# Patient Record
Sex: Male | Born: 1955 | Race: White | Hispanic: No | Marital: Married | Smoking: Never smoker
Health system: Southern US, Community
[De-identification: ages and names within clinical notes are randomized; demographics above are authoritative.]

## PROBLEM LIST (undated history)

## (undated) DIAGNOSIS — I209 Angina pectoris, unspecified: Secondary | ICD-10-CM

## (undated) DIAGNOSIS — J449 Chronic obstructive pulmonary disease, unspecified: Secondary | ICD-10-CM

## (undated) DIAGNOSIS — G4730 Sleep apnea, unspecified: Secondary | ICD-10-CM

## (undated) DIAGNOSIS — I4891 Unspecified atrial fibrillation: Secondary | ICD-10-CM

## (undated) DIAGNOSIS — I2510 Atherosclerotic heart disease of native coronary artery without angina pectoris: Secondary | ICD-10-CM

## (undated) DIAGNOSIS — I219 Acute myocardial infarction, unspecified: Secondary | ICD-10-CM

## (undated) DIAGNOSIS — M14669 Charcot's joint, unspecified knee: Secondary | ICD-10-CM

## (undated) DIAGNOSIS — I2699 Other pulmonary embolism without acute cor pulmonale: Secondary | ICD-10-CM

## (undated) DIAGNOSIS — I82409 Acute embolism and thrombosis of unspecified deep veins of unspecified lower extremity: Secondary | ICD-10-CM

## (undated) DIAGNOSIS — G629 Polyneuropathy, unspecified: Secondary | ICD-10-CM

## (undated) DIAGNOSIS — E785 Hyperlipidemia, unspecified: Secondary | ICD-10-CM

## (undated) DIAGNOSIS — R001 Bradycardia, unspecified: Secondary | ICD-10-CM

## (undated) DIAGNOSIS — Z9289 Personal history of other medical treatment: Secondary | ICD-10-CM

## (undated) DIAGNOSIS — I10 Essential (primary) hypertension: Secondary | ICD-10-CM

## (undated) DIAGNOSIS — E669 Obesity, unspecified: Secondary | ICD-10-CM

## (undated) DIAGNOSIS — R748 Abnormal levels of other serum enzymes: Secondary | ICD-10-CM

## (undated) DIAGNOSIS — I5030 Unspecified diastolic (congestive) heart failure: Secondary | ICD-10-CM

## (undated) DIAGNOSIS — M79606 Pain in leg, unspecified: Secondary | ICD-10-CM

## (undated) DIAGNOSIS — Z8719 Personal history of other diseases of the digestive system: Secondary | ICD-10-CM

## (undated) DIAGNOSIS — K219 Gastro-esophageal reflux disease without esophagitis: Secondary | ICD-10-CM

## (undated) HISTORY — PX: OTHER SURGICAL HISTORY: SHX169

## (undated) HISTORY — DX: Essential (primary) hypertension: I10

## (undated) HISTORY — DX: Personal history of other medical treatment: Z92.89

## (undated) HISTORY — DX: Other pulmonary embolism without acute cor pulmonale: I26.99

## (undated) HISTORY — DX: Polyneuropathy, unspecified: G62.9

## (undated) HISTORY — DX: Acute embolism and thrombosis of unspecified deep veins of unspecified lower extremity: I82.409

## (undated) HISTORY — PX: CORONARY ANGIOPLASTY WITH STENT PLACEMENT: SHX49

## (undated) HISTORY — DX: Charcot's joint, unspecified knee: M14.669

## (undated) HISTORY — PX: VASCULAR SURGERY: SHX849

## (undated) HISTORY — PX: CORONARY ARTERY BYPASS GRAFT: SHX141

## (undated) HISTORY — DX: Hyperlipidemia, unspecified: E78.5

## (undated) HISTORY — DX: Pain in leg, unspecified: M79.606

## (undated) HISTORY — DX: Atherosclerotic heart disease of native coronary artery without angina pectoris: I25.10

---

## 1991-02-14 DIAGNOSIS — E119 Type 2 diabetes mellitus without complications: Secondary | ICD-10-CM | POA: Insufficient documentation

## 2003-05-01 ENCOUNTER — Other Ambulatory Visit: Payer: Self-pay

## 2004-12-02 ENCOUNTER — Inpatient Hospital Stay: Payer: Self-pay | Admitting: Cardiology

## 2004-12-02 ENCOUNTER — Other Ambulatory Visit: Payer: Self-pay

## 2005-01-06 ENCOUNTER — Emergency Department: Payer: Self-pay | Admitting: Emergency Medicine

## 2007-05-30 ENCOUNTER — Inpatient Hospital Stay (HOSPITAL_COMMUNITY): Admission: EM | Admit: 2007-05-30 | Discharge: 2007-06-03 | Payer: Self-pay | Admitting: Emergency Medicine

## 2007-05-30 ENCOUNTER — Ambulatory Visit: Payer: Self-pay | Admitting: Cardiology

## 2007-05-30 ENCOUNTER — Ambulatory Visit: Payer: Self-pay

## 2007-05-31 ENCOUNTER — Encounter (INDEPENDENT_AMBULATORY_CARE_PROVIDER_SITE_OTHER): Payer: Self-pay | Admitting: Emergency Medicine

## 2007-06-05 ENCOUNTER — Ambulatory Visit: Payer: Self-pay | Admitting: Cardiology

## 2007-06-05 LAB — CONVERTED CEMR LAB
INR: 3.8 — ABNORMAL HIGH (ref 0.8–1.0)
Prothrombin Time: 38.8 s — ABNORMAL HIGH (ref 10.9–13.3)

## 2007-06-07 ENCOUNTER — Ambulatory Visit: Payer: Self-pay | Admitting: Internal Medicine

## 2007-06-12 ENCOUNTER — Ambulatory Visit: Payer: Self-pay | Admitting: Cardiology

## 2007-06-21 ENCOUNTER — Ambulatory Visit: Payer: Self-pay | Admitting: Cardiology

## 2007-06-25 ENCOUNTER — Ambulatory Visit: Payer: Self-pay | Admitting: Cardiology

## 2007-07-05 ENCOUNTER — Ambulatory Visit: Payer: Self-pay | Admitting: Cardiology

## 2007-07-29 ENCOUNTER — Ambulatory Visit: Payer: Self-pay | Admitting: Cardiology

## 2007-08-08 ENCOUNTER — Ambulatory Visit (HOSPITAL_COMMUNITY): Admission: RE | Admit: 2007-08-08 | Discharge: 2007-08-08 | Payer: Self-pay | Admitting: Cardiology

## 2007-08-12 ENCOUNTER — Ambulatory Visit: Payer: Self-pay | Admitting: Cardiology

## 2007-08-14 ENCOUNTER — Ambulatory Visit: Payer: Self-pay

## 2007-09-09 ENCOUNTER — Ambulatory Visit: Payer: Self-pay | Admitting: Cardiology

## 2007-09-23 ENCOUNTER — Ambulatory Visit: Payer: Self-pay | Admitting: Cardiology

## 2007-10-15 ENCOUNTER — Ambulatory Visit: Payer: Self-pay | Admitting: Cardiovascular Disease

## 2007-11-04 ENCOUNTER — Ambulatory Visit: Payer: Self-pay | Admitting: Cardiology

## 2007-12-10 ENCOUNTER — Ambulatory Visit: Payer: Self-pay | Admitting: Cardiovascular Disease

## 2007-12-16 ENCOUNTER — Ambulatory Visit: Payer: Self-pay | Admitting: Cardiology

## 2007-12-31 ENCOUNTER — Ambulatory Visit: Payer: Self-pay | Admitting: Cardiology

## 2008-01-21 ENCOUNTER — Ambulatory Visit: Payer: Self-pay | Admitting: Cardiology

## 2008-02-17 ENCOUNTER — Ambulatory Visit: Payer: Self-pay | Admitting: Cardiovascular Disease

## 2008-03-03 ENCOUNTER — Ambulatory Visit: Payer: Self-pay | Admitting: Cardiology

## 2008-03-05 ENCOUNTER — Ambulatory Visit: Payer: Self-pay | Admitting: Cardiology

## 2008-03-11 ENCOUNTER — Ambulatory Visit: Payer: Self-pay

## 2008-03-11 ENCOUNTER — Ambulatory Visit: Payer: Self-pay | Admitting: Cardiology

## 2008-03-11 LAB — CONVERTED CEMR LAB
ALT: 21 units/L (ref 0–53)
AST: 21 units/L (ref 0–37)
Albumin: 3.5 g/dL (ref 3.5–5.2)
Alkaline Phosphatase: 43 units/L (ref 39–117)
Bilirubin, Direct: 0.2 mg/dL (ref 0.0–0.3)
Cholesterol: 109 mg/dL (ref 0–200)
HDL: 30.1 mg/dL — ABNORMAL LOW (ref >39.0)
LDL Cholesterol: 58 mg/dL (ref 0–99)
Total Bilirubin: 0.7 mg/dL (ref 0.3–1.2)
Total CHOL/HDL Ratio: 3.6
Total Protein: 7 g/dL (ref 6.0–8.3)
Triglycerides: 105 mg/dL (ref 0–149)
VLDL: 21 mg/dL (ref 0–40)

## 2008-03-17 ENCOUNTER — Ambulatory Visit: Payer: Self-pay | Admitting: Cardiovascular Disease

## 2008-04-22 ENCOUNTER — Ambulatory Visit: Payer: Self-pay | Admitting: Internal Medicine

## 2008-05-12 ENCOUNTER — Ambulatory Visit: Payer: Self-pay | Admitting: Cardiovascular Disease

## 2008-05-26 DIAGNOSIS — E785 Hyperlipidemia, unspecified: Secondary | ICD-10-CM | POA: Insufficient documentation

## 2008-05-26 DIAGNOSIS — I119 Hypertensive heart disease without heart failure: Secondary | ICD-10-CM | POA: Insufficient documentation

## 2008-06-09 ENCOUNTER — Ambulatory Visit: Payer: Self-pay | Admitting: Cardiology

## 2008-06-18 ENCOUNTER — Ambulatory Visit: Payer: Self-pay | Admitting: Cardiology

## 2008-06-25 ENCOUNTER — Telehealth: Payer: Self-pay | Admitting: Cardiology

## 2008-06-30 ENCOUNTER — Ambulatory Visit: Payer: Self-pay | Admitting: Cardiology

## 2008-07-20 ENCOUNTER — Ambulatory Visit: Payer: Self-pay | Admitting: Cardiovascular Disease

## 2008-07-20 ENCOUNTER — Encounter: Payer: Self-pay | Admitting: Cardiology

## 2008-07-20 LAB — CONVERTED CEMR LAB
AntiThromb III Func: 83 % (ref 76–126)
Anticardiolipin IgA: 10 (ref <13)
Anticardiolipin IgG: 7 (ref <11)
Anticardiolipin IgM: <7 (ref <10)
Homocysteine: 11.9 micromoles/L (ref 4.0–15.4)
Protein S Ag, Total: 94 % (ref 70–140)

## 2008-09-08 ENCOUNTER — Encounter (INDEPENDENT_AMBULATORY_CARE_PROVIDER_SITE_OTHER): Payer: Self-pay | Admitting: *Deleted

## 2008-09-23 ENCOUNTER — Encounter: Payer: Self-pay | Admitting: Cardiology

## 2008-09-28 ENCOUNTER — Encounter: Payer: Self-pay | Admitting: *Deleted

## 2008-11-24 ENCOUNTER — Telehealth: Payer: Self-pay | Admitting: Cardiology

## 2009-07-20 ENCOUNTER — Ambulatory Visit: Payer: Self-pay | Admitting: Cardiology

## 2009-07-20 DIAGNOSIS — E875 Hyperkalemia: Secondary | ICD-10-CM | POA: Insufficient documentation

## 2010-01-05 ENCOUNTER — Telehealth: Payer: Self-pay | Admitting: Cardiology

## 2010-01-11 ENCOUNTER — Ambulatory Visit: Payer: Self-pay | Admitting: Cardiology

## 2010-02-15 ENCOUNTER — Ambulatory Visit: Admit: 2010-02-15 | Payer: Self-pay | Admitting: Cardiology

## 2010-03-07 ENCOUNTER — Encounter: Payer: Self-pay | Admitting: Cardiology

## 2010-03-07 ENCOUNTER — Telehealth: Payer: Self-pay | Admitting: Cardiology

## 2010-03-11 ENCOUNTER — Encounter: Payer: Self-pay | Admitting: Cardiology

## 2010-03-15 NOTE — Assessment & Plan Note (Signed)
Summary: rov. appt 4:45/ gd   Visit Type:  Follow-up Primary Provider:  Ramonita Lab MD  CC:  abdominal pain.  History of Present Illness: The patient presents for evaluation of abdominal discomfort. He says that he was doing some work and twisted in an abnormal way. He thought something. He has noticed some "lumps" under the skin in his right upper quadrant. These have been somewhat deep and firm without discoloration. He was worried that he could have cancer. He has not been having any nausea, vomiting or weight loss. He has had no change in his bowel habits or evidence of GI bleeding. He has had no chest pressure, neck or arm discomfort. He has had no palpitations, presyncope or syncope. His lower extremity edema is actually improved compared with previous.  Current Medications (verified): 1)  Crestor 10 Mg Tabs (Rosuvastatin Calcium) .... Take 1 Tablet By Mouth Once A Day 2)  Metoprolol Succinate 25 Mg Xr24h-Tab (Metoprolol Succinate) .... 1/2 Tablet By Mouth Every Day 3)  Warfarin Sodium 10 Mg Tabs (Warfarin Sodium) .... Take 1 Tablet By Mouth Once A Day 4)  Lantus 100 Unit/ml Soln (Insulin Glargine) .... Uad 5)  Hydrochlorothiazide 25 Mg Tabs (Hydrochlorothiazide) .... Take One Tablet By Mouth Daily. 6)  Humalog 100 Unit/ml Soln (Insulin Lispro (Human)) .... Uad 7)  Cozaar 50 Mg Tabs (Losartan Potassium) .... Take One Tablet By Mouth Daily 8)  Fish Oil   Oil (Fish Oil) .Marland Kitchen.. 1 Tab Two Times A Day 9)  Nitroglycerin 0.4 Mg Subl (Nitroglycerin) .... One Tablet Under Tongue Every 5 Minutes As Needed For Chest Pain---May Repeat Times Three 10)  Nexium 40 Mg Cpdr (Esomeprazole Magnesium) .... As Needed  Allergies (verified): 1)  ! * Bee Stings  Past History:  Past Medical History:    Reviewed history from 05/26/2008 and no changes required:     1. Coronary artery disease status post CABG (I do not have the full          details of this.  He had an occluded saphenous vein graft to OM,        PDA after CABG.  He had a Cypher stent to the right coronary          artery.  This is apparently a 4-vessel CABG in 2002).      2. Hypertension x15 years.      3. Dyslipidemia.      4. Diabetes mellitus.      5. Peripheral neuropathy.      6. Charcot joint.      7. Pulmonary embolism.      8. Right lower extremity deep venous thrombosis.   Past Surgical History:    Reviewed history from 05/26/2008 and no changes required:    Left knee surgery    CABG times 4 in 2002.   Review of Systems       As stated in the HPI and negative for all other systems.   Vital Signs:  Patient profile:   55 year old male Height:      72 inches Weight:      263 pounds BMI:     35.80 Pulse rate:   62 / minute BP sitting:   124 / 80  (right arm)  Vitals Entered By: Margaretmary Bayley CMA (Jun 30, 2008 5:17 PM)  Physical Exam  General:  Well developed, well nourished, in no acute distress. Head:  normocephalic and atraumatic Eyes:  PERRLA/EOM intact; conjunctiva and lids normal. Mouth:  Teeth, gums and palate normal. Oral mucosa normal. Neck:  Neck supple, no JVD. No masses, thyromegaly or abnormal cervical nodes. Chest Wall:  Well healed sternotomy scar Lungs:  Clear bilaterally to auscultation and percussion. Abdomen:  Bowel sounds positive; abdomen soft and non-tender without masses, organomegaly, or hernias noted. No hepatosplenomegaly, morbidly obese Msk:  Back normal, normal gait. Muscle strength and tone normal. Pulses:  pulses normal in all 4 extremities Extremities:  chronic lower extremity venous stasis changes, mild bilateral edema Neurologic:  Alert and oriented x 3. Skin:  Intact without lesions or rashes. Cervical Nodes:  no significant adenopathy Axillary Nodes:  no significant adenopathy Inguinal Nodes:  no significant adenopathy Psych:  Normal affect.   Detailed Cardiovascular Exam  Neck    Carotids: Carotids full and equal bilaterally without bruits.      Neck Veins:  Normal, no JVD.    Heart    Inspection: no deformities or lifts noted.      Palpation: normal PMI with no thrills palpable.      Auscultation: regular rate and rhythm, S1, S2 without murmurs, rubs, gallops, or clicks.    Vascular    Abdominal Aorta: no palpable masses, pulsations, or audible bruits.      Femoral Pulses: normal femoral pulses bilaterally.      Pedal Pulses: normal pedal pulses bilaterally.      Radial Pulses: normal radial pulses bilaterally.      Peripheral Circulation: no clubbing, cyanosis, or edema noted with normal capillary refill.     Impression & Recommendations:  Problem # 1:  ABDOMINAL PAIN, UNSPECIFIED SITE (ICD-789.00) The patient has abdominal pain related to "pulling something" when he was doing some work. I do not appreciate any subcutaneous nodules, hematoma, ecchymosis or other abdominal findings. I suspect he may hold the muscle leaving a slight bleed since he is on Coumadin. However, this should get better with time. If not I have asked him to discuss this Dr.Klein.  Problem # 2:  DVT (ICD-453.40) The patient has now been on Coumadin for over a year and can discontinue this. I will check a hypercoagulable panel.  Problem # 3:  CAD (ICD-414.00) He had a negative stress perfusion study in January. He will continue with risk reduction. Orders: EKG w/ Interpretation (93000)  Problem # 4:  DYSLIPIDEMIA (ICD-272.4) Per his primary care doctor. The goal should be an LDL less than 70 and HDL greater than 40. He is not at target with his HDL but had an LDL of 58 earlier this year. He understands the need for more exercise to raise his HDL. His updated medication list for this problem includes:    Crestor 10 Mg Tabs (Rosuvastatin calcium) .Marland Kitchen... Take 1 tablet by mouth once a day  Problem # 5:  HYPERTENSION (ICD-401.9) His blood pressure is controlled and he will continue the medications as listed.  Patient Instructions: 1)  Your physician recommends that  you schedule a follow-up appointment in: 4 months 2)  Your physician recommends that you return for lab work in:  1 week have a hypercoag panel 3)  Your physician has recommended you make the following change in your medication: Stop Coumadin

## 2010-03-15 NOTE — Letter (Signed)
Summary: Appointment - Reminder [facility], [place]  [address] [place]   [place], [place] [address]   Phone: [phone]  Fax: [phone]     September 08, 2008 MRN: [id number]   [facility] [address] [place], [place]  [address]   Dear Mr. MOGA,  [facility] records indicate that it is time to schedule a follow-up appointment.  Dr.HOCHREIN recommended that you follow up with [place] in SEPTEMBER 2010. It is very important that we reach you to schedule this appointment. We look forward to participating in your health care needs. Please contact us at the number listed above at your earliest convenience to schedule your appointment.  If you are unable to make an appointment at this time, give [place] a call so we can update our records.     Sincerely, GESILA,DAVIS  Public relations account executive

## 2010-03-15 NOTE — Progress Notes (Signed)
Summary: rx metoprolol  Phone Note Call from Patient Call back at [phone]   Caller: Patient Reason for Call: Talk to Nurse Summary of Call: talk to nurse about meds... Initial call taken by: Darnell Level,  November 24, 2008 2:32 PM  Follow-up for Phone Call        ? about metoprolol,  metoprolol 25mg  daily sent to [place], CNA  November 24, 2008 2:54 PM  Follow-up by: Levora Angel, CNA,  November 24, 2008 2:54 PM    New/Updated Medications: METOPROLOL SUCCINATE 25 MG XR24H-TAB (METOPROLOL SUCCINATE) 1 tablet by mouth every day Prescriptions: METOPROLOL SUCCINATE 25 MG XR24H-TAB (METOPROLOL SUCCINATE) 1 tablet by mouth every day  #30 x 2   Entered by:   Levora Angel, CNA   Authorized by:   Minus Breeding, MD, [facility]   Signed by:   Levora Angel, CNA on 11/24/2008   Method used:   Electronically to        [facility] [phone]* (retail)       [place], [place]  [address]       Ph: [phone]       Fax: [phone]   RxID:   [id number]

## 2010-03-15 NOTE — Assessment & Plan Note (Signed)
Summary: 9 vitamin   Visit Type:  Follow-up Primary Provider:  Ramonita Lab MD  CC:  CAD.  History of Present Illness: The patient presents for followup of his known coronary disease. Since I last saw him he was taken off of Coumadin after a hypercoagulable panel came back with no abnormalities. He's had no further problems with obvious clotting though he has continued right lower extremity swelling that comes and goes. It is much proved compared with the presentation at which time he was found to have a DVT.  He  Did have one episode ofchest discomfort about 2 months ago that required a couple of nitroglycerin. Otherwise he is no longer having the chest discomfort that he had at time of bypass and stenting. His last catheterization in 2006. He has had no chest pressure, neck or arm discomfort. He is not having palpitations, presyncope or syncope. He is not having any PND or orthopnea. Unfortunately he says his sugars have been high and he hasn't seen his endocrinologist recently. He actually stopped taking Crestor when he ran out about 4 weeks ago. He doesn't know her lipid profile. He thinks he has lost a few pounds. He is working but doesn't exercise. He is not obviously watching his diet.   Current Medications (verified): 1)  Crestor 10 Mg Tabs (Rosuvastatin Calcium) .... Out 2)  Metoprolol Succinate 25 Mg Xr24h-Tab (Metoprolol Succinate) .Marland Kitchen.. 1 Tablet By Mouth Every Day 3)  Lantus 100 Unit/ml Soln (Insulin Glargine) .... Uad 4)  Hydrochlorothiazide 25 Mg Tabs (Hydrochlorothiazide) .... Take One Tablet By Mouth Daily. 5)  Humalog 100 Unit/ml Soln (Insulin Lispro (Human)) .... Uad 6)  Cozaar 50 Mg Tabs (Losartan Potassium) .... Take One Tablet By Mouth Daily 7)  Fish Oil   Oil (Fish Oil) .Marland Kitchen.. 1 Tab Two Times A Day 8)  Nitroglycerin 0.4 Mg Subl (Nitroglycerin) .... One Tablet Under Tongue Every 5 Minutes As Needed For Chest Pain---May Repeat Times Three 9)  Nexium 40 Mg Cpdr (Esomeprazole  Magnesium) .... As Needed  Allergies (verified): 1)  ! * Bee Stings  Past History:  Past Medical History: Reviewed history from 05/26/2008 and no changes required.  1. Coronary artery disease status post CABG (I do not have the full       details of this.  He had an occluded saphenous vein graft to OM,       PDA after CABG.  He had a Cypher stent to the right coronary       artery.  This is apparently a 4-vessel CABG in 2002).   2. Hypertension x15 years.   3. Dyslipidemia.   4. Diabetes mellitus.   5. Peripheral neuropathy.   6. Charcot joint.   7. Pulmonary embolism.   8. Right lower extremity deep venous thrombosis.   Past Surgical History: Reviewed history from 05/26/2008 and no changes required. Left knee surgery CABG times 4 in 2002.   Review of Systems       As stated in the HPI and negative for all other systems.   Vital Signs:  Patient profile:   55 year old male Height:      72 inches Weight:      263 pounds BMI:     35.80 Pulse rate:   63 / minute Resp:     18 per minute BP sitting:   138 / 84  (right arm)  Vitals Entered By: Levora Angel, CNA (July 20, 2009 10:47 AM)  Physical Exam  General:  Well developed, well nourished, in no acute distress. Head:  normocephalic and atraumatic Eyes:  PERRLA/EOM intact; conjunctiva and lids normal. Mouth:  Teeth, gums and palate normal. Oral mucosa normal. Neck:  Neck supple, no JVD. No masses, thyromegaly or abnormal cervical nodes. Chest Wall:  Well healed sternotomy scar Lungs:  Clear bilaterally to auscultation and percussion. Abdomen:  Bowel sounds positive; abdomen soft and non-tender without masses, organomegaly, or hernias noted. No hepatosplenomegaly, morbidly obese Msk:  Back normal, normal gait. Muscle strength and tone normal. Extremities:  chronic lower extremity venous stasis changes, mild bilateral edema Neurologic:  Alert and oriented x 3. Skin:  Intact without lesions or rashes. Cervical Nodes:   no significant adenopathy Axillary Nodes:  no significant adenopathy Psych:  Normal affect.   Detailed Cardiovascular Exam  Neck    Carotids: Carotids full and equal bilaterally without bruits.      Neck Veins: Normal, no JVD.    Heart    Inspection: no deformities or lifts noted.      Palpation: normal PMI with no thrills palpable.      Auscultation: regular rate and rhythm, S1, S2 without murmurs, rubs, gallops, or clicks.    Vascular    Abdominal Aorta: no palpable masses, pulsations, or audible bruits.      Femoral Pulses: normal femoral pulses bilaterally.      Pedal Pulses: pulses normal in all 4 extremities    Radial Pulses: normal radial pulses bilaterally.      Peripheral Circulation: no clubbing, cyanosis, or edema noted with normal capillary refill.     EKG  Procedure date:  07/20/2009  Findings:      Sinus rhythm, rate 65,axis within normal limits, intervals within normal limits, old inferior infarct, no acute ST-T wave changes.  Impression & Recommendations:  Problem # 1:  CAD (ICD-414.00) The patient has had no new symptoms. No further cardiovascular testing is suggested. He needs better secondary risk reduction. Orders: EKG w/ Interpretation (93000)  Problem # 2:  DYSLIPIDEMIA (ICD-272.4) I will restart his Crestor. I have ordered a lipid profile in 8 weeks. The goal with his ongoing risk factors should be an LDL less than 70 and HDL greater than 40. Orders: EKG w/ Interpretation (93000)  Problem # 3:  HYPERTENSION (ICD-401.9) His blood pressure is controlled and he will continue the meds as listed. Orders: EKG w/ Interpretation (93000)  Problem # 4:  DM (ICD-250.00) I have encouraged him to followup and get an appointment with his endocrinologist and described to him the importance of better blood sugar control.  Problem # 5:  OBESITY, UNSPECIFIED (ICD-278.00) I have discussed with him the importance of weight loss again.  Patient  Instructions: 1)  Your physician recommends that you schedule a follow-up appointment in:  2)  Your physician has recommended you make the following change in your medication: restart Crestor 3)  Your physician recommends that you have a FASTING lipid profile: in 8 weeks  ( may have done in [place]) Prescriptions: CRESTOR 10 MG TABS (ROSUVASTATIN CALCIUM) one daily  #30 x 11   Entered by:   Sim Boast, RN   Authorized by:   Minus Breeding, MD, [facility]   Signed by:   Sim Boast, RN on 07/20/2009   Method used:   Electronically to        [facility] [phone]* (retail)       [place], [place]  [address]  Ph: [phone]       Fax: [phone]   RxIDNF:[id number]

## 2010-03-15 NOTE — Letter (Signed)
Summary: [facility] Note  [facility] Note   Imported By: Sallee Provencal 10/15/2008 15:14:40  _____________________________________________________________________  External Attachment:    Type:   Image     Comment:   External Document

## 2010-03-15 NOTE — Progress Notes (Signed)
Summary: PT REQUEST CALL  Phone Note Call from Patient Call back at Home Phone [phone] Call back at [phone] Message from:  Patient on January 05, 2010 8:34 AM  Caller: Patient Reason for Call: Talk to Nurse Initial call taken by: Delsa Sale,  January 05, 2010 8:36 AM  Follow-up for Phone Call        I called and spoke with the pt. He thought he was due for an appt with Dr. Percival Spanish. I explained he is not overdue- he is not due until 6/12. He states he would like to go ahead and schedule an appt with Dr. Percival Spanish to discuss his medications. He was offered a first available on 02/08/10, but preferred to wait until the new year. He is scheduled for 02/15/10. The pt states he stopped Crestor about a month or two ago due to muscle soreness. He has only noticed a little improvement in this. He is also concerned about some of the blood pressure medications he is taking, but the reason why is a little unclear to me after speaking with him. He is overdue for labwork. He will go to the [place] office next week for followup labwork to see what his lipid status is. We will also check a CK level and bmet. The pt is agreeable with this plan. Follow-up by: Alvis Lemmings, RN, BSN,  January 05, 2010 8:46 AM     Appended Document: PT REQUEST CALL    Clinical Lists Changes  Medications: Rx of COZAAR 50 MG TABS (LOSARTAN POTASSIUM) Take one tablet by mouth daily;  #30 x 6;  Signed;  Entered by: Alvis Lemmings, RN, BSN;  Authorized by: Minus Breeding, MD, [facility];  Method used: Electronically to [facility] [phone]*, [address] [place], [place]  [address], Ph: [phone], Fax: [phone]    Prescriptions: COZAAR 50 MG TABS (LOSARTAN POTASSIUM) Take one tablet by mouth daily  #30 x 6   Entered by:   Alvis Lemmings, RN, BSN   Authorized by:   Minus Breeding, MD, [facility]   Signed by:   Alvis Lemmings, RN, BSN on 01/05/2010   Method used:   Electronically to        [facility] [phone]* (retail)       [place], [place]  [address]       Ph: [phone]       Fax: [phone]   RxID:   [id number]

## 2010-03-17 NOTE — Progress Notes (Signed)
Summary: PT [place]  Phone Note Call from Patient Call back at Home Phone [phone] Call back at CE[facility] [phone]   Caller: Patient Reason for Call: Talk to Nurse Summary of Call: PT [place] Initial call taken by: Regan Lemming,  March 07, 2010 3:43 PM  Follow-up for Phone Call        pt requesting orders for bloodwork be faxed to [facility] at [phone].  Orders for BMP,CK and fasting lipid to be faxed Follow-up by: Sim Boast, RN,  March 07, 2010 4:23 PM

## 2010-03-17 NOTE — Letter (Signed)
Summary: lab order bmp, ck and lipid  pfh,rn  [facility] [place]  [address] [place]   [place], [place] [address]   Phone: [phone]  Fax: [phone]            March 07, 2010 MRN: [id number]    RE:   Bobby Lindsey   [address]   [place], [place]  [address]   DOB 1955-07-19    Bobby Lindsey needs a fasting lipid panel, CK and BMP for 272.2, 401.9 and v58.69 with results faxed to [phone] ATTN:  Pam         Sincerely,      V.O. Dr Jeneen Rinks Yamila Cragin/Pamela Fleming-Hayes, RN   This letter has been electronically signed by your physician.

## 2010-03-21 ENCOUNTER — Ambulatory Visit (INDEPENDENT_AMBULATORY_CARE_PROVIDER_SITE_OTHER): Payer: PRIVATE HEALTH INSURANCE | Admitting: Cardiology

## 2010-03-21 ENCOUNTER — Encounter: Payer: Self-pay | Admitting: Cardiology

## 2010-03-21 DIAGNOSIS — E669 Obesity, unspecified: Secondary | ICD-10-CM

## 2010-03-21 DIAGNOSIS — E7800 Pure hypercholesterolemia, unspecified: Secondary | ICD-10-CM

## 2010-03-21 DIAGNOSIS — I2510 Atherosclerotic heart disease of native coronary artery without angina pectoris: Secondary | ICD-10-CM

## 2010-03-30 ENCOUNTER — Other Ambulatory Visit (HOSPITAL_COMMUNITY): Payer: PRIVATE HEALTH INSURANCE

## 2010-03-31 ENCOUNTER — Telehealth (INDEPENDENT_AMBULATORY_CARE_PROVIDER_SITE_OTHER): Payer: Self-pay | Admitting: Radiology

## 2010-03-31 NOTE — Assessment & Plan Note (Signed)
Summary: rov/PER PT CALL/REQUEST APPT TO DISCUSS MEDS/ SEE 01/05/10 PH...   Visit Type:  Follow-up Primary Provider:  Ramonita Lab MD  CC:  CAD.  History of Present Illness: The patient presents for follow up of his known coronary disease. Since I last saw him he said no acute complaints. However, it is very clear that he does not participate in secondary risk reduction. We went through his diet and last night he had a toasted cheese sandwich and lemon pie. He does not report any chest pressure, neck or arm discomfort. He does not report palpitations, presyncope or syncope. He has had no weight gain or edema. However, he is not overly active. He has long-standing diabetes and thinks that his last hemoglobin A1c was 8. I did review his last lipid done in January and his LDL was 100 with an HDL of 47. This was off of Crestor which was stopped because his CK was elevated. Actually his CK in January was elevated despite not being on the Crestor for 6 months. He does describe a muscle and joint aches but these have been long term.  Current Medications (verified): 1)  Metoprolol Succinate 25 Mg Xr24h-Tab (Metoprolol Succinate) .Marland Kitchen.. 1 Tablet By Mouth Every Day 2)  Lantus 100 Unit/ml Soln (Insulin Glargine) .... Uad 3)  Hydrochlorothiazide 25 Mg Tabs (Hydrochlorothiazide) .... Take One Tablet By Mouth Daily. 4)  Humalog 100 Unit/ml Soln (Insulin Lispro (Human)) .... Uad 5)  Cozaar 50 Mg Tabs (Losartan Potassium) .... Take One Tablet By Mouth Daily 6)  Fish Oil   Oil (Fish Oil) .Marland Kitchen.. 1 Tab Two Times A Day 7)  Nitroglycerin 0.4 Mg Subl (Nitroglycerin) .... One Tablet Under Tongue Every 5 Minutes As Needed For Chest Pain---May Repeat Times Three 8)  Nexium 40 Mg Cpdr (Esomeprazole Magnesium) .... As Needed 9)  Mucinex Dm 30-600 Mg Xr12h-Tab (Dextromethorphan-Guaifenesin) .... Uad 10)  Benzonatate 200 Mg Caps (Benzonatate) .... As Needed  Allergies (verified): 1)  ! * Bee Stings  Past History:  Past  Medical History: Reviewed history from 05/26/2008 and no changes required.  1. Coronary artery disease status post CABG (I do not have the full       details of this.  He had an occluded saphenous vein graft to OM,       PDA after CABG.  He had a Cypher stent to the right coronary       artery.  This is apparently a 4-vessel CABG in 2002).   2. Hypertension x15 years.   3. Dyslipidemia.   4. Diabetes mellitus.   5. Peripheral neuropathy.   6. Charcot joint.   7. Pulmonary embolism.   8. Right lower extremity deep venous thrombosis.   Past Surgical History: Reviewed history from 05/26/2008 and no changes required. Left knee surgery CABG times 4 in 2002.   Review of Systems       As stated in the HPI and negative for all other systems except mild dizziness which she has noted since starting over-the-counter cold medicines for the last few days.   Vital Signs:  Patient profile:   55 year old male Height:      72 inches Weight:      266 pounds BMI:     36.21 Pulse rate:   66 / minute Pulse (ortho):   64 / minute Resp:     16 per minute BP sitting:   138 / 80  (right arm) BP standing:   127 / 75  Vitals  Entered By: Levora Angel, CNA (March 21, 2010 3:12 PM)  Serial Vital Signs/Assessments:  Time      Position  BP       Pulse  Resp  Temp     By 4:41 PM   Lying LA  139/82   60                    Thompson Grayer, RN, BSN 4:41 PM   Sitting   121/76   63                    Thompson Grayer, RN, BSN 4:41 PM   Standing  127/75   Blanco, RN, BSN  Comments: 4:41 PM Dizzy when going from sitting to standing.  Dizziness continues when standing. After standing 2 minutes--133/78-66. After standing 5 minutes--132/73--66 By: Thompson Grayer, RN, BSN    Physical Exam  General:  Well developed, well nourished, in no acute distress. Head:  normocephalic and atraumatic Eyes:  PERRLA/EOM intact; conjunctiva and lids normal. Mouth:  Teeth,  gums and palate normal. Oral mucosa normal. Neck:  Neck supple, no JVD. No masses, thyromegaly or abnormal cervical nodes. Chest Wall:  Well healed sternotomy scar Lungs:  Clear bilaterally to auscultation and percussion. Abdomen:  Bowel sounds positive; abdomen soft and non-tender without masses, organomegaly, or hernias noted. No hepatosplenomegaly, morbidly obese Msk:  Back normal, normal gait. Muscle strength and tone normal. Skin:  Intact without lesions or rashes. Cervical Nodes:  no significant adenopathy Axillary Nodes:  no significant adenopathy Inguinal Nodes:  no significant adenopathy Psych:  Normal affect.   Detailed Cardiovascular Exam  Neck    Carotids: Carotids full and equal bilaterally without bruits.      Neck Veins: Normal, no JVD.    Heart    Inspection: no deformities or lifts noted.      Palpation: normal PMI with no thrills palpable.      Auscultation: regular rate and rhythm, S1, S2 without murmurs, rubs, gallops, or clicks.    Vascular    Abdominal Aorta: no palpable masses, pulsations, or audible bruits.      Femoral Pulses: normal femoral pulses bilaterally.      Pedal Pulses: pulses normal in all 4 extremities    Radial Pulses: normal radial pulses bilaterally.      Peripheral Circulation: no clubbing, cyanosis, or edema noted with normal capillary refill.     EKG  Procedure date:  03/21/2010  Findings:      Sus rhythm, rate 67, axis within normal limits, intervals within normal limits, Lateral ST depression in one and aVL, consider ischemia  Impression & Recommendations:  Problem # 1:  CAD (ICD-414.00) The patient has coronary disease with 56 year old bypass grafts. He does not participate personally in risk reduction. He is relatively sedentary. Given all of this he needs to be followed and screened with a stress test. Unfortunately he could not walk on a treadmill with his neuropathy and Charcot joints. Therefore, he will need a pharmacologic  perfusion study. He does have lateral T wave inversions with an abnormal EKG Orders: EKG w/ Interpretation (93000) Nuclear Stress Test (Nuc Stress Test)  Problem # 2:  OBESITY, UNSPECIFIED (ICD-278.00) We had another long discussion about this and I gave cystic recommendations on diet  Problem # 3:  HYPERTENSION (ICD-401.9) His blood  pressure is controlled and he will continue medications as listed.  Problem # 4:  DYSLIPIDEMIA (ICD-272.4) he is no longer taking the Crestor because of muscle aches. I would very much like him to be on a statin with an LDL less than 70. I might prescribe pravastatin but I will check a CK level first. I don't think he can be on this if his CK is still elevated.  Patient Instructions: 1)  Your physician recommends that you schedule a follow-up appointment in: 6 months 2)  Your physician has requested that you have an adenosine myoview.  For further information please visit [email or web address].  Please follow instruction sheet, as given. 3)  Your physician recommends that you continue on your current medications as directed. Please refer to the Current Medication list given to you today.

## 2010-04-04 ENCOUNTER — Encounter: Payer: Self-pay | Admitting: Internal Medicine

## 2010-04-04 ENCOUNTER — Ambulatory Visit (HOSPITAL_COMMUNITY): Payer: PRIVATE HEALTH INSURANCE | Attending: Internal Medicine

## 2010-04-04 DIAGNOSIS — I2510 Atherosclerotic heart disease of native coronary artery without angina pectoris: Secondary | ICD-10-CM | POA: Insufficient documentation

## 2010-04-04 DIAGNOSIS — R0609 Other forms of dyspnea: Secondary | ICD-10-CM

## 2010-04-04 DIAGNOSIS — R0989 Other specified symptoms and signs involving the circulatory and respiratory systems: Secondary | ICD-10-CM

## 2010-04-06 NOTE — Progress Notes (Signed)
Summary: Nuclear Pre-Procedure  Phone Note Outgoing Call Call back at [facility] Phone [phone]   Call placed by: Eliezer Lofts, EMT-P,  March 31, 2010 1:11 PM Call placed to: Patient Reason for Call: Confirm/change Appt Summary of Call: Left part message with information on Myoview Information Sheet (see scanned document for details), before the machine hung up, and on call back, the messages were all full. Eliezer Lofts, EMT-P  March 31, 2010 1:11 PM      Nuclear Med Background Indications for Stress Test: Evaluation for Ischemia, Stent Patency   History: CABG, Echo, Myocardial Perfusion Study, Stents  History Comments: '02 CABGx4; '07 Stent-RCA; '09 Echo-EF=60%; 1/10 MPS - Inf. scar / mild P.I. isch./ EF=59% ; OSA     Nuclear Pre-Procedure Cardiac Risk Factors: Hypertension, IDDM Type 2, Lipids Height (in): 59  Nuclear Med Study Referring MD:  Minus Breeding MD

## 2010-04-07 ENCOUNTER — Telehealth: Payer: Self-pay | Admitting: Cardiology

## 2010-04-12 ENCOUNTER — Other Ambulatory Visit: Payer: PRIVATE HEALTH INSURANCE

## 2010-04-12 NOTE — Assessment & Plan Note (Signed)
Summary: Cardiology Nuclear Testing  Nuclear Med Background Indications for Stress Test: Evaluation for Ischemia, Stent Patency   History: CABG, Echo, Myocardial Perfusion Study, Stents  History Comments: '02 CABGx4; '07 Stent-RCA; '09 Echo-EF=60%; 1/10 MPS - Inf. scar / mild P.I. isch./ EF=59% ; OSA     Nuclear Pre-Procedure Cardiac Risk Factors: Hypertension, IDDM Type 2, Lipids Caffeine/Decaff Intake: None NPO After: 11:00 PM Lungs:  clear IV 0.9% NS with Angio Cath: 18g     IV Site: R Antecubital IV Started by: Eliezer Lofts, EMT-P Chest Size (in) 52     Height (in): 72 Weight (lb): 269 BMI: 36.61 Tech Comments: Metoprolol held this am, per patient.  Nuclear Med Study 1 or 2 day study:  1 day     Stress Test Type:  Carlton Adam Reading MD:  Dorris Carnes, MD     Referring MD:  Minus Breeding MD Resting Radionuclide:  Technetium 33m Tetrofosmin     Resting Radionuclide Dose:  10.7 mCi  Stress Radionuclide:  Technetium 65m Tetrofosmin     Stress Radionuclide Dose:  33.0 mCi   Stress Protocol   Lexiscan: 0.4 mg   Stress Test Technologist:  Perrin Maltese, EMT-P     Nuclear Technologist:  Charlton Amor, CNMT  Rest Procedure  Myocardial perfusion imaging was performed at rest 45 minutes following the intravenous administration of Technetium 52m Tetrofosmin.  Stress Procedure  The patient received IV Lexiscan 0.4 mg over 15-seconds.  Technetium 33m Tetrofosmin injected at 30-seconds.  There were no significant changes with infusion.  Quantitative spect images were obtained after a 45 minute delay.  QPS Raw Data Images:  Images were motion corrected.  Soft tissue (diaphragm, subcutaneous fat) surround heart. Stress Images:  Defect in the inferior wall (base, mid)and apex.  Otherwise normal perfusion. Rest Images:  Minimal change from the stress images. Subtraction (SDS):  No evidence of ischemia. Transient Ischemic Dilatation:  1.02  (Normal <1.22)  Lung/Heart Ratio:  0.36   (Normal <0.45)  Quantitative Gated Spect Images QGS EDV:  133 ml QGS ESV:  70 ml QGS EF:  47 %   Overall Impression  Exercise Capacity: Lexiscan with no exercise. BP Response: Normal blood pressure response. Clinical Symptoms: No chest pain ECG Impression: No significant ST segment change suggestive of ischemia. Overall Impression: Probable soft tissue attenuation (diaphragm, bowel), cannot exclude inferior subendocaridal scall.  No evidence for ischemia.  LVEF calculated at 47%.  Consider echo to further evaluate LV function.  Appended Document: Cardiology Nuclear Testing No evidence of ischemia or infarct.  Continue risk reduction.  Appended Document: Cardiology Nuclear Testing pt aware of results

## 2010-04-12 NOTE — Progress Notes (Signed)
Summary: Increased SOB  order for 2 D Echo and BNP  Phone Note Outgoing Call   Call placed by: Sim Boast, RN,  April 07, 2010 12:54 PM Call placed to: Patient Details for Reason: called pt with results of myoview - he c/o SOB Summary of Call: called pt to discuss results of his nuclear med study.  while talking -  pt c/o an increase in SOB over the past 2 to 3 weeks.  States he mostly has it with walking but has also noticed it at rest.  He denies edema.  States SOB is alittle like before when he had a blood clot in his leg years ago.  He denies any leg pain.  Will review with Dr Percival Spanish and call pt back with instructions Initial call taken by: Sim Boast, RN,  April 07, 2010 12:58 PM  Follow-up for Phone Call        Check an echo and BNP to begin with. Follow-up by: Minus Breeding, MD, [facility],  April 07, 2010 1:51 PM  Additional Follow-up for Phone Call Additional follow up Details #1::        Order placed for pt to 2 D Echo and BNP.  pt aware he will be called for appt. Additional Follow-up by: Sim Boast, RN,  April 07, 2010 1:58 PM  New Problems: SHORTNESS OF BREATH (ICD-786.05)   New Problems: SHORTNESS OF BREATH (ICD-786.05)

## 2010-06-04 ENCOUNTER — Other Ambulatory Visit: Payer: Self-pay | Admitting: *Deleted

## 2010-06-04 MED ORDER — HYDROCHLOROTHIAZIDE 25 MG PO TABS: 25.0000 mg | ORAL_TABLET | Freq: Every day | ORAL | Status: DC

## 2010-06-09 ENCOUNTER — Telehealth: Payer: Self-pay | Admitting: Cardiology

## 2010-06-09 NOTE — Telephone Encounter (Signed)
Pt wants to talk to pam re some blood work and ultrasound.

## 2010-06-10 NOTE — Telephone Encounter (Signed)
Pt just now calling back about getting scheduled for his echo and BNP.  Orders were placed and the pt was called to be scheduled.  Pt states he is having some SOB when he works a lot.  He would like to have testing scheduled now.  Information taken to [facility] to schedule pt and call with appts.

## 2010-06-28 NOTE — Assessment & Plan Note (Signed)
[facility] OFFICE NOTE   NAME:Bobby Lindsey, Bobby Lindsey                        MRN:          [id number]  DATE:12/16/2007                            DOB:          [birth year]    PRIMARY CARE PHYSICIAN:  Tama High, MD   REASON FOR PRESENTATION:  Evaluate the patient with coronary artery  disease and pulmonary embolism.   HISTORY OF PRESENT ILLNESS:  The patient is now 55 years old.  He  presents for followup of the above.  Since I last saw him, he has  developed another small ulcer on his right foot.  He is having this  managed apparently by a podiatrist.  He has had no new cardiovascular  symptoms.  He still goes to work.  He has dyspnea with moderate  exertion, but not with mild exertion and not at rest.  He is not  describing any PND or orthopnea.  He has not been having any chest  discomfort, neck or arm discomfort.  He has had no palpitation,  presyncope, or syncope.  He has had none of the swelling that he had at  the time of his DVT.  He is following at [facility] for his diabetes, but  admits that his diabetes is not well controlled.  He has no dietary  control.   Of note also, the patient has been found to have sleep apnea, but has  not yet been treated for this.   PAST MEDICAL HISTORY:  Coronary artery disease status post CABG, (I do  not have the full details of this.  He had an occluded saphenous vein  graft to an OM and a PDA.  He had Cypher stenting to the right coronary  artery.  There was apparently a 4-vessel bypass in 2002), hypertension  x15 years, dyslipidemia, diabetes mellitus, peripheral neuropathy,  Charcot joint, right lower extremity deep venous thrombosis, pulmonary  embolism, and left knee surgery.   ALLERGIES/INTOLERANCES:  BEE STINGS.   MEDICATIONS:  1. Aspirin 81 mg daily.  2. Cozaar 75 mg daily.  3. Fish oil.  4. Humalog.  5. Hydrochlorothiazide 25 mg daily.  6. Lantus.  7. Crestor 10  mg daily.  8. Toprol 25 mg daily.  9. Coumadin.   REVIEW OF SYSTEMS:  As stated in the HPI and positive for erectile  dysfunction.  Negative for all other systems.   PHYSICAL EXAMINATION:  GENERAL:  The patient is in no distress.  VITAL SIGNS:  Blood pressure 125/75, heart rate 63 and regular, weight  264 pounds, and body mass index 36.  HEENT:  Eyelids unremarkable; pupils equal, round, and reactive to  light; fundi not visualized; oral mucosa unremarkable.  NECK:  No jugular venous distention at 45 degrees; carotid upstroke  brisk and symmetrical; no bruits, no thyromegaly.  LYMPHATICS:  No cervical, axillary, or inguinal adenopathy.  LUNGS:  Clear to auscultation bilaterally.  BACK:  No costovertebral angle tenderness.  CHEST:  Well-healed sternotomy scar.  HEART:  PMI not displaced or sustained; S1 and S2 within normal limits;  no S3, no  S4, no clicks, rubs, or murmurs.  ABDOMEN:  Obese; positive bowel sounds, normal in frequency and pitch;  no bruits, rebound, guarding, or midline pulsatile mass; no  hepatomegaly, no splenomegaly.  SKIN:  No rashes, no nodules.  EXTREMITIES:  Pulses 2+ throughout; no edema, no cyanosis, no clubbing;  there is a slight ulcer on the dorsum of his right foot.  NEUROLOGIC:  Oriented to person, place, and time; cranial nerves II  through XII grossly intact; motor grossly intact.   EKG sinus rhythm, rate 63, old inferior infarct, no acute ST-T wave  changes, high lateral T-wave flattening, and unchanged previous EKGs.   ASSESSMENT AND PLAN:  1. Coronary artery disease.  The patient has had no new symptoms.  No      further cardiovascular testing is suggested.  He will continue with      aggressive risk reduction.  2. Pulmonary embolism/deep vein thrombosis.  He had a large thrombus      burden.  In fact, he had an IVC placed in addition to his      anticoagulation.  This was removed.  However, because of the clot      burden, I would continue to  give him Coumadin for now, but then may      discontinue this in a year and then complete a hypercoagulable      workup.  I will see him at that time to help with that decision.  3. Hypertension.  Blood pressure is controlled.  He will continue the      medications as listed.  4. Diabetes.  I told him I am very concerned about the foot ulcer.  He      is having it treated.  However, he does not take care of himself      with diet or exercise.  We had a long discussion about this.  I      told him the risk of loss of his limb in the future if he does not      begin to change his lifestyles.  Hopefully, he can comply with this      and follow closely with Dr. Caryl Comes in [facility] for better sugar control.  5. Dyslipidemia per Dr. Caryl Comes.  I would say the goal should be an LDL      less than 70 and HDL greater than 50.  I would be very aggressive      with this and suggest combination therapy if he is not close to      that.  Again, I will defer to Dr. Ramonita Lab.  6. Obesity.  He understands the need to lose weight with diet and      exercise.  We discussed this in length.  7. Followup.  I would like to see him in the spring to reconsider the      Coumadin issue.  I will refer him back to Dr. Ramonita Lab.     Minus Breeding, MD, [facility]  Electronically Signed    JH/MedQ  DD: 12/16/2007  DT: 12/17/2007  Job #: [id number]   cc:   Tama High, MD

## 2010-06-28 NOTE — Discharge Summary (Signed)
NAME:  Bobby Lindsey, Bobby Lindsey               ACCOUNT NO.:  [id number]   MEDICAL RECORD NO.:  [id number]          PATIENT TYPE:  INP   LOCATION:  [id number]                         FACILITY:  [facility]   PHYSICIAN:  Minus Breeding, MD, FACCDATE OF BIRTH:  September 21, 1955   DATE OF ADMISSION:  05/30/2007  DATE OF DISCHARGE:  06/03/2007                               DISCHARGE SUMMARY   PRIMARY CARDIOLOGIST:  Minus Breeding, MD, [facility].   PRIMARY CARE PHYSICIAN:  Dr. Jori Moll.   ENDOCRINOLOGIST:  Adah Perl, MD   PROCEDURES PERFORMED DURING HOSPITALIZATION:  1. Lower extremity Doppler study.  2. CT of the chest.  3. Insertion of inferior vena cava filter, a 62 Express retrievable on      June 02, 2007.   DISCHARGE DIAGNOSES:  1. Deep vein thrombosis with pulmonary embolus.  2. Known coronary artery disease.      a.     Status post coronary artery bypass grafting at [facility] with multivessel coronary artery disease.  3. Diabetes.  4. Hyper lipidemia  5. Peripheral neuropathy.   HOSPITAL COURSE:  This is a 55 year old male patient with a known  history of coronary artery disease with 4-vessel coronary artery bypass  grafting at [facility] who was seen by Dr. Percival Spanish in his office at the  [facility] for recurrent chest pain and shortness of breath.  The  patient also had some complaints of swelling and redness in his right  lower leg.  He has a history of severe peripheral neuropathy and does  not have any pain in that area.  They did a D-dimer in the office and  was found to be positive.  Lower extremity Doppler was completed and was  found positive for DVT.  The patient subsequently had a CT scan  revealing massive bilateral pulmonary emboli.  The patient was placed on  IV heparin and p.o. Coumadin and monitored closely for the next few  days.  The patient also underwent an echocardiogram, and it revealed  some right heart strain.  The patient recovered well during  hospitalization, and he had no complaints of further chest pain or  shortness of breath.  He was up walking around in the hallway prior to  going home and also educated on Lovenox self-injection at home so that  the patient may be discharged on a few days of Lovenox prior to follow  up in the Coumadin Clinic for bridging.  The patient was monitored by  pharmacy concerning PT/INR with use of Coumadin; it appeared that he had  some resistance.  The patient's Coumadin was increased from 10 mg daily  to 15 mg daily during hospitalization.  He did have some mild bleeding  at a right neck dressing, and the patient did recover from that with  pressure dressing and no further bleeding was found.   On day of discharge, the patient was seen and examined by Dr. Minus Breeding and found to be stable.  Blood pressure was 111/67, heart rate  65, respirations 20, temperature 97.7, and O2 saturation 94% on room  air.  The patient still had some mild foot swelling in the right leg but  not excessive and actually had improved since admission.  The patient  will follow up with Dr. Percival Spanish in 10 days.  He will follow up in the  Coumadin Clinic in 2 days for a PT/INR check.  He will go home on  Lovenox 120 mg subcutaneously twice a day bridging until followed in the  Coumadin Clinic in 2 days.  The patient has been advised that he will be  seen pretty regularly after discharge for continued management.  He has  been given prescriptions for Lovenox, Coumadin, and hydrochlorothiazide.   DISCHARGE LABS:  Hemoglobin 15.6, hematocrit 43.9, white blood cells  9.7, and platelets 177,000.  PT 18.9 and INR 1.5.  Echocardiogram  revealing normal LVEF, normal aortic valve, mild mitral valve  calcification, RV not well seen.   DISCHARGE MEDICATIONS:  1. Lovenox 120 mg twice a day subcutaneously for 2 days only.  2. Coumadin 15 mg daily until seen by Coumadin Clinic on Wednesday,      June 05, 2007.  3.  Hydrochlorothiazide 25 mg daily.  4. Lantus insulin 40 units twice a day.  5. Cozaar 50 mg daily.  6. Multivitamin daily.  7. Protonix 40 mg daily.  8. Metoprolol 25 mg daily.  9. Crestor 10 mg as taken at home daily.   ALLERGIES:  No known drug allergies.   FOLLOWUP PLANS AND APPOINTMENT:  1. The patient will be seen in the Coumadin Clinic on June 05, 2007,      at 9:30 a.m. for PT/INR and adjustment of Coumadin dosing.  2. The patient will follow with Dr. Percival Spanish on June 12, 2007, at 2      p.m. in the [facility] office for re-evaluation of symptoms and      the patient's status with probable follow up in the [place]      office thereafter.  3. The patient has been given Coumadin prescription, Lovenox      prescription, and hydrochlorothiazide prescription and has been      given Lovenox education for home use.  4. The patient has been advised to stop taking Plavix.  5. The patient has been advised for any bleeding issues that are      unable to be stopped.  He is to call or come to the emergency room      immediately.   TIME SPENT WITH THE PATIENT TO INCLUDE PHYSICIAN TIME:  40 minutes.      Phill Myron. Purcell Nails, NP      Minus Breeding, MD, [facility]  Electronically Signed    KML/MEDQ  D:  06/03/2007  T:  06/03/2007  Job:  [id number]   cc:   Dr. Jori Moll

## 2010-06-28 NOTE — Assessment & Plan Note (Signed)
[facility] OFFICE NOTE   NAME:Bobby Lindsey                        MRN:          [id number]  DATE:07/29/2007                            DOB:          10-11-1955    PRIMARY CARE PHYSICIAN:  Dr. Ramonita Lab   REASON FOR PRESENTATION:  Evaluate the patient with pulmonary embolism  and DVT.   HISTORY OF PRESENT ILLNESS:  The patient is 55 years old.  He presents  for followup of the above.  Since I last saw him, he has had less lower  extremity swelling, though he still gets more in the right than the left  leg.  This is more than he has had at baseline, but it does seem to go  away at night.  He has less shortness of breath as well, though he is  getting dyspnea with moderate exertion.  He has not been active now for  many weeks to the level that he was a year or so ago.  He is doing his  activities of daily living and working.  He denies any chest pressure,  neck or arm discomfort.  He denies any palpitations, presyncope, or  syncope.  He has had none of the severe acute shortness of breath or  discomfort that he had when I first met him.   PAST MEDICAL HISTORY:  1. Coronary artery disease, status post CABG.  (I do not have the full      details of this.  He had an occluded saphenous vein graft to an OM      and a PDA.  He had Cypher stenting to the right coronary artery.      This was apparently 4-vessel bypass in 2002).  2. Hypertension x15 years.  3. Dyslipidemia.  4. Diabetes mellitus.  5. Peripheral neuropathy.  6. Charcot joint.  7. Right lower extremity deep venous thrombosis.  8. Pulmonary embolism.  9. Left knee surgery.   ALLERGIES/INTOLERANCES:  BEE STINGS.   MEDICATIONS:  1. Aspirin 81 mg daily.  2. Cozaar 75 mg daily.  3. Fish oil b.i.d.  4. Humalog 15 units t.i.d.  5. Hydrochlorothiazide 25 mg daily.  6. Lantus 40 units b.i.d.  7. Crestor 10 mg daily.  8. Metoprolol 25 mg daily.  9.  Coumadin.   REVIEW OF SYSTEMS:  Positive for snoring and fatigue.   PHYSICAL EXAMINATION:  GENERAL:  The patient is in no distress.  VITAL SIGNS:  Blood pressure 130/82, heart rate 64 and regular, and  weight 265 pounds.  HEENT:  Eyes unremarkable; pupils are equal, round, and reactive to  light; fundi not visualized; oral mucosa unremarkable.  NECK:  No jugular distention at 45 degrees; carotid upstrokes brisk and  symmetric; no bruits, no thyromegaly.  LYMPHATICS:  No cervical, axillary, or inguinal adenopathy.  LUNGS:  Clear to auscultation bilaterally.  BACK:  No costovertebral angle tenderness.  CHEST:  Unremarkable.  HEART:  PMI not displaced or sustained; S1 and S2 within normal limits;  no S3, no S4; no clicks, no rubs, and no murmurs.  ABDOMEN:  Flat, positive bowel sounds, normal in frequency and pitch, no  bruits, no rebound, no guarding, no midline pulsatile mass, no  hepatomegaly, and no splenomegaly.  SKIN:  No rashes, no nodules.  EXTREMITIES:  Pulses 2+ throughout.  Right greater than left bilateral  lower extremity edema, mild.  Chronic venous stasis changes.  NEUROLOGIC:  Oriented to person, place, and time, cranial nerves II-XII  are grossly intact, motor grossly intact.   ASSESSMENT/PLAN:  1. Deep venous thrombosis.  The patient has now had his removable      filter in for almost 2 months.  I had discussed this with      Interventional Radiology at the last visit.  They stated they would      remove it at any point as long as there is no clot burden.  I would      like to try to get it out now, as he has had some improvement in      his lower extremity swelling.  I suspect strongly that he has some      residual fibrosed clot.  However, this would be a very low risk for      embolization and probably lower risks in complications with the      filter if it can be removed.  Of course, if they see a significant      clot burden in the filter, it cannot be removed.   He will remain on      the Coumadin indefinitely.  2. Pulmonary embolism, as above.  3. Hypertension.  Blood pressure is controlled and he will continue      medications as listed.  4. Coronary artery disease.  No further cardiovascular testing is      suggested.  He will continue with risk reduction.  5. Obesity.  We had a long discussion about this.  I have suggested      Weight Watchers.  6. Dyslipidemia per his primary care doctor with a goal LDL of less      than 70 and HDL greater than 40.  7. Fatigue and snoring.  It has been suggested to the patient that he      have a sleep study by both his endocrinologist and his primary care      doctor.  He now consents to this and I will arrange this to be done      at [facility].  8. Followup.  We will see him back in no less than 2 months or sooner      if needed.    Minus Breeding, MD, [facility]  Electronically Signed   JH/MedQ  DD: 07/29/2007  DT: 07/30/2007  Job #: [id number]   cc:   Eustace Moore Dr. Caryl Comes

## 2010-06-28 NOTE — Assessment & Plan Note (Signed)
[facility] OFFICE NOTE   NAME:Bobby Lindsey, Bobby Lindsey                        MRN:          [id number]  DATE:05/30/2007                            DOB:          11-07-1955    PRIMARY:  Dr. Ramonita Lab.   ENDOCRINOLOGIST:  Dr. Louretta Shorten.   REFERRING:  Self.   REASON FOR PRESENTATION:  Evaluate the patient with acute onset dyspnea  with exertion, chest pain, and known coronary disease.   HISTORY OF PRESENT ILLNESS:  The patient is a 55 year old gentleman with  a past history of coronary disease diagnosed in 2002.  He had CABG x4 at  [facility].  I do not have the details of this surgery.  He has been cared for  by [facility] but is switching his care.  He did not have chest  pain back in 2002 but rather had screening stress perfusion imaging and  was found to have multivessel coronary disease.  In 2005, he had a  catheterization and was noted to have occluded saphenous vein graft to  the OM1 and to the PDA.  There was CYPHER stenting to the RCA.  At that  time, he had complained of dyspnea.  He had recurrent symptoms  apparently in October 2006 and received distal RCA stenting.   He had gotten along relatively well.  However, 2 weeks ago he had an  episode of chest discomfort.  He said this was not like reflux that he  has had before.  He thought it was coming from his heart.  He took  nitroglycerin and it did resolve.  It happened at rest.  It was sharp  and substernal.  Did not radiate to his neck or into his arms.  He does  not recall associated nausea, vomiting or diaphoresis.  He has not had a  recurrence of those symptoms.  However, since that time he has had  dyspnea with minimal activity.  He says he gets short of breath walking  through his house.  This is distinctly unusual for him.  It is similar  to previous complaints he had at the time of catheterization, though  even potentially more severe.  He did  not have any resting shortness of  breath.  He is not describing any PND or orthopnea.  He has had no  dramatic weight gain.  Just in the last couple of days, he has had some  swelling and redness in his right lower leg.  He has severe peripheral  neuropathy and so does not have any pain down there or feel any  discomfort.  He has not taken any long trips.  He had not been  particularly immobile.  He had no trauma to his lower leg.   PAST MEDICAL HISTORY:  Coronary artery disease as described above,  hypertension times 15 years, hyperlipidemia, diabetes mellitus times  years, peripheral neuropathy, Charcot joint.   PAST SURGICAL HISTORY:  Left knee surgery, CABG times 4 in 2002.   ALLERGY:  BEE STINGS.   MEDICATIONS:  Aspirin 81 mg daily, Cozaar 50  mg, fish oil, Humalog 15  units t.i.d., hydrochlorothiazide 25 mg daily (recently been out of  this), Lantus 40 units b.i.d., multivitamin, Nexium 40 mg daily,  nitroglycerin 0.4 mg p.r.n., Plavix 75 mg daily, Crestor, metoprolol 25  mg daily, Lasix (has been taking this for the last couple of days).   SOCIAL HISTORY:  The patient is Clinical biochemist.  He is married.  Two  children.  He has never smoked cigarettes.  Does not drink alcohol.   FAMILY HISTORY:  Noncontributory for early coronary artery disease.  His  mother has heart problems at 19.   REVIEW OF SYSTEMS:  As stated in the HPI.  Positive for snoring (he has  refused sleep study), recent cough nonproductive, reflux, slow weight  gain over the last year.  Negative for all other systems.   PHYSICAL EXAMINATION:  The patient is in no distress.  Blood pressure 148/64, heart 68 regular, weight 266 pounds, body mass  index 36.  HEENT:  Eyelids unremarkable.  Pupils are equal, round, and reactive to  light and accommodation.  Fundi are not visualized.  Oral mucosa  unremarkable.  NECK:  8 cm jugular venous distension at 45 degrees with normal wave  form, carotid upstroke brisk  and symmetric, no bruits, thyromegaly.  LYMPHATICS:  No cervical, axillary, or inguinal adenopathy.  LUNGS:  Clear to auscultation bilaterally.  BACK:  No costovertebral angle tenderness.  CHEST:  Unremarkable, except for well-healed sternotomy scar.  HEART:  PMI not displaced or sustained, S1 and S2 within normal limits,  no S3, no S4, no clicks, rubs, murmurs.  ABDOMEN:  Obese, positive bowel sounds, normal in frequency and pitch,  no bruits, rebound, guarding.  No hepatomegaly, splenomegaly.  SKIN:  No rashes, no nodules.  EXTREMITIES:  With 2+ pulses throughout, right leg is swollen compared  to the left, there is mild erythema, although there is no cord, or calf  tenderness.  NEURO:  Oriented to person, place, and time, cranial nerves 2-12 grossly  intact, motor grossly intact.    EKG sinus rhythm, rate 68, axis within normal limits, intervals within  normal limits, old inferior infarct, lateral T-wave inversions without  old EKGs for comparison.  Could not exclude ischemia.   ASSESSMENT/PLAN:  1. Chest pain and dyspnea.  These symptoms are concerning to me.  The      first thing I am going to do is check a D-dimer and lower extremity      venous Doppler.  If his D-dimer is normal and his venous Doppler is      negative, then I will not assume that he has had pulmonary emboli.      He really does not have significant risk for this.  If excluded the      above, the next step should be a cardiac catheterization.  I think      the pretest probability that he is having symptoms related to high-      grade obstructive coronary disease is high.  He did have a stress      perfusion study in October which demonstrated no ischemia or      infarct.  I do not think the sensitivity and specificity of this      test is adequate, given his clinical situation.  Therefore, I would      proceed directly with cardiac catheterization.  He and I discussed      this at length.  He understands the  procedure very  well.  He      understands all the risks involved and chooses to accept these.  I      would do a right heart catheterization as well.  Further evaluation      based on these results.  2. Diabetes.  Will continue his medications holding his morning drugs      if he does have the catheterization tomorrow.  3. Dyslipidemia.  Has not been on any statin though he was encouraged      to take Crestor recently.  I will encourage him to begin this drug      based on secondary prevention trials.  4. Obesity understands the lose weight with diet, exercise.  5. Hypertension.  Blood pressure is slightly elevated today.  He may      need further med adjustments, in particular with higher dose of      Cozaar.     Minus Breeding, MD, [facility]  Electronically Signed    JH/MedQ  DD: 05/30/2007  DT: 05/30/2007  Job #: [id number]   cc:   Dr. Ramonita Lab

## 2010-06-28 NOTE — Assessment & Plan Note (Signed)
[facility] OFFICE NOTE   NAME:Bobby Lindsey, Bobby Lindsey                        MRN:          [id number]  DATE:06/12/2007                            DOB:          14-Feb-1955    PRIMARY CARE PHYSICIAN:  Dr. Ramonita Lab   REASON FOR PRESENTATION:  Evaluate patient with recent hospitalization  for pulmonary embolism.   HISTORY OF PRESENT ILLNESS:  The patient is a 55 year old gentleman with  coronary disease as described below.  He was hospitalized on April 16  when he presented the office with dyspnea.  He had lower extremity  swelling and was found to have extensive right leg DVT.  He was  admitted, started on anticoagulation and had a spiral CT demonstrating  bilateral pulmonary emboli.  He subsequently had an inferior vena cava  filter placed.  He was anticoagulated with Lovenox and Coumadin and  discharged on that combination.  He is now only on the Coumadin, which  is therapeutic.   Prior to that presentation he had complained of dyspnea walking across  the room.  Says his breathing is better, though he is not doing as much  as he was.  I have restricted his activities for the time being.  He is  not having any resting shortness of breath.  Denies any PND or  orthopnea.  Does not have any chest discomfort, neck or arm discomfort.  He has had no palpitations, pre-syncope or syncope.  He continues to  have some lower extremity swelling on the right greater than left.  Though this is somewhat improved.  Not having the leg pain that he was  having, though he has significant neuropathy.   PAST MEDICAL HISTORY:  1. Coronary artery disease (status post coronary artery bypass graft).      I do not have full details of this.  He has had occluded saphenous      vein graft to OM1 and PDA.  He has Cypher stenting to the right      coronary artery.  He apparently had a four-vessel coronary artery      bypass graft in 2002.  2.  Hypertension x 15 years.  3. Hyperlipidemia.  4. Diabetes mellitus.  5. Peripheral neuropathy.  6. Charcot joint.  7. Right lower extremity deep vein thrombosis.  8. Pulmonary embolism.  9. Left knee surgery.   ALLERGIES:  BEE STINGS.   MEDICATIONS:  1. Aspirin 81 mg daily.  2. Cozaar 25 mg daily.  3. Fish oil.  4. Humalog.  5. Hydrochlorothiazide.  6. Lantus.  7. Multivitamin.  8. Crestor.  9. Metoprolol 25 mg daily.   REVIEW OF SYSTEMS:  As stated in the HPI and otherwise negative for  other systems.   PHYSICAL EXAMINATION:  The patient is in no distress.  The blood  pressure 110/63, heart rate 70 regular, weight 264 pounds.  HEENT: Eyelids unremarkable. Pupils equal, round, and reactive to light.  Fundi not visualized. Oral mucosa is unremarkable.  NECK: No jugular venous distention at 45 degrees.  Carotid upstroke  brisk and symmetrical.  No bruits, no thyromegaly.  LYMPHATICS: No adenopathy.  LUNGS: Clear to auscultation bilaterally.  BACK: No costovertebral angle tenderness.  CHEST: Unremarkable.  HEART: PMI not displaced or sustained. S1, S2 within normal limits. No  S3. No S4. No clicks, rub or murmurs.  ABDOMEN: Morbidly obese, positive bowel sounds, normal in frequency and  pitch. No bruits. No rebounds. No guarding. No midline pulsatile mass.  No organomegaly.  SKIN: No rashes, no nodules.  EXTREMITIES: 2+ upper pulses, 1+ dorsalis pedis and posterior tibialis  bilaterally. The right leg is still swollen, but not erythematous. There  is some mild left leg edema as well.  NEURO: Oriented to person, place and time. Cranial nerves II-XII grossly  intact. Motor grossly intact.   EKG sinus rhythm, rate 66, old inferior infarct, no acute ST-T wave  changes.   ASSESSMENT/PLAN:  1. Pulmonary embolism.  The patient does have pulmonary emboli and      deep vein thrombosis.  He is on therapeutic Coumadin now.  He has a      removable [place] filter.  My plan is  to keep that in for about      30 days.  I understand have  60 days which to take this out.  I do      not ultimately plan to have it extracted by interventional      radiology.  2. Coronary disease.  He is having no new symptoms.  He had a stress      perfusion study in the fall of last year.  At this point we will      manage this with a secondary risk reduction.  3. Morbid obesity.  We had a long discussion about the need to lose      weight with diet and exercise.  4. Diabetes per Dr. Caryl Comes.  5. Risk reduction.  The patient is on Crestor.  The goal should be an      LDL less and 70 and HDL greater than 40.  6. Follow-up.  I will see the patient again in a little less than a      month in the [place] office and plan the removal of the [place]      filter or of the IVC filter.     Minus Breeding, MD, [facility]  Electronically Signed    JH/MedQ  DD: 06/12/2007  DT: 06/12/2007  Job #: [id number]   cc:   Ramonita Lab, MD

## 2010-06-28 NOTE — Assessment & Plan Note (Signed)
[facility] OFFICE NOTE   NAME:Bobby Lindsey, Bobby Lindsey                        MRN:          [id number]  DATE:06/25/2007                            DOB:          1955-06-13    PRIMARY CARE PHYSICIAN:  Dr. Kerrin Mo.   REASON FOR PRESENTATION:  Evaluate patient with pulmonary embolism and  DVT.   HISTORY OF PRESENT ILLNESS:  The patient returns for followup of the  above.  He remains on anticoagulation with Coumadin followed in our  Coumadin Clinic.  He is continuing to have right lower extremity  swelling.  This is worse at night after he has been on his feet.  He  does not have any of the tenderness or discomfort that he had when we  first diagnosed a DVT.  He does have some dyspnea, but this seems to be  improved.  It is not quite at baseline.  He is back to doing some  activities.  He is not having any resting shortness of breath.  Denies  any PND or orthopnea.  He has no chest discomfort, neck or arm  discomfort.  He has had no palpitations, presyncope or syncope.   PAST MEDICAL HISTORY:  1. Coronary artery disease (status post CABG.  I do not have the full      details of this.  He had an occluded saphenous vein graft to an OM1      and PDA.  He had Cypher stenting to the right coronary artery.      This was apparently a four-vessel bypass in 2002.)  2. Hypertension x 15 years.  3. Dyslipidemia.  4. Diabetes mellitus.  5. Peripheral neuropathy.  6. Charcot joint.  7. Right lower extremity deep venous thrombosis.  8. Pulmonary embolism.  9. Left knee surgery.   ALLERGIES:  BEE STINGS.   MEDICATIONS:  1. Aspirin 81 mg daily.  2. Cozaar 25 mg daily.  3. Fish oil.  4. Humalog 15 units t.i.d.  5. Hydrochlorothiazide 25 mg daily.  6. Lantus 40 mg b.i.d.  7. Multivitamin.  8. Crestor.  9. Metoprolol 25 mg daily.   REVIEW OF SYSTEMS:  As stated in the HPI and otherwise negative for  other systems.   PHYSICAL EXAMINATION:  GENERAL:  The patient is in no distress.  VITAL SIGNS:  Blood pressure 138/85, heart rate 65 and regular, weight  264 pounds.  HEENT:  Eyes are unremarkable.  Pupils are equal, round and  reactive to light.  Fundi not visualized, oral mucosa unremarkable.  NECK:  No jugular venous distention at 45 degrees, carotid upstroke  brisk and symmetrical.  No bruits, no thyromegaly.  LYMPHATICS:  No adenopathy.  LUNGS:  Clear to auscultation bilaterally.  BACK:  No costovertebral angle tenderness.  CHEST:  Unremarkable except for well-healed sternotomy scar.  HEART:  PMI not displaced or sustained, S1-S2 within normal limits.  No  S3, no S4, no clicks, rubs, murmurs.  ABDOMEN:  Obese, positive bowel sounds normal in frequency and pitch, no  bruits, rebound, guarding, no midline pulsatile mass, no  organomegaly.  SKIN:  No rashes, no nodules.  EXTREMITIES:  2+ pulses throughout, right leg is swollen with some  chronic  venostasis changes, although there is no cord or calf tenderness.  NEURO:  Oriented to person, place, time, cranial nerves II-XII are  grossly intact, motor grossly intact.   ASSESSMENT/PLAN:  1. Deep venous thrombosis/pulmonary embolism.  The patient will remain      on the Coumadin.  I am going to continue this indefinitely.  I      understand he has had venous thrombosis after his bypass surgery as      well.  Given this is the second event and a major event, I would      favor lifelong Coumadin.  I would like to remove the removable vena      cava filter if there is no clot in it.  I will try to schedule this      with our interventional radiologist who put it in.  2. Coronary disease.  If the patient continues to have dyspnea as he      gets out further from this event, I am going to probably evaluate      him with another stress perfusion study.  3. Obesity.  This is clearly contributing to the patient's lower      extremity swelling as well.  He  needs to lose weight.  I have also      talked with him about salt restriction as this is contributing to      his edema.  He does not and steadfastly refuses to wear any      compression stockings.  4. Hypertension.  Blood pressure is controlled on the medications as      listed.  He will continue with these.  5. Dyslipidemia per his primary care doctor with a goal LDL less than      70 and HDL greater than 40.   FOLLOW UP:  I will see him back in about 2 months, but will be looking  to remove the deep vena cava filter before then.    Minus Breeding, MD, [facility]  Electronically Signed   JH/MedQ  DD: 06/25/2007  DT: 06/25/2007  Job #: [id number]   cc:   Kerrin Mo, MD

## 2010-06-28 NOTE — Assessment & Plan Note (Signed)
[facility] OFFICE NOTE   NAME:Bobby Lindsey, Bobby Lindsey                        MRN:          [id number]  DATE:03/05/2008                            DOB:          1955/10/26    PRIMARY CARE PHYSICIAN:  Dr. Tama High.   REASON FOR PRESENTATION:  Evaluate the patient with coronary disease and  pulmonary embolism.   HISTORY OF PRESENT ILLNESS:  The patient returns for followup of the  above.  He is 55 years old.  He has been doing okay since I last saw  him, though he has had some chest discomfort.  He said this happens  sporadically.  He describes upper epigastric and substernal discomfort.  He says it is different from reflux and is similar to previous angina.  He took nitroglycerin actually on Monday and the pain did go away.  It  happens at rest.  There is no associated nausea, vomiting, or  diaphoresis.  It is moderate-to-mild in intensity.  He has had no  palpitation, presyncope, or syncope.  He has had no PND or orthopnea.  He has a lot of problems with his legs related to his neuropathy and  Charcot joints.  His diabetes I do not think it has been well  controlled.  He has had some mild chronic right lower extremity leg  swelling since he had a DVT and pulmonary embolism in the past.  He is  not exercising as much as I would like, though he is working.  He did  have a diagnosis of sleep apnea, but never went to have the CPAP fitted.  He does not want to do that at this point.   PAST MEDICAL HISTORY:  1. Coronary artery disease status post CABG (I do not have the full      details of this.  He had an occluded saphenous vein graft to OM,      PDA after CABG.  He had a Cypher stent to the right coronary      artery.  This is apparently a 4-vessel CABG in 2002).  2. Hypertension x15 years.  3. Dyslipidemia.  4. Diabetes mellitus.  5. Peripheral neuropathy.  6. Charcot joint.  7. Pulmonary embolism.  8. Right  lower extremity deep venous thrombosis.  9. Left knee surgery.   ALLERGIES AND INTOLERANCES:  BEESTINGS.   MEDICATIONS:  1. Cozaar 25 mg daily.  2. Fish oil b.i.d.  3. Humalog 15 units t.i.d.  4. Hydrochlorothiazide 25 mg daily.  5. Lantus 40 units b.i.d.  6. Metoprolol 25 mg daily.  7. Coumadin.  8. Aspirin 81 mg daily.  9. Crestor 10 mg daily.   REVIEW OF SYSTEMS:  As stated in the HPI and otherwise negative for  other systems.   PHYSICAL EXAMINATION:  GENERAL:  The patient is pleasant in no distress.  VITAL SIGNS:  Weight 265 pounds, body mass index 36, blood pressure  132/78, and heart rate 66 and regular.  HEENT:  Eyelids unremarkable, pupils equal, round, and reactive to  light, fundi not visualized, oral mucosa unremarkable.  NECK:  No jugular venous distention at 45 degrees, carotid upstroke  brisk and symmetrical, no bruits, no thyromegaly.  LYMPHATICS:  No cervical, axillary, or inguinal adenopathy.  LUNGS:  Clear to auscultation bilaterally.  BACK:  No costovertebral angle tenderness.  CHEST:  Well healed sternotomy scar.  HEART:  PMI not displaced or sustained, S1 and S2 within normal limits,  no S3, no S4, no clicks, no rubs, and no murmurs.  ABDOMEN:  Morbidly obese, positive bowel sounds, normal in frequency and  pitch, no bruits, no rebound, no guarding, no midline pulsatile mass, no  hepatomegaly, and no splenomegaly.  SKIN:  No rashes, no nodules.  EXTREMITIES:  Pulses 2+ throughout, right greater than left, bilateral  lower extremity edema mild, chronic venostasis changes, changes from  Charcot joint, he has a large callus on the dorsum of his right foot.  NEURO:  Oriented to person, place, and time.  Cranial nerves II through  XII are grossly intact.  Motor grossly intact.   EKG sinus rhythm, rate 65, axis within normal limits, intervals within  normal limits, no acute ST-T wave changes.   ASSESSMENT AND PLAN:  1. Coronary disease.  The patient has  coronary disease as described      and ongoing chest discomfort.  He has significant cardiovascular      risk factors.  I have not screened him with a stress perfusion      study since I have known him.  Therefore, this is indicated.  He      says he could not walk on a treadmill and he was unable to do this      in the past.  Therefore, he will have an adenosine Cardiolite.  2. Dyslipidemia.  He is getting lipid profile when I see him back for      the stress test.  Make sure that he has a target LDL less than 70      and HDL greater than 40.  3. Sleep apnea.  He does not want to wear the CPAP.  We talked about      this.  He is going to try weight loss and I encouraged this, though      I am doubtful.  4. Diabetes.  He is seeing Dr. Addison Lank today.  He understands he needs      to participate better for control.  5. Pulmonary emboli.  Probably, we will discontinue his Coumadin in      April and do a      hypercoagulable workup.  6. Followup.  I will see him in April to make the decision about      Coumadin and followup.     Minus Breeding, MD, [facility]  Electronically Signed    JH/MedQ  DD: 03/05/2008  DT: 03/06/2008  Job #: [id number]   cc:   Dr. Tama High.

## 2010-06-29 ENCOUNTER — Other Ambulatory Visit: Payer: Self-pay | Admitting: Cardiology

## 2010-06-29 DIAGNOSIS — R0602 Shortness of breath: Secondary | ICD-10-CM

## 2010-06-30 ENCOUNTER — Other Ambulatory Visit (INDEPENDENT_AMBULATORY_CARE_PROVIDER_SITE_OTHER): Payer: PRIVATE HEALTH INSURANCE | Admitting: *Deleted

## 2010-06-30 ENCOUNTER — Other Ambulatory Visit: Payer: Self-pay | Admitting: *Deleted

## 2010-06-30 DIAGNOSIS — I2510 Atherosclerotic heart disease of native coronary artery without angina pectoris: Secondary | ICD-10-CM

## 2010-06-30 DIAGNOSIS — R0609 Other forms of dyspnea: Secondary | ICD-10-CM

## 2010-06-30 DIAGNOSIS — R0989 Other specified symptoms and signs involving the circulatory and respiratory systems: Secondary | ICD-10-CM

## 2010-06-30 DIAGNOSIS — R0602 Shortness of breath: Secondary | ICD-10-CM

## 2010-07-01 LAB — BRAIN NATRIURETIC PEPTIDE: Brain Natriuretic Peptide: 47.6 pg/mL (ref 0.0–100.0)

## 2010-07-06 NOTE — Progress Notes (Signed)
Left message of normal results and to call back if questions

## 2010-07-12 ENCOUNTER — Encounter: Payer: Self-pay | Admitting: Cardiovascular Disease

## 2010-07-13 NOTE — Progress Notes (Signed)
Left message for pt of normal results on private voicemail

## 2010-07-14 ENCOUNTER — Encounter: Payer: Self-pay | Admitting: Cardiology

## 2010-07-28 ENCOUNTER — Encounter: Payer: Self-pay | Admitting: Cardiology

## 2010-07-28 ENCOUNTER — Ambulatory Visit (INDEPENDENT_AMBULATORY_CARE_PROVIDER_SITE_OTHER): Payer: PRIVATE HEALTH INSURANCE | Admitting: Cardiology

## 2010-07-28 VITALS — BP 140/88 | HR 53 | Resp 18 | Ht 72.0 in | Wt 269.0 lb

## 2010-07-28 DIAGNOSIS — I10 Essential (primary) hypertension: Secondary | ICD-10-CM

## 2010-07-28 DIAGNOSIS — M791 Myalgia, unspecified site: Secondary | ICD-10-CM

## 2010-07-28 DIAGNOSIS — I2510 Atherosclerotic heart disease of native coronary artery without angina pectoris: Secondary | ICD-10-CM

## 2010-07-28 DIAGNOSIS — E669 Obesity, unspecified: Secondary | ICD-10-CM

## 2010-07-28 DIAGNOSIS — N529 Male erectile dysfunction, unspecified: Secondary | ICD-10-CM

## 2010-07-28 DIAGNOSIS — E785 Hyperlipidemia, unspecified: Secondary | ICD-10-CM

## 2010-07-28 DIAGNOSIS — R0602 Shortness of breath: Secondary | ICD-10-CM

## 2010-07-28 DIAGNOSIS — IMO0001 Reserved for inherently not codable concepts without codable children: Secondary | ICD-10-CM

## 2010-07-28 LAB — CK: Total CK: 1457 U/L — ABNORMAL HIGH (ref 7–232)

## 2010-07-28 MED ORDER — LOSARTAN POTASSIUM 50 MG PO TABS: 50.0000 mg | ORAL_TABLET | Freq: Two times a day (BID) | ORAL | Status: DC

## 2010-07-28 NOTE — Progress Notes (Signed)
HPI The patient presents for followup of dyspnea. Since I last saw him a BNP level was below 50. Stress perfusion testing demonstrated a very mild inferior defect with an EF of 47%. However, echo demonstrated yet to be 60%. He continues to have the same complaints of dyspnea walking up stairs. Is not describing PND or orthopnea. He is not describing chest pressure, neck or arm discomfort. This had no new weight gain or edema. He continued playing and diffuse muscle aches and in fact he stopped the hydrochlorothiazide he was taking. He thinks this helped with his muscles.  He still works full time in [employer] he doesn't exercise. Not describing chest pressure or arm discomfort. He had no palpitations, presyncope or syncope.   No Known Allergies  Current Outpatient Prescriptions  Medication Sig Dispense Refill  . aspirin 81 MG tablet Take 81 mg by mouth daily.        Marland Kitchen esomeprazole (NEXIUM) 40 MG capsule Take 40 mg by mouth as needed.        . fish oil-omega-3 fatty acids 1000 MG capsule Take 2 g by mouth daily.        . insulin glargine (LANTUS SOLOSTAR) 100 UNIT/ML injection Inject into the skin at bedtime. UAD        . insulin lispro (HUMALOG KWIKPEN) 100 UNIT/ML injection Inject into the skin 3 (three) times daily before meals. UAD        . losartan (COZAAR) 50 MG tablet Take 50 mg by mouth daily.        . metoprolol succinate (TOPROL-XL) 25 MG 24 hr tablet Take 25 mg by mouth daily.        . nitroGLYCERIN (NITROSTAT) 0.4 MG SL tablet Place 0.4 mg under the tongue every 5 (five) minutes as needed.        Marland Kitchen DISCONTD: benzonatate (TESSALON) 200 MG capsule Take 200 mg by mouth as needed.        Marland Kitchen DISCONTD: dextromethorphan-guaiFENesin (MUCINEX DM) 30-600 MG per 12 hr tablet Take 1 tablet by mouth every 12 (twelve) hours. UAD        . DISCONTD: hydrochlorothiazide 25 MG tablet Take 1 tablet (25 mg total) by mouth daily.  30 tablet  6    Past Medical History  Diagnosis Date  . CAD (coronary  artery disease)     status post CABG (I do not have the full details of this. He had an occluded saphenous vein graft to OM, PDA after CABG. he had a cypher stent to the right coronary artery. This is apparently a 4-vesel CABG in 2002.    Marland Kitchen HTN (hypertension)     x 15 years  . Dyslipidemia   . Diabetes mellitus   . Peripheral neuropathy   . Charcot's joint of knee     UNSPECIFIED AREA  . Pulmonary embolism   . Deep venous thrombosis     right lower extremity    Past Surgical History  Procedure Date  . Left knee surgery   . Coronary artery bypass graft     4 time since 2002    ROS:  Erectile dysfunction  PHYSICAL EXAM BP 140/88  Pulse 53  Resp 18  Ht 6' (1.829 m)  Wt 269 lb (122.018 kg)  BMI 36.48 kg/m2 GENERAL:  Well appearing HEENT:  Pupils equal round and reactive, fundi not visualized, oral mucosa unremarkable NECK:  No jugular venous distention, waveform within normal limits, carotid upstroke brisk and symmetric, no bruits, no thyromegaly LYMPHATICS:  No cervical, inguinal adenopathy LUNGS:  Clear to auscultation bilaterally BACK:  No CVA tenderness CHEST:  Well healed sternotomy scar. HEART:  PMI not displaced or sustained,S1 and S2 within normal limits, no S3, no S4, no clicks, no rubs, no murmurs ABD:  Flat, positive bowel sounds normal in frequency in pitch, no bruits, no rebound, no guarding, no midline pulsatile mass, no hepatomegaly, no splenomegaly, obese EXT:  2 plus pulses throughout, no edema, no cyanosis no clubbing, charcot joints SKIN:  No rashes no nodules, lipomas NEURO:  Cranial nerves II through XII grossly intact, motor grossly intact throughout PSYCH:  Cognitively intact, oriented to person place and time   EKG:  Sinus bradycardia, rate 53, axis within normal limits, old inferior infarct, lateral T-wave inversion without change from previous  ASSESSMENT AND PLAN

## 2010-07-28 NOTE — Assessment & Plan Note (Addendum)
No further cardiovascular testing suggested. Needs aggressive risk reduction.

## 2010-07-28 NOTE — Assessment & Plan Note (Signed)
His blood pressure is not at target did not tolerate the HCTZ. I will increase his Cozaar to 50 mg b.i.d.

## 2010-07-28 NOTE — Assessment & Plan Note (Signed)
I doubt that this is related to metoprolol.  I will refer him for urology evaluation.

## 2010-07-28 NOTE — Assessment & Plan Note (Signed)
I would like for him to be on a statin. However, in January his CK was slightly elevated. I will repeat a CK. If normal I think I can convince to try pravastatin which I would start at 40 mg daily.

## 2010-07-28 NOTE — Patient Instructions (Addendum)
Increase Cozaar to 50 mg twice a day Continue all other medications as listed Please have blood work today (CK) If your blood work comes back normal you will be started on Pravastatin 40 mg a day You have been referred to Urology Follow up with Dr Percival Spanish in 6 months

## 2010-07-28 NOTE — Assessment & Plan Note (Signed)
I do not have a clear explanation for his dyspnea but there is no evidence that it is coming from cardiovascular source. It may be a primary lung problem with weight. No further cardiovascular testing is suggested. I will refer him back to his primary physician.

## 2010-07-28 NOTE — Assessment & Plan Note (Signed)
We have discussed this many times at length.  He has not been able to lose weight.

## 2010-08-01 ENCOUNTER — Encounter: Payer: Self-pay | Admitting: Cardiovascular Disease

## 2010-08-02 ENCOUNTER — Telehealth: Payer: Self-pay | Admitting: Cardiology

## 2010-08-02 NOTE — Telephone Encounter (Signed)
All Cardiac Faxed to [facility] @ [phone]  08/02/10/km

## 2010-08-03 ENCOUNTER — Telehealth: Payer: Self-pay | Admitting: *Deleted

## 2010-08-03 NOTE — Telephone Encounter (Signed)
Left message for pt to call back as he needs further blood work for elevated CK.  Pt needs the following labs TSH,T3,T4,ANA,ESR,RFand a hepatic panel for muscle ache 729.1 and elevated CK.

## 2010-08-05 ENCOUNTER — Telehealth: Payer: Self-pay | Admitting: Cardiology

## 2010-08-05 DIAGNOSIS — R748 Abnormal levels of other serum enzymes: Secondary | ICD-10-CM

## 2010-08-05 DIAGNOSIS — M791 Myalgia, unspecified site: Secondary | ICD-10-CM

## 2010-08-05 NOTE — Telephone Encounter (Signed)
Pt awre of need for further labs and will come in Monday afternoon for it to be drawn

## 2010-08-05 NOTE — Telephone Encounter (Signed)
Pt rtn your call from a couple of days ago

## 2010-08-05 NOTE — Telephone Encounter (Signed)
PT AWARE OF NEED FOR LABS AND WILL COME Monday AFTERNOON TO HAVE THEM DRAWN

## 2010-08-10 ENCOUNTER — Other Ambulatory Visit: Payer: Self-pay | Admitting: *Deleted

## 2010-08-10 MED ORDER — METOPROLOL SUCCINATE ER 25 MG PO TB24: 25.0000 mg | ORAL_TABLET | Freq: Every day | ORAL | Status: DC

## 2010-08-11 ENCOUNTER — Telehealth: Payer: Self-pay | Admitting: Cardiology

## 2010-08-11 ENCOUNTER — Other Ambulatory Visit (INDEPENDENT_AMBULATORY_CARE_PROVIDER_SITE_OTHER): Payer: PRIVATE HEALTH INSURANCE | Admitting: *Deleted

## 2010-08-11 DIAGNOSIS — IMO0001 Reserved for inherently not codable concepts without codable children: Secondary | ICD-10-CM

## 2010-08-11 DIAGNOSIS — M791 Myalgia, unspecified site: Secondary | ICD-10-CM

## 2010-08-11 DIAGNOSIS — R748 Abnormal levels of other serum enzymes: Secondary | ICD-10-CM

## 2010-08-11 LAB — HEPATIC FUNCTION PANEL
ALT: 33 U/L (ref 0–53)
AST: 26 U/L (ref 0–37)
Albumin: 3.7 g/dL (ref 3.5–5.2)
Alkaline Phosphatase: 52 U/L (ref 39–117)
Bilirubin, Direct: 0.1 mg/dL (ref 0.0–0.3)
Total Bilirubin: 0.8 mg/dL (ref 0.3–1.2)
Total Protein: 6.6 g/dL (ref 6.0–8.3)

## 2010-08-11 LAB — T4: T4, Total: 8 ug/dL (ref 5.0–12.5)

## 2010-08-11 LAB — SEDIMENTATION RATE: Sed Rate: 7 mm/hr (ref 0–22)

## 2010-08-11 LAB — T3: T3, Total: 136.2 ng/dL (ref 80.0–204.0)

## 2010-08-11 LAB — TSH: TSH: 1.76 u[IU]/mL (ref 0.35–5.50)

## 2010-08-11 MED ORDER — METOPROLOL SUCCINATE ER 25 MG PO TB24: 25.0000 mg | ORAL_TABLET | Freq: Every day | ORAL | Status: DC

## 2010-08-11 NOTE — Telephone Encounter (Signed)
Pt needs a refill on toprol to [facility] in [place]. [phone]

## 2010-08-12 LAB — ANA: Anti Nuclear Antibody(ANA): NEGATIVE

## 2010-08-12 LAB — RHEUMATOID FACTOR: Rhuematoid fact SerPl-aCnc: <10 IU/mL (ref <=14)

## 2010-08-17 ENCOUNTER — Emergency Department (HOSPITAL_COMMUNITY)
Admission: EM | Admit: 2010-08-17 | Discharge: 2010-08-18 | Disposition: A | Discharge status: Home / Self Care | Payer: PRIVATE HEALTH INSURANCE | Attending: Emergency Medicine | Admitting: Emergency Medicine

## 2010-08-17 DIAGNOSIS — E785 Hyperlipidemia, unspecified: Secondary | ICD-10-CM | POA: Insufficient documentation

## 2010-08-17 DIAGNOSIS — K5730 Diverticulosis of large intestine without perforation or abscess without bleeding: Secondary | ICD-10-CM | POA: Insufficient documentation

## 2010-08-17 DIAGNOSIS — G9389 Other specified disorders of brain: Secondary | ICD-10-CM | POA: Insufficient documentation

## 2010-08-17 DIAGNOSIS — I10 Essential (primary) hypertension: Secondary | ICD-10-CM | POA: Insufficient documentation

## 2010-08-17 DIAGNOSIS — IMO0002 Reserved for concepts with insufficient information to code with codable children: Secondary | ICD-10-CM | POA: Insufficient documentation

## 2010-08-17 DIAGNOSIS — S301XXA Contusion of abdominal wall, initial encounter: Secondary | ICD-10-CM | POA: Insufficient documentation

## 2010-08-17 DIAGNOSIS — Z951 Presence of aortocoronary bypass graft: Secondary | ICD-10-CM | POA: Insufficient documentation

## 2010-08-17 DIAGNOSIS — Z86718 Personal history of other venous thrombosis and embolism: Secondary | ICD-10-CM | POA: Insufficient documentation

## 2010-08-17 DIAGNOSIS — R079 Chest pain, unspecified: Secondary | ICD-10-CM | POA: Insufficient documentation

## 2010-08-17 DIAGNOSIS — R0602 Shortness of breath: Secondary | ICD-10-CM | POA: Insufficient documentation

## 2010-08-17 DIAGNOSIS — I2510 Atherosclerotic heart disease of native coronary artery without angina pectoris: Secondary | ICD-10-CM | POA: Insufficient documentation

## 2010-08-17 DIAGNOSIS — K8020 Calculus of gallbladder without cholecystitis without obstruction: Secondary | ICD-10-CM | POA: Insufficient documentation

## 2010-08-17 DIAGNOSIS — Z794 Long term (current) use of insulin: Secondary | ICD-10-CM | POA: Insufficient documentation

## 2010-08-17 DIAGNOSIS — I517 Cardiomegaly: Secondary | ICD-10-CM | POA: Insufficient documentation

## 2010-08-17 DIAGNOSIS — S2249XA Multiple fractures of ribs, unspecified side, initial encounter for closed fracture: Secondary | ICD-10-CM | POA: Insufficient documentation

## 2010-08-17 DIAGNOSIS — E119 Type 2 diabetes mellitus without complications: Secondary | ICD-10-CM | POA: Insufficient documentation

## 2010-08-17 DIAGNOSIS — Z86711 Personal history of pulmonary embolism: Secondary | ICD-10-CM | POA: Insufficient documentation

## 2010-08-17 DIAGNOSIS — N200 Calculus of kidney: Secondary | ICD-10-CM | POA: Insufficient documentation

## 2010-08-17 LAB — CBC
HCT: 45.4 % (ref 39.0–52.0)
Hemoglobin: 16.4 g/dL (ref 13.0–17.0)
MCH: 30 pg (ref 26.0–34.0)
MCHC: 36.1 g/dL — ABNORMAL HIGH (ref 30.0–36.0)
MCV: 83 fL (ref 78.0–100.0)
Platelets: 170 10*3/uL (ref 150–400)
RBC: 5.47 MIL/uL (ref 4.22–5.81)
RDW: 13.1 % (ref 11.5–15.5)
WBC: 7.8 10*3/uL (ref 4.0–10.5)

## 2010-08-17 LAB — DIFFERENTIAL
Basophils Absolute: 0.1 10*3/uL (ref 0.0–0.1)
Basophils Relative: 1 % (ref 0–1)
Eosinophils Absolute: 0.1 10*3/uL (ref 0.0–0.7)
Eosinophils Relative: 2 % (ref 0–5)
Lymphocytes Relative: 29 % (ref 12–46)
Lymphs Abs: 2.3 10*3/uL (ref 0.7–4.0)
Monocytes Absolute: 0.8 10*3/uL (ref 0.1–1.0)
Monocytes Relative: 10 % (ref 3–12)
Neutro Abs: 4.5 10*3/uL (ref 1.7–7.7)
Neutrophils Relative %: 58 % (ref 43–77)

## 2010-08-18 ENCOUNTER — Emergency Department (HOSPITAL_COMMUNITY): Payer: PRIVATE HEALTH INSURANCE

## 2010-08-18 ENCOUNTER — Encounter (HOSPITAL_COMMUNITY): Payer: Self-pay | Admitting: Radiology

## 2010-08-18 LAB — BASIC METABOLIC PANEL
BUN: 18 mg/dL (ref 6–23)
CO2: 27 mEq/L (ref 19–32)
Calcium: 9.7 mg/dL (ref 8.4–10.5)
Chloride: 96 mEq/L (ref 96–112)
Creatinine, Ser: 1.23 mg/dL (ref 0.50–1.35)
GFR calc Af Amer: >60 mL/min (ref >60)
GFR calc non Af Amer: >60 mL/min (ref >60)
Glucose, Bld: 293 mg/dL — ABNORMAL HIGH (ref 70–99)
Potassium: 4.9 mEq/L (ref 3.5–5.1)
Sodium: 132 mEq/L — ABNORMAL LOW (ref 135–145)

## 2010-08-18 LAB — TROPONIN I: Troponin I: <0.3 ng/mL (ref <0.30)

## 2010-08-18 LAB — PROTIME-INR
INR: 0.93 (ref 0.00–1.49)
Prothrombin Time: 12.7 seconds (ref 11.6–15.2)

## 2010-08-18 LAB — URINE MICROSCOPIC-ADD ON

## 2010-08-18 LAB — URINALYSIS, ROUTINE W REFLEX MICROSCOPIC
Bilirubin Urine: NEGATIVE
Glucose, UA: >1000 mg/dL — AB
Hgb urine dipstick: NEGATIVE
Ketones, ur: 15 mg/dL — AB
Leukocytes, UA: NEGATIVE
Nitrite: NEGATIVE
Protein, ur: 100 mg/dL — AB
Specific Gravity, Urine: 1.036 — ABNORMAL HIGH (ref 1.005–1.030)
Urobilinogen, UA: 1 mg/dL (ref 0.0–1.0)
pH: 6.5 (ref 5.0–8.0)

## 2010-08-18 LAB — CK TOTAL AND CKMB (NOT AT ARMC)
CK, MB: 5.5 ng/mL — ABNORMAL HIGH (ref 0.3–4.0)
Relative Index: 1.5 (ref 0.0–2.5)
Total CK: 366 U/L — ABNORMAL HIGH (ref 7–232)

## 2010-08-18 MED ORDER — IOHEXOL 300 MG/ML  SOLN
100.0000 mL | Freq: Once | INTRAMUSCULAR | Status: AC | PRN
  Administered 2010-08-18: 100 mL via INTRAVENOUS

## 2010-08-19 LAB — URINE CULTURE
Colony Count: NO GROWTH
Culture  Setup Time: 201207050952
Culture: NO GROWTH

## 2010-09-02 ENCOUNTER — Telehealth: Payer: Self-pay | Admitting: Cardiology

## 2010-09-06 ENCOUNTER — Ambulatory Visit (INDEPENDENT_AMBULATORY_CARE_PROVIDER_SITE_OTHER): Payer: PRIVATE HEALTH INSURANCE | Admitting: Cardiology

## 2010-09-06 ENCOUNTER — Encounter: Payer: Self-pay | Admitting: Cardiology

## 2010-09-06 DIAGNOSIS — M6089 Other myositis, multiple sites: Secondary | ICD-10-CM | POA: Insufficient documentation

## 2010-09-06 DIAGNOSIS — I10 Essential (primary) hypertension: Secondary | ICD-10-CM

## 2010-09-06 DIAGNOSIS — R0602 Shortness of breath: Secondary | ICD-10-CM

## 2010-09-06 DIAGNOSIS — M79609 Pain in unspecified limb: Secondary | ICD-10-CM

## 2010-09-06 DIAGNOSIS — M791 Myalgia, unspecified site: Secondary | ICD-10-CM

## 2010-09-06 DIAGNOSIS — R609 Edema, unspecified: Secondary | ICD-10-CM

## 2010-09-06 DIAGNOSIS — E785 Hyperlipidemia, unspecified: Secondary | ICD-10-CM

## 2010-09-06 DIAGNOSIS — M6080 Other myositis, unspecified site: Secondary | ICD-10-CM | POA: Insufficient documentation

## 2010-09-06 DIAGNOSIS — M79606 Pain in leg, unspecified: Secondary | ICD-10-CM

## 2010-09-06 DIAGNOSIS — E669 Obesity, unspecified: Secondary | ICD-10-CM

## 2010-09-06 DIAGNOSIS — IMO0001 Reserved for inherently not codable concepts without codable children: Secondary | ICD-10-CM

## 2010-09-06 DIAGNOSIS — I2510 Atherosclerotic heart disease of native coronary artery without angina pectoris: Secondary | ICD-10-CM

## 2010-09-06 LAB — CK: Total CK: 148 U/L (ref 7–232)

## 2010-09-06 NOTE — Assessment & Plan Note (Signed)
I will have him increase his Cozaar to bid as I had previously prescribed.

## 2010-09-06 NOTE — Assessment & Plan Note (Signed)
Given his previous lower extremity DVT and PE swelling now I will repeat venous Dopplers.

## 2010-09-06 NOTE — Assessment & Plan Note (Signed)
We discussed this again today. He understands and needs to lose weight with diet and exercise.

## 2010-09-06 NOTE — Assessment & Plan Note (Signed)
I will repeat a CK. If this remains elevated he may need referral to a rheumatologist.
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
## 2010-09-06 NOTE — Patient Instructions (Signed)
Your physician has requested that you have a lower extremity venous duplex. This test is an ultrasound of the veins in the legs or arms. It looks at venous blood flow that carries blood from the heart to the legs or arms. Allow one hour for a Lower Venous exam. Allow thirty minutes for an Upper Venous exam. There are no restrictions or special instructions.  Please have lab work today (CK)  Please increase your Cozaar as instructed.  Continue all other medications as listed

## 2010-09-06 NOTE — Assessment & Plan Note (Signed)
The patient has no new sypmtoms.  No further cardiovascular testing is indicated.  We will continue with aggressive risk reduction and meds as listed.  

## 2010-09-07 ENCOUNTER — Encounter: Payer: PRIVATE HEALTH INSURANCE | Admitting: *Deleted

## 2010-09-08 ENCOUNTER — Other Ambulatory Visit: Payer: Self-pay | Admitting: *Deleted

## 2010-09-08 ENCOUNTER — Encounter (INDEPENDENT_AMBULATORY_CARE_PROVIDER_SITE_OTHER): Payer: PRIVATE HEALTH INSURANCE | Admitting: Cardiology

## 2010-09-08 DIAGNOSIS — I8000 Phlebitis and thrombophlebitis of superficial vessels of unspecified lower extremity: Secondary | ICD-10-CM

## 2010-09-08 DIAGNOSIS — M7989 Other specified soft tissue disorders: Secondary | ICD-10-CM

## 2010-09-08 DIAGNOSIS — I82409 Acute embolism and thrombosis of unspecified deep veins of unspecified lower extremity: Secondary | ICD-10-CM

## 2010-09-08 DIAGNOSIS — M79606 Pain in leg, unspecified: Secondary | ICD-10-CM

## 2010-09-08 NOTE — Telephone Encounter (Signed)
Per Dr Percival Spanish - due to pts history and current non-occlusive thrombus on doppler study 09/08/10 pt is to start coumadin 5 mg a day.  He will need to be on the coumadin approximately 3 months.  I attempted to call the pt on his cell number and get a recording stating the pt is unavailable right now.  There is no answer on his home phone.  Will continue to attempt to contact the pt.

## 2010-09-09 ENCOUNTER — Other Ambulatory Visit: Payer: Self-pay | Admitting: *Deleted

## 2010-09-09 MED ORDER — WARFARIN SODIUM 5 MG PO TABS: ORAL_TABLET | ORAL | Status: DC

## 2010-09-09 NOTE — Telephone Encounter (Signed)
Spoke with patient  He is going to start Coumadin 5mg  daily and come in on 09/14/10 at 11:45 to have his INR checked in the [facility] office  RX called in to [facility] in [place]

## 2010-09-12 ENCOUNTER — Encounter: Payer: Self-pay | Admitting: Cardiology

## 2010-09-12 NOTE — Telephone Encounter (Signed)
Pt aware.

## 2010-09-13 ENCOUNTER — Telehealth: Payer: Self-pay | Admitting: *Deleted

## 2010-09-13 NOTE — Telephone Encounter (Signed)
Pt needs to know what dose of coumadin he needs to take.  Directions were not on the bottle when he picked it up, so he has not taken anything,

## 2010-09-14 ENCOUNTER — Encounter: Payer: PRIVATE HEALTH INSURANCE | Admitting: Emergency Medicine

## 2010-09-14 NOTE — Telephone Encounter (Signed)
Spoke with patient and he did receive my message from yesterday with dose instruction. He did not come in to office today for 1145am new pt appt because he didn't start medication til yesterday. He took 2.5mg s yesterday. Therefore, pt instructed to continue 2.5mg s daily. Rescheduled his appt for 09/21/10 at 1215pm..tm

## 2010-09-21 ENCOUNTER — Ambulatory Visit (INDEPENDENT_AMBULATORY_CARE_PROVIDER_SITE_OTHER): Payer: PRIVATE HEALTH INSURANCE | Admitting: Emergency Medicine

## 2010-09-21 DIAGNOSIS — Z7901 Long term (current) use of anticoagulants: Secondary | ICD-10-CM | POA: Insufficient documentation

## 2010-09-21 DIAGNOSIS — I82409 Acute embolism and thrombosis of unspecified deep veins of unspecified lower extremity: Secondary | ICD-10-CM

## 2010-09-21 LAB — POCT INR: INR: 1

## 2010-09-28 ENCOUNTER — Ambulatory Visit (INDEPENDENT_AMBULATORY_CARE_PROVIDER_SITE_OTHER): Payer: PRIVATE HEALTH INSURANCE | Admitting: Emergency Medicine

## 2010-09-28 DIAGNOSIS — I82409 Acute embolism and thrombosis of unspecified deep veins of unspecified lower extremity: Secondary | ICD-10-CM

## 2010-09-28 LAB — POCT INR: INR: 1.1

## 2010-10-05 ENCOUNTER — Ambulatory Visit (INDEPENDENT_AMBULATORY_CARE_PROVIDER_SITE_OTHER): Payer: PRIVATE HEALTH INSURANCE | Admitting: Emergency Medicine

## 2010-10-05 DIAGNOSIS — I82409 Acute embolism and thrombosis of unspecified deep veins of unspecified lower extremity: Secondary | ICD-10-CM

## 2010-10-05 LAB — POCT INR: INR: 1.4

## 2010-10-12 ENCOUNTER — Encounter: Payer: PRIVATE HEALTH INSURANCE | Admitting: Emergency Medicine

## 2010-10-14 ENCOUNTER — Ambulatory Visit: Payer: PRIVATE HEALTH INSURANCE | Admitting: Cardiology

## 2010-10-19 ENCOUNTER — Ambulatory Visit (INDEPENDENT_AMBULATORY_CARE_PROVIDER_SITE_OTHER): Payer: PRIVATE HEALTH INSURANCE | Admitting: Emergency Medicine

## 2010-10-19 ENCOUNTER — Telehealth: Payer: Self-pay | Admitting: Cardiology

## 2010-10-19 DIAGNOSIS — I82409 Acute embolism and thrombosis of unspecified deep veins of unspecified lower extremity: Secondary | ICD-10-CM

## 2010-10-19 LAB — POCT INR: INR: 1.3

## 2010-10-19 NOTE — Telephone Encounter (Signed)
I cannot comment on the surgical risk.  This would need to come from the surgeon.  After surgery the patient needs to resume coumadin per the surgeons suggestions.  His biggest risk for future thromboembolism is going to be because of failure to take the meds or comply with follow up.

## 2010-10-19 NOTE — Telephone Encounter (Signed)
Spoke with pt who reports he cut hand on Monday and is scheduled for surgery this Friday-Sept. 7, 2012 to repair tendon in his hand. Pt states surgery is to be done by Dr. Linton Rump in [place]--phone [phone]. Pt states he missed coumadin clinic appointment in [place] last week and is going for coumadin appointment today. He will make coumadin clinic aware of planned surgery at this time.  Pt also states he is to have surgery on his leg by Dr. Linton Rump on Sept. 7, 2012 also.  I told pt that I did not know if we had received any information in office for surgical clearance or request to hold coumadin prior to surgery.  I told pt I would forward information to Dr. Percival Spanish and coumadin clinic in [place] and we would be in contact with him regarding their recommendations.

## 2010-10-19 NOTE — Telephone Encounter (Signed)
Called to verify they are aware of pt's recent diagnosed non-occusive thrombus and that the pt was just started on Coumadin 09/08/10. Receptionist verified with Bobby Nones, PA who did the surgical work-up on this pt that she did know he was on Coumadin and advised pt to start holding yesterday. Aware of recent non-occlusive thrombus in same leg they are doing surgery on Friday. Saw pt in Coumadin clinic in [place] today, pt's INR was 1.3 and he has yet to be therapeutic on Coumadin.  Pt missed his last follow-up appt on 10/12/10 and has only been taking 7.5mg  Coumadin daily since last check instead of 10mg  daily as instructed.  Pt also reports missing dosages. Pt is concerned about surgical risks assoc with operating on same leg as DVT and would like to know Dr Hochrein's recommendations.

## 2010-10-19 NOTE — Telephone Encounter (Signed)
Pt on coumadin , having surgery on his leg, same leg that has blood clot in it, also his needs to go off coumadin, didn't take dose last night because he cut his hand last night, will be having coumadin checked today in [place], pls call [phone]

## 2010-10-20 NOTE — Telephone Encounter (Signed)
Called spoke with pt, advised of Dr Hochrein's recommendations.

## 2010-11-02 ENCOUNTER — Telehealth: Payer: Self-pay

## 2010-11-02 ENCOUNTER — Ambulatory Visit (INDEPENDENT_AMBULATORY_CARE_PROVIDER_SITE_OTHER): Payer: PRIVATE HEALTH INSURANCE | Admitting: Emergency Medicine

## 2010-11-02 DIAGNOSIS — I82409 Acute embolism and thrombosis of unspecified deep veins of unspecified lower extremity: Secondary | ICD-10-CM

## 2010-11-02 DIAGNOSIS — I2510 Atherosclerotic heart disease of native coronary artery without angina pectoris: Secondary | ICD-10-CM

## 2010-11-02 LAB — POCT INR: INR: 2.5

## 2010-11-02 MED ORDER — NITROGLYCERIN 0.4 MG SL SUBL: 0.4000 mg | SUBLINGUAL_TABLET | SUBLINGUAL | Status: DC | PRN

## 2010-11-02 NOTE — Telephone Encounter (Signed)
Pt states he had CP last night, and used 2 of the NTG tablets which were dated 2006, with relief. No CP since. Pt states he has a bottle of SL NTG to take prn, but it is out of date and needs a refill on rx. Please send rx to [facility] in [place].

## 2010-11-08 LAB — CBC
HCT: 43.9
HCT: 44.2
HCT: 44.2
HCT: 44.9
HCT: 46
HCT: 47.1
Hemoglobin: 15.1
Hemoglobin: 15.2
Hemoglobin: 15.6
Hemoglobin: 15.6
Hemoglobin: 15.7
Hemoglobin: 15.9
MCHC: 33.7
MCHC: 33.9
MCHC: 34.2
MCHC: 34.4
MCHC: 35.1
MCHC: 35.6
MCV: 86.5
MCV: 86.8
MCV: 87
MCV: 87.1
MCV: 87.1
MCV: 87.4
Platelets: 145 — ABNORMAL LOW
Platelets: 154
Platelets: 154
Platelets: 156
Platelets: 164
Platelets: 177
RBC: 5.05
RBC: 5.1
RBC: 5.11
RBC: 5.16
RBC: 5.27
RBC: 5.39
RDW: 13.2
RDW: 13.2
RDW: 13.3
RDW: 13.5
RDW: 13.5
RDW: 13.7
WBC: 10.5
WBC: 10.9 — ABNORMAL HIGH
WBC: 8.5
WBC: 9
WBC: 9.4
WBC: 9.7

## 2010-11-08 LAB — HEPARIN LEVEL (UNFRACTIONATED)
Heparin Unfractionated: 0.35
Heparin Unfractionated: 0.35
Heparin Unfractionated: 0.37
Heparin Unfractionated: 0.41
Heparin Unfractionated: 0.57

## 2010-11-08 LAB — DIFFERENTIAL
Basophils Absolute: 0
Basophils Relative: 0
Eosinophils Absolute: 0.2
Eosinophils Relative: 2
Lymphocytes Relative: 32
Lymphs Abs: 3
Monocytes Absolute: 1.1 — ABNORMAL HIGH
Monocytes Relative: 12
Neutro Abs: 5.1
Neutrophils Relative %: 54

## 2010-11-08 LAB — COMPREHENSIVE METABOLIC PANEL
ALT: 24
ALT: 24
AST: 23
AST: 23
Albumin: 3.7
Albumin: 3.8
Alkaline Phosphatase: 47
Alkaline Phosphatase: 50
BUN: 12
BUN: 13
CO2: 22
CO2: 26
Calcium: 9.1
Calcium: 9.2
Chloride: 102
Chloride: 96
Creatinine, Ser: 0.99
Creatinine, Ser: 1.02
GFR calc Af Amer: >60
GFR calc Af Amer: >60
GFR calc non Af Amer: >60
GFR calc non Af Amer: >60
Glucose, Bld: 228 — ABNORMAL HIGH
Glucose, Bld: 249 — ABNORMAL HIGH
Potassium: 3.7
Potassium: 4
Sodium: 131 — ABNORMAL LOW
Sodium: 135
Total Bilirubin: 0.7
Total Bilirubin: 1
Total Protein: 6.7
Total Protein: 7.2

## 2010-11-08 LAB — PROTIME-INR
INR: 1
INR: 1.1
INR: 1.1
INR: 1.1
INR: 1.5
Prothrombin Time: 13.8
Prothrombin Time: 14.1
Prothrombin Time: 14.4
Prothrombin Time: 14.7
Prothrombin Time: 18.9 — ABNORMAL HIGH

## 2010-11-08 LAB — POCT I-STAT, CHEM 8
BUN: 15
Calcium, Ion: 1.12
Chloride: 103
Creatinine, Ser: 1.2
Glucose, Bld: 244 — ABNORMAL HIGH
HCT: 47
Hemoglobin: 16
Potassium: 4
Sodium: 135
TCO2: 24

## 2010-11-08 LAB — TSH: TSH: 2.858

## 2010-11-08 LAB — LIPID PANEL
Cholesterol: 136
HDL: 30 — ABNORMAL LOW
LDL Cholesterol: 53
Total CHOL/HDL Ratio: 4.5
Triglycerides: 267 — ABNORMAL HIGH
VLDL: 53 — ABNORMAL HIGH

## 2010-11-08 LAB — APTT
aPTT: 28
aPTT: 96 — ABNORMAL HIGH

## 2010-11-08 LAB — CARDIAC PANEL(CRET KIN+CKTOT+MB+TROPI)
CK, MB: 3.3
Relative Index: 1.9
Total CK: 174
Troponin I: 0.02

## 2010-11-08 LAB — TROPONIN I: Troponin I: 0.02

## 2010-11-08 LAB — CK TOTAL AND CKMB (NOT AT ARMC)
CK, MB: 3.7
Relative Index: 2
Total CK: 186

## 2010-11-09 ENCOUNTER — Encounter: Payer: PRIVATE HEALTH INSURANCE | Admitting: Emergency Medicine

## 2010-11-10 LAB — BASIC METABOLIC PANEL
BUN: 14
CO2: 27
Calcium: 9.3
Chloride: 102
Creatinine, Ser: 0.96
GFR calc Af Amer: >60
GFR calc non Af Amer: >60
Glucose, Bld: 263 — ABNORMAL HIGH
Potassium: 4.5
Sodium: 135

## 2010-11-10 LAB — CBC
HCT: 45.2
Hemoglobin: 15.2
MCHC: 33.6
MCV: 86.1
Platelets: 183
RBC: 5.25
RDW: 13.5
WBC: 7.4

## 2010-11-10 LAB — PROTIME-INR
INR: 2.5 — ABNORMAL HIGH
Prothrombin Time: 27.4 — ABNORMAL HIGH

## 2010-12-01 ENCOUNTER — Ambulatory Visit (INDEPENDENT_AMBULATORY_CARE_PROVIDER_SITE_OTHER): Payer: PRIVATE HEALTH INSURANCE | Admitting: *Deleted

## 2010-12-01 DIAGNOSIS — Z7901 Long term (current) use of anticoagulants: Secondary | ICD-10-CM

## 2010-12-01 DIAGNOSIS — I82409 Acute embolism and thrombosis of unspecified deep veins of unspecified lower extremity: Secondary | ICD-10-CM

## 2010-12-01 LAB — POCT INR: INR: 2.6

## 2010-12-26 ENCOUNTER — Ambulatory Visit (INDEPENDENT_AMBULATORY_CARE_PROVIDER_SITE_OTHER): Payer: PRIVATE HEALTH INSURANCE | Admitting: Cardiology

## 2010-12-26 ENCOUNTER — Encounter: Payer: Self-pay | Admitting: Cardiology

## 2010-12-26 ENCOUNTER — Ambulatory Visit (INDEPENDENT_AMBULATORY_CARE_PROVIDER_SITE_OTHER): Payer: PRIVATE HEALTH INSURANCE | Admitting: *Deleted

## 2010-12-26 DIAGNOSIS — Z7901 Long term (current) use of anticoagulants: Secondary | ICD-10-CM

## 2010-12-26 DIAGNOSIS — E669 Obesity, unspecified: Secondary | ICD-10-CM

## 2010-12-26 DIAGNOSIS — M79606 Pain in leg, unspecified: Secondary | ICD-10-CM

## 2010-12-26 DIAGNOSIS — R229 Localized swelling, mass and lump, unspecified: Secondary | ICD-10-CM

## 2010-12-26 DIAGNOSIS — I10 Essential (primary) hypertension: Secondary | ICD-10-CM

## 2010-12-26 DIAGNOSIS — R609 Edema, unspecified: Secondary | ICD-10-CM

## 2010-12-26 DIAGNOSIS — I2510 Atherosclerotic heart disease of native coronary artery without angina pectoris: Secondary | ICD-10-CM

## 2010-12-26 DIAGNOSIS — I82409 Acute embolism and thrombosis of unspecified deep veins of unspecified lower extremity: Secondary | ICD-10-CM

## 2010-12-26 DIAGNOSIS — M791 Myalgia, unspecified site: Secondary | ICD-10-CM

## 2010-12-26 DIAGNOSIS — M79609 Pain in unspecified limb: Secondary | ICD-10-CM

## 2010-12-26 DIAGNOSIS — IMO0001 Reserved for inherently not codable concepts without codable children: Secondary | ICD-10-CM

## 2010-12-26 LAB — POCT INR: INR: 2.6

## 2010-12-26 NOTE — Assessment & Plan Note (Addendum)
We have had multiple discussions about weight loss. I have again advised exercise and diet.

## 2010-12-26 NOTE — Assessment & Plan Note (Signed)
He has no new muscle aches.  No further work up is indicated.

## 2010-12-26 NOTE — Assessment & Plan Note (Signed)
I have again suggested salt and fluid restriction and compression stockings. He'll be given another prescription for compression stockings.

## 2010-12-26 NOTE — Patient Instructions (Signed)
Please wear compression stocking daily  Please have protime checked in the [facility] office  Your physician has requested that you have a lower extremity arterial exercise duplex. During this test, exercise and ultrasound are used to evaluate arterial blood flow in the legs. Allow one hour for this exam. There are no restrictions or special instructions.  Follow up with Dr Percival Spanish after the testing.

## 2010-12-26 NOTE — Assessment & Plan Note (Signed)
He had a stress test earlier this year. He has no new symptoms since that study. He will continue with attempted risk reduction though he has not participated in this aggressively.

## 2010-12-26 NOTE — Assessment & Plan Note (Signed)
I plan to repeat a lower extremity doppler in Jan and if there is no further evidence of venous clot I will stop the warfarin.

## 2010-12-26 NOTE — Progress Notes (Signed)
HPI The patient presents for followup of CAD.  Since I last saw him he has had no new chest pain.  He does get dyspneic with significant exertion but this is unchanged. He has no new PND or orthopnea. He's not having any new palpitations, presyncope or syncope. He continues to have lower externally swelling which is unchanged.  The patient denies any new symptoms such as chest discomfort, neck or arm discomfort.  There have been no reported palpitations, presyncope or syncope.   No Known Allergies  Current Outpatient Prescriptions  Medication Sig Dispense Refill  . esomeprazole (NEXIUM) 40 MG capsule Take 40 mg by mouth as needed.        . fish oil-omega-3 fatty acids 1000 MG capsule Take 2 g by mouth daily.        Marland Kitchen guaiFENesin (MUCINEX) 600 MG 12 hr tablet Take 1,200 mg by mouth as directed.        . insulin lispro (HUMALOG KWIKPEN) 100 UNIT/ML injection Inject into the skin 3 (three) times daily before meals. UAD        . losartan (COZAAR) 50 MG tablet Take 1 tablet (50 mg total) by mouth 2 (two) times daily.  60 tablet  11  . metoprolol succinate (TOPROL-XL) 25 MG 24 hr tablet Take 25 mg by mouth daily.       . nitroGLYCERIN (NITROSTAT) 0.4 MG SL tablet Place 1 tablet (0.4 mg total) under the tongue every 5 (five) minutes as needed.  25 tablet  6  . NON FORMULARY Instead of lantus pt taking neviemar       . warfarin (COUMADIN) 5 MG tablet Take as directed by Anticoagulation clinic   45 tablet  3    Past Medical History  Diagnosis Date  . CAD (coronary artery disease)     status post CABG (I do not have the full details of this. He had an occluded saphenous vein graft to OM, PDA after CABG. he had a cypher stent to the right coronary artery. This is apparently a 4-vesel CABG in 2002.    Marland Kitchen HTN (hypertension)     x 15 years  . Dyslipidemia   . Diabetes mellitus   . Peripheral neuropathy   . Charcot's joint of knee     UNSPECIFIED AREA  . Pulmonary embolism   . Deep venous thrombosis      right lower extremity    Past Surgical History  Procedure Date  . Left knee surgery   . Coronary artery bypass graft     4 time since 2002    ROS:  Erectile dysfunction.  Otherwise as stated in the HPI and negative for all other systems.   PHYSICAL EXAM BP 139/77  Pulse 62  Ht 6' (1.829 m)  Wt 270 lb (122.471 kg)  BMI 36.62 kg/m2 GENERAL:  Well appearing HEENT:  Pupils equal round and reactive, fundi not visualized, oral mucosa unremarkable NECK:  No jugular venous distention, waveform within normal limits, carotid upstroke brisk and symmetric, no bruits, no thyromegaly LYMPHATICS:  No cervical, inguinal adenopathy LUNGS:  Clear to auscultation bilaterally BACK:  No CVA tenderness CHEST:  Well healed sternotomy scar. HEART:  PMI not displaced or sustained,S1 and S2 within normal limits, no S3, no S4, no clicks, no rubs, no murmurs ABD:  Flat, positive bowel sounds normal in frequency in pitch, no bruits, no rebound, no guarding, no midline pulsatile mass, no hepatomegaly, no splenomegaly, obese EXT:  2 plus pulses throughout, right lower  leg swelling and mild edema, no cyanosis no clubbing, charcot joints SKIN:  No rashes no nodules, lipomas NEURO:  Cranial nerves II through XII grossly intact, motor grossly intact throughout PSYCH:  Cognitively intact, oriented to person place and time  ASSESSMENT AND PLAN

## 2010-12-26 NOTE — Assessment & Plan Note (Signed)
The blood pressure is at target. No change in medications is indicated. We will continue with therapeutic lifestyle changes (TLC).  

## 2010-12-27 ENCOUNTER — Encounter: Payer: PRIVATE HEALTH INSURANCE | Admitting: *Deleted

## 2010-12-30 ENCOUNTER — Telehealth: Payer: Self-pay | Admitting: Cardiology

## 2010-12-30 MED ORDER — WARFARIN SODIUM 5 MG PO TABS: ORAL_TABLET | ORAL | Status: DC

## 2010-12-30 NOTE — Telephone Encounter (Signed)
F/up previous problem Pt wants refill of coumadin to [facility] [phone] He called yesterday and now he is out of pills  please call him when done

## 2010-12-30 NOTE — Telephone Encounter (Signed)
Rx sent 

## 2011-01-06 ENCOUNTER — Encounter: Payer: PRIVATE HEALTH INSURANCE | Admitting: *Deleted

## 2011-02-01 ENCOUNTER — Ambulatory Visit (INDEPENDENT_AMBULATORY_CARE_PROVIDER_SITE_OTHER): Payer: PRIVATE HEALTH INSURANCE | Admitting: Emergency Medicine

## 2011-02-01 DIAGNOSIS — Z7901 Long term (current) use of anticoagulants: Secondary | ICD-10-CM

## 2011-02-01 DIAGNOSIS — I82409 Acute embolism and thrombosis of unspecified deep veins of unspecified lower extremity: Secondary | ICD-10-CM

## 2011-02-01 LAB — POCT INR: INR: 2.7

## 2011-02-03 ENCOUNTER — Encounter: Payer: PRIVATE HEALTH INSURANCE | Admitting: *Deleted

## 2011-03-03 ENCOUNTER — Encounter (INDEPENDENT_AMBULATORY_CARE_PROVIDER_SITE_OTHER): Payer: PRIVATE HEALTH INSURANCE | Admitting: *Deleted

## 2011-03-03 ENCOUNTER — Other Ambulatory Visit: Payer: Self-pay | Admitting: *Deleted

## 2011-03-03 DIAGNOSIS — I8000 Phlebitis and thrombophlebitis of superficial vessels of unspecified lower extremity: Secondary | ICD-10-CM

## 2011-03-03 DIAGNOSIS — M7989 Other specified soft tissue disorders: Secondary | ICD-10-CM

## 2011-03-03 DIAGNOSIS — M79606 Pain in leg, unspecified: Secondary | ICD-10-CM

## 2011-03-03 DIAGNOSIS — R609 Edema, unspecified: Secondary | ICD-10-CM

## 2011-03-03 MED ORDER — WARFARIN SODIUM 5 MG PO TABS: ORAL_TABLET | ORAL | Status: DC

## 2011-03-07 ENCOUNTER — Encounter: Payer: Self-pay | Admitting: Cardiology

## 2011-03-07 ENCOUNTER — Ambulatory Visit (INDEPENDENT_AMBULATORY_CARE_PROVIDER_SITE_OTHER): Payer: PRIVATE HEALTH INSURANCE | Admitting: Cardiology

## 2011-03-07 VITALS — BP 130/80 | HR 63 | Ht 72.0 in | Wt 272.0 lb

## 2011-03-07 DIAGNOSIS — E669 Obesity, unspecified: Secondary | ICD-10-CM

## 2011-03-07 DIAGNOSIS — I82409 Acute embolism and thrombosis of unspecified deep veins of unspecified lower extremity: Secondary | ICD-10-CM

## 2011-03-07 DIAGNOSIS — I2510 Atherosclerotic heart disease of native coronary artery without angina pectoris: Secondary | ICD-10-CM

## 2011-03-07 DIAGNOSIS — I10 Essential (primary) hypertension: Secondary | ICD-10-CM

## 2011-03-07 MED ORDER — NITROGLYCERIN 0.4 MG SL SUBL: 0.4000 mg | SUBLINGUAL_TABLET | SUBLINGUAL | Status: DC | PRN

## 2011-03-07 NOTE — Assessment & Plan Note (Signed)
The blood pressure is at target. No change in medications is indicated. We will continue with therapeutic lifestyle changes (TLC).  

## 2011-03-07 NOTE — Patient Instructions (Signed)
The current medical regimen is effective;  continue present plan and medications.  Follow up in 6 months with Dr Hochrein.  You will receive a letter in the mail 2 months before you are due.  Please call us when you receive this letter to schedule your follow up appointment.  

## 2011-03-07 NOTE — Assessment & Plan Note (Signed)
The patient has no new sypmtoms.  No further cardiovascular testing is indicated.  We will continue with aggressive risk reduction and meds as listed.  I have tried to get him to comply with risk reduction in the past and I have been unsuccessful.

## 2011-03-07 NOTE — Progress Notes (Signed)
HPI The patient presents for followup of DVT.  He has been on Coumadin off and on since massive DVT and pulmonary embolism a few years ago. Last summer he had recurrent swelling in his leg with some new clot formation. He has been on anticoagulation since then. He has known coronary disease but is not having any new symptoms. He's not having any chest pressure, neck or arm discomfort. He's not having any palpitations, presyncope or syncope. He has chronic dyspnea with exertion which is unchanged. He's actually having a little slightly less lower extremity swelling and he was previously. This comes and goes. His hemoglobin A1c was 12.5! This is followed by endocrinology at [facility]. He doesn't participate well in risk reduction and never has.  Of note I'm reviewing lower strandy Doppler today that demonstrates isolated occlusive thrombus in the gastrocnemius veins.   No Known Allergies  Current Outpatient Prescriptions  Medication Sig Dispense Refill  . fish oil-omega-3 fatty acids 1000 MG capsule Take 2 g by mouth daily.        . insulin lispro (HUMALOG KWIKPEN) 100 UNIT/ML injection Inject into the skin 3 (three) times daily before meals. UAD        . losartan (COZAAR) 50 MG tablet Take 1 tablet (50 mg total) by mouth 2 (two) times daily.  60 tablet  11  . metoprolol succinate (TOPROL-XL) 25 MG 24 hr tablet Take 25 mg by mouth daily.       . NON FORMULARY Instead of lantus pt taking neviemar       . warfarin (COUMADIN) 5 MG tablet Take as directed by Anticoagulation clinic.  Pt takes up to 2 tablets daily.  60 tablet  1  . guaiFENesin (MUCINEX) 600 MG 12 hr tablet Take 1,200 mg by mouth as directed.        . nitroGLYCERIN (NITROSTAT) 0.4 MG SL tablet Place 1 tablet (0.4 mg total) under the tongue every 5 (five) minutes as needed.  25 tablet  6    Past Medical History  Diagnosis Date  . CAD (coronary artery disease)     status post CABG (I do not have the full details of this. He had an occluded  saphenous vein graft to OM, PDA after CABG. he had a cypher stent to the right coronary artery. This is apparently a 4-vesel CABG in 2002.    Marland Kitchen HTN (hypertension)     x 15 years  . Dyslipidemia   . Diabetes mellitus   . Peripheral neuropathy   . Charcot's joint of knee     UNSPECIFIED AREA  . Pulmonary embolism   . Deep venous thrombosis     right lower extremity    Past Surgical History  Procedure Date  . Left knee surgery   . Coronary artery bypass graft     4 time since 2002    ROS:  Erectile dysfunction.  Otherwise as stated in the HPI and negative for all other systems.   PHYSICAL EXAM BP 130/80  Pulse 63  Ht 6' (1.829 m)  Wt 272 lb (123.378 kg)  BMI 36.89 kg/m2 GENERAL:  Well appearing HEENT:  Pupils equal round and reactive, fundi not visualized, oral mucosa unremarkable NECK:  No jugular venous distention, waveform within normal limits, carotid upstroke brisk and symmetric, no bruits, no thyromegaly LYMPHATICS:  No cervical, inguinal adenopathy LUNGS:  Clear to auscultation bilaterally BACK:  No CVA tenderness CHEST:  Well healed sternotomy scar. HEART:  PMI not displaced or sustained,S1  and S2 within normal limits, no S3, no S4, no clicks, no rubs, no murmurs ABD:  Flat, positive bowel sounds normal in frequency in pitch, no bruits, no rebound, no guarding, no midline pulsatile mass, no hepatomegaly, no splenomegaly, obese EXT:  2 plus pulses throughout, right lower leg swelling and mild edema, no cyanosis no clubbing, charcot joints SKIN:  No rashes no nodules, lipomas NEURO:  Cranial nerves II through XII grossly intact, motor grossly intact throughout PSYCH:  Cognitively intact, oriented to person place and time  EKG:  Sinus rhythm, rate 63, axis within normal limits, intervals limits within normal limits, no acute ST-T wave changes.  ASSESSMENT AND PLAN

## 2011-03-07 NOTE — Assessment & Plan Note (Signed)
I have discussed weight loss with him on several occasions.  He has been unable to participate in any activity that will help with this.

## 2011-03-07 NOTE — Assessment & Plan Note (Signed)
I had a long discussion with the patient. He has had massive clot burden in the past with pulmonary embolism. This summer he had some lower extremity thrombus as described previously. Despite the fact that this is not classic or high risk currently I think his overall situation is high risk for recurrent DVT and subsequent embolization. He has absolutely no problems taking the Coumadin. He understands the risk benefits and we have decided to continue with this.

## 2011-03-15 ENCOUNTER — Encounter: Payer: PRIVATE HEALTH INSURANCE | Admitting: Emergency Medicine

## 2011-04-12 ENCOUNTER — Ambulatory Visit (INDEPENDENT_AMBULATORY_CARE_PROVIDER_SITE_OTHER): Payer: PRIVATE HEALTH INSURANCE | Admitting: *Deleted

## 2011-04-12 ENCOUNTER — Other Ambulatory Visit: Payer: Self-pay | Admitting: Cardiology

## 2011-04-12 DIAGNOSIS — I82409 Acute embolism and thrombosis of unspecified deep veins of unspecified lower extremity: Secondary | ICD-10-CM

## 2011-04-12 DIAGNOSIS — Z7901 Long term (current) use of anticoagulants: Secondary | ICD-10-CM

## 2011-04-12 LAB — POCT INR: INR: 1.8

## 2011-04-12 MED ORDER — WARFARIN SODIUM 5 MG PO TABS: ORAL_TABLET | ORAL | Status: DC

## 2011-04-12 NOTE — Telephone Encounter (Signed)
New Msg: Pharmacy calling to check on status of pt refill request for warfarin. Pt is out of medication. Please call refill in ASAP.

## 2011-05-03 ENCOUNTER — Telehealth: Payer: Self-pay | Admitting: Cardiology

## 2011-05-03 ENCOUNTER — Emergency Department (HOSPITAL_COMMUNITY): Payer: PRIVATE HEALTH INSURANCE

## 2011-05-03 ENCOUNTER — Encounter (HOSPITAL_COMMUNITY): Payer: Self-pay | Admitting: *Deleted

## 2011-05-03 ENCOUNTER — Emergency Department (HOSPITAL_COMMUNITY)
Admission: EM | Admit: 2011-05-03 | Discharge: 2011-05-03 | Disposition: A | Discharge status: Home / Self Care | Payer: PRIVATE HEALTH INSURANCE | Attending: Emergency Medicine | Admitting: Emergency Medicine

## 2011-05-03 ENCOUNTER — Other Ambulatory Visit: Payer: Self-pay

## 2011-05-03 DIAGNOSIS — L02212 Cutaneous abscess of back [any part, except buttock]: Secondary | ICD-10-CM

## 2011-05-03 DIAGNOSIS — L03319 Cellulitis of trunk, unspecified: Secondary | ICD-10-CM | POA: Insufficient documentation

## 2011-05-03 DIAGNOSIS — L02219 Cutaneous abscess of trunk, unspecified: Secondary | ICD-10-CM | POA: Insufficient documentation

## 2011-05-03 DIAGNOSIS — I2510 Atherosclerotic heart disease of native coronary artery without angina pectoris: Secondary | ICD-10-CM | POA: Insufficient documentation

## 2011-05-03 DIAGNOSIS — I10 Essential (primary) hypertension: Secondary | ICD-10-CM | POA: Insufficient documentation

## 2011-05-03 DIAGNOSIS — Z86711 Personal history of pulmonary embolism: Secondary | ICD-10-CM | POA: Insufficient documentation

## 2011-05-03 DIAGNOSIS — E785 Hyperlipidemia, unspecified: Secondary | ICD-10-CM | POA: Insufficient documentation

## 2011-05-03 DIAGNOSIS — Z79899 Other long term (current) drug therapy: Secondary | ICD-10-CM | POA: Insufficient documentation

## 2011-05-03 DIAGNOSIS — Z86718 Personal history of other venous thrombosis and embolism: Secondary | ICD-10-CM | POA: Insufficient documentation

## 2011-05-03 DIAGNOSIS — Z951 Presence of aortocoronary bypass graft: Secondary | ICD-10-CM | POA: Insufficient documentation

## 2011-05-03 DIAGNOSIS — Z794 Long term (current) use of insulin: Secondary | ICD-10-CM | POA: Insufficient documentation

## 2011-05-03 DIAGNOSIS — Z7901 Long term (current) use of anticoagulants: Secondary | ICD-10-CM | POA: Insufficient documentation

## 2011-05-03 DIAGNOSIS — E119 Type 2 diabetes mellitus without complications: Secondary | ICD-10-CM | POA: Insufficient documentation

## 2011-05-03 DIAGNOSIS — R079 Chest pain, unspecified: Secondary | ICD-10-CM | POA: Insufficient documentation

## 2011-05-03 LAB — CBC
HCT: 45.4 % (ref 39.0–52.0)
Hemoglobin: 16.3 g/dL (ref 13.0–17.0)
MCH: 30 pg (ref 26.0–34.0)
MCHC: 35.9 g/dL (ref 30.0–36.0)
MCV: 83.5 fL (ref 78.0–100.0)
Platelets: 172 10*3/uL (ref 150–400)
RBC: 5.44 MIL/uL (ref 4.22–5.81)
RDW: 13.3 % (ref 11.5–15.5)
WBC: 9.7 10*3/uL (ref 4.0–10.5)

## 2011-05-03 LAB — POCT I-STAT, CHEM 8
BUN: 15 mg/dL (ref 6–23)
Calcium, Ion: 1.26 mmol/L (ref 1.12–1.32)
Chloride: 100 mEq/L (ref 96–112)
Creatinine, Ser: 0.9 mg/dL (ref 0.50–1.35)
Glucose, Bld: 230 mg/dL — ABNORMAL HIGH (ref 70–99)
HCT: 50 % (ref 39.0–52.0)
Hemoglobin: 17 g/dL (ref 13.0–17.0)
Potassium: 4.4 mEq/L (ref 3.5–5.1)
Sodium: 137 mEq/L (ref 135–145)
TCO2: 28 mmol/L (ref 0–100)

## 2011-05-03 LAB — POCT I-STAT TROPONIN I: Troponin i, poc: 0 ng/mL (ref 0.00–0.08)

## 2011-05-03 MED ORDER — LIDOCAINE HCL 2 % IJ SOLN: 5.0000 mL | Freq: Once | INTRAMUSCULAR | Status: DC

## 2011-05-03 MED ORDER — SULFAMETHOXAZOLE-TRIMETHOPRIM 800-160 MG PO TABS: 1.0000 | ORAL_TABLET | Freq: Two times a day (BID) | ORAL | Status: AC

## 2011-05-03 MED ORDER — HYDROCODONE-ACETAMINOPHEN 5-325 MG PO TABS: 1.0000 | ORAL_TABLET | ORAL | Status: AC | PRN

## 2011-05-03 NOTE — ED Notes (Signed)
Pt states last night started having pain to left shoulder/back area that began radiating into left side of chest. Denies any nausea, diaphoresis or sob. Pt reports the area just feels sore right now. States discomfort was severe when he was laying down to sleep. States took 2 nitros last night, reports temporary relief.

## 2011-05-03 NOTE — Telephone Encounter (Signed)
Spoke with pt. He states his back has been sore for last 4-5 days. Did not take anything for this and hoped it would go away on own. Last night pain in back went through to chest. He took 2 NTG with little relief. Describes as throbbing type pain. No pain this AM but pt requesting to be seen today. No shortness of breath like when he had PE.  Is taking coumadin as instructed.  Will review with Dr. Percival Spanish

## 2011-05-03 NOTE — ED Notes (Signed)
iand set-up

## 2011-05-03 NOTE — Telephone Encounter (Signed)
Pt is to report to the ED. He is aware and will report to [facility]

## 2011-05-03 NOTE — Telephone Encounter (Signed)
Pt had chest pain last night, "sort of constant" took two nitro's, did have back pain two days prior, denies SOB, chest sore today, pls advise, pt requesting appt, pls call [phone]

## 2011-05-03 NOTE — Discharge Instructions (Signed)
Return in 2 days to have the packing removed. You may also have this done at your doctor's office.  Abscess An abscess (boil or furuncle) is an infected area that contains a collection of pus.  SYMPTOMS Signs and symptoms of an abscess include pain, tenderness, redness, or hardness. You may feel a moveable soft area under your skin. An abscess can occur anywhere in the body.  TREATMENT  A surgical cut (incision) may be made over your abscess to drain the pus. Gauze may be packed into the space or a drain may be looped through the abscess cavity (pocket). This provides a drain that will allow the cavity to heal from the inside outwards. The abscess may be painful for a few days, but should feel much better if it was drained.  Your abscess, if seen early, may not have localized and may not have been drained. If not, another appointment may be required if it does not get better on its own or with medications. HOME CARE INSTRUCTIONS   Only take over-the-counter or prescription medicines for pain, discomfort, or fever as directed by your caregiver.   Take your antibiotics as directed if they were prescribed. Finish them even if you start to feel better.   Keep the skin and clothes clean around your abscess.   If the abscess was drained, you will need to use gauze dressing to collect any draining pus. Dressings will typically need to be changed 3 or more times a day.   The infection may spread by skin contact with others. Avoid skin contact as much as possible.   Practice good hygiene. This includes regular hand washing, cover any draining skin lesions, and do not share personal care items.   If you participate in sports, do not share athletic equipment, towels, whirlpools, or personal care items. Shower after every practice or tournament.   If a draining area cannot be adequately covered:   Do not participate in sports.   Children should not participate in day care until the wound has healed  or drainage stops.   If your caregiver has given you a follow-up appointment, it is very important to keep that appointment. Not keeping the appointment could result in a much worse infection, chronic or permanent injury, pain, and disability. If there is any problem keeping the appointment, you must call back to this facility for assistance.  SEEK MEDICAL CARE IF:   You develop increased pain, swelling, redness, drainage, or bleeding in the wound site.   You develop signs of generalized infection including muscle aches, chills, fever, or a general ill feeling.   You have an oral temperature above 102 F (38.9 C).  MAKE SURE YOU:   Understand these instructions.   Will watch your condition.   Will get help right away if you are not doing well or get worse.  Document Released: 11/09/2004 Document Revised: 01/19/2011 Document Reviewed: 09/03/2007 [facility] Patient Information 2012 [place].  Acetaminophen; Hydrocodone tablets or capsules What is this medicine? ACETAMINOPHEN; HYDROCODONE (a set a MEE noe fen; hye droe KOE done) is a pain reliever. It is used to treat mild to moderate pain. This medicine may be used for other purposes; ask your health care provider or pharmacist if you have questions. What should I tell my health care provider before I take this medicine? They need to know if you have any of these conditions: -brain tumor -Crohn's disease, inflammatory bowel disease, or ulcerative colitis -drink more than 3 alcohol-containing drinks per  day -drug abuse or addiction -head injury -heart or circulation problems -kidney disease or problems going to the bathroom -liver disease -lung disease, asthma, or breathing problems -an unusual or allergic reaction to acetaminophen, hydrocodone, other opioid analgesics, other medicines, foods, dyes, or preservatives -pregnant or trying to get pregnant -breast-feeding How should I use this medicine? Take this medicine by  mouth. Swallow it with a full glass of water. Follow the directions on the prescription label. If the medicine upsets your stomach, take the medicine with food or milk. Do not take more than you are told to take. Talk to your pediatrician regarding the use of this medicine in children. This medicine is not approved for use in children. Overdosage: If you think you have taken too much of this medicine contact a poison control center or emergency room at once. NOTE: This medicine is only for you. Do not share this medicine with others. What if I miss a dose? If you miss a dose, take it as soon as you can. If it is almost time for your next dose, take only that dose. Do not take double or extra doses. What may interact with this medicine? -alcohol or medicines that contain alcohol -antihistamines -isoniazid -medicines for depression, anxiety, or psychotic disturbances -medicines for pain including pentazocine, buprenorphine, butorphanol, nalbuphine, tramadol, and propoxyphene -medicines for sleep -muscle relaxants -naltrexone -phenobarbital -ritonavir This list may not describe all possible interactions. Give your health care provider a list of all the medicines, herbs, non-prescription drugs, or dietary supplements you use. Also tell them if you smoke, drink alcohol, or use illegal drugs. Some items may interact with your medicine. What should I watch for while using this medicine? Tell your doctor or health care professional if your pain does not go away, if it gets worse, or if you have new or a different type of pain. You may develop tolerance to the medicine. Tolerance means that you will need a higher dose of the medicine for pain relief. Tolerance is normal and is expected if you take the medicine for a long time. Do not suddenly stop taking your medicine because you may develop a severe reaction. Your body becomes used to the medicine. This does NOT mean you are addicted. Addiction is a  behavior related to getting and using a drug for a non-medical reason. If you have pain, you have a medical reason to take pain medicine. Your doctor will tell you how much medicine to take. If your doctor wants you to stop the medicine, the dose will be slowly lowered over time to avoid any side effects. You may get drowsy or dizzy when you first start taking the medicine or change doses. Do not drive, use machinery, or do anything that may be dangerous until you know how the medicine affects you. Stand or sit up slowly. The medicine will cause constipation. Try to have a bowel movement at least every 2 to 3 days. If you do not have a bowel movement for 3 days, call your doctor or health care professional. Too much acetaminophen can be very dangerous. Do not take Tylenol (acetaminophen) or medicines that contain acetaminophen with this medicine. Many non-prescription medicines contain acetaminophen. Always read the labels carefully. What side effects may I notice from receiving this medicine? Side effects that you should report to your doctor or health care professional as soon as possible: -allergic reactions like skin rash, itching or hives, swelling of the face, lips, or tongue -breathing problems -confusion -feeling faint  or lightheaded, falls -stomach pain -yellowing of the eyes or skin Side effects that usually do not require medical attention (report to your doctor or health care professional if they continue or are bothersome): -nausea, vomiting -stomach upset This list may not describe all possible side effects. Call your doctor for medical advice about side effects. You may report side effects to FDA at 1-800-FDA-1088. Where should I keep my medicine? Keep out of the reach of children. This medicine can be abused. Keep your medicine in a safe place to protect it from theft. Do not share this medicine with anyone. Selling or giving away this medicine is dangerous and against the  law. Store at room temperature between 15 and 30 degrees C (59 and 86 degrees F). Protect from light. Keep container tightly closed. Throw away any unused medicine after the expiration date. NOTE: This sheet is a summary. It may not cover all possible information. If you have questions about this medicine, talk to your doctor, pharmacist, or health care provider.  2012, [employer]. (04/23/2007 10:25:07 AM) Sulfamethoxazole; Trimethoprim, SMX-TMP tablets What is this medicine? SULFAMETHOXAZOLE; TRIMETHOPRIM or SMX-TMP (suhl fuh meth OK suh zohl; trye METH oh prim) is a combination of a sulfonamide antibiotic and a second antibiotic, trimethoprim. It is used to treat or prevent certain kinds of bacterial infections. It will not work for colds, flu, or other viral infections. This medicine may be used for other purposes; ask your health care provider or pharmacist if you have questions. What should I tell my health care provider before I take this medicine? They need to know if you have any of these conditions: -anemia -asthma -being treated with anticonvulsants -if you frequently drink alcohol containing drinks -kidney disease -liver disease -low level of folic acid or [id number] dehydrogenase -poor nutrition or malabsorption -porphyria -severe allergies -thyroid disorder -an unusual or allergic reaction to sulfamethoxazole, trimethoprim, sulfa drugs, other medicines, foods, dyes, or preservatives -pregnant or trying to get pregnant -breast-feeding How should I use this medicine? Take this medicine by mouth with a full glass of water. Follow the directions on the prescription label. Take your medicine at regular intervals. Do not take it more often than directed. Do not skip doses or stop your medicine early. Talk to your pediatrician regarding the use of this medicine in children. Special care may be needed. This medicine has been used in children as young as 2 months of  age. Overdosage: If you think you have taken too much of this medicine contact a poison control center or emergency room at once. NOTE: This medicine is only for you. Do not share this medicine with others. What if I miss a dose? If you miss a dose, take it as soon as you can. If it is almost time for your next dose, take only that dose. Do not take double or extra doses. What may interact with this medicine? Do not take this medicine with any of the following medications: -aminobenzoate potassium -dofetilide -metronidazole This medicine may also interact with the following medications: -ACE inhibitors like benazepril, enalapril, lisinopril, and ramipril -cyclosporine -digoxin -diuretics -indomethacin -medicines for diabetes -methenamine -methotrexate -phenytoin -potassium supplements -pyrimethamine -sulfinpyrazone -tricyclic antidepressants -warfarin This list may not describe all possible interactions. Give your health care provider a list of all the medicines, herbs, non-prescription drugs, or dietary supplements you use. Also tell them if you smoke, drink alcohol, or use illegal drugs. Some items may interact with your medicine. What should I watch for while using  this medicine? Tell your doctor or health care professional if your symptoms do not improve. Drink several glasses of water a day to reduce the risk of kidney problems. Do not treat diarrhea with over the counter products. Contact your doctor if you have diarrhea that lasts more than 2 days or if it is severe and watery. This medicine can make you more sensitive to the sun. Keep out of the sun. If you cannot avoid being in the sun, wear protective clothing and use a sunscreen. Do not use sun lamps or tanning beds/booths. What side effects may I notice from receiving this medicine? Side effects that you should report to your doctor or health care professional as soon as possible: -allergic reactions like skin rash or  hives, swelling of the face, lips, or tongue -breathing problems -fever or chills, sore throat -irregular heartbeat, chest pain -joint or muscle pain -pain or difficulty passing urine -red pinpoint spots on skin -redness, blistering, peeling or loosening of the skin, including inside the mouth -unusual bleeding or bruising -unusually weak or tired -yellowing of the eyes or skin Side effects that usually do not require medical attention (report to your doctor or health care professional if they continue or are bothersome): -diarrhea -dizziness -headache -loss of appetite -nausea, vomiting -nervousness This list may not describe all possible side effects. Call your doctor for medical advice about side effects. You may report side effects to FDA at 1-800-FDA-1088. Where should I keep my medicine? Keep out of the reach of children. Store at room temperature between 20 to 25 degrees C (68 to 77 degrees F). Protect from light. Throw away any unused medicine after the expiration date. NOTE: This sheet is a summary. It may not cover all possible information. If you have questions about this medicine, talk to your doctor, pharmacist, or health care provider.  2012, [employer]. (10/02/2007 11:32:51 AM)

## 2011-05-03 NOTE — ED Notes (Signed)
The pt  Has a red angry appearing lesion to the lt posterior surface of his chest.  He says he has had this  Lesion for one week.

## 2011-05-03 NOTE — ED Provider Notes (Signed)
History     CSN: [id number]  Arrival date & time 05/03/11  1126   First MD Initiated Contact with Patient 05/03/11 1505      Chief Complaint  Patient presents with  . Chest Pain    (Consider location/radiation/quality/duration/timing/severity/associated sxs/prior treatment) Patient is a 56 y.o. male presenting with chest pain. The history is provided by the patient.  Chest Pain   He has a known history of coronary artery disease status post bypass and stent placement. For the past 5 days, he has been having pain in his right upper back and last night it radiated through to the chest. It is mostly bothering him when he lays on it or puts pressure on it. His wife had noted that the area was red. He denies any dyspnea, nausea, vomiting, or diaphoresis. Pain is dull and throbbing. It was a 5/10 at its worst, and is 3/10 currently. He denies fever or chills.  Past Medical History  Diagnosis Date  . CAD (coronary artery disease)     status post CABG (I do not have the full details of this. He had an occluded saphenous vein graft to OM, PDA after CABG. he had a cypher stent to the right coronary artery. This is apparently a 4-vesel CABG in 2002.    Marland Kitchen HTN (hypertension)     x 15 years  . Dyslipidemia   . Diabetes mellitus   . Peripheral neuropathy   . Charcot's joint of knee     UNSPECIFIED AREA  . Pulmonary embolism   . Deep venous thrombosis     right lower extremity    Past Surgical History  Procedure Date  . Left knee surgery   . Coronary artery bypass graft     4 time since 2002  . Coronary angioplasty with stent placement     Family History  Problem Relation Age of Onset  . Heart disease Mother     History  Substance Use Topics  . Smoking status: Never Smoker   . Smokeless tobacco: Not on file  . Alcohol Use: No      Review of Systems  Cardiovascular: Positive for chest pain.  All other systems reviewed and are negative.    Allergies  Review of  patient's allergies indicates no known allergies.  Home Medications   Current Outpatient Rx  Name Route Sig Dispense Refill  . OMEGA-3 FATTY ACIDS 1000 MG PO CAPS Oral Take 1 g by mouth 2 (two) times daily.     Marland Kitchen HYDROCHLOROTHIAZIDE 25 MG PO TABS Oral Take 25 mg by mouth daily.    . INSULIN DETEMIR 100 UNIT/ML [place] SOLN Subcutaneous Inject 40 Units into the skin 2 (two) times daily.    . INSULIN LISPRO (HUMAN) 100 UNIT/ML [place] SOLN Subcutaneous Inject 20 Units into the skin 3 (three) times daily before meals. UAD     . LOSARTAN POTASSIUM 50 MG PO TABS Oral Take 50 mg by mouth daily.    Marland Kitchen METOPROLOL SUCCINATE ER 25 MG PO TB24 Oral Take 25 mg by mouth daily.     Marland Kitchen NITROGLYCERIN 0.4 MG SL SUBL Sublingual Place 1 tablet (0.4 mg total) under the tongue every 5 (five) minutes as needed. 100 tablet 6    Patient would like in 4 separate bottles please  . WARFARIN SODIUM 5 MG PO TABS Oral Take 10 mg by mouth every evening.      BP 153/92  Pulse 61  Temp(Src) 98.2 F (36.8 C) (Oral)  Resp  17  SpO2 98%  Physical Exam  Nursing note and vitals reviewed.  57 year old male resting comfortably and in no acute distress. Vital signs are significant for mild hypertension with blood pressure 153/92. Oxygen saturation is 98% which is normal. Head is normocephalic and atraumatic. PERRLA, EOMI. Oropharynx is clear. Neck is nontender and supple. Back: There is an area of erythema on the right side of the upper back of with moderate tenderness to palpation over the area. There is no definite fluctuance but there is some slight induration. Lungs are clear without rales, wheezes, rhonchi. Heart has regular rate rhythm without murmur. Abdomen is soft, flat, nontender without masses or hepatosplenomegaly. Extremities have 2+ pitting edema with a 2+ presacral edema. There is no cyanosis or range of motion is present. Skin is warm and dry without rash. Neurologic: Mental status is normal, cranial nerves are intact, there no  focal motor or sensory deficits.  ED Course  INCISION AND DRAINAGE Date/Time: 05/03/2011 4:45 PM Performed by: Delora Fuel Authorized by: Roxanne Mins, Crystal Scarberry Consent: Verbal consent obtained. Written consent not obtained. Risks and benefits: risks, benefits and alternatives were discussed Consent given by: patient Patient understanding: patient states understanding of the procedure being performed Patient consent: the patient's understanding of the procedure matches consent given Procedure consent: procedure consent matches procedure scheduled Relevant documents: relevant documents present and verified Test results: test results available and properly labeled Site marked: the operative site was marked Imaging studies: imaging studies available Required items: required blood products, implants, devices, and special equipment available Patient identity confirmed: verbally with patient and arm band Time out: Immediately prior to procedure a "time out" was called to verify the correct patient, procedure, equipment, support staff and site/side marked as required. Type: abscess Body area: trunk Location details: back Anesthesia: local infiltration Local anesthetic: lidocaine 2% without epinephrine Patient sedated: no Risk factor: coagulopathy Scalpel size: 11 Incision type: single straight Complexity: simple Drainage: purulent Drainage amount: moderate Packing material: 1/4 in iodoform gauze Comments: Drainage was very thick and sebaceous. So this appears to be an infected sebaceous cyst which had become an abscess.   (including critical care time)  Results for orders placed during the hospital encounter of 05/03/11  CBC      Component Value Range   WBC 9.7  4.0 - 10.5 (K/uL)   RBC 5.44  4.22 - 5.81 (MIL/uL)   Hemoglobin 16.3  13.0 - 17.0 (g/dL)   HCT 45.4  39.0 - 52.0 (%)   MCV 83.5  78.0 - 100.0 (fL)   MCH 30.0  26.0 - 34.0 (pg)   MCHC 35.9  30.0 - 36.0 (g/dL)   RDW 13.3  11.5 -  15.5 (%)   Platelets 172  150 - 400 (K/uL)  POCT I-STAT, CHEM 8      Component Value Range   Sodium 137  135 - 145 (mEq/L)   Potassium 4.4  3.5 - 5.1 (mEq/L)   Chloride 100  96 - 112 (mEq/L)   BUN 15  6 - 23 (mg/dL)   Creatinine, Ser 0.90  0.50 - 1.35 (mg/dL)   Glucose, Bld 230 (*) 70 - 99 (mg/dL)   Calcium, Ion 1.26  1.12 - 1.32 (mmol/L)   TCO2 28  0 - 100 (mmol/L)   Hemoglobin 17.0  13.0 - 17.0 (g/dL)   HCT 50.0  39.0 - 52.0 (%)  POCT I-STAT TROPONIN I      Component Value Range   Troponin i, poc 0.00  0.00 - 0.08 (  ng/mL)   Comment 3            Dg Chest 2 View  05/03/2011  *RADIOLOGY REPORT*  Clinical Data: Left chest pain, cough  CHEST - 2 VIEW  Comparison: CT chest of 08/18/2010 and chest x-ray of the same date  Findings: Linear atelectasis or scarring is again noted at the left lung base within the left lower lobe.  No focal infiltrate or effusion is seen.  Cardiomegaly is stable.  Median sternotomy sutures are noted from CABG.  No acute bony abnormality is seen.  IMPRESSION: Linear scarring or atelectasis at the left lung base.  No definite active process.  Original Report Authenticated By: Joretta Bachelor, M.D.      Date: 05/03/2011  Rate: 63  Rhythm: normal sinus rhythm  QRS Axis: right  Intervals: normal  ST/T Wave abnormalities: normal  Conduction Disutrbances:none  Narrative Interpretation: Poor R-wave progression across the precordium. Compared with ECG of 08/17/2010, ST and T changes have normalized.  Old EKG Reviewed: changes noted  Limited bedside ultrasound was done and did confirm a small abscess pocket.   1. Abscess of back       MDM  Abscess involving the right side of the upper back.        Delora Fuel, MD [id number]

## 2011-05-16 DIAGNOSIS — F329 Major depressive disorder, single episode, unspecified: Secondary | ICD-10-CM | POA: Insufficient documentation

## 2011-06-08 ENCOUNTER — Ambulatory Visit (INDEPENDENT_AMBULATORY_CARE_PROVIDER_SITE_OTHER): Payer: PRIVATE HEALTH INSURANCE | Admitting: *Deleted

## 2011-06-08 DIAGNOSIS — I82409 Acute embolism and thrombosis of unspecified deep veins of unspecified lower extremity: Secondary | ICD-10-CM

## 2011-06-08 DIAGNOSIS — Z7901 Long term (current) use of anticoagulants: Secondary | ICD-10-CM

## 2011-06-08 LAB — POCT INR: INR: 2.5

## 2011-06-08 MED ORDER — WARFARIN SODIUM 5 MG PO TABS: 5.0000 mg | ORAL_TABLET | ORAL | Status: DC

## 2011-07-12 ENCOUNTER — Ambulatory Visit (INDEPENDENT_AMBULATORY_CARE_PROVIDER_SITE_OTHER): Payer: PRIVATE HEALTH INSURANCE

## 2011-07-12 DIAGNOSIS — I82409 Acute embolism and thrombosis of unspecified deep veins of unspecified lower extremity: Secondary | ICD-10-CM

## 2011-07-12 DIAGNOSIS — Z7901 Long term (current) use of anticoagulants: Secondary | ICD-10-CM

## 2011-07-12 LAB — POCT INR: INR: 2.4

## 2011-07-18 DIAGNOSIS — R7309 Other abnormal glucose: Secondary | ICD-10-CM | POA: Insufficient documentation

## 2011-08-09 ENCOUNTER — Encounter (INDEPENDENT_AMBULATORY_CARE_PROVIDER_SITE_OTHER): Payer: PRIVATE HEALTH INSURANCE

## 2011-08-09 ENCOUNTER — Telehealth: Payer: Self-pay | Admitting: *Deleted

## 2011-08-09 NOTE — Telephone Encounter (Signed)
Patient walked in today thinking he had an appointment to see Dr Percival Spanish.  At front desk he told Elmo Putt he had chest pains Monday am around 6:00 am and after 3 NTG it finally eased off and just felt tired today.  Discussed with Tera Helper NP and she was willing to work patient in.  Did offfer an appointment to patient to see NP several times but he just wanted to come back and see Dr Percival Spanish tomorrow. Patient did tell me about the chest pain episode on Monday but denied any chest pains since then.  Scheduled an appointment for tomorrow.  Did advise patient if chest pains come back he needed to go to the emergency room.

## 2011-08-10 ENCOUNTER — Encounter: Payer: Self-pay | Admitting: Cardiology

## 2011-08-10 ENCOUNTER — Ambulatory Visit (INDEPENDENT_AMBULATORY_CARE_PROVIDER_SITE_OTHER): Payer: PRIVATE HEALTH INSURANCE | Admitting: Cardiology

## 2011-08-10 VITALS — BP 114/67 | HR 68 | Ht 72.0 in | Wt 265.4 lb

## 2011-08-10 DIAGNOSIS — N529 Male erectile dysfunction, unspecified: Secondary | ICD-10-CM

## 2011-08-10 DIAGNOSIS — E669 Obesity, unspecified: Secondary | ICD-10-CM

## 2011-08-10 DIAGNOSIS — E785 Hyperlipidemia, unspecified: Secondary | ICD-10-CM

## 2011-08-10 DIAGNOSIS — I2510 Atherosclerotic heart disease of native coronary artery without angina pectoris: Secondary | ICD-10-CM

## 2011-08-10 DIAGNOSIS — IMO0001 Reserved for inherently not codable concepts without codable children: Secondary | ICD-10-CM

## 2011-08-10 DIAGNOSIS — M791 Myalgia, unspecified site: Secondary | ICD-10-CM

## 2011-08-10 DIAGNOSIS — I10 Essential (primary) hypertension: Secondary | ICD-10-CM

## 2011-08-10 MED ORDER — AMLODIPINE BESYLATE 5 MG PO TABS: 5.0000 mg | ORAL_TABLET | Freq: Every day | ORAL | Status: DC

## 2011-08-10 NOTE — Assessment & Plan Note (Signed)
He has had an elevated CK in the past. Going to check another one of these. He is convinced that it is metoprolol or Cozaar that's causing his muscle aches. He insists on coming off one of them.  I will oblige him discontinue the Cozaar for now. I will start amlodipine 5 mg daily in its place.

## 2011-08-10 NOTE — Patient Instructions (Addendum)
Please stop your Cozaar (Losartan) Start Amlodipine 5 mg daily Continue all other medications as listed  Please have blood work today. (CK)  Follow up in 1 month with Dr Percival Spanish

## 2011-08-10 NOTE — Assessment & Plan Note (Signed)
I'm not at all convinced that he is compliant with any diet instructions.  He understands the importance of losing weight.

## 2011-08-10 NOTE — Progress Notes (Signed)
HPI The patient presents for followup of CAD. He did have an episode of chest pain last Monday. He's never really had chest pain his previous cardiac issues. He's been taking more TUMS recently and does get heartburn.  The discomfort that he did have was under his sternum. There was moderate in intensity. There were no associated symptoms. He's not had any new nausea vomiting or diaphoresis. He's had no new palpitations, presyncope or syncope. He did have a stress perfusion study in February of last year with no evidence of ischemia. His EF on echo was normal. He complains mostly of fatigue and diffuse muscle aches.     No Known Allergies  Current Outpatient Prescriptions  Medication Sig Dispense Refill  . fish oil-omega-3 fatty acids 1000 MG capsule Take 1 g by mouth 2 (two) times daily.       . hydrochlorothiazide (HYDRODIURIL) 25 MG tablet Take 25 mg by mouth daily.      . insulin detemir (LEVEMIR) 100 UNIT/ML injection Inject 40 Units into the skin 2 (two) times daily.      . insulin lispro (HUMALOG KWIKPEN) 100 UNIT/ML injection Inject 20 Units into the skin 3 (three) times daily before meals. UAD       . losartan (COZAAR) 50 MG tablet Take 50 mg by mouth daily.      . metoprolol succinate (TOPROL-XL) 25 MG 24 hr tablet Take 25 mg by mouth daily.       . nitroGLYCERIN (NITROSTAT) 0.4 MG SL tablet Place 1 tablet (0.4 mg total) under the tongue every 5 (five) minutes as needed.  100 tablet  6  . warfarin (COUMADIN) 5 MG tablet Take 1 tablet (5 mg total) by mouth as directed.  60 tablet  1    Past Medical History  Diagnosis Date  . CAD (coronary artery disease)     status post CABG (I do not have the full details of this. He had an occluded saphenous vein graft to OM, PDA after CABG. he had a cypher stent to the right coronary artery. This is apparently a 4-vesel CABG in 2002.    Marland Kitchen HTN (hypertension)     x 15 years  . Dyslipidemia   . Diabetes mellitus   . Peripheral neuropathy   .  Charcot's joint of knee     UNSPECIFIED AREA  . Pulmonary embolism   . Deep venous thrombosis     right lower extremity    Past Surgical History  Procedure Date  . Left knee surgery   . Coronary artery bypass graft     4 time since 2002  . Coronary angioplasty with stent placement     ROS:  Erectile dysfunction.  Otherwise as stated in the HPI and negative for all other systems.   PHYSICAL EXAM BP 114/67  Pulse 68  Ht 6' (1.829 m)  Wt 265 lb 6.4 oz (120.385 kg)  BMI 35.99 kg/m2 GENERAL:  Well appearing HEENT:  Pupils equal round and reactive, fundi not visualized, oral mucosa unremarkable NECK:  No jugular venous distention, waveform within normal limits, carotid upstroke brisk and symmetric, no bruits, no thyromegaly LYMPHATICS:  No cervical, inguinal adenopathy LUNGS:  Clear to auscultation bilaterally BACK:  No CVA tenderness CHEST:  Well healed sternotomy scar. HEART:  PMI not displaced or sustained,S1 and S2 within normal limits, no S3, no S4, no clicks, no rubs, no murmurs ABD:  Flat, positive bowel sounds normal in frequency in pitch, no bruits, no rebound, no  guarding, no midline pulsatile mass, no hepatomegaly, no splenomegaly, obese EXT:  2 plus pulses throughout, right lower leg swelling and mild edema, no cyanosis no clubbing, charcot joints SKIN:  No rashes no nodules, lipomas, callous on ball of his foot NEURO:  Cranial nerves II through XII grossly intact, motor grossly intact throughout PSYCH:  Cognitively intact, oriented to person place and time  EKG:  Sinus rhythm, rate 66, axis within normal limits, intervals limits within normal limits, no acute ST-T wave changes.  08/10/2011   ASSESSMENT AND PLAN

## 2011-08-10 NOTE — Assessment & Plan Note (Signed)
He really wonders whether this could be related to his medications. I think this is likely related to his uncontrolled diabetes.

## 2011-08-10 NOTE — Assessment & Plan Note (Signed)
This will be managed as above.

## 2011-08-10 NOTE — Assessment & Plan Note (Signed)
He is currently not on statin because of his previous elevated CKs.

## 2011-08-10 NOTE — Assessment & Plan Note (Signed)
He had a negative stress perfusion study last year. I'm not convinced and symptoms obtain significantly. At this point no change in hair or further evaluation is warranted. He has pain he needs to go the emergency room or let me know. He needs better risk reduction.

## 2011-08-11 LAB — CK: Total CK: 506 U/L — ABNORMAL HIGH (ref 7–232)

## 2011-08-21 ENCOUNTER — Other Ambulatory Visit: Payer: Self-pay | Admitting: *Deleted

## 2011-08-21 MED ORDER — METOPROLOL SUCCINATE ER 25 MG PO TB24: 25.0000 mg | ORAL_TABLET | Freq: Every day | ORAL | Status: DC

## 2011-08-23 ENCOUNTER — Ambulatory Visit (INDEPENDENT_AMBULATORY_CARE_PROVIDER_SITE_OTHER): Payer: PRIVATE HEALTH INSURANCE

## 2011-08-23 DIAGNOSIS — I82409 Acute embolism and thrombosis of unspecified deep veins of unspecified lower extremity: Secondary | ICD-10-CM

## 2011-08-23 DIAGNOSIS — Z7901 Long term (current) use of anticoagulants: Secondary | ICD-10-CM

## 2011-08-23 LAB — POCT INR: INR: 2.6

## 2011-08-23 MED ORDER — WARFARIN SODIUM 5 MG PO TABS: 5.0000 mg | ORAL_TABLET | ORAL | Status: DC

## 2011-08-31 ENCOUNTER — Ambulatory Visit (INDEPENDENT_AMBULATORY_CARE_PROVIDER_SITE_OTHER): Payer: PRIVATE HEALTH INSURANCE | Admitting: Cardiology

## 2011-08-31 ENCOUNTER — Encounter: Payer: Self-pay | Admitting: Cardiology

## 2011-08-31 VITALS — BP 141/80 | HR 65 | Ht 72.0 in | Wt 268.0 lb

## 2011-08-31 DIAGNOSIS — E785 Hyperlipidemia, unspecified: Secondary | ICD-10-CM

## 2011-08-31 DIAGNOSIS — M791 Myalgia, unspecified site: Secondary | ICD-10-CM

## 2011-08-31 DIAGNOSIS — I10 Essential (primary) hypertension: Secondary | ICD-10-CM

## 2011-08-31 DIAGNOSIS — I2510 Atherosclerotic heart disease of native coronary artery without angina pectoris: Secondary | ICD-10-CM

## 2011-08-31 DIAGNOSIS — IMO0001 Reserved for inherently not codable concepts without codable children: Secondary | ICD-10-CM

## 2011-08-31 NOTE — Patient Instructions (Addendum)
The current medical regimen is effective;  continue present plan and medications.  You have been referred to Rheumatology for muscle pain.  Follow up in 4 months

## 2011-08-31 NOTE — Progress Notes (Signed)
HPI The patient presents for followup of muscle aches. I recently drew CK which was greater than 500. This has been persistently elevated as high as 1500 in the past. At that time he was on a statin. However, his enzymes remain elevated despite no longer been on that drug. His biggest complaint at the last appointment with diffuse chronic muscle aches. He says he wakes up with this pain. He does have a physical job it does not sound like constant exerting physical activity that would explain this. He has chronic dyspnea which I had previously. This is unchanged.    No Known Allergies  Current Outpatient Prescriptions  Medication Sig Dispense Refill  . amLODipine (NORVASC) 5 MG tablet Take 1 tablet (5 mg total) by mouth daily.  30 tablet  11  . fish oil-omega-3 fatty acids 1000 MG capsule Take 1 g by mouth 2 (two) times daily.       . insulin detemir (LEVEMIR) 100 UNIT/ML injection Inject 40 Units into the skin 2 (two) times daily.      . insulin lispro (HUMALOG KWIKPEN) 100 UNIT/ML injection Inject 20 Units into the skin 3 (three) times daily before meals. UAD       . metoprolol succinate (TOPROL-XL) 25 MG 24 hr tablet Take 1 tablet (25 mg total) by mouth daily.  30 tablet  5  . nitroGLYCERIN (NITROSTAT) 0.4 MG SL tablet Place 1 tablet (0.4 mg total) under the tongue every 5 (five) minutes as needed.  100 tablet  6  . warfarin (COUMADIN) 5 MG tablet Take 10 mg by mouth daily.        Past Medical History  Diagnosis Date  . CAD (coronary artery disease)     status post CABG (I do not have the full details of this. He had an occluded saphenous vein graft to OM, PDA after CABG. he had a cypher stent to the right coronary artery. This is apparently a 4-vesel CABG in 2002.    Marland Kitchen HTN (hypertension)     x 15 years  . Dyslipidemia   . Diabetes mellitus   . Peripheral neuropathy   . Charcot's joint of knee     UNSPECIFIED AREA  . Pulmonary embolism   . Deep venous thrombosis     right lower  extremity    Past Surgical History  Procedure Date  . Left knee surgery   . Coronary artery bypass graft     4 time since 2002  . Coronary angioplasty with stent placement     ROS:  Erectile dysfunction.  Otherwise as stated in the HPI and negative for all other systems.   PHYSICAL EXAM BP 141/80  Pulse 65  Ht 6' (1.829 m)  Wt 268 lb (121.564 kg)  BMI 36.35 kg/m2  SpO2 94% GENERAL:  Well appearing HEENT:  Pupils equal round and reactive, fundi not visualized, oral mucosa unremarkable NECK:  No jugular venous distention, waveform within normal limits, carotid upstroke brisk and symmetric, no bruits, no thyromegaly LYMPHATICS:  No cervical, inguinal adenopathy LUNGS:  Clear to auscultation bilaterally BACK:  No CVA tenderness CHEST:  Well healed sternotomy scar. HEART:  PMI not displaced or sustained,S1 and S2 within normal limits, no S3, no S4, no clicks, no rubs, no murmurs ABD:  Flat, positive bowel sounds normal in frequency in pitch, no bruits, no rebound, no guarding, no midline pulsatile mass, no hepatomegaly, no splenomegaly, obese EXT:  2 plus pulses throughout, right lower leg swelling and mild edema,  no cyanosis no clubbing, charcot joints SKIN:  No rashes no nodules, lipomas, callous on ball of his foot NEURO:  Cranial nerves II through XII grossly intact, motor grossly intact throughout PSYCH:  Cognitively intact, oriented to person place and time  EKG:  Sinus rhythm, rate 66, axis within normal limits, intervals limits within normal limits, no acute ST-T wave changes.  08/31/2011   ASSESSMENT AND PLAN  Muscle pain -  He has had an elevated CK in the past.  Please were elevated again buddies not on any medications that might exacerbate this. He describes chronic muscle aching. I don't think he has enough physical activity her doesn't know physical to to explain this. I would like for him to see Dr. Charlestine Night for further evaluation.   CAD -  He had a negative stress  perfusion study last year. At this point he has no new symptoms that would make me think further testing is indicated.   HYPERTENSION -  I did start him on Norvasc stopping Cardizem at the last appointment. He got the Cardizem was causing his muscle aches. He might be slightly better. I will continue with amlodipine I have instructed him to take his blood pressure readings 3 times a day for 2 weeks to send these to [place] that I can make further adjustments as needed.   DYSLIPIDEMIA -  He is currently not on statin because of his previous elevated CKs.   OBESITY, UNSPECIFIED - I'm not at all convinced that he is compliant with any diet instructions. He understands the importance of losing weight.

## 2011-09-28 ENCOUNTER — Ambulatory Visit: Payer: PRIVATE HEALTH INSURANCE | Admitting: Cardiology

## 2011-10-04 ENCOUNTER — Ambulatory Visit (INDEPENDENT_AMBULATORY_CARE_PROVIDER_SITE_OTHER): Payer: PRIVATE HEALTH INSURANCE

## 2011-10-04 DIAGNOSIS — I82409 Acute embolism and thrombosis of unspecified deep veins of unspecified lower extremity: Secondary | ICD-10-CM

## 2011-10-04 DIAGNOSIS — Z7901 Long term (current) use of anticoagulants: Secondary | ICD-10-CM

## 2011-10-04 LAB — POCT INR: INR: 2.7

## 2011-11-07 ENCOUNTER — Telehealth: Payer: Self-pay | Admitting: Cardiology

## 2011-11-07 DIAGNOSIS — I10 Essential (primary) hypertension: Secondary | ICD-10-CM

## 2011-11-07 NOTE — Telephone Encounter (Signed)
Per pt call - states BP has been elevated for the past two to three weeks (160/90 - 150/87) and he stopped amlodipine.  He re-started Losartan 50 mg once a day (he thinks - he can not find the bottle and is out)  He has not taken his BP or BP medications today.  He would like a rx called into his pharmacy for Losartan.  Pt aware this will need to be reviewed with Dr Percival Spanish before an medications can be called in.

## 2011-11-07 NOTE — Telephone Encounter (Signed)
Pt has been having elevated b/p and he stop the new medication and he went back to losartin because it seems to help better and he is out of the old medication and wants to talk about this and get more called in

## 2011-11-09 MED ORDER — LOSARTAN POTASSIUM 50 MG PO TABS: 50.0000 mg | ORAL_TABLET | Freq: Every day | ORAL | Status: DC

## 2011-11-09 NOTE — Telephone Encounter (Signed)
Pt aware RX sent into pharmacy.  He will come in in 1 week for blood work as ordered.

## 2011-11-09 NOTE — Telephone Encounter (Signed)
He insisted on stopping the Cozaar in the spring because he thought that it caused muscle aches.  He thought the same thing about Cardizem.  I don't understand why he stopped the Norvasc.  He can restart the Cozaar at 50 mg daily.  He needs a BMET in one week.  He was to keep a BP diary.  Did he see the rheumatologist?

## 2011-11-16 ENCOUNTER — Other Ambulatory Visit (INDEPENDENT_AMBULATORY_CARE_PROVIDER_SITE_OTHER): Payer: PRIVATE HEALTH INSURANCE

## 2011-11-16 DIAGNOSIS — I10 Essential (primary) hypertension: Secondary | ICD-10-CM

## 2011-11-16 LAB — BASIC METABOLIC PANEL
BUN: 18 mg/dL (ref 6–23)
CO2: 24 mEq/L (ref 19–32)
Calcium: 8.8 mg/dL (ref 8.4–10.5)
Chloride: 99 mEq/L (ref 96–112)
Creatinine, Ser: 0.9 mg/dL (ref 0.4–1.5)
GFR: 90.15 mL/min (ref >60.00)
Glucose, Bld: 399 mg/dL — ABNORMAL HIGH (ref 70–99)
Potassium: 4.3 mEq/L (ref 3.5–5.1)
Sodium: 131 mEq/L — ABNORMAL LOW (ref 135–145)

## 2011-11-22 ENCOUNTER — Ambulatory Visit (INDEPENDENT_AMBULATORY_CARE_PROVIDER_SITE_OTHER): Payer: PRIVATE HEALTH INSURANCE

## 2011-11-22 DIAGNOSIS — I82409 Acute embolism and thrombosis of unspecified deep veins of unspecified lower extremity: Secondary | ICD-10-CM

## 2011-11-22 DIAGNOSIS — Z7901 Long term (current) use of anticoagulants: Secondary | ICD-10-CM

## 2011-11-22 LAB — POCT INR: INR: 3

## 2011-11-24 ENCOUNTER — Telehealth: Payer: Self-pay | Admitting: Cardiology

## 2011-11-24 NOTE — Telephone Encounter (Signed)
Pt rtn call to pam re results [phone]

## 2011-11-24 NOTE — Telephone Encounter (Signed)
Left message for pt to call back schedule appt to repeat BMP on 10/18

## 2011-11-27 NOTE — Telephone Encounter (Signed)
Pt has an appointment 10/25 and will have blood work then

## 2011-11-30 ENCOUNTER — Telehealth: Payer: Self-pay | Admitting: Cardiology

## 2011-11-30 NOTE — Telephone Encounter (Signed)
Spoke with pt who is aware of lab results.  He will repeat 10/25 at appointment time

## 2011-11-30 NOTE — Telephone Encounter (Signed)
New Problem:    Patient returned your call about his lab work.  Please call back.

## 2011-12-08 ENCOUNTER — Encounter: Payer: Self-pay | Admitting: Cardiology

## 2011-12-08 ENCOUNTER — Ambulatory Visit (INDEPENDENT_AMBULATORY_CARE_PROVIDER_SITE_OTHER): Payer: PRIVATE HEALTH INSURANCE | Admitting: Cardiology

## 2011-12-08 VITALS — BP 139/77 | HR 61 | Ht 72.0 in | Wt 268.8 lb

## 2011-12-08 DIAGNOSIS — IMO0001 Reserved for inherently not codable concepts without codable children: Secondary | ICD-10-CM

## 2011-12-08 DIAGNOSIS — E669 Obesity, unspecified: Secondary | ICD-10-CM

## 2011-12-08 DIAGNOSIS — I2510 Atherosclerotic heart disease of native coronary artery without angina pectoris: Secondary | ICD-10-CM

## 2011-12-08 DIAGNOSIS — M791 Myalgia, unspecified site: Secondary | ICD-10-CM

## 2011-12-08 DIAGNOSIS — I10 Essential (primary) hypertension: Secondary | ICD-10-CM

## 2011-12-08 DIAGNOSIS — R0602 Shortness of breath: Secondary | ICD-10-CM

## 2011-12-08 LAB — BASIC METABOLIC PANEL
BUN: 16 mg/dL (ref 6–23)
CO2: 28 mEq/L (ref 19–32)
Calcium: 9 mg/dL (ref 8.4–10.5)
Chloride: 100 mEq/L (ref 96–112)
Creatinine, Ser: 1 mg/dL (ref 0.4–1.5)
GFR: 83.79 mL/min (ref >60.00)
Glucose, Bld: 145 mg/dL — ABNORMAL HIGH (ref 70–99)
Potassium: 3.4 mEq/L — ABNORMAL LOW (ref 3.5–5.1)
Sodium: 136 mEq/L (ref 135–145)

## 2011-12-08 LAB — CK: Total CK: 233 U/L — ABNORMAL HIGH (ref 7–232)

## 2011-12-08 NOTE — Patient Instructions (Addendum)
The current medical regimen is effective;  continue present plan and medications.  Please have blood work today [facility])  Please see a rheumatologist for muscle aches.  Follow up in 6 months with Dr Percival Spanish.  You will receive a letter in the mail 2 months before you are due.  Please call us when you receive this letter to schedule your follow up appointment.

## 2011-12-08 NOTE — Progress Notes (Signed)
HPI The patient presents for followup of muscle aches and CAD. He has had persistently elevated creatinine kinase ranging from 500 500 to as high as 1500 in the past.  At that time he was on a statin. However, his enzymes remain elevated despite no longer been on that drug. His biggest complaint is to be as diffuse chronic muscle aches even out of proportion to his physical job. I did set him up to see a rheumatologist but he apparently canceled this appointment. It sounds like he also hasn't followed with is endocrinologist as he was supposed to. Since I last saw him he has had no new cardiovascular complaints. He denies any new chest pressure, neck or arm discomfort. He has no new palpitations, presyncope or syncope. He has no PND or orthopnea. He just had surgery on his foot for treatment of a callus.   No Known Allergies  Current Outpatient Prescriptions  Medication Sig Dispense Refill  . fish oil-omega-3 fatty acids 1000 MG capsule Take 1 g by mouth 2 (two) times daily.       . insulin detemir (LEVEMIR) 100 UNIT/ML injection Inject 40 Units into the skin 2 (two) times daily.      . insulin lispro (HUMALOG KWIKPEN) 100 UNIT/ML injection Inject 20 Units into the skin 3 (three) times daily before meals. UAD       . losartan (COZAAR) 50 MG tablet Take 1 tablet (50 mg total) by mouth daily.  30 tablet  6  . metoprolol succinate (TOPROL-XL) 25 MG 24 hr tablet Take 1 tablet (25 mg total) by mouth daily.  30 tablet  5  . nitroGLYCERIN (NITROSTAT) 0.4 MG SL tablet Place 1 tablet (0.4 mg total) under the tongue every 5 (five) minutes as needed.  100 tablet  6  . warfarin (COUMADIN) 5 MG tablet Take 10 mg by mouth daily.        Past Medical History  Diagnosis Date  . CAD (coronary artery disease)     status post CABG (I do not have the full details of this. He had an occluded saphenous vein graft to OM, PDA after CABG. he had a cypher stent to the right coronary artery. This is apparently a 4-vesel  CABG in 2002.    Marland Kitchen HTN (hypertension)     x 15 years  . Dyslipidemia   . Diabetes mellitus   . Peripheral neuropathy   . Charcot's joint of knee     UNSPECIFIED AREA  . Pulmonary embolism   . Deep venous thrombosis     right lower extremity    Past Surgical History  Procedure Date  . Left knee surgery   . Coronary artery bypass graft     4 time since 2002  . Coronary angioplasty with stent placement     ROS:  Erectile dysfunction.  Otherwise as stated in the HPI and negative for all other systems.   PHYSICAL EXAM BP 139/77  Pulse 61  Ht 6' (1.829 m)  Wt 268 lb 12.8 oz (121.927 kg)  BMI 36.46 kg/m2 GENERAL:  Well appearing NECK:  No jugular venous distention, waveform within normal limits, carotid upstroke brisk and symmetric, no bruits, no thyromegaly LUNGS:  Clear to auscultation bilaterally BACK:  No CVA tenderness CHEST:  Well healed sternotomy scar. HEART:  PMI not displaced or sustained,S1 and S2 within normal limits, no S3, no S4, no clicks, no rubs, no murmurs ABD:  Flat, positive bowel sounds normal in frequency in pitch, no  bruits, no rebound, no guarding, no midline pulsatile mass, no hepatomegaly, no splenomegaly, obese EXT:  2 plus pulses throughout, right lower leg swelling and mild edema, no cyanosis no clubbing, charcot joints, the right foot is in a boot. The lungs   EKG:  Sinus rhythm, rate 61, axis within normal limits, intervals limits within normal limits, no acute ST-T wave changes.  12/08/2011   ASSESSMENT AND PLAN  Muscle pain -  He has had a persistently elevated CK despite being taken off of statins. He's had continued muscle aches and I did set him up to see a rheumatologist but he didn't go to this appointment. I expressed to him the importance of this and he agrees to comply if we reschedule.  CAD -  He had a negative stress perfusion study last year. At this point he has no new symptoms that would make me think further testing is  indicated.   HYPERTENSION -  The blood pressure is at target. No change in medications is indicated. We will continue with therapeutic lifestyle changes (TLC).  DYSLIPIDEMIA -  He is currently not on statin because of his previous elevated CKs.   OBESITY, UNSPECIFIED - I'm not at all convinced that he is compliant with any diet instructions. He understands the importance of losing weight.  DVT -  He has had massive clot burden in the past with pulmonary embolism. This summer he had some lower extremity thrombus as described previously. Despite the fact that this is not classic or high risk currently I think his overall situation is high risk for recurrent DVT and subsequent embolization. He has absolutely no problems taking the Coumadin. He understands the risk benefits and we have decided to continue with this.

## 2011-12-19 ENCOUNTER — Other Ambulatory Visit: Payer: PRIVATE HEALTH INSURANCE

## 2011-12-19 ENCOUNTER — Other Ambulatory Visit: Payer: Self-pay | Admitting: *Deleted

## 2011-12-19 DIAGNOSIS — E876 Hypokalemia: Secondary | ICD-10-CM

## 2011-12-20 ENCOUNTER — Other Ambulatory Visit (INDEPENDENT_AMBULATORY_CARE_PROVIDER_SITE_OTHER): Payer: PRIVATE HEALTH INSURANCE

## 2011-12-20 DIAGNOSIS — E876 Hypokalemia: Secondary | ICD-10-CM

## 2011-12-20 LAB — BASIC METABOLIC PANEL
BUN: 15 mg/dL (ref 6–23)
CO2: 28 mEq/L (ref 19–32)
Calcium: 9 mg/dL (ref 8.4–10.5)
Chloride: 100 mEq/L (ref 96–112)
Creatinine, Ser: 0.9 mg/dL (ref 0.4–1.5)
GFR: 98.74 mL/min (ref >60.00)
Glucose, Bld: 255 mg/dL — ABNORMAL HIGH (ref 70–99)
Potassium: 4 mEq/L (ref 3.5–5.1)
Sodium: 135 mEq/L (ref 135–145)

## 2011-12-21 ENCOUNTER — Other Ambulatory Visit: Payer: Self-pay | Admitting: *Deleted

## 2011-12-21 MED ORDER — WARFARIN SODIUM 5 MG PO TABS: ORAL_TABLET | ORAL | Status: DC

## 2012-02-20 ENCOUNTER — Other Ambulatory Visit: Payer: Self-pay

## 2012-02-20 MED ORDER — METOPROLOL SUCCINATE ER 25 MG PO TB24: 25.0000 mg | ORAL_TABLET | Freq: Every day | ORAL | Status: DC

## 2012-02-20 NOTE — Telephone Encounter (Signed)
..   Requested Prescriptions   Signed Prescriptions Disp Refills  . metoprolol succinate (TOPROL-XL) 25 MG 24 hr tablet 90 tablet 4    Sig: Take 1 tablet (25 mg total) by mouth daily.    Authorizing Provider: Minus Breeding    Ordering User: Ardis Hughs, Saphire Barnhart Jerilynn Mages

## 2012-04-24 ENCOUNTER — Telehealth: Payer: Self-pay | Admitting: Cardiology

## 2012-04-24 MED ORDER — WARFARIN SODIUM 5 MG PO TABS: ORAL_TABLET | ORAL | Status: DC

## 2012-04-24 NOTE — Telephone Encounter (Signed)
Pt calling re refill of warfarin 5mg , pt out and pharmacy requested mon and tues, pls call asap, uses [facility] [phone]

## 2012-04-24 NOTE — Telephone Encounter (Signed)
Called pt and made appt for him to be seen in [facility] March  19th as had not had his coumadin checked since October 2013 Given enough coumadin to last until seen in clinic on next Wednesday

## 2012-05-01 ENCOUNTER — Ambulatory Visit (INDEPENDENT_AMBULATORY_CARE_PROVIDER_SITE_OTHER): Payer: PRIVATE HEALTH INSURANCE

## 2012-05-01 DIAGNOSIS — Z7901 Long term (current) use of anticoagulants: Secondary | ICD-10-CM

## 2012-05-01 DIAGNOSIS — I82409 Acute embolism and thrombosis of unspecified deep veins of unspecified lower extremity: Secondary | ICD-10-CM

## 2012-05-01 LAB — POCT INR: INR: 2.3

## 2012-05-01 MED ORDER — WARFARIN SODIUM 5 MG PO TABS: ORAL_TABLET | ORAL | Status: DC

## 2012-06-12 ENCOUNTER — Ambulatory Visit (INDEPENDENT_AMBULATORY_CARE_PROVIDER_SITE_OTHER): Payer: PRIVATE HEALTH INSURANCE

## 2012-06-12 DIAGNOSIS — I82409 Acute embolism and thrombosis of unspecified deep veins of unspecified lower extremity: Secondary | ICD-10-CM

## 2012-06-12 DIAGNOSIS — Z7901 Long term (current) use of anticoagulants: Secondary | ICD-10-CM

## 2012-06-12 LAB — POCT INR: INR: 2.9

## 2012-07-03 ENCOUNTER — Other Ambulatory Visit: Payer: Self-pay | Admitting: *Deleted

## 2012-07-03 MED ORDER — WARFARIN SODIUM 5 MG PO TABS: ORAL_TABLET | ORAL | Status: DC

## 2012-07-04 ENCOUNTER — Other Ambulatory Visit: Payer: Self-pay

## 2012-07-04 DIAGNOSIS — I10 Essential (primary) hypertension: Secondary | ICD-10-CM

## 2012-07-04 MED ORDER — LOSARTAN POTASSIUM 50 MG PO TABS: 50.0000 mg | ORAL_TABLET | Freq: Every day | ORAL | Status: DC

## 2012-07-04 NOTE — Telephone Encounter (Signed)
..   Requested Prescriptions   Signed Prescriptions Disp Refills  . losartan (COZAAR) 50 MG tablet 30 tablet 2    Sig: Take 1 tablet (50 mg total) by mouth daily.    Authorizing Provider: Minus Breeding    Ordering User: Ardis Hughs, Sylena Lotter Jerilynn Mages

## 2012-09-11 ENCOUNTER — Telehealth: Payer: Self-pay | Admitting: *Deleted

## 2012-09-11 NOTE — Telephone Encounter (Signed)
LM on house and cell # asking pt to call us concerning his overdue coumadin appt.

## 2012-09-13 ENCOUNTER — Other Ambulatory Visit: Payer: Self-pay | Admitting: *Deleted

## 2012-09-18 ENCOUNTER — Telehealth: Payer: Self-pay | Admitting: Cardiology

## 2012-09-18 ENCOUNTER — Ambulatory Visit (INDEPENDENT_AMBULATORY_CARE_PROVIDER_SITE_OTHER): Payer: PRIVATE HEALTH INSURANCE

## 2012-09-18 DIAGNOSIS — I82409 Acute embolism and thrombosis of unspecified deep veins of unspecified lower extremity: Secondary | ICD-10-CM

## 2012-09-18 DIAGNOSIS — Z7901 Long term (current) use of anticoagulants: Secondary | ICD-10-CM

## 2012-09-18 LAB — POCT INR: INR: 2.5

## 2012-09-18 MED ORDER — WARFARIN SODIUM 5 MG PO TABS: ORAL_TABLET | ORAL | Status: DC

## 2012-09-18 NOTE — Telephone Encounter (Signed)
Spoke with pt.  Explained that Coumadin should not be causing him to feel sick and have diarrhea.  These are issues that should be evaluated by his PCP.  He is overdue for an INR.  He is agreeable to go by the [place] office this afternoon to have INR check.

## 2012-09-18 NOTE — Telephone Encounter (Signed)
New Prob     Pt c/o feeling sick and having diarrhea. Pt believes it may be related to her COUMADIN. Pt would like to speak to a nurse.

## 2012-09-24 ENCOUNTER — Ambulatory Visit: Payer: Self-pay | Admitting: Internal Medicine

## 2012-09-26 ENCOUNTER — Telehealth: Payer: Self-pay | Admitting: Cardiology

## 2012-09-26 NOTE — Telephone Encounter (Signed)
Will forward to MD for review and orders

## 2012-09-26 NOTE — Telephone Encounter (Signed)
New problem   Amy/[name] need permission to stop coumadin for 5 day before colonoscopy. Please fax to [phone]

## 2012-10-02 ENCOUNTER — Other Ambulatory Visit: Payer: Self-pay

## 2012-10-02 DIAGNOSIS — I10 Essential (primary) hypertension: Secondary | ICD-10-CM

## 2012-10-02 MED ORDER — LOSARTAN POTASSIUM 50 MG PO TABS: 50.0000 mg | ORAL_TABLET | Freq: Every day | ORAL | Status: DC

## 2012-10-02 NOTE — Telephone Encounter (Signed)
Given his significant problems with recurrent clotting in the past he needs to be bridged with Lovenox.

## 2012-10-03 NOTE — Telephone Encounter (Signed)
Information faxed to Amy

## 2012-10-07 ENCOUNTER — Telehealth: Payer: Self-pay | Admitting: Cardiology

## 2012-10-07 NOTE — Telephone Encounter (Signed)
Spoke with Bobby Lindsey (Amy away from desk). She is aware I am going to refax order to [name].

## 2012-10-07 NOTE — Telephone Encounter (Signed)
Given his significant problems with recurrent clotting in the past he needs to be bridged with Lovenox, per dr Percival Spanish, dr rein's office needs faxed order [phone]

## 2012-10-16 ENCOUNTER — Telehealth: Payer: Self-pay | Admitting: Cardiology

## 2012-10-16 NOTE — Telephone Encounter (Signed)
Attempted to call pt to schedule appt today in [facility] in [place] for Lovenox bridging.  LMOM TCB to schedule appt for today.

## 2012-10-16 NOTE — Telephone Encounter (Signed)
Spoke with Amy who states they wanted our coumadin clinic to bridge pt - that was not communicated thru the telephone conversations/messages available.  Amy aware I will forward info to [facility].  Pt will need to be bridged starting tomorrow in order for him to have his scheduled procedure.

## 2012-10-16 NOTE — Telephone Encounter (Signed)
New problem   Amy/[name] stated she still haven't gotten fax for pt to have Lovenox Bridge. Please call Amy asap per Amy.

## 2012-10-17 ENCOUNTER — Telehealth: Payer: Self-pay | Admitting: Cardiology

## 2012-10-17 ENCOUNTER — Ambulatory Visit (INDEPENDENT_AMBULATORY_CARE_PROVIDER_SITE_OTHER): Payer: PRIVATE HEALTH INSURANCE | Admitting: *Deleted

## 2012-10-17 DIAGNOSIS — Z7901 Long term (current) use of anticoagulants: Secondary | ICD-10-CM

## 2012-10-17 DIAGNOSIS — I82409 Acute embolism and thrombosis of unspecified deep veins of unspecified lower extremity: Secondary | ICD-10-CM

## 2012-10-17 LAB — POCT INR: INR: 2

## 2012-10-17 NOTE — Patient Instructions (Addendum)
10/16/2012 last day to take coumadin  10/17/2012 no coumadin no lovenox 10/19/2011 no coumadin Lovenox 120mg  8am and lovenox 120mg  8pm 10/20/2011 no coumadin Lovenox 120 mg 8am and Lovenox 120mg  8pm 10/20/2012 no coumadin Lovenox 120mg  8am and lovenox 120mg  8pm 10/22/2011 no coumadin Lovenox 120mg  8am only  10/22/2012 no coumadin no Lovenox day of procedure  When instructed by MD doing procedure restart coumadin and Lovenox at same dose and take extra 1/2 tablet of coumadin for 2 days and continue coumadin and Lovenox 120mg  at 8am and Lovenox 120mg  at 8pm until seen in clinic on Friday 10/25/2012

## 2012-10-17 NOTE — Telephone Encounter (Signed)
Returned call to Mickel Baas at [employer].  [phone] [employer] call for prior auth for Lovenox rx.   Called [employer] for prior auth. Spoke with [employer] prior auth from.  She states cannot do prior auth over the phone must fill out form and fax back.  Made them aware pt is to start med tomorrow am, they are going to mark as expedite.  Completed form and faxed back.  Called Mickel Baas at [facility] and made her aware will check on status in am.

## 2012-10-17 NOTE — Telephone Encounter (Signed)
Appointment scheduled for today at 3:15 for Lovenox bridging.  Pt aware of appt date and time.

## 2012-10-17 NOTE — Telephone Encounter (Signed)
[facility] states prior authorization for LOVENOX before they can supply to pt.

## 2012-10-18 NOTE — Telephone Encounter (Signed)
Called [employer] Enoxaparin has been approved x 1 #8 syringes x 5 days.  Per pharmacy inquiry paid claim processed on 10/17/12.

## 2012-10-22 ENCOUNTER — Ambulatory Visit: Payer: Self-pay | Admitting: Gastroenterology

## 2012-10-23 LAB — PATHOLOGY REPORT

## 2012-10-25 ENCOUNTER — Telehealth: Payer: Self-pay | Admitting: Pharmacist

## 2012-10-25 NOTE — Telephone Encounter (Signed)
New Problem  Pt states that he recently  had a colonoscopy Tuesday and wants to know if he should continue his coumadin/// needs confirmation

## 2012-10-25 NOTE — Telephone Encounter (Signed)
Telephoned pt to find out why he didn't come in for his INR check today post colonoscopy. Pt states he did restart his coumadin as instructed post procedure, he states that he didn't have any restrictions just didn't follow post op coumadin and Lovenox instructions. Thus, instructed pt to restart coumadin today taking 12.5mg s for 2 days then resume 10mg s QD and restart Lovenox 120mg s SQ Q12hrs. F/u appt s/c for Monday.

## 2012-10-28 ENCOUNTER — Telehealth: Payer: Self-pay | Admitting: *Deleted

## 2012-10-28 ENCOUNTER — Encounter: Payer: Self-pay | Admitting: *Deleted

## 2012-10-28 NOTE — Telephone Encounter (Signed)
This encounter was created in error - please disregard.

## 2012-10-28 NOTE — Telephone Encounter (Signed)
[employer] RX approval for enoxaparin 120 mg SQ, approval for 1 time, ticket [id number]

## 2012-10-31 ENCOUNTER — Ambulatory Visit (INDEPENDENT_AMBULATORY_CARE_PROVIDER_SITE_OTHER): Payer: PRIVATE HEALTH INSURANCE | Admitting: Pharmacist

## 2012-10-31 DIAGNOSIS — I82409 Acute embolism and thrombosis of unspecified deep veins of unspecified lower extremity: Secondary | ICD-10-CM

## 2012-10-31 DIAGNOSIS — Z7901 Long term (current) use of anticoagulants: Secondary | ICD-10-CM

## 2012-10-31 LAB — POCT INR: INR: 1.4

## 2012-11-06 ENCOUNTER — Other Ambulatory Visit: Payer: Self-pay

## 2012-11-06 DIAGNOSIS — I10 Essential (primary) hypertension: Secondary | ICD-10-CM

## 2012-11-06 MED ORDER — LOSARTAN POTASSIUM 50 MG PO TABS: 50.0000 mg | ORAL_TABLET | Freq: Every day | ORAL | Status: DC

## 2012-11-27 ENCOUNTER — Ambulatory Visit (INDEPENDENT_AMBULATORY_CARE_PROVIDER_SITE_OTHER): Payer: PRIVATE HEALTH INSURANCE | Admitting: General Practice

## 2012-11-27 DIAGNOSIS — Z7901 Long term (current) use of anticoagulants: Secondary | ICD-10-CM

## 2012-11-27 DIAGNOSIS — I82409 Acute embolism and thrombosis of unspecified deep veins of unspecified lower extremity: Secondary | ICD-10-CM

## 2012-11-27 LAB — POCT INR: INR: 2.5

## 2012-12-10 ENCOUNTER — Other Ambulatory Visit: Payer: Self-pay | Admitting: *Deleted

## 2012-12-10 DIAGNOSIS — I10 Essential (primary) hypertension: Secondary | ICD-10-CM

## 2012-12-10 MED ORDER — LOSARTAN POTASSIUM 50 MG PO TABS: 50.0000 mg | ORAL_TABLET | Freq: Every day | ORAL | Status: DC

## 2012-12-24 ENCOUNTER — Other Ambulatory Visit: Payer: Self-pay | Admitting: *Deleted

## 2012-12-24 MED ORDER — WARFARIN SODIUM 5 MG PO TABS: ORAL_TABLET | ORAL | Status: DC

## 2012-12-25 ENCOUNTER — Ambulatory Visit (INDEPENDENT_AMBULATORY_CARE_PROVIDER_SITE_OTHER): Payer: PRIVATE HEALTH INSURANCE | Admitting: *Deleted

## 2012-12-25 DIAGNOSIS — I82409 Acute embolism and thrombosis of unspecified deep veins of unspecified lower extremity: Secondary | ICD-10-CM

## 2012-12-25 DIAGNOSIS — Z7901 Long term (current) use of anticoagulants: Secondary | ICD-10-CM

## 2012-12-25 LAB — POCT INR: INR: 1.2

## 2013-02-04 ENCOUNTER — Other Ambulatory Visit: Payer: Self-pay

## 2013-02-04 DIAGNOSIS — I10 Essential (primary) hypertension: Secondary | ICD-10-CM

## 2013-02-04 MED ORDER — LOSARTAN POTASSIUM 50 MG PO TABS: 50.0000 mg | ORAL_TABLET | Freq: Every day | ORAL | Status: DC

## 2013-02-12 ENCOUNTER — Ambulatory Visit (INDEPENDENT_AMBULATORY_CARE_PROVIDER_SITE_OTHER): Payer: PRIVATE HEALTH INSURANCE

## 2013-02-12 DIAGNOSIS — I82409 Acute embolism and thrombosis of unspecified deep veins of unspecified lower extremity: Secondary | ICD-10-CM

## 2013-02-12 DIAGNOSIS — Z7901 Long term (current) use of anticoagulants: Secondary | ICD-10-CM

## 2013-02-12 LAB — POCT INR: INR: 2.1

## 2013-02-14 NOTE — Telephone Encounter (Signed)
No other info °

## 2013-02-27 ENCOUNTER — Other Ambulatory Visit: Payer: Self-pay

## 2013-02-27 MED ORDER — METOPROLOL SUCCINATE ER 25 MG PO TB24
25.0000 mg | ORAL_TABLET | Freq: Every day | ORAL | Status: DC
Start: 2013-02-27 — End: 2013-03-03

## 2013-03-03 ENCOUNTER — Ambulatory Visit (INDEPENDENT_AMBULATORY_CARE_PROVIDER_SITE_OTHER): Payer: PRIVATE HEALTH INSURANCE | Admitting: Cardiology

## 2013-03-03 ENCOUNTER — Encounter: Payer: Self-pay | Admitting: Cardiology

## 2013-03-03 ENCOUNTER — Telehealth: Payer: Self-pay | Admitting: Cardiology

## 2013-03-03 ENCOUNTER — Ambulatory Visit: Payer: Self-pay | Admitting: Internal Medicine

## 2013-03-03 VITALS — BP 132/84 | HR 76 | Ht 72.0 in | Wt 266.0 lb

## 2013-03-03 DIAGNOSIS — I10 Essential (primary) hypertension: Secondary | ICD-10-CM

## 2013-03-03 DIAGNOSIS — I2510 Atherosclerotic heart disease of native coronary artery without angina pectoris: Secondary | ICD-10-CM

## 2013-03-03 MED ORDER — RIVAROXABAN 20 MG PO TABS: 20.0000 mg | ORAL_TABLET | Freq: Every day | ORAL | Status: DC

## 2013-03-03 MED ORDER — LOSARTAN POTASSIUM 50 MG PO TABS: 50.0000 mg | ORAL_TABLET | Freq: Every day | ORAL | Status: DC

## 2013-03-03 MED ORDER — NITROGLYCERIN 0.4 MG SL SUBL
0.4000 mg | SUBLINGUAL_TABLET | SUBLINGUAL | Status: DC | PRN
Start: 2013-03-03 — End: 2014-10-28

## 2013-03-03 MED ORDER — METOPROLOL SUCCINATE ER 25 MG PO TB24: 25.0000 mg | ORAL_TABLET | Freq: Every day | ORAL | Status: DC

## 2013-03-03 NOTE — Progress Notes (Signed)
HPI The patient presents for followup of muscle aches and CAD.  He was added to my schedule today.  He said that last night he developed some spots pop up on his right calf. He has chronic venous stasis and chronic swelling in that leg but this was new. He has some chronic erythema but he thinks it's worse. He is not describing any new fevers or chills. He's not describing any new chest pressure, neck or arm discomfort. He has no shortness of breath, PND or orthopnea. He continues to have his diffuse muscle aches which he has had some time already. He has had persistently elevated CKs but has refused rheumatology followup.   No Known Allergies  Current Outpatient Prescriptions  Medication Sig Dispense Refill  . insulin glargine (LANTUS) 100 UNIT/ML injection Inject into the skin at bedtime. 40 units      . insulin lispro (HUMALOG KWIKPEN) 100 UNIT/ML injection Inject 20 Units into the skin 3 (three) times daily before meals. UAD       . losartan (COZAAR) 50 MG tablet Take 1 tablet (50 mg total) by mouth daily.  15 tablet  0  . nitroGLYCERIN (NITROSTAT) 0.4 MG SL tablet Place 1 tablet (0.4 mg total) under the tongue every 5 (five) minutes as needed.  100 tablet  6  . warfarin (COUMADIN) 5 MG tablet Take as directed by coumadin clinic  65 tablet  1  . metoprolol succinate (TOPROL-XL) 25 MG 24 hr tablet Take 1 tablet (25 mg total) by mouth daily.  15 tablet  0   No current facility-administered medications for this visit.    Past Medical History  Diagnosis Date  . CAD (coronary artery disease)     status post CABG (I do not have the full details of this. He had an occluded saphenous vein graft to OM, PDA after CABG. he had a cypher stent to the right coronary artery. This is apparently a 4-vesel CABG in 2002.    Marland Kitchen HTN (hypertension)     x 15 years  . Dyslipidemia   . Diabetes mellitus   . Peripheral neuropathy   . Charcot's joint of knee     UNSPECIFIED AREA  . Pulmonary embolism   .  Deep venous thrombosis     right lower extremity    Past Surgical History  Procedure Laterality Date  . Left knee surgery    . Coronary artery bypass graft      4 time since 2002  . Coronary angioplasty with stent placement      ROS:  Erectile dysfunction.  Otherwise as stated in the HPI and negative for all other systems.   PHYSICAL EXAM BP 132/84  Pulse 76  Ht 6' (1.829 m)  Wt 266 lb (120.657 kg)  BMI 36.07 kg/m2 GENERAL:  Well appearing NECK:  No jugular venous distention, waveform within normal limits, carotid upstroke brisk and symmetric, no bruits, no thyromegaly LUNGS:  Clear to auscultation bilaterally BACK:  No CVA tenderness CHEST:  Well healed sternotomy scar. HEART:  PMI not displaced or sustained,S1 and S2 within normal limits, no S3, no S4, no clicks, no rubs, no murmurs ABD:  Flat, positive bowel sounds normal in frequency in pitch, no bruits, no rebound, no guarding, no midline pulsatile mass, no hepatomegaly, no splenomegaly, obese EXT:  2 plus pulses throughout, right lower leg swelling with 2-3 small pockets of fluctuance and increasing erythema, no cyanosis no clubbing, charcot joints  EKG:  Sinus rhythm, rate 76,  axis within normal limits, intervals limits within normal limits, no acute ST-T wave changes.  03/03/2013   ASSESSMENT AND PLAN  Muscle pain -  He still has these. He's had persistently elevated CKs and is not on a statin. I have tried to send him to a rheumatologist and he has failed to comply with this suggestion and I made the suggestion again.  CAD -  He had a negative stress perfusion study in 2013. At this point he has no new symptoms that would make me think further testing is indicated.   HYPERTENSION -  The blood pressure is at target. No change in medications is indicated. We will continue with therapeutic lifestyle changes (TLC).  DYSLIPIDEMIA -  He is currently not on statin because of his previous elevated CKs.   DVT -  He has  had massive clot burden in the past with pulmonary embolism. This summer he had some lower extremity thrombus as described previously.  I have elected to continue him on anticoagulation because of recurrent thrombus. However, he wants to switch from warfarin to one of the newer agents and so we will start [place] - He does have some erythema and some pockets of fluctuance. I spoke with Ramonita Lab today.  I would like him to be seen to be considered for possible cellulitis. They have graciously agreed to see him in the office today.

## 2013-03-03 NOTE — Telephone Encounter (Signed)
Called stating his (R) leg is twice the size of his (L) leg.  Thinks he has a blood clot.  States he has had this before.  States there is a red spot on back of calf and in front. Is very tight.  Has been swollen and red since last Thursday but has gotten worse.  States he is very nervous and having chills.  Temp last PM was 99.8. Also states he needs refill on Losartan.  Dr. Percival Spanish gave him a few  recently but was told to make an appointment before refilled again. Spoke w/Pam Raul Del, RN-Dr. Hochrein's nurse who stated they had a cancellation at 11:15 and could come then.  Pt advised and will be here at 11:15.

## 2013-03-03 NOTE — Telephone Encounter (Signed)
New message   Patient calling need to be seen today     Since Friday C/O swollen in  Right leg. Red spot on front . Patient think he might have a blood clot. A little sob. Nervous. Cold chills.

## 2013-03-03 NOTE — Patient Instructions (Signed)
Please stop Coumadin. Start Xarelto 20 mg a day. Continue all other medications as listed.  Please see Dr Caryl Comes at 2:30 pm today.  Follow up with Dr Percival Spanish in 2 months.

## 2013-03-06 ENCOUNTER — Other Ambulatory Visit: Payer: Self-pay

## 2013-03-11 LAB — CULTURE, BLOOD (SINGLE)

## 2013-04-05 ENCOUNTER — Inpatient Hospital Stay (HOSPITAL_COMMUNITY)
Admission: EM | Admit: 2013-04-05 | Discharge: 2013-04-11 | DRG: 246 | Disposition: A | Discharge status: Home / Self Care | Payer: PRIVATE HEALTH INSURANCE | Attending: Cardiology | Admitting: Cardiology

## 2013-04-05 ENCOUNTER — Encounter (HOSPITAL_COMMUNITY): Payer: Self-pay | Admitting: Emergency Medicine

## 2013-04-05 ENCOUNTER — Emergency Department (HOSPITAL_COMMUNITY): Payer: PRIVATE HEALTH INSURANCE

## 2013-04-05 ENCOUNTER — Encounter (HOSPITAL_COMMUNITY)
Admission: EM | Disposition: A | Discharge status: Home / Self Care | Payer: PRIVATE HEALTH INSURANCE | Source: Home / Self Care | Attending: Cardiology

## 2013-04-05 DIAGNOSIS — I25810 Atherosclerosis of coronary artery bypass graft(s) without angina pectoris: Secondary | ICD-10-CM | POA: Diagnosis present

## 2013-04-05 DIAGNOSIS — I498 Other specified cardiac arrhythmias: Secondary | ICD-10-CM | POA: Diagnosis not present

## 2013-04-05 DIAGNOSIS — E785 Hyperlipidemia, unspecified: Secondary | ICD-10-CM | POA: Diagnosis present

## 2013-04-05 DIAGNOSIS — I4891 Unspecified atrial fibrillation: Secondary | ICD-10-CM | POA: Diagnosis present

## 2013-04-05 DIAGNOSIS — I82409 Acute embolism and thrombosis of unspecified deep veins of unspecified lower extremity: Secondary | ICD-10-CM | POA: Diagnosis present

## 2013-04-05 DIAGNOSIS — Z6835 Body mass index (BMI) 35.0-35.9, adult: Secondary | ICD-10-CM

## 2013-04-05 DIAGNOSIS — E1165 Type 2 diabetes mellitus with hyperglycemia: Secondary | ICD-10-CM

## 2013-04-05 DIAGNOSIS — Z6834 Body mass index (BMI) 34.0-34.9, adult: Secondary | ICD-10-CM

## 2013-04-05 DIAGNOSIS — E66811 Obesity, class 1: Secondary | ICD-10-CM

## 2013-04-05 DIAGNOSIS — M1460 Charcot's joint, unspecified site: Secondary | ICD-10-CM | POA: Diagnosis present

## 2013-04-05 DIAGNOSIS — G609 Hereditary and idiopathic neuropathy, unspecified: Secondary | ICD-10-CM | POA: Diagnosis present

## 2013-04-05 DIAGNOSIS — Z86711 Personal history of pulmonary embolism: Secondary | ICD-10-CM

## 2013-04-05 DIAGNOSIS — I2119 ST elevation (STEMI) myocardial infarction involving other coronary artery of inferior wall: Secondary | ICD-10-CM

## 2013-04-05 DIAGNOSIS — Z794 Long term (current) use of insulin: Secondary | ICD-10-CM

## 2013-04-05 DIAGNOSIS — J449 Chronic obstructive pulmonary disease, unspecified: Secondary | ICD-10-CM | POA: Diagnosis present

## 2013-04-05 DIAGNOSIS — E669 Obesity, unspecified: Secondary | ICD-10-CM | POA: Diagnosis present

## 2013-04-05 DIAGNOSIS — I480 Paroxysmal atrial fibrillation: Secondary | ICD-10-CM | POA: Diagnosis present

## 2013-04-05 DIAGNOSIS — Z7901 Long term (current) use of anticoagulants: Secondary | ICD-10-CM

## 2013-04-05 DIAGNOSIS — Z86718 Personal history of other venous thrombosis and embolism: Secondary | ICD-10-CM

## 2013-04-05 DIAGNOSIS — R748 Abnormal levels of other serum enzymes: Secondary | ICD-10-CM

## 2013-04-05 DIAGNOSIS — IMO0001 Reserved for inherently not codable concepts without codable children: Secondary | ICD-10-CM | POA: Diagnosis present

## 2013-04-05 DIAGNOSIS — I4430 Unspecified atrioventricular block: Secondary | ICD-10-CM | POA: Diagnosis present

## 2013-04-05 DIAGNOSIS — G629 Polyneuropathy, unspecified: Secondary | ICD-10-CM | POA: Diagnosis present

## 2013-04-05 DIAGNOSIS — I5033 Acute on chronic diastolic (congestive) heart failure: Secondary | ICD-10-CM | POA: Diagnosis present

## 2013-04-05 DIAGNOSIS — I2510 Atherosclerotic heart disease of native coronary artery without angina pectoris: Secondary | ICD-10-CM | POA: Diagnosis present

## 2013-04-05 DIAGNOSIS — E119 Type 2 diabetes mellitus without complications: Secondary | ICD-10-CM | POA: Diagnosis present

## 2013-04-05 DIAGNOSIS — J4489 Other specified chronic obstructive pulmonary disease: Secondary | ICD-10-CM | POA: Diagnosis present

## 2013-04-05 DIAGNOSIS — R001 Bradycardia, unspecified: Secondary | ICD-10-CM

## 2013-04-05 DIAGNOSIS — G988 Other disorders of nervous system: Secondary | ICD-10-CM | POA: Diagnosis present

## 2013-04-05 DIAGNOSIS — I2589 Other forms of chronic ischemic heart disease: Secondary | ICD-10-CM | POA: Diagnosis present

## 2013-04-05 DIAGNOSIS — I509 Heart failure, unspecified: Secondary | ICD-10-CM | POA: Diagnosis present

## 2013-04-05 DIAGNOSIS — E6609 Other obesity due to excess calories: Secondary | ICD-10-CM

## 2013-04-05 DIAGNOSIS — I119 Hypertensive heart disease without heart failure: Secondary | ICD-10-CM | POA: Diagnosis present

## 2013-04-05 DIAGNOSIS — I4892 Unspecified atrial flutter: Secondary | ICD-10-CM | POA: Diagnosis present

## 2013-04-05 DIAGNOSIS — I5023 Acute on chronic systolic (congestive) heart failure: Secondary | ICD-10-CM | POA: Diagnosis present

## 2013-04-05 DIAGNOSIS — H3500 Unspecified background retinopathy: Secondary | ICD-10-CM | POA: Diagnosis present

## 2013-04-05 DIAGNOSIS — I252 Old myocardial infarction: Secondary | ICD-10-CM

## 2013-04-05 DIAGNOSIS — I10 Essential (primary) hypertension: Secondary | ICD-10-CM | POA: Diagnosis present

## 2013-04-05 DIAGNOSIS — Z79899 Other long term (current) drug therapy: Secondary | ICD-10-CM

## 2013-04-05 DIAGNOSIS — G4730 Sleep apnea, unspecified: Secondary | ICD-10-CM

## 2013-04-05 HISTORY — PX: LEFT HEART CATHETERIZATION WITH CORONARY/GRAFT ANGIOGRAM: SHX5450

## 2013-04-05 HISTORY — DX: Angina pectoris, unspecified: I20.9

## 2013-04-05 HISTORY — DX: Gastro-esophageal reflux disease without esophagitis: K21.9

## 2013-04-05 HISTORY — DX: Bradycardia, unspecified: R00.1

## 2013-04-05 HISTORY — DX: Obesity, unspecified: E66.9

## 2013-04-05 HISTORY — DX: Acute myocardial infarction, unspecified: I21.9

## 2013-04-05 HISTORY — PX: PERCUTANEOUS CORONARY STENT INTERVENTION (PCI-S): SHX5485

## 2013-04-05 HISTORY — DX: Acute embolism and thrombosis of unspecified deep veins of unspecified lower extremity: I82.409

## 2013-04-05 HISTORY — DX: Sleep apnea, unspecified: G47.30

## 2013-04-05 HISTORY — DX: Abnormal levels of other serum enzymes: R74.8

## 2013-04-05 HISTORY — DX: Chronic obstructive pulmonary disease, unspecified: J44.9

## 2013-04-05 HISTORY — DX: Unspecified atrial fibrillation: I48.91

## 2013-04-05 HISTORY — DX: Unspecified diastolic (congestive) heart failure: I50.30

## 2013-04-05 LAB — GLUCOSE, CAPILLARY
Glucose-Capillary: 342 mg/dL — ABNORMAL HIGH (ref 70–99)
Glucose-Capillary: 292 mg/dL — ABNORMAL HIGH (ref 70–99)

## 2013-04-05 LAB — I-STAT TROPONIN, ED: Troponin i, poc: 4.54 ng/mL (ref 0.00–0.08)

## 2013-04-05 LAB — I-STAT CHEM 8, ED
BUN: 19 mg/dL (ref 6–23)
Calcium, Ion: 1.21 mmol/L (ref 1.12–1.23)
Chloride: 97 mEq/L (ref 96–112)
Creatinine, Ser: 1.2 mg/dL (ref 0.50–1.35)
Glucose, Bld: 343 mg/dL — ABNORMAL HIGH (ref 70–99)
HCT: 47 % (ref 39.0–52.0)
Hemoglobin: 16 g/dL (ref 13.0–17.0)
Potassium: 4.6 mEq/L (ref 3.7–5.3)
Sodium: 135 mEq/L — ABNORMAL LOW (ref 137–147)
TCO2: 24 mmol/L (ref 0–100)

## 2013-04-05 LAB — BASIC METABOLIC PANEL
BUN: 19 mg/dL (ref 6–23)
CO2: 25 mEq/L (ref 19–32)
Calcium: 9 mg/dL (ref 8.4–10.5)
Chloride: 98 mEq/L (ref 96–112)
Creatinine, Ser: 0.99 mg/dL (ref 0.50–1.35)
GFR calc Af Amer: >90 mL/min (ref >90)
GFR calc non Af Amer: 88 mL/min — ABNORMAL LOW (ref >90)
Glucose, Bld: 338 mg/dL — ABNORMAL HIGH (ref 70–99)
Potassium: 4.8 mEq/L (ref 3.7–5.3)
Sodium: 134 mEq/L — ABNORMAL LOW (ref 137–147)

## 2013-04-05 LAB — TROPONIN I
Troponin I: >20 ng/mL (ref <0.30)
Troponin I: 12.82 ng/mL (ref <0.30)

## 2013-04-05 LAB — DIFFERENTIAL
Basophils Absolute: 0 10*3/uL (ref 0.0–0.1)
Basophils Relative: 0 % (ref 0–1)
Eosinophils Absolute: 0.1 10*3/uL (ref 0.0–0.7)
Eosinophils Relative: 1 % (ref 0–5)
Lymphocytes Relative: 17 % (ref 12–46)
Lymphs Abs: 2.3 10*3/uL (ref 0.7–4.0)
Monocytes Absolute: 1.6 10*3/uL — ABNORMAL HIGH (ref 0.1–1.0)
Monocytes Relative: 12 % (ref 3–12)
Neutro Abs: 9.6 10*3/uL — ABNORMAL HIGH (ref 1.7–7.7)
Neutrophils Relative %: 71 % (ref 43–77)

## 2013-04-05 LAB — CBC
HCT: 42.7 % (ref 39.0–52.0)
Hemoglobin: 15 g/dL (ref 13.0–17.0)
MCH: 29.9 pg (ref 26.0–34.0)
MCHC: 35.1 g/dL (ref 30.0–36.0)
MCV: 85.1 fL (ref 78.0–100.0)
Platelets: 153 10*3/uL (ref 150–400)
RBC: 5.02 MIL/uL (ref 4.22–5.81)
RDW: 13.5 % (ref 11.5–15.5)
WBC: 13.7 10*3/uL — ABNORMAL HIGH (ref 4.0–10.5)

## 2013-04-05 LAB — MRSA PCR SCREENING: MRSA by PCR: NEGATIVE

## 2013-04-05 LAB — PROTIME-INR
INR: 1.26 (ref 0.00–1.49)
Prothrombin Time: 15.5 seconds — ABNORMAL HIGH (ref 11.6–15.2)

## 2013-04-05 LAB — PLATELET COUNT: Platelets: 160 10*3/uL (ref 150–400)

## 2013-04-05 LAB — APTT: aPTT: 28 seconds (ref 24–37)

## 2013-04-05 SURGERY — PERCUTANEOUS CORONARY STENT INTERVENTION (PCI-S)

## 2013-04-05 MED ORDER — SODIUM CHLORIDE 0.9 % IV SOLN
0.2500 mg/kg/h | INTRAVENOUS | Status: DC
  Administered 2013-04-05: 0.25 mg/kg/h via INTRAVENOUS
  Filled 2013-04-05 (×2): qty 250

## 2013-04-05 MED ORDER — MIDAZOLAM HCL 2 MG/2ML IJ SOLN
INTRAMUSCULAR | Status: AC
  Filled 2013-04-05: qty 2

## 2013-04-05 MED ORDER — CLOPIDOGREL BISULFATE 300 MG PO TABS
ORAL_TABLET | ORAL | Status: AC
  Filled 2013-04-05: qty 1

## 2013-04-05 MED ORDER — TIROFIBAN (AGGRASTAT) BOLUS VIA INFUSION
25.0000 ug/kg | Freq: Once | INTRAVENOUS | Status: AC
  Administered 2013-04-05: 3005 ug via INTRAVENOUS
  Filled 2013-04-05: qty 61

## 2013-04-05 MED ORDER — ONDANSETRON HCL 4 MG/2ML IJ SOLN: 4.0000 mg | Freq: Four times a day (QID) | INTRAMUSCULAR | Status: DC | PRN

## 2013-04-05 MED ORDER — METOPROLOL SUCCINATE ER 25 MG PO TB24: 25.0000 mg | ORAL_TABLET | Freq: Every day | ORAL | Status: DC

## 2013-04-05 MED ORDER — CLOPIDOGREL BISULFATE 75 MG PO TABS
75.0000 mg | ORAL_TABLET | Freq: Every day | ORAL | Status: DC
  Administered 2013-04-06 – 2013-04-11 (×6): 75 mg via ORAL
  Filled 2013-04-05 (×8): qty 1

## 2013-04-05 MED ORDER — SODIUM CHLORIDE 0.9 % IV SOLN: INTRAVENOUS | Status: DC

## 2013-04-05 MED ORDER — ACETAMINOPHEN 325 MG PO TABS: 650.0000 mg | ORAL_TABLET | ORAL | Status: DC | PRN

## 2013-04-05 MED ORDER — HEPARIN SODIUM (PORCINE) 5000 UNIT/ML IJ SOLN
4000.0000 [IU] | Freq: Once | INTRAMUSCULAR | Status: AC
Start: 2013-04-05 — End: 2013-04-05
  Administered 2013-04-05: 4000 [IU] via INTRAVENOUS
  Filled 2013-04-05 (×2): qty 1

## 2013-04-05 MED ORDER — MORPHINE SULFATE 2 MG/ML IJ SOLN: 2.0000 mg | INTRAMUSCULAR | Status: DC | PRN

## 2013-04-05 MED ORDER — ASPIRIN 81 MG PO CHEW
81.0000 mg | CHEWABLE_TABLET | Freq: Every day | ORAL | Status: DC
  Administered 2013-04-06 – 2013-04-11 (×6): 81 mg via ORAL
  Filled 2013-04-05 (×6): qty 1

## 2013-04-05 MED ORDER — HEPARIN SODIUM (PORCINE) 5000 UNIT/ML IJ SOLN: 60.0000 [IU]/kg | Freq: Once | INTRAMUSCULAR | Status: DC

## 2013-04-05 MED ORDER — LOSARTAN POTASSIUM 50 MG PO TABS
50.0000 mg | ORAL_TABLET | Freq: Every day | ORAL | Status: DC
  Administered 2013-04-06 – 2013-04-09 (×4): 50 mg via ORAL
  Filled 2013-04-05 (×4): qty 1

## 2013-04-05 MED ORDER — ONDANSETRON HCL 4 MG/2ML IJ SOLN
INTRAMUSCULAR | Status: AC
  Administered 2013-04-05: 4 mg
  Filled 2013-04-05: qty 2

## 2013-04-05 MED ORDER — HEPARIN (PORCINE) IN NACL 100-0.45 UNIT/ML-% IJ SOLN
2750.0000 [IU]/h | INTRAMUSCULAR | Status: DC
  Administered 2013-04-05: 1250 [IU]/h via INTRAVENOUS
  Administered 2013-04-06: 1600 [IU]/h via INTRAVENOUS
  Administered 2013-04-07: 2500 [IU]/h via INTRAVENOUS
  Administered 2013-04-07: 2100 [IU]/h via INTRAVENOUS
  Administered 2013-04-08 (×4): 2500 [IU]/h via INTRAVENOUS
  Administered 2013-04-09: 2750 [IU]/h via INTRAVENOUS
  Administered 2013-04-09: 2500 [IU]/h via INTRAVENOUS
  Administered 2013-04-10 (×2): 2750 [IU]/h via INTRAVENOUS
  Filled 2013-04-05 (×26): qty 250

## 2013-04-05 MED ORDER — HEPARIN (PORCINE) IN NACL 2-0.9 UNIT/ML-% IJ SOLN
INTRAMUSCULAR | Status: AC
  Filled 2013-04-05: qty 1000

## 2013-04-05 MED ORDER — WARFARIN VIDEO
Freq: Once | Status: AC
  Administered 2013-04-05: 16:00:00

## 2013-04-05 MED ORDER — BIVALIRUDIN 250 MG IV SOLR
INTRAVENOUS | Status: AC
  Filled 2013-04-05: qty 250

## 2013-04-05 MED ORDER — NITROGLYCERIN 0.4 MG SL SUBL: 0.4000 mg | SUBLINGUAL_TABLET | SUBLINGUAL | Status: DC | PRN

## 2013-04-05 MED ORDER — METOPROLOL TARTRATE 12.5 MG HALF TABLET
12.5000 mg | ORAL_TABLET | Freq: Two times a day (BID) | ORAL | Status: DC
  Administered 2013-04-05 – 2013-04-06 (×2): 12.5 mg via ORAL
  Filled 2013-04-05 (×4): qty 1

## 2013-04-05 MED ORDER — INSULIN ASPART 100 UNIT/ML ~~LOC~~ SOLN
20.0000 [IU] | Freq: Once | SUBCUTANEOUS | Status: AC
  Administered 2013-04-05: 20 [IU] via SUBCUTANEOUS

## 2013-04-05 MED ORDER — NITROGLYCERIN IN D5W 200-5 MCG/ML-% IV SOLN
INTRAVENOUS | Status: AC
  Filled 2013-04-05: qty 250

## 2013-04-05 MED ORDER — ASPIRIN 81 MG PO CHEW
324.0000 mg | CHEWABLE_TABLET | Freq: Once | ORAL | Status: AC
  Administered 2013-04-05: 324 mg via ORAL
  Filled 2013-04-05: qty 4

## 2013-04-05 MED ORDER — FENTANYL CITRATE 0.05 MG/ML IJ SOLN
INTRAMUSCULAR | Status: AC
  Filled 2013-04-05: qty 2

## 2013-04-05 MED ORDER — FAMOTIDINE IN NACL 20-0.9 MG/50ML-% IV SOLN
INTRAVENOUS | Status: AC
  Filled 2013-04-05: qty 50

## 2013-04-05 MED ORDER — NITROGLYCERIN IN D5W 200-5 MCG/ML-% IV SOLN
2.0000 ug/min | INTRAVENOUS | Status: DC
  Administered 2013-04-05: 10 ug/min via INTRAVENOUS

## 2013-04-05 MED ORDER — NITROGLYCERIN 0.2 MG/ML ON CALL CATH LAB
INTRAVENOUS | Status: AC
  Filled 2013-04-05: qty 1

## 2013-04-05 MED ORDER — WARFARIN SODIUM 7.5 MG PO TABS
7.5000 mg | ORAL_TABLET | Freq: Once | ORAL | Status: AC
  Administered 2013-04-05: 7.5 mg via ORAL
  Filled 2013-04-05: qty 1

## 2013-04-05 MED ORDER — INSULIN ASPART 100 UNIT/ML ~~LOC~~ SOLN
20.0000 [IU] | Freq: Three times a day (TID) | SUBCUTANEOUS | Status: DC
  Administered 2013-04-05 – 2013-04-11 (×18): 20 [IU] via SUBCUTANEOUS

## 2013-04-05 MED ORDER — LIDOCAINE HCL (PF) 1 % IJ SOLN
INTRAMUSCULAR | Status: AC
  Filled 2013-04-05: qty 30

## 2013-04-05 MED ORDER — SODIUM CHLORIDE 0.9 % IV SOLN
1.0000 mL/kg/h | INTRAVENOUS | Status: AC
  Administered 2013-04-05: 1 mL/kg/h via INTRAVENOUS

## 2013-04-05 MED ORDER — ATORVASTATIN CALCIUM 80 MG PO TABS
80.0000 mg | ORAL_TABLET | Freq: Every day | ORAL | Status: DC
  Filled 2013-04-05: qty 1

## 2013-04-05 MED ORDER — ATORVASTATIN CALCIUM 20 MG PO TABS
20.0000 mg | ORAL_TABLET | Freq: Every day | ORAL | Status: DC
  Administered 2013-04-05 – 2013-04-06 (×2): 20 mg via ORAL
  Filled 2013-04-05 (×3): qty 1

## 2013-04-05 MED ORDER — COUMADIN BOOK
Freq: Once | Status: AC
  Administered 2013-04-05: 16:00:00
  Filled 2013-04-05: qty 1

## 2013-04-05 MED ORDER — INSULIN GLARGINE 100 UNIT/ML ~~LOC~~ SOLN
40.0000 [IU] | Freq: Two times a day (BID) | SUBCUTANEOUS | Status: DC
  Administered 2013-04-05 – 2013-04-11 (×11): 40 [IU] via SUBCUTANEOUS
  Filled 2013-04-05 (×13): qty 0.4

## 2013-04-05 MED ORDER — WARFARIN - PHARMACIST DOSING INPATIENT
Freq: Every day | Status: DC
  Administered 2013-04-05 – 2013-04-08 (×2)

## 2013-04-05 MED ORDER — TIROFIBAN HCL IV 5 MG/100ML
0.1500 ug/kg/min | INTRAVENOUS | Status: AC
  Administered 2013-04-05: 18.03 ug/kg/min via INTRAVENOUS
  Filled 2013-04-05 (×3): qty 100

## 2013-04-05 MED ORDER — INSULIN GLARGINE 100 UNIT/ML ~~LOC~~ SOLN
40.0000 [IU] | Freq: Two times a day (BID) | SUBCUTANEOUS | Status: DC
  Filled 2013-04-05 (×2): qty 0.4

## 2013-04-05 NOTE — ED Notes (Addendum)
Reports dull left side chest pains since last night, increases when breathing. Has taken nitro last night and this am, which helps relieve the pain. ekg done at triage, airway is intact.

## 2013-04-05 NOTE — ED Provider Notes (Signed)
CSN: [id number]     Arrival date & time 04/05/13  1036 History   First MD Initiated Contact with Patient 04/05/13 1044     Chief Complaint  Patient presents with  . Code STEMI     (Consider location/radiation/quality/duration/timing/severity/associated sxs/prior Treatment) HPI Comments: Pt reports CP since 5pm yesterday. He was switched from coumadin to Xarelto about 1 week ago, but has not taken it in about 4 days.   Patient is a 58 y.o. male presenting with chest pain. The history is provided by the patient. No language interpreter was used.  Chest Pain Pain location:  L chest Pain quality: dull   Pain radiates to:  Does not radiate Pain severity:  Moderate Onset quality:  Unable to specify Duration:  18 hours Timing:  Constant Progression:  Waxing and waning Chronicity:  New Context: at rest   Relieved by:  Nitroglycerin Worsened by:  Deep breathing Ineffective treatments:  None tried Associated symptoms: lower extremity edema and shortness of breath   Associated symptoms: no abdominal pain, no back pain, no cough, no dizziness, no dysphagia, no fatigue, no fever, no headache, no nausea, no numbness, not vomiting and no weakness   Risk factors: coronary artery disease, diabetes mellitus, high cholesterol, hypertension, male sex, obesity and prior DVT/PE     Past Medical History  Diagnosis Date  . CAD (coronary artery disease)     status post CABG (I do not have the full details of this. He had an occluded saphenous vein graft to OM, PDA after CABG. he had a cypher stent to the right coronary artery. This is apparently a 4-vesel CABG in 2002.    Marland Kitchen HTN (hypertension)     x 15 years  . Dyslipidemia   . Diabetes mellitus   . Peripheral neuropathy   . Charcot's joint of knee     UNSPECIFIED AREA  . Pulmonary embolism   . Deep venous thrombosis     right lower extremity  . PONV (postoperative nausea and vomiting)   . Myocardial infarction   . Anginal pain   . COPD  (chronic obstructive pulmonary disease)   . Sleep apnea   . Peripheral vascular disease   . GERD (gastroesophageal reflux disease)    Past Surgical History  Procedure Laterality Date  . Left knee surgery    . Coronary artery bypass graft      4 time since 2002  . Coronary angioplasty with stent placement     Family History  Problem Relation Age of Onset  . Heart disease Mother    History  Substance Use Topics  . Smoking status: Never Smoker   . Smokeless tobacco: Never Used  . Alcohol Use: Yes     Comment: rare    Review of Systems  Constitutional: Negative for fever, activity change, appetite change and fatigue.  HENT: Negative for congestion, facial swelling, rhinorrhea and trouble swallowing.   Eyes: Negative for photophobia and pain.  Respiratory: Positive for shortness of breath. Negative for cough and chest tightness.   Cardiovascular: Positive for chest pain. Negative for leg swelling.  Gastrointestinal: Negative for nausea, vomiting, abdominal pain, diarrhea and constipation.  Endocrine: Negative for polydipsia and polyuria.  Genitourinary: Negative for dysuria, urgency, decreased urine volume and difficulty urinating.  Musculoskeletal: Negative for back pain and gait problem.  Skin: Negative for color change, rash and wound.  Allergic/Immunologic: Negative for immunocompromised state.  Neurological: Negative for dizziness, facial asymmetry, speech difficulty, weakness, numbness and headaches.  Psychiatric/Behavioral: Negative  for confusion, decreased concentration and agitation.      Allergies  Review of patient's allergies indicates no known allergies.  Home Medications   No current outpatient prescriptions on file. BP 120/60  Pulse 70  Temp(Src) 98.8 F (37.1 C) (Oral)  Resp 25  Ht 6' (1.829 m)  Wt 264 lb 1.8 oz (119.8 kg)  BMI 35.81 kg/m2  SpO2 95% Physical Exam  Constitutional: He is oriented to person, place, and time. He appears well-developed  and well-nourished. No distress.  HENT:  Head: Normocephalic and atraumatic.  Mouth/Throat: No oropharyngeal exudate.  Eyes: Pupils are equal, round, and reactive to light.  Neck: Normal range of motion. Neck supple.  Cardiovascular: Normal rate, regular rhythm and normal heart sounds.  Exam reveals no gallop and no friction rub.   No murmur heard. Pulmonary/Chest: Effort normal and breath sounds normal. No respiratory distress. He has no wheezes. He has no rales.  Abdominal: Soft. Bowel sounds are normal. He exhibits no distension and no mass. There is no tenderness. There is no rebound and no guarding.  Musculoskeletal: Normal range of motion. He exhibits edema (1+ RLE edema). He exhibits no tenderness.  Neurological: He is alert and oriented to person, place, and time.  Skin: Skin is warm and dry.  Psychiatric: He has a normal mood and affect.    ED Course  Procedures (including critical care time) Labs Review Labs Reviewed  BASIC METABOLIC PANEL - Abnormal; Notable for the following:    Sodium 134 (*)    Glucose, Bld 338 (*)    GFR calc non Af Amer 88 (*)    All other components within normal limits  CBC - Abnormal; Notable for the following:    WBC 13.7 (*)    All other components within normal limits  DIFFERENTIAL - Abnormal; Notable for the following:    Neutro Abs 9.6 (*)    Monocytes Absolute 1.6 (*)    All other components within normal limits  PROTIME-INR - Abnormal; Notable for the following:    Prothrombin Time 15.5 (*)    All other components within normal limits  TROPONIN I - Abnormal; Notable for the following:    Troponin I >20.00 (*)    All other components within normal limits  TROPONIN I - Abnormal; Notable for the following:    Troponin I 12.82 (*)    All other components within normal limits  GLUCOSE, CAPILLARY - Abnormal; Notable for the following:    Glucose-Capillary 342 (*)    All other components within normal limits  GLUCOSE, CAPILLARY -  Abnormal; Notable for the following:    Glucose-Capillary 292 (*)    All other components within normal limits  I-STAT CHEM 8, ED - Abnormal; Notable for the following:    Sodium 135 (*)    Glucose, Bld 343 (*)    All other components within normal limits  I-STAT TROPOININ, ED - Abnormal; Notable for the following:    Troponin i, poc 4.54 (*)    All other components within normal limits  MRSA PCR SCREENING  MRSA PCR SCREENING  APTT  PLATELET COUNT  TROPONIN I  CBC  BASIC METABOLIC PANEL  HEMOGLOBIN A1C  PROTIME-INR  HEPARIN LEVEL (UNFRACTIONATED)  I-STAT CHEM 8, ED   Imaging Review Dg Chest Portable 1 View  04/05/2013   CLINICAL DATA:  Chest pain. Shortness of breath. Acute myocardial infarction. Prior CABG.  EXAM: PORTABLE CHEST - 1 VIEW  COMPARISON:  04/2011  FINDINGS: Heart size remains  within normal limits. Left basilar pleural-parenchymal scarring is again seen. No evidence of acute infiltrate or pulmonary edema. No evidence of pleural effusion. Prior CABG again noted.  IMPRESSION: Stable left basilar pleural-parenchymal scarring. No acute findings.   Electronically Signed   By: Earle Gell M.D.   On: 04/05/2013 11:34     EKG: normal sinus rhythm, ST elevation in II, III, aVF.  ACUTE STEMI.  See muse for full reading.     MDM   Final diagnoses:  ST elevation myocardial infarction (STEMI) of inferolateral wall    Pt is a 58 y.o. male with Pmhx as above including CAD, PE/DVT who presents with L sided dull chest pressure since about 5pm yesterday, improved, but not relieved by NTG at home.  Triage EKG shows STEMI w/ new STE in II, III, aVF and reciprocal changes in I, aVL.  Pt reports switching from warfarin to Xarelto about 1 week ago, but had not taken it for about 4 days.  On PE, VSS, pt tearful.  BP and pulses equal and BLUE.  Cardiology made aware of pt.  Suspect STEMI given EKG changes, although PE also a possibility (less likely given nml HR & o2 sat).  ASA and heparin  bolus to be given. Cardiology will take to cath lab.         Neta Ehlers, MD 04/05/13 2109

## 2013-04-05 NOTE — Consult Note (Signed)
ANTICOAGULATION CONSULT NOTE - Initial Consult  Pharmacy Consult for heparin/warfarin Indication: STEMI / hx DVT  No Known Allergies  Patient Measurements: Height: 6' (182.9 cm) Weight: 265 lb (120.203 kg) IBW/kg (Calculated) : 77.6 Heparin Dosing Weight: 104 kg  Vital Signs: Temp: 98.7 F (37.1 C) (02/21 1300) Temp src: Oral (02/21 1300) BP: 115/47 mmHg (02/21 1347) Pulse Rate: 63 (02/21 1347)  Labs:  Recent Labs  04/05/13 1057 04/05/13 1104  HGB 15.0 16.0  HCT 42.7 47.0  PLT 153  --   APTT 28  --   LABPROT 15.5*  --   INR 1.26  --   CREATININE 0.99 1.20    Estimated Creatinine Clearance: 89.8 ml/min (by C-G formula based on Cr of 1.2).   Medical History: Past Medical History  Diagnosis Date  . CAD (coronary artery disease)     status post CABG (I do not have the full details of this. He had an occluded saphenous vein graft to OM, PDA after CABG. he had a cypher stent to the right coronary artery. This is apparently a 4-vesel CABG in 2002.    Marland Kitchen HTN (hypertension)     x 15 years  . Dyslipidemia   . Diabetes mellitus   . Peripheral neuropathy   . Charcot's joint of knee     UNSPECIFIED AREA  . Pulmonary embolism   . Deep venous thrombosis     right lower extremity    Medications:  Scheduled:  . [START ON 04/06/2013] aspirin  81 mg Oral Daily  . atorvastatin  20 mg Oral q1800  . [START ON 04/06/2013] clopidogrel  75 mg Oral Q breakfast  . insulin aspart  20 Units Subcutaneous TID WC  . insulin glargine  40 Units Subcutaneous BID  . [START ON 04/06/2013] losartan  50 mg Oral Daily  . metoprolol tartrate  12.5 mg Oral BID  . tirofiban  25 mcg/kg Intravenous Once    Assessment: Bobby Lindsey is a 59 yo M with PMH of CAD s/p CABG, prior recurrent DVT/PE, DM, HTN, and HLD who presented to the ED 2/21 with acute STEMI. He received 324 mg aspirin and a 4000 unit heparin bolus in the ED and then taken emergently to cath where he underwent PCI with DES to  circumflex. During PCI, he received Angiomax as well as Aggrastat. Aggrastat to continue for 12 hours s/p cath.   Of note, patient had previously been on warfarin, though was changed to Xarelto recently, though patient admits non-adherence to his regimen, stating he took 1/2 tablet today (10mg ) but hasn't taken any in the last 4 days.  Pharmacy has been consulted to begin heparin 8 hours post sheath removal (which occurred at 1230) and begin coumadin.   Sheath removed at 1230 - will start heparin 8 hours from then (2030)  Coumadin score = 7. Baseline INR 1.26. Hgb 16, platelets 153  Note: Angiomax has the potential to falsely elevate the INR.   Goal of Therapy:  INR 2-3 Heparin level 0.3 - 0.7 Monitor platelets by anticoagulation protocol: Yes   Plan:  -Begin heparin without bolus at 1250 units/hr at 2030; check level with AM labs -Start coumadin 7.5 mg PO x 1 dose tonight -Daily INR, HL, and CBC -Coumadin education book and video ordered -Monitor for bleeding complications  [name], PharmD Clinical Pharmacist-Resident Pager: [phone] Pharmacy: [phone] 04/05/2013 2:12 PM

## 2013-04-05 NOTE — Progress Notes (Signed)
EKG CRITICAL VALUE     12 lead EKG performed.  Critical value noted. Dr. Stanford Breed notified   [place], Katharine Look C, CCT 04/05/2013 1:39 PM

## 2013-04-05 NOTE — H&P (Signed)
HPI: 58 year old male with past medical history of coronary artery disease status post coronary artery bypass and graft, prior recurrent DVT and pulmonary embolus, diabetes, hypertension and hyperlipidemia with acute inferior myocardial infarction. Patient is status post coronary artery bypass and graft in 2002 at [facility]. He is followed by Dr. Percival Spanish. Patient states that at noon yesterday he had substernal chest pain with some radiation to his left upper extremity. There was dyspnea and nausea but no diaphoresis. He took 2 sublingual nitroglycerin. His pain improved. His symptoms returned at midnight. The symptoms persisted and he presented to the emergency room. His electrocardiogram showed sinus rhythm with inferior ST elevation and lateral ST depression. Patient was seen by Dr. Martinique and underwent PCI of his circumflex. He is now admitted for further management.  Medications Prior to Admission  Medication Sig Dispense Refill  . Calcium 500 MG CHEW Chew 500 mg by mouth daily.      Marland Kitchen ibuprofen (ADVIL,MOTRIN) 400 MG tablet Take 400 mg by mouth every 6 (six) hours as needed for headache or mild pain.      Marland Kitchen insulin glargine (LANTUS) 100 UNIT/ML injection Inject 40 Units into the skin 2 (two) times daily.       . insulin lispro (HUMALOG KWIKPEN) 100 UNIT/ML injection Inject 20 Units into the skin 3 (three) times daily before meals. UAD       . losartan (COZAAR) 50 MG tablet Take 1 tablet (50 mg total) by mouth daily.  30 tablet  11  . metoprolol succinate (TOPROL-XL) 25 MG 24 hr tablet Take 1 tablet (25 mg total) by mouth daily.  30 tablet  11  . nitroGLYCERIN (NITROSTAT) 0.4 MG SL tablet Place 1 tablet (0.4 mg total) under the tongue every 5 (five) minutes as needed.  25 tablet  6  . Rivaroxaban (XARELTO) 20 MG TABS tablet Take 1 tablet (20 mg total) by mouth daily with supper.  30 tablet  11    No Known Allergies  Past Medical History  Diagnosis Date  . CAD (coronary artery  disease)     status post CABG (I do not have the full details of this. He had an occluded saphenous vein graft to OM, PDA after CABG. he had a cypher stent to the right coronary artery. This is apparently a 4-vesel CABG in 2002.    Marland Kitchen HTN (hypertension)     x 15 years  . Dyslipidemia   . Diabetes mellitus   . Peripheral neuropathy   . Charcot's joint of knee     UNSPECIFIED AREA  . Pulmonary embolism   . Deep venous thrombosis     right lower extremity    Past Surgical History  Procedure Laterality Date  . Left knee surgery    . Coronary artery bypass graft      4 time since 2002  . Coronary angioplasty with stent placement      History   Social History  . Marital Status: Married    Spouse Name: N/A    Number of Children: N/A  . Years of Education: N/A   Occupational History  . CONSTRUCTION     General contractor   Social History Main Topics  . Smoking status: Never Smoker   . Smokeless tobacco: Never Used  . Alcohol Use: Yes     Comment: rare  . Drug Use: Not on file  . Sexual Activity: Not on file   Other Topics Concern  . 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     Family History  Problem Relation Age of Onset  . Heart disease Mother     ROS:  Body aches but no fevers or chills, productive cough, hemoptysis, dysphasia, odynophagia, melena, hematochezia, dysuria, hematuria, rash, seizure activity, orthopnea, PND, pedal edema, claudication. Remaining systems are negative.  Physical Exam:   Blood pressure 107/55, pulse 66, temperature 98.7 F (37.1 C), temperature source Oral, resp. rate 15, height 6' (1.829 m), weight 265 lb (120.203 kg), SpO2 96.00%.  General:  Well developed/well nourished in NAD Skin warm/dry Patient not depressed No peripheral clubbing Back-normal HEENT-normal/normal eyelids Neck supple/normal carotid upstroke bilaterally; no bruits; no JVD; no thyromegaly chest - CTA/ normal  expansion, previous sternotomy CV - RRR/normal S1 and S2; no rubs or gallops;  PMI nondisplaced, 1/6 systolic murmur LSB Abdomen -NT/ND, no HSM, no mass, + bowel sounds, no bruit 2+ femoral pulses, no bruits Ext-trace edema, no chords, 2+ DP, ulcer left calf Neuro-grossly nonfocal  ECG sinus rhythm, inferior ST elevation and lateral ST depression consistent with acute infarct  Results for orders placed during the hospital encounter of 04/05/13 (from the past 48 hour(s))  APTT     Status: None   Collection Time    04/05/13 10:57 AM      Result Value Ref Range   aPTT 28  24 - 37 seconds  BASIC METABOLIC PANEL     Status: Abnormal   Collection Time    04/05/13 10:57 AM      Result Value Ref Range   Sodium 134 (*) 137 - 147 mEq/L   Potassium 4.8  3.7 - 5.3 mEq/L   Chloride 98  96 - 112 mEq/L   CO2 25  19 - 32 mEq/L   Glucose, Bld 338 (*) 70 - 99 mg/dL   BUN 19  6 - 23 mg/dL   Creatinine, Ser 0.99  0.50 - 1.35 mg/dL   Calcium 9.0  8.4 - 10.5 mg/dL   GFR calc non Af Amer 88 (*) >90 mL/min   GFR calc Af Amer >90  >90 mL/min   Comment: (NOTE)     The eGFR has been calculated using the CKD EPI equation.     This calculation has not been validated in all clinical situations.     eGFR's persistently <90 mL/min signify possible Chronic Kidney     Disease.  CBC     Status: Abnormal   Collection Time    04/05/13 10:57 AM      Result Value Ref Range   WBC 13.7 (*) 4.0 - 10.5 K/uL   RBC 5.02  4.22 - 5.81 MIL/uL   Hemoglobin 15.0  13.0 - 17.0 g/dL   HCT 42.7  39.0 - 52.0 %   MCV 85.1  78.0 - 100.0 fL   MCH 29.9  26.0 - 34.0 pg   MCHC 35.1  30.0 - 36.0 g/dL   RDW 13.5  11.5 - 15.5 %   Platelets 153  150 - 400 K/uL  DIFFERENTIAL     Status: Abnormal   Collection Time    04/05/13 10:57 AM      Result Value Ref Range   Neutrophils Relative % 71  43 - 77 %   Neutro Abs 9.6 (*) 1.7 - 7.7 K/uL   Lymphocytes Relative 17  12 - 46 %   Lymphs Abs 2.3  0.7 - 4.0 K/uL   Monocytes Relative 12   3 -  12 %   Monocytes Absolute 1.6 (*) 0.1 - 1.0 K/uL   Eosinophils Relative 1  0 - 5 %   Eosinophils Absolute 0.1  0.0 - 0.7 K/uL   Basophils Relative 0  0 - 1 %   Basophils Absolute 0.0  0.0 - 0.1 K/uL  PROTIME-INR     Status: Abnormal   Collection Time    04/05/13 10:57 AM      Result Value Ref Range   Prothrombin Time 15.5 (*) 11.6 - 15.2 seconds   INR 1.26  0.00 - 1.49  I-STAT TROPOININ, ED     Status: Abnormal   Collection Time    04/05/13 11:01 AM      Result Value Ref Range   Troponin i, poc 4.54 (*) 0.00 - 0.08 ng/mL   Comment NOTIFIED PHYSICIAN     Comment 3            Comment: Due to the release kinetics of cTnI,     a negative result within the first hours     of the onset of symptoms does not rule out     myocardial infarction with certainty.     If myocardial infarction is still suspected,     repeat the test at appropriate intervals.  I-STAT CHEM 8, ED     Status: Abnormal   Collection Time    04/05/13 11:04 AM      Result Value Ref Range   Sodium 135 (*) 137 - 147 mEq/L   Potassium 4.6  3.7 - 5.3 mEq/L   Chloride 97  96 - 112 mEq/L   BUN 19  6 - 23 mg/dL   Creatinine, Ser 1.20  0.50 - 1.35 mg/dL   Glucose, Bld 343 (*) 70 - 99 mg/dL   Calcium, Ion 1.21  1.12 - 1.23 mmol/L   TCO2 24  0 - 100 mmol/L   Hemoglobin 16.0  13.0 - 17.0 g/dL   HCT 47.0  39.0 - 52.0 %    Dg Chest Portable 1 View  04/05/2013   CLINICAL DATA:  Chest pain. Shortness of breath. Acute myocardial infarction. Prior CABG.  EXAM: PORTABLE CHEST - 1 VIEW  COMPARISON:  04/2011  FINDINGS: Heart size remains within normal limits. Left basilar pleural-parenchymal scarring is again seen. No evidence of acute infiltrate or pulmonary edema. No evidence of pleural effusion. Prior CABG again noted.  IMPRESSION: Stable left basilar pleural-parenchymal scarring. No acute findings.   Electronically Signed   By: Earle Gell M.D.   On: 04/05/2013 11:34    Assessment/Plan 1 acute inferior myocardial  infarction-the patient is now status post PCI of his left circumflex. He has residual ST elevation but his pain has improved. Continue to cycle enzymes. Treat with aspirin, Plavix and statin. Continue beta blocker. 2 history of recurrent DVT and pulmonary embolus-add heparin once he has completed aggrostat. He does not want to take xarelto. Begin Coumadin. Would continue aspirin, Plavix and Coumadin for one month and then Coumadin and Plavix long-term. 3 diabetes mellitus-continue insulin and follow CBGs. 4 hyperlipidemia-resume statin. I will use low dose as he has had an elevated CK previously. Repeat lipids, liver and CK in 4 weeks. 5 hypertension-resume ARB in a.m. If blood pressure allows. 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 Resume ARB in the morning. Continue beta blocker.  Kirk Ruths MD 04/05/2013, 1:33 PM

## 2013-04-05 NOTE — CV Procedure (Addendum)
Cardiac Catheterization Procedure Note  Name: Bobby Lindsey MRN: [id number] DOB: [birth year]  Procedure: Left Heart Cath, Selective Coronary Angiography, SVG angiography, LIMA angiography, LV angiography,  PTCA/Stent of the mid to distal LCx.  Indication: 58 yo WM presents with an inferolateral STEMI. He has a history of Prior CABG remotely at [facility]. He had early failure of SVG to PDA and SVG to ? Diagonal and underwent Cypher stent of the native RCA. He also has a history of recurrent DVT/PE and has been on Xarelto. He has not taken this in 3 days and is on no antiplatelet therapy. His chest pain symptoms started 24 hours prior to admission. Presenting Ecg shows 2-3 mm ST elevation in the inferior leads with reciprocal ST depression in 1 and Avl.   Diagnostic Procedure Details: The right groin was prepped, draped, and anesthetized with 1% lidocaine. Using the modified Seldinger technique, a 6 French sheath was introduced into the right femoral artery. Standard Judkins catheters were used for selective coronary angiography and left ventriculography. Catheter exchanges were performed over a wire.  The diagnostic procedure was well-tolerated without immediate complications.  PROCEDURAL FINDINGS Hemodynamics: AO 137/68 mean 93 mm Hg LV 139/31 mm Hg  Coronary angiography: Coronary dominance: right  Left mainstem: Normal  Left anterior descending (LAD): The LAD is occluded in the mid vessel following a tiny second diagonal branch. The first diagonal branch is severely diseased proximally up to 90%. It then trifurcates into 3 small branches. The more apical branch is occluded.  Left circumflex (LCx): The LCx gives off one OM branch and then is occluded in the mid to distal vessel. The first OM is severely diseased proximally up to 90-95%. It is supplied by a vein graft.  Right coronary artery (RCA): The RCA has stents in the proximal vessel and at the crux. These are both patent. The RCA has  diffuse nonobstructive disease less than 30%. The RV marginal branch is occluded and fills by collaterals.  The SVG to the diagonal is occluded proximally  The SVG to the PDA is occluded proximally.  The SVG to the OM1 is patent. There is a 40-50% stenosis in the body of the SVG  The LIMA to the LAD is patent.   Left ventriculography: Left ventricular systolic function is abnormal, LVEF is estimated at 40%, there is severe hypokinesis of the inferior wall. There is no significant mitral regurgitation   PCI Procedure Note:  Given patient's complex anatomy with prior graft occlusions there was some delay as we identified the culprit infarct artery.Following the diagnostic procedure, the decision was made to proceed with PCI of the distal LCx.  Weight-based bivalirudin was given for anticoagulation. Plavix 600 mg was given orally. Once a therapeutic ACT was achieved, a 6 [place] XBLAD 4 guide catheter was inserted.  A prowater coronary guidewire was used to cross the lesion.  The lesion was predilated with a 2.0 mm balloon.  With reperfusion there was a long lesion in the vessel. Distally the LCx gives off 3 small PLOM branches. The lesion was then stented with a 2.5 x 28 mm Promus stent.  The stent was postdilated with a 2.5 mm noncompliant balloon.  Following PCI, there was 0% residual stenosis and TIMI-3 flow. Final angiography confirmed an excellent result. Femoral hemostasis was achieved with an Angioseal device. At the end of procedure the patient received a single bolus of Aggrastat followed by infusion and Bivalirudin was discontinued.  The patient tolerated the PCI procedure well. There  were no immediate procedural complications.  The patient was transferred to the post catheterization recovery area for further monitoring. 255 cc on contrast was used.  PCI Data: Vessel - LCx/Segment - mid to distal Percent Stenosis (pre)  100% TIMI-flow 0 Stent 2.5 x 28 mm Promus Percent Stenosis (post)  0% TIMI-flow (post) 3  Final Conclusions:   1. Severe 3 vessel obstructive CAD 2. Patent LIMA to the LAD 3. Patent SVG to the OM 4. Occluded SVG to the diagonal 5. Occluded SVG to the PDA 6. Patent stents in the native RCA 7. Moderate LV dysfunction 8. Successful stenting of the mid to distal native LCx with a DES. Given the small caliber of vessel and length of the lesion I did not feel a BMS was appropriate.   Recommendations: Continue dual antiplatelet therapy with ASA and Plavix for one month then Plavix indefinitely. Will need to resume anticoagulant therapy with either coumadin or Xarelto as well. Will continue Aggrastat for 12 hours and start IV heparin in 8 hours. Patient transferred to the ICU for further management.  Collier [facility] 04/05/2013, 12:40 PM

## 2013-04-05 NOTE — ED Notes (Signed)
Pt reports that had episode of chest pain on Monday, took nitro and it resolved, chest pains returned last night and this am. ekg was done at triage, shown to dr and stemi called.

## 2013-04-06 LAB — BASIC METABOLIC PANEL
BUN: 23 mg/dL (ref 6–23)
CO2: 23 mEq/L (ref 19–32)
Calcium: 8.9 mg/dL (ref 8.4–10.5)
Chloride: 97 mEq/L (ref 96–112)
Creatinine, Ser: 1.17 mg/dL (ref 0.50–1.35)
GFR calc Af Amer: 78 mL/min — ABNORMAL LOW (ref >90)
GFR calc non Af Amer: 67 mL/min — ABNORMAL LOW (ref >90)
Glucose, Bld: 214 mg/dL — ABNORMAL HIGH (ref 70–99)
Potassium: 4.7 mEq/L (ref 3.7–5.3)
Sodium: 133 mEq/L — ABNORMAL LOW (ref 137–147)

## 2013-04-06 LAB — CBC
HCT: 40.4 % (ref 39.0–52.0)
Hemoglobin: 14 g/dL (ref 13.0–17.0)
MCH: 29.6 pg (ref 26.0–34.0)
MCHC: 34.7 g/dL (ref 30.0–36.0)
MCV: 85.4 fL (ref 78.0–100.0)
Platelets: 149 10*3/uL — ABNORMAL LOW (ref 150–400)
RBC: 4.73 MIL/uL (ref 4.22–5.81)
RDW: 13.8 % (ref 11.5–15.5)
WBC: 15.5 10*3/uL — ABNORMAL HIGH (ref 4.0–10.5)

## 2013-04-06 LAB — PROTIME-INR
INR: 1.21 (ref 0.00–1.49)
Prothrombin Time: 15 seconds (ref 11.6–15.2)

## 2013-04-06 LAB — GLUCOSE, CAPILLARY
Glucose-Capillary: 194 mg/dL — ABNORMAL HIGH (ref 70–99)
Glucose-Capillary: 222 mg/dL — ABNORMAL HIGH (ref 70–99)
Glucose-Capillary: 305 mg/dL — ABNORMAL HIGH (ref 70–99)
Glucose-Capillary: 240 mg/dL — ABNORMAL HIGH (ref 70–99)
Glucose-Capillary: 141 mg/dL — ABNORMAL HIGH (ref 70–99)

## 2013-04-06 LAB — HEPARIN LEVEL (UNFRACTIONATED)
Heparin Unfractionated: 0.35 IU/mL (ref 0.30–0.70)
Heparin Unfractionated: <0.1 IU/mL — ABNORMAL LOW (ref 0.30–0.70)
Heparin Unfractionated: 0.26 IU/mL — ABNORMAL LOW (ref 0.30–0.70)

## 2013-04-06 LAB — TROPONIN I: Troponin I: 8.53 ng/mL (ref <0.30)

## 2013-04-06 LAB — HEMOGLOBIN A1C
Hgb A1c MFr Bld: 10.7 % — ABNORMAL HIGH (ref <5.7)
Mean Plasma Glucose: 260 mg/dL — ABNORMAL HIGH (ref <117)

## 2013-04-06 MED ORDER — WARFARIN SODIUM 7.5 MG PO TABS
7.5000 mg | ORAL_TABLET | Freq: Once | ORAL | Status: AC
  Administered 2013-04-06: 7.5 mg via ORAL
  Filled 2013-04-06: qty 1

## 2013-04-06 MED ORDER — HEPARIN BOLUS VIA INFUSION
3100.0000 [IU] | Freq: Once | INTRAVENOUS | Status: AC
  Administered 2013-04-06: 3100 [IU] via INTRAVENOUS
  Filled 2013-04-06: qty 3100

## 2013-04-06 NOTE — Progress Notes (Addendum)
ANTICOAGULATION CONSULT NOTE - Follow Up Consult  Pharmacy Consult for heparin/warfarin Indication: STEMI / hx DVT  No Known Allergies  Patient Measurements: Height: 6' (182.9 cm) Weight: 264 lb 1.8 oz (119.8 kg) IBW/kg (Calculated) : 77.6 Heparin Dosing Weight: 104 kg  Vital Signs: Temp: 98.6 F (37 C) (02/22 0400) Temp src: Oral (02/22 0400) BP: 145/75 mmHg (02/22 0600) Pulse Rate: 62 (02/22 0600)  Labs:  Recent Labs  04/05/13 1057 04/05/13 1104 04/05/13 1505 04/05/13 1942 04/06/13 0150  HGB 15.0 16.0  --   --  14.0  HCT 42.7 47.0  --   --  40.4  PLT 153  --   --  160 149*  APTT 28  --   --   --   --   LABPROT 15.5*  --   --   --  15.0  INR 1.26  --   --   --  1.21  HEPARINUNFRC  --   --   --   --  0.35  CREATININE 0.99 1.20  --   --  1.17  TROPONINI  --   --  >20.00* 12.82* 8.53*    Estimated Creatinine Clearance: 92 ml/min (by C-G formula based on Cr of 1.17).   Medical History: Past Medical History  Diagnosis Date  . CAD (coronary artery disease)     status post CABG (I do not have the full details of this. He had an occluded saphenous vein graft to OM, PDA after CABG. he had a cypher stent to the right coronary artery. This is apparently a 4-vesel CABG in 2002.    Marland Kitchen HTN (hypertension)     x 15 years  . Dyslipidemia   . Diabetes mellitus   . Peripheral neuropathy   . Charcot's joint of knee     UNSPECIFIED AREA  . Pulmonary embolism   . Deep venous thrombosis     right lower extremity  . PONV (postoperative nausea and vomiting)   . Myocardial infarction   . Anginal pain   . COPD (chronic obstructive pulmonary disease)   . Sleep apnea   . Peripheral vascular disease   . GERD (gastroesophageal reflux disease)     Medications:  Scheduled:  . aspirin  81 mg Oral Daily  . atorvastatin  20 mg Oral q1800  . clopidogrel  75 mg Oral Q breakfast  . insulin aspart  20 Units Subcutaneous TID WC  . insulin glargine  40 Units Subcutaneous BID  .  losartan  50 mg Oral Daily  . metoprolol tartrate  12.5 mg Oral BID  . Warfarin - Pharmacist Dosing Inpatient   Does not apply q1800    Assessment: Mr. Metzker is a 58 yo M with PMH of CAD s/p CABG, prior recurrent DVT/PE, DM, HTN, and HLD who presented to the ED 2/21 with acute STEMI. He received 324 mg aspirin and a 4000 unit heparin bolus in the ED and then taken emergently to cath where he underwent PCI with DES to circumflex. During PCI, he received Angiomax as well as Aggrastat. Aggrastat continued for 12 hours s/p cath.   Of note, patient had previously been on warfarin, though was changed to Xarelto recently, though patient admits non-adherence to his regimen, stating he took 1/2 tablet today (10mg ) but hasn't taken any in the last 4 days.  Pharmacy has been consulted to manage patient's heparin and warfarin therapy. Coumadin score = 7.    Heparin level this morning was therapeutic at 0.35 though level  was drawn 3 hours early. Will check level again, ~6 hours from last level (0900)  INR is subtherapeutic at 1.21, though patient has only received one dose. Hgb 14, platelets 149. No bleeding issues noted.  Note: Angiomax has the potential to falsely elevate the INR.   Goal of Therapy:  INR 2-3 Heparin level 0.3 - 0.7 Monitor platelets by anticoagulation protocol: Yes   Plan:  -Further heparin plans once heparin level results -Coumadin 7.5 mg PO x 1 dose tonight -Daily INR, HL, and CBC -Monitor for bleeding complications  Gwen Edler C. Caprice Wasko, PharmD Clinical Pharmacist-Resident Pager: [phone] Pharmacy: [phone] 04/06/2013 7:25 AM  Addendum: 0900 Heparin level undetectable.  Plan:  -Heparin bolus of 3100 units (~30 units/kg) -Increase heparin drip to 1600 units/hr (increase of ~3.5 units/kg/hr) -Check 6 hour heparin level (at 1715) -Coumadin 7.5 mg PO x 1 dose tonight -Daily INR, HL, and CBC -Monitor for bleeding complications  Nadalee Neiswender C. Ricky Doan, PharmD Clinical  Pharmacist-Resident Pager: [phone] Pharmacy: [phone] 04/06/2013 11:07 AM

## 2013-04-06 NOTE — Discharge Instructions (Signed)
Information on my medicine - Coumadin   (Warfarin)  This medication education was reviewed with me or my healthcare representative as part of my discharge preparation.  The pharmacist that spoke with me during my hospital stay was:  Lorenda Peck, [facility]  Why was Coumadin prescribed for you? Coumadin was prescribed for you because you have a blood clot or a medical condition that can cause an increased risk of forming blood clots. Blood clots can cause serious health problems by blocking the flow of blood to the heart, lung, or brain. Coumadin can prevent harmful blood clots from forming. As a reminder your indication for Coumadin is:   Deep Vein Thrombosis Treatment  What test will check on my response to Coumadin? While on Coumadin (warfarin) you will need to have an INR test regularly to ensure that your dose is keeping you in the desired range. The INR (international normalized ratio) number is calculated from the result of the laboratory test called prothrombin time (PT).  If an INR APPOINTMENT HAS NOT ALREADY BEEN MADE FOR YOU please schedule an appointment to have this lab work done by your health care provider within 7 days. Your INR goal is usually a number between:  2 to 3 or your provider may give you a more narrow range like 2-2.5.  Ask your health care provider during an office visit what your goal INR is.  What  do you need to  know  About  COUMADIN? Take Coumadin (warfarin) exactly as prescribed by your healthcare provider about the same time each day.  DO NOT stop taking without talking to the doctor who prescribed the medication.  Stopping without other blood clot prevention medication to take the place of Coumadin may increase your risk of developing a new clot or stroke.  Get refills before you run out.  What do you do if you miss a dose? If you miss a dose, take it as soon as you remember on the same day then continue your regularly scheduled regimen the next day.  Do not take  two doses of Coumadin at the same time.  Important Safety Information A possible side effect of Coumadin (Warfarin) is an increased risk of bleeding. You should call your healthcare provider right away if you experience any of the following:   Bleeding from an injury or your nose that does not stop.   Unusual colored urine (red or dark brown) or unusual colored stools (red or black).   Unusual bruising for unknown reasons.   A serious fall or if you hit your head (even if there is no bleeding).  Some foods or medicines interact with Coumadin (warfarin) and might alter your response to warfarin. To help avoid this:   Eat a balanced diet, maintaining a consistent amount of Vitamin K.   Notify your provider about major diet changes you plan to make.   Avoid alcohol or limit your intake to 1 drink for women and 2 drinks for men per day. (1 drink is 5 oz. wine, 12 oz. beer, or 1.5 oz. liquor.)  Make sure that ANY health care provider who prescribes medication for you knows that you are taking Coumadin (warfarin).  Also make sure the healthcare provider who is monitoring your Coumadin knows when you have started a new medication including herbals and non-prescription products.  Coumadin (Warfarin)  Major Drug Interactions  Increased Warfarin Effect Decreased Warfarin Effect  Alcohol (large quantities) Antibiotics (esp. Septra/Bactrim, Flagyl, Cipro) Amiodarone (Cordarone) Aspirin (ASA) Cimetidine (  Tagamet) Megestrol (Megace) NSAIDs (ibuprofen, naproxen, etc.) Piroxicam (Feldene) Propafenone (Rythmol SR) Propranolol (Inderal) Isoniazid (INH) Posaconazole (Noxafil) Barbiturates (Phenobarbital) Carbamazepine (Tegretol) Chlordiazepoxide (Librium) Cholestyramine (Questran) Griseofulvin Oral Contraceptives Rifampin Sucralfate (Carafate) Vitamin K   Coumadin (Warfarin) Major Herbal Interactions  Increased Warfarin Effect Decreased Warfarin Effect  Garlic Ginseng Ginkgo biloba  Coenzyme Q10 Green tea St. Johns wort    Coumadin (Warfarin) FOOD Interactions  Eat a consistent number of servings per week of foods HIGH in Vitamin K (1 serving =  cup)  Collards (cooked, or boiled & drained) Kale (cooked, or boiled & drained) Mustard greens (cooked, or boiled & drained) Parsley *serving size only =  cup Spinach (cooked, or boiled & drained) Swiss chard (cooked, or boiled & drained) Turnip greens (cooked, or boiled & drained)  Eat a consistent number of servings per week of foods MEDIUM-HIGH in Vitamin K (1 serving = 1 cup)  Asparagus (cooked, or boiled & drained) Broccoli (cooked, boiled & drained, or raw & chopped) Brussel sprouts (cooked, or boiled & drained) *serving size only =  cup Lettuce, raw (green leaf, endive, romaine) Spinach, raw Turnip greens, raw & chopped   These websites have more information on Coumadin (warfarin):  FailFactory.se; VeganReport.com.au;

## 2013-04-06 NOTE — Progress Notes (Signed)
EKG CRITICAL VALUE     12 lead EKG performed.  Critical value noted.  Loni Muse, RN notified.   Morene Cecilio C, CCT 04/06/2013 7:18 AM

## 2013-04-06 NOTE — Progress Notes (Signed)
ANTICOAGULATION CONSULT NOTE - Follow Up Consult  Pharmacy Consult for heparin Indication: STEMI / hx DVT  No Known Allergies  Patient Measurements: Height: 6' (182.9 cm) Weight: 264 lb 1.8 oz (119.8 kg) IBW/kg (Calculated) : 77.6 Heparin Dosing Weight: 104 kg  Vital Signs: Temp: 97 F (36.1 C) (02/22 1343) Temp src: Oral (02/22 1343) BP: 124/59 mmHg (02/22 1343) Pulse Rate: 63 (02/22 0900)  Labs:  Recent Labs  04/05/13 1057 04/05/13 1104 04/05/13 1505 04/05/13 1942 04/06/13 0150 04/06/13 0934 04/06/13 1653  HGB 15.0 16.0  --   --  14.0  --   --   HCT 42.7 47.0  --   --  40.4  --   --   PLT 153  --   --  160 149*  --   --   APTT 28  --   --   --   --   --   --   LABPROT 15.5*  --   --   --  15.0  --   --   INR 1.26  --   --   --  1.21  --   --   HEPARINUNFRC  --   --   --   --  0.35 <0.10* 0.26*  CREATININE 0.99 1.20  --   --  1.17  --   --   TROPONINI  --   --  >20.00* 12.82* 8.53*  --   --     Estimated Creatinine Clearance: 92 ml/min (by C-G formula based on Cr of 1.17).   Medications:  . sodium chloride Stopped (04/06/13 0500)  . heparin 1,600 Units/hr (04/06/13 1348)  . nitroGLYCERIN Stopped (04/05/13 1707)    Assessment: Mr. Phong is a 58 yo M with PMH of CAD s/p CABG, prior recurrent DVT/PE, DM, HTN, and HLD who presented to the ED 2/21 with acute STEMI. He received 324 mg aspirin and a 4000 unit heparin bolus in the ED and then taken emergently to cath where he underwent PCI with DES to circumflex. During PCI, he received Angiomax as well as Aggrastat. Aggrastat continued for 12 hours s/p cath.   Heparin level is slightly subtherapeutic at 0.26 on 1600 units/hr. No bleeding noted.  Goal of Therapy:  Heparin level 0.3 - 0.7 Monitor platelets by anticoagulation protocol: Yes   Plan:  -Increase heparin drip to 1800 units/hr -6 hr heparin level -Daily heparin level and CBC -Monitor for bleeding complications  [facility], Pharm.D.,  BCPS Clinical Pharmacist Pager: [phone] 04/06/2013 6:19 PM

## 2013-04-06 NOTE — Progress Notes (Signed)
    Subjective:  Denies CP or dyspnea   Objective:  Filed Vitals:   04/06/13 0600 04/06/13 0700 04/06/13 0800 04/06/13 0900  BP: 145/75 133/68 112/35 134/74  Pulse: 62 58 61 63  Temp:    97.5 F (36.4 C)  TempSrc:    Axillary  Resp: 21 21 19 17   Height:      Weight:      SpO2: 99% 96% 96% 94%    Intake/Output from previous day:  Intake/Output Summary (Last 24 hours) at 04/06/13 0945 Last data filed at 04/06/13 0900  Gross per 24 hour  Intake 2798.94 ml  Output    800 ml  Net 1998.94 ml    Physical Exam: Physical exam: Well-developed well-nourished in no acute distress.  Skin is warm and dry.  HEENT is normal.  Neck is supple.  Chest is clear to auscultation with normal expansion.  Cardiovascular exam is regular rate and rhythm.  Abdominal exam nontender or distended. No masses palpated. Right groin with no hematoma and no bruit Extremities show no edema. neuro grossly intact    Lab Results: Basic Metabolic Panel:  Recent Labs  04/05/13 1057 04/05/13 1104 04/06/13 0150  NA 134* 135* 133*  K 4.8 4.6 4.7  CL 98 97 97  CO2 25  --  23  GLUCOSE 338* 343* 214*  BUN 19 19 23   CREATININE 0.99 1.20 1.17  CALCIUM 9.0  --  8.9   CBC:  Recent Labs  04/05/13 1057 04/05/13 1104 04/05/13 1942 04/06/13 0150  WBC 13.7*  --   --  15.5*  NEUTROABS 9.6*  --   --   --   HGB 15.0 16.0  --  14.0  HCT 42.7 47.0  --  40.4  MCV 85.1  --   --  85.4  PLT 153  --  160 149*   Cardiac Enzymes:  Recent Labs  04/05/13 1505 04/05/13 1942 04/06/13 0150  TROPONINI >20.00* 12.82* 8.53*     Assessment/Plan:  1 acute inferior myocardial infarction-the patient is now status post PCI of his left circumflex. Continue aspirin, Plavix and statin. Intermittent AV block on telemetry; hold beta blocker for now. 2 history of recurrent DVT and pulmonary embolus-continue heparin. He does not want to take xarelto. Coumadin intitiated. Would continue aspirin, Plavix and Coumadin  for one month and then Coumadin and Plavix long-term.  3 diabetes mellitus-continue insulin and follow CBGs.  4 hyperlipidemia-statin resumed. I will use low dose as he has had an elevated CK previously. Repeat lipids, liver and CK in 4 weeks.  5 hypertension-resume ARB 6 ischemic cardiomyopathy-ejection fraction is 40% on ventriculogram.    Kirk Ruths 04/06/2013, 9:45 AM

## 2013-04-07 DIAGNOSIS — I4891 Unspecified atrial fibrillation: Secondary | ICD-10-CM

## 2013-04-07 DIAGNOSIS — I82409 Acute embolism and thrombosis of unspecified deep veins of unspecified lower extremity: Secondary | ICD-10-CM

## 2013-04-07 DIAGNOSIS — I2119 ST elevation (STEMI) myocardial infarction involving other coronary artery of inferior wall: Secondary | ICD-10-CM

## 2013-04-07 DIAGNOSIS — I480 Paroxysmal atrial fibrillation: Secondary | ICD-10-CM | POA: Diagnosis present

## 2013-04-07 DIAGNOSIS — I2510 Atherosclerotic heart disease of native coronary artery without angina pectoris: Secondary | ICD-10-CM

## 2013-04-07 LAB — GLUCOSE, CAPILLARY
Glucose-Capillary: 182 mg/dL — ABNORMAL HIGH (ref 70–99)
Glucose-Capillary: 284 mg/dL — ABNORMAL HIGH (ref 70–99)
Glucose-Capillary: 247 mg/dL — ABNORMAL HIGH (ref 70–99)
Glucose-Capillary: 246 mg/dL — ABNORMAL HIGH (ref 70–99)

## 2013-04-07 LAB — HEPARIN LEVEL (UNFRACTIONATED)
Heparin Unfractionated: 0.23 IU/mL — ABNORMAL LOW (ref 0.30–0.70)
Heparin Unfractionated: 0.22 IU/mL — ABNORMAL LOW (ref 0.30–0.70)
Heparin Unfractionated: 0.45 IU/mL (ref 0.30–0.70)

## 2013-04-07 LAB — CBC
HCT: 37.8 % — ABNORMAL LOW (ref 39.0–52.0)
Hemoglobin: 13.1 g/dL (ref 13.0–17.0)
MCH: 29.5 pg (ref 26.0–34.0)
MCHC: 34.7 g/dL (ref 30.0–36.0)
MCV: 85.1 fL (ref 78.0–100.0)
Platelets: 153 10*3/uL (ref 150–400)
RBC: 4.44 MIL/uL (ref 4.22–5.81)
RDW: 13.6 % (ref 11.5–15.5)
WBC: 11.8 10*3/uL — ABNORMAL HIGH (ref 4.0–10.5)

## 2013-04-07 LAB — PROTIME-INR
INR: 1.16 (ref 0.00–1.49)
Prothrombin Time: 14.6 seconds (ref 11.6–15.2)

## 2013-04-07 MED ORDER — WARFARIN SODIUM 10 MG PO TABS
10.0000 mg | ORAL_TABLET | Freq: Once | ORAL | Status: AC
  Administered 2013-04-07: 10 mg via ORAL
  Filled 2013-04-07: qty 1

## 2013-04-07 MED ORDER — ATORVASTATIN CALCIUM 40 MG PO TABS
40.0000 mg | ORAL_TABLET | Freq: Every day | ORAL | Status: DC
  Administered 2013-04-07 – 2013-04-10 (×4): 40 mg via ORAL
  Filled 2013-04-07 (×5): qty 1

## 2013-04-07 MED ORDER — FUROSEMIDE 10 MG/ML IJ SOLN
20.0000 mg | Freq: Two times a day (BID) | INTRAMUSCULAR | Status: DC
  Administered 2013-04-08: 20 mg via INTRAVENOUS
  Filled 2013-04-07 (×2): qty 2

## 2013-04-07 MED ORDER — CARVEDILOL 3.125 MG PO TABS
3.1250 mg | ORAL_TABLET | Freq: Two times a day (BID) | ORAL | Status: DC
  Administered 2013-04-08: 3.125 mg via ORAL
  Filled 2013-04-07 (×4): qty 1

## 2013-04-07 MED ORDER — FUROSEMIDE 10 MG/ML IJ SOLN
20.0000 mg | Freq: Two times a day (BID) | INTRAMUSCULAR | Status: DC
  Administered 2013-04-07: 20 mg via INTRAVENOUS

## 2013-04-07 MED ORDER — ESMOLOL HCL-SODIUM CHLORIDE 2000 MG/100ML IV SOLN: 25.0000 ug/kg/min | INTRAVENOUS | Status: DC

## 2013-04-07 MED ORDER — AMIODARONE HCL IN DEXTROSE 360-4.14 MG/200ML-% IV SOLN
60.0000 mg/h | INTRAVENOUS | Status: AC
  Administered 2013-04-07: 60 mg/h via INTRAVENOUS
  Filled 2013-04-07 (×2): qty 200

## 2013-04-07 MED ORDER — FUROSEMIDE 10 MG/ML IJ SOLN
INTRAMUSCULAR | Status: AC
  Filled 2013-04-07: qty 4

## 2013-04-07 MED ORDER — AMIODARONE HCL IN DEXTROSE 360-4.14 MG/200ML-% IV SOLN
30.0000 mg/h | INTRAVENOUS | Status: DC
  Administered 2013-04-07 – 2013-04-08 (×2): 30 mg/h via INTRAVENOUS
  Filled 2013-04-07 (×4): qty 200

## 2013-04-07 MED ORDER — ALPRAZOLAM 0.5 MG PO TABS
0.5000 mg | ORAL_TABLET | Freq: Every evening | ORAL | Status: DC | PRN
  Administered 2013-04-07 – 2013-04-10 (×4): 0.5 mg via ORAL
  Filled 2013-04-07 (×4): qty 1

## 2013-04-07 MED ORDER — SODIUM CHLORIDE 0.9 % IV SOLN
INTRAVENOUS | Status: DC
  Administered 2013-04-07: 12:00:00 via INTRAVENOUS

## 2013-04-07 MED ORDER — AMIODARONE LOAD VIA INFUSION
150.0000 mg | Freq: Once | INTRAVENOUS | Status: AC
  Administered 2013-04-07: 150 mg via INTRAVENOUS
  Filled 2013-04-07: qty 83.34

## 2013-04-07 MED FILL — Sodium Chloride IV Soln 0.9%: INTRAVENOUS | Qty: 50 | Status: AC

## 2013-04-07 NOTE — Progress Notes (Addendum)
CARDIAC REHAB PHASE I   PRE:  Rate/Rhythm: 73 afib    BP: sitting 133/67    SaO2: 97 RA  MODE:  Ambulation: 250 ft   POST:  Rate/Rhythm: 86 afib    BP: sitting 151/93, recheck 148/65     SaO2: 96 RA  Pt's HR controlled with ambulation. Pt slightly unsteady on his feet. Needed to stop and rest at approximately 200 ft for rest due to SOB. Pt sts this is normal for him with his regular activities.  Pt had to sleep in recliner x2 nights due to SOB. BP elevated after walk. Began ed, discussing MI, stent, antiplatelet, restrictions. Pt was not aware that he had had a previous MI. Needs continued reinforcement for learning. Gave pt diet sheets to read and will f/u to discuss further. Pt wants to walk more today. [id number]  Josephina Shih [place] CES, ACSM 04/07/2013 10:41 AM

## 2013-04-07 NOTE — Progress Notes (Signed)
Subjective:  Mr. Arnette 58 yo man pmh CABG (LIMA to LAD, SVG to OM, diagonal, PDA) 2002 at [facility], recurrent DVT and PE, DM, HTN, HLD p/w acute inferiorlateral MI 04/05/13.   LHC 04/05/13: Severe 3 vessel obstructive CAD, Patent LIMA to the LAD, Patent SVG to the OM, Occluded SVG to the diagonal,Occluded SVG to the PDA, Patent stents in the native RCA,Successful stenting of the mid to distal native LCx with a DES.   Marland Kitchen aspirin  81 mg Oral Daily  . atorvastatin  20 mg Oral q1800  . clopidogrel  75 mg Oral Q breakfast  . insulin aspart  20 Units Subcutaneous TID WC  . insulin glargine  40 Units Subcutaneous BID  . losartan  50 mg Oral Daily  . warfarin  10 mg Oral ONCE-1800  . Warfarin - Pharmacist Dosing Inpatient   Does not apply q1800   . sodium chloride Stopped (04/06/13 0500)  . heparin 2,100 Units/hr (04/07/13 0302)  . nitroGLYCERIN Stopped (04/05/13 1707)   Objective:   Filed Vitals:   04/07/13 0034 04/07/13 0155 04/07/13 0540 04/07/13 0741  BP:  125/74 110/75 140/75  Pulse: 60 55 49 75  Temp:   99.5 F (37.5 C) 98.7 F (37.1 C)  TempSrc:   Oral Oral  Resp: 18 19 21 17   Height:      Weight:      SpO2: 94% 98% 97% 97%   Intake/Output from previous day:  Intake/Output Summary (Last 24 hours) at 04/07/13 0749 Last data filed at 04/07/13 0600  Gross per 24 hour  Intake    617 ml  Output    300 ml  Net    317 ml    Physical Exam: Physical exam: Well-developed well-nourished in no acute distress.  Skin is warm and dry.  HEENT is normal.  Neck is supple.  Chest is clear to auscultation with normal expansion.  Cardiovascular exam is irregular irregular rate and rhythm.  Abdominal exam nontender or distended. No masses palpated. Right groin with no hematoma and no bruit Extremities show no edema. neuro grossly intact  Lab Results: Basic Metabolic Panel:  Recent Labs  04/05/13 1057 04/05/13 1104 04/06/13 0150  NA 134* 135* 133*  K 4.8 4.6 4.7  CL 98 97 97   CO2 25  --  23  GLUCOSE 338* 343* 214*  BUN 19 19 23   CREATININE 0.99 1.20 1.17  CALCIUM 9.0  --  8.9   CBC:  Recent Labs  04/05/13 1057  04/06/13 0150 04/07/13 0038  WBC 13.7*  --  15.5* 11.8*  NEUTROABS 9.6*  --   --   --   HGB 15.0  < > 14.0 13.1  HCT 42.7  < > 40.4 37.8*  MCV 85.1  --  85.4 85.1  PLT 153  < > 149* 153  < > = values in this interval not displayed. Cardiac Enzymes:  Recent Labs  04/05/13 1505 04/05/13 1942 04/06/13 0150  TROPONINI >20.00* 12.82* 8.53*   Cardiac Studies:   04/05/13 LHC: Left mainstem: Normal  Left anterior descending (LAD): The LAD is occluded in the mid vessel following a tiny second diagonal branch. The first diagonal branch is severely diseased proximally up to 90%. It then trifurcates into 3 small branches. The more apical branch is occluded.  Left circumflex (LCx): The LCx gives off one OM branch and then is occluded in the mid to distal vessel. The first OM is severely diseased proximally up to 90-95%. It  is supplied by a vein graft.  Right coronary artery (RCA): The RCA has stents in the proximal vessel and at the crux. These are both patent. The RCA has diffuse nonobstructive disease less than 30%. The RV marginal branch is occluded and fills by collaterals.  The SVG to the diagonal is occluded proximally  The SVG to the PDA is occluded proximally.  The SVG to the OM1 is patent. There is a 40-50% stenosis in the body of the SVG  The LIMA to the LAD is patent.  Left ventriculography: Left ventricular systolic function is abnormal, LVEF is estimated at 40%, there is severe hypokinesis of the inferior wall. There is no significant mitral regurgitation   Assessment/Plan:  # STEMI/ acute inferior myocardial infarction-the patient is now status post PCI of his left circumflex. Continue aspirin, Plavix and statin. Intermittent AV block on telemetry; hold beta blocker for now.  #Afib/flutter: new onset this AM. Pt is on anticoagulation.  INR not therapeutic this AM.   # History of recurrent DVT and pulmonary embolus-continue heparin. He does not want to take xarelto. Coumadin intitiated. Would continue aspirin, Plavix and Coumadin for one month and then Coumadin and Plavix long-term.   #diabetes mellitus-continue insulin and follow CBGs.   # hyperlipidemia-statin resumed. I will use low dose as he has had an elevated CK previously. Repeat lipids, liver and CK in 4 weeks.   # hypertension-resume ARB  # ischemic cardiomyopathy-ejection fraction is 40% on ventriculogram.   Clinton Gallant 04/07/2013, 7:49 AM   The patient was seen, examined and discussed with Clinton Gallant, MD, IM resident and I agree with the above.   In summary, Mr Corp is a 58 year old male with h/o DM, HLP, recurrent DVT on Coumadine, CAD, s/p CABG who was admitted with inferior STEMI, s/p DES to RCA on 04/06/2013. He has residual ST elevations on ECG and now he developed a-fib with rate control. We will start amiodarone drip, order an echocardiogram to evaluate for regional wall motion.  Continue ASA, Plavix, we will increase atorvastatin to 40 mg po daily and add carvedilol 3.125 mg po daily.  Add low dose lasix 20 mg iv Q12 H for fluid overload.   Ena Dawley, Lemmie Evens 04/07/2013

## 2013-04-07 NOTE — Progress Notes (Signed)
[place] for heparin/Coumadin Indication: STEMI / hx DVT  No Known Allergies  Patient Measurements: Height: 6' (182.9 cm) Weight: 264 lb 1.8 oz (119.8 kg) IBW/kg (Calculated) : 77.6 Heparin Dosing Weight: 104 kg  Vital Signs: Temp: 98.6 F (37 C) (02/23 1137) Temp src: Oral (02/23 1137) BP: 113/63 mmHg (02/23 1137) Pulse Rate: 73 (02/23 1137)  Labs:  Recent Labs  04/05/13 1057 04/05/13 1104 04/05/13 1505 04/05/13 1942 04/06/13 0150  04/06/13 1653 04/07/13 0038 04/07/13 1036  HGB 15.0 16.0  --   --  14.0  --   --  13.1  --   HCT 42.7 47.0  --   --  40.4  --   --  37.8*  --   PLT 153  --   --  160 149*  --   --  153  --   APTT 28  --   --   --   --   --   --   --   --   LABPROT 15.5*  --   --   --  15.0  --   --  14.6  --   INR 1.26  --   --   --  1.21  --   --  1.16  --   HEPARINUNFRC  --   --   --   --  0.35  < > 0.26* 0.23* 0.22*  CREATININE 0.99 1.20  --   --  1.17  --   --   --   --   TROPONINI  --   --  >20.00* 12.82* 8.53*  --   --   --   --   < > = values in this interval not displayed.  Estimated Creatinine Clearance: 92 ml/min (by C-G formula based on Cr of 1.17).  Assessment: Mr. Karriker is a 58 yo M with PMH of CAD s/p CABG, prior recurrent DVT/PE, DM, HTN, and HLD who presented to the ED 2/21 with acute STEMI. He received 324 mg aspirin and a 4000 unit heparin bolus in the ED and then taken emergently to cath where he underwent PCI with DES to circumflex. During PCI, he received Angiomax as well as Aggrastat. Aggrastat continued for 12 hours s/p cath.   Heparin level remains subtherapeutic at 0.21 after two rate increases, most recently on 2100 units/hr. Per nurse, no issues with lines and no noted s/s bleeding.   Goal of Therapy:  Heparin level 0.3 - 0.7 Monitor platelets by anticoagulation protocol: Yes   Plan:  - Increase Heparin 2500 units/hr (~4 units/kg/hr) - Check heparin level in 6 hours - Coumadin 10 mg  PO x 1 tonight - Continue to monitor daily INR, HL, CBC, s/s bleeding  Harolyn [name], PharmD Clinical Pharmacist - Resident Pager: [phone] Pharmacy: [phone] 04/07/2013 11:54 AM

## 2013-04-07 NOTE — Progress Notes (Signed)
Inpatient Diabetes Program Recommendations  AACE/ADA: New Consensus Statement on Inpatient Glycemic Control (2013)  Target Ranges:  Prepandial:   less than 140 mg/dL      Peak postprandial:   less than 180 mg/dL (1-2 hours)      Critically ill patients:  140 - 180 mg/dL   Inpatient Diabetes Program Recommendations Correction (SSI): consider adding Novolog sensitive scale TID + HS per Glycemic Control order set Thank you  Raoul Pitch BSN, RN,CDE Inpatient Diabetes Coordinator [phone] (team pager)

## 2013-04-07 NOTE — Care Management Note (Addendum)
    Page 1 of 2   04/10/2013     11:32:00 AM   CARE MANAGEMENT NOTE 04/10/2013  Patient:  Bobby Lindsey, Bobby Lindsey   Account Number:  [id number]  Date Initiated:  04/07/2013  Documentation initiated by:  Elissa Hefty  Subjective/Objective Assessment:   adm w mi     Action/Plan:   lives w wife, pcp dr Eustace Moore Caryl Comes   Anticipated DC Date:     Anticipated DC Plan:  [place]  Follow-up appt scheduled  CM consult      Choice offered to / List presented to:     DME arranged  CPAP      DME agency  [place].        Status of service:   Medicare Important Message given?   (If response is "NO", the following Medicare IM given date fields will be blank) Date Medicare IM given:   Date Additional Medicare IM given:    Discharge Disposition:  HOME/SELF CARE  Per UR Regulation:  Reviewed for med. necessity/level of care/duration of stay  If discussed at [place] of Stay Meetings, dates discussed:   04/10/2013    Comments:  2/26  1130 debbie Aydian Dimmick rn,bsn spoke w [facility] sleep lab answering service-lab closed today due to weather. unable to find date for sleep study.[facility] working on home cpap.  2/25 1058a debbie Ajani Rineer rn,bsn md states will need home cpap. [facility] rep states they have Psychologist, educational. needs appt for outpt sleep study and they will work on home cpap. have faxed [facility]pt sleep form to [facility]. pt prefers [facility] since closer to his home.have faxed sleep ref form for date and time sched for [facility].

## 2013-04-07 NOTE — Progress Notes (Signed)
[place] for heparin/Coumadin Indication: STEMI / hx DVT  No Known Allergies  Patient Measurements: Height: 6' (182.9 cm) Weight: 264 lb 1.8 oz (119.8 kg) IBW/kg (Calculated) : 77.6 Heparin Dosing Weight: 104 kg  Vital Signs: Temp: 98.8 F (37.1 C) (02/23 1618) Temp src: Oral (02/23 1618) BP: 150/89 mmHg (02/23 1800) Pulse Rate: 57 (02/23 1618)  Labs:  Recent Labs  04/05/13 1057 04/05/13 1104 04/05/13 1505 04/05/13 1942 04/06/13 0150  04/07/13 0038 04/07/13 1036 04/07/13 1825  HGB 15.0 16.0  --   --  14.0  --  13.1  --   --   HCT 42.7 47.0  --   --  40.4  --  37.8*  --   --   PLT 153  --   --  160 149*  --  153  --   --   APTT 28  --   --   --   --   --   --   --   --   LABPROT 15.5*  --   --   --  15.0  --  14.6  --   --   INR 1.26  --   --   --  1.21  --  1.16  --   --   HEPARINUNFRC  --   --   --   --  0.35  < > 0.23* 0.22* 0.45  CREATININE 0.99 1.20  --   --  1.17  --   --   --   --   TROPONINI  --   --  >20.00* 12.82* 8.53*  --   --   --   --   < > = values in this interval not displayed.  Estimated Creatinine Clearance: 92 ml/min (by C-G formula based on Cr of 1.17).  Assessment: Mr. Kalbaugh is a 58 yo M with PMH of CAD s/p CABG, prior recurrent DVT/PE, DM, HTN, and HLD who presented to the ED 2/21 with acute STEMI. He received 324 mg aspirin and a 4000 unit heparin bolus in the ED and then taken emergently to cath where he underwent PCI with DES to circumflex. During PCI, he received Angiomax as well as Aggrastat. Aggrastat continued for 12 hours s/p cath.   Heparin level is now therapeutic at 0.45 without noted bleeding complications.     Goal of Therapy:  Heparin level 0.3 - 0.7 Monitor platelets by anticoagulation protocol: Yes   Plan:  - Will continue current Heparin at 2500 units/hr - Check heparin level in the morning and adjust as needed - Coumadin 10 mg PO x 1 tonight - Continue to monitor daily INR, HL, CBC,  s/s bleeding  Rober Minion, PharmD., MS Clinical Pharmacist Pager:  [phone] Thank you for allowing pharmacy to be part of this patients care team. 04/07/2013 7:25 PM

## 2013-04-07 NOTE — Progress Notes (Addendum)
[place] for heparin/Coumadin Indication: STEMI / hx DVT  No Known Allergies  Patient Measurements: Height: 6' (182.9 cm) Weight: 264 lb 1.8 oz (119.8 kg) IBW/kg (Calculated) : 77.6 Heparin Dosing Weight: 104 kg  Vital Signs: Temp: 99.4 F (37.4 C) (02/22 1931) Temp src: Oral (02/22 1931) BP: 120/65 mmHg (02/22 1931) Pulse Rate: 64 (02/23 0000)  Labs:  Recent Labs  04/05/13 1057 04/05/13 1104 04/05/13 1505 04/05/13 1942  04/06/13 0150 04/06/13 0934 04/06/13 1653 04/07/13 0038  HGB 15.0 16.0  --   --   --  14.0  --   --  13.1  HCT 42.7 47.0  --   --   --  40.4  --   --  37.8*  PLT 153  --   --  160  --  149*  --   --  153  APTT 28  --   --   --   --   --   --   --   --   LABPROT 15.5*  --   --   --   --  15.0  --   --  14.6  INR 1.26  --   --   --   --  1.21  --   --  1.16  HEPARINUNFRC  --   --   --   --   < > 0.35 <0.10* 0.26* 0.23*  CREATININE 0.99 1.20  --   --   --  1.17  --   --   --   TROPONINI  --   --  >20.00* 12.82*  --  8.53*  --   --   --   < > = values in this interval not displayed.  Estimated Creatinine Clearance: 92 ml/min (by C-G formula based on Cr of 1.17).  Assessment: 58 yo Male with h/o DVT/PE, s/p STEMI/PCI, for heparin  Goal of Therapy:  Heparin level 0.3 - 0.7 Monitor platelets by anticoagulation protocol: Yes   Plan:  Increase Heparin 2100 units/hr Check heparin level in 8 hours. Coumadin 10 mg today  Phillis Knack, PharmD, BCPS  04/07/2013 2:46 AM

## 2013-04-08 DIAGNOSIS — I517 Cardiomegaly: Secondary | ICD-10-CM

## 2013-04-08 DIAGNOSIS — I509 Heart failure, unspecified: Secondary | ICD-10-CM | POA: Diagnosis present

## 2013-04-08 DIAGNOSIS — I5023 Acute on chronic systolic (congestive) heart failure: Secondary | ICD-10-CM | POA: Diagnosis present

## 2013-04-08 LAB — BASIC METABOLIC PANEL
BUN: 27 mg/dL — ABNORMAL HIGH (ref 6–23)
CO2: 24 mEq/L (ref 19–32)
Calcium: 8.8 mg/dL (ref 8.4–10.5)
Chloride: 95 mEq/L — ABNORMAL LOW (ref 96–112)
Creatinine, Ser: 1.08 mg/dL (ref 0.50–1.35)
GFR calc Af Amer: 86 mL/min — ABNORMAL LOW (ref >90)
GFR calc non Af Amer: 74 mL/min — ABNORMAL LOW (ref >90)
Glucose, Bld: 341 mg/dL — ABNORMAL HIGH (ref 70–99)
Potassium: 4.1 mEq/L (ref 3.7–5.3)
Sodium: 133 mEq/L — ABNORMAL LOW (ref 137–147)

## 2013-04-08 LAB — CBC
HCT: 37.7 % — ABNORMAL LOW (ref 39.0–52.0)
Hemoglobin: 13.3 g/dL (ref 13.0–17.0)
MCH: 29.6 pg (ref 26.0–34.0)
MCHC: 35.3 g/dL (ref 30.0–36.0)
MCV: 83.8 fL (ref 78.0–100.0)
Platelets: 184 10*3/uL (ref 150–400)
RBC: 4.5 MIL/uL (ref 4.22–5.81)
RDW: 13.5 % (ref 11.5–15.5)
WBC: 10.5 10*3/uL (ref 4.0–10.5)

## 2013-04-08 LAB — POCT ACTIVATED CLOTTING TIME
Activated Clotting Time: 149 seconds
Activated Clotting Time: 537 seconds

## 2013-04-08 LAB — HEPARIN LEVEL (UNFRACTIONATED): Heparin Unfractionated: 0.4 IU/mL (ref 0.30–0.70)

## 2013-04-08 LAB — PROTIME-INR
INR: 1.23 (ref 0.00–1.49)
Prothrombin Time: 15.2 seconds (ref 11.6–15.2)

## 2013-04-08 LAB — GLUCOSE, CAPILLARY
Glucose-Capillary: 253 mg/dL — ABNORMAL HIGH (ref 70–99)
Glucose-Capillary: 223 mg/dL — ABNORMAL HIGH (ref 70–99)
Glucose-Capillary: 204 mg/dL — ABNORMAL HIGH (ref 70–99)
Glucose-Capillary: 280 mg/dL — ABNORMAL HIGH (ref 70–99)

## 2013-04-08 LAB — MAGNESIUM: Magnesium: 2 mg/dL (ref 1.5–2.5)

## 2013-04-08 MED ORDER — INSULIN ASPART 100 UNIT/ML ~~LOC~~ SOLN
0.0000 [IU] | Freq: Every day | SUBCUTANEOUS | Status: DC
  Administered 2013-04-08: 3 [IU] via SUBCUTANEOUS
  Administered 2013-04-10: 2 [IU] via SUBCUTANEOUS

## 2013-04-08 MED ORDER — CARVEDILOL 3.125 MG PO TABS
3.1250 mg | ORAL_TABLET | Freq: Two times a day (BID) | ORAL | Status: DC
  Filled 2013-04-08 (×2): qty 1

## 2013-04-08 MED ORDER — FUROSEMIDE 10 MG/ML IJ SOLN
40.0000 mg | Freq: Two times a day (BID) | INTRAMUSCULAR | Status: DC
  Administered 2013-04-08 – 2013-04-09 (×2): 40 mg via INTRAVENOUS
  Filled 2013-04-08 (×3): qty 4

## 2013-04-08 MED ORDER — AMIODARONE HCL 200 MG PO TABS
400.0000 mg | ORAL_TABLET | Freq: Two times a day (BID) | ORAL | Status: AC
  Administered 2013-04-08 – 2013-04-10 (×6): 400 mg via ORAL
  Filled 2013-04-08 (×7): qty 2

## 2013-04-08 MED ORDER — INSULIN ASPART 100 UNIT/ML ~~LOC~~ SOLN
0.0000 [IU] | Freq: Three times a day (TID) | SUBCUTANEOUS | Status: DC
  Administered 2013-04-08 (×2): 3 [IU] via SUBCUTANEOUS
  Administered 2013-04-08: 5 [IU] via SUBCUTANEOUS
  Administered 2013-04-09: 2 [IU] via SUBCUTANEOUS
  Administered 2013-04-09: 3 [IU] via SUBCUTANEOUS
  Administered 2013-04-09 – 2013-04-10 (×2): 1 [IU] via SUBCUTANEOUS
  Administered 2013-04-10 – 2013-04-11 (×3): 2 [IU] via SUBCUTANEOUS
  Administered 2013-04-11: 3 [IU] via SUBCUTANEOUS

## 2013-04-08 MED ORDER — WARFARIN SODIUM 10 MG PO TABS
10.0000 mg | ORAL_TABLET | Freq: Once | ORAL | Status: AC
  Administered 2013-04-08: 10 mg via ORAL
  Filled 2013-04-08: qty 1

## 2013-04-08 MED ORDER — FUROSEMIDE 10 MG/ML IJ SOLN
INTRAMUSCULAR | Status: AC
  Filled 2013-04-08: qty 4

## 2013-04-08 MED ORDER — CARVEDILOL 3.125 MG PO TABS
3.1250 mg | ORAL_TABLET | Freq: Two times a day (BID) | ORAL | Status: DC
  Administered 2013-04-08 – 2013-04-11 (×6): 3.125 mg via ORAL
  Filled 2013-04-08 (×8): qty 1

## 2013-04-08 NOTE — Progress Notes (Signed)
  Echocardiogram 2D Echocardiogram has been performed.  Bobby Lindsey 04/08/2013, 10:14 AM

## 2013-04-08 NOTE — Progress Notes (Signed)
Pt appears to have converted to himself into NSR around 230am 04/08/13;

## 2013-04-08 NOTE — Progress Notes (Signed)
Pt.'s CPAP was set up at the bedside. Pt. States that he will have RN call when he is ready for his CPAP.

## 2013-04-08 NOTE — Progress Notes (Addendum)
[place] for heparin/Coumadin Indication: STEMI / hx DVT  No Known Allergies  Patient Measurements: Height: 6' (182.9 cm) Weight: 264 lb 1.8 oz (119.8 kg) IBW/kg (Calculated) : 77.6 Heparin Dosing Weight: 104 kg  Vital Signs: Temp: 98.5 F (36.9 C) (02/24 0808) Temp src: Oral (02/24 0808) BP: 136/72 mmHg (02/24 0808) Pulse Rate: 64 (02/24 0808)  Labs:  Recent Labs  04/05/13 1057 04/05/13 1104 04/05/13 1505 04/05/13 1942 04/06/13 0150  04/07/13 0038 04/07/13 1036 04/07/13 1825 04/08/13 0305  HGB 15.0 16.0  --   --  14.0  --  13.1  --   --  13.3  HCT 42.7 47.0  --   --  40.4  --  37.8*  --   --  37.7*  PLT 153  --   --  160 149*  --  153  --   --  184  APTT 28  --   --   --   --   --   --   --   --   --   LABPROT 15.5*  --   --   --  15.0  --  14.6  --   --  15.2  INR 1.26  --   --   --  1.21  --  1.16  --   --  1.23  HEPARINUNFRC  --   --   --   --  0.35  < > 0.23* 0.22* 0.45 0.40  CREATININE 0.99 1.20  --   --  1.17  --   --   --   --   --   TROPONINI  --   --  >20.00* 12.82* 8.53*  --   --   --   --   --   < > = values in this interval not displayed.  Estimated Creatinine Clearance: 92 ml/min (by C-G formula based on Cr of 1.17).  Assessment: Bobby Lindsey is a 58 yo M with PMH of CAD s/p CABG, prior recurrent DVT/PE, DM, HTN, and HLD who presented to the ED 2/21 with acute STEMI. He received 324 mg aspirin and a 4000 unit heparin bolus in the ED and then taken emergently to cath where he underwent PCI with DES to circumflex. During PCI, he received Angiomax as well as Aggrastat. Aggrastat continued for 12 hours s/p cath.  Pharmacy has been consulted to dose coumadin with a heparin bridge.   Heparin level remains therapeutic at 0.40 without noted bleeding complications.   INR remains subtherapeutic this morning at 1.23. INR has been slow to increase during his admission.  Per review of past notes, it appears when he was previously  on warfarin, his dose was 10 mg daily. He was changed to xarelto on January 19th, though had poor compliance with therapy.  Goal of Therapy:  Heparin level 0.3 - 0.7 Monitor platelets by anticoagulation protocol: Yes   Plan:  - Will continue current Heparin at 2500 units/hr - Coumadin 10 mg PO x 1 tonight - Continue to monitor daily INR, HL, CBC, s/s bleeding  Bobby Lindsey, PharmD Clinical Pharmacist-Resident Pager: [phone] Pharmacy: [phone] 04/08/2013 9:13 AM

## 2013-04-08 NOTE — Progress Notes (Signed)
CARDIAC REHAB PHASE I   PRE:  Rate/Rhythm: 60SR  BP:  Supine: 125/57  Sitting:   Standing:    SaO2: 95%RA  MODE:  Ambulation: 700 ft   POST:  Rate/Rhythm: 67SR  BP:  Supine:   Sitting: 145/80  Standing:    SaO2: 98-99%RA 1100-1147 Pt fast asleep, had to call name several times to wake up. MD in to see pt. Pt walked 700 ft with steady gait. States he has neuropathy in legs and feet. Denied CP but stated he was slightly SOB which he said is normal for him. Denied dizziness. Tolerated well. To recliner. Was going to do more education but visitors came in and pt seemed happy to see them and visit. Will follow up tomorrow for ed.   Graylon Good, RN BSN  04/08/2013 11:44 AM

## 2013-04-08 NOTE — Progress Notes (Addendum)
Subjective: Denies chest pain, sob.  He does not wear cpap at home.  Feeling sleepy this am sleeping in recliner.  He only wants to wear cpap if its nasal mask  Tele: several missed beats and pauses, brady to 57   RN: Xanax helped patient last night otherwise no events.   Objective: Vital signs in last 24 hours: Filed Vitals:   04/07/13 1800 04/07/13 2000 04/07/13 2346 04/08/13 0323  BP: 150/89 124/63 123/71 114/61  Pulse:   59 60  Temp:  98.7 F (37.1 C) 98.2 F (36.8 C) 98.9 F (37.2 C)  TempSrc:  Oral Oral Oral  Resp:   18 16  Height:      Weight:      SpO2: 98% 97% 96% 97%   Weight change:   Intake/Output Summary (Last 24 hours) at 04/08/13 0735 Last data filed at 04/08/13 0600  Gross per 24 hour  Intake 1972.7 ml  Output   1701 ml  Net  271.7 ml   Vitals reviewed. General: resting in recliner, arousable NAD HEENT: [place]/at, no scleral icterus Cardiac: AF rate controlled, no rubs, murmurs or gallops Pulm: crackles on exam (2nd exam by Dr. Meda Coffee) Abd: obese, soft, nontender, nondistended, BS present Ext: cool, 1-2+ pedal edema b/l lower ext Neuro: alert and oriented X3, cranial nerves II-XII grossly intact, moving all 4 ext Lab Results: Basic Metabolic Panel:  Recent Labs Lab 04/05/13 1057 04/05/13 1104 04/06/13 0150  NA 134* 135* 133*  K 4.8 4.6 4.7  CL 98 97 97  CO2 25  --  23  GLUCOSE 338* 343* 214*  BUN 19 19 23   CREATININE 0.99 1.20 1.17  CALCIUM 9.0  --  8.9   CBC:  Recent Labs Lab 04/05/13 1057  04/07/13 0038 04/08/13 0305  WBC 13.7*  < > 11.8* 10.5  NEUTROABS 9.6*  --   --   --   HGB 15.0  < > 13.1 13.3  HCT 42.7  < > 37.8* 37.7*  MCV 85.1  < > 85.1 83.8  PLT 153  < > 153 184  < > = values in this interval not displayed. Cardiac Enzymes:  Recent Labs Lab 04/05/13 1505 04/05/13 1942 04/06/13 0150  TROPONINI >20.00* 12.82* 8.53*   CBG:  Recent Labs Lab 04/06/13 1729 04/06/13 2047 04/07/13 0746 04/07/13 1142  04/07/13 1622 04/07/13 1705  GLUCAP 240* 141* 182* 284* 247* 246*   Hemoglobin A1C:  Recent Labs Lab 04/06/13 0150  HGBA1C 10.7*   Coagulation:  Recent Labs Lab 04/05/13 1057 04/06/13 0150 04/07/13 0038 04/08/13 0305  LABPROT 15.5* 15.0 14.6 15.2  INR 1.26 1.21 1.16 1.23   Misc. Labs: Pension scheme manager Results: Recent Results (from the past 240 hour(s))  MRSA PCR SCREENING     Status: None   Collection Time    04/05/13  1:32 PM      Result Value Ref Range Status   MRSA by PCR NEGATIVE  NEGATIVE Final   Comment:            The GeneXpert MRSA Assay (FDA     approved for NASAL specimens     only), is one component of a     comprehensive MRSA colonization     surveillance program. It is not     intended to diagnose MRSA     infection nor to guide or     monitor treatment for     MRSA infections.   Studies/Results: No results found.  Medications: Prescriptions prior to admission  Medication Sig Dispense Refill  . Calcium 500 MG CHEW Chew 500 mg by mouth daily.      Marland Kitchen ibuprofen (ADVIL,MOTRIN) 400 MG tablet Take 400 mg by mouth every 6 (six) hours as needed for headache or mild pain.      Marland Kitchen insulin glargine (LANTUS) 100 UNIT/ML injection Inject 40 Units into the skin 2 (two) times daily.       . insulin lispro (HUMALOG KWIKPEN) 100 UNIT/ML injection Inject 20 Units into the skin 3 (three) times daily before meals. UAD      . losartan (COZAAR) 50 MG tablet Take 1 tablet (50 mg total) by mouth daily.  30 tablet  11  . metoprolol succinate (TOPROL-XL) 25 MG 24 hr tablet Take 1 tablet (25 mg total) by mouth daily.  30 tablet  11  . nitroGLYCERIN (NITROSTAT) 0.4 MG SL tablet Place 1 tablet (0.4 mg total) under the tongue every 5 (five) minutes as needed.  25 tablet  6  . Rivaroxaban (XARELTO) 20 MG TABS tablet Take 1 tablet (20 mg total) by mouth daily with supper.  30 tablet  11   Scheduled Meds: . aspirin  81 mg Oral Daily  . atorvastatin  40 mg Oral q1800  .  carvedilol  3.125 mg Oral BID WC  . clopidogrel  75 mg Oral Q breakfast  . furosemide      . furosemide  20 mg Intravenous Q12H  . insulin aspart  20 Units Subcutaneous TID WC  . insulin glargine  40 Units Subcutaneous BID  . losartan  50 mg Oral Daily  . Warfarin - Pharmacist Dosing Inpatient   Does not apply q1800   Continuous Infusions: . sodium chloride Stopped (04/06/13 0500)  . sodium chloride 10 mL/hr at 04/07/13 1145  . amiodarone (NEXTERONE PREMIX) 360 mg/200 mL dextrose 30 mg/hr (04/08/13 0200)  . heparin 2,500 Units/hr (04/08/13 0200)  . nitroGLYCERIN Stopped (04/05/13 1707)   PRN Meds:.acetaminophen, ALPRAZolam, morphine injection, nitroGLYCERIN, ondansetron (ZOFRAN) IV  2/21 cath  Diagnostic Procedure Details: The right groin was prepped, draped, and anesthetized with 1% lidocaine. Using the modified Seldinger technique, a 6 French sheath was introduced into the right femoral artery. Standard Judkins catheters were used for selective coronary angiography and left ventriculography. Catheter exchanges were performed over a wire. The diagnostic procedure was well-tolerated without immediate complications.  PROCEDURAL FINDINGS  Hemodynamics:  AO 137/68 mean 93 mm Hg  LV 139/31 mm Hg  Coronary angiography:  Coronary dominance: right  Left mainstem: Normal  Left anterior descending (LAD): The LAD is occluded in the mid vessel following a tiny second diagonal branch. The first diagonal branch is severely diseased proximally up to 90%. It then trifurcates into 3 small branches. The more apical branch is occluded.  Left circumflex (LCx): The LCx gives off one OM branch and then is occluded in the mid to distal vessel. The first OM is severely diseased proximally up to 90-95%. It is supplied by a vein graft.  Right coronary artery (RCA): The RCA has stents in the proximal vessel and at the crux. These are both patent. The RCA has diffuse nonobstructive disease less than 30%. The RV  marginal branch is occluded and fills by collaterals.  The SVG to the diagonal is occluded proximally  The SVG to the PDA is occluded proximally.  The SVG to the OM1 is patent. There is a 40-50% stenosis in the body of the SVG  The LIMA to  the LAD is patent.  Left ventriculography: Left ventricular systolic function is abnormal, LVEF is estimated at 40%, there is severe hypokinesis of the inferior wall. There is no significant mitral regurgitation  PCI Procedure Note: Following the diagnostic procedure, the decision was made to proceed with PCI of the distal LCx. Weight-based bivalirudin was given for anticoagulation. Plavix 600 mg was given orally. Once a therapeutic ACT was achieved, a 6 [place] XBLAD 4 guide catheter was inserted. A prowater coronary guidewire was used to cross the lesion. The lesion was predilated with a 2.0 mm balloon. With reperfusion there was a long lesion in the vessel. Distally the LCx gives off 3 small PLOM branches. The lesion was then stented with a 2.5 x 28 mm Promus stent. The stent was postdilated with a 2.5 mm noncompliant balloon. Following PCI, there was 0% residual stenosis and TIMI-3 flow. Final angiography confirmed an excellent result. Femoral hemostasis was achieved with an Angioseal device. At the end of procedure the patient received a single bolus of Aggrastat followed by infusion and Bivalirudin was discontinued. The patient tolerated the PCI procedure well. There were no immediate procedural complications. The patient was transferred to the post catheterization recovery area for further monitoring. 255 cc on contrast was used.  PCI Data:  Vessel - LCx/Segment - mid to distal  Percent Stenosis (pre) 100%  TIMI-flow 0  Stent 2.5 x 28 mm Promus  Percent Stenosis (post) 0%  TIMI-flow (post) 3  Final Conclusions:  1. Severe 3 vessel obstructive CAD  2. Patent LIMA to the LAD  3. Patent SVG to the OM  4. Occluded SVG to the diagonal  5. Occluded SVG to the  PDA  6. Patent stents in the native RCA  7. Moderate LV dysfunction  8. Successful stenting of the mid to distal native LCx with a DES. Given the small caliber of vessel and length of the lesion I did not feel a BMS was appropriate.  Recommendations: Continue dual antiplatelet therapy with ASA and Plavix for one month then Plavix indefinitely. Will need to resume anticoagulant therapy with either coumadin or Xarelto as well. Will continue Aggrastat for 12 hours and start IV heparin in 8 hours. Patient transferred to the ICU for further management.   Assessment/Plan:  58 y.o PMH CAD, HLD, HTN, DM, peripheral neuropathy, PE, DVT, MI post CABG (2002 [facility]), COPD, PVD, OSA, GERD presented 2/21 for [place] pain x 2 days associated with sob, nausea, radiation to left arm.     #ST elevation myocardial infarction (STEMI) of inferolateral wall -CAD/MI s/p CABG 2002, s/p LCx DES stent with noted severe 3 vessel CAD on cath 2/21  -Heparin gtt, Asprin 81 mg, Lipitor 40, Coreg 3.125 gm bid, Cozaar 50 mg qd, Plavix 75, morphine 2 mg q1 prn, prn SL NTG -pending 2 d echo.   -will d/c coreg pt bradycardic to 40s today  -1-2+ edema lower extremities on Lasix 20 mg bid will increase to 40 mg bid   #Atrial fibrillation -Heparin gtt, Coumadin, Amiodarone gtt. HR controlled as of 11:15 pt in NSR -follow INR -will start oral Amio 400 mg bid x 3 days, 400 mg qd x 7 days then 200 mg qd   #Bradycardia -consider d/c coreg if HR cant tolerate  -Monitor tele   #DM 2 (HA1C 10.7) -monitor cbgs  -Lantus 40 units bid, SSI-S and qhs  #HTN -Monitor vitals. BP as high as 150/89 in 24 hours now 114/61  #Hx PE -was on Xarelto now  on Coumadin  #COPD Hx  -smoking cessation   #OSA -likely needs to wear cpap with pauses on tele.  -Pt only wants nasal mask does not have one at home and wants one for home use. Please order at discharge -will only wear nasal mask which we dont have here   #F/E/N -NSL -pending BMET,  mag -carb mod diet   #DVT px  -Heparin gtt, Coumadin    LOS: 3 days   Cresenciano Genre, MD [phone] 04/08/2013, 7:35 AM  The patient was seen, examined and discussed with Cresenciano Genre, MD, IM resident and I agree with the above.   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 He has residual ST elevations on ECG and now he developed a-fib with rate control.  We started amiodarone drip yesterday and he cardioverted to SR this am. We will switch amiodarone to PO. Continue coreq 3.125 mg po BID. Echocardiogram shows preserved LVEF, no regional WMA, but elevated filling pressures. We will continue increase Lasix to 40 mg iv Q12 H for fluid overload.   Ena Dawley, Lemmie Evens  04/08/2013

## 2013-04-09 DIAGNOSIS — I5033 Acute on chronic diastolic (congestive) heart failure: Secondary | ICD-10-CM

## 2013-04-09 DIAGNOSIS — I509 Heart failure, unspecified: Secondary | ICD-10-CM

## 2013-04-09 LAB — CBC
HCT: 37.3 % — ABNORMAL LOW (ref 39.0–52.0)
Hemoglobin: 13.1 g/dL (ref 13.0–17.0)
MCH: 29.4 pg (ref 26.0–34.0)
MCHC: 35.1 g/dL (ref 30.0–36.0)
MCV: 83.8 fL (ref 78.0–100.0)
Platelets: 204 10*3/uL (ref 150–400)
RBC: 4.45 MIL/uL (ref 4.22–5.81)
RDW: 13.5 % (ref 11.5–15.5)
WBC: 9.2 10*3/uL (ref 4.0–10.5)

## 2013-04-09 LAB — GLUCOSE, CAPILLARY
Glucose-Capillary: 230 mg/dL — ABNORMAL HIGH (ref 70–99)
Glucose-Capillary: 157 mg/dL — ABNORMAL HIGH (ref 70–99)
Glucose-Capillary: 188 mg/dL — ABNORMAL HIGH (ref 70–99)
Glucose-Capillary: 190 mg/dL — ABNORMAL HIGH (ref 70–99)

## 2013-04-09 LAB — MAGNESIUM: Magnesium: 2.1 mg/dL (ref 1.5–2.5)

## 2013-04-09 LAB — BASIC METABOLIC PANEL
BUN: 26 mg/dL — ABNORMAL HIGH (ref 6–23)
CO2: 23 mEq/L (ref 19–32)
Calcium: 8.3 mg/dL — ABNORMAL LOW (ref 8.4–10.5)
Chloride: 97 mEq/L (ref 96–112)
Creatinine, Ser: 1.09 mg/dL (ref 0.50–1.35)
GFR calc Af Amer: 85 mL/min — ABNORMAL LOW (ref >90)
GFR calc non Af Amer: 73 mL/min — ABNORMAL LOW (ref >90)
Glucose, Bld: 248 mg/dL — ABNORMAL HIGH (ref 70–99)
Potassium: 3.8 mEq/L (ref 3.7–5.3)
Sodium: 134 mEq/L — ABNORMAL LOW (ref 137–147)

## 2013-04-09 LAB — PROTIME-INR
INR: 1.47 (ref 0.00–1.49)
Prothrombin Time: 17.4 seconds — ABNORMAL HIGH (ref 11.6–15.2)

## 2013-04-09 LAB — HEPARIN LEVEL (UNFRACTIONATED)
Heparin Unfractionated: 0.27 IU/mL — ABNORMAL LOW (ref 0.30–0.70)
Heparin Unfractionated: 0.52 IU/mL (ref 0.30–0.70)

## 2013-04-09 MED ORDER — FUROSEMIDE 40 MG PO TABS: 40.0000 mg | ORAL_TABLET | Freq: Two times a day (BID) | ORAL | Status: DC

## 2013-04-09 MED ORDER — SODIUM CHLORIDE 0.9 % IV SOLN: INTRAVENOUS | Status: DC

## 2013-04-09 MED ORDER — WARFARIN SODIUM 7.5 MG PO TABS
15.0000 mg | ORAL_TABLET | Freq: Once | ORAL | Status: AC
  Administered 2013-04-09: 15 mg via ORAL
  Filled 2013-04-09: qty 2

## 2013-04-09 MED ORDER — FUROSEMIDE 10 MG/ML IJ SOLN
80.0000 mg | Freq: Two times a day (BID) | INTRAMUSCULAR | Status: DC
  Administered 2013-04-09 – 2013-04-10 (×4): 80 mg via INTRAVENOUS
  Filled 2013-04-09 (×6): qty 8

## 2013-04-09 MED ORDER — LOSARTAN POTASSIUM 50 MG PO TABS
100.0000 mg | ORAL_TABLET | Freq: Every day | ORAL | Status: DC
  Administered 2013-04-10 – 2013-04-11 (×2): 100 mg via ORAL
  Filled 2013-04-09 (×2): qty 2

## 2013-04-09 NOTE — Progress Notes (Signed)
Subjective: Converted NSR at 0230. Tolerated nasal CPAP overnight. Continues to ambulate with cardiac rehab. Pt w/o CP, SOB improved. Complaining of dry/raw feeling throat.   Tele:bradycardia and some missed beats/pauses.     Marland Kitchen amiodarone  400 mg Oral BID  . aspirin  81 mg Oral Daily  . atorvastatin  40 mg Oral q1800  . carvedilol  3.125 mg Oral BID WC  . clopidogrel  75 mg Oral Q breakfast  . furosemide  40 mg Intravenous Q12H  . insulin aspart  0-5 Units Subcutaneous QHS  . insulin aspart  0-9 Units Subcutaneous TID WC  . insulin aspart  20 Units Subcutaneous TID WC  . insulin glargine  40 Units Subcutaneous BID  . losartan  50 mg Oral Daily  . warfarin  15 mg Oral ONCE-1800  . Warfarin - Pharmacist Dosing Inpatient   Does not apply q1800     Objective: Vital signs in last 24 hours: Filed Vitals:   04/09/13 0028 04/09/13 0400 04/09/13 0730 04/09/13 0731  BP: 147/79 121/62  168/89  Pulse: 65     Temp:  98.5 F (36.9 C) 98.5 F (36.9 C)   TempSrc:  Oral Oral   Resp: 18 18    Height:      Weight:      SpO2: 98% 100%     Weight change:   Intake/Output Summary (Last 24 hours) at 04/09/13 0745 Last data filed at 04/09/13 0730  Gross per 24 hour  Intake  923.5 ml  Output   2950 ml  Net -2026.5 ml   Vitals reviewed. General: resting in recliner, arousable NAD HEENT: [place]/at, no scleral icterus Cardiac: RRR, no rubs, murmurs or gallops Pulm: ctab, moving normal volumes of air Abd: obese, soft, nontender, nondistended, BS present Ext: cool, 1+ pedal edema b/l lower ext Neuro: alert and oriented X3, cranial nerves II-XII grossly intact, moving all 4 ext  Lab Results: Basic Metabolic Panel:  Recent Labs Lab 04/08/13 0930 04/09/13 0246  NA 133* 134*  K 4.1 3.8  CL 95* 97  CO2 24 23  GLUCOSE 341* 248*  BUN 27* 26*  CREATININE 1.08 1.09  CALCIUM 8.8 8.3*  MG 2.0 2.1   CBC:  Recent Labs Lab 04/05/13 1057  04/08/13 0305 04/09/13 0246  WBC 13.7*  < > 10.5  9.2  NEUTROABS 9.6*  --   --   --   HGB 15.0  < > 13.3 13.1  HCT 42.7  < > 37.7* 37.3*  MCV 85.1  < > 83.8 83.8  PLT 153  < > 184 204  < > = values in this interval not displayed. Cardiac Enzymes:  Recent Labs Lab 04/05/13 1505 04/05/13 1942 04/06/13 0150  TROPONINI >20.00* 12.82* 8.53*   CBG:  Recent Labs Lab 04/07/13 1705 04/08/13 0818 04/08/13 1209 04/08/13 1611 04/08/13 2223 04/09/13 0733  GLUCAP 246* 253* 223* 204* 280* 230*   Hemoglobin A1C:  Recent Labs Lab 04/06/13 0150  HGBA1C 10.7*   Coagulation:  Recent Labs Lab 04/06/13 0150 04/07/13 0038 04/08/13 0305 04/09/13 0246  LABPROT 15.0 14.6 15.2 17.4*  INR 1.21 1.16 1.23 1.47   Micro Results: Recent Results (from the past 240 hour(s))  MRSA PCR SCREENING     Status: None   Collection Time    04/05/13  1:32 PM      Result Value Ref Range Status   MRSA by PCR NEGATIVE  NEGATIVE Final   Comment:  The GeneXpert MRSA Assay (FDA     approved for NASAL specimens     only), is one component of a     comprehensive MRSA colonization     surveillance program. It is not     intended to diagnose MRSA     infection nor to guide or     monitor treatment for     MRSA infections.   Medications: Prescriptions prior to admission  Medication Sig Dispense Refill  . Calcium 500 MG CHEW Chew 500 mg by mouth daily.      Marland Kitchen ibuprofen (ADVIL,MOTRIN) 400 MG tablet Take 400 mg by mouth every 6 (six) hours as needed for headache or mild pain.      Marland Kitchen insulin glargine (LANTUS) 100 UNIT/ML injection Inject 40 Units into the skin 2 (two) times daily.       . insulin lispro (HUMALOG KWIKPEN) 100 UNIT/ML injection Inject 20 Units into the skin 3 (three) times daily before meals. UAD      . losartan (COZAAR) 50 MG tablet Take 1 tablet (50 mg total) by mouth daily.  30 tablet  11  . metoprolol succinate (TOPROL-XL) 25 MG 24 hr tablet Take 1 tablet (25 mg total) by mouth daily.  30 tablet  11  . nitroGLYCERIN  (NITROSTAT) 0.4 MG SL tablet Place 1 tablet (0.4 mg total) under the tongue every 5 (five) minutes as needed.  25 tablet  6  . Rivaroxaban (XARELTO) 20 MG TABS tablet Take 1 tablet (20 mg total) by mouth daily with supper.  30 tablet  11   Scheduled Meds: . amiodarone  400 mg Oral BID  . aspirin  81 mg Oral Daily  . atorvastatin  40 mg Oral q1800  . carvedilol  3.125 mg Oral BID WC  . clopidogrel  75 mg Oral Q breakfast  . furosemide  40 mg Intravenous Q12H  . insulin aspart  0-5 Units Subcutaneous QHS  . insulin aspart  0-9 Units Subcutaneous TID WC  . insulin aspart  20 Units Subcutaneous TID WC  . insulin glargine  40 Units Subcutaneous BID  . losartan  50 mg Oral Daily  . Warfarin - Pharmacist Dosing Inpatient   Does not apply q1800   Continuous Infusions: . sodium chloride Stopped (04/06/13 0500)  . sodium chloride 10 mL/hr at 04/09/13 0700  . heparin 2,500 Units/hr (04/09/13 0700)  . nitroGLYCERIN Stopped (04/05/13 1707)   PRN Meds:.acetaminophen, ALPRAZolam, morphine injection, nitroGLYCERIN, ondansetron (ZOFRAN) IV  Cardiac Studies:  2/21 LHC  Diagnostic Procedure Details: The right groin was prepped, draped, and anesthetized with 1% lidocaine. Using the modified Seldinger technique, a 6 French sheath was introduced into the right femoral artery. Standard Judkins catheters were used for selective coronary angiography and left ventriculography. Catheter exchanges were performed over a wire. The diagnostic procedure was well-tolerated without immediate complications.  PROCEDURAL FINDINGS  Hemodynamics:  AO 137/68 mean 93 mm Hg  LV 139/31 mm Hg  Coronary angiography:  Coronary dominance: right  Left mainstem: Normal  Left anterior descending (LAD): The LAD is occluded in the mid vessel following a tiny second diagonal branch. The first diagonal branch is severely diseased proximally up to 90%. It then trifurcates into 3 small branches. The more apical branch is occluded.   Left circumflex (LCx): The LCx gives off one OM branch and then is occluded in the mid to distal vessel. The first OM is severely diseased proximally up to 90-95%. It is supplied by a vein graft.  Right coronary artery (RCA): The RCA has stents in the proximal vessel and at the crux. These are both patent. The RCA has diffuse nonobstructive disease less than 30%. The RV marginal branch is occluded and fills by collaterals.  The SVG to the diagonal is occluded proximally  The SVG to the PDA is occluded proximally.  The SVG to the OM1 is patent. There is a 40-50% stenosis in the body of the SVG  The LIMA to the LAD is patent.  Left ventriculography: Left ventricular systolic function is abnormal, LVEF is estimated at 40%, there is severe hypokinesis of the inferior wall. There is no significant mitral regurgitation  PCI Procedure Note: Following the diagnostic procedure, the decision was made to proceed with PCI of the distal LCx. Weight-based bivalirudin was given for anticoagulation. Plavix 600 mg was given orally. Once a therapeutic ACT was achieved, a 6 [place] XBLAD 4 guide catheter was inserted. A prowater coronary guidewire was used to cross the lesion. The lesion was predilated with a 2.0 mm balloon. With reperfusion there was a long lesion in the vessel. Distally the LCx gives off 3 small PLOM branches. The lesion was then stented with a 2.5 x 28 mm Promus stent. The stent was postdilated with a 2.5 mm noncompliant balloon. Following PCI, there was 0% residual stenosis and TIMI-3 flow. Final angiography confirmed an excellent result. Femoral hemostasis was achieved with an Angioseal device. At the end of procedure the patient received a single bolus of Aggrastat followed by infusion and Bivalirudin was discontinued. The patient tolerated the PCI procedure well. There were no immediate procedural complications. The patient was transferred to the post catheterization recovery area for further  monitoring. 255 cc on contrast was used.  PCI Data:  Vessel - LCx/Segment - mid to distal  Percent Stenosis (pre) 100%  TIMI-flow 0  Stent 2.5 x 28 mm Promus  Percent Stenosis (post) 0%  TIMI-flow (post) 3  Final Conclusions:  1. Severe 3 vessel obstructive CAD  2. Patent LIMA to the LAD  3. Patent SVG to the OM  4. Occluded SVG to the diagonal  5. Occluded SVG to the PDA  6. Patent stents in the native RCA  7. Moderate LV dysfunction  8. Successful stenting of the mid to distal native LCx with a DES. Given the small caliber of vessel and length of the lesion I did not feel a BMS was appropriate.  Recommendations: Continue dual antiplatelet therapy with ASA and Plavix for one month then Plavix indefinitely. Will need to resume anticoagulant therapy with either coumadin or Xarelto as well. Will continue Aggrastat for 12 hours and start IV heparin in 8 hours. Patient transferred to the ICU for further management.  Echo 04/08/13:  - Left ventricle: The cavity size was normal. There was mild concentric hypertrophy. Systolic function was normal. The estimated ejection fraction was in the range of 55% to 60%. Although no diagnostic regional wall motion abnormality was identified, this possibility cannot be completely excluded on the basis of this study. Doppler parameters are consistent with restrictive physiology, indicative of decreased left ventricular diastolic compliance and/or increased left atrial pressure. - Left atrium: The atrium was severely dilated. - Right ventricle: Systolic function was mildly reduced. - Right atrium: The atrium was mildly dilated.  Assessment/Plan: 58 y.o PMH CAD, HLD, HTN, DM, peripheral neuropathy, PE, DVT, MI post CABG (2002 [facility]), COPD, PVD, OSA, GERD presented 2/21 w/ STEMI s/p PCI.    #ST elevation myocardial infarction (STEMI) of  inferolateral wall -CAD/MI s/p CABG 2002, s/p LCx DES stent with noted severe 3 vessel CAD on cath 2/21  -Heparin gtt,  Asprin 81 mg, Lipitor 40, Cozaar 50 mg qd, Plavix 75, morphine 2 mg q1 prn, prn SL NTG -1-2+ edema lower extremities on Lasix 40 mg bid   #Atrial fibrillation-resolved -Heparin gtt, Coumadin, Amiodarone gtt. -follow INR -will start oral Amio 400 mg bid x 3 days, 400 mg qd x 7 days then 200 mg qd   #Bradycardia -consider d/c coreg if HR cant tolerate  -Monitor tele   #DM 2 (HA1C 10.7) -monitor cbgs  -Lantus 40 units bid, SSI-S and qhs  #HTN -cont cozaar, lasix  #Hx PE -was on Xarelto now on Coumadin, INR still subtherapeutic   #COPD Hx  -smoking cessation   #OSA -likely needs to wear cpap with pauses on tele.  -Pt only wants nasal mask does not have one at home and wants one for home use. Please order at discharge -will only wear nasal mask which we dont have here   #F/E/N -NSL -carb mod diet   #DVT px  -Heparin gtt, Coumadin   LOS: 4 days   Bobby Gallant, MD  04/09/2013, 7:45 AM  The patient was seen, examined and discussed with Bobby Gallant, MD, IM resident and I agree with the above.   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 He has residual ST elevations on ECG and now he developed a-fib with rate control.  After starting iv amiodarone he cardioverted to SR and has been in SR for the last 24 hours. On PO Amiodarone now. Continue coreq 3.125 mg po BID. Echocardiogram shows preserved LVEF, however restrictive physiology of diastolic function, severely dilated LA.  We discussed in details the necessity of using CPAP machine, he was evaluated 4-5 year ago, but never used the machine is he found it uncomfortable. We will order it now, so he can take it home.  We will increase Lasix to 80 mg iv Q12 H as he is still significantly fluid overloaded.   Bobby Lindsey, Bobby Lindsey 04/09/2013

## 2013-04-09 NOTE — Progress Notes (Signed)
Placed pt. On CPAP auto titrate (Min: 4, max: 15) via nasal mask. Pt. Is tolerating CPAP well at this time but states that he probably will not be able to tolerate CPAP all night.

## 2013-04-09 NOTE — Progress Notes (Signed)
[place] for heparin/Coumadin Indication: STEMI / hx DVT  No Known Allergies  Patient Measurements: Height: 6' (182.9 cm) Weight: 264 lb 1.8 oz (119.8 kg) IBW/kg (Calculated) : 77.6 Heparin Dosing Weight: 104 kg  Vital Signs: Temp: 97.8 F (36.6 C) (02/25 1602) Temp src: Oral (02/25 1602) BP: 121/66 mmHg (02/25 1602) Pulse Rate: 58 (02/25 1602)  Labs:  Recent Labs  04/07/13 0038  04/08/13 0305 04/08/13 0930 04/09/13 0246 04/09/13 1543  HGB 13.1  --  13.3  --  13.1  --   HCT 37.8*  --  37.7*  --  37.3*  --   PLT 153  --  184  --  204  --   LABPROT 14.6  --  15.2  --  17.4*  --   INR 1.16  --  1.23  --  1.47  --   HEPARINUNFRC 0.23*  < > 0.40  --  0.27* 0.52  CREATININE  --   --   --  1.08 1.09  --   < > = values in this interval not displayed.  Estimated Creatinine Clearance: 98.7 ml/min (by C-G formula based on Cr of 1.09).  Assessment: Bobby Lindsey is a 58 yo M with PMH of CAD s/p CABG, prior recurrent DVT/PE, DM, HTN, and HLD who presented to the ED 2/21 with acute STEMI. He received 324 mg aspirin and a 4000 unit heparin bolus in the ED and then taken emergently to cath where he underwent PCI with DES to circumflex. During PCI, he received Angiomax as well as Aggrastat. Aggrastat continued for 12 hours s/p cath.  Pharmacy has been consulted to dose coumadin with a heparin bridge.   Heparin drip 2750 uts/hr heparin level 0.52 at goal.  No bleeding noted  Goal of Therapy:  Heparin level 0.3 - 0.7 Monitor platelets by anticoagulation protocol: Yes   Plan:  Continue heparin to 2750 units/hr  Daily HL, CBC   Bonnita Nasuti Pharm.D. CPP, BCPS Clinical Pharmacist [phone] 59/25/2015 7:39 PM

## 2013-04-09 NOTE — Progress Notes (Signed)
[place] for heparin/Coumadin Indication: STEMI / hx DVT  No Known Allergies  Patient Measurements: Height: 6' (182.9 cm) Weight: 264 lb 1.8 oz (119.8 kg) IBW/kg (Calculated) : 77.6 Heparin Dosing Weight: 104 kg  Vital Signs: Temp: 98.5 F (36.9 C) (02/25 0730) Temp src: Oral (02/25 0730) BP: 168/89 mmHg (02/25 0731) Pulse Rate: 65 (02/25 0028)  Labs:  Recent Labs  04/07/13 0038  04/07/13 1825 04/08/13 0305 04/08/13 0930 04/09/13 0246  HGB 13.1  --   --  13.3  --  13.1  HCT 37.8*  --   --  37.7*  --  37.3*  PLT 153  --   --  184  --  204  LABPROT 14.6  --   --  15.2  --  17.4*  INR 1.16  --   --  1.23  --  1.47  HEPARINUNFRC 0.23*  < > 0.45 0.40  --  0.27*  CREATININE  --   --   --   --  1.08 1.09  < > = values in this interval not displayed.  Estimated Creatinine Clearance: 98.7 ml/min (by C-G formula based on Cr of 1.09).  Assessment: Bobby Lindsey is a 58 yo M with PMH of CAD s/p CABG, prior recurrent DVT/PE, DM, HTN, and HLD who presented to the ED 2/21 with acute STEMI. He received 324 mg aspirin and a 4000 unit heparin bolus in the ED and then taken emergently to cath where he underwent PCI with DES to circumflex. During PCI, he received Angiomax as well as Aggrastat. Aggrastat continued for 12 hours s/p cath.  Pharmacy has been consulted to dose coumadin with a heparin bridge.   Heparin level is subtherapeutic this AM at 0.27.   INR remains subtherapeutic this morning at 1.47. INR has been slow to increase during his admission, and therefore dosing will be more aggressive today.   CBC stable: Hgb 13.1, pltc 204. No bleeding issues noted  Per review of past notes, it appears when he was previously on warfarin, his dose was 10 mg daily. He was changed to xarelto on January 19th, though had poor compliance with therapy.  Goal of Therapy:  Heparin level 0.3 - 0.7 Monitor platelets by anticoagulation protocol: Yes   Plan:  -  Increase heparin to 2750 units/hr  - Check 6 hour HL - Coumadin 15 mg PO x 1 tonight - Continue to monitor daily INR, HL, CBC, s/s bleeding  Bobby Lindsey, PharmD Clinical Pharmacist-Resident Pager: [phone] Pharmacy: [phone] 04/09/2013 8:51 AM

## 2013-04-09 NOTE — Progress Notes (Signed)
Inpatient Diabetes Program Recommendations  AACE/ADA: New Consensus Statement on Inpatient Glycemic Control (2013)  Target Ranges:  Prepandial:   less than 140 mg/dL      Peak postprandial:   less than 180 mg/dL (1-2 hours)      Critically ill patients:  140 - 180 mg/dL   Results for DELONE, HECKLE (MRN [id number]) as of 04/09/2013 09:44  Ref. Range 04/08/2013 08:18 04/08/2013 12:09 04/08/2013 16:11 04/08/2013 22:23 04/09/2013 07:33  Glucose-Capillary Latest Range: 70-99 mg/dL 253 (H) 223 (H) 204 (H) 280 (H) 230 (H)    Diabetes history: DM2 Outpatient Diabetes medications: Lantus 40 units BID, Humalog 20 units TID Current orders for Inpatient glycemic control: Lantus 40 units BID, Novolog 20 units TID, Novolog 0-9 units AC, Novolog 0-5 units HS  Inpatient Diabetes Program Recommendations Insulin - Basal: Please conisder increasing Levemir to 43 units BID. Correction (SSI): Please increase Novolog correction to moderate scale.  Thanks, Barnie Alderman, RN, MSN, CCRN Diabetes Coordinator Inpatient Diabetes Program [phone] (Team Pager) [phone] ([facility] office) [phone] [facility] office)

## 2013-04-09 NOTE — Progress Notes (Signed)
CARDIAC REHAB PHASE I   PRE:  Rate/Rhythm: 37 SR  BP:  Supine:   Sitting: 127/71  Standing:    SaO2:   MODE:  Ambulation: 700 ft   POST:  Rate/Rhythm: 70 SR  BP:  Supine:   Sitting: 140/66  Standing:    SaO2:  1005-1120 Pt walked 700 ft on RA with steady gait. Stopped once to rest. MI education completed with pt and wife who voiced understanding. Pt has not been counting carbs or adhering to diabetic diet. Gave diabetic and heart healthy diets. Encouraged him to go to out patient diabetic classes if he needs review. Pt has frequent cough that he stated he has had only since being in hospital. Coughed constantly through myl ed. Notified pt's RN. Wife stated that pt is known to stop meds on his own. Stressed several times not to stop any med without discussing with MD. Discussed CRP2. Pt gave permission to refer to [facility]. Stated he did program there before. Pt needs to make risk factor changes re diet and exercise, keeping HGA1C lower, and wearing CPAP.   Graylon Good, RN BSN  04/09/2013 11:14 AM

## 2013-04-10 LAB — GLUCOSE, CAPILLARY
Glucose-Capillary: 146 mg/dL — ABNORMAL HIGH (ref 70–99)
Glucose-Capillary: 154 mg/dL — ABNORMAL HIGH (ref 70–99)
Glucose-Capillary: 184 mg/dL — ABNORMAL HIGH (ref 70–99)
Glucose-Capillary: 239 mg/dL — ABNORMAL HIGH (ref 70–99)

## 2013-04-10 LAB — CBC
HCT: 39 % (ref 39.0–52.0)
Hemoglobin: 13.8 g/dL (ref 13.0–17.0)
MCH: 29.7 pg (ref 26.0–34.0)
MCHC: 35.4 g/dL (ref 30.0–36.0)
MCV: 83.9 fL (ref 78.0–100.0)
Platelets: 225 10*3/uL (ref 150–400)
RBC: 4.65 MIL/uL (ref 4.22–5.81)
RDW: 13.5 % (ref 11.5–15.5)
WBC: 10.5 10*3/uL (ref 4.0–10.5)

## 2013-04-10 LAB — PROTIME-INR
INR: 1.67 — ABNORMAL HIGH (ref 0.00–1.49)
Prothrombin Time: 19.2 seconds — ABNORMAL HIGH (ref 11.6–15.2)

## 2013-04-10 LAB — HEPARIN LEVEL (UNFRACTIONATED): Heparin Unfractionated: 0.45 IU/mL (ref 0.30–0.70)

## 2013-04-10 MED ORDER — WARFARIN SODIUM 7.5 MG PO TABS
15.0000 mg | ORAL_TABLET | Freq: Once | ORAL | Status: AC
  Administered 2013-04-10: 15 mg via ORAL
  Filled 2013-04-10: qty 2

## 2013-04-10 NOTE — Progress Notes (Signed)
CARDIAC REHAB PHASE I   PRE:  Rate/Rhythm: 69SR  BP:  Supine: 151/71  Sitting:   Standing:    SaO2:   MODE:  Ambulation: 1050 ft   POST:  Rate/Rhythm: 71SR  BP:  Supine:   Sitting: 152/75  Standing:    SaO2:  0900-0931 Pt walked 1050 ft on RA with steady gait. Tolerated well. Did not need to stop and rest. To recliner after walk. No CP.   Graylon Good, RN BSN  04/10/2013 9:27 AM

## 2013-04-10 NOTE — Progress Notes (Addendum)
[place] for heparin/Coumadin Indication: STEMI / hx DVT  No Known Allergies  Patient Measurements: Height: 6' (182.9 cm) Weight: 264 lb 1.8 oz (119.8 kg) IBW/kg (Calculated) : 77.6 Heparin Dosing Weight: 104 kg  Vital Signs: Temp: 98 F (36.7 C) (02/26 0339) Temp src: Oral (02/26 0339) BP: 134/76 mmHg (02/26 0339) Pulse Rate: 62 (02/25 1954)  Labs:  Recent Labs  04/08/13 0305 04/08/13 0930 04/09/13 0246 04/09/13 1543 04/10/13 0241  HGB 13.3  --  13.1  --  13.8  HCT 37.7*  --  37.3*  --  39.0  PLT 184  --  204  --  225  LABPROT 15.2  --  17.4*  --  19.2*  INR 1.23  --  1.47  --  1.67*  HEPARINUNFRC 0.40  --  0.27* 0.52 0.45  CREATININE  --  1.08 1.09  --   --     Estimated Creatinine Clearance: 98.7 ml/min (by C-G formula based on Cr of 1.09).  Assessment: Bobby Lindsey is a 58 yo M with PMH of CAD s/p CABG, prior recurrent DVT/PE, DM, HTN, and HLD who presented to the ED 2/21 with acute STEMI. He received 324 mg aspirin and a 4000 unit heparin bolus in the ED and then taken emergently to cath where he underwent PCI with DES to circumflex. During PCI, he received Angiomax as well as Aggrastat. Aggrastat continued for 12 hours s/p cath.  Pharmacy has been consulted to dose coumadin with a heparin bridge.   Heparin level is remains therapeutic this AM at 0.45.   INR remains subtherapeutic this morning at 1.67. INR has been slow to increase during his admission, and therefore dosing will be more aggressive today. Of note, patient has been started on amiodarone during this admission which can elevate the INR, though this interaction has not been noted during this admission as his INR has been slow to increase. This interaction can appear later into amiodarone therapy, which will be something to continue to monitor for.  CBC stable: Hgb 13.8, pltc 225. No bleeding issues noted  Per review of past notes, it appears when he was previously on  warfarin, his dose was 10 mg daily. He was changed to xarelto on January 19th, though had poor compliance with therapy.  Goal of Therapy:  Heparin level 0.3 - 0.7 INR 2-3 Monitor platelets by anticoagulation protocol: Yes   Plan:  - Continue heparin gtt @ 2750 units/hr  - Coumadin 15 mg PO x 1 tonight - Continue to monitor daily INR, HL, CBC, s/s bleeding  Bobby Lindsey, PharmD Clinical Pharmacist-Resident Pager: [phone] Pharmacy: [phone] 04/10/2013 7:43 AM

## 2013-04-10 NOTE — Progress Notes (Signed)
Transferred to [facility] by wheelchair, stable. Report given to RN, belongings with pt.

## 2013-04-10 NOTE — Progress Notes (Signed)
Subjective:  Remains in SR, no complains.   Objective: Vital signs in last 24 hours: Filed Vitals:   04/09/13 1954 04/10/13 0008 04/10/13 0339 04/10/13 0746  BP: 110/62 105/48 134/76 113/54  Pulse: 62   65  Temp:  97.9 F (36.6 C) 98 F (36.7 C) 98.6 F (37 C)  TempSrc:  Oral Oral Oral  Resp:  20 20 20   Height:      Weight:      SpO2: 97% 96% 96% 96%   Weight change:   Intake/Output Summary (Last 24 hours) at 04/10/13 0831 Last data filed at 04/10/13 0700  Gross per 24 hour  Intake   2060 ml  Output   3500 ml  Net  -1440 ml   Vitals reviewed. General: resting in recliner, arousable NAD HEENT: [place]/at, no scleral icterus Cardiac: RRR, no rubs, murmurs or gallops Pulm: ctab, moving normal volumes of air Abd: obese, soft, nontender, nondistended, BS present Ext: cool, 1+ pedal edema b/l lower ext Neuro: alert and oriented X3, cranial nerves II-XII grossly intact, moving all 4 ext  Lab Results: Basic Metabolic Panel:  Recent Labs Lab 04/08/13 0930 04/09/13 0246  NA 133* 134*  K 4.1 3.8  CL 95* 97  CO2 24 23  GLUCOSE 341* 248*  BUN 27* 26*  CREATININE 1.08 1.09  CALCIUM 8.8 8.3*  MG 2.0 2.1   CBC:  Recent Labs Lab 04/05/13 1057  04/09/13 0246 04/10/13 0241  WBC 13.7*  < > 9.2 10.5  NEUTROABS 9.6*  --   --   --   HGB 15.0  < > 13.1 13.8  HCT 42.7  < > 37.3* 39.0  MCV 85.1  < > 83.8 83.9  PLT 153  < > 204 225  < > = values in this interval not displayed.  Cardiac Enzymes:  Recent Labs Lab 04/05/13 1505 04/05/13 1942 04/06/13 0150  TROPONINI >20.00* 12.82* 8.53*   CBG:  Recent Labs Lab 04/08/13 2223 04/09/13 0733 04/09/13 1258 04/09/13 1626 04/09/13 2209 04/10/13 0744  GLUCAP 280* 230* 157* 188* 190* 146*   Hemoglobin A1C:  Recent Labs Lab 04/06/13 0150  HGBA1C 10.7*   Coagulation:  Recent Labs Lab 04/07/13 0038 04/08/13 0305 04/09/13 0246 04/10/13 0241  LABPROT 14.6 15.2 17.4* 19.2*  INR 1.16 1.23 1.47 1.67*    Scheduled Meds: . amiodarone  400 mg Oral BID  . aspirin  81 mg Oral Daily  . atorvastatin  40 mg Oral q1800  . carvedilol  3.125 mg Oral BID WC  . clopidogrel  75 mg Oral Q breakfast  . furosemide  80 mg Intravenous BID  . insulin aspart  0-5 Units Subcutaneous QHS  . insulin aspart  0-9 Units Subcutaneous TID WC  . insulin aspart  20 Units Subcutaneous TID WC  . insulin glargine  40 Units Subcutaneous BID  . losartan  100 mg Oral Daily  . warfarin  15 mg Oral ONCE-1800  . Warfarin - Pharmacist Dosing Inpatient   Does not apply q1800   Cardiac Studies:  2/21 [facility]  Diagnostic Procedure Details: The right groin was prepped, draped, and anesthetized with 1% lidocaine. Using the modified Seldinger technique, a 6 French sheath was introduced into the right femoral artery. Standard Judkins catheters were used for selective coronary angiography and left ventriculography. Catheter exchanges were performed over a wire. The diagnostic procedure was well-tolerated without immediate complications.  PROCEDURAL FINDINGS  Hemodynamics:  AO 137/68 mean 93 mm Hg  LV 139/31 mm Hg  Coronary angiography:  Coronary dominance: right  Left mainstem: Normal  Left anterior descending (LAD): The LAD is occluded in the mid vessel following a tiny second diagonal branch. The first diagonal branch is severely diseased proximally up to 90%. It then trifurcates into 3 small branches. The more apical branch is occluded.  Left circumflex (LCx): The LCx gives off one OM branch and then is occluded in the mid to distal vessel. The first OM is severely diseased proximally up to 90-95%. It is supplied by a vein graft.  Right coronary artery (RCA): The RCA has stents in the proximal vessel and at the crux. These are both patent. The RCA has diffuse nonobstructive disease less than 30%. The RV marginal branch is occluded and fills by collaterals.  The SVG to the diagonal is occluded proximally  The SVG to the PDA is  occluded proximally.  The SVG to the OM1 is patent. There is a 40-50% stenosis in the body of the SVG  The LIMA to the LAD is patent.  Left ventriculography: Left ventricular systolic function is abnormal, LVEF is estimated at 40%, there is severe hypokinesis of the inferior wall. There is no significant mitral regurgitation  PCI Procedure Note: Following the diagnostic procedure, the decision was made to proceed with PCI of the distal LCx. Weight-based bivalirudin was given for anticoagulation. Plavix 600 mg was given orally. Once a therapeutic ACT was achieved, a 6 [place] XBLAD 4 guide catheter was inserted. A prowater coronary guidewire was used to cross the lesion. The lesion was predilated with a 2.0 mm balloon. With reperfusion there was a long lesion in the vessel. Distally the LCx gives off 3 small PLOM branches. The lesion was then stented with a 2.5 x 28 mm Promus stent. The stent was postdilated with a 2.5 mm noncompliant balloon. Following PCI, there was 0% residual stenosis and TIMI-3 flow. Final angiography confirmed an excellent result. Femoral hemostasis was achieved with an Angioseal device. At the end of procedure the patient received a single bolus of Aggrastat followed by infusion and Bivalirudin was discontinued. The patient tolerated the PCI procedure well. There were no immediate procedural complications. The patient was transferred to the post catheterization recovery area for further monitoring. 255 cc on contrast was used.  PCI Data:  Vessel - LCx/Segment - mid to distal  Percent Stenosis (pre) 100%  TIMI-flow 0  Stent 2.5 x 28 mm Promus  Percent Stenosis (post) 0%  TIMI-flow (post) 3  Final Conclusions:  1. Severe 3 vessel obstructive CAD  2. Patent LIMA to the LAD  3. Patent SVG to the OM  4. Occluded SVG to the diagonal  5. Occluded SVG to the PDA  6. Patent stents in the native RCA  7. Moderate LV dysfunction  8. Successful stenting of the mid to distal native LCx  with a DES. Given the small caliber of vessel and length of the lesion I did not feel a BMS was appropriate.  Recommendations: Continue dual antiplatelet therapy with ASA and Plavix for one month then Plavix indefinitely. Will need to resume anticoagulant therapy with either coumadin or Xarelto as well. Will continue Aggrastat for 12 hours and start IV heparin in 8 hours. Patient transferred to the ICU for further management.  Echo 04/08/13:  - Left ventricle: The cavity size was normal. There was mild concentric hypertrophy. Systolic function was normal. The estimated ejection fraction was in the range of 55% to 60%. Although no diagnostic regional wall motion abnormality was  identified, this possibility cannot be completely excluded on the basis of this study. Doppler parameters are consistent with restrictive physiology, indicative of decreased left ventricular diastolic compliance and/or increased left atrial pressure. - Left atrium: The atrium was severely dilated. - Right ventricle: Systolic function was mildly reduced. - Right atrium: The atrium was mildly dilated.   Assessment/Plan:  58 y.o PMH CAD, HLD, HTN, DM, peripheral neuropathy, PE, DVT, MI post CABG (2002 [facility]), COPD, PVD, OSA, GERD presented 2/21 w/ STEMI s/p PCI.   1. ST elevation myocardial infarction (STEMI) of inferolateral wall -CAD/MI s/p CABG 2002, s/p LCx DES stent with noted severe 3 vessel CAD on cath 2/21  -Heparin gtt, Asprin 81 mg, Lipitor 40, Cozaar 50 mg qd, Plavix 75, morphine 2 mg q1 prn, prn SL NTG  2. Acute on chronic diastolic CHF - still mildly fluid overloaded, we will continue Lasix 80 mg iv BID  3. Atrial fibrillation-resolved -Heparin gtt, Coumadin, Amiodarone gtt. -follow INR, still bellow 2, we will plan on discharge tomorrow -will start oral Amio 400 mg bid x 3 days, 400 mg qd x 7 days then 200 mg qd   4. DM 2 (HA1C 10.7) -monitor cbgs  -Lantus 40 units bid, SSI-S and qhs  5. HTN -cont  cozaar, lasix  6. Hx PE -was on Xarelto now on Coumadin, INR still subtherapeutic   7. COPD Hx  -smoking cessation   8. OSA -SW worked on getting his CPAP machine prior to discharge    LOS: 5 days   Dorothy Spark, MD  04/10/2013, 8:31 AM

## 2013-04-11 ENCOUNTER — Encounter (HOSPITAL_COMMUNITY): Payer: Self-pay | Admitting: Physician Assistant

## 2013-04-11 ENCOUNTER — Telehealth: Payer: Self-pay | Admitting: Cardiology

## 2013-04-11 DIAGNOSIS — Z6834 Body mass index (BMI) 34.0-34.9, adult: Secondary | ICD-10-CM

## 2013-04-11 DIAGNOSIS — R001 Bradycardia, unspecified: Secondary | ICD-10-CM

## 2013-04-11 DIAGNOSIS — I82409 Acute embolism and thrombosis of unspecified deep veins of unspecified lower extremity: Secondary | ICD-10-CM | POA: Insufficient documentation

## 2013-04-11 DIAGNOSIS — R748 Abnormal levels of other serum enzymes: Secondary | ICD-10-CM | POA: Insufficient documentation

## 2013-04-11 DIAGNOSIS — G4730 Sleep apnea, unspecified: Secondary | ICD-10-CM

## 2013-04-11 DIAGNOSIS — E6609 Other obesity due to excess calories: Secondary | ICD-10-CM

## 2013-04-11 DIAGNOSIS — E66811 Obesity, class 1: Secondary | ICD-10-CM

## 2013-04-11 DIAGNOSIS — G629 Polyneuropathy, unspecified: Secondary | ICD-10-CM | POA: Diagnosis present

## 2013-04-11 DIAGNOSIS — M1460 Charcot's joint, unspecified site: Secondary | ICD-10-CM | POA: Diagnosis present

## 2013-04-11 DIAGNOSIS — Z7901 Long term (current) use of anticoagulants: Secondary | ICD-10-CM

## 2013-04-11 LAB — CBC
HCT: 38.3 % — ABNORMAL LOW (ref 39.0–52.0)
Hemoglobin: 13.3 g/dL (ref 13.0–17.0)
MCH: 29.4 pg (ref 26.0–34.0)
MCHC: 34.7 g/dL (ref 30.0–36.0)
MCV: 84.5 fL (ref 78.0–100.0)
Platelets: 232 10*3/uL (ref 150–400)
RBC: 4.53 MIL/uL (ref 4.22–5.81)
RDW: 13.6 % (ref 11.5–15.5)
WBC: 11 10*3/uL — ABNORMAL HIGH (ref 4.0–10.5)

## 2013-04-11 LAB — PROTIME-INR
INR: 2.34 — ABNORMAL HIGH (ref 0.00–1.49)
Prothrombin Time: 24.9 seconds — ABNORMAL HIGH (ref 11.6–15.2)

## 2013-04-11 LAB — TSH: TSH: 4.287 u[IU]/mL (ref 0.350–4.500)

## 2013-04-11 LAB — COMPREHENSIVE METABOLIC PANEL
ALT: 13 U/L (ref 0–53)
AST: 15 U/L (ref 0–37)
Albumin: 3 g/dL — ABNORMAL LOW (ref 3.5–5.2)
Alkaline Phosphatase: 61 U/L (ref 39–117)
BUN: 18 mg/dL (ref 6–23)
CO2: 27 mEq/L (ref 19–32)
Calcium: 9.6 mg/dL (ref 8.4–10.5)
Chloride: 96 mEq/L (ref 96–112)
Creatinine, Ser: 1.18 mg/dL (ref 0.50–1.35)
GFR calc Af Amer: 77 mL/min — ABNORMAL LOW (ref >90)
GFR calc non Af Amer: 66 mL/min — ABNORMAL LOW (ref >90)
Glucose, Bld: 267 mg/dL — ABNORMAL HIGH (ref 70–99)
Potassium: 4.8 mEq/L (ref 3.7–5.3)
Sodium: 138 mEq/L (ref 137–147)
Total Bilirubin: 0.3 mg/dL (ref 0.3–1.2)
Total Protein: 7.7 g/dL (ref 6.0–8.3)

## 2013-04-11 LAB — T4, FREE: Free T4: 1.16 ng/dL (ref 0.80–1.80)

## 2013-04-11 LAB — GLUCOSE, CAPILLARY
Glucose-Capillary: 177 mg/dL — ABNORMAL HIGH (ref 70–99)
Glucose-Capillary: 206 mg/dL — ABNORMAL HIGH (ref 70–99)

## 2013-04-11 LAB — HEPARIN LEVEL (UNFRACTIONATED): Heparin Unfractionated: 0.3 IU/mL (ref 0.30–0.70)

## 2013-04-11 MED ORDER — AMIODARONE HCL 200 MG PO TABS
200.0000 mg | ORAL_TABLET | Freq: Two times a day (BID) | ORAL | Status: DC
  Administered 2013-04-11: 200 mg via ORAL
  Filled 2013-04-11 (×2): qty 1

## 2013-04-11 MED ORDER — LOSARTAN POTASSIUM 100 MG PO TABS: 100.0000 mg | ORAL_TABLET | Freq: Every day | ORAL | Status: DC

## 2013-04-11 MED ORDER — CLOPIDOGREL BISULFATE 75 MG PO TABS: 75.0000 mg | ORAL_TABLET | Freq: Every day | ORAL | Status: DC

## 2013-04-11 MED ORDER — ASPIRIN EC 81 MG PO TBEC: 81.0000 mg | DELAYED_RELEASE_TABLET | Freq: Every day | ORAL | Status: DC

## 2013-04-11 MED ORDER — WARFARIN SODIUM 5 MG PO TABS: 12.5000 mg | ORAL_TABLET | Freq: Every day | ORAL | Status: DC

## 2013-04-11 MED ORDER — ATORVASTATIN CALCIUM 40 MG PO TABS: 40.0000 mg | ORAL_TABLET | Freq: Every evening | ORAL | Status: DC

## 2013-04-11 MED ORDER — AMIODARONE HCL 200 MG PO TABS: ORAL_TABLET | ORAL | Status: DC

## 2013-04-11 MED ORDER — ASPIRIN EC 81 MG PO TBEC: 81.0000 mg | DELAYED_RELEASE_TABLET | Freq: Every day | ORAL | Status: AC

## 2013-04-11 NOTE — Progress Notes (Signed)
[place] for heparin/Coumadin Indication: STEMI / hx DVT  No Known Allergies  Patient Measurements: Height: 6' (182.9 cm) Weight: 265 lb (120.203 kg) IBW/kg (Calculated) : 77.6 Heparin Dosing Weight: 104 kg  Vital Signs: Temp: 97.6 F (36.4 C) (02/27 0501) Temp src: Oral (02/27 0501) BP: 124/66 mmHg (02/27 0501) Pulse Rate: 57 (02/27 0501)  Labs:  Recent Labs  04/09/13 0246 04/09/13 1543 04/10/13 0241 04/11/13 0300  HGB 13.1  --  13.8 13.3  HCT 37.3*  --  39.0 38.3*  PLT 204  --  225 232  LABPROT 17.4*  --  19.2* 24.9*  INR 1.47  --  1.67* 2.34*  HEPARINUNFRC 0.27* 0.52 0.45 0.30  CREATININE 1.09  --   --   --     Estimated Creatinine Clearance: 98.8 ml/min (by C-G formula based on Cr of 1.09).  Assessment: Bobby Lindsey is a 58 yo M with PMH of CAD s/p CABG, prior recurrent DVT/PE- Pharmacy has been consulted to dose coumadin with a heparin bridge. Heparin level this morning was therapeutic at 0.3units/mL and INR increased to therapeutic range at 2.34. Dr. Tamala Julian discontinued heparin with therapeutics INR. Plan for discharge today. Noted patient started on amiodarone during this admission. Amiodarone can potentiate INR, though this has not been noted yet during hospitalization. This can still occur later in amiodarone therapy. Plan for amiodarone 200mg  BID x2 weeks then 200mg  daily per conversation with Melina Copa and Dr. Thompson Caul note. CBC is stable and no bleeding noted.  Goal of Therapy:  INR 2-3 Monitor platelets by anticoagulation protocol: Yes   Plan:  1. Recommend warfarin 12.5mg  po daily with INR outpatient follow up on Monday or Tuesday 2. Daily INR and CBC while patient is hospitalized  Bobby Lindsey D. Ziah Turvey, PharmD, BCPS Clinical Pharmacist Pager: [phone] 04/11/2013 9:47 AM

## 2013-04-11 NOTE — Telephone Encounter (Signed)
New message   7 DAYS TCM per dayna pa. appt with scott Rance on 3./6

## 2013-04-11 NOTE — Discharge Summary (Addendum)
Discharge exam, data review and education with f/u plans accomplished as noted in this DC summary by Ms. Idolina Primer.

## 2013-04-11 NOTE — Progress Notes (Signed)
He is now therapeutic on coumadin. I have d/c'd heparin and lasix. Will check BMET, ambulate and if does well have discharged with early f/u in coumadin clinic. Home on Aspirin 81, Plavix, and coumadin x 1 month then only Coumadin and Plavix. Amiodarone discharge dose 200 mg BID x 2 weeks then  200 mg daily.

## 2013-04-11 NOTE — Discharge Summary (Signed)
Discharge Summary   Patient ID: Bobby Lindsey MRN: [id number], DOB/AGE: June 12, 1955 58 y.o. Admit date: 04/05/2013 D/C date:     04/11/2013  Primary Care Provider: [facility] Primary Cardiologist: Hochrein  Primary Discharge Diagnoses:  1. STEMI of inferolateral wall/CAD - s/p DES to mid-to-distal native LCx 04/05/13 - history of CAD/MI s/p CABG 2002, prior DES to RCa 2. Acute on chronic diastolic CHF - EF [id number] by cath 04/05/13, improved to 55-60% by echo 04/08/13 3. Atrial fibrillation, newly recognized, spont converted to NSR 4. Diabetes mellitus, uncontrolled, A1C 10.7 5. Hypertension 6. History of recurrent DVT/PE 7. OSA 8. Dyslipidemia 9. 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 History of chronically elevated CK level, pt has refused rheum followup in the past 11. Obesity Body mass index is 35.93 kg/(m^2).  Secondary Discharge Diagnoses:  1. COPD 2. Peripheral neuropathy 3. Charcot's joint of knee    Hospital Course: Bobby Lindsey is a 58 y/o M with history of CAD s/p CABG 2002, prior DES to RCA, HTN, HLD, DM, OSA, prior elevated CK (has refused rheum w/u), recurrent PE/DVT (on Xarelto prior to admission but had not taken 3 days prior to admission) who presented to [facility] with complaints of chest pain in context of STEMI. At noon the day prior to admission he developed substernal chest pain with some radiation to his left upper extremity. There was dyspnea and nausea but no diaphoresis. He took 2 sublingual nitroglycerin with improvement in pain. Symptoms returned at midnight and persisted so he presented to the . His pain improved. His symptoms returned at midnight so he presented to the ER where EKG showed sinus rhythm with inferior ST elevation and lateral ST depression. He was taken emergently to the cath lab which showed: 1. Severe 3 vessel obstructive CAD  2. Patent LIMA to the LAD  3. Patent SVG to the OM  4. Occluded  SVG to the diagonal  5. Occluded SVG to the PDA  6. Patent stents in the native RCA  7. Moderate LV dysfunction EF 40% He underwent successful stenting of the mid to distal native LCx with a DES. Given the small caliber of vessel and length of the lesion, Dr. Martinique did not feel that a BMS was appropriate. Triple therapy with ASA/Plavix/Coumadin x 1 month was recommended, then only Coumadin and Plavix -- although the patient had previously been on Xarelto, he expressed a desire to change to Coumadin thus this was initiated while in the hospital. Statin was resumed - note the patient has prior history of elevated CK thus lower dose was used with recommendation to repeat lipids, liver and CK in 4 weeks. On 04/07/13 he developed atrial fibrillation which was not previously known. Amiodarone was started. He developed bradycardia with pauses/possible mobitz type II thus beta blocker was discontinued. He was felt to be in acute diastolic CHF along with this thus was diuresed with IV Lasix. Losartan dose was increased. (Echo 04/08/13: mild concentric LVH, EF 55-60%, cannot exclude WMA, restrictive physiology, severely dilated LA, mildly reduced RV systolic function, mildly dilated RA.) While on amiodarone he converted to NSR. In all he diuresed 2.3L and discharge weight is 265 lbs. Lasix was stopped by Dr. Tamala Julian. CM assisted with helping the patient get a new mask for his CPAP to encourage compliance. Dr. Tamala Julian has seen and examined the patient today and feels he is stable for discharge.  Other FYI this admission:  - Amiodarone discharge dose  will be 200 mg BID x 2 weeks then 200 mg daily. On day of discharge, we sent for baseline thyroid function testing and hepatic function panel given amiodarone use. Further monitoring of organ systems while on amiodarone will be at discretion of primary cardiologist, if it is decided that he needs to be on this long-term. - He is also to f/u PCP for diabetes given uncontrolled  with A1C of 10.7.  - Per pharmacy recommendation, will continue Coumadin 12.5mg  daily until next INR check on Monday 3/2 - as above, plan is for Aspirin 81mg , Plavix, and Coumadin x 30 days (from PCI date of 04/05/13 - stop date 05/04/13) then only Coumadin and Plavix - will need arrangement of liver, lipids, CK level in 4 weeks  Discharge Vitals: Blood pressure 124/66, pulse 57, temperature 97.6 F (36.4 C), temperature source Oral, resp. rate 18, height 6' (1.829 m), weight 265 lb (120.203 kg), SpO2 100.00%.  Labs: Lab Results  Component Value Date   WBC 11.0* 04/11/2013   HGB 13.3 04/11/2013   HCT 38.3* 04/11/2013   MCV 84.5 04/11/2013   PLT 232 04/11/2013     Recent Labs Lab 04/09/13 0246  NA 134*  K 3.8  CL 97  CO2 23  BUN 26*  CREATININE 1.09  CALCIUM 8.3*  GLUCOSE 248*    Lab Results  Component Value Date   CHOL 109 03/11/2008   HDL 30.1* 03/11/2008   LDLCALC 58 03/11/2008   TRIG 105 03/11/2008     Diagnostic Studies/Procedures   Dg Chest Portable 1 View 04/05/2013   CLINICAL DATA:  Chest pain. Shortness of breath. Acute myocardial infarction. Prior CABG.  EXAM: PORTABLE CHEST - 1 VIEW  COMPARISON:  04/2011  FINDINGS: Heart size remains within normal limits. Left basilar pleural-parenchymal scarring is again seen. No evidence of acute infiltrate or pulmonary edema. No evidence of pleural effusion. Prior CABG again noted.  IMPRESSION: Stable left basilar pleural-parenchymal scarring. No acute findings.   Electronically Signed   By: Earle Gell M.D.   On: 04/05/2013 11:34   2D Echo 04/08/13  - Left ventricle: The cavity size was normal. There was mild concentric hypertrophy. Systolic function was normal. The estimated ejection fraction was in the range of 55% to 60%. Although no diagnostic regional wall motion abnormality was identified, this possibility cannot be completely excluded on the basis of this study. Doppler parameters are consistent with restrictive  physiology, indicative of decreased left ventricular diastolic compliance and/or increased left atrial pressure. - Left atrium: The atrium was severely dilated. - Right ventricle: Systolic function was mildly reduced. - Right atrium: The atrium was mildly dilated.   Cardiac Cath 04/05/13 Name: Keean Hilyard  MRN: [id number]  DOB: [birth year]  Procedure: Left Heart Cath, Selective Coronary Angiography, SVG angiography, LIMA angiography, LV angiography, PTCA/Stent of the mid to distal LCx.  Indication: 58 yo WM presents with an inferolateral STEMI. He has a history of Prior CABG remotely at [facility]. He had early failure of SVG to PDA and SVG to ? Diagonal and underwent Cypher stent of the native RCA. He also has a history of recurrent DVT/PE and has been on Xarelto. He has not taken this in 3 days and is on no antiplatelet therapy. His chest pain symptoms started 24 hours prior to admission. Presenting Ecg shows 2-3 mm ST elevation in the inferior leads with reciprocal ST depression in 1 and Avl.  Diagnostic Procedure Details: The right groin was prepped, draped, and anesthetized with 1% lidocaine.  will be 200 mg BID x 2 weeks then 200 mg daily. On day of discharge, we sent for baseline thyroid function testing and hepatic function panel given amiodarone use. Further monitoring of organ systems while on amiodarone will be at discretion of primary cardiologist, if it is decided that he needs to be on this long-term. - He is also to f/u PCP for diabetes given uncontrolled  with A1C of 10.7.  - Per pharmacy recommendation, will continue Coumadin 12.5mg  daily until next INR check on Monday 3/2 - as above, plan is for Aspirin 81mg , Plavix, and Coumadin x 30 days (from PCI date of 04/05/13 - stop date 05/04/13) then only Coumadin and Plavix - will need arrangement of liver, lipids, CK level in 4 weeks  Discharge Vitals: Blood pressure 124/66, pulse 57, temperature 97.6 F (36.4 C), temperature source Oral, resp. rate 18, height 6' (1.829 m), weight 265 lb (120.203 kg), SpO2 100.00%.  Labs: Lab Results  Component Value Date   WBC 11.0* 04/11/2013   HGB 13.3 04/11/2013   HCT 38.3* 04/11/2013   MCV 84.5 04/11/2013   PLT 232 04/11/2013     Recent Labs Lab 04/09/13 0246  NA 134*  K 3.8  CL 97  CO2 23  BUN 26*  CREATININE 1.09  CALCIUM 8.3*  GLUCOSE 248*    Lab Results  Component Value Date   CHOL 109 03/11/2008   HDL 30.1* 03/11/2008   LDLCALC 58 03/11/2008   TRIG 105 03/11/2008     Diagnostic Studies/Procedures   Dg Chest Portable 1 View 04/05/2013   CLINICAL DATA:  Chest pain. Shortness of breath. Acute myocardial infarction. Prior CABG.  EXAM: PORTABLE CHEST - 1 VIEW  COMPARISON:  04/2011  FINDINGS: Heart size remains within normal limits. Left basilar pleural-parenchymal scarring is again seen. No evidence of acute infiltrate or pulmonary edema. No evidence of pleural effusion. Prior CABG again noted.  IMPRESSION: Stable left basilar pleural-parenchymal scarring. No acute findings.   Electronically Signed   By: Earle Gell M.D.   On: 04/05/2013 11:34   2D Echo 04/08/13  - Left ventricle: The cavity size was normal. There was mild concentric hypertrophy. Systolic function was normal. The estimated ejection fraction was in the range of 55% to 60%. Although no diagnostic regional wall motion abnormality was identified, this possibility cannot be completely excluded on the basis of this study. Doppler parameters are consistent with restrictive  physiology, indicative of decreased left ventricular diastolic compliance and/or increased left atrial pressure. - Left atrium: The atrium was severely dilated. - Right ventricle: Systolic function was mildly reduced. - Right atrium: The atrium was mildly dilated.   Cardiac Cath 04/05/13 Name: Keean Hilyard  MRN: [id number]  DOB: [birth year]  Procedure: Left Heart Cath, Selective Coronary Angiography, SVG angiography, LIMA angiography, LV angiography, PTCA/Stent of the mid to distal LCx.  Indication: 58 yo WM presents with an inferolateral STEMI. He has a history of Prior CABG remotely at [facility]. He had early failure of SVG to PDA and SVG to ? Diagonal and underwent Cypher stent of the native RCA. He also has a history of recurrent DVT/PE and has been on Xarelto. He has not taken this in 3 days and is on no antiplatelet therapy. His chest pain symptoms started 24 hours prior to admission. Presenting Ecg shows 2-3 mm ST elevation in the inferior leads with reciprocal ST depression in 1 and Avl.  Diagnostic Procedure Details: The right groin was prepped, draped, and anesthetized with 1% lidocaine.  will be 200 mg BID x 2 weeks then 200 mg daily. On day of discharge, we sent for baseline thyroid function testing and hepatic function panel given amiodarone use. Further monitoring of organ systems while on amiodarone will be at discretion of primary cardiologist, if it is decided that he needs to be on this long-term. - He is also to f/u PCP for diabetes given uncontrolled  with A1C of 10.7.  - Per pharmacy recommendation, will continue Coumadin 12.5mg  daily until next INR check on Monday 3/2 - as above, plan is for Aspirin 81mg , Plavix, and Coumadin x 30 days (from PCI date of 04/05/13 - stop date 05/04/13) then only Coumadin and Plavix - will need arrangement of liver, lipids, CK level in 4 weeks  Discharge Vitals: Blood pressure 124/66, pulse 57, temperature 97.6 F (36.4 C), temperature source Oral, resp. rate 18, height 6' (1.829 m), weight 265 lb (120.203 kg), SpO2 100.00%.  Labs: Lab Results  Component Value Date   WBC 11.0* 04/11/2013   HGB 13.3 04/11/2013   HCT 38.3* 04/11/2013   MCV 84.5 04/11/2013   PLT 232 04/11/2013     Recent Labs Lab 04/09/13 0246  NA 134*  K 3.8  CL 97  CO2 23  BUN 26*  CREATININE 1.09  CALCIUM 8.3*  GLUCOSE 248*    Lab Results  Component Value Date   CHOL 109 03/11/2008   HDL 30.1* 03/11/2008   LDLCALC 58 03/11/2008   TRIG 105 03/11/2008     Diagnostic Studies/Procedures   Dg Chest Portable 1 View 04/05/2013   CLINICAL DATA:  Chest pain. Shortness of breath. Acute myocardial infarction. Prior CABG.  EXAM: PORTABLE CHEST - 1 VIEW  COMPARISON:  04/2011  FINDINGS: Heart size remains within normal limits. Left basilar pleural-parenchymal scarring is again seen. No evidence of acute infiltrate or pulmonary edema. No evidence of pleural effusion. Prior CABG again noted.  IMPRESSION: Stable left basilar pleural-parenchymal scarring. No acute findings.   Electronically Signed   By: Earle Gell M.D.   On: 04/05/2013 11:34   2D Echo 04/08/13  - Left ventricle: The cavity size was normal. There was mild concentric hypertrophy. Systolic function was normal. The estimated ejection fraction was in the range of 55% to 60%. Although no diagnostic regional wall motion abnormality was identified, this possibility cannot be completely excluded on the basis of this study. Doppler parameters are consistent with restrictive  physiology, indicative of decreased left ventricular diastolic compliance and/or increased left atrial pressure. - Left atrium: The atrium was severely dilated. - Right ventricle: Systolic function was mildly reduced. - Right atrium: The atrium was mildly dilated.   Cardiac Cath 04/05/13 Name: Keean Hilyard  MRN: [id number]  DOB: [birth year]  Procedure: Left Heart Cath, Selective Coronary Angiography, SVG angiography, LIMA angiography, LV angiography, PTCA/Stent of the mid to distal LCx.  Indication: 58 yo WM presents with an inferolateral STEMI. He has a history of Prior CABG remotely at [facility]. He had early failure of SVG to PDA and SVG to ? Diagonal and underwent Cypher stent of the native RCA. He also has a history of recurrent DVT/PE and has been on Xarelto. He has not taken this in 3 days and is on no antiplatelet therapy. His chest pain symptoms started 24 hours prior to admission. Presenting Ecg shows 2-3 mm ST elevation in the inferior leads with reciprocal ST depression in 1 and Avl.  Diagnostic Procedure Details: The right groin was prepped, draped, and anesthetized with 1% lidocaine.  Discharge Summary   Patient ID: Bobby Lindsey MRN: [id number], DOB/AGE: June 12, 1955 58 y.o. Admit date: 04/05/2013 D/C date:     04/11/2013  Primary Care Provider: [facility] Primary Cardiologist: Hochrein  Primary Discharge Diagnoses:  1. STEMI of inferolateral wall/CAD - s/p DES to mid-to-distal native LCx 04/05/13 - history of CAD/MI s/p CABG 2002, prior DES to RCa 2. Acute on chronic diastolic CHF - EF [id number] by cath 04/05/13, improved to 55-60% by echo 04/08/13 3. Atrial fibrillation, newly recognized, spont converted to NSR 4. Diabetes mellitus, uncontrolled, A1C 10.7 5. Hypertension 6. History of recurrent DVT/PE 7. OSA 8. Dyslipidemia 9. 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 History of chronically elevated CK level, pt has refused rheum followup in the past 11. Obesity Body mass index is 35.93 kg/(m^2).  Secondary Discharge Diagnoses:  1. COPD 2. Peripheral neuropathy 3. Charcot's joint of knee    Hospital Course: Bobby Lindsey is a 58 y/o M with history of CAD s/p CABG 2002, prior DES to RCA, HTN, HLD, DM, OSA, prior elevated CK (has refused rheum w/u), recurrent PE/DVT (on Xarelto prior to admission but had not taken 3 days prior to admission) who presented to [facility] with complaints of chest pain in context of STEMI. At noon the day prior to admission he developed substernal chest pain with some radiation to his left upper extremity. There was dyspnea and nausea but no diaphoresis. He took 2 sublingual nitroglycerin with improvement in pain. Symptoms returned at midnight and persisted so he presented to the . His pain improved. His symptoms returned at midnight so he presented to the ER where EKG showed sinus rhythm with inferior ST elevation and lateral ST depression. He was taken emergently to the cath lab which showed: 1. Severe 3 vessel obstructive CAD  2. Patent LIMA to the LAD  3. Patent SVG to the OM  4. Occluded  SVG to the diagonal  5. Occluded SVG to the PDA  6. Patent stents in the native RCA  7. Moderate LV dysfunction EF 40% He underwent successful stenting of the mid to distal native LCx with a DES. Given the small caliber of vessel and length of the lesion, Dr. Martinique did not feel that a BMS was appropriate. Triple therapy with ASA/Plavix/Coumadin x 1 month was recommended, then only Coumadin and Plavix -- although the patient had previously been on Xarelto, he expressed a desire to change to Coumadin thus this was initiated while in the hospital. Statin was resumed - note the patient has prior history of elevated CK thus lower dose was used with recommendation to repeat lipids, liver and CK in 4 weeks. On 04/07/13 he developed atrial fibrillation which was not previously known. Amiodarone was started. He developed bradycardia with pauses/possible mobitz type II thus beta blocker was discontinued. He was felt to be in acute diastolic CHF along with this thus was diuresed with IV Lasix. Losartan dose was increased. (Echo 04/08/13: mild concentric LVH, EF 55-60%, cannot exclude WMA, restrictive physiology, severely dilated LA, mildly reduced RV systolic function, mildly dilated RA.) While on amiodarone he converted to NSR. In all he diuresed 2.3L and discharge weight is 265 lbs. Lasix was stopped by Dr. Tamala Julian. CM assisted with helping the patient get a new mask for his CPAP to encourage compliance. Dr. Tamala Julian has seen and examined the patient today and feels he is stable for discharge.  Other FYI this admission:  - Amiodarone discharge dose

## 2013-04-14 ENCOUNTER — Ambulatory Visit (INDEPENDENT_AMBULATORY_CARE_PROVIDER_SITE_OTHER): Payer: PRIVATE HEALTH INSURANCE | Admitting: *Deleted

## 2013-04-14 DIAGNOSIS — Z5181 Encounter for therapeutic drug level monitoring: Secondary | ICD-10-CM

## 2013-04-14 DIAGNOSIS — I82409 Acute embolism and thrombosis of unspecified deep veins of unspecified lower extremity: Secondary | ICD-10-CM

## 2013-04-14 DIAGNOSIS — Z7901 Long term (current) use of anticoagulants: Secondary | ICD-10-CM

## 2013-04-14 DIAGNOSIS — Z79899 Other long term (current) drug therapy: Secondary | ICD-10-CM | POA: Insufficient documentation

## 2013-04-14 DIAGNOSIS — I2699 Other pulmonary embolism without acute cor pulmonale: Secondary | ICD-10-CM | POA: Insufficient documentation

## 2013-04-14 DIAGNOSIS — I2119 ST elevation (STEMI) myocardial infarction involving other coronary artery of inferior wall: Secondary | ICD-10-CM

## 2013-04-14 DIAGNOSIS — I4891 Unspecified atrial fibrillation: Secondary | ICD-10-CM

## 2013-04-14 LAB — POCT INR: INR: 4.6

## 2013-04-14 NOTE — Patient Instructions (Signed)

## 2013-04-17 ENCOUNTER — Telehealth: Payer: Self-pay | Admitting: Cardiology

## 2013-04-17 NOTE — Telephone Encounter (Signed)
Left message for call back. Has appointment on 3/6 with [name] PA and CC.

## 2013-04-17 NOTE — Telephone Encounter (Signed)
New Problem:  Pt states he believes one of his meds he is taking is causing him to cough. He states he is taking about 8 meds and isn't sure which one is causing this symptom but would like to speak to a nurse.

## 2013-04-18 ENCOUNTER — Ambulatory Visit (INDEPENDENT_AMBULATORY_CARE_PROVIDER_SITE_OTHER): Payer: PRIVATE HEALTH INSURANCE | Admitting: Physician Assistant

## 2013-04-18 ENCOUNTER — Other Ambulatory Visit: Payer: Self-pay | Admitting: *Deleted

## 2013-04-18 ENCOUNTER — Encounter: Payer: Self-pay | Admitting: Physician Assistant

## 2013-04-18 ENCOUNTER — Ambulatory Visit
Admission: RE | Admit: 2013-04-18 | Discharge: 2013-04-18 | Disposition: A | Discharge status: Home / Self Care | Payer: PRIVATE HEALTH INSURANCE | Source: Ambulatory Visit | Attending: Physician Assistant | Admitting: Physician Assistant

## 2013-04-18 ENCOUNTER — Ambulatory Visit (INDEPENDENT_AMBULATORY_CARE_PROVIDER_SITE_OTHER): Payer: PRIVATE HEALTH INSURANCE

## 2013-04-18 VITALS — BP 128/80 | HR 63 | Ht 72.0 in | Wt 262.0 lb

## 2013-04-18 DIAGNOSIS — R059 Cough, unspecified: Secondary | ICD-10-CM

## 2013-04-18 DIAGNOSIS — E785 Hyperlipidemia, unspecified: Secondary | ICD-10-CM

## 2013-04-18 DIAGNOSIS — R0602 Shortness of breath: Secondary | ICD-10-CM

## 2013-04-18 DIAGNOSIS — R05 Cough: Secondary | ICD-10-CM

## 2013-04-18 DIAGNOSIS — Z5181 Encounter for therapeutic drug level monitoring: Secondary | ICD-10-CM

## 2013-04-18 DIAGNOSIS — I2510 Atherosclerotic heart disease of native coronary artery without angina pectoris: Secondary | ICD-10-CM

## 2013-04-18 DIAGNOSIS — I5032 Chronic diastolic (congestive) heart failure: Secondary | ICD-10-CM

## 2013-04-18 DIAGNOSIS — I2699 Other pulmonary embolism without acute cor pulmonale: Secondary | ICD-10-CM

## 2013-04-18 DIAGNOSIS — I4891 Unspecified atrial fibrillation: Secondary | ICD-10-CM

## 2013-04-18 DIAGNOSIS — I82409 Acute embolism and thrombosis of unspecified deep veins of unspecified lower extremity: Secondary | ICD-10-CM

## 2013-04-18 DIAGNOSIS — Z7901 Long term (current) use of anticoagulants: Secondary | ICD-10-CM

## 2013-04-18 DIAGNOSIS — I252 Old myocardial infarction: Secondary | ICD-10-CM

## 2013-04-18 DIAGNOSIS — I2119 ST elevation (STEMI) myocardial infarction involving other coronary artery of inferior wall: Secondary | ICD-10-CM

## 2013-04-18 LAB — BASIC METABOLIC PANEL
BUN: 13 mg/dL (ref 6–23)
CO2: 27 mEq/L (ref 19–32)
Calcium: 9.7 mg/dL (ref 8.4–10.5)
Chloride: 101 mEq/L (ref 96–112)
Creatinine, Ser: 1.1 mg/dL (ref 0.4–1.5)
GFR: 73.76 mL/min (ref >60.00)
Glucose, Bld: 214 mg/dL — ABNORMAL HIGH (ref 70–99)
Potassium: 5.2 mEq/L — ABNORMAL HIGH (ref 3.5–5.1)
Sodium: 135 mEq/L (ref 135–145)

## 2013-04-18 LAB — BRAIN NATRIURETIC PEPTIDE: Pro B Natriuretic peptide (BNP): 191 pg/mL — ABNORMAL HIGH (ref 0.0–100.0)

## 2013-04-18 LAB — POCT INR: INR: 2.1

## 2013-04-18 MED ORDER — METOPROLOL SUCCINATE ER 25 MG PO TB24: 12.5000 mg | ORAL_TABLET | Freq: Every day | ORAL | Status: DC

## 2013-04-18 NOTE — Progress Notes (Signed)
[address] [place], [place]  [address] Phone: [phone] Fax:  [phone]  Date:  04/18/2013   ID:  Bobby Lindsey, DOB 1955/05/14, MRN [id number]  PCP:  Daniel Nones  Cardiologist:  Dr. Rollene Rotunda     History of Present Illness: Bobby Lindsey is a 58 y.o. male with a hx of CAD s/p CABG 2002 and prior DES to RCA, recurrent pulmonary embolism/DVT, HTN, HL, DM, OSA.  He was recently admitted to [facility] with an Inferior STEMI.  Emergent LHC demonstrated patent S-OM1 and L-LAD, occluded S-Dx and S-PDA.  EF was 40% with inf wall motion abnormality.  Echo demonstrated normal LVF prior to d/c.  Culprit for STEMI was an occluded native CFX which was treated with DES.  Triple Rx was recommended x 1 month, then coumadin + Plavix.  He was previously on Xarelto, but this was changed to coumadin.  Patient has a hx of elevated CK.  Statin was resumed with plans to follow LFTs and CPK.  Hospitalization was c/b AFib and a/c diastolic CHF.  He was started on Amiodarone.  He developed Mobitz II and beta blocker was d/c'd.  He was d/c in NSR.    He has noted a non-productive cough and DOE since d/c.  Weight is decreased.  No further chest pain.  He sleeps on 1-2 pillows chronically.  No PND. He wears CPAP.  LE edema is improved.  He denies syncope.     Recent Labs: 04/11/2013: ALT 13; Creatinine 1.18; Hemoglobin 13.3; Potassium 4.8; TSH 4.287   Wt Readings from Last 3 Encounters:  04/18/13 262 lb (118.842 kg)  04/10/13 265 lb (120.203 kg)  04/10/13 265 lb (120.203 kg)     Past Medical History  Diagnosis Date  . HTN (hypertension)     x 15 years  . Dyslipidemia   . Diabetes mellitus     a. A1C 10.7 in 03/2013.  Marland Kitchen Peripheral neuropathy   . Charcot's joint of knee     UNSPECIFIED AREA  . Pulmonary embolism   . Deep venous thrombosis     right lower extremity  . COPD (chronic obstructive pulmonary disease)   . Sleep apnea   . GERD (gastroesophageal reflux disease)   .  Diastolic CHF     a. EF 40% by cath, 55-60% during 03/2013 adm, required IV diuresis.  . Atrial fibrillation     a. Transient during 03/2013 admission.  . DVT, lower extremity, recurrent     a. Hx recurrent DVT per record.  . Pulmonary emboli     a. Hx PE per record.  . Bradycardia     a. Bradycardia/pauses/possible Mobitz II during 03/2013 adm. Not on BB due to this.  . Elevated CK     a. Pt has refused rheum workup in the past.  . Obesity   . Hx of cardiovascular stress test     a. Lexiscan Myoview (03/2010):  diaph atten vs inf scar, no ischemia, EF 47%; Low Risk.  Marland Kitchen Hx of echocardiogram     a. Echo (04/08/13):  Mild LVH, EF 55-60%, restrictive physiology, severe LAE, mild reduced RVSF, mild RAE.  Marland Kitchen CAD (coronary artery disease)     a. s/p CABG 2002. b. Hx Cypher stent to the RCA. c. Inf-lat STEMI 03/2013:  LHC (04/05/13):  mLAD occluded, pD1 90, apical br of Dx occluded, CFX occluded, pOM1 90-95, RCA stents patent, diff RCA 30, S-Dx occluded, S-PDA occluded, S-OM1 40-50, L-LAD patent, EF 40%  with inf HK.  PCI:  Promus (2.5 x 28) DES to mid to dist CFX.    Current Outpatient Prescriptions  Medication Sig Dispense Refill  . amiodarone (PACERONE) 200 MG tablet Take 1 tablet by mouth twice a day for 2 weeks, then decrease to 1 tablet once a day.  60 tablet  0  . aspirin EC 81 MG tablet Take 1 tablet (81 mg total) by mouth daily. Stop taking after 05/04/13.      Marland Kitchen atorvastatin (LIPITOR) 40 MG tablet Take 1 tablet (40 mg total) by mouth every evening.  30 tablet  6  . Calcium 500 MG CHEW Chew 500 mg by mouth daily.      . clopidogrel (PLAVIX) 75 MG tablet Take 1 tablet (75 mg total) by mouth daily with breakfast.  30 tablet  6  . insulin glargine (LANTUS) 100 UNIT/ML injection Inject 40 Units into the skin 2 (two) times daily.       . insulin lispro (HUMALOG KWIKPEN) 100 UNIT/ML injection Inject 20 Units into the skin 3 (three) times daily before meals. UAD      . losartan (COZAAR) 100 MG tablet  Take 1 tablet (100 mg total) by mouth daily.  30 tablet  6  . nitroGLYCERIN (NITROSTAT) 0.4 MG SL tablet Place 1 tablet (0.4 mg total) under the tongue every 5 (five) minutes as needed.  25 tablet  6  . warfarin (COUMADIN) 5 MG tablet Take 2.5 tablets (12.5 mg total) by mouth daily.  60 tablet  3   No current facility-administered medications for this visit.    Allergies:   Review of patient's allergies indicates no known allergies.   Social History:  The patient  reports that he has never smoked. He has never used smokeless tobacco. He reports that he drinks alcohol. He reports that he does not use illicit drugs.   Family History:  The patient's family history includes Heart disease in his mother.   ROS:  Please see the history of present illness.   No fevers.  No melena or hematochezia.   All other systems reviewed and negative.   PHYSICAL EXAM: VS:  BP 128/80  Pulse 63  Ht 6' (1.829 m)  Wt 262 lb (118.842 kg)  BMI 35.53 kg/m2 Well nourished, well developed, in no acute distress HEENT: normal Neck: no JVD Cardiac:  normal S1, S2; RR; no murmur Lungs:  clear to auscultation bilaterally, no wheezing, rhonchi or rales Abd: soft, nontender, no hepatomegaly Ext: tr-1+ bilateral LE edema(R>L) Skin: warm and dry Neuro:  CNs 2-12 intact, no focal abnormalities noted  EKG:  NSR, HR 62, inf Q waves, residual inf ST elevation (improved from prior tracing), PR 230     ASSESSMENT AND PLAN:  1. CAD, s/p Recent Inferior STEMI:  He is doing well s/p DES to native CFX in setting of Inf MI.  He will remain on ASA, Plavix, Coumadin x 30 days.  He will then remain on Plavix and Coumadin.  Refer to cardiac rehab at [facility].  Continue statin.  See below - resume Toprol XL 12.5 mg QD. 2. Dyspnea:  Patient stopped Amiodarone on his own as he thought this was causing his cough and dyspnea.  He is not willing to resume this.  He does not look volume overloaded.  I will obtain a BMET and BNP and a CXR.     3. Atrial Fibrillation:  Maintaining NSR.  He is not willing to resume Amiodarone.  We discussed that  this is not likely causing his symptoms.  He is on long term coumadin.   4. Chronic Diastolic CHF:  Volume appears stable. Etiology of dyspnea is not clear.  Check BMET and BNP today as well as a CXR.   5. Hypertension:  Controlled. 6. Hyperlipidemia:  Continue statin.  Check Lipids and LFTs and total CPK in 4 weeks (he has a hx of chronically elevated CPK).   7. Diabetes Mellitus:  F/u with primary care. 8. Hx of Pulmonary Embolism:  Continue coumadin. 9. Disposition:  F/u with Dr. Rollene Rotunda in 6 weeks.   Signed, Lenzy Vranich, PA-C  04/18/2013 12:08 PM

## 2013-04-18 NOTE — Patient Instructions (Addendum)
START 04/21/13 TOPROL XL 25 MG TABLET YOU WILL TAKE 1/2 TAB DAILY = 12.5 MG DAILY  LAB WORK TODAY; BMET, BNP  THIS IS LAB WORK IS TO BE DONE IN 4 WEEKS; FASTING CHOLESTEROL PANEL AND CREATINE KINASE   You have been referred to [place]  A chest x-ray TODAY AT [facility] [address] @ [place] PHONE [phone]. takes a picture of the organs and structures inside the chest, including the heart, lungs, and blood vessels. This test can show several things, including, whether the heart is enlarges; whether fluid is building up in the lungs; and whether pacemaker / defibrillator leads are still in place.  FOLLOW UP WITH DR. HOCHREIN IN ABOUT 6 WEEKS

## 2013-04-18 NOTE — Telephone Encounter (Signed)
Seen on 3/6 by [name] PA.

## 2013-04-19 ENCOUNTER — Ambulatory Visit: Payer: Self-pay

## 2013-04-21 ENCOUNTER — Telehealth: Payer: Self-pay | Admitting: *Deleted

## 2013-04-21 DIAGNOSIS — I5033 Acute on chronic diastolic (congestive) heart failure: Secondary | ICD-10-CM

## 2013-04-21 DIAGNOSIS — E875 Hyperkalemia: Secondary | ICD-10-CM

## 2013-04-21 MED ORDER — FUROSEMIDE 20 MG PO TABS: 20.0000 mg | ORAL_TABLET | Freq: Every day | ORAL | Status: DC

## 2013-04-21 NOTE — Telephone Encounter (Signed)
Pt notified about lab and cxr results with verbal understanding, repeat bmet to be done in the [place] office 04/28/13 pt aware, Rx sent in for lasix 20 mg daily to [place].

## 2013-04-23 ENCOUNTER — Ambulatory Visit (INDEPENDENT_AMBULATORY_CARE_PROVIDER_SITE_OTHER): Payer: PRIVATE HEALTH INSURANCE

## 2013-04-23 DIAGNOSIS — I82409 Acute embolism and thrombosis of unspecified deep veins of unspecified lower extremity: Secondary | ICD-10-CM

## 2013-04-23 DIAGNOSIS — I2699 Other pulmonary embolism without acute cor pulmonale: Secondary | ICD-10-CM

## 2013-04-23 DIAGNOSIS — Z7901 Long term (current) use of anticoagulants: Secondary | ICD-10-CM

## 2013-04-23 DIAGNOSIS — I4891 Unspecified atrial fibrillation: Secondary | ICD-10-CM

## 2013-04-23 DIAGNOSIS — Z5181 Encounter for therapeutic drug level monitoring: Secondary | ICD-10-CM

## 2013-04-23 DIAGNOSIS — I2119 ST elevation (STEMI) myocardial infarction involving other coronary artery of inferior wall: Secondary | ICD-10-CM

## 2013-04-23 LAB — POCT INR: INR: 4.4

## 2013-04-28 ENCOUNTER — Other Ambulatory Visit: Payer: PRIVATE HEALTH INSURANCE

## 2013-04-30 ENCOUNTER — Ambulatory Visit (INDEPENDENT_AMBULATORY_CARE_PROVIDER_SITE_OTHER): Payer: PRIVATE HEALTH INSURANCE

## 2013-04-30 ENCOUNTER — Ambulatory Visit (INDEPENDENT_AMBULATORY_CARE_PROVIDER_SITE_OTHER): Payer: PRIVATE HEALTH INSURANCE | Admitting: *Deleted

## 2013-04-30 DIAGNOSIS — I5033 Acute on chronic diastolic (congestive) heart failure: Secondary | ICD-10-CM

## 2013-04-30 DIAGNOSIS — I509 Heart failure, unspecified: Secondary | ICD-10-CM

## 2013-04-30 DIAGNOSIS — I4891 Unspecified atrial fibrillation: Secondary | ICD-10-CM

## 2013-04-30 DIAGNOSIS — Z7901 Long term (current) use of anticoagulants: Secondary | ICD-10-CM

## 2013-04-30 DIAGNOSIS — I82409 Acute embolism and thrombosis of unspecified deep veins of unspecified lower extremity: Secondary | ICD-10-CM

## 2013-04-30 DIAGNOSIS — I2119 ST elevation (STEMI) myocardial infarction involving other coronary artery of inferior wall: Secondary | ICD-10-CM

## 2013-04-30 DIAGNOSIS — E875 Hyperkalemia: Secondary | ICD-10-CM

## 2013-04-30 DIAGNOSIS — I2699 Other pulmonary embolism without acute cor pulmonale: Secondary | ICD-10-CM

## 2013-04-30 DIAGNOSIS — Z5181 Encounter for therapeutic drug level monitoring: Secondary | ICD-10-CM

## 2013-04-30 LAB — POCT INR: INR: 4.6

## 2013-05-01 ENCOUNTER — Telehealth: Payer: Self-pay | Admitting: *Deleted

## 2013-05-01 LAB — BASIC METABOLIC PANEL
BUN/Creatinine Ratio: 20 (ref 9–20)
BUN: 21 mg/dL (ref 6–24)
CO2: 25 mmol/L (ref 18–29)
Calcium: 9.5 mg/dL (ref 8.7–10.2)
Chloride: 97 mmol/L (ref 97–108)
Creatinine, Ser: 1.03 mg/dL (ref 0.76–1.27)
GFR calc Af Amer: 92 mL/min/{1.73_m2} (ref >59)
GFR calc non Af Amer: 80 mL/min/{1.73_m2} (ref >59)
Glucose: 212 mg/dL — ABNORMAL HIGH (ref 65–99)
Potassium: 5 mmol/L (ref 3.5–5.2)
Sodium: 138 mmol/L (ref 134–144)

## 2013-05-01 NOTE — Telephone Encounter (Signed)
lmom labs ok no changes to be made

## 2013-05-11 ENCOUNTER — Encounter (HOSPITAL_BASED_OUTPATIENT_CLINIC_OR_DEPARTMENT_OTHER): Payer: PRIVATE HEALTH INSURANCE

## 2013-05-14 ENCOUNTER — Ambulatory Visit (INDEPENDENT_AMBULATORY_CARE_PROVIDER_SITE_OTHER): Payer: PRIVATE HEALTH INSURANCE

## 2013-05-14 DIAGNOSIS — I2119 ST elevation (STEMI) myocardial infarction involving other coronary artery of inferior wall: Secondary | ICD-10-CM

## 2013-05-14 DIAGNOSIS — I4891 Unspecified atrial fibrillation: Secondary | ICD-10-CM

## 2013-05-14 DIAGNOSIS — Z7901 Long term (current) use of anticoagulants: Secondary | ICD-10-CM

## 2013-05-14 DIAGNOSIS — I2699 Other pulmonary embolism without acute cor pulmonale: Secondary | ICD-10-CM

## 2013-05-14 DIAGNOSIS — I82409 Acute embolism and thrombosis of unspecified deep veins of unspecified lower extremity: Secondary | ICD-10-CM

## 2013-05-14 DIAGNOSIS — Z5181 Encounter for therapeutic drug level monitoring: Secondary | ICD-10-CM

## 2013-05-14 LAB — POCT INR: INR: 2.8

## 2013-05-28 ENCOUNTER — Ambulatory Visit (INDEPENDENT_AMBULATORY_CARE_PROVIDER_SITE_OTHER): Payer: PRIVATE HEALTH INSURANCE

## 2013-05-28 DIAGNOSIS — I2119 ST elevation (STEMI) myocardial infarction involving other coronary artery of inferior wall: Secondary | ICD-10-CM

## 2013-05-28 DIAGNOSIS — I4891 Unspecified atrial fibrillation: Secondary | ICD-10-CM

## 2013-05-28 DIAGNOSIS — I2699 Other pulmonary embolism without acute cor pulmonale: Secondary | ICD-10-CM

## 2013-05-28 DIAGNOSIS — I82409 Acute embolism and thrombosis of unspecified deep veins of unspecified lower extremity: Secondary | ICD-10-CM

## 2013-05-28 DIAGNOSIS — Z5181 Encounter for therapeutic drug level monitoring: Secondary | ICD-10-CM

## 2013-05-28 DIAGNOSIS — Z7901 Long term (current) use of anticoagulants: Secondary | ICD-10-CM

## 2013-05-28 LAB — POCT INR: INR: 2.7

## 2013-06-17 ENCOUNTER — Telehealth: Payer: Self-pay | Admitting: Cardiology

## 2013-06-17 NOTE — Telephone Encounter (Signed)
Pt reports having dizziness since last Friday.  The dizziness is not constant, not positional, not different when him turns his head.  Reports he gets up like this and goes to bed like this.  He denies any CP.  States he is having some shortness of breath that may be a little worse over the past 2 months.  Reports SOB with walking a lot and with bending over to work.  He complains of feeling like his ears are full and that his "head doesn't feel quite right."  Advised to call PCP for evaluation but the pt would like to know what Dr Percival Spanish thinks and wants him to do.  Aware I will review with MD and call back

## 2013-06-17 NOTE — Telephone Encounter (Signed)
New problem     Per pt he is feeling dizzy all the time now for the pass week.  Pt would like to know if he should come in or what should he do about this.  Pt had a stint put in 2 moths ago.  Please give him a call back on his cell phone.

## 2013-06-17 NOTE — Telephone Encounter (Signed)
For the head fullness I would suggest a primary care appt.  For the other complaints I would be happy to see him at the next available appt.

## 2013-06-18 NOTE — Telephone Encounter (Signed)
Left message for pt that Dr Percival Spanish wants him to see his PCP for the head fullness.  Pt has been scheduled to see [facility] in f/u on 5/28.  Requested pt call back with further concerns or questions.

## 2013-06-25 ENCOUNTER — Ambulatory Visit (INDEPENDENT_AMBULATORY_CARE_PROVIDER_SITE_OTHER): Payer: PRIVATE HEALTH INSURANCE

## 2013-06-25 DIAGNOSIS — Z7901 Long term (current) use of anticoagulants: Secondary | ICD-10-CM

## 2013-06-25 DIAGNOSIS — I4891 Unspecified atrial fibrillation: Secondary | ICD-10-CM

## 2013-06-25 DIAGNOSIS — I82409 Acute embolism and thrombosis of unspecified deep veins of unspecified lower extremity: Secondary | ICD-10-CM

## 2013-06-25 DIAGNOSIS — I2119 ST elevation (STEMI) myocardial infarction involving other coronary artery of inferior wall: Secondary | ICD-10-CM

## 2013-06-25 DIAGNOSIS — I2699 Other pulmonary embolism without acute cor pulmonale: Secondary | ICD-10-CM

## 2013-06-25 DIAGNOSIS — Z5181 Encounter for therapeutic drug level monitoring: Secondary | ICD-10-CM

## 2013-06-25 LAB — POCT INR: INR: 2.8

## 2013-07-10 ENCOUNTER — Ambulatory Visit: Payer: PRIVATE HEALTH INSURANCE | Admitting: Cardiology

## 2013-07-17 ENCOUNTER — Encounter (INDEPENDENT_AMBULATORY_CARE_PROVIDER_SITE_OTHER): Payer: PRIVATE HEALTH INSURANCE

## 2013-07-17 ENCOUNTER — Ambulatory Visit (INDEPENDENT_AMBULATORY_CARE_PROVIDER_SITE_OTHER): Payer: PRIVATE HEALTH INSURANCE | Admitting: Physician Assistant

## 2013-07-17 ENCOUNTER — Encounter: Payer: Self-pay | Admitting: *Deleted

## 2013-07-17 ENCOUNTER — Encounter: Payer: Self-pay | Admitting: Physician Assistant

## 2013-07-17 VITALS — BP 130/70 | HR 60 | Ht 72.0 in | Wt 264.0 lb

## 2013-07-17 DIAGNOSIS — R42 Dizziness and giddiness: Secondary | ICD-10-CM

## 2013-07-17 DIAGNOSIS — E119 Type 2 diabetes mellitus without complications: Secondary | ICD-10-CM

## 2013-07-17 DIAGNOSIS — R079 Chest pain, unspecified: Secondary | ICD-10-CM

## 2013-07-17 DIAGNOSIS — I5032 Chronic diastolic (congestive) heart failure: Secondary | ICD-10-CM

## 2013-07-17 DIAGNOSIS — R0602 Shortness of breath: Secondary | ICD-10-CM

## 2013-07-17 DIAGNOSIS — E785 Hyperlipidemia, unspecified: Secondary | ICD-10-CM

## 2013-07-17 DIAGNOSIS — I4891 Unspecified atrial fibrillation: Secondary | ICD-10-CM

## 2013-07-17 DIAGNOSIS — I509 Heart failure, unspecified: Secondary | ICD-10-CM

## 2013-07-17 DIAGNOSIS — I2510 Atherosclerotic heart disease of native coronary artery without angina pectoris: Secondary | ICD-10-CM

## 2013-07-17 DIAGNOSIS — I2699 Other pulmonary embolism without acute cor pulmonale: Secondary | ICD-10-CM

## 2013-07-17 DIAGNOSIS — I10 Essential (primary) hypertension: Secondary | ICD-10-CM

## 2013-07-17 DIAGNOSIS — I5033 Acute on chronic diastolic (congestive) heart failure: Secondary | ICD-10-CM

## 2013-07-17 LAB — BASIC METABOLIC PANEL
BUN: 17 mg/dL (ref 6–23)
CO2: 28 mEq/L (ref 19–32)
Calcium: 9.4 mg/dL (ref 8.4–10.5)
Chloride: 99 mEq/L (ref 96–112)
Creatinine, Ser: 0.9 mg/dL (ref 0.4–1.5)
GFR: 89.62 mL/min (ref >60.00)
Glucose, Bld: 291 mg/dL — ABNORMAL HIGH (ref 70–99)
Potassium: 4.3 mEq/L (ref 3.5–5.1)
Sodium: 133 mEq/L — ABNORMAL LOW (ref 135–145)

## 2013-07-17 LAB — BRAIN NATRIURETIC PEPTIDE: Pro B Natriuretic peptide (BNP): 87 pg/mL (ref 0.0–100.0)

## 2013-07-17 MED ORDER — FUROSEMIDE 20 MG PO TABS: 20.0000 mg | ORAL_TABLET | Freq: Every day | ORAL | Status: DC

## 2013-07-17 MED ORDER — ISOSORBIDE MONONITRATE ER 30 MG PO TB24: 15.0000 mg | ORAL_TABLET | Freq: Every day | ORAL | Status: DC

## 2013-07-17 MED ORDER — ISOSORBIDE MONONITRATE ER 30 MG PO TB24
15.0000 mg | ORAL_TABLET | Freq: Every day | ORAL | Status: DC
Start: 2013-07-17 — End: 2013-07-17

## 2013-07-17 NOTE — Progress Notes (Signed)
Patient ID: Bobby Lindsey, male   DOB: Oct 31, 1955, 58 y.o.   MRN: [id number] E-Cardio 48 hour holter monitor applied to patient.

## 2013-07-17 NOTE — Progress Notes (Signed)
Cardiology Office Note    Date:  07/17/2013   ID:  Bobby Lindsey, DOB 04/08/1955, MRN [id number]  PCP:  Ramonita Lab  Cardiologist:  Dr. Minus Breeding     History of Present Illness: Bobby Lindsey is a 58 y.o. male with a hx of CAD s/p CABG 2002 and prior DES to RCA, recurrent pulmonary embolism/DVT, HTN, HL, DM, OSA.  He was admitted to [facility] 03/2013 with an Inferior STEMI.  Emergent LHC demonstrated patent S-OM1 and L-LAD, occluded S-Dx and S-PDA.  EF was 40% with inf wall motion abnormality.  Echo demonstrated normal LVF prior to d/c.  Culprit for STEMI was an occluded native CFX which was treated with DES.  Triple Rx was recommended x 1 month, then coumadin + Plavix.  He was previously on Xarelto, but this was changed to coumadin.  Patient has a hx of elevated CK.  Statin was resumed with plans to follow LFTs and CPK.  Hospitalization was c/b AFib and a/c diastolic CHF.  He was started on Amiodarone.  He developed Mobitz II and beta blocker was d/c'd.  He was d/c in NSR.  He stopped the Amiodarone on his own as he thought it was causing dyspnea.  Last seen here in 04/2013.    He called in recently with some dizziness.  He stopped going to cardiac rehab because of it.  He did see ENT and tells me that his workup was normal.  He has had some episodes of chest pain.  These are not necessarily brought on by exertion but did note them while at work.   CP described as tightness.  He had assoc dyspnea.  No nausea or diaphoresis.  No assoc radiation.  No syncope.  He notes chronic DOE.  He is NYHA 2-2b.  He has recently increased the incline of his bed.  He describes ? PND x 1.  He denies LE edema.      Recent Labs: 04/11/2013: ALT 13; Hemoglobin 13.3; TSH 4.287  04/18/2013: Pro B Natriuretic peptide (BNP) 191.0*  04/30/2013: Creatinine 1.03; Potassium 5.0   Wt Readings from Last 3 Encounters:  07/17/13 264 lb (119.75 kg)  04/18/13 262 lb (118.842 kg)  04/10/13 265 lb (120.203 kg)      Past Medical History  Diagnosis Date  . HTN (hypertension)     x 15 years  . Dyslipidemia   . Diabetes mellitus     a. A1C 10.7 in 03/2013.  Marland Kitchen Peripheral neuropathy   . Charcot's joint of knee     UNSPECIFIED AREA  . Pulmonary embolism   . Deep venous thrombosis     right lower extremity  . COPD (chronic obstructive pulmonary disease)   . Sleep apnea   . GERD (gastroesophageal reflux disease)   . Diastolic CHF     a. EF [id number] by cath, 55-60% during 03/2013 adm, required IV diuresis.  . Atrial fibrillation     a. Transient during 03/2013 admission.  . DVT, lower extremity, recurrent     a. Hx recurrent DVT per record.  . Pulmonary emboli     a. Hx PE per record.  . Bradycardia     a. Bradycardia/pauses/possible Mobitz II during 03/2013 adm. Not on BB due to this.  . Elevated CK     a. Pt has refused rheum workup in the past.  . Obesity   . Hx of cardiovascular stress test     a. Lexiscan Myoview (03/2010):  diaph atten vs inf scar,  no ischemia, EF 47%; Low Risk.  Marland Kitchen Hx of echocardiogram     a. Echo (04/08/13):  Mild LVH, EF 55-60%, restrictive physiology, severe LAE, mild reduced RVSF, mild RAE.  Marland Kitchen CAD (coronary artery disease)     a. s/p CABG 2002. b. Hx Cypher stent to the RCA. c. Inf-lat STEMI 03/2013:  LHC (04/05/13):  mLAD occluded, pD1 90, apical br of Dx occluded, CFX occluded, pOM1 90-95, RCA stents patent, diff RCA 30, S-Dx occluded, S-PDA occluded, S-OM1 40-50, L-LAD patent, EF 40% with inf HK.  PCI:  Promus (2.5 x 28) DES to mid to dist CFX.    Current Outpatient Prescriptions  Medication Sig Dispense Refill  . atorvastatin (LIPITOR) 40 MG tablet Take 1 tablet (40 mg total) by mouth every evening.  30 tablet  6  . Calcium 500 MG CHEW Chew 500 mg by mouth daily.      . clopidogrel (PLAVIX) 75 MG tablet Take 1 tablet (75 mg total) by mouth daily with breakfast.  30 tablet  6  . furosemide (LASIX) 20 MG tablet Take 1 tablet (20 mg total) by mouth daily.  30 tablet  11  .  insulin glargine (LANTUS) 100 UNIT/ML injection Inject 40 Units into the skin 2 (two) times daily.       . insulin lispro (HUMALOG KWIKPEN) 100 UNIT/ML injection Inject 20 Units into the skin 3 (three) times daily before meals. UAD      . losartan (COZAAR) 100 MG tablet Take 1 tablet (100 mg total) by mouth daily.  30 tablet  6  . metoprolol succinate (TOPROL-XL) 25 MG 24 hr tablet Take 0.5 tablets (12.5 mg total) by mouth daily.  30 tablet  11  . nitroGLYCERIN (NITROSTAT) 0.4 MG SL tablet Place 1 tablet (0.4 mg total) under the tongue every 5 (five) minutes as needed.  25 tablet  6  . warfarin (COUMADIN) 5 MG tablet Take 10 mg by mouth daily.       No current facility-administered medications for this visit.    Allergies:   Review of patient's allergies indicates no known allergies.   Social History:  The patient  reports that he has never smoked. He has never used smokeless tobacco. He reports that he drinks alcohol. He reports that he does not use illicit drugs.   Family History:  The patient's family history includes Heart disease in his mother.   ROS:  Please see the history of present illness.   He has a lot of L knee pain.  All other systems reviewed and negative.   PHYSICAL EXAM: VS:  BP 130/70  Pulse 60  Ht 6' (1.829 m)  Wt 264 lb (119.75 kg)  BMI 35.80 kg/m2 Well nourished, well developed, in no acute distress HEENT: normal Neck: ? elevated JVD Cardiac:  normal S1, S2; RR; no murmur Lungs:  clear to auscultation bilaterally, no wheezing, rhonchi or rales Abd: soft, nontender, no hepatomegaly Ext: tr-1+ bilateral LE edema(R>L) Skin: warm and dry Neuro:  CNs 2-12 intact, no focal abnormalities noted  EKG:   NSR, HR 60, normal axis, inf Q waves, no change from prior tracing   ASSESSMENT AND PLAN:  1. Chest Pain:  Somewhat atypical symptoms.  He has not had any CP in the last few weeks.  He did have an occluded S-Dx at his cath in 03/2013.  He did have 90% stenosis in the  D1.  Question if he may have some angina from this.  Will try Imdur  15 mg QD.  If he continues to have symptoms, consider stress myoview.   2. Dizziness:  He has had some bradycardia in the past.  Etiology of dizziness is not clear.  Will arrange 48 Holter to rule out bradyarrhythmias. 3. CAD:  Add nitrates as noted above.  He does not take any PDE-5 inhibitors.  He will continue on Plavix and coumadin, atorvastatin. Resume cardiac rehab.   4. Atrial Fibrillation:  No apparent recurrence. He is on long term coumadin.   5. Chronic Diastolic CHF:   He notes chronic dyspnea.  He appears to be mildly volume overloaded.  Will have him take increased dose of Lasix at 40 mg QD x 3 days.  Check BMET and BNP today.   6. Hypertension:  Controlled. 7. Hyperlipidemia:  Continue statin.    8. Diabetes Mellitus:  F/u with primary care. 9. Hx of Pulmonary Embolism:  Continue coumadin.  He has chronic dyspnea.  He denies hx of smoking.  Could consider referral to pulmonary in the future if dyspnea continues. 10. Disposition:  F/u with me or Dr. Minus Breeding in 3-4 weeks.    Signed, Bilaal, Desautel, MHS 07/17/2013 9:45 PM    [facility] [address], [place], [place]  [address] Phone: [phone]; Fax: [phone]

## 2013-07-17 NOTE — Patient Instructions (Signed)
INCREASE LASIX TO 40 MG DAILY FOR 3 DAYS THEN GO BACK TO YOUR 20 MG DAILY  START IMDUR 15 MG DAILY  LAB WORK TODAY; BMET, BNP  Your physician has recommended that you wear a 48 HOUR holter monitor. Holter monitors are medical devices that record the heart's electrical activity. Doctors most often use these monitors to diagnose arrhythmias. Arrhythmias are problems with the speed or rhythm of the heartbeat. The monitor is a small, portable device. You can wear one while you do your normal daily activities. This is usually used to diagnose what is causing palpitations/syncope (passing out).  Your physician recommends that you schedule a follow-up appointment in: [place] DR. [place] OFFICE

## 2013-07-18 ENCOUNTER — Telehealth: Payer: Self-pay | Admitting: *Deleted

## 2013-07-18 NOTE — Telephone Encounter (Signed)
lm on cell that identified pt by full name; K+ and kidney function normal, glucose elevated and to f/u w/PCP in refernce to elevated glucose. Lmom any questions tcb [phone].

## 2013-08-12 ENCOUNTER — Telehealth: Payer: Self-pay | Admitting: *Deleted

## 2013-08-12 NOTE — Telephone Encounter (Signed)
Left message on personal voicemail monitor results & reminder of appointment 08/13/13 with [name] [facility] Horton Chin RN

## 2013-08-13 ENCOUNTER — Ambulatory Visit: Payer: PRIVATE HEALTH INSURANCE | Admitting: Physician Assistant

## 2013-09-08 ENCOUNTER — Encounter: Payer: PRIVATE HEALTH INSURANCE | Admitting: Physician Assistant

## 2013-09-08 NOTE — Progress Notes (Deleted)
Cardiology Office Note    Date:  09/08/2013   ID:  Bobby Lindsey, DOB 05-Nov-1955, MRN [id number]  PCP:  Ramon[facility] Lab  Cardiologist:  Dr. Minus Breeding     History of Present Illness: Bobby Lindsey is a 58 y.o. male with a hx of CAD s/p CABG 2002 and prior DES to RCA, recurrent pulmonary embolism/DVT, HTN, HL, DM, OSA.  He was admitted to [facility] 03/2013 with an Inferior STEMI.  Emergent LHC demonstrated patent S-OM1 and L-LAD, occluded S-Dx and S-PDA.  EF was 40% with inf wall motion abnormality.  Echo demonstrated normal LVF prior to d/c.  Culprit for STEMI was an occluded native CFX which was treated with DES.  Triple Rx was recommended x 1 month, then coumadin + Plavix.  He was previously on Xarelto, but this was changed to coumadin.  Patient has a hx of elevated CK.  Statin was resumed with plans to follow LFTs and CPK.  Hospitalization was c/b AFib and a/c diastolic CHF.  He was started on Amiodarone.  He developed Mobitz II and beta blocker was d/c'd.  He was d/c in NSR.  He stopped the Amiodarone on his own as he thought it was causing dyspnea.    Last seen 07/17/13.  He noted some chest pain.  Med Rx was adjusted with the addition of Imdur.  Holter was arranged b/c of symptoms of dizziness.  This demonstrated NSR and no significant arrhythmia.  He returns for follow up.  ***   Recent Labs: 04/11/2013: ALT 13; Hemoglobin 13.3; TSH 4.287  07/17/2013: Creatinine 0.9; Potassium 4.3; Pro B Natriuretic peptide (BNP) 87.0   Wt Readings from Last 3 Encounters:  07/17/13 264 lb (119.75 kg)  04/18/13 262 lb (118.842 kg)  04/10/13 265 lb (120.203 kg)     Past Medical History  Diagnosis Date  . HTN (hypertension)     x 15 years  . Dyslipidemia   . Diabetes mellitus     a. A1C 10.7 in 03/2013.  Marland Kitchen Peripheral neuropathy   . Charcot's joint of knee     UNSPECIFIED AREA  . Pulmonary embolism   . Deep venous thrombosis     right lower extremity  . COPD (chronic obstructive  pulmonary disease)   . Sleep apnea   . GERD (gastroesophageal reflux disease)   . Diastolic CHF     a. EF [id number] by cath, 55-60% during 03/2013 adm, required IV diuresis.  . Atrial fibrillation     a. Transient during 03/2013 admission.  . DVT, lower extremity, recurrent     a. Hx recurrent DVT per record.  . Pulmonary emboli     a. Hx PE per record.  . Bradycardia     a. Bradycardia/pauses/possible Mobitz II during 03/2013 adm. Not on BB due to this.  . Elevated CK     a. Pt has refused rheum workup in the past.  . Obesity   . Hx of cardiovascular stress test     a. Lexiscan Myoview (03/2010):  diaph atten vs inf scar, no ischemia, EF 47%; Low Risk.  Marland Kitchen Hx of echocardiogram     a. Echo (04/08/13):  Mild LVH, EF 55-60%, restrictive physiology, severe LAE, mild reduced RVSF, mild RAE.  Marland Kitchen CAD (coronary artery disease)     a. s/p CABG 2002. b. Hx Cypher stent to the RCA. c. Inf-lat STEMI 03/2013:  LHC (04/05/13):  mLAD occluded, pD1 90, apical br of Dx occluded, CFX occluded, pOM1 90-95, RCA  stents patent, diff RCA 30, S-Dx occluded, S-PDA occluded, S-OM1 40-50, L-LAD patent, EF 40% with inf HK.  PCI:  Promus (2.5 x 28) DES to mid to dist CFX.    Current Outpatient Prescriptions  Medication Sig Dispense Refill  . atorvastatin (LIPITOR) 40 MG tablet Take 1 tablet (40 mg total) by mouth every evening.  30 tablet  6  . Calcium 500 MG CHEW Chew 500 mg by mouth daily.      . clopidogrel (PLAVIX) 75 MG tablet Take 1 tablet (75 mg total) by mouth daily with breakfast.  30 tablet  6  . furosemide (LASIX) 20 MG tablet Take 1 tablet (20 mg total) by mouth daily.  90 tablet  3  . insulin glargine (LANTUS) 100 UNIT/ML injection Inject 40 Units into the skin 2 (two) times daily.       . insulin lispro (HUMALOG KWIKPEN) 100 UNIT/ML injection Inject 20 Units into the skin 3 (three) times daily before meals. UAD      . isosorbide mononitrate (IMDUR) 30 MG 24 hr tablet Take 0.5 tablets (15 mg total) by mouth daily.   90 tablet  3  . losartan (COZAAR) 100 MG tablet Take 1 tablet (100 mg total) by mouth daily.  30 tablet  6  . metoprolol succinate (TOPROL-XL) 25 MG 24 hr tablet Take 0.5 tablets (12.5 mg total) by mouth daily.  30 tablet  11  . nitroGLYCERIN (NITROSTAT) 0.4 MG SL tablet Place 1 tablet (0.4 mg total) under the tongue every 5 (five) minutes as needed.  25 tablet  6  . warfarin (COUMADIN) 5 MG tablet Take 10 mg by mouth daily.       No current facility-administered medications for this visit.    Allergies:   Review of patient's allergies indicates no known allergies.   Social History:  The patient  reports that he has never smoked. He has never used smokeless tobacco. He reports that he drinks alcohol. He reports that he does not use illicit drugs.   Family History:  The patient's family history includes Heart disease in his mother.   ROS:  Please see the history of present illness.   ***  All other systems reviewed and negative.   PHYSICAL EXAM: VS:  There were no vitals taken for this visit. Well nourished, well developed, in no acute distress HEENT: normal Neck: *** JVD Cardiac:  normal S1, S2; ***; *** murmur Lungs:  ***clear to auscultation bilaterally, no wheezing, rhonchi or rales Abd: soft, nontender, no hepatomegaly Ext: *** edema(R>L) Skin: warm and dry Neuro:  CNs 2-12 intact, no focal abnormalities noted  EKG:   ***   ASSESSMENT AND PLAN:  1. Chest Pain:  *** 2. Dizziness:  ***48 Holter***. 3. CAD:  ***Continue on Plavix and coumadin, atorvastatin.  4. Atrial Fibrillation:  ***coumadin.   5. Chronic Diastolic CHF:   ***   6. Hypertension:  ***Controlled. 7. Hyperlipidemia:  ***Continue statin.    8. Diabetes Mellitus:  ***F/u with primary care. 9. Hx of Pulmonary Embolism:  ***Continue coumadin.  He has chronic dyspnea.  He denies hx of smoking.  Could consider referral to pulmonary in the future if dyspnea continues. 10. Disposition:  F/u with ***.     Signed, Hisham, Jenson, MHS 09/08/2013 2:25 PM    [facility] [address], [place], [place]  [address] Phone: [phone]; Fax: [phone]

## 2013-09-08 NOTE — Progress Notes (Signed)
This encounter was created in error - please disregard.

## 2013-10-01 ENCOUNTER — Ambulatory Visit (INDEPENDENT_AMBULATORY_CARE_PROVIDER_SITE_OTHER): Payer: PRIVATE HEALTH INSURANCE

## 2013-10-01 DIAGNOSIS — I82409 Acute embolism and thrombosis of unspecified deep veins of unspecified lower extremity: Secondary | ICD-10-CM

## 2013-10-01 DIAGNOSIS — I4891 Unspecified atrial fibrillation: Secondary | ICD-10-CM

## 2013-10-01 DIAGNOSIS — I2119 ST elevation (STEMI) myocardial infarction involving other coronary artery of inferior wall: Secondary | ICD-10-CM

## 2013-10-01 DIAGNOSIS — Z7901 Long term (current) use of anticoagulants: Secondary | ICD-10-CM

## 2013-10-01 DIAGNOSIS — I2699 Other pulmonary embolism without acute cor pulmonale: Secondary | ICD-10-CM

## 2013-10-01 DIAGNOSIS — Z5181 Encounter for therapeutic drug level monitoring: Secondary | ICD-10-CM

## 2013-10-01 LAB — POCT INR: INR: 2.3

## 2013-10-21 ENCOUNTER — Encounter: Payer: Self-pay | Admitting: Physician Assistant

## 2013-10-21 ENCOUNTER — Ambulatory Visit (INDEPENDENT_AMBULATORY_CARE_PROVIDER_SITE_OTHER): Payer: PRIVATE HEALTH INSURANCE | Admitting: Physician Assistant

## 2013-10-21 ENCOUNTER — Ambulatory Visit: Payer: PRIVATE HEALTH INSURANCE | Admitting: Physician Assistant

## 2013-10-21 VITALS — BP 142/79 | HR 60 | Ht 72.0 in | Wt 269.0 lb

## 2013-10-21 DIAGNOSIS — G4733 Obstructive sleep apnea (adult) (pediatric): Secondary | ICD-10-CM

## 2013-10-21 DIAGNOSIS — I5032 Chronic diastolic (congestive) heart failure: Secondary | ICD-10-CM

## 2013-10-21 DIAGNOSIS — R0602 Shortness of breath: Secondary | ICD-10-CM

## 2013-10-21 DIAGNOSIS — I10 Essential (primary) hypertension: Secondary | ICD-10-CM

## 2013-10-21 DIAGNOSIS — I2699 Other pulmonary embolism without acute cor pulmonale: Secondary | ICD-10-CM

## 2013-10-21 DIAGNOSIS — I2510 Atherosclerotic heart disease of native coronary artery without angina pectoris: Secondary | ICD-10-CM

## 2013-10-21 DIAGNOSIS — K219 Gastro-esophageal reflux disease without esophagitis: Secondary | ICD-10-CM

## 2013-10-21 DIAGNOSIS — E785 Hyperlipidemia, unspecified: Secondary | ICD-10-CM

## 2013-10-21 DIAGNOSIS — I4891 Unspecified atrial fibrillation: Secondary | ICD-10-CM

## 2013-10-21 MED ORDER — CLOPIDOGREL BISULFATE 75 MG PO TABS: 75.0000 mg | ORAL_TABLET | Freq: Every day | ORAL | Status: DC

## 2013-10-21 MED ORDER — PANTOPRAZOLE SODIUM 40 MG PO TBEC: 40.0000 mg | DELAYED_RELEASE_TABLET | Freq: Every day | ORAL | Status: DC

## 2013-10-21 MED ORDER — ISOSORBIDE MONONITRATE ER 30 MG PO TB24: 30.0000 mg | ORAL_TABLET | Freq: Every day | ORAL | Status: DC

## 2013-10-21 NOTE — Progress Notes (Signed)
Cardiology Office Note    Date:  10/21/2013   ID:  [name], DOB 09/22/1955, MRN [id number]  PCP:  [facility]  Cardiologist:  Dr. Minus Breeding     History of Present Illness: Bobby Lindsey is a 58 y.o. male with a hx of CAD s/p CABG 2002 and prior DES to RCA, recurrent pulmonary embolism/DVT, HTN, HL, DM, OSA.  He was admitted to [facility] 03/2013 with an Inferior STEMI.  Emergent LHC demonstrated patent S-OM1 and L-LAD, occluded S-Dx and S-PDA.  EF was 40% with inf wall motion abnormality.  Echo demonstrated normal LVF prior to d/c.  Culprit for STEMI was an occluded native CFX which was treated with DES.  Triple Rx was recommended x 1 month, then coumadin + Plavix.  He was previously on Xarelto, but this was changed to coumadin.  Patient has a hx of elevated CK.  Statin was resumed with plans to follow LFTs and CPK.  Hospitalization was c/b AFib and a/c diastolic CHF.  He was started on Amiodarone.  He developed Mobitz II and beta blocker was d/c'd.  He was d/c in NSR.  He stopped the Amiodarone on his own as he thought it was causing dyspnea.  Last seen here in 04/2013.    Last seen here 07/2013.  He had some chest pain and I added a long acting nitrate to see if this would help.  He was mildly volume overloaded and I adjusted his diuretic.  He also complained of dizziness and a 48 Hr Holter demonstrated NSR, rare PVCs but no sustained arrhythmia.   He returns for FU.  He continues to have chronic dyspnea.  He is NYHA 2b-3.  He continues to work in [employer] without limitations.  He did have an episode of chest pain a month ago that resolved with NTG x 1.  He has not had symptoms since.  He denies exertional chest pain.  He denies orthopnea, PND.  He has chronic LE edema without change.  He continues to have occasional dizziness.  He denies syncope or near syncope.  He apparently has seen ENT in the past and was not felt to have vertigo.  He if fatigued.  He does not adhere to wearing  CPAP.     Recent Labs: 04/11/2013: ALT 13; Hemoglobin 13.3; TSH 4.287  07/17/2013: Creatinine 0.9; Potassium 4.3; Pro B Natriuretic peptide (BNP) 87.0   Wt Readings from Last 3 Encounters:  10/21/13 269 lb (122.018 kg)  07/17/13 264 lb (119.75 kg)  04/18/13 262 lb (118.842 kg)     Past Medical History  Diagnosis Date  . HTN (hypertension)     x 15 years  . Dyslipidemia   . Diabetes mellitus     a. A1C 10.7 in 03/2013.  Marland Kitchen Peripheral neuropathy   . Charcot's joint of knee     UNSPECIFIED AREA  . Pulmonary embolism   . Deep venous thrombosis     right lower extremity  . COPD (chronic obstructive pulmonary disease)   . Sleep apnea   . GERD (gastroesophageal reflux disease)   . Diastolic CHF     a. EF [id number] by cath, 55-60% during 03/2013 adm, required IV diuresis.  . Atrial fibrillation     a. Transient during 03/2013 admission.  . DVT, lower extremity, recurrent     a. Hx recurrent DVT per record.  . Pulmonary emboli     a. Hx PE per record.  . Bradycardia  a. Bradycardia/pauses/possible Mobitz II during 03/2013 adm. Not on BB due to this.  . Elevated CK     a. Pt has refused rheum workup in the past.  . Obesity   . Hx of cardiovascular stress test     a. Lexiscan Myoview (03/2010):  diaph atten vs inf scar, no ischemia, EF 47%; Low Risk.  Marland Kitchen Hx of echocardiogram     a. Echo (04/08/13):  Mild LVH, EF 55-60%, restrictive physiology, severe LAE, mild reduced RVSF, mild RAE.  Marland Kitchen CAD (coronary artery disease)     a. s/p CABG 2002. b. Hx Cypher stent to the RCA. c. Inf-lat STEMI 03/2013:  LHC (04/05/13):  mLAD occluded, pD1 90, apical br of Dx occluded, CFX occluded, pOM1 90-95, RCA stents patent, diff RCA 30, S-Dx occluded, S-PDA occluded, S-OM1 40-50, L-LAD patent, EF 40% with inf HK.  PCI:  Promus (2.5 x 28) DES to mid to dist CFX.    Current Outpatient Prescriptions  Medication Sig Dispense Refill  . atorvastatin (LIPITOR) 40 MG tablet Take 1 tablet (40 mg total) by mouth every  evening.  30 tablet  6  . furosemide (LASIX) 20 MG tablet Take 1 tablet (20 mg total) by mouth daily.  90 tablet  3  . insulin glargine (LANTUS) 100 UNIT/ML injection Inject 40 Units into the skin 2 (two) times daily.       . insulin lispro (HUMALOG KWIKPEN) 100 UNIT/ML injection Inject 20 Units into the skin 3 (three) times daily before meals. UAD      . isosorbide mononitrate (IMDUR) 30 MG 24 hr tablet Take 0.5 tablets (15 mg total) by mouth daily.  90 tablet  3  . losartan (COZAAR) 100 MG tablet Take 1 tablet (100 mg total) by mouth daily.  30 tablet  6  . metoprolol succinate (TOPROL-XL) 25 MG 24 hr tablet Take 0.5 tablets (12.5 mg total) by mouth daily.  30 tablet  11  . nitroGLYCERIN (NITROSTAT) 0.4 MG SL tablet Place 1 tablet (0.4 mg total) under the tongue every 5 (five) minutes as needed.  25 tablet  6  . warfarin (COUMADIN) 5 MG tablet Take 10 mg by mouth daily.       No current facility-administered medications for this visit.    Allergies:   Review of patient's allergies indicates no known allergies.   Social History:  The patient  reports that he has never smoked. He has never used smokeless tobacco. He reports that he drinks alcohol. He reports that he does not use illicit drugs.   Family History:  The patient's family history includes Cancer in his father; Heart disease in his mother; Hypertension in his mother and sister. There is no history of Heart attack.   ROS:  Please see the history of present illness.  He notes a lot of indigestion.  He was previously on Nexium.  All other systems reviewed and negative.   PHYSICAL EXAM: VS:  BP 142/79  Pulse 60  Ht 6' (1.829 m)  Wt 269 lb (122.018 kg)  BMI 36.48 kg/m2 Well nourished, well developed, in no acute distress HEENT: normal Neck: no JVD Cardiac:  normal S1, S2; RR; no murmur Lungs:  clear to auscultation bilaterally, no wheezing, rhonchi or rales Abd: soft, nontender, no hepatomegaly Ext: tr-1+ bilateral LE edema    Skin: warm and dry Neuro:  CNs 2-12 intact, no focal abnormalities noted  EKG:   NSR, HR 60, normal axis, inf Q waves, no change from prior  tracing   ASSESSMENT AND PLAN:  1. CAD:  He continues to have dyspnea with exertion that is chronic and stable.  He has rare chest pain.  His LHC in 03/2013 demonstrated an occluded S-Dx with 90% stenosis in the D1 and an occluded apical branch of the Dx.  He may have some symptoms from this.  I suspect his symptoms of DOE are multifactorial and related to CAD, diastolic CHF, obesity, OSA.  I will adjust his Isosorbide to 30 mg QD to see if this helps.  He is off of his Plavix for unclear reasons.  I have asked him to resume this.  Continue low dose beta blocker, statin.   2. Sleep Apnea:  I have encouraged him to resume CPAP.  If he is unsuccessful, we could refer him to pulmonary for further recommendations, if any.   3. Dizziness:  Recent Holter without significant arrhythmia. Etiology not clear.  No further cardiac workup.    4. Atrial Fibrillation:  No apparent recurrence. He is on long term coumadin.   5. Chronic Diastolic CHF:   Volume appears stable.  Given his DOE, I will check a BMET and BNP today. If BNP is elevated, I will increase his Lasix.    6. Hypertension:  Borderline elevated.  I will adjust his Isosorbide as noted.  7. Hyperlipidemia:  Continue statin.    8. Diabetes Mellitus:  F/u with primary care. 9. Hx of Pulmonary Embolism:  Continue coumadin.  He had massive bilateral pulmonary emboli by CT in 2009.  If dyspnea continues, consider referral to pulmonary.   10. GERD:  He is having increasing indigestion.  I will start him on Protonix 40 mg QD.  FU with primary care.   Disposition:  FU with Dr. Minus Breeding in 3 mos.    Signed, Fortune, Bandstra, MHS 10/21/2013 3:51 PM    [facility] [address], [place], [place]  [address] Phone: [phone]; Fax: [phone]

## 2013-10-21 NOTE — Patient Instructions (Addendum)
Please check your medications when you get home.  You should be on Plavix.  If you are not taking Plavix, please call us so that we can get this refilled.  LAB WORK TODAY; BMET, BNP  Your physician has recommended you make the following change in your medication:  1. INCREASE IMDUR TO 30 MG DAILY; REFILL SENT IN TODAY 2. START PROTONIX 40 MG 1 CAP DAILY; NEW RX SENT IN TODAY 3. A REFILL FOR PLAVIX WAS SENT IN TODAY  Your physician recommends that you schedule a follow-up appointment in: 3 MONTHS WITH DR. HOCHREIN

## 2013-10-22 LAB — BASIC METABOLIC PANEL
BUN: 17 mg/dL (ref 6–23)
CO2: 28 mEq/L (ref 19–32)
Calcium: 9.2 mg/dL (ref 8.4–10.5)
Chloride: 99 mEq/L (ref 96–112)
Creatinine, Ser: 1 mg/dL (ref 0.4–1.5)
GFR: 79.49 mL/min (ref >60.00)
Glucose, Bld: 331 mg/dL — ABNORMAL HIGH (ref 70–99)
Potassium: 4.2 mEq/L (ref 3.5–5.1)
Sodium: 134 mEq/L — ABNORMAL LOW (ref 135–145)

## 2013-10-22 LAB — BRAIN NATRIURETIC PEPTIDE: Pro B Natriuretic peptide (BNP): 126 pg/mL — ABNORMAL HIGH (ref 0.0–100.0)

## 2013-10-23 ENCOUNTER — Telehealth: Payer: Self-pay | Admitting: *Deleted

## 2013-10-23 NOTE — Telephone Encounter (Signed)
lmptcb about lab results also left detailed message on phone that indentified pt by full name. I will fax results to PCP today.

## 2013-10-29 ENCOUNTER — Ambulatory Visit (INDEPENDENT_AMBULATORY_CARE_PROVIDER_SITE_OTHER): Payer: PRIVATE HEALTH INSURANCE

## 2013-10-29 DIAGNOSIS — Z7901 Long term (current) use of anticoagulants: Secondary | ICD-10-CM

## 2013-10-29 DIAGNOSIS — Z5181 Encounter for therapeutic drug level monitoring: Secondary | ICD-10-CM

## 2013-10-29 DIAGNOSIS — I2699 Other pulmonary embolism without acute cor pulmonale: Secondary | ICD-10-CM

## 2013-10-29 DIAGNOSIS — I4891 Unspecified atrial fibrillation: Secondary | ICD-10-CM

## 2013-10-29 DIAGNOSIS — I82409 Acute embolism and thrombosis of unspecified deep veins of unspecified lower extremity: Secondary | ICD-10-CM

## 2013-10-29 DIAGNOSIS — I2119 ST elevation (STEMI) myocardial infarction involving other coronary artery of inferior wall: Secondary | ICD-10-CM

## 2013-10-29 LAB — POCT INR: INR: 2.2

## 2013-11-05 ENCOUNTER — Other Ambulatory Visit: Payer: Self-pay | Admitting: *Deleted

## 2013-11-05 DIAGNOSIS — I10 Essential (primary) hypertension: Secondary | ICD-10-CM

## 2013-11-05 MED ORDER — WARFARIN SODIUM 5 MG PO TABS: 5.0000 mg | ORAL_TABLET | ORAL | Status: DC

## 2013-11-05 MED ORDER — LOSARTAN POTASSIUM 100 MG PO TABS: 100.0000 mg | ORAL_TABLET | Freq: Every day | ORAL | Status: DC

## 2013-11-24 ENCOUNTER — Other Ambulatory Visit: Payer: Self-pay

## 2013-11-24 MED ORDER — ATORVASTATIN CALCIUM 40 MG PO TABS: 40.0000 mg | ORAL_TABLET | Freq: Every evening | ORAL | Status: DC

## 2013-11-26 ENCOUNTER — Ambulatory Visit (INDEPENDENT_AMBULATORY_CARE_PROVIDER_SITE_OTHER): Payer: PRIVATE HEALTH INSURANCE

## 2013-11-26 DIAGNOSIS — Z5181 Encounter for therapeutic drug level monitoring: Secondary | ICD-10-CM

## 2013-11-26 DIAGNOSIS — I2699 Other pulmonary embolism without acute cor pulmonale: Secondary | ICD-10-CM

## 2013-11-26 DIAGNOSIS — I4891 Unspecified atrial fibrillation: Secondary | ICD-10-CM

## 2013-11-26 DIAGNOSIS — I2119 ST elevation (STEMI) myocardial infarction involving other coronary artery of inferior wall: Secondary | ICD-10-CM

## 2013-11-26 DIAGNOSIS — Z7901 Long term (current) use of anticoagulants: Secondary | ICD-10-CM

## 2013-11-26 DIAGNOSIS — I82409 Acute embolism and thrombosis of unspecified deep veins of unspecified lower extremity: Secondary | ICD-10-CM

## 2013-11-26 LAB — POCT INR: INR: 2.3

## 2013-12-09 ENCOUNTER — Telehealth: Payer: Self-pay | Admitting: Cardiology

## 2013-12-09 MED ORDER — WARFARIN SODIUM 5 MG PO TABS: 5.0000 mg | ORAL_TABLET | ORAL | Status: DC

## 2013-12-09 NOTE — Telephone Encounter (Signed)
Warfarin refill sent in 11/05/13 for 60 tablets with 3 refills.  Called pharmacy they did not put refills on script when entered into computer.  Gave verbal order for Warfarin 5mg  tablets #60 with 3 refills Take as directed by Coumadin clinic.

## 2013-12-09 NOTE — Telephone Encounter (Signed)
New message     Refill warfarin---[facility] in [place]----pt has been off medication for 2 days because he was out.  Please call pt and let him know if the dosage is the same

## 2013-12-16 DIAGNOSIS — R053 Chronic cough: Secondary | ICD-10-CM | POA: Insufficient documentation

## 2013-12-16 DIAGNOSIS — A5211 Tabes dorsalis: Secondary | ICD-10-CM | POA: Insufficient documentation

## 2013-12-16 DIAGNOSIS — J449 Chronic obstructive pulmonary disease, unspecified: Secondary | ICD-10-CM | POA: Diagnosis present

## 2013-12-16 DIAGNOSIS — R05 Cough: Secondary | ICD-10-CM | POA: Insufficient documentation

## 2014-01-20 ENCOUNTER — Ambulatory Visit (INDEPENDENT_AMBULATORY_CARE_PROVIDER_SITE_OTHER): Payer: PRIVATE HEALTH INSURANCE | Admitting: Cardiology

## 2014-01-20 ENCOUNTER — Encounter: Payer: Self-pay | Admitting: Cardiology

## 2014-01-20 VITALS — BP 130/70 | HR 60 | Ht 72.0 in | Wt 273.0 lb

## 2014-01-20 DIAGNOSIS — M791 Myalgia, unspecified site: Secondary | ICD-10-CM

## 2014-01-20 DIAGNOSIS — E785 Hyperlipidemia, unspecified: Secondary | ICD-10-CM

## 2014-01-20 DIAGNOSIS — R0789 Other chest pain: Secondary | ICD-10-CM

## 2014-01-20 NOTE — Patient Instructions (Signed)
Your physician recommends that you schedule a follow-up appointment in: 2 months with [name]  We are ordering an echo for you to get done  We are ordering labs for you to get  Dr. Percival Spanish wants you to see a rhumatologist in [place]

## 2014-01-20 NOTE — Progress Notes (Signed)
HPI The patient presents for followup of muscle aches and CAD.  He was hospitalized in February with inferior myocardial infarction. His EF at that time was slightly reduced at 40% on cath.  He did however improve to normal prior to discharge. The culprit for his ST elevation myocardial infarction was occluded native circumflex which was treated with a drug-eluting stent. He did have atrial fibrillation and diastolic heart failure. He was started on amiodarone but this was stopped because of Mobitz type II heart block. He was backing sinus rhythm at discharge.   Of note persistently elevated CKs but has refused rheumatology followup in the past.  He has had some chest discomfort that is unlike his presenting angina.   This has been fleeting. It happens sporadically. He's unable to bring on any activities. He describes it as a burning discomfort. He does have some shortness of breath actually was started on Imdur at the last office visit for this. He's not describing any new shortness of breath, PND or orthopnea. He's had no new palpitations, presyncope or syncope. His biggest issue continues to be muscle aches which have been persistent over the years.    No Known Allergies  Current Outpatient Prescriptions  Medication Sig Dispense Refill  . atorvastatin (LIPITOR) 40 MG tablet Take 1 tablet (40 mg total) by mouth every evening. 30 tablet 1  . clopidogrel (PLAVIX) 75 MG tablet Take 1 tablet (75 mg total) by mouth daily. 90 tablet 3  . furosemide (LASIX) 20 MG tablet Take 1 tablet (20 mg total) by mouth daily. 90 tablet 3  . insulin glargine (LANTUS) 100 UNIT/ML injection Inject 40 Units into the skin 2 (two) times daily.     . insulin lispro (HUMALOG KWIKPEN) 100 UNIT/ML injection Inject 20 Units into the skin 3 (three) times daily before meals. UAD    . isosorbide mononitrate (IMDUR) 30 MG 24 hr tablet Take 1 tablet (30 mg total) by mouth daily. 90 tablet 3  . losartan (COZAAR) 100 MG tablet Take  1 tablet (100 mg total) by mouth daily. 30 tablet 3  . metoprolol succinate (TOPROL-XL) 25 MG 24 hr tablet Take 0.5 tablets (12.5 mg total) by mouth daily. 30 tablet 11  . nitroGLYCERIN (NITROSTAT) 0.4 MG SL tablet Place 1 tablet (0.4 mg total) under the tongue every 5 (five) minutes as needed. 25 tablet 6  . pantoprazole (PROTONIX) 40 MG tablet Take 1 tablet (40 mg total) by mouth daily. 30 tablet 3  . warfarin (COUMADIN) 5 MG tablet Take 1 tablet (5 mg total) by mouth as directed. 60 tablet 3   No current facility-administered medications for this visit.    Past Medical History  Diagnosis Date  . HTN (hypertension)     x 15 years  . Dyslipidemia   . Diabetes mellitus     a. A1C 10.7 in 03/2013.  Marland Kitchen Peripheral neuropathy   . Charcot's joint of knee     UNSPECIFIED AREA  . Pulmonary embolism   . Deep venous thrombosis     right lower extremity  . COPD (chronic obstructive pulmonary disease)   . Sleep apnea   . GERD (gastroesophageal reflux disease)   . Diastolic CHF     a. EF [id number] by cath, 55-60% during 03/2013 adm, required IV diuresis.  . Atrial fibrillation     a. Transient during 03/2013 admission.  . DVT, lower extremity, recurrent     a. Hx recurrent DVT per record.  . Pulmonary emboli  a. Hx PE per record.  . Bradycardia     a. Bradycardia/pauses/possible Mobitz II during 03/2013 adm. Not on BB due to this.  . Elevated CK     a. Pt has refused rheum workup in the past.  . Obesity   . Hx of cardiovascular stress test     a. Lexiscan Myoview (03/2010):  diaph atten vs inf scar, no ischemia, EF 47%; Low Risk.  Marland Kitchen Hx of echocardiogram     a. Echo (04/08/13):  Mild LVH, EF 55-60%, restrictive physiology, severe LAE, mild reduced RVSF, mild RAE.  Marland Kitchen CAD (coronary artery disease)     a. s/p CABG 2002. b. Hx Cypher stent to the RCA. c. Inf-lat STEMI 03/2013:  LHC (04/05/13):  mLAD occluded, pD1 90, apical br of Dx occluded, CFX occluded, pOM1 90-95, RCA stents patent, diff RCA 30,  S-Dx occluded, S-PDA occluded, S-OM1 40-50, L-LAD patent, EF 40% with inf HK.  PCI:  Promus (2.5 x 28) DES to mid to dist CFX.    Past Surgical History  Procedure Laterality Date  . Left knee surgery    . Coronary artery bypass graft      4 time since 2002  . Coronary angioplasty with stent placement      ROS:  Erectile dysfunction.  Otherwise as stated in the HPI and negative for all other systems.   PHYSICAL EXAM BP 130/70 mmHg  Pulse 60  Ht 6' (1.829 m)  Wt 273 lb (123.832 kg)  BMI 37.02 kg/m2 GENERAL:  Well appearing NECK:  No jugular venous distention, waveform within normal limits, carotid upstroke brisk and symmetric, no bruits, no thyromegaly LUNGS:  Clear to auscultation bilaterally BACK:  No CVA tenderness CHEST:  Well healed sternotomy scar. HEART:  PMI not displaced or sustained,S1 and S2 within normal limits, no S3, no S4, no clicks, no rubs, no murmurs ABD:  Flat, positive bowel sounds normal in frequency in pitch, no bruits, no rebound, no guarding, no midline pulsatile mass, no hepatomegaly, no splenomegaly, obese EXT:  2 plus pulses throughout, no cyanosis no clubbing, charcot joints,  Mild bilateral lower extremity edema NEURO:  Nonfocal   ASSESSMENT AND PLAN  Muscle pain -  He still has these. He's had persistently elevated CKs and is now back on a statin. I have tried to send him to a rheumatologist and he has failed to comply with this suggestion and I made the suggestion again.   He says he will see somebody in [place].  I will see if there is anybody there and try to make this arrangement.  CAD -  He  He does have some ongoing atypical chest pain. This is not like his previous angina. At this point no further cardiovascular testing is planned but he is instructed to call me if he has any increasing symptoms.  HYPERTENSION -  The blood pressure is at target. No change in medications is indicated. We will continue with therapeutic lifestyle changes  (TLC).  DYSLIPIDEMIA -   I will send him for a fasting lipid profile.  DVT -  He has had massive clot burden in the past with pulmonary embolism. This summer he had some lower extremity thrombus as described previously.  I have elected to continue him on anticoagulation because of recurrent thrombus.   DIABETES - I will defer to Dr. Caryl Comes.   Unfortunately the patient has not been compliant with lifestyle changes and his sugars have never been well controlled.  Lab Results  Component Value Date   HGBA1C 10.7* 04/06/2013

## 2014-01-22 ENCOUNTER — Encounter (HOSPITAL_COMMUNITY): Payer: Self-pay | Admitting: Cardiology

## 2014-01-22 ENCOUNTER — Telehealth: Payer: Self-pay | Admitting: Cardiology

## 2014-01-29 ENCOUNTER — Telehealth: Payer: Self-pay | Admitting: *Deleted

## 2014-01-29 ENCOUNTER — Ambulatory Visit (HOSPITAL_COMMUNITY)
Admission: RE | Admit: 2014-01-29 | Discharge: 2014-01-29 | Disposition: A | Discharge status: Home / Self Care | Payer: PRIVATE HEALTH INSURANCE | Source: Ambulatory Visit | Attending: Cardiovascular Disease | Admitting: Cardiovascular Disease

## 2014-01-29 ENCOUNTER — Ambulatory Visit (INDEPENDENT_AMBULATORY_CARE_PROVIDER_SITE_OTHER): Payer: PRIVATE HEALTH INSURANCE | Admitting: *Deleted

## 2014-01-29 VITALS — BP 130/60 | HR 68

## 2014-01-29 DIAGNOSIS — I2119 ST elevation (STEMI) myocardial infarction involving other coronary artery of inferior wall: Secondary | ICD-10-CM

## 2014-01-29 DIAGNOSIS — I4891 Unspecified atrial fibrillation: Secondary | ICD-10-CM

## 2014-01-29 DIAGNOSIS — R079 Chest pain, unspecified: Secondary | ICD-10-CM | POA: Insufficient documentation

## 2014-01-29 DIAGNOSIS — I369 Nonrheumatic tricuspid valve disorder, unspecified: Secondary | ICD-10-CM

## 2014-01-29 DIAGNOSIS — R0789 Other chest pain: Secondary | ICD-10-CM

## 2014-01-29 DIAGNOSIS — I2699 Other pulmonary embolism without acute cor pulmonale: Secondary | ICD-10-CM

## 2014-01-29 DIAGNOSIS — Z7901 Long term (current) use of anticoagulants: Secondary | ICD-10-CM

## 2014-01-29 DIAGNOSIS — I82409 Acute embolism and thrombosis of unspecified deep veins of unspecified lower extremity: Secondary | ICD-10-CM

## 2014-01-29 DIAGNOSIS — R0602 Shortness of breath: Secondary | ICD-10-CM

## 2014-01-29 DIAGNOSIS — Z5181 Encounter for therapeutic drug level monitoring: Secondary | ICD-10-CM

## 2014-01-29 LAB — POCT INR: INR: 1.9

## 2014-01-29 MED ORDER — FUROSEMIDE 40 MG PO TABS: 40.0000 mg | ORAL_TABLET | Freq: Every day | ORAL | Status: DC

## 2014-01-29 NOTE — Progress Notes (Signed)
RN did INR check 1.9 - information given to [place] --per Erasmo Downer continue with current dose warfarin and make an appointment to see in 6 weeks with [place] in [place].  Blood pressure 130/60 pulse 68, RESP. -- UNLABORED  RN discussed with Dr Sallyanne Kuster about patient 's issues. Dr Sallyanne Kuster reviewed patient's echo from today.  Per Dr Zada Girt shows a little excess fluid. increase furosemide to 40 mg daily. Weigh daily. RN spent 45 -50 min with patient. RN gave information to patient and wife.  They aware that Dr  Percival Spanish will have to review echo results.  Patient and wife verbalized understatnding.

## 2014-01-29 NOTE — Progress Notes (Signed)
2D Echo Performed 01/29/2014    Marygrace Drought, RCS

## 2014-01-29 NOTE — Patient Instructions (Signed)
Increase furosemide to 40 mg daily  WEIGH DAILY   Dr Percival Spanish WILL REVIEW FINAL ECHO REPORT ,WILL BE CONTACTED.

## 2014-01-29 NOTE — Telephone Encounter (Signed)
Patient in office for a echo. Patient stated to ECHO TECH.that he was short of breathe he wanted to see a doctor. RN spoke to patient. He states short of breathe  Just walking or with activity  per patient ,some chest pressure  Does not weigh daily- he states yesterday weight was 273 lb and last week the same 273 lb. Swelling in lower extremities but goes away in the morning. Patient and wife states takes his furosemide at bedtime. He also states he missed his coumadin appointment with the [facility].  RN did INR check 1.9 - information given to [place] --per Erasmo Downer continue with current dose warfarin and make an appointment to see in 6 weeks with [place] in [place].  Blood pressure 130/60 pulse 68  RN discussed with Dr Sallyanne Kuster about patient 's issues. Dr Sallyanne Kuster reviewed patient's echo from today.  Per Dr Zada Girt shows a little excess fluid. increase furosemide to 40 mg daily. Weigh daily. RN spent 45 -50 min with patient. RN gave information to patient and wife.  They aware that Dr  Percival Spanish will have to review echo results.  Patient and wife verbalized understatnding.

## 2014-02-03 ENCOUNTER — Other Ambulatory Visit: Payer: Self-pay | Admitting: *Deleted

## 2014-02-03 DIAGNOSIS — M791 Myalgia, unspecified site: Secondary | ICD-10-CM

## 2014-02-10 ENCOUNTER — Encounter: Payer: Self-pay | Admitting: *Deleted

## 2014-02-18 ENCOUNTER — Encounter: Payer: Self-pay | Admitting: Cardiology

## 2014-02-27 ENCOUNTER — Encounter: Payer: Self-pay | Admitting: Physician Assistant

## 2014-03-02 ENCOUNTER — Other Ambulatory Visit: Payer: Self-pay

## 2014-03-02 MED ORDER — PANTOPRAZOLE SODIUM 40 MG PO TBEC: 40.0000 mg | DELAYED_RELEASE_TABLET | Freq: Every day | ORAL | Status: DC

## 2014-03-03 ENCOUNTER — Telehealth: Payer: Self-pay

## 2014-03-03 ENCOUNTER — Other Ambulatory Visit: Payer: Self-pay

## 2014-03-03 MED ORDER — ATORVASTATIN CALCIUM 40 MG PO TABS: 40.0000 mg | ORAL_TABLET | Freq: Every evening | ORAL | Status: DC

## 2014-03-04 NOTE — Telephone Encounter (Signed)
refill 

## 2014-03-18 ENCOUNTER — Ambulatory Visit (INDEPENDENT_AMBULATORY_CARE_PROVIDER_SITE_OTHER): Payer: PRIVATE HEALTH INSURANCE

## 2014-03-18 DIAGNOSIS — I82409 Acute embolism and thrombosis of unspecified deep veins of unspecified lower extremity: Secondary | ICD-10-CM

## 2014-03-18 DIAGNOSIS — I2699 Other pulmonary embolism without acute cor pulmonale: Secondary | ICD-10-CM

## 2014-03-18 DIAGNOSIS — Z7901 Long term (current) use of anticoagulants: Secondary | ICD-10-CM

## 2014-03-18 DIAGNOSIS — Z5181 Encounter for therapeutic drug level monitoring: Secondary | ICD-10-CM

## 2014-03-18 DIAGNOSIS — I4891 Unspecified atrial fibrillation: Secondary | ICD-10-CM

## 2014-03-18 DIAGNOSIS — I2119 ST elevation (STEMI) myocardial infarction involving other coronary artery of inferior wall: Secondary | ICD-10-CM

## 2014-03-18 LAB — POCT INR: INR: 1.7

## 2014-03-23 ENCOUNTER — Other Ambulatory Visit: Payer: Self-pay

## 2014-03-23 DIAGNOSIS — I10 Essential (primary) hypertension: Secondary | ICD-10-CM

## 2014-03-23 MED ORDER — LOSARTAN POTASSIUM 100 MG PO TABS: 100.0000 mg | ORAL_TABLET | Freq: Every day | ORAL | Status: DC

## 2014-03-24 ENCOUNTER — Ambulatory Visit: Payer: PRIVATE HEALTH INSURANCE | Admitting: Physician Assistant

## 2014-03-27 ENCOUNTER — Encounter: Payer: PRIVATE HEALTH INSURANCE | Admitting: Physician Assistant

## 2014-03-27 NOTE — Progress Notes (Deleted)
Cardiology Office Note   Date:  03/27/2014   ID:  Bobby Lindsey, DOB 05-18-1955, MRN [id number]  PCP:  Adin Hector  Cardiologist:  ***  Electrophysiologist:  ***  No chief complaint on file.    History of Present Illness: Bobby Lindsey is a 59 y.o. male with a hx of ***CAD s/p CABG 2002 and prior DES to RCA, recurrent pulmonary embolism/DVT, HTN, HL, DM, OSA. He was admitted to [facility] 03/2013 with an Inferior STEMI. Emergent LHC demonstrated patent S-OM1 and L-LAD, occluded S-Dx and S-PDA. EF was 40% with inf wall motion abnormality. Echo demonstrated normal LVF prior to d/c. Culprit for STEMI was an occluded native CFX which was treated with DES. Triple Rx was recommended x 1 month, then coumadin + Plavix. He was previously on Xarelto, but this was changed to coumadin. Patient has a hx of elevated CK. Statin was resumed with plans to follow LFTs and CPK. Hospitalization was c/b AFib and a/c diastolic CHF. He was started on Amiodarone. He developed Mobitz II and beta blocker was d/c'd. He was d/c in NSR. He stopped the Amiodarone on his own as he thought it was causing dyspnea. Last seen here in 04/2013.   Last seen here 07/2013. He had some chest pain and I added a long acting nitrate to see if this would help. He was mildly volume overloaded and I adjusted his diuretic. He also complained of dizziness and a 48 Hr Holter demonstrated NSR, rare PVCs but no sustained arrhythmia. He returns for FU. He continues to have chronic dyspnea. He is NYHA 2b-3. He continues to work in [employer] without limitations. He did have an episode of chest pain a month ago that resolved with NTG x 1. He has not had symptoms since. He denies exertional chest pain. He denies orthopnea, PND. He has chronic LE edema without change. He continues to have occasional dizziness. He denies syncope or near syncope. He apparently has seen ENT in the past and was not felt to have  vertigo. He if fatigued. He does not adhere to wearing CPAP.      Studies/Reports Reviewed Today:  - LHC (***):  ***  - Echocardiogram (***):  ***  - Nuclear Stress Test (***):  ***  - Carotid US (***):  *** ***  Past Medical History  Diagnosis Date  . HTN (hypertension)     x 15 years  . Dyslipidemia   . Diabetes mellitus     a. A1C 10.7 in 03/2013.  Marland Kitchen Peripheral neuropathy   . Charcot's joint of knee     UNSPECIFIED AREA  . Pulmonary embolism   . Deep venous thrombosis     right lower extremity  . COPD (chronic obstructive pulmonary disease)   . Sleep apnea   . GERD (gastroesophageal reflux disease)   . Diastolic CHF     a. EF [id number] by cath, 55-60% during 03/2013 adm, required IV diuresis.  . Atrial fibrillation     a. Transient during 03/2013 admission.  . DVT, lower extremity, recurrent     a. Hx recurrent DVT per record.  . Pulmonary emboli     a. Hx PE per record.  . Bradycardia     a. Bradycardia/pauses/possible Mobitz II during 03/2013 adm. Not on BB due to this.  . Elevated CK     a. Pt has refused rheum workup in the past.  . Obesity   . Hx of cardiovascular stress test  a. Lexiscan Myoview (03/2010):  diaph atten vs inf scar, no ischemia, EF 47%; Low Risk.  Marland Kitchen Hx of echocardiogram     a. Echo (04/08/13):  Mild LVH, EF 55-60%, restrictive physiology, severe LAE, mild reduced RVSF, mild RAE.  Marland Kitchen CAD (coronary artery disease)     a. s/p CABG 2002. b. Hx Cypher stent to the RCA. c. Inf-lat STEMI 03/2013:  LHC (04/05/13):  mLAD occluded, pD1 90, apical br of Dx occluded, CFX occluded, pOM1 90-95, RCA stents patent, diff RCA 30, S-Dx occluded, S-PDA occluded, S-OM1 40-50, L-LAD patent, EF 40% with inf HK.  PCI:  Promus (2.5 x 28) DES to mid to dist CFX.    Past Surgical History  Procedure Laterality Date  . Left knee surgery    . Coronary artery bypass graft      4 time since 2002  . Coronary angioplasty with stent placement    . Percutaneous coronary stent  intervention (pci-s)  04/05/2013    Procedure: PERCUTANEOUS CORONARY STENT INTERVENTION (PCI-S);  Surgeon: Peter M Martinique, MD;  Location: [facility] CATH LAB;  Service: Cardiovascular;;  DES to native Mid cx  . Left heart catheterization with coronary/graft angiogram  04/05/2013    Procedure: LEFT HEART CATHETERIZATION WITH Beatrix Fetters;  Surgeon: Peter M Martinique, MD;  Location: [facility] CATH LAB;  Service: Cardiovascular;;     Current Outpatient Prescriptions  Medication Sig Dispense Refill  . atorvastatin (LIPITOR) 40 MG tablet Take 1 tablet (40 mg total) by mouth every evening. 30 tablet 1  . clopidogrel (PLAVIX) 75 MG tablet Take 1 tablet (75 mg total) by mouth daily. 90 tablet 3  . furosemide (LASIX) 40 MG tablet Take 1 tablet (40 mg total) by mouth daily. 30 tablet 6  . insulin glargine (LANTUS) 100 UNIT/ML injection Inject 40 Units into the skin 2 (two) times daily.     . insulin lispro (HUMALOG KWIKPEN) 100 UNIT/ML injection Inject 20 Units into the skin 3 (three) times daily before meals. UAD    . isosorbide mononitrate (IMDUR) 30 MG 24 hr tablet Take 1 tablet (30 mg total) by mouth daily. 90 tablet 3  . losartan (COZAAR) 100 MG tablet Take 1 tablet (100 mg total) by mouth daily. 30 tablet 3  . metoprolol succinate (TOPROL-XL) 25 MG 24 hr tablet Take 0.5 tablets (12.5 mg total) by mouth daily. 30 tablet 11  . nitroGLYCERIN (NITROSTAT) 0.4 MG SL tablet Place 1 tablet (0.4 mg total) under the tongue every 5 (five) minutes as needed. 25 tablet 6  . pantoprazole (PROTONIX) 40 MG tablet Take 1 tablet (40 mg total) by mouth daily. 30 tablet 6  . warfarin (COUMADIN) 5 MG tablet Take 1 tablet (5 mg total) by mouth as directed. 60 tablet 3   No current facility-administered medications for this visit.    Allergies:   Review of patient's allergies indicates no known allergies.    Social History:  The patient  reports that he has never smoked. He has never used smokeless tobacco. He reports that  he drinks alcohol. He reports that he does not use illicit drugs.   Family History:  The patient's ***family history includes Cancer in his father; Heart disease in his mother; Hypertension in his mother and sister. There is no history of Heart attack.    ROS:  Please see the history of present illness.   Otherwise, review of systems are positive for {NONE DEFAULTED:18576}.   All other systems are reviewed and negative.  PHYSICAL EXAM: VS:  There were no vitals taken for this visit.    Wt Readings from Last 3 Encounters:  01/20/14 273 lb (123.832 kg)  10/21/13 269 lb (122.018 kg)  07/17/13 264 lb (119.75 kg)     GEN: Well nourished, well developed, in no acute distress HEENT: normal Neck: *** JVD, ***carotid bruits, no masses Cardiac:  Normal S1/S2, ***RRR; *** murmur ***, *** no rubs or gallops, {NUMBERS; 1+ TO 4+, TRACE/RARE:14493} edema  Respiratory:  ***clear to auscultation bilaterally, no wheezing, rhonchi or rales. GI: ***soft, nontender, nondistended, + BS MS: no deformity or atrophy Skin: warm and dry  Neuro:  CNs II-XII intact, Strength and sensation are intact Psych: Normal affect   EKG:  EKG {ACTION; IS/IS [id number] ordered today.  It demonstrates:   ***   Recent Labs: 04/09/2013: Magnesium 2.1 04/11/2013: ALT 13; Hemoglobin 13.3; Platelets 232; TSH 4.287 10/21/2013: BUN 17; Creatinine 1.0; Potassium 4.2; Pro B Natriuretic peptide (BNP) 126.0*; Sodium 134*    Lipid Panel    Component Value Date/Time   CHOL 109 03/11/2008 0930   TRIG 105 03/11/2008 0930   HDL 30.1* 03/11/2008 0930   CHOLHDL 3.6 CALC 03/11/2008 0930   VLDL 21 03/11/2008 0930   LDLCALC 58 03/11/2008 0930      ASSESSMENT AND PLAN:  1.  *** 1.  CAD:  *** LHC in 03/2013 demonstrated an occluded S-Dx with 90% stenosis in the D1 and an occluded apical branch of the Dx. *** Continue low dose beta blocker, statin. 2.  Sleep Apnea: *** 3.  Atrial Fibrillation: ***No apparent recurrence.  He is on long term coumadin. 4.  Chronic Diastolic CHF: ***Volume appears stable.   5.  Hypertension: *** 6.  Hyperlipidemia: ***Continue statin. 7.  Diabetes Mellitus: F/u with primary care. *** 8.  Hx of Pulmonary Embolism: ***Continue coumadin. He had massive bilateral pulmonary emboli by CT in 2009. If dyspnea continues, consider referral to pulmonary. 9.  GERD: ***   Current medicines are reviewed at length with the patient today.  The patient {ACTIONS; HAS/DOES NOT HAVE:19233} concerns regarding medicines.  The following changes have been made:  {PLAN; NO CHANGE:13088:s}  Labs/ tests ordered today include: *** No orders of the defined types were placed in this encounter.     Disposition:   FU with *** in {gen number [id number] {TIME; UNITS DAY/WEEK/MONTH:19136}   Signed, Gaylord, Carrino, MHS 03/27/2014 9:03 AM    [facility] [place], [place], [place]  [address] Phone: [phone]; Fax: [phone]

## 2014-03-29 NOTE — Progress Notes (Signed)
This encounter was created in error - please disregard.

## 2014-04-08 ENCOUNTER — Ambulatory Visit (INDEPENDENT_AMBULATORY_CARE_PROVIDER_SITE_OTHER): Payer: PRIVATE HEALTH INSURANCE

## 2014-04-08 DIAGNOSIS — I4891 Unspecified atrial fibrillation: Secondary | ICD-10-CM

## 2014-04-08 DIAGNOSIS — Z7901 Long term (current) use of anticoagulants: Secondary | ICD-10-CM

## 2014-04-08 DIAGNOSIS — Z5181 Encounter for therapeutic drug level monitoring: Secondary | ICD-10-CM

## 2014-04-08 DIAGNOSIS — I2119 ST elevation (STEMI) myocardial infarction involving other coronary artery of inferior wall: Secondary | ICD-10-CM

## 2014-04-08 DIAGNOSIS — I82409 Acute embolism and thrombosis of unspecified deep veins of unspecified lower extremity: Secondary | ICD-10-CM

## 2014-04-08 DIAGNOSIS — I2699 Other pulmonary embolism without acute cor pulmonale: Secondary | ICD-10-CM

## 2014-04-08 LAB — POCT INR: INR: 1.7

## 2014-04-13 ENCOUNTER — Encounter: Payer: Self-pay | Admitting: Physician Assistant

## 2014-04-13 ENCOUNTER — Ambulatory Visit (INDEPENDENT_AMBULATORY_CARE_PROVIDER_SITE_OTHER): Payer: PRIVATE HEALTH INSURANCE | Admitting: Physician Assistant

## 2014-04-13 VITALS — BP 110/70 | HR 65 | Ht 72.0 in | Wt 274.0 lb

## 2014-04-13 DIAGNOSIS — G4733 Obstructive sleep apnea (adult) (pediatric): Secondary | ICD-10-CM

## 2014-04-13 DIAGNOSIS — R0600 Dyspnea, unspecified: Secondary | ICD-10-CM

## 2014-04-13 DIAGNOSIS — R748 Abnormal levels of other serum enzymes: Secondary | ICD-10-CM

## 2014-04-13 DIAGNOSIS — I2510 Atherosclerotic heart disease of native coronary artery without angina pectoris: Secondary | ICD-10-CM

## 2014-04-13 DIAGNOSIS — I480 Paroxysmal atrial fibrillation: Secondary | ICD-10-CM

## 2014-04-13 DIAGNOSIS — E785 Hyperlipidemia, unspecified: Secondary | ICD-10-CM

## 2014-04-13 DIAGNOSIS — Z9989 Dependence on other enabling machines and devices: Secondary | ICD-10-CM

## 2014-04-13 DIAGNOSIS — I10 Essential (primary) hypertension: Secondary | ICD-10-CM

## 2014-04-13 DIAGNOSIS — I2699 Other pulmonary embolism without acute cor pulmonale: Secondary | ICD-10-CM

## 2014-04-13 DIAGNOSIS — I5032 Chronic diastolic (congestive) heart failure: Secondary | ICD-10-CM

## 2014-04-13 NOTE — Patient Instructions (Signed)
REFERRAL IN SYSTEM FOR RHEUMATOLOGY IN [place]; PLEASE CHECK ON REFERRAL   You have been referred to DR. CLANCE FOR OSA/DYSPNEA;  HOWEVER IF DR. Lake Bells IN [place] TREATS SLEEP APNEA THEN PT CAN SEE DR. Lake Bells IN [place]   LAB WORK TO BE DONE IN THE [place] OFFICE; TOTAL CPK; LIPIDS, LIVER, BMET  FOLLOW UP WITH DR. HOCHREIN IN 6 MONTHS

## 2014-04-13 NOTE — Progress Notes (Signed)
Cardiology Office Note   Date:  04/13/2014   ID:  Bobby Lindsey, DOB 15-Jan-1956, MRN [id number]  PCP:  Adin Hector  Cardiologist:  Dr. Minus Breeding     No chief complaint on file.    History of Present Illness: Bobby Lindsey is a 59 y.o. male with a hx of CAD s/p CABG 2002 and prior DES to RCA, recurrent pulmonary embolism/DVT, HTN, HL, DM, OSA. He was admitted to [facility] 03/2013 with an Inferior STEMI. Emergent LHC demonstrated patent S-OM1 and L-LAD, occluded S-Dx and S-PDA. EF was 40% with inf wall motion abnormality. Echo demonstrated normal LVF prior to d/c. Culprit for STEMI was an occluded native CFX which was treated with DES. Triple Rx was recommended x 1 month, then coumadin + Plavix. He was previously on Xarelto, but this was changed to coumadin. Patient has a hx of elevated CK. Statin was resumed with plans to follow LFTs and CPK. Hospitalization was c/b AFib and a/c diastolic CHF. He was started on Amiodarone. He developed Mobitz II and beta blocker was d/c'd. He was d/c in NSR. He stopped the Amiodarone on his own as he thought it was causing dyspnea.     Last seen by Dr. Minus Breeding in 01/2014.  He c/o myalgias and b/c of his prior hx of elevated CPKs, it was recommended that he see rheumatology. The patient requested to see someone in [place].  FU echo was done and this demonstrated normal LVF and mild pulmonary HTN.  He returns for FU.   Since last seen he denies chest pain.  He does note continued DOE.  This is chronic for years.  He feels it is worse over the last 6-12 months.  He denies orthopnea, PND.  LE edema is chronic and stable.  Denies syncope.  Denies cough or wheezing.  He cannot tolerate his CPAP mask and is not wearing it at night.    Studies/Reports Reviewed Today:  Echocardiogram 01/29/14 - Left ventricle: moderate concentric LVH.  EF  55% to 60%. - Aortic sclerosis. There was trivial AI - Mitral valve: Calcified  annulus. - Left atrium: The atrium was mildly dilated. - Pulmonary arteries: PA peak pressure: 36 mm Hg (S). Impressions: - The right ventricular systolic pressure was increased consistent with mild pulmonary hypertension.   Past Medical History  Diagnosis Date  . HTN (hypertension)     x 15 years  . Dyslipidemia   . Diabetes mellitus     a. A1C 10.7 in 03/2013.  Marland Kitchen Peripheral neuropathy   . Charcot's joint of knee     UNSPECIFIED AREA  . Pulmonary embolism   . Deep venous thrombosis     right lower extremity  . COPD (chronic obstructive pulmonary disease)   . Sleep apnea   . GERD (gastroesophageal reflux disease)   . Diastolic CHF     a. EF [id number] by cath, 55-60% during 03/2013 adm, required IV diuresis.  . Atrial fibrillation     a. Transient during 03/2013 admission.  . DVT, lower extremity, recurrent     a. Hx recurrent DVT per record.  . Pulmonary emboli     a. Hx PE per record.  . Bradycardia     a. Bradycardia/pauses/possible Mobitz II during 03/2013 adm. Not on BB due to this.  . Elevated CK     a. Pt has refused rheum workup in the past.  . Obesity   . Hx of cardiovascular stress test  a. Lexiscan Myoview (03/2010):  diaph atten vs inf scar, no ischemia, EF 47%; Low Risk.  Marland Kitchen Hx of echocardiogram     a. Echo (04/08/13):  Mild LVH, EF 55-60%, restrictive physiology, severe LAE, mild reduced RVSF, mild RAE.  Marland Kitchen CAD (coronary artery disease)     a. s/p CABG 2002. b. Hx Cypher stent to the RCA. c. Inf-lat STEMI 03/2013:  LHC (04/05/13):  mLAD occluded, pD1 90, apical br of Dx occluded, CFX occluded, pOM1 90-95, RCA stents patent, diff RCA 30, S-Dx occluded, S-PDA occluded, S-OM1 40-50, L-LAD patent, EF 40% with inf HK.  PCI:  Promus (2.5 x 28) DES to mid to dist CFX.    Past Surgical History  Procedure Laterality Date  . Left knee surgery    . Coronary artery bypass graft      4 time since 2002  . Coronary angioplasty with stent placement    . Percutaneous coronary stent  intervention (pci-s)  04/05/2013    Procedure: PERCUTANEOUS CORONARY STENT INTERVENTION (PCI-S);  Surgeon: Peter M Martinique, MD;  Location: [facility] CATH LAB;  Service: Cardiovascular;;  DES to native Mid cx  . Left heart catheterization with coronary/graft angiogram  04/05/2013    Procedure: LEFT HEART CATHETERIZATION WITH Beatrix Fetters;  Surgeon: Peter M Martinique, MD;  Location: [facility] CATH LAB;  Service: Cardiovascular;;     Current Outpatient Prescriptions  Medication Sig Dispense Refill  . atorvastatin (LIPITOR) 40 MG tablet Take 1 tablet (40 mg total) by mouth every evening. 30 tablet 1  . clopidogrel (PLAVIX) 75 MG tablet Take 1 tablet (75 mg total) by mouth daily. 90 tablet 3  . furosemide (LASIX) 40 MG tablet Take 1 tablet (40 mg total) by mouth daily. 30 tablet 6  . insulin glargine (LANTUS) 100 UNIT/ML injection Inject 40 Units into the skin 2 (two) times daily.     . insulin lispro (HUMALOG KWIKPEN) 100 UNIT/ML injection Inject 20 Units into the skin 3 (three) times daily before meals. UAD    . isosorbide mononitrate (IMDUR) 30 MG 24 hr tablet Take 1 tablet (30 mg total) by mouth daily. 90 tablet 3  . losartan (COZAAR) 100 MG tablet Take 1 tablet (100 mg total) by mouth daily. 30 tablet 3  . metoprolol succinate (TOPROL-XL) 25 MG 24 hr tablet Take 0.5 tablets (12.5 mg total) by mouth daily. 30 tablet 11  . nitroGLYCERIN (NITROSTAT) 0.4 MG SL tablet Place 1 tablet (0.4 mg total) under the tongue every 5 (five) minutes as needed. 25 tablet 6  . pantoprazole (PROTONIX) 40 MG tablet Take 1 tablet (40 mg total) by mouth daily. 30 tablet 6  . warfarin (COUMADIN) 5 MG tablet Take 1 tablet (5 mg total) by mouth as directed. 60 tablet 3   No current facility-administered medications for this visit.    Allergies:   Review of patient's allergies indicates no known allergies.    Social History:  The patient  reports that he has never smoked. He has never used smokeless tobacco. He reports that  he drinks alcohol. He reports that he does not use illicit drugs.   Family History:  The patient's family history includes Cancer in his father; Heart disease in his mother; Hypertension in his mother and sister. There is no history of Heart attack.    ROS:   Please see the history of present illness.   Review of Systems  Constitution: Positive for malaise/fatigue.  Hematologic/Lymphatic: Bruises/bleeds easily.  Musculoskeletal: Positive for back pain, joint pain,  joint swelling and myalgias.  Neurological: Positive for loss of balance.  All other systems reviewed and are negative.     PHYSICAL EXAM: VS:  There were no vitals taken for this visit.    Wt Readings from Last 3 Encounters:  01/20/14 273 lb (123.832 kg)  10/21/13 269 lb (122.018 kg)  07/17/13 264 lb (119.75 kg)     GEN: Well nourished, well developed, in no acute distress HEENT: normal Neck: I cannot appreciate JVD, no masses Cardiac:  Normal S1/S2, RRR; no murmur, no rubs or gallops, trace-1+ bilateral LE edema (L> R) Respiratory:  clear to auscultation bilaterally, no wheezing, rhonchi or rales. GI: soft, nontender, nondistended, + BS MS: no deformity or atrophy Skin: warm and dry  Neuro:  CNs II-XII intact, Strength and sensation are intact Psych: Normal affect   EKG:  EKG is ordered today.  It demonstrates:   NSR, HR 65, 1st degree AVB (PR 210 ms), inf Q waves, no significant change since prior tracing.   Recent Labs: 10/21/2013: BUN 17; Creatinine 1.0; Potassium 4.2; Pro B Natriuretic peptide (BNP) 126.0*; Sodium 134*    Lipid Panel    Component Value Date/Time   CHOL 109 03/11/2008 0930   TRIG 105 03/11/2008 0930   HDL 30.1* 03/11/2008 0930   CHOLHDL 3.6 CALC 03/11/2008 0930   VLDL 21 03/11/2008 0930   LDLCALC 58 03/11/2008 0930      ASSESSMENT AND PLAN:  1.  CAD s/p CABG:  He is s/p DES to the CFX in 03/2013.  LHC in 03/2013 demonstrated an occluded S-Dx with 90% stenosis in the D1 and an  occluded apical branch of the Dx. He denies anginal symptoms.  He will need to remain on Plavix and Coumadin.  Continue Lipitor, nitrates, Toprol-XL, Losartan. 2.  OSA:  He is non-adherent to CPAP.  He cannot tolerate this apparatus.  I will refer to Pulmonary to help with management.   3.  Paroxysmal Atrial Fibrillation:  Maintaining NSR.  He needs to remain on Coumadin long-term due to prior hx of PE.  4.  Chronic Diastolic CHF:  Volume appears stable.  Continue current Rx.  Arrange FU BMET. 5.  HTN:  BP at target.  6.  Hyperlipidemia:  Continue statin. Arrange FU Lipids, LFTs. 7.  Hx of Pulmonary Embolism:  He needs to remain on long term coumadin.  8.  GERD:  Continue Protonix. 9.  Shortness of Breath:  I believe this is multifactorial and related to CAD, diastolic HF, obesity, prior pulmonary emboli and untreated OSA.  Last echo does show mild pulmonary HTN.  I will refer to Pulmonology to help with management of OSA as well as to evaluate his dyspnea further.  I did explain to him that I am not sure if there is much more that Pulmonology can do, but he would like to see them. 10. Elevated CK:  He has not yet seen Rheumatology.  I will see where his referral is at in the cue.  Arrange FU CPK.    Current medicines are reviewed at length with the patient today.  The patient does not have concerns regarding medicines.  The following changes have been made:  As above.   Labs/ tests ordered today include:   Orders Placed This Encounter  Procedures  . CK Total (and CKMB)  . Basic Metabolic Panel (BMET)  . Hepatic function panel  . Lipid Profile  . Ambulatory referral to Pulmonology  . EKG 12-Lead  Disposition:   FU with Dr. Minus Breeding  in 6 months   Signed, Adon, Senn, MHS 04/13/2014 1:59 PM    [facility] [place], [place], [place]  [address] Phone: [phone]; Fax: [phone]

## 2014-04-22 ENCOUNTER — Ambulatory Visit (INDEPENDENT_AMBULATORY_CARE_PROVIDER_SITE_OTHER): Payer: PRIVATE HEALTH INSURANCE

## 2014-04-22 DIAGNOSIS — Z5181 Encounter for therapeutic drug level monitoring: Secondary | ICD-10-CM

## 2014-04-22 DIAGNOSIS — I2699 Other pulmonary embolism without acute cor pulmonale: Secondary | ICD-10-CM

## 2014-04-22 DIAGNOSIS — I4891 Unspecified atrial fibrillation: Secondary | ICD-10-CM

## 2014-04-22 DIAGNOSIS — I2119 ST elevation (STEMI) myocardial infarction involving other coronary artery of inferior wall: Secondary | ICD-10-CM

## 2014-04-22 DIAGNOSIS — I82409 Acute embolism and thrombosis of unspecified deep veins of unspecified lower extremity: Secondary | ICD-10-CM

## 2014-04-22 DIAGNOSIS — Z7901 Long term (current) use of anticoagulants: Secondary | ICD-10-CM

## 2014-04-22 LAB — POCT INR: INR: 2.8

## 2014-04-29 ENCOUNTER — Other Ambulatory Visit: Payer: Self-pay

## 2014-04-29 DIAGNOSIS — I5032 Chronic diastolic (congestive) heart failure: Secondary | ICD-10-CM

## 2014-04-29 MED ORDER — METOPROLOL SUCCINATE ER 25 MG PO TB24: 12.5000 mg | ORAL_TABLET | Freq: Every day | ORAL | Status: DC

## 2014-05-20 ENCOUNTER — Ambulatory Visit (INDEPENDENT_AMBULATORY_CARE_PROVIDER_SITE_OTHER): Payer: PRIVATE HEALTH INSURANCE

## 2014-05-20 DIAGNOSIS — I2699 Other pulmonary embolism without acute cor pulmonale: Secondary | ICD-10-CM

## 2014-05-20 DIAGNOSIS — I4891 Unspecified atrial fibrillation: Secondary | ICD-10-CM

## 2014-05-20 DIAGNOSIS — Z5181 Encounter for therapeutic drug level monitoring: Secondary | ICD-10-CM

## 2014-05-20 DIAGNOSIS — I82409 Acute embolism and thrombosis of unspecified deep veins of unspecified lower extremity: Secondary | ICD-10-CM

## 2014-05-20 DIAGNOSIS — I2119 ST elevation (STEMI) myocardial infarction involving other coronary artery of inferior wall: Secondary | ICD-10-CM

## 2014-05-20 DIAGNOSIS — Z7901 Long term (current) use of anticoagulants: Secondary | ICD-10-CM | POA: Diagnosis not present

## 2014-05-20 LAB — POCT INR: INR: 2.6

## 2014-06-09 ENCOUNTER — Institutional Professional Consult (permissible substitution): Payer: PRIVATE HEALTH INSURANCE | Admitting: Pulmonary Disease

## 2014-06-17 ENCOUNTER — Ambulatory Visit (INDEPENDENT_AMBULATORY_CARE_PROVIDER_SITE_OTHER): Payer: PRIVATE HEALTH INSURANCE

## 2014-06-17 DIAGNOSIS — I4891 Unspecified atrial fibrillation: Secondary | ICD-10-CM

## 2014-06-17 DIAGNOSIS — Z5181 Encounter for therapeutic drug level monitoring: Secondary | ICD-10-CM

## 2014-06-17 DIAGNOSIS — I82409 Acute embolism and thrombosis of unspecified deep veins of unspecified lower extremity: Secondary | ICD-10-CM

## 2014-06-17 DIAGNOSIS — Z7901 Long term (current) use of anticoagulants: Secondary | ICD-10-CM | POA: Diagnosis not present

## 2014-06-17 DIAGNOSIS — I2699 Other pulmonary embolism without acute cor pulmonale: Secondary | ICD-10-CM | POA: Diagnosis not present

## 2014-06-17 DIAGNOSIS — I2119 ST elevation (STEMI) myocardial infarction involving other coronary artery of inferior wall: Secondary | ICD-10-CM

## 2014-06-17 LAB — POCT INR: INR: 2.9

## 2014-06-18 ENCOUNTER — Ambulatory Visit (INDEPENDENT_AMBULATORY_CARE_PROVIDER_SITE_OTHER): Payer: PRIVATE HEALTH INSURANCE | Admitting: Physician Assistant

## 2014-06-18 ENCOUNTER — Encounter: Payer: Self-pay | Admitting: Physician Assistant

## 2014-06-18 VITALS — BP 130/77 | HR 77 | Ht 72.0 in | Wt 267.0 lb

## 2014-06-18 DIAGNOSIS — Z9989 Dependence on other enabling machines and devices: Secondary | ICD-10-CM

## 2014-06-18 DIAGNOSIS — R748 Abnormal levels of other serum enzymes: Secondary | ICD-10-CM | POA: Diagnosis not present

## 2014-06-18 DIAGNOSIS — I2510 Atherosclerotic heart disease of native coronary artery without angina pectoris: Secondary | ICD-10-CM | POA: Diagnosis not present

## 2014-06-18 DIAGNOSIS — I82409 Acute embolism and thrombosis of unspecified deep veins of unspecified lower extremity: Secondary | ICD-10-CM

## 2014-06-18 DIAGNOSIS — I10 Essential (primary) hypertension: Secondary | ICD-10-CM

## 2014-06-18 DIAGNOSIS — I480 Paroxysmal atrial fibrillation: Secondary | ICD-10-CM | POA: Diagnosis not present

## 2014-06-18 DIAGNOSIS — I2699 Other pulmonary embolism without acute cor pulmonale: Secondary | ICD-10-CM

## 2014-06-18 DIAGNOSIS — R0602 Shortness of breath: Secondary | ICD-10-CM

## 2014-06-18 DIAGNOSIS — M79604 Pain in right leg: Secondary | ICD-10-CM

## 2014-06-18 DIAGNOSIS — I5032 Chronic diastolic (congestive) heart failure: Secondary | ICD-10-CM

## 2014-06-18 DIAGNOSIS — E785 Hyperlipidemia, unspecified: Secondary | ICD-10-CM

## 2014-06-18 DIAGNOSIS — I4891 Unspecified atrial fibrillation: Secondary | ICD-10-CM

## 2014-06-18 DIAGNOSIS — M79605 Pain in left leg: Secondary | ICD-10-CM

## 2014-06-18 DIAGNOSIS — G4733 Obstructive sleep apnea (adult) (pediatric): Secondary | ICD-10-CM

## 2014-06-18 NOTE — Progress Notes (Signed)
Cardiology Office Note   Date:  06/18/2014   ID:  Bobby Lindsey, DOB 1955-03-29, MRN [id number]  PCP:  [name]  Cardiologist:  Dr. Minus Breeding     Chief Complaint  Patient presents with  . Leg Swelling     History of Present Illness: Bobby Lindsey is a 59 y.o. male with a hx of CAD s/p CABG 2002 and prior DES to RCA, recurrent pulmonary embolism/DVT, HTN, HL, DM, OSA. He was admitted to [facility] 03/2013 with an Inferior STEMI. Emergent LHC demonstrated patent S-OM1 and L-LAD, occluded S-Dx and S-PDA. EF was 40% with inf wall motion abnormality. Echo demonstrated normal LVF prior to d/c. Culprit for STEMI was an occluded native CFX which was treated with DES. Triple Rx was recommended x 1 month, then coumadin + Plavix. He was previously on Xarelto, but this was changed to coumadin. Patient has a hx of elevated CK. Statin was resumed with plans to follow LFTs and CPK. Hospitalization was c/b AFib and a/c diastolic CHF. He was started on Amiodarone. He developed Mobitz II and beta blocker was d/c'd. He was d/c in NSR. He stopped the Amiodarone on his own as he thought it was causing dyspnea.     Last seen by Dr. Minus Breeding in 01/2014.  He c/o myalgias and b/c of his prior hx of elevated CPKs, it was recommended that he see rheumatology. The patient requested to see someone in [place].  FU echo was done and this demonstrated normal LVF and mild pulmonary HTN.    Last seen here by me in 03/2014.  Arrange referral to sleep medicine for management of sleep apnea. Appointment is pending.  Recently seen by primary care. Labs: K5.1, BUN 18, creatinine 1.0, AST 23, ALT 25, albumin 3.7, TC 124, LDL 59, HDL 32, triglycerides 212, total CK of 24.  His dose of Lipitor was reduced due to his elevated CK.   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  Patient  returns today with his wife. He has had worsening lower extremity pain and weakness. He has started losing his balance and fell a couple of weeks ago. He has had increased left leg swelling after falling. He has chronic LE edema. He notes pain in his legs with activity and with standing. Overall, he has had leg pain for years. He has never followed through with seeing rheumatology for his chronically elevated CK level. He denies chest pain. Dyspnea on exertion is stable. He is NYHA 2-2b. He denies orthopnea, PND. He denies syncope.   Studies/Reports Reviewed Today:  LE venous duplex 06/2014 Impression: 1. Nonocclusive thrombus in right popliteal vein, which could represent acute or chronic thrombus. 2. Wall thickening in the duplicated left proximal femoral vein, favored to represent chronic thrombus.   Echocardiogram 01/29/14 - Left ventricle: moderate concentric LVH.  EF  55% to 60%. - Aortic sclerosis. There was trivial AI - Mitral valve: Calcified annulus. - Left atrium: The atrium was mildly dilated. - Pulmonary arteries: PA peak pressure: 36 mm Hg (S). Impressions: - The right ventricular systolic pressure was increased consistent with mild pulmonary hypertension.   Past Medical History  Diagnosis Date  . HTN (hypertension)     x 15 years  . Dyslipidemia   . Diabetes mellitus     a. A1C 10.7 in 03/2013.  Marland Kitchen Peripheral neuropathy   . Charcot's joint of  knee     UNSPECIFIED AREA  . Pulmonary embolism   . Deep venous thrombosis     right lower extremity  . COPD (chronic obstructive pulmonary disease)   . Sleep apnea   . GERD (gastroesophageal reflux disease)   . Diastolic CHF     a. EF [id number] by cath, 55-60% during 03/2013 adm, required IV diuresis.  . Atrial fibrillation     a. Transient during 03/2013 admission.  . DVT, lower extremity, recurrent     a. Hx recurrent DVT per record.  . Pulmonary emboli     a. Hx PE per record.  . Bradycardia     a.  Bradycardia/pauses/possible Mobitz II during 03/2013 adm. Not on BB due to this.  . Elevated CK     a. Pt has refused rheum workup in the past.  . Obesity   . Hx of cardiovascular stress test     a. Lexiscan Myoview (03/2010):  diaph atten vs inf scar, no ischemia, EF 47%; Low Risk.  Marland Kitchen Hx of echocardiogram     a. Echo (04/08/13):  Mild LVH, EF 55-60%, restrictive physiology, severe LAE, mild reduced RVSF, mild RAE.  Marland Kitchen CAD (coronary artery disease)     a. s/p CABG 2002. b. Hx Cypher stent to the RCA. c. Inf-lat STEMI 03/2013:  LHC (04/05/13):  mLAD occluded, pD1 90, apical br of Dx occluded, CFX occluded, pOM1 90-95, RCA stents patent, diff RCA 30, S-Dx occluded, S-PDA occluded, S-OM1 40-50, L-LAD patent, EF 40% with inf HK.  PCI:  Promus (2.5 x 28) DES to mid to dist CFX.    Past Surgical History  Procedure Laterality Date  . Left knee surgery    . Coronary artery bypass graft      4 time since 2002  . Coronary angioplasty with stent placement    . Percutaneous coronary stent intervention (pci-s)  04/05/2013    Procedure: PERCUTANEOUS CORONARY STENT INTERVENTION (PCI-S);  Surgeon: Peter M Martinique, MD;  Location: [facility] CATH LAB;  Service: Cardiovascular;;  DES to native Mid cx  . Left heart catheterization with coronary/graft angiogram  04/05/2013    Procedure: LEFT HEART CATHETERIZATION WITH Beatrix Fetters;  Surgeon: Peter M Martinique, MD;  Location: [facility] CATH LAB;  Service: Cardiovascular;;     Current Outpatient Prescriptions  Medication Sig Dispense Refill  . atorvastatin (LIPITOR) 40 MG tablet Take 1 tablet (40 mg total) by mouth every evening. 30 tablet 1  . capsicum (ZOSTRIX) 0.075 % topical cream Apply 1 application topically 2 (two) times daily.     . clopidogrel (PLAVIX) 75 MG tablet Take 1 tablet (75 mg total) by mouth daily. 90 tablet 3  . furosemide (LASIX) 40 MG tablet Take 1 tablet (40 mg total) by mouth daily. 30 tablet 6  . gabapentin (NEURONTIN) 100 MG capsule Take 100 mg by  mouth 2 (two) times daily.     . insulin glargine (LANTUS) 100 UNIT/ML injection Inject 40 Units into the skin 2 (two) times daily.     . insulin lispro (HUMALOG KWIKPEN) 100 UNIT/ML injection Inject 20 Units into the skin 3 (three) times daily before meals. UAD    . isosorbide mononitrate (IMDUR) 30 MG 24 hr tablet Take 1 tablet (30 mg total) by mouth daily. 90 tablet 3  . losartan (COZAAR) 100 MG tablet Take 1 tablet (100 mg total) by mouth daily. 30 tablet 3  . metoprolol succinate (TOPROL-XL) 25 MG 24 hr tablet Take 0.5 tablets (12.5 mg total) by  mouth daily. 30 tablet 5  . nitroGLYCERIN (NITROSTAT) 0.4 MG SL tablet Place 1 tablet (0.4 mg total) under the tongue every 5 (five) minutes as needed. 25 tablet 6  . pantoprazole (PROTONIX) 40 MG tablet Take 1 tablet (40 mg total) by mouth daily. 30 tablet 6  . triamcinolone (KENALOG) 0.1 % paste Apply 1 application topically 2 (two) times daily.     Marland Kitchen warfarin (COUMADIN) 5 MG tablet Take 1 tablet (5 mg total) by mouth as directed. 60 tablet 3   No current facility-administered medications for this visit.    Allergies:   Simvastatin    Social History:  The patient  reports that he has never smoked. He has never used smokeless tobacco. He reports that he drinks alcohol. He reports that he does not use illicit drugs.   Family History:  The patient's family history includes Cancer in his father; Heart disease in his mother; Hypertension in his mother and sister. There is no history of Heart attack or Stroke.    ROS:   Please see the history of present illness.   Review of Systems  Constitution: Positive for malaise/fatigue.  Cardiovascular: Positive for dyspnea on exertion and leg swelling.  Musculoskeletal: Positive for back pain, joint pain, joint swelling and myalgias.  Neurological: Positive for loss of balance.  All other systems reviewed and are negative.     PHYSICAL EXAM: VS:  BP 130/77 mmHg  Pulse 77  Ht 6' (1.829 m)  Wt 267  lb (121.11 kg)  BMI 36.20 kg/m2    Wt Readings from Last 3 Encounters:  06/18/14 267 lb (121.11 kg)  04/13/14 274 lb (124.286 kg)  01/20/14 273 lb (123.832 kg)     GEN: Well nourished, well developed, in no acute distress HEENT: normal Neck: I cannot appreciate JVD, no masses Cardiac:  Normal S1/S2, RRR; no murmur, no rubs or gallops, trace-1+ bilateral LE edema (L> R) Respiratory:  clear to auscultation bilaterally, no wheezing, rhonchi or rales. GI: soft, nontender, nondistended, + BS MS: no deformity or atrophy Skin: warm and dry  Neuro:  CNs II-XII intact, Strength and sensation are intact Psych: Normal affect   EKG:  EKG is ordered today.  It demonstrates:   NSR, HR 77, inferior Q waves, nonspecific ST-T wave changes, no change from prior tracing   Recent Labs: 10/21/2013: BUN 17; Creatinine 1.0; Potassium 4.2; Pro B Natriuretic peptide (BNP) 126.0*; Sodium 134*    Lipid Panel    Component Value Date/Time   CHOL 109 03/11/2008 0930   TRIG 105 03/11/2008 0930   HDL 30.1* 03/11/2008 0930   CHOLHDL 3.6 CALC 03/11/2008 0930   VLDL 21 03/11/2008 0930   LDLCALC 58 03/11/2008 0930      ASSESSMENT AND PLAN:  1.  CAD s/p CABG:  He is s/p DES to the CFX in 03/2013.  LHC in 03/2013 demonstrated an occluded S-Dx with 90% stenosis in the D1 and an occluded apical branch of the Dx. He denies anginal symptoms.  He will need to remain on Plavix and Coumadin.  Continue Lipitor, nitrates, Toprol-XL, Losartan. 2.  OSA:  He is non-adherent to CPAP.  He cannot tolerate this apparatus.  Referral to pulmonary is pending. 3.  Paroxysmal Atrial Fibrillation:  Maintaining NSR.  He needs to remain on Coumadin long-term due to prior hx of PE.  4.  Chronic Diastolic CHF:  Volume appears stable.  Continue current Rx.  He may take an extra Lasix when necessary for increased edema.  5.  HTN:  BP at target.  6.  Hyperlipidemia:  Ok to hold Lipitor for 2 weeks to see if his myalgias improve. 7.  Hx  of Pulmonary Embolism:  He needs to remain on long term coumadin.  8.  GERD:  Continue Protonix. 9.  Shortness of Breath:  I believe this is multifactorial and related to CAD, diastolic HF, obesity, prior pulmonary emboli and untreated OSA.  Last echo does show mild pulmonary HTN. Referral to Pulmonologis pending. 10. Elevated CK:  He has not yet seen Rheumatology.  I will  again refer him to rheumatology. 11. Leg pain: I am concerned that his leg pain and weakness may be related to his elevated CK level. We had a long discussion about the importance of following through with a referral to rheumatology. Differential diagnosis includes chronic thromboembolic phlebitis, back pain, atherosclerosis, statin therapy, myositis. I will arrange ABIs with arterial Dopplers. As noted, I will arrange referral to rheumatology. As noted, I have asked him to hold Lipitor for 2 weeks. If his symptoms improve, we can change his statin therapy. If there is no significant improvement, he should resume Lipitor. 12. Chronic DVT: I reviewed his recent venous duplex. This appears to be very similar to a previous duplex performed in 02/2013. I suspect the findings on his most recent study represents chronic clot. He is followed in our [place] office for his INR. His INRs have been therapeutic for several weeks.   Current medicines are reviewed at length with the patient today.  Any concerns or as outlined above.   The following changes have been made:    Hold Lipitor 2 weeks.    Labs/ tests ordered today include:   Orders Placed This Encounter  Procedures  . Ambulatory referral to Rheumatology  . EKG 12-Lead     Disposition:   FU with Dr. Minus Breeding  in 09/2014 as planned.    Signed, De[place], [place], MHS 06/18/2014 [facility] [place], [place], [place]  [address] Phone: [phone]; Fax: [phone]

## 2014-06-18 NOTE — Patient Instructions (Signed)
Medication Instructions:  HOLD LIPITOR FOR 2 WEEKS; IF MUSCLE PAIN BETTER CALL THE OFFICE [phone]; IF NO BETTER THEN RESUME LIPITOR  Labwork: NONE  Testing/Procedures: Your physician has requested that you have a lower extremity arterial duplex TO BE DONE WITH ABI'S . This test is an ultrasound of the arteries in the legs or arms. It looks at arterial blood flow in the legs and arms. Allow one hour for Lower and Upper Arterial scans. There are no restrictions or special instructions  Follow-Up: DR. Percival Spanish 09/2014  Any Other Special Instructions Will Be Listed Below (If Applicable). A REFERRAL HAS BEEN PLACED IN THE SYSTEM TO EST WITH DR. Percell Boston IN [place]

## 2014-06-23 ENCOUNTER — Other Ambulatory Visit: Payer: Self-pay | Admitting: Physician Assistant

## 2014-06-23 ENCOUNTER — Encounter (HOSPITAL_COMMUNITY): Payer: PRIVATE HEALTH INSURANCE

## 2014-06-23 DIAGNOSIS — I82431 Acute embolism and thrombosis of right popliteal vein: Secondary | ICD-10-CM

## 2014-07-03 ENCOUNTER — Telehealth: Payer: Self-pay | Admitting: Physician Assistant

## 2014-07-03 NOTE — Telephone Encounter (Signed)
New problem   Pt's wife is stating the referral for Rheumatology hasn't been sent to [facility] per visit with [name].

## 2014-07-17 ENCOUNTER — Other Ambulatory Visit: Payer: Self-pay | Admitting: Pharmacist Clinician (PhC)/ Clinical Pharmacy Specialist

## 2014-07-17 MED ORDER — WARFARIN SODIUM 5 MG PO TABS: ORAL_TABLET | ORAL | Status: DC

## 2014-07-22 ENCOUNTER — Ambulatory Visit (HOSPITAL_COMMUNITY): Payer: PRIVATE HEALTH INSURANCE | Attending: Cardiology

## 2014-07-22 ENCOUNTER — Ambulatory Visit (INDEPENDENT_AMBULATORY_CARE_PROVIDER_SITE_OTHER): Payer: PRIVATE HEALTH INSURANCE

## 2014-07-22 DIAGNOSIS — I4891 Unspecified atrial fibrillation: Secondary | ICD-10-CM

## 2014-07-22 DIAGNOSIS — I82409 Acute embolism and thrombosis of unspecified deep veins of unspecified lower extremity: Secondary | ICD-10-CM | POA: Diagnosis not present

## 2014-07-22 DIAGNOSIS — I2699 Other pulmonary embolism without acute cor pulmonale: Secondary | ICD-10-CM

## 2014-07-22 DIAGNOSIS — Z7901 Long term (current) use of anticoagulants: Secondary | ICD-10-CM

## 2014-07-22 DIAGNOSIS — Z5181 Encounter for therapeutic drug level monitoring: Secondary | ICD-10-CM

## 2014-07-22 DIAGNOSIS — I82431 Acute embolism and thrombosis of right popliteal vein: Secondary | ICD-10-CM | POA: Diagnosis not present

## 2014-07-22 DIAGNOSIS — I2119 ST elevation (STEMI) myocardial infarction involving other coronary artery of inferior wall: Secondary | ICD-10-CM

## 2014-07-22 LAB — POCT INR: INR: 2.4

## 2014-07-23 ENCOUNTER — Ambulatory Visit (HOSPITAL_COMMUNITY): Payer: PRIVATE HEALTH INSURANCE | Attending: Physician Assistant

## 2014-07-23 ENCOUNTER — Other Ambulatory Visit: Payer: Self-pay | Admitting: Pharmacist Clinician (PhC)/ Clinical Pharmacy Specialist

## 2014-07-23 DIAGNOSIS — M79604 Pain in right leg: Secondary | ICD-10-CM | POA: Diagnosis not present

## 2014-07-23 DIAGNOSIS — M79605 Pain in left leg: Secondary | ICD-10-CM | POA: Insufficient documentation

## 2014-07-23 MED ORDER — WARFARIN SODIUM 5 MG PO TABS: ORAL_TABLET | ORAL | Status: DC

## 2014-07-24 ENCOUNTER — Encounter: Payer: Self-pay | Admitting: Physician Assistant

## 2014-07-27 ENCOUNTER — Telehealth: Payer: Self-pay | Admitting: *Deleted

## 2014-07-27 NOTE — Telephone Encounter (Signed)
Pt notified of LE Venous and ABI results with verbal understanding to results given today. Pt said thank you.

## 2014-08-03 ENCOUNTER — Institutional Professional Consult (permissible substitution): Payer: PRIVATE HEALTH INSURANCE | Admitting: Pulmonary Disease

## 2014-08-04 LAB — PROTIME-INR: INR: 2.2 — AB (ref <=1.1)

## 2014-08-05 ENCOUNTER — Ambulatory Visit (INDEPENDENT_AMBULATORY_CARE_PROVIDER_SITE_OTHER): Payer: PRIVATE HEALTH INSURANCE | Admitting: Cardiovascular Disease

## 2014-08-05 DIAGNOSIS — I82409 Acute embolism and thrombosis of unspecified deep veins of unspecified lower extremity: Secondary | ICD-10-CM

## 2014-08-05 DIAGNOSIS — I4891 Unspecified atrial fibrillation: Secondary | ICD-10-CM

## 2014-08-05 DIAGNOSIS — Z5181 Encounter for therapeutic drug level monitoring: Secondary | ICD-10-CM

## 2014-08-05 DIAGNOSIS — I2119 ST elevation (STEMI) myocardial infarction involving other coronary artery of inferior wall: Secondary | ICD-10-CM

## 2014-08-07 ENCOUNTER — Encounter: Payer: Self-pay | Admitting: Cardiology

## 2014-08-07 ENCOUNTER — Telehealth: Payer: Self-pay | Admitting: Cardiology

## 2014-08-10 ENCOUNTER — Other Ambulatory Visit: Payer: Self-pay | Admitting: Orthopedic Surgery

## 2014-08-10 DIAGNOSIS — M4726 Other spondylosis with radiculopathy, lumbar region: Secondary | ICD-10-CM

## 2014-08-11 NOTE — Telephone Encounter (Signed)
Close encounter 

## 2014-08-13 ENCOUNTER — Ambulatory Visit
Admission: RE | Admit: 2014-08-13 | Discharge: 2014-08-13 | Disposition: A | Discharge status: Home / Self Care | Payer: PRIVATE HEALTH INSURANCE | Source: Ambulatory Visit | Attending: Orthopedic Surgery | Admitting: Orthopedic Surgery

## 2014-08-13 DIAGNOSIS — M4726 Other spondylosis with radiculopathy, lumbar region: Secondary | ICD-10-CM

## 2014-08-20 ENCOUNTER — Other Ambulatory Visit: Payer: Self-pay

## 2014-08-20 DIAGNOSIS — I10 Essential (primary) hypertension: Secondary | ICD-10-CM

## 2014-08-20 MED ORDER — LOSARTAN POTASSIUM 100 MG PO TABS: 100.0000 mg | ORAL_TABLET | Freq: Every day | ORAL | Status: DC

## 2014-09-02 ENCOUNTER — Ambulatory Visit (INDEPENDENT_AMBULATORY_CARE_PROVIDER_SITE_OTHER): Payer: PRIVATE HEALTH INSURANCE

## 2014-09-02 DIAGNOSIS — I4891 Unspecified atrial fibrillation: Secondary | ICD-10-CM

## 2014-09-02 DIAGNOSIS — I2699 Other pulmonary embolism without acute cor pulmonale: Secondary | ICD-10-CM | POA: Diagnosis not present

## 2014-09-02 DIAGNOSIS — Z5181 Encounter for therapeutic drug level monitoring: Secondary | ICD-10-CM

## 2014-09-02 DIAGNOSIS — Z7901 Long term (current) use of anticoagulants: Secondary | ICD-10-CM | POA: Diagnosis not present

## 2014-09-02 DIAGNOSIS — I82409 Acute embolism and thrombosis of unspecified deep veins of unspecified lower extremity: Secondary | ICD-10-CM | POA: Diagnosis not present

## 2014-09-02 DIAGNOSIS — I2119 ST elevation (STEMI) myocardial infarction involving other coronary artery of inferior wall: Secondary | ICD-10-CM

## 2014-09-02 LAB — POCT INR: INR: 2.7

## 2014-09-04 ENCOUNTER — Telehealth: Payer: Self-pay | Admitting: Cardiology

## 2014-09-04 ENCOUNTER — Other Ambulatory Visit: Payer: Self-pay | Admitting: Rheumatology

## 2014-09-04 DIAGNOSIS — M545 Low back pain: Secondary | ICD-10-CM

## 2014-09-04 DIAGNOSIS — G629 Polyneuropathy, unspecified: Secondary | ICD-10-CM

## 2014-09-04 NOTE — Telephone Encounter (Signed)
Received records from [facility] for appointment with Dr Percival Spanish on 11/06/14.  Records given to [facility] (medical records) for Dr Hochrein's schedule on 11/06/14. lp

## 2014-09-04 NOTE — Telephone Encounter (Signed)
Received records from [facility] for appointmenth

## 2014-09-16 ENCOUNTER — Ambulatory Visit
Admission: RE | Admit: 2014-09-16 | Discharge: 2014-09-16 | Disposition: A | Discharge status: Home / Self Care | Payer: PRIVATE HEALTH INSURANCE | Source: Ambulatory Visit | Attending: Rheumatology | Admitting: Rheumatology

## 2014-09-16 DIAGNOSIS — M5126 Other intervertebral disc displacement, lumbar region: Secondary | ICD-10-CM | POA: Diagnosis not present

## 2014-09-16 DIAGNOSIS — G629 Polyneuropathy, unspecified: Secondary | ICD-10-CM | POA: Diagnosis present

## 2014-09-16 DIAGNOSIS — M545 Low back pain: Secondary | ICD-10-CM | POA: Diagnosis present

## 2014-09-21 ENCOUNTER — Other Ambulatory Visit: Payer: Self-pay | Admitting: *Deleted

## 2014-09-21 MED ORDER — FUROSEMIDE 40 MG PO TABS: 40.0000 mg | ORAL_TABLET | Freq: Every day | ORAL | Status: DC

## 2014-10-01 ENCOUNTER — Ambulatory Visit: Payer: PRIVATE HEALTH INSURANCE | Admitting: Cardiology

## 2014-10-28 ENCOUNTER — Other Ambulatory Visit: Payer: Self-pay

## 2014-10-28 MED ORDER — NITROGLYCERIN 0.4 MG SL SUBL: 0.4000 mg | SUBLINGUAL_TABLET | SUBLINGUAL | Status: DC | PRN

## 2014-11-04 ENCOUNTER — Ambulatory Visit (INDEPENDENT_AMBULATORY_CARE_PROVIDER_SITE_OTHER): Payer: PRIVATE HEALTH INSURANCE

## 2014-11-04 DIAGNOSIS — I4891 Unspecified atrial fibrillation: Secondary | ICD-10-CM

## 2014-11-04 DIAGNOSIS — I82409 Acute embolism and thrombosis of unspecified deep veins of unspecified lower extremity: Secondary | ICD-10-CM

## 2014-11-04 DIAGNOSIS — Z7901 Long term (current) use of anticoagulants: Secondary | ICD-10-CM | POA: Diagnosis not present

## 2014-11-04 DIAGNOSIS — I2119 ST elevation (STEMI) myocardial infarction involving other coronary artery of inferior wall: Secondary | ICD-10-CM

## 2014-11-04 DIAGNOSIS — Z5181 Encounter for therapeutic drug level monitoring: Secondary | ICD-10-CM | POA: Diagnosis not present

## 2014-11-04 DIAGNOSIS — I2699 Other pulmonary embolism without acute cor pulmonale: Secondary | ICD-10-CM | POA: Diagnosis not present

## 2014-11-04 LAB — POCT INR: INR: 2.4

## 2014-11-06 ENCOUNTER — Encounter: Payer: Self-pay | Admitting: Cardiology

## 2014-11-06 ENCOUNTER — Ambulatory Visit (INDEPENDENT_AMBULATORY_CARE_PROVIDER_SITE_OTHER): Payer: PRIVATE HEALTH INSURANCE | Admitting: Cardiology

## 2014-11-06 VITALS — BP 118/70 | HR 63 | Ht 72.0 in | Wt 274.0 lb

## 2014-11-06 DIAGNOSIS — I2119 ST elevation (STEMI) myocardial infarction involving other coronary artery of inferior wall: Secondary | ICD-10-CM

## 2014-11-06 DIAGNOSIS — E785 Hyperlipidemia, unspecified: Secondary | ICD-10-CM

## 2014-11-06 DIAGNOSIS — I2510 Atherosclerotic heart disease of native coronary artery without angina pectoris: Secondary | ICD-10-CM

## 2014-11-06 NOTE — Patient Instructions (Signed)
Your physician wants you to follow-up in: 4 Months with Bobby Lindsey. You will receive a reminder letter in the mail two months in advance. If you don't receive a letter, please call our office to schedule the follow-up appointment.  Your physician recommends that you return for lab work in: Fasting Lipids and  CMP  Your physician has recommended you make the following change in your medication: STOP Plavix

## 2014-11-06 NOTE — Progress Notes (Signed)
HPI The patient presents for followup of muscle aches and CAD.  He was hospitalized in February of last year with inferior myocardial infarction. His EF at that time was slightly reduced at 40% on cath.  He did however improve to normal prior to discharge. The last echo in 12/15 demonstrated an EF of 55. The culprit for his ST elevation myocardial infarction was occluded native circumflex which was treated with a drug-eluting stent. He did have atrial fibrillation and diastolic heart failure. He was started on amiodarone but this was stopped because of Mobitz type II heart block. He was back in thesinus rhythm at discharge.   Of note persistently elevated CKs but has refused rheumatology followup in the past.    Since I last saw him he has done well.  He's not having his main muscle aches as he was having. He's not having any new chest discomfort, neck or arm discomfort. He's not having any new shortness of breath, PND or orthopnea. He has had no weight gain or edema. Not entirely clear what medicines. He's not sure followed by his endocrinologist and has seen his primary care physician recently.     Allergies  Allergen Reactions  . Simvastatin Other (See Comments)    fatigue    Current Outpatient Prescriptions  Medication Sig Dispense Refill  . atorvastatin (LIPITOR) 40 MG tablet Take 1 tablet (40 mg total) by mouth every evening. 30 tablet 1  . capsicum (ZOSTRIX) 0.075 % topical cream Apply 1 application topically 2 (two) times daily.     . clopidogrel (PLAVIX) 75 MG tablet Take 1 tablet (75 mg total) by mouth daily. 90 tablet 3  . furosemide (LASIX) 40 MG tablet Take 1 tablet (40 mg total) by mouth daily. KEEP OV. 30 tablet 1  . gabapentin (NEURONTIN) 100 MG capsule Take 100 mg by mouth 2 (two) times daily.     . insulin glargine (LANTUS) 100 UNIT/ML injection Inject 40 Units into the skin 2 (two) times daily.     . insulin lispro (HUMALOG KWIKPEN) 100 UNIT/ML injection Inject 20 Units  into the skin 3 (three) times daily before meals. UAD    . isosorbide mononitrate (IMDUR) 30 MG 24 hr tablet Take 1 tablet (30 mg total) by mouth daily. 90 tablet 3  . losartan (COZAAR) 100 MG tablet Take 1 tablet (100 mg total) by mouth daily. 30 tablet 3  . metoprolol succinate (TOPROL-XL) 25 MG 24 hr tablet Take 0.5 tablets (12.5 mg total) by mouth daily. 30 tablet 5  . nitroGLYCERIN (NITROSTAT) 0.4 MG SL tablet Place 1 tablet (0.4 mg total) under the tongue every 5 (five) minutes as needed. 25 tablet 6  . pantoprazole (PROTONIX) 40 MG tablet Take 1 tablet (40 mg total) by mouth daily. 30 tablet 6  . triamcinolone (KENALOG) 0.1 % paste Apply 1 application topically 2 (two) times daily.     Marland Kitchen warfarin (COUMADIN) 5 MG tablet Take 2-3 tablets by mouth daily as directed by coumadin clinic 70 tablet 3   No current facility-administered medications for this visit.    Past Medical History  Diagnosis Date  . HTN (hypertension)     x 15 years  . Dyslipidemia   . Diabetes mellitus     a. A1C 10.7 in 03/2013.  Marland Kitchen Peripheral neuropathy   . Charcot's joint of knee     UNSPECIFIED AREA  . Pulmonary embolism   . Deep venous thrombosis     right lower extremity  .  COPD (chronic obstructive pulmonary disease)   . Sleep apnea   . GERD (gastroesophageal reflux disease)   . Diastolic CHF     a. EF [id number] by cath, 55-60% during 03/2013 adm, required IV diuresis.  . Atrial fibrillation     a. Transient during 03/2013 admission.  . DVT, lower extremity, recurrent     a. Hx recurrent DVT per record.  . Pulmonary emboli     a. Hx PE per record.  . Bradycardia     a. Bradycardia/pauses/possible Mobitz II during 03/2013 adm. Not on BB due to this.  . Elevated CK     a. Pt has refused rheum workup in the past.  . Obesity   . Hx of cardiovascular stress test     a. Lexiscan Myoview (03/2010):  diaph atten vs inf scar, no ischemia, EF 47%; Low Risk.  Marland Kitchen Hx of echocardiogram     a. Echo (04/08/13):  Mild LVH,  EF 55-60%, restrictive physiology, severe LAE, mild reduced RVSF, mild RAE.  Marland Kitchen CAD (coronary artery disease)     a. s/p CABG 2002. b. Hx Cypher stent to the RCA. c. Inf-lat STEMI 03/2013:  LHC (04/05/13):  mLAD occluded, pD1 90, apical br of Dx occluded, CFX occluded, pOM1 90-95, RCA stents patent, diff RCA 30, S-Dx occluded, S-PDA occluded, S-OM1 40-50, L-LAD patent, EF 40% with inf HK.  PCI:  Promus (2.5 x 28) DES to mid to dist CFX.  . Leg pain     ABI 6/16:  R 1.2, L 1.1 - normal    Past Surgical History  Procedure Laterality Date  . Left knee surgery    . Coronary artery bypass graft      4 time since 2002  . Coronary angioplasty with stent placement    . Percutaneous coronary stent intervention (pci-s)  04/05/2013    Procedure: PERCUTANEOUS CORONARY STENT INTERVENTION (PCI-S);  Surgeon: Peter M Martinique, MD;  Location: [facility] CATH LAB;  Service: Cardiovascular;;  DES to native Mid cx  . Left heart catheterization with coronary/graft angiogram  04/05/2013    Procedure: LEFT HEART CATHETERIZATION WITH Beatrix Fetters;  Surgeon: Peter M Martinique, MD;  Location: [facility] CATH LAB;  Service: Cardiovascular;;    ROS:  Erectile dysfunction.  Otherwise as stated in the HPI and negative for all other systems.   PHYSICAL EXAM BP 118/70 mmHg  Pulse 63  Ht 6' (1.829 m)  Wt 274 lb (124.286 kg)  BMI 37.15 kg/m2 GENERAL:  Well appearing NECK:  No jugular venous distention, waveform within normal limits, carotid upstroke brisk and symmetric, no bruits, no thyromegaly LUNGS:  Clear to auscultation bilaterally BACK:  No CVA tenderness CHEST:  Well healed sternotomy scar. HEART:  PMI not displaced or sustained,S1 and S2 within normal limits, no S3, no S4, no clicks, no rubs, no murmurs ABD:  Flat, positive bowel sounds normal in frequency in pitch, no bruits, no rebound, no guarding, no midline pulsatile mass, no hepatomegaly, no splenomegaly, obese EXT:  2 plus pulses throughout, no cyanosis no  clubbing, charcot joints,  Mild bilateral lower extremity edema NEURO:  Nonfocal   ASSESSMENT AND PLAN  Muscle pain -  This is less pronounced than previous. I still encouraged him to discuss this with a rheumatologist who has been managing back pain. I'll defer follow-up CKs to that physician.  CAD -  The patient has no new sypmtoms.  No further cardiovascular testing is indicated.  We will continue with aggressive risk reduction and meds  as listed.  I had a long and careful discussion with patient and his wife. At this point I think he can stop his Plavix as he is already on warfarin. I think now his risk of bleeding outweighs the benefit of dual therapy.  HYPERTENSION -  The blood pressure is at target. No change in medications is indicated. We will continue with therapeutic lifestyle changes (TLC).  DYSLIPIDEMIA -  He is going to go home and check his medicines and make sure he is taking Lipitor. If he isn't going to check lipid and liver profile  DVT -  He has had massive clot burden in the past with pulmonary embolism. This summer he had some lower extremity thrombus as described previously.  I have elected to continue him on anticoagulation because of recurrent thrombus.   DIABETES - I will defer to Dr. Caryl Comes.   This has been poorly controlled because of lifestyle. We have talked about this at length.

## 2014-11-11 ENCOUNTER — Other Ambulatory Visit: Payer: Self-pay | Admitting: *Deleted

## 2014-11-11 MED ORDER — PANTOPRAZOLE SODIUM 40 MG PO TBEC: 40.0000 mg | DELAYED_RELEASE_TABLET | Freq: Every day | ORAL | Status: DC

## 2014-11-11 MED ORDER — FUROSEMIDE 40 MG PO TABS: 40.0000 mg | ORAL_TABLET | Freq: Every day | ORAL | Status: DC

## 2014-11-30 ENCOUNTER — Telehealth: Payer: Self-pay

## 2014-11-30 ENCOUNTER — Other Ambulatory Visit: Payer: Self-pay

## 2014-11-30 DIAGNOSIS — I2510 Atherosclerotic heart disease of native coronary artery without angina pectoris: Secondary | ICD-10-CM

## 2014-11-30 MED ORDER — ISOSORBIDE MONONITRATE ER 30 MG PO TB24: 30.0000 mg | ORAL_TABLET | Freq: Every day | ORAL | Status: DC

## 2014-11-30 NOTE — Telephone Encounter (Signed)
REFILL FOR IMDUR TODAY

## 2014-12-31 ENCOUNTER — Other Ambulatory Visit: Payer: Self-pay | Admitting: Cardiology

## 2014-12-31 DIAGNOSIS — I10 Essential (primary) hypertension: Secondary | ICD-10-CM

## 2014-12-31 MED ORDER — LOSARTAN POTASSIUM 100 MG PO TABS: 100.0000 mg | ORAL_TABLET | Freq: Every day | ORAL | Status: DC

## 2015-01-13 ENCOUNTER — Encounter (HOSPITAL_COMMUNITY): Payer: Self-pay | Admitting: Neurology

## 2015-01-13 ENCOUNTER — Emergency Department (HOSPITAL_COMMUNITY)
Admission: EM | Admit: 2015-01-13 | Discharge: 2015-01-13 | Disposition: A | Discharge status: Home / Self Care | Payer: PRIVATE HEALTH INSURANCE | Attending: Emergency Medicine | Admitting: Emergency Medicine

## 2015-01-13 DIAGNOSIS — I2510 Atherosclerotic heart disease of native coronary artery without angina pectoris: Secondary | ICD-10-CM | POA: Diagnosis not present

## 2015-01-13 DIAGNOSIS — I4891 Unspecified atrial fibrillation: Secondary | ICD-10-CM | POA: Insufficient documentation

## 2015-01-13 DIAGNOSIS — Z794 Long term (current) use of insulin: Secondary | ICD-10-CM | POA: Diagnosis not present

## 2015-01-13 DIAGNOSIS — Z951 Presence of aortocoronary bypass graft: Secondary | ICD-10-CM | POA: Insufficient documentation

## 2015-01-13 DIAGNOSIS — M79602 Pain in left arm: Secondary | ICD-10-CM | POA: Diagnosis present

## 2015-01-13 DIAGNOSIS — I5030 Unspecified diastolic (congestive) heart failure: Secondary | ICD-10-CM | POA: Insufficient documentation

## 2015-01-13 DIAGNOSIS — Z9861 Coronary angioplasty status: Secondary | ICD-10-CM | POA: Insufficient documentation

## 2015-01-13 DIAGNOSIS — I10 Essential (primary) hypertension: Secondary | ICD-10-CM | POA: Diagnosis not present

## 2015-01-13 DIAGNOSIS — E669 Obesity, unspecified: Secondary | ICD-10-CM | POA: Insufficient documentation

## 2015-01-13 DIAGNOSIS — Z86711 Personal history of pulmonary embolism: Secondary | ICD-10-CM | POA: Diagnosis not present

## 2015-01-13 DIAGNOSIS — J449 Chronic obstructive pulmonary disease, unspecified: Secondary | ICD-10-CM | POA: Diagnosis not present

## 2015-01-13 DIAGNOSIS — Z86718 Personal history of other venous thrombosis and embolism: Secondary | ICD-10-CM | POA: Insufficient documentation

## 2015-01-13 DIAGNOSIS — Z79899 Other long term (current) drug therapy: Secondary | ICD-10-CM | POA: Diagnosis not present

## 2015-01-13 DIAGNOSIS — G629 Polyneuropathy, unspecified: Secondary | ICD-10-CM | POA: Diagnosis not present

## 2015-01-13 DIAGNOSIS — Z7901 Long term (current) use of anticoagulants: Secondary | ICD-10-CM | POA: Insufficient documentation

## 2015-01-13 DIAGNOSIS — E785 Hyperlipidemia, unspecified: Secondary | ICD-10-CM | POA: Diagnosis not present

## 2015-01-13 DIAGNOSIS — L03114 Cellulitis of left upper limb: Secondary | ICD-10-CM | POA: Diagnosis not present

## 2015-01-13 DIAGNOSIS — K219 Gastro-esophageal reflux disease without esophagitis: Secondary | ICD-10-CM | POA: Insufficient documentation

## 2015-01-13 DIAGNOSIS — E119 Type 2 diabetes mellitus without complications: Secondary | ICD-10-CM | POA: Diagnosis not present

## 2015-01-13 DIAGNOSIS — R6883 Chills (without fever): Secondary | ICD-10-CM | POA: Diagnosis not present

## 2015-01-13 LAB — CBC WITH DIFFERENTIAL/PLATELET
Basophils Absolute: 0 10*3/uL (ref 0.0–0.1)
Basophils Relative: 0 %
Eosinophils Absolute: 0.1 10*3/uL (ref 0.0–0.7)
Eosinophils Relative: 1 %
HCT: 42.4 % (ref 39.0–52.0)
Hemoglobin: 14.3 g/dL (ref 13.0–17.0)
Lymphocytes Relative: 29 %
Lymphs Abs: 2.8 10*3/uL (ref 0.7–4.0)
MCH: 29.1 pg (ref 26.0–34.0)
MCHC: 33.7 g/dL (ref 30.0–36.0)
MCV: 86.4 fL (ref 78.0–100.0)
Monocytes Absolute: 0.6 10*3/uL (ref 0.1–1.0)
Monocytes Relative: 6 %
Neutro Abs: 6.2 10*3/uL (ref 1.7–7.7)
Neutrophils Relative %: 64 %
Platelets: 149 10*3/uL — ABNORMAL LOW (ref 150–400)
RBC: 4.91 MIL/uL (ref 4.22–5.81)
RDW: 13.5 % (ref 11.5–15.5)
WBC: 9.7 10*3/uL (ref 4.0–10.5)

## 2015-01-13 LAB — BASIC METABOLIC PANEL
Anion gap: 7 (ref 5–15)
BUN: 16 mg/dL (ref 6–20)
CO2: 26 mmol/L (ref 22–32)
Calcium: 9 mg/dL (ref 8.9–10.3)
Chloride: 103 mmol/L (ref 101–111)
Creatinine, Ser: 1.03 mg/dL (ref 0.61–1.24)
GFR calc Af Amer: >60 mL/min (ref >60)
GFR calc non Af Amer: >60 mL/min (ref >60)
Glucose, Bld: 233 mg/dL — ABNORMAL HIGH (ref 65–99)
Potassium: 4.2 mmol/L (ref 3.5–5.1)
Sodium: 136 mmol/L (ref 135–145)

## 2015-01-13 LAB — PROTIME-INR
INR: 2.18 — ABNORMAL HIGH (ref 0.00–1.49)
Prothrombin Time: 24 seconds — ABNORMAL HIGH (ref 11.6–15.2)

## 2015-01-13 MED ORDER — CLINDAMYCIN HCL 300 MG PO CAPS: 300.0000 mg | ORAL_CAPSULE | Freq: Three times a day (TID) | ORAL | Status: DC

## 2015-01-13 MED ORDER — CLINDAMYCIN PHOSPHATE 900 MG/50ML IV SOLN
900.0000 mg | Freq: Once | INTRAVENOUS | Status: AC
  Administered 2015-01-13: 900 mg via INTRAVENOUS
  Filled 2015-01-13: qty 50

## 2015-01-13 NOTE — Discharge Instructions (Signed)
Decrease your Coumadin from 10 mg, to 7.5 mg(1&1/2 tab) each night. Recheck your Coumadin with your Coumadin clinic on Monday to ensure that it is not too high, or too low. Expect slow improvement of the infection. If not improving by Saturday recheck here. With any sudden worsening fever shakes chills or other symptoms please recheck here as well.    Cellulitis Cellulitis is an infection of the skin and the tissue beneath it. The infected area is usually red and tender. Cellulitis occurs most often in the arms and lower legs.  CAUSES  Cellulitis is caused by bacteria that enter the skin through cracks or cuts in the skin. The most common types of bacteria that cause cellulitis are staphylococci and streptococci. SIGNS AND SYMPTOMS   Redness and warmth.  Swelling.  Tenderness or pain.  Fever. DIAGNOSIS  Your health care provider can usually determine what is wrong based on a physical exam. Blood tests may also be done. TREATMENT  Treatment usually involves taking an antibiotic medicine. HOME CARE INSTRUCTIONS   Take your antibiotic medicine as directed by your health care provider. Finish the antibiotic even if you start to feel better.  Keep the infected arm or leg elevated to reduce swelling.  Apply a warm cloth to the affected area up to 4 times per day to relieve pain.  Take medicines only as directed by your health care provider.  Keep all follow-up visits as directed by your health care provider. SEEK MEDICAL CARE IF:   You notice red streaks coming from the infected area.  Your red area gets larger or turns dark in color.  Your bone or joint underneath the infected area becomes painful after the skin has healed.  Your infection returns in the same area or another area.  You notice a swollen bump in the infected area.  You develop new symptoms.  You have a fever. SEEK IMMEDIATE MEDICAL CARE IF:   You feel very sleepy.  You develop vomiting or  diarrhea.  You have a general ill feeling (malaise) with muscle aches and pains.   This information is not intended to replace advice given to you by your health care provider. Make sure you discuss any questions you have with your health care provider.   Document Released: 11/09/2004 Document Revised: 10/21/2014 Document Reviewed: 04/17/2011 Elsevier Interactive Patient Education [employer].

## 2015-01-13 NOTE — ED Notes (Addendum)
Pt reporting redness and swelling to left posterior arm from elbow to wrist. Redness has been spreading and it is itchy. Skin is warm to touch. Has been going on for several days. Has hx of same to leg in the past .

## 2015-01-13 NOTE — ED Provider Notes (Signed)
CSN: [id number]     Arrival date & time 01/13/15  1831 History   First MD Initiated Contact with Patient 01/13/15 2031     Chief Complaint  Patient presents with  . Arm Problem      HPI  She presents for evaluation of pain and swelling of the left arm. Started having some tenderness around his olecranon several days ago. Has redness onto his forearm and medial upper arm as well. History of DVT and PE. He is anticoagulated with Coumadin. His last INR was normal 4 weeks ago. Takes 10 mg daily. No fever shakes chills until tonight. He states he felt "just a little" I got here". No right ears. No nausea vomiting. No prostration. Denies chest pain shortness of breath dizziness lightheadedness or other symptoms  Past Medical History  Diagnosis Date  . HTN (hypertension)     x 15 years  . Dyslipidemia   . Diabetes mellitus     a. A1C 10.7 in 03/2013.  Marland Kitchen Peripheral neuropathy ([place])   . Charcot's joint of knee     UNSPECIFIED AREA  . Pulmonary embolism ([place])   . Deep venous thrombosis (H[facility])     right lower extremity  . COPD (chronic obstructive pulmonary disease) ([place])   . Sleep apnea   . GERD (gastroesophageal reflux disease)   . Diastolic CHF ([place])     a. EF 40% by cath, 55-60% during 03/2013 adm, required IV diuresis.  . Atrial fibrillation ([place])     a. Transient during 03/2013 admission.  . DVT, lower extremity, recurrent ([place])     a. Hx recurrent DVT per record.  . Pulmonary emboli ([place])     a. Hx PE per record.  . Bradycardia     a. Bradycardia/pauses/possible Mobitz II during 03/2013 adm. Not on BB due to this.  . Elevated CK     a. Pt has refused rheum workup in the past.  . Obesity   . Hx of cardiovascular stress test     a. Lexiscan Myoview (03/2010):  diaph atten vs inf scar, no ischemia, EF 47%; Low Risk.  Marland Kitchen Hx of echocardiogram     a. Echo (04/08/13):  Mild LVH, EF 55-60%, restrictive physiology, severe LAE, mild reduced RVSF, mild RAE.  Marland Kitchen CAD (coronary artery disease)       a. s/p CABG 2002. b. Hx Cypher stent to the RCA. c. Inf-lat STEMI 03/2013:  LHC (04/05/13):  mLAD occluded, pD1 90, apical br of Dx occluded, CFX occluded, pOM1 90-95, RCA stents patent, diff RCA 30, S-Dx occluded, S-PDA occluded, S-OM1 40-50, L-LAD patent, EF 40% with inf HK.  PCI:  Promus (2.5 x 28) DES to mid to dist CFX.  . Leg pain     ABI 6/16:  R 1.2, L 1.1 - normal   Past Surgical History  Procedure Laterality Date  . Left knee surgery    . Coronary artery bypass graft      4 time since 2002  . Coronary angioplasty with stent placement    . Percutaneous coronary stent intervention (pci-s)  04/05/2013    Procedure: PERCUTANEOUS CORONARY STENT INTERVENTION (PCI-S);  Surgeon: Peter M Martinique, MD;  Location: [facility] CATH LAB;  Service: Cardiovascular;;  DES to native Mid cx  . Left heart catheterization with coronary/graft angiogram  04/05/2013    Procedure: LEFT HEART CATHETERIZATION WITH Beatrix Fetters;  Surgeon: Peter M Martinique, MD;  Location: [facility] CATH LAB;  Service: Cardiovascular;;   Family History  Problem Relation  Age of Onset  . Heart disease Mother   . Heart attack Neg Hx   . Hypertension Mother   . Hypertension Sister   . Cancer Father   . Stroke Neg Hx    Social History  Substance Use Topics  . Smoking status: Never Smoker   . Smokeless tobacco: Never Used  . Alcohol Use: Yes     Comment: rare    Review of Systems  Constitutional: Positive for chills. Negative for fever, diaphoresis, appetite change and fatigue.  HENT: Negative for mouth sores, sore throat and trouble swallowing.   Eyes: Negative for visual disturbance.  Respiratory: Negative for cough, chest tightness, shortness of breath and wheezing.   Cardiovascular: Negative for chest pain.  Gastrointestinal: Negative for nausea, vomiting, abdominal pain, diarrhea and abdominal distention.  Endocrine: Negative for polydipsia, polyphagia and polyuria.  Genitourinary: Negative for dysuria, frequency and  hematuria.  Musculoskeletal: Negative for gait problem.       Arm pain and swelling. Redness. Tenderness over the bursa.  Skin: Negative for color change, pallor and rash.  Neurological: Negative for dizziness, syncope, light-headedness and headaches.  Hematological: Does not bruise/bleed easily.  Psychiatric/Behavioral: Negative for behavioral problems and confusion.      Allergies  Simvastatin  Home Medications   Prior to Admission medications   Medication Sig Start Date End Date Taking? Authorizing Provider  furosemide (LASIX) 40 MG tablet Take 1 tablet (40 mg total) by mouth daily. KEEP OV. 11/11/14  Yes Mihai Croitoru, MD  gabapentin (NEURONTIN) 100 MG capsule Take 100 mg by mouth 2 (two) times daily.  06/15/14 06/15/15 Yes Historical Provider, MD  insulin glargine (LANTUS) 100 UNIT/ML injection Inject 40 Units into the skin 2 (two) times daily.    Yes Historical Provider, MD  insulin lispro (HUMALOG KWIKPEN) 100 UNIT/ML injection Inject 20 Units into the skin 3 (three) times daily before meals. UAD   Yes Historical Provider, MD  isosorbide mononitrate (IMDUR) 30 MG 24 hr tablet Take 1 tablet (30 mg total) by mouth daily. 11/30/14  Yes Scott T Kathlen Mody, PA-C  losartan (COZAAR) 100 MG tablet Take 1 tablet (100 mg total) by mouth daily. 12/31/14  Yes Minus Breeding, MD  metoprolol succinate (TOPROL-XL) 25 MG 24 hr tablet Take 0.5 tablets (12.5 mg total) by mouth daily. 04/29/14  Yes Minus Breeding, MD  nitroGLYCERIN (NITROSTAT) 0.4 MG SL tablet Place 1 tablet (0.4 mg total) under the tongue every 5 (five) minutes as needed. 10/28/14  Yes Minus Breeding, MD  pantoprazole (PROTONIX) 40 MG tablet Take 1 tablet (40 mg total) by mouth daily. 11/11/14  Yes Minus Breeding, MD  warfarin (COUMADIN) 5 MG tablet Take 10 mg by mouth daily.   Yes Historical Provider, MD  atorvastatin (LIPITOR) 40 MG tablet Take 1 tablet (40 mg total) by mouth every evening. Patient not taking: Reported on 01/13/2015 03/03/14    Minus Breeding, MD  clindamycin (CLEOCIN) 300 MG capsule Take 1 capsule (300 mg total) by mouth 3 (three) times daily. 01/13/15   Tanna Furry, MD  warfarin (COUMADIN) 5 MG tablet Take 2-3 tablets by mouth daily as directed by coumadin clinic Patient not taking: Reported on 01/13/2015 07/23/14   Minus Breeding, MD   BP 135/82 mmHg  Pulse 60  Temp(Src) 99.3 F (37.4 C) (Oral)  Resp 18  SpO2 95% Physical Exam  Constitutional: He is oriented to person, place, and time. He appears well-developed and well-nourished. No distress.  HENT:  Head: Normocephalic.  Eyes: Conjunctivae are normal.  Pupils are equal, round, and reactive to light. No scleral icterus.  Neck: Normal range of motion. Neck supple. No thyromegaly present.  Cardiovascular: Normal rate and regular rhythm.  Exam reveals no gallop and no friction rub.   No murmur heard. Pulmonary/Chest: Effort normal and breath sounds normal. No respiratory distress. He has no wheezes. He has no rales.  Abdominal: Soft. Bowel sounds are normal. He exhibits no distension. There is no tenderness. There is no rebound.  Musculoskeletal: Normal range of motion.       Arms: Neurological: He is alert and oriented to person, place, and time.  Skin: Skin is warm and dry. No rash noted.  Psychiatric: He has a normal mood and affect. His behavior is normal.    ED Course  Procedures (including critical care time) Labs Review Labs Reviewed  CBC WITH DIFFERENTIAL/PLATELET - Abnormal; Notable for the following:    Platelets 149 (*)    All other components within normal limits  BASIC METABOLIC PANEL - Abnormal; Notable for the following:    Glucose, Bld 233 (*)    All other components within normal limits  PROTIME-INR - Abnormal; Notable for the following:    Prothrombin Time 24.0 (*)    INR 2.18 (*)    All other components within normal limits    Imaging Review No results found. I have personally reviewed and evaluated these images and lab  results as part of my medical decision-making.   EKG Interpretation None      MDM   Final diagnoses:  Cellulitis of left upper extremity    INR 2.1. Clinically has enlarged tender bursa and cellulitis. Given IV clindamycin. We discussed slight reduction of his Coumadin dose while on clindamycin and INR check Friday or Monday with primary care. Recheck here with any failure to improve over the next 48-72 hours. Earlier with any worsening.    Tanna Furry, MD 01/20/15 [phone]

## 2015-01-15 DIAGNOSIS — M71129 Other infective bursitis, unspecified elbow: Secondary | ICD-10-CM | POA: Insufficient documentation

## 2015-01-15 DIAGNOSIS — L03119 Cellulitis of unspecified part of limb: Secondary | ICD-10-CM | POA: Insufficient documentation

## 2015-02-02 ENCOUNTER — Other Ambulatory Visit: Payer: Self-pay

## 2015-02-02 DIAGNOSIS — I10 Essential (primary) hypertension: Secondary | ICD-10-CM

## 2015-02-02 MED ORDER — LOSARTAN POTASSIUM 100 MG PO TABS
100.0000 mg | ORAL_TABLET | Freq: Every day | ORAL | Status: DC
Start: 2015-02-02 — End: 2015-03-01

## 2015-02-26 ENCOUNTER — Telehealth: Payer: Self-pay | Admitting: Physician Assistant

## 2015-02-26 DIAGNOSIS — I10 Essential (primary) hypertension: Secondary | ICD-10-CM

## 2015-02-26 NOTE — Telephone Encounter (Signed)
PT NEEDS REFILL OF LARSARTAN TO [facility] IN [place] , PT MADE APPT 03-08-15

## 2015-03-01 MED ORDER — LOSARTAN POTASSIUM 100 MG PO TABS: 100.0000 mg | ORAL_TABLET | Freq: Every day | ORAL | Status: DC

## 2015-03-01 NOTE — Telephone Encounter (Signed)
Rx(s) sent to pharmacy electronically.  

## 2015-03-03 ENCOUNTER — Ambulatory Visit (INDEPENDENT_AMBULATORY_CARE_PROVIDER_SITE_OTHER): Payer: Managed Care, Other (non HMO) | Admitting: *Deleted

## 2015-03-03 DIAGNOSIS — I4891 Unspecified atrial fibrillation: Secondary | ICD-10-CM

## 2015-03-03 DIAGNOSIS — I2699 Other pulmonary embolism without acute cor pulmonale: Secondary | ICD-10-CM | POA: Diagnosis not present

## 2015-03-03 DIAGNOSIS — I2119 ST elevation (STEMI) myocardial infarction involving other coronary artery of inferior wall: Secondary | ICD-10-CM | POA: Diagnosis not present

## 2015-03-03 DIAGNOSIS — Z7901 Long term (current) use of anticoagulants: Secondary | ICD-10-CM

## 2015-03-03 DIAGNOSIS — Z5181 Encounter for therapeutic drug level monitoring: Secondary | ICD-10-CM | POA: Diagnosis not present

## 2015-03-03 DIAGNOSIS — I82409 Acute embolism and thrombosis of unspecified deep veins of unspecified lower extremity: Secondary | ICD-10-CM | POA: Diagnosis not present

## 2015-03-03 LAB — POCT INR: INR: 1.6

## 2015-03-04 ENCOUNTER — Emergency Department (HOSPITAL_COMMUNITY): Payer: Managed Care, Other (non HMO)

## 2015-03-04 ENCOUNTER — Encounter (HOSPITAL_COMMUNITY): Payer: Self-pay

## 2015-03-04 ENCOUNTER — Emergency Department (HOSPITAL_COMMUNITY)
Admission: EM | Admit: 2015-03-04 | Discharge: 2015-03-04 | Disposition: A | Discharge status: Home / Self Care | Payer: Managed Care, Other (non HMO) | Attending: Emergency Medicine | Admitting: Emergency Medicine

## 2015-03-04 DIAGNOSIS — Z79899 Other long term (current) drug therapy: Secondary | ICD-10-CM | POA: Insufficient documentation

## 2015-03-04 DIAGNOSIS — Z86711 Personal history of pulmonary embolism: Secondary | ICD-10-CM | POA: Diagnosis not present

## 2015-03-04 DIAGNOSIS — Y998 Other external cause status: Secondary | ICD-10-CM | POA: Diagnosis not present

## 2015-03-04 DIAGNOSIS — E785 Hyperlipidemia, unspecified: Secondary | ICD-10-CM | POA: Diagnosis not present

## 2015-03-04 DIAGNOSIS — J449 Chronic obstructive pulmonary disease, unspecified: Secondary | ICD-10-CM | POA: Diagnosis not present

## 2015-03-04 DIAGNOSIS — Y9389 Activity, other specified: Secondary | ICD-10-CM | POA: Diagnosis not present

## 2015-03-04 DIAGNOSIS — Y9289 Other specified places as the place of occurrence of the external cause: Secondary | ICD-10-CM | POA: Insufficient documentation

## 2015-03-04 DIAGNOSIS — E119 Type 2 diabetes mellitus without complications: Secondary | ICD-10-CM | POA: Insufficient documentation

## 2015-03-04 DIAGNOSIS — I5030 Unspecified diastolic (congestive) heart failure: Secondary | ICD-10-CM | POA: Insufficient documentation

## 2015-03-04 DIAGNOSIS — W06XXXA Fall from bed, initial encounter: Secondary | ICD-10-CM | POA: Insufficient documentation

## 2015-03-04 DIAGNOSIS — Z792 Long term (current) use of antibiotics: Secondary | ICD-10-CM | POA: Diagnosis not present

## 2015-03-04 DIAGNOSIS — Z7901 Long term (current) use of anticoagulants: Secondary | ICD-10-CM | POA: Insufficient documentation

## 2015-03-04 DIAGNOSIS — E669 Obesity, unspecified: Secondary | ICD-10-CM | POA: Insufficient documentation

## 2015-03-04 DIAGNOSIS — S29001A Unspecified injury of muscle and tendon of front wall of thorax, initial encounter: Secondary | ICD-10-CM | POA: Diagnosis present

## 2015-03-04 DIAGNOSIS — I10 Essential (primary) hypertension: Secondary | ICD-10-CM | POA: Diagnosis not present

## 2015-03-04 DIAGNOSIS — Z86718 Personal history of other venous thrombosis and embolism: Secondary | ICD-10-CM | POA: Insufficient documentation

## 2015-03-04 DIAGNOSIS — S2231XA Fracture of one rib, right side, initial encounter for closed fracture: Secondary | ICD-10-CM | POA: Diagnosis not present

## 2015-03-04 DIAGNOSIS — Z794 Long term (current) use of insulin: Secondary | ICD-10-CM | POA: Insufficient documentation

## 2015-03-04 DIAGNOSIS — I2510 Atherosclerotic heart disease of native coronary artery without angina pectoris: Secondary | ICD-10-CM | POA: Diagnosis not present

## 2015-03-04 MED ORDER — OXYCODONE HCL 5 MG PO TABS: 5.0000 mg | ORAL_TABLET | ORAL | Status: DC | PRN

## 2015-03-04 MED ORDER — LIDOCAINE 5 % EX PTCH: 1.0000 | MEDICATED_PATCH | CUTANEOUS | Status: DC

## 2015-03-04 MED ORDER — OXYCODONE-ACETAMINOPHEN 5-325 MG PO TABS
2.0000 | ORAL_TABLET | Freq: Once | ORAL | Status: AC
  Administered 2015-03-04: 2 via ORAL
  Filled 2015-03-04: qty 2

## 2015-03-04 NOTE — Discharge Instructions (Signed)

## 2015-03-04 NOTE — ED Notes (Signed)
Patient here with right sided rib pain after falling out of bed and having increased pain, no distress
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
## 2015-03-04 NOTE — ED Notes (Signed)
See PA assessment notes.

## 2015-03-07 NOTE — Progress Notes (Signed)
Cardiology Office Note:    Date:  03/08/2015   ID:  Bobby Lindsey, DOB [birth year], MRN [id number]  PCP:  Lynnea Ferrier, MD  Cardiologist:  Dr. Rollene Rotunda   Electrophysiologist:  n/a  Chief Complaint  Patient presents with  . Coronary Artery Disease    Follow up    History of Present Illness:    Bobby Lindsey is a 60 y.o. male with a hx of CAD s/p CABG 2002 and prior DES to RCA, recurrent pulmonary embolism/DVT, HTN, HL, DM, OSA. He was admitted to [facility] 03/2013 with an Inferior STEMI. Emergent LHC demonstrated patent S-OM1 and L-LAD, occluded S-Dx and S-PDA. EF was 40% with inf wall motion abnormality. Echo demonstrated normal LVF prior to d/c. Culprit for STEMI was an occluded native CFX which was treated with DES. He has a hx of elevated CK. Statin was resumed with plans to follow LFTs and CPK. Hospitalization was c/b AFib and a/c diastolic CHF. He was started on Amiodarone. He developed Mobitz II and beta blocker was d/c'd. He was d/c in NSR. He stopped the Amiodarone on his own as he thought it was causing dyspnea.   He has c/o myalgias and has had elevated CK levels in the past.  Referral to Rheumatology has been recommended but he has declined.  FU echo in 12/15 demonstrated normal LVF and mild pulmonary HTN.   Last seen by Dr. Rollene Rotunda 9/16.  Plavix was DC'd.    Returns for FU.  He is overall doing well.  He did fall out of bed and fractured a rib.  He did have one episode of chest pain a couple of months ago relieved by NTG.  No associated symptoms.  No recurrent chest symptoms. He has noted a harder time breathing since he fractured his rib.  Otherwise, no increased DOE.  He denies PND.  LE edema may be a little worse.  No chest pain.  No bleeding issues.  He saw Dr. Elijah Birk at [facility].  His repeat CK level was > 600.  He has remained off Lipitor.     Past Medical History  Diagnosis Date  . HTN (hypertension)     x 15 years  . Dyslipidemia    . Diabetes mellitus     a. A1C 10.7 in 03/2013.  Marland Kitchen Peripheral neuropathy (HCC)   . Charcot's joint of knee     UNSPECIFIED AREA  . Pulmonary embolism (HCC)   . Deep venous thrombosis (HCC)     right lower extremity  . COPD (chronic obstructive pulmonary disease) (HCC)   . Sleep apnea   . GERD (gastroesophageal reflux disease)   . Diastolic CHF (HCC)     a. EF 40% by cath, 55-60% during 03/2013 adm, required IV diuresis.  . Atrial fibrillation (HCC)     a. Transient during 03/2013 admission.  . DVT, lower extremity, recurrent (HCC)     a. Hx recurrent DVT per record.  . Pulmonary emboli (HCC)     a. Hx PE per record.  . Bradycardia     a. Bradycardia/pauses/possible Mobitz II during 03/2013 adm. Not on BB due to this.  . Elevated CK     a. Pt has refused rheum workup in the past.  . Obesity   . Hx of cardiovascular stress test     a. Lexiscan Myoview (03/2010):  diaph atten vs inf scar, no ischemia, EF 47%; Low Risk.  Marland Kitchen Hx of echocardiogram  a. Echo (04/08/13):  Mild LVH, EF 55-60%, restrictive physiology, severe LAE, mild reduced RVSF, mild RAE.  Marland Kitchen CAD (coronary artery disease)     a. s/p CABG 2002. b. Hx Cypher stent to the RCA. c. Inf-lat STEMI 03/2013:  LHC (04/05/13):  mLAD occluded, pD1 90, apical br of Dx occluded, CFX occluded, pOM1 90-95, RCA stents patent, diff RCA 30, S-Dx occluded, S-PDA occluded, S-OM1 40-50, L-LAD patent, EF 40% with inf HK.  PCI:  Promus (2.5 x 28) DES to mid to dist CFX.  . Leg pain     ABI 6/16:  R 1.2, L 1.1 - normal    Past Surgical History  Procedure Laterality Date  . Left knee surgery    . Coronary artery bypass graft      4 time since 2002  . Coronary angioplasty with stent placement    . Percutaneous coronary stent intervention (pci-s)  04/05/2013    Procedure: PERCUTANEOUS CORONARY STENT INTERVENTION (PCI-S);  Surgeon: Peter M Swaziland, MD;  Location: [facility] CATH LAB;  Service: Cardiovascular;;  DES to native Mid cx  . Left heart  catheterization with coronary/graft angiogram  04/05/2013    Procedure: LEFT HEART CATHETERIZATION WITH Isabel Caprice;  Surgeon: Peter M Swaziland, MD;  Location: [facility] CATH LAB;  Service: Cardiovascular;;    Current Medications: Outpatient Prescriptions Prior to Visit  Medication Sig Dispense Refill  . furosemide (LASIX) 40 MG tablet Take 1 tablet (40 mg total) by mouth daily. KEEP OV. 30 tablet 5  . gabapentin (NEURONTIN) 100 MG capsule Take 100 mg by mouth 2 (two) times daily.     . insulin glargine (LANTUS) 100 UNIT/ML injection Inject 40 Units into the skin 2 (two) times daily.     . insulin lispro (HUMALOG KWIKPEN) 100 UNIT/ML injection Inject 20 Units into the skin 3 (three) times daily before meals. UAD    . isosorbide mononitrate (IMDUR) 30 MG 24 hr tablet Take 1 tablet (30 mg total) by mouth daily. 90 tablet 3  . lidocaine (LIDODERM) 5 % Place 1 patch onto the skin daily. Remove & Discard patch within 12 hours or as directed by MD 30 patch 0  . metoprolol succinate (TOPROL-XL) 25 MG 24 hr tablet Take 0.5 tablets (12.5 mg total) by mouth daily. 30 tablet 5  . nitroGLYCERIN (NITROSTAT) 0.4 MG SL tablet Place 1 tablet (0.4 mg total) under the tongue every 5 (five) minutes as needed. 25 tablet 6  . oxyCODONE (OXY IR/ROXICODONE) 5 MG immediate release tablet Take 1 tablet (5 mg total) by mouth every 4 (four) hours as needed for severe pain. 30 tablet 0  . pantoprazole (PROTONIX) 40 MG tablet Take 1 tablet (40 mg total) by mouth daily. 30 tablet 6  . losartan (COZAAR) 100 MG tablet Take 1 tablet (100 mg total) by mouth daily. 30 tablet 8  . atorvastatin (LIPITOR) 40 MG tablet Take 1 tablet (40 mg total) by mouth every evening. (Patient not taking: Reported on 01/13/2015) 30 tablet 1  . clindamycin (CLEOCIN) 300 MG capsule Take 1 capsule (300 mg total) by mouth 3 (three) times daily. (Patient not taking: Reported on 03/08/2015) 30 capsule 0  . warfarin (COUMADIN) 5 MG tablet Take 2-3  tablets by mouth daily as directed by coumadin clinic (Patient not taking: Reported on 03/08/2015) 70 tablet 3  . warfarin (COUMADIN) 5 MG tablet Take 10 mg by mouth daily. Reported on 03/08/2015     No facility-administered medications prior to visit.  Allergies:   Simvastatin   Social History   Social History  . Marital Status: Married    Spouse Name: N/A  . Number of Children: N/A  . Years of Education: N/A   Occupational History  . CONSTRUCTION     General contractor   Social History Main Topics  . Smoking status: Never Smoker   . Smokeless tobacco: Never Used  . Alcohol Use: Yes     Comment: rare  . Drug Use: No  . Sexual Activity: Yes   Other Topics Concern  . 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      Family History:  The patient's family history includes Cancer in his father; Heart disease in his mother; Hypertension in his mother and sister. There is no history of Heart attack or Stroke.   ROS:   Please see the history of present illness.    Review of Systems  Constitution: Positive for malaise/fatigue.  Cardiovascular: Positive for dyspnea on exertion and leg swelling.  Respiratory: Positive for shortness of breath.   All other systems reviewed and are negative.   Physical Exam:    VS:  BP 122/60 mmHg  Pulse 64  Ht 6' (1.829 m)  Wt 272 lb (123.378 kg)  BMI 36.88 kg/m2   GEN: Well nourished, well developed, in no acute distress HEENT: normal Neck: no JVD, no masses Cardiac: Normal S1/S2, RRR; no murmurs; brawny 1+ bilateral LE edema.   Respiratory:  clear to auscultation bilaterally; no wheezing, rhonchi or rales GI: soft, nontender  MS: no deformity or atrophy Skin: warm and dry, no rash Neuro: no focal deficits  Psych: Alert and oriented x 3, normal affect  Wt Readings from Last 3 Encounters:  03/08/15 272 lb (123.378 kg)  03/04/15 269 lb (122.018 kg)  11/06/14 274 lb (124.286 kg)       Studies/Labs Reviewed:    EKG:  EKG is  ordered today.  The ekg ordered today demonstrates NSR, HR 65, normal axis, NSSTTW changes, QTc 440 ms, no change from prior tracing.  Recent Labs: 01/13/2015: BUN 16; Creatinine, Ser 1.03; Hemoglobin 14.3; Platelets 149*; Potassium 4.2; Sodium 136   Recent Lipid Panel    Component Value Date/Time   CHOL 109 03/11/2008 0930   TRIG 105 03/11/2008 0930   HDL 30.1* 03/11/2008 0930   CHOLHDL 3.6 CALC 03/11/2008 0930   VLDL 21 03/11/2008 0930   LDLCALC 58 03/11/2008 0930    Additional studies/ records that were reviewed today include:   Echocardiogram 01/29/14 Mod LVH, EF 55-60%, Aortic sclerosis, trivial AI, MAC, mild LAE, PASP 36 mmHg    ASSESSMENT:    1. Coronary artery disease involving native coronary artery of native heart without angina pectoris   2. Paroxysmal atrial fibrillation (HCC)   3. Chronic diastolic CHF (congestive heart failure) (HCC)   4. Essential hypertension   5. Hyperlipidemia   6. History of pulmonary embolism   7. OSA (obstructive sleep apnea)     PLAN:    In order of problems listed above:  1.CAD -  s/p CABG. He is s/p DES to the CFX in 03/2013. LHC in 03/2013 demonstrated an occluded S-Dx with 90% stenosis in the D1 and an occluded apical branch of the Dx. He had one episode of chest pain without recurrence.  ECG is unchanged. He is now off of Plavix and remains on Coumadin. Continue nitrates, Toprol-XL, Losartan.  I will check with his Rheumatologist to see  if we can resume statin Rx.  2.Paroxysmal Atrial Fibrillation: Maintaining NSR. He needs to remain on Coumadin long-term due to prior hx of PE.   3.Chronic Diastolic CHF: Volume appears stable. Continue current Rx.He can take extra Lasix if weight or edema increases.   4. HTN: BP at target.   5. Hyperlipidemia: I will check with his rheumatologist to see if we can resume Lipitor.  If we cannot, I will refer him to Lipid clinic to  consider PCSK-9 inhibitor Rx.   6. Hx of Pulmonary Embolism: He needs to remain on long term coumadin.   7. OSA - He cannot tolerate CPAP.  He does not see anyone for sleep medicine.  Will refer to Dr. Armanda Magic.     Medication Adjustments/Labs and Tests Ordered: Current medicines are reviewed at length with the patient today.  Concerns regarding medicines are outlined above.  Medication changes, Labs and Tests ordered today are outlined in the Patient Instructions noted below. Patient Instructions  Medication Instructions:  A REFILL FOR LOSARTAN HAS BEEN SENT TO THE PHARMACY FOR YOU  Labwork: NONE  Testing/Procedures: NONE  Follow-Up: 1. YOU ARE BEING REFERRED TO DR. Mayford Knife; DX SLEEP APNEA  2. Arfa Lamarca, PAC 4 MONTHS  Any Other Special Instructions Will Be Listed Below (If Applicable).     If you need a refill on your cardiac medications before your next appointment, please call your pharmacy.       Signed, Eladio Meda, PA-C  03/08/2015 4:36 PM    [facility] [address] [place], [place], [place]  [address] Phone: [phone]; Fax: [phone]

## 2015-03-08 ENCOUNTER — Ambulatory Visit (INDEPENDENT_AMBULATORY_CARE_PROVIDER_SITE_OTHER): Payer: Managed Care, Other (non HMO) | Admitting: Physician Assistant

## 2015-03-08 ENCOUNTER — Encounter: Payer: Self-pay | Admitting: Physician Assistant

## 2015-03-08 VITALS — BP 122/60 | HR 64 | Ht 72.0 in | Wt 272.0 lb

## 2015-03-08 DIAGNOSIS — Z86711 Personal history of pulmonary embolism: Secondary | ICD-10-CM

## 2015-03-08 DIAGNOSIS — I10 Essential (primary) hypertension: Secondary | ICD-10-CM | POA: Diagnosis not present

## 2015-03-08 DIAGNOSIS — E785 Hyperlipidemia, unspecified: Secondary | ICD-10-CM

## 2015-03-08 DIAGNOSIS — G4733 Obstructive sleep apnea (adult) (pediatric): Secondary | ICD-10-CM

## 2015-03-08 DIAGNOSIS — I480 Paroxysmal atrial fibrillation: Secondary | ICD-10-CM | POA: Diagnosis not present

## 2015-03-08 DIAGNOSIS — I5032 Chronic diastolic (congestive) heart failure: Secondary | ICD-10-CM | POA: Diagnosis not present

## 2015-03-08 DIAGNOSIS — I2510 Atherosclerotic heart disease of native coronary artery without angina pectoris: Secondary | ICD-10-CM

## 2015-03-08 MED ORDER — LOSARTAN POTASSIUM 100 MG PO TABS: 100.0000 mg | ORAL_TABLET | Freq: Every day | ORAL | Status: DC

## 2015-03-08 NOTE — Patient Instructions (Signed)
Medication Instructions:  A REFILL FOR LOSARTAN HAS BEEN SENT TO THE PHARMACY FOR YOU  Labwork: NONE  Testing/Procedures: NONE  Follow-Up: 1. YOU ARE BEING REFERRED TO DR. Radford Pax; DX SLEEP APNEA  2. SCOTT Mertz, PAC 4 MONTHS  Any Other Special Instructions Will Be Listed Below (If Applicable).     If you need a refill on your cardiac medications before your next appointment, please call your pharmacy.

## 2015-03-09 ENCOUNTER — Other Ambulatory Visit: Payer: Self-pay

## 2015-03-09 DIAGNOSIS — I5032 Chronic diastolic (congestive) heart failure: Secondary | ICD-10-CM

## 2015-03-09 MED ORDER — METOPROLOL SUCCINATE ER 25 MG PO TB24: 12.5000 mg | ORAL_TABLET | Freq: Every day | ORAL | Status: DC

## 2015-03-09 NOTE — Telephone Encounter (Signed)
Liliane Shi, PA-C at 03/07/2015 10:47 PM  metoprolol succinate (TOPROL-XL) 25 MG 24 hr tabletTake 0.5 tablets (12.5 mg total) by mouth daily 1.CAD - s/p CABG. He is s/p DES to the CFX in 03/2013. LHC in 03/2013 demonstrated an occluded S-Dx with 90% stenosis in the D1 and an occluded apical branch of the Dx. He had one episode of chest pain without recurrence. ECG is unchanged. He is now off of Plavix and remains on Coumadin. Continue nitrates, Toprol-XL, Losartan. I will check with his Rheumatologist to see if we can resume statin Rx.

## 2015-03-15 ENCOUNTER — Telehealth: Payer: Self-pay | Admitting: *Deleted

## 2015-03-15 DIAGNOSIS — E785 Hyperlipidemia, unspecified: Secondary | ICD-10-CM

## 2015-03-15 DIAGNOSIS — E7849 Other hyperlipidemia: Secondary | ICD-10-CM | POA: Insufficient documentation

## 2015-03-15 DIAGNOSIS — I2510 Atherosclerotic heart disease of native coronary artery without angina pectoris: Secondary | ICD-10-CM

## 2015-03-15 NOTE — Telephone Encounter (Signed)
Did Dr. Marcelline Deist want him to remain off of Statins b/c elevated CK levels in the past? If yes >>  refer to Lipid clinic for further recommendations. Bobby Dopp, PA-C   03/15/2015 5:08 PM

## 2015-03-15 NOTE — Telephone Encounter (Signed)
Lmptcb to advise pt that  we had checked with Dr. Marcelline Deist office in regards if ok to take Lipitor. Due to intolerance to statins and elevated CK Bobby Dopp, PA will refer pt to Lipid Clinic in our office. Will [facility] call pt with appt.

## 2015-03-15 NOTE — Telephone Encounter (Signed)
I had lmom earlier for Dr. Myrene Galas at [facility] in regards if he felt pt can take Lipitor. Dwayne, RN for Dr. Marcelline Deist called back and states chart notes for pt states pt intolerant to statins since 2004 also increased CK. I thanked Therapist, sports for his time and information. I will d/w PA for further recommendations. May need to send pt to our [facility] to review other options.

## 2015-03-15 NOTE — Addendum Note (Signed)
Addended by: Michae Kava on: 03/15/2015 05:30 PM   Modules accepted: Orders

## 2015-03-15 NOTE — Telephone Encounter (Signed)
Due to elevated CK in the past and intolerance to statins per Dr. Myrene Galas office we will refer pt to [facility]

## 2015-03-17 NOTE — Telephone Encounter (Signed)
Pt aware of referral to Lipid Clinic in our office. Pt is intolerant of statins along with elevated CK lab work as well, see scanned lab from 06/2014 from [place]. Pt agreeable to plan of care.

## 2015-03-23 ENCOUNTER — Ambulatory Visit (INDEPENDENT_AMBULATORY_CARE_PROVIDER_SITE_OTHER): Payer: Managed Care, Other (non HMO)

## 2015-03-23 ENCOUNTER — Ambulatory Visit: Payer: Managed Care, Other (non HMO) | Admitting: Pharmacist

## 2015-03-23 DIAGNOSIS — I82409 Acute embolism and thrombosis of unspecified deep veins of unspecified lower extremity: Secondary | ICD-10-CM | POA: Diagnosis not present

## 2015-03-23 DIAGNOSIS — I2699 Other pulmonary embolism without acute cor pulmonale: Secondary | ICD-10-CM | POA: Diagnosis not present

## 2015-03-23 DIAGNOSIS — I4891 Unspecified atrial fibrillation: Secondary | ICD-10-CM

## 2015-03-23 DIAGNOSIS — E785 Hyperlipidemia, unspecified: Secondary | ICD-10-CM

## 2015-03-23 DIAGNOSIS — I2119 ST elevation (STEMI) myocardial infarction involving other coronary artery of inferior wall: Secondary | ICD-10-CM

## 2015-03-23 DIAGNOSIS — Z5181 Encounter for therapeutic drug level monitoring: Secondary | ICD-10-CM | POA: Diagnosis not present

## 2015-03-23 DIAGNOSIS — Z7901 Long term (current) use of anticoagulants: Secondary | ICD-10-CM

## 2015-03-23 LAB — POCT INR: INR: 3

## 2015-03-23 NOTE — Progress Notes (Signed)
Patient ID:  Bobby Lindsey                DOB: Mar 04, 1955                         MRN: [id number]     HPI: Bobby Lindsey is a 60 y.o. male patient referred to lipid clinic by Bobby Lindsey. Pt has PMH of CAD s/p CABG 2002 and prior DES to RCA, recurrent pulmonary embolism/DVT, HTN, HL, DM, OSA. Patient was previously on atorvastatin and was stopped due to muscle pain and CK >600. Pt is seen at [facility] with a rheumatologist and he does not seem to think the elevated CK was due to the atorvastatin but instead due to uncontrolled diabetes. Of note, pt reports that he has not been on statin therapy for the past 2 years.  Patient states he has no energy and has had worsening neuropathy in his feet. He states he has not had his A1c checked in 2 years. He says he is followed by an endocrinologist in [place] but missed his last appointment. He also missed his last Coumadin appointment. He is currently taking Lantus and Humalog. He reports he stopped taking his Victoza because he didn't like the number of injections per day. Last A1c from 2015 was 10.7.  Current Medications: none currently Intolerances: atorvastatin (myalgia), crestor, simvastatin (fatigue) - has had elevated CK > 600 on Lipitor.  Risk Factors: CAD s/p CABG, DM LDL goal: <70 mg/dL  Diet: breakfast - sausage and eggs, lunch - chicken (grilled and fried), peas, baked potatoes, meatloaf; Dinner - tacos, pizza, sometimes vegetables; Snacks - craves sweets and eats them frequently.  Exercise: none currently, states he has no energy to do any exercise  Family History: The patient's family history includes Cancer in his father; Heart disease in his mother; Hypertension in his mother and sister. There is no history of Heart attack or Stroke  Social History: The patient denies drinking alcohol and has never smoked. He denies illicit drug use.  Labs: 06/2014: TC 124, LDL 59, HDL 32, TG 212 (pt reports he was not on drug) Most recent CK checked  with [place] was 233 in 2013, max was 1457 in 2012.  Past Medical History  Diagnosis Date  . HTN (hypertension)     x 15 years  . Dyslipidemia   . Diabetes mellitus     a. A1C 10.7 in 03/2013.  Marland Kitchen Peripheral neuropathy ([place])   . Charcot's joint of knee     UNSPECIFIED AREA  . Pulmonary embolism ([place])   . Deep venous thrombosis (HCC)     right lower extremity  . COPD (chronic obstructive pulmonary disease) ([place])   . Sleep apnea   . GERD (gastroesophageal reflux disease)   . Diastolic CHF ([place])     a. EF 40% by cath, 55-60% during 03/2013 adm, required IV diuresis.  . Atrial fibrillation ([place])     a. Transient during 03/2013 admission.  . DVT, lower extremity, recurrent ([place])     a. Hx recurrent DVT per record.  . Pulmonary emboli ([place])     a. Hx PE per record.  . Bradycardia     a. Bradycardia/pauses/possible Mobitz II during 03/2013 adm. Not on BB due to this.  . Elevated CK     a. Pt has refused rheum workup in the past.  . Obesity   . Hx of cardiovascular stress test     a.  Lexiscan Myoview (03/2010):  diaph atten vs inf scar, no ischemia, EF 47%; Low Risk.  Marland Kitchen Hx of echocardiogram     a. Echo (04/08/13):  Mild LVH, EF 55-60%, restrictive physiology, severe LAE, mild reduced RVSF, mild RAE.  Marland Kitchen CAD (coronary artery disease)     a. s/p CABG 2002. b. Hx Cypher stent to the RCA. c. Inf-lat STEMI 03/2013:  LHC (04/05/13):  mLAD occluded, pD1 90, apical br of Dx occluded, CFX occluded, pOM1 90-95, RCA stents patent, diff RCA 30, S-Dx occluded, S-PDA occluded, S-OM1 40-50, L-LAD patent, EF 40% with inf HK.  PCI:  Promus (2.5 x 28) DES to mid to dist CFX.  . Leg pain     ABI 6/16:  R 1.2, L 1.1 - normal    Current Outpatient Prescriptions on File Prior to Visit  Medication Sig Dispense Refill  . atorvastatin (LIPITOR) 40 MG tablet Take 1 tablet (40 mg total) by mouth every evening. (Patient not taking: Reported on 01/13/2015) 30 tablet 1  . furosemide (LASIX) 40 MG tablet Take 1 tablet (40 mg  total) by mouth daily. KEEP OV. 30 tablet 5  . gabapentin (NEURONTIN) 100 MG capsule Take 100 mg by mouth 2 (two) times daily.     . insulin glargine (LANTUS) 100 UNIT/ML injection Inject 40 Units into the skin 2 (two) times daily.     . insulin lispro (HUMALOG KWIKPEN) 100 UNIT/ML injection Inject 20 Units into the skin 3 (three) times daily before meals. UAD    . isosorbide mononitrate (IMDUR) 30 MG 24 hr tablet Take 1 tablet (30 mg total) by mouth daily. 90 tablet 3  . lidocaine (LIDODERM) 5 % Place 1 patch onto the skin daily. Remove & Discard patch within 12 hours or as directed by MD 30 patch 0  . losartan (COZAAR) 100 MG tablet Take 1 tablet (100 mg total) by mouth daily. 90 tablet 3  . metoprolol succinate (TOPROL-XL) 25 MG 24 hr tablet Take 0.5 tablets (12.5 mg total) by mouth daily. 90 tablet 0  . nitroGLYCERIN (NITROSTAT) 0.4 MG SL tablet Place 1 tablet (0.4 mg total) under the tongue every 5 (five) minutes as needed. 25 tablet 6  . oxyCODONE (OXY IR/ROXICODONE) 5 MG immediate release tablet Take 1 tablet (5 mg total) by mouth every 4 (four) hours as needed for severe pain. 30 tablet 0  . pantoprazole (PROTONIX) 40 MG tablet Take 1 tablet (40 mg total) by mouth daily. 30 tablet 6  . warfarin (COUMADIN) 10 MG tablet Take 10 mg by mouth daily.     No current facility-administered medications on file prior to visit.    Allergies  Allergen Reactions  . Simvastatin Other (See Comments)    fatigue    Assessment/Plan:  1. Hyperlipidemia - Patient is currently not on any medications for hyperlipidemia. Patient has not had lipid panel since May of 2016. Patient will follow up in [place] to get labwork checked this week - lipids, LFTs, CK. Pt provided with prescription for labwork and will have results faxed to [place]. Patient was taking atorvastatin when he developed muscle pain with CPK elevation of >600. Patients rheumatologist does not believe the elevated CPK was due to the atorvastatin  but instead due to diabetes. No medication changes today until we check a lipid panel. Discussed diet and exercise extensively to help with cholesterol and also diabetes control - lack of motivation seems to be a major player in patient's uncontrolled chronic disease states.  2. Diabetes -  patient has not had a diabetes follow up recently. He does not have a current A1c. Most recent A1c from 2015 was 10.7. Pt is not very adherent to his appointments and missed his most recent diabetes visit with his endocrinologist in [place] pt to restart his Victoza which he self-d/ced and have him continue current insulin regimen. Advised him to make follow up appt with endocrinologist. Also checking A1c with his lipid labs.   3. Anticoag - pt is seen in [place] Coumadin clinic and missed his most recent appointment. Added him on to our schedule today, see anticoag note from 2/7.    Megan E. Supple, PharmD, [place] [id number] [place], [place] [address] Phone: [phone]; Fax: [phone] 03/23/2015 3:44 PM

## 2015-03-31 ENCOUNTER — Other Ambulatory Visit: Payer: Self-pay | Admitting: Physician Assistant

## 2015-03-31 DIAGNOSIS — E785 Hyperlipidemia, unspecified: Secondary | ICD-10-CM

## 2015-03-31 NOTE — Progress Notes (Signed)
Patient came in to have blood drawn per Dr. Rosezella [place] order.  Blood was drawn from the left arm without any incident.

## 2015-04-01 LAB — CMP12+LP+TP+TSH+6AC+CBC/D/PLT
ALT: 31 IU/L (ref 0–44)
AST: 20 IU/L (ref 0–40)
Albumin/Globulin Ratio: 1.2 (ref 1.1–2.5)
Albumin: 3.8 g/dL (ref 3.6–4.8)
Alkaline Phosphatase: 69 IU/L (ref 39–117)
BUN/Creatinine Ratio: 16 (ref 10–22)
BUN: 16 mg/dL (ref 8–27)
Basophils Absolute: 0 10*3/uL (ref 0.0–0.2)
Basos: 0 %
Bilirubin Total: 0.4 mg/dL (ref 0.0–1.2)
Calcium: 9.3 mg/dL (ref 8.6–10.2)
Chloride: 96 mmol/L (ref 96–106)
Chol/HDL Ratio: 4.8 ratio units (ref 0.0–5.0)
Cholesterol, Total: 159 mg/dL (ref 100–199)
Creatinine, Ser: 1.02 mg/dL (ref 0.76–1.27)
EOS (ABSOLUTE): 0.2 10*3/uL (ref 0.0–0.4)
Eos: 2 %
Estimated CHD Risk: 1 times avg. (ref 0.0–1.0)
Free Thyroxine Index: 2.3 (ref 1.2–4.9)
GFR calc Af Amer: 92 mL/min/{1.73_m2} (ref >59)
GFR calc non Af Amer: 80 mL/min/{1.73_m2} (ref >59)
GGT: 109 IU/L — ABNORMAL HIGH (ref 0–65)
Globulin, Total: 3.1 g/dL (ref 1.5–4.5)
Glucose: 238 mg/dL — ABNORMAL HIGH (ref 65–99)
HDL: 33 mg/dL — ABNORMAL LOW (ref >39)
Hematocrit: 47.3 % (ref 37.5–51.0)
Hemoglobin: 15.6 g/dL (ref 12.6–17.7)
Immature Grans (Abs): 0.1 10*3/uL (ref 0.0–0.1)
Immature Granulocytes: 1 %
Iron: 71 ug/dL (ref 38–169)
LDH: 216 IU/L (ref 121–224)
LDL Calculated: 92 mg/dL (ref 0–99)
Lymphocytes Absolute: 2.1 10*3/uL (ref 0.7–3.1)
Lymphs: 25 %
MCH: 28.1 pg (ref 26.6–33.0)
MCHC: 33 g/dL (ref 31.5–35.7)
MCV: 85 fL (ref 79–97)
Monocytes Absolute: 0.6 10*3/uL (ref 0.1–0.9)
Monocytes: 8 %
Neutrophils Absolute: 5.4 10*3/uL (ref 1.4–7.0)
Neutrophils: 64 %
Phosphorus: 3.6 mg/dL (ref 2.5–4.5)
Platelets: 191 10*3/uL (ref 150–379)
Potassium: 4.6 mmol/L (ref 3.5–5.2)
RBC: 5.55 x10E6/uL (ref 4.14–5.80)
RDW: 14.7 % (ref 12.3–15.4)
Sodium: 136 mmol/L (ref 134–144)
T3 Uptake Ratio: 29 % (ref 24–39)
T4, Total: 7.8 ug/dL (ref 4.5–12.0)
TSH: 2.08 u[IU]/mL (ref 0.450–4.500)
Total Protein: 6.9 g/dL (ref 6.0–8.5)
Triglycerides: 168 mg/dL — ABNORMAL HIGH (ref 0–149)
Uric Acid: 7.2 mg/dL (ref 3.7–8.6)
VLDL Cholesterol Cal: 34 mg/dL (ref 5–40)
WBC: 8.3 10*3/uL (ref 3.4–10.8)

## 2015-04-01 LAB — HGB A1C W/O EAG: Hgb A1c MFr Bld: 10.4 % — ABNORMAL HIGH (ref 4.8–5.6)

## 2015-04-01 LAB — CREATININE KINASE MB: CK-MB Index: 7.2 ng/mL (ref 0.0–10.4)

## 2015-04-06 NOTE — Progress Notes (Signed)
Lab results were faxed to Dr. Percival Spanish at [facility] and a copy was mailed to patient per his request.

## 2015-04-09 ENCOUNTER — Encounter: Payer: Self-pay | Admitting: Physician Assistant

## 2015-04-12 ENCOUNTER — Telehealth: Payer: Self-pay | Admitting: Pharmacist

## 2015-04-12 MED ORDER — EZETIMIBE 10 MG PO TABS: 10.0000 mg | ORAL_TABLET | Freq: Every day | ORAL | Status: DC

## 2015-04-12 NOTE — Telephone Encounter (Signed)
Spoke with pt to inform him of lab results. Will start pt on Zetia 10mg  daily for LDL of 92 with goal <70. A1c is 10.4, pt reports that he has not been taking his Victoza consistently. Advised him to take it daily and to schedule an appt with his endocrinologist at [place]. Mailed a prescription to pt so that he can have labwork checked in [place] in 3 months - lipid panel, LFTs, CK. Pt verbalized understanding.

## 2015-04-14 ENCOUNTER — Ambulatory Visit (INDEPENDENT_AMBULATORY_CARE_PROVIDER_SITE_OTHER): Payer: Managed Care, Other (non HMO)

## 2015-04-14 DIAGNOSIS — I2699 Other pulmonary embolism without acute cor pulmonale: Secondary | ICD-10-CM | POA: Diagnosis not present

## 2015-04-14 DIAGNOSIS — I82409 Acute embolism and thrombosis of unspecified deep veins of unspecified lower extremity: Secondary | ICD-10-CM

## 2015-04-14 DIAGNOSIS — Z5181 Encounter for therapeutic drug level monitoring: Secondary | ICD-10-CM

## 2015-04-14 DIAGNOSIS — Z7901 Long term (current) use of anticoagulants: Secondary | ICD-10-CM | POA: Diagnosis not present

## 2015-04-14 DIAGNOSIS — I4891 Unspecified atrial fibrillation: Secondary | ICD-10-CM

## 2015-04-14 DIAGNOSIS — I2119 ST elevation (STEMI) myocardial infarction involving other coronary artery of inferior wall: Secondary | ICD-10-CM

## 2015-04-14 LAB — POCT INR: INR: 3

## 2015-05-10 ENCOUNTER — Other Ambulatory Visit: Payer: Self-pay | Admitting: Cardiovascular Disease

## 2015-05-10 NOTE — Telephone Encounter (Signed)
Rx refill sent to pharmacy. 

## 2015-05-12 ENCOUNTER — Ambulatory Visit (INDEPENDENT_AMBULATORY_CARE_PROVIDER_SITE_OTHER): Payer: Managed Care, Other (non HMO)

## 2015-05-12 DIAGNOSIS — I82409 Acute embolism and thrombosis of unspecified deep veins of unspecified lower extremity: Secondary | ICD-10-CM | POA: Diagnosis not present

## 2015-05-12 DIAGNOSIS — I2699 Other pulmonary embolism without acute cor pulmonale: Secondary | ICD-10-CM | POA: Diagnosis not present

## 2015-05-12 DIAGNOSIS — Z7901 Long term (current) use of anticoagulants: Secondary | ICD-10-CM

## 2015-05-12 DIAGNOSIS — Z5181 Encounter for therapeutic drug level monitoring: Secondary | ICD-10-CM

## 2015-05-12 DIAGNOSIS — I4891 Unspecified atrial fibrillation: Secondary | ICD-10-CM

## 2015-05-12 DIAGNOSIS — I2119 ST elevation (STEMI) myocardial infarction involving other coronary artery of inferior wall: Secondary | ICD-10-CM

## 2015-05-12 LAB — POCT INR: INR: 3.2

## 2015-05-13 ENCOUNTER — Ambulatory Visit: Payer: Managed Care, Other (non HMO) | Admitting: Cardiology

## 2015-05-26 ENCOUNTER — Other Ambulatory Visit: Payer: Self-pay | Admitting: Cardiology

## 2015-06-17 ENCOUNTER — Encounter: Payer: Self-pay | Admitting: Cardiology

## 2015-06-17 ENCOUNTER — Ambulatory Visit (INDEPENDENT_AMBULATORY_CARE_PROVIDER_SITE_OTHER): Payer: Managed Care, Other (non HMO)

## 2015-06-17 ENCOUNTER — Ambulatory Visit (INDEPENDENT_AMBULATORY_CARE_PROVIDER_SITE_OTHER): Payer: Managed Care, Other (non HMO) | Admitting: Cardiology

## 2015-06-17 VITALS — BP 118/76 | HR 64 | Ht 72.0 in | Wt 273.0 lb

## 2015-06-17 DIAGNOSIS — I10 Essential (primary) hypertension: Secondary | ICD-10-CM

## 2015-06-17 DIAGNOSIS — Z7901 Long term (current) use of anticoagulants: Secondary | ICD-10-CM

## 2015-06-17 DIAGNOSIS — Z5181 Encounter for therapeutic drug level monitoring: Secondary | ICD-10-CM | POA: Diagnosis not present

## 2015-06-17 DIAGNOSIS — G4733 Obstructive sleep apnea (adult) (pediatric): Secondary | ICD-10-CM

## 2015-06-17 DIAGNOSIS — I82409 Acute embolism and thrombosis of unspecified deep veins of unspecified lower extremity: Secondary | ICD-10-CM | POA: Diagnosis not present

## 2015-06-17 DIAGNOSIS — I2119 ST elevation (STEMI) myocardial infarction involving other coronary artery of inferior wall: Secondary | ICD-10-CM

## 2015-06-17 DIAGNOSIS — E669 Obesity, unspecified: Secondary | ICD-10-CM | POA: Diagnosis not present

## 2015-06-17 DIAGNOSIS — I4891 Unspecified atrial fibrillation: Secondary | ICD-10-CM

## 2015-06-17 DIAGNOSIS — I2699 Other pulmonary embolism without acute cor pulmonale: Secondary | ICD-10-CM | POA: Diagnosis not present

## 2015-06-17 LAB — POCT INR: INR: 2.8

## 2015-06-17 NOTE — Progress Notes (Addendum)
Cardiology Office Note    Date:  06/17/2015   ID:  Bobby Lindsey, DOB [birth year], MRN [id number]  PCP:  Bobby Hector, MD  Cardiologist:  Bobby Margarita, MD   Chief Complaint  Patient presents with  . New Evaluation  . Sleep Apnea  . Hypertension    History of Present Illness:  Bobby Lindsey is a 60 y.o. male with a history of ASCAD, HTN, DM and remote history of OSA who is referred today for further evaluation of his OSA.  He has had a diagnosis of OSA that was made about 4-5 years ago but was not able to tolerate CPAP.  He then had another sleep study done at [facility] and CPAP was recommended but he did not proceed with it.  He says that he does not like any type of mask on his face.  He does not know if he has tried the nasal pillow mask.  He has problems with sleepiness during the day and naps when he sits down and sometimes has to pull off of the road to nap.  He wakes up feeling sleepy as well.  He says that his wife says he snores.  Occasionally he will wake up SOB.      Past Medical History  Diagnosis Date  . HTN (hypertension)     x 15 years  . Dyslipidemia   . Diabetes mellitus     a. A1C 10.7 in 03/2013.  Marland Kitchen Peripheral neuropathy ([place])   . Charcot's joint of knee     UNSPECIFIED AREA  . Pulmonary embolism ([place])   . Deep venous thrombosis (HCC)     right lower extremity  . COPD (chronic obstructive pulmonary disease) ([place])   . Sleep apnea   . GERD (gastroesophageal reflux disease)   . Diastolic CHF ([place])     a. EF 40% by cath, 55-60% during 03/2013 adm, required IV diuresis.  . Atrial fibrillation ([place])     a. Transient during 03/2013 admission.  . DVT, lower extremity, recurrent ([place])     a. Hx recurrent DVT per record.  . Pulmonary emboli ([place])     a. Hx PE per record.  . Bradycardia     a. Bradycardia/pauses/possible Mobitz II during 03/2013 adm. Not on BB due to this.  . Elevated CK     a. Pt has refused rheum workup in the past.  . Obesity   . Hx of  cardiovascular stress test     a. Lexiscan Myoview (03/2010):  diaph atten vs inf scar, no ischemia, EF 47%; Low Risk.  Marland Kitchen Hx of echocardiogram     a. Echo (04/08/13):  Mild LVH, EF 55-60%, restrictive physiology, severe LAE, mild reduced RVSF, mild RAE.  Marland Kitchen CAD (coronary artery disease)     a. s/p CABG 2002. b. Hx Cypher stent to the RCA. c. Inf-lat STEMI 03/2013:  LHC (04/05/13):  mLAD occluded, pD1 90, apical br of Dx occluded, CFX occluded, pOM1 90-95, RCA stents patent, diff RCA 30, S-Dx occluded, S-PDA occluded, S-OM1 40-50, L-LAD patent, EF 40% with inf HK.  PCI:  Promus (2.5 x 28) DES to mid to dist CFX.  . Leg pain     ABI 6/16:  R 1.2, L 1.1 - normal    Past Surgical History  Procedure Laterality Date  . Left knee surgery    . Coronary artery bypass graft      4 time since 2002  . Coronary angioplasty with stent  placement    . Percutaneous coronary stent intervention (pci-s)  04/05/2013    Procedure: PERCUTANEOUS CORONARY STENT INTERVENTION (PCI-S);  Surgeon: Peter M Martinique, MD;  Location: [facility] C[facility] LAB;  Service: Cardiovascular;;  DES to native Mid cx  . Left heart catheterization with coronary/graft angiogram  04/05/2013    Procedure: LEFT HEART CATHETERIZATION WITH Bobby Lindsey;  Surgeon: Peter M Martinique, MD;  Location[facility] LAB;  Service: Cardiovascular;;    Current Medications: Outpatient Prescriptions Prior to Visit  Medication Sig Dispense Refill  . ezetimibe (ZETIA) 10 MG tablet Take 1 tablet (10 mg total) by mouth daily. 30 tablet 11  . furosemide (LASIX) 40 MG tablet TAKE ONE (1) TABLET BY MOUTH EVERY DAY 30 tablet 2  . insulin glargine (LANTUS) 100 UNIT/ML injection Inject 40 Units into the skin 2 (two) times daily.     . insulin lispro (HUMALOG KWIKPEN) 100 UNIT/ML injection Inject 20 Units into the skin 3 (three) times daily before meals. UAD    . isosorbide mononitrate (IMDUR) 30 MG 24 hr tablet Take 1 tablet (30 mg total) by mouth daily. 90 tablet 3  .  lidocaine (LIDODERM) 5 % Place 1 patch onto the skin daily. Remove & Discard patch within 12 hours or as directed by MD 30 patch 0  . losartan (COZAAR) 100 MG tablet Take 1 tablet (100 mg total) by mouth daily. 90 tablet 3  . metoprolol succinate (TOPROL-XL) 25 MG 24 hr tablet Take 0.5 tablets (12.5 mg total) by mouth daily. 90 tablet 0  . pantoprazole (PROTONIX) 40 MG tablet Take 1 tablet (40 mg total) by mouth daily. 30 tablet 6  . warfarin (COUMADIN) 5 MG tablet TAKE 2 TO 3 TABLETS BY MOUTH DAILY AS DIRECTED BY COUMADIN CLINIC 70 tablet 3  . gabapentin (NEURONTIN) 100 MG capsule Take 100 mg by mouth at bedtime.     . nitroGLYCERIN (NITROSTAT) 0.4 MG SL tablet Place 1 tablet (0.4 mg total) under the tongue every 5 (five) minutes as needed. 25 tablet 6   No facility-administered medications prior to visit.     Allergies:   Simvastatin   Social History   Social History  . Marital Status: Married    Spouse Name: N/A  . Number of Children: N/A  . Years of Education: N/A   Occupational History  . CONSTRUCTION     General contractor   Social History Main Topics  . Smoking status: Never Smoker   . Smokeless tobacco: Never Used  . Alcohol Use: Yes     Comment: rare  . Drug Use: No  . Sexual Activity: Yes   Other Topics Concern  . 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      Family History:  The patient's family history includes Cancer in his father; Heart disease in his mother; Hypertension in his mother and sister. There is no history of Heart attack or Stroke.   ROS:   Please see the history of present illness.    ROS All other systems reviewed and are negative.   PHYSICAL EXAM:   VS:  BP 118/76 mmHg  Pulse 64  Ht 6' (1.829 m)  Wt 273 lb (123.832 kg)  BMI 37.02 kg/m2   GEN: Well nourished, well developed, in no acute distress HEENT: normal Neck: no JVD, carotid bruits, or masses Cardiac: RRR; no murmurs, rubs, or gallops,no  edema.  Intact distal pulses bilaterally.  Respiratory:  clear to auscultation bilaterally,  normal work of breathing GI: soft, nontender, nondistended, + BS MS: no deformity or atrophy Skin: warm and dry, no rash Neuro:  Alert and Oriented x 3, Strength and sensation are intact Psych: euthymic mood, full affect  Wt Readings from Last 3 Encounters:  06/17/15 273 lb (123.832 kg)  03/08/15 272 lb (123.378 kg)  03/04/15 269 lb (122.018 kg)      Studies/Labs Reviewed:   EKG:  EKG is not ordered today.    Recent Labs: 01/13/2015: Hemoglobin 14.3 03/31/2015: ALT 31; BUN 16; Creatinine, Ser 1.02; Platelets 191; Potassium 4.6; Sodium 136; TSH 2.080   Lipid Panel    Component Value Date/Time   CHOL 159 03/31/2015 1100   CHOL 109 03/11/2008 0930   TRIG 168* 03/31/2015 1100   HDL 33* 03/31/2015 1100   HDL 30.1* 03/11/2008 0930   CHOLHDL 4.8 03/31/2015 1100   CHOLHDL 3.6 CALC 03/11/2008 0930   VLDL 21 03/11/2008 0930   LDLCALC 92 03/31/2015 1100   LDLCALC 58 03/11/2008 0930    Additional studies/ records that were reviewed today include:  none    ASSESSMENT:    1. OSA (obstructive sleep apnea)   2. Essential hypertension   3. Obesity      PLAN:  In order of problems listed above:  1. OSA - he had a sleep study 9 months ago but I do not have the results.  He has not tolerated the full face mask in the past but does not think that he has tried a nasal or pillow mask.  I will get a copy of his sleep study from [facility] and then decide about further CPAP titration.   He has significant daytime sleepiness and was falling asleep in the office so I suspect he is has severe OSA.   2. HTN - BP controlled on current medical regimen.  Continue on Losartan and BB. 3. Obesity - I have encouraged him to get into a routine exercise program and cut back on carbs and portions.   I will see him back after CPAP titration if we proceed with titration based on PSG report.  Medication  Adjustments/Labs and Tests Ordered: Current medicines are reviewed at length with the patient today.  Concerns regarding medicines are outlined above.  Medication changes, Labs and Tests ordered today are listed in the Patient Instructions below.  Patient Instructions  Medication Instructions:  Your physician recommends that you continue on your current medications as directed. Please refer to the Current Medication list given to you today.   Labwork: None  Testing/Procedures: None  Follow-Up: Your physician recommends that you schedule a follow-up appointment AS NEEDED with Dr. Radford Pax pending her review of your sleep study.  Any Other Special Instructions Will Be Listed Below (If Applicable).     If you need a refill on your cardiac medications before your next appointment, please call your pharmacy.       Lurena Nida, MD  06/17/2015 3:12 PM    [facility] [place], [place], [place]  [address] Phone: [phone]; Fax: [phone]

## 2015-06-17 NOTE — Patient Instructions (Signed)
Medication Instructions:  Your physician recommends that you continue on your current medications as directed. Please refer to the Current Medication list given to you today.   Labwork: None  Testing/Procedures: None  Follow-Up: Your physician recommends that you schedule a follow-up appointment AS NEEDED with Dr. Radford Pax pending her review of your sleep study.  Any Other Special Instructions Will Be Listed Below (If Applicable).     If you need a refill on your cardiac medications before your next appointment, please call your pharmacy.

## 2015-06-22 ENCOUNTER — Other Ambulatory Visit: Payer: Self-pay | Admitting: Cardiology

## 2015-06-22 NOTE — Telephone Encounter (Signed)
Please see routing comment and advise on request. Thanks, MI

## 2015-07-06 ENCOUNTER — Encounter: Payer: Self-pay | Admitting: Physician Assistant

## 2015-07-06 ENCOUNTER — Ambulatory Visit (INDEPENDENT_AMBULATORY_CARE_PROVIDER_SITE_OTHER): Payer: Managed Care, Other (non HMO) | Admitting: Physician Assistant

## 2015-07-06 ENCOUNTER — Ambulatory Visit (INDEPENDENT_AMBULATORY_CARE_PROVIDER_SITE_OTHER): Payer: Managed Care, Other (non HMO) | Admitting: *Deleted

## 2015-07-06 VITALS — BP 130/70 | HR 64 | Ht 72.0 in | Wt 273.2 lb

## 2015-07-06 DIAGNOSIS — I4891 Unspecified atrial fibrillation: Secondary | ICD-10-CM

## 2015-07-06 DIAGNOSIS — I2510 Atherosclerotic heart disease of native coronary artery without angina pectoris: Secondary | ICD-10-CM | POA: Diagnosis not present

## 2015-07-06 DIAGNOSIS — I82409 Acute embolism and thrombosis of unspecified deep veins of unspecified lower extremity: Secondary | ICD-10-CM

## 2015-07-06 DIAGNOSIS — E1159 Type 2 diabetes mellitus with other circulatory complications: Secondary | ICD-10-CM

## 2015-07-06 DIAGNOSIS — I10 Essential (primary) hypertension: Secondary | ICD-10-CM

## 2015-07-06 DIAGNOSIS — I2699 Other pulmonary embolism without acute cor pulmonale: Secondary | ICD-10-CM

## 2015-07-06 DIAGNOSIS — Z5181 Encounter for therapeutic drug level monitoring: Secondary | ICD-10-CM

## 2015-07-06 DIAGNOSIS — I480 Paroxysmal atrial fibrillation: Secondary | ICD-10-CM | POA: Diagnosis not present

## 2015-07-06 DIAGNOSIS — I5032 Chronic diastolic (congestive) heart failure: Secondary | ICD-10-CM

## 2015-07-06 DIAGNOSIS — I2119 ST elevation (STEMI) myocardial infarction involving other coronary artery of inferior wall: Secondary | ICD-10-CM

## 2015-07-06 DIAGNOSIS — E785 Hyperlipidemia, unspecified: Secondary | ICD-10-CM

## 2015-07-06 DIAGNOSIS — G4733 Obstructive sleep apnea (adult) (pediatric): Secondary | ICD-10-CM

## 2015-07-06 DIAGNOSIS — Z794 Long term (current) use of insulin: Secondary | ICD-10-CM

## 2015-07-06 DIAGNOSIS — Z7901 Long term (current) use of anticoagulants: Secondary | ICD-10-CM | POA: Diagnosis not present

## 2015-07-06 LAB — POCT INR: INR: 2.7

## 2015-07-06 MED ORDER — PANTOPRAZOLE SODIUM 40 MG PO TBEC: 40.0000 mg | DELAYED_RELEASE_TABLET | ORAL | Status: DC

## 2015-07-06 NOTE — Progress Notes (Signed)
Cardiology Office Note:    Date:  07/06/2015   ID:  Bobby Lindsey, DOB 03/17/1955, MRN [id number]  PCP:  Adin Hector, MD  Cardiologist:  Dr. Minus Breeding   Electrophysiologist:  N/a Sleep Med: Dr. Fransico Him  Rheumatologist: Dr. Percell [facility])  Referring MD: Adin Hector, MD   Chief Complaint  Patient presents with  . Coronary Artery Disease    follow up    History of Present Illness:     Bobby Lindsey is a 60 y.o. male with a hx of CAD s/p CABG 2002 and prior DES to RCA, recurrent pulmonary embolism/DVT, HTN, HL, DM, OSA. He was admitted to [facility] 03/2013 with an Inferior STEMI. Emergent LHC demonstrated patent S-OM1 and L-LAD, occluded S-Dx and S-PDA. EF was 40% with inf wall motion abnormality. Echo demonstrated normal LVF prior to d/c. Culprit for STEMI was an occluded native CFX which was treated with DES. He has a hx of elevated CK and has remained off of statin Rx.  Hospitalization after MI in 2015 was c/b AFib and a/c diastolic CHF. He was started on Amiodarone. He developed Mobitz II and beta blocker was d/c'd. He was d/c in NSR. He stopped the Amiodarone on his own as he thought it was causing dyspnea.   Echo in 12/15 demonstrated normal LVF and mild pulmonary HTN.   When last seen by Dr. Minus Breeding in 9/16, Plavix was DC'd.   Last seen in clinic by me in 1/17.   Returns for FU.  Here alone.  Overall stable. DOE is unchanged.  He is NYHA 2-2b.  Denies orthopnea, PND.  LE edema unchanged.  R leg is always larger than the L. Denies syncope.  Denies chest pain. Does get dizzy at times.  Still has a lot of myalgias.  He decided to not start the Zetia.  Sugars at home 150-300.  He sees endocrine at [facility].     Past Medical History  Diagnosis Date  . HTN (hypertension)     x 15 years  . Dyslipidemia   . Diabetes mellitus     a. A1C 10.7 in 03/2013.  Marland Kitchen Peripheral neuropathy ([place])   . Charcot's joint of knee     UNSPECIFIED AREA   . Pulmonary embolism ([place])   . Deep venous thrombosis (HCC)     right lower extremity  . COPD (chronic obstructive pulmonary disease) ([place])   . Sleep apnea   . GERD (gastroesophageal reflux disease)   . Diastolic CHF ([place])     a. EF 40% by cath, 55-60% during 03/2013 adm, required IV diuresis.  . Atrial fibrillation ([place])     a. Transient during 03/2013 admission.  . DVT, lower extremity, recurrent ([place])     a. Hx recurrent DVT per record.  . Pulmonary emboli ([place])     a. Hx PE per record.  . Bradycardia     a. Bradycardia/pauses/possible Mobitz II during 03/2013 adm. Not on BB due to this.  . Elevated CK     a. Pt has refused rheum workup in the past.  . Obesity   . Hx of cardiovascular stress test     a. Lexiscan Myoview (03/2010):  diaph atten vs inf scar, no ischemia, EF 47%; Low Risk.  Marland Kitchen Hx of echocardiogram     a. Echo (04/08/13):  Mild LVH, EF 55-60%, restrictive physiology, severe LAE, mild reduced RVSF, mild RAE.  Marland Kitchen CAD (coronary artery disease)  a. s/p CABG 2002. b. Hx Cypher stent to the RCA. c. Inf-lat STEMI 03/2013:  LHC (04/05/13):  mLAD occluded, pD1 90, apical br of Dx occluded, CFX occluded, pOM1 90-95, RCA stents patent, diff RCA 30, S-Dx occluded, S-PDA occluded, S-OM1 40-50, L-LAD patent, EF 40% with inf HK.  PCI:  Promus (2.5 x 28) DES to mid to dist CFX.  . Leg pain     ABI 6/16:  R 1.2, L 1.1 - normal    Past Surgical History  Procedure Laterality Date  . Left knee surgery    . Coronary artery bypass graft      4 time since 2002  . Coronary angioplasty with stent placement    . Percutaneous coronary stent intervention (pci-s)  04/05/2013    Procedure: PERCUTANEOUS CORONARY STENT INTERVENTION (PCI-S);  Surgeon: Peter M Martinique, MD;  Location: [facility] CATH LAB;  Service: Cardiovascular;;  DES to native Mid cx  . Left heart catheterization with coronary/graft angiogram  04/05/2013    Procedure: LEFT HEART CATHETERIZATION WITH Beatrix Fetters;  Surgeon: Peter M  Martinique, MD;  Location: [facility] CATH LAB;  Service: Cardiovascular;;    Current Medications: Outpatient Prescriptions Prior to Visit  Medication Sig Dispense Refill  . ezetimibe (ZETIA) 10 MG tablet Take 1 tablet (10 mg total) by mouth daily. 30 tablet 11  . furosemide (LASIX) 40 MG tablet TAKE ONE (1) TABLET BY MOUTH EVERY DAY 30 tablet 2  . insulin glargine (LANTUS) 100 UNIT/ML injection Inject 40 Units into the skin 2 (two) times daily.     . insulin lispro (HUMALOG KWIKPEN) 100 UNIT/ML injection Inject 20 Units into the skin 3 (three) times daily before meals. UAD    . isosorbide mononitrate (IMDUR) 30 MG 24 hr tablet Take 1 tablet (30 mg total) by mouth daily. 90 tablet 3  . lidocaine (LIDODERM) 5 % Place 1 patch onto the skin daily. Remove & Discard patch within 12 hours or as directed by MD 30 patch 0  . losartan (COZAAR) 100 MG tablet Take 1 tablet (100 mg total) by mouth daily. 90 tablet 3  . metoprolol succinate (TOPROL-XL) 25 MG 24 hr tablet Take 0.5 tablets (12.5 mg total) by mouth daily. 90 tablet 0  . nitroGLYCERIN (NITROSTAT) 0.4 MG SL tablet Place 0.4 mg under the tongue every 5 (five) minutes as needed for chest pain.    Marland Kitchen warfarin (COUMADIN) 5 MG tablet TAKE 2 TO 3 TABLETS BY MOUTH DAILY AS DIRECTED BY COUMADIN CLINIC 70 tablet 3  . pantoprazole (PROTONIX) 40 MG tablet TAKE ONE (1) TABLET BY MOUTH EVERY DAY 30 tablet 6  . gabapentin (NEURONTIN) 100 MG capsule Take 100 mg by mouth at bedtime.      No facility-administered medications prior to visit.      Allergies:   Simvastatin   Social History   Social History  . Marital Status: Married    Spouse Name: N/A  . Number of Children: N/A  . Years of Education: N/A   Occupational History  . CONSTRUCTION     General contractor   Social History Main Topics  . Smoking status: Never Smoker   . Smokeless tobacco: Never Used  . Alcohol Use: Yes     Comment: rare  . Drug Use: No  . Sexual Activity: Yes   Other Topics  Concern  . 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      Family History:  The patient's family  history includes Cancer in his father; Heart disease in his mother; Hypertension in his mother and sister. There is no history of Heart attack or Stroke.   ROS:   Please see the history of present illness.    Review of Systems  Constitution: Positive for malaise/fatigue.  HENT: Positive for hearing loss.   Cardiovascular: Positive for dyspnea on exertion.  Respiratory: Positive for snoring.   Skin: Positive for rash.  Musculoskeletal: Positive for back pain and myalgias.  Neurological: Positive for dizziness and loss of balance.   All other systems reviewed and are negative.   Physical Exam:    VS:  BP 130/70 mmHg  Pulse 64  Ht 6' (1.829 m)  Wt 273 lb 3.2 oz (123.923 kg)  BMI 37.04 kg/m2  SpO2 96%   GEN: Well nourished, well developed, in no acute distress HEENT: normal Neck: no JVD, no masses Cardiac: Normal S1/S2, RRR; no murmurs, rubs, or gallops, 1+ bilateral LE edema L > R     Respiratory:  clear to auscultation bilaterally; no wheezing, rhonchi or rales GI: soft, nontender, nondistended MS: no deformity or atrophy Skin: warm and dry Neuro: No focal deficits  Psych: Alert and oriented x 3, normal affect  Wt Readings from Last 3 Encounters:  07/06/15 273 lb 3.2 oz (123.923 kg)  06/17/15 273 lb (123.832 kg)  03/08/15 272 lb (123.378 kg)      Studies/Labs Reviewed:     EKG:  EKG is  ordered today.  The ekg ordered today demonstrates NSR, HR 64, inf Q waves, TWI aVL, Tc 447 ms, no changes  Recent Labs: 01/13/2015: Hemoglobin 14.3 03/31/2015: ALT 31; BUN 16; Creatinine, Ser 1.02; Platelets 191; Potassium 4.6; Sodium 136; TSH 2.080   Recent Lipid Panel    Component Value Date/Time   CHOL 159 03/31/2015 1100   CHOL 109 03/11/2008 0930   TRIG 168* 03/31/2015 1100   HDL 33* 03/31/2015 1100   HDL 30.1* 03/11/2008 0930   CHOLHDL  4.8 03/31/2015 1100   CHOLHDL 3.6 CALC 03/11/2008 0930   VLDL 21 03/11/2008 0930   LDLCALC 92 03/31/2015 1100   LDLCALC 58 03/11/2008 0930    Additional studies/ records that were reviewed today include:   Echocardiogram 01/29/14 Mod LVH, EF 55-60%, Aortic sclerosis, trivial AI, MAC, mild LAE, PASP 36 mmHg  ASSESSMENT:     1. Coronary artery disease involving native coronary artery of native heart without angina pectoris   2. PAF (paroxysmal atrial fibrillation) ([place])   3. Chronic diastolic CHF (congestive heart failure) ([place])   4. Essential hypertension   5. Hyperlipidemia   6. Type 2 diabetes mellitus with other circulatory complication, with long-term current use of insulin ([place])   7. OSA (obstructive sleep apnea)     PLAN:     In order of problems listed above:  1.CAD - s/p CABG. He is s/p DES to the CFX in 03/2013. LHC in 03/2013 demonstrated an occluded S-Dx with 90% stenosis in the D1 and an occluded apical branch of the Dx. He is now off of Plavix and remains on Coumadin. No angina.  Continue nitrates, Toprol-XL, Losartan. He has been kept off of statin Rx due to CK elevation in the past.    2.Paroxysmal Atrial Fibrillation: Maintaining NSR. He needs to remain on Coumadin long-term due to prior hx of PE.    3.Chronic Diastolic CHF:  Volume stable.  Continue current Rx.He can take extra Lasix if weight or edema increases.   4. HTN: Controlled.  5. Hyperlipidemia: LDL in 2/17 was 92.  Seen by Lipid clinic and started on Zetia.   He decided to not take this.  I have asked him to start Zetia.  If he cannot tolerate it, he will call.    6.Diabetes - Uncontrolled.  I have encouraged him to FU with endocrine for strict control of diabetes to reduce risk of future CV events.   7. OSA - He cannot tolerate CPAP. He is now followed by Dr. Fransico Him.    Medication Adjustments/Labs and Tests Ordered: Current medicines are reviewed at length with the patient  today.  Concerns regarding medicines are outlined above.  Medication changes, Labs and Tests ordered today are outlined in the Patient Instructions noted below. Patient Instructions  Medication Instructions:  See your medication list. Decrease Protonix (Pantoprazole) to every other day for a total of 3 doses, then stop.  You can take it once daily as needed for indigestion (or Zantac). If you feel like your legs are more swollen or if you see your weight go up 3 lbs in 1 day, you can take an Extra Lasix 40 mg that day. Try to start the Zetia.  If you are able to tolerate it, call us in about 3 months so that we can arrange follow up labs. Labwork: None today. Testing/Procedures: None today.  Follow-Up Dr. Minus Breeding in 6 months. Dr. Fransico Him (sleep apnea) as planned.  Any Other Special Instructions Will Be Listed Below (If Applicable). Continue close follow up with your diabetes doctor to keep you sugars down.  If you need a refill on your cardiac medications before your next appointment, please call your pharmacy.    Signed, Aeson Sgro, PA-C  07/06/2015 2:29 PM    [facility] [place], [place], [place]  [address] Phone: [phone]; Fax: [phone]

## 2015-07-06 NOTE — Patient Instructions (Addendum)
Medication Instructions:  See your medication list. Decrease Protonix (Pantoprazole) to every other day for a total of 3 doses, then stop.  You can take it once daily as needed for indigestion (or Zantac). If you feel like your legs are more swollen or if you see your weight go up 3 lbs in 1 day, you can take an Extra Lasix 40 mg that day. Try to start the Zetia.  If you are able to tolerate it, call us in about 3 months so that we can arrange follow up labs. Labwork: None today. Testing/Procedures: None today.  Follow-Up Dr. Minus Breeding in 6 months. Dr. Fransico Him (sleep apnea) as planned.  Any Other Special Instructions Will Be Listed Below (If Applicable). Continue close follow up with your diabetes doctor to keep you sugars down.  If you need a refill on your cardiac medications before your next appointment, please call your pharmacy.

## 2015-07-14 ENCOUNTER — Other Ambulatory Visit: Payer: Self-pay | Admitting: *Deleted

## 2015-07-14 MED ORDER — FUROSEMIDE 40 MG PO TABS: 40.0000 mg | ORAL_TABLET | Freq: Every day | ORAL | Status: DC

## 2015-07-15 ENCOUNTER — Encounter: Payer: Self-pay | Admitting: Cardiology

## 2015-09-08 ENCOUNTER — Ambulatory Visit (INDEPENDENT_AMBULATORY_CARE_PROVIDER_SITE_OTHER): Payer: Managed Care, Other (non HMO)

## 2015-09-08 DIAGNOSIS — I2699 Other pulmonary embolism without acute cor pulmonale: Secondary | ICD-10-CM

## 2015-09-08 DIAGNOSIS — I82409 Acute embolism and thrombosis of unspecified deep veins of unspecified lower extremity: Secondary | ICD-10-CM

## 2015-09-08 DIAGNOSIS — Z5181 Encounter for therapeutic drug level monitoring: Secondary | ICD-10-CM

## 2015-09-08 DIAGNOSIS — Z7901 Long term (current) use of anticoagulants: Secondary | ICD-10-CM | POA: Diagnosis not present

## 2015-09-08 DIAGNOSIS — I4891 Unspecified atrial fibrillation: Secondary | ICD-10-CM

## 2015-09-08 DIAGNOSIS — I2119 ST elevation (STEMI) myocardial infarction involving other coronary artery of inferior wall: Secondary | ICD-10-CM

## 2015-09-08 LAB — POCT INR: INR: 1.7

## 2015-09-10 ENCOUNTER — Ambulatory Visit: Payer: Self-pay | Admitting: Physician Assistant

## 2015-09-10 VITALS — BP 130/70 | HR 64 | Temp 98.1°F

## 2015-09-10 DIAGNOSIS — B372 Candidiasis of skin and nail: Secondary | ICD-10-CM

## 2015-09-10 MED ORDER — CLOTRIMAZOLE-BETAMETHASONE 1-0.05 % EX CREA: 1.0000 "application " | TOPICAL_CREAM | Freq: Two times a day (BID) | CUTANEOUS | 2 refills | Status: DC

## 2015-09-10 NOTE — Progress Notes (Signed)
S: hx of dm, states has rash under his stomach and in pubic area now heading towards legs, no fever/chills, no drainage from area, has been using more insulin than he use to in the past  O: vitals wnl, nad, skin with lacy bordered red rash on pubic area extending to top of thighs, no pus or drainage, no odor, n/v intact  A: candidiasis  P: lotrisone, otc cornstarch powder, if not betterin 1 week will refer to dermatology

## 2015-09-13 ENCOUNTER — Telehealth: Payer: Self-pay | Admitting: *Deleted

## 2015-09-13 ENCOUNTER — Other Ambulatory Visit: Payer: Self-pay

## 2015-09-13 DIAGNOSIS — I5032 Chronic diastolic (congestive) heart failure: Secondary | ICD-10-CM

## 2015-09-13 MED ORDER — METOPROLOL SUCCINATE ER 25 MG PO TB24: 12.5000 mg | ORAL_TABLET | Freq: Every day | ORAL | 2 refills | Status: DC

## 2015-09-13 NOTE — Telephone Encounter (Signed)
Received call from [facility] in [place] requesting rx refill for Metoprolol succinate 25mg  24hr tablet.  Rx sent.

## 2015-09-22 ENCOUNTER — Ambulatory Visit (INDEPENDENT_AMBULATORY_CARE_PROVIDER_SITE_OTHER): Payer: Managed Care, Other (non HMO)

## 2015-09-22 DIAGNOSIS — I82409 Acute embolism and thrombosis of unspecified deep veins of unspecified lower extremity: Secondary | ICD-10-CM

## 2015-09-22 DIAGNOSIS — Z5181 Encounter for therapeutic drug level monitoring: Secondary | ICD-10-CM

## 2015-09-22 DIAGNOSIS — I2119 ST elevation (STEMI) myocardial infarction involving other coronary artery of inferior wall: Secondary | ICD-10-CM

## 2015-09-22 DIAGNOSIS — Z7901 Long term (current) use of anticoagulants: Secondary | ICD-10-CM

## 2015-09-22 DIAGNOSIS — I2699 Other pulmonary embolism without acute cor pulmonale: Secondary | ICD-10-CM

## 2015-09-22 DIAGNOSIS — I4891 Unspecified atrial fibrillation: Secondary | ICD-10-CM

## 2015-09-22 LAB — POCT INR: INR: 3.5

## 2015-10-01 ENCOUNTER — Ambulatory Visit (INDEPENDENT_AMBULATORY_CARE_PROVIDER_SITE_OTHER): Payer: Managed Care, Other (non HMO) | Admitting: Sports Medicine

## 2015-10-01 ENCOUNTER — Encounter: Payer: Self-pay | Admitting: Sports Medicine

## 2015-10-01 DIAGNOSIS — M79672 Pain in left foot: Secondary | ICD-10-CM | POA: Diagnosis not present

## 2015-10-01 DIAGNOSIS — M79671 Pain in right foot: Secondary | ICD-10-CM

## 2015-10-01 DIAGNOSIS — B351 Tinea unguium: Secondary | ICD-10-CM | POA: Diagnosis not present

## 2015-10-01 DIAGNOSIS — M14671 Charcot's joint, right ankle and foot: Secondary | ICD-10-CM | POA: Diagnosis not present

## 2015-10-01 DIAGNOSIS — E1142 Type 2 diabetes mellitus with diabetic polyneuropathy: Secondary | ICD-10-CM

## 2015-10-01 DIAGNOSIS — Z86711 Personal history of pulmonary embolism: Secondary | ICD-10-CM | POA: Insufficient documentation

## 2015-10-01 DIAGNOSIS — E1121 Type 2 diabetes mellitus with diabetic nephropathy: Secondary | ICD-10-CM | POA: Insufficient documentation

## 2015-10-01 NOTE — Progress Notes (Signed)
Subjective: Bobby Lindsey is a 60 y.o. male patient with history of diabetes who presents to office today complaining of long, painful nails  while ambulating in shoes; unable to trim. Patient states that the glucose reading this morning was high; 300's; which is normal for him. Patient denies any new changes in medication or new problems. Patient denies any new cramping, numbness, burning or tingling in the legs.  Patient Active Problem List   Diagnosis Date Noted  . History of pulmonary embolism 10/01/2015  . 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 Cellulitis of elbow 01/15/2015  . Septic olecranon bursitis 01/15/2015  . Tabes dorsalis 12/16/2013  . Persistent cough 12/16/2013  . Encounter for therapeutic drug monitoring 04/14/2013  . Other pulmonary embolism and infarction 04/14/2013  . History of recurrent DVT/E 04/11/2013  . Obstructive sleep apnea 04/11/2013  . Bradycardia 04/11/2013  . History of Elevated CK level  04/11/2013  . Obesity 04/11/2013  . Abnormal levels of other serum enzymes 04/11/2013  . Acute thromboembolism of deep veins of lower extremity ([place]) 04/11/2013  . Peripheral neuropathy ([place])   . Charcot's arthropathy   . Chronic obstructive pulmonary disease ([place])   . Acute on chronic diastolic heart failure ([place]) 04/08/2013  . Atrial fibrillation ([place]) 04/07/2013  . Acute myocardial infarction of inferolateral wall ([facility]) 04/05/2013  . Abnormal glucose measurement 07/18/2011  . Type 2 diabetes mellitus ([place]) 07/18/2011  . Reactive depression (situ[place]) 05/16/2011  . Long term (current) use of anticoagulants 09/21/2010  . Muscle pain 09/06/2010  . 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 ED (erectile dysfunction) of organic origin 07/28/2010  . HYPERKALEMIA 07/20/2009  . Uncontrolled type 2 diabetes mellitus with insulin therapy ([place]) 05/26/2008  . Hyperlipidemia 05/26/2008  . Hypertension 05/26/2008  .  CAD (coronary artery disease) 05/26/2008   Current Outpatient Prescriptions on File Prior to Visit  Medication Sig Dispense Refill  . clotrimazole-betamethasone (LOTRISONE) cream Apply 1 application topically 2 (two) times daily. 30 g 2  . ezetimibe (ZETIA) 10 MG tablet Take 1 tablet (10 mg total) by mouth daily. 30 tablet 11  . furosemide (LASIX) 40 MG tablet Take 1 tablet (40 mg total) by mouth daily. 30 tablet 5  . gabapentin (NEURONTIN) 100 MG capsule Take 100 mg by mouth at bedtime.     . insulin glargine (LANTUS) 100 UNIT/ML injection Inject 40 Units into the skin 2 (two) times daily.     . insulin lispro (HUMALOG KWIKPEN) 100 UNIT/ML injection Inject 20 Units into the skin 3 (three) times daily before meals. UAD    . isosorbide mononitrate (IMDUR) 30 MG 24 hr tablet Take 1 tablet (30 mg total) by mouth daily. 90 tablet 3  . lidocaine (LIDODERM) 5 % Place 1 patch onto the skin daily. Remove & Discard patch within 12 hours or as directed by MD 30 patch 0  . Liraglutide (VICTOZA) 18 MG/3ML SOPN Inject into the skin.    Marland Kitchen losartan (COZAAR) 100 MG tablet Take 1 tablet (100 mg total) by mouth daily. 90 tablet 3  . metoprolol succinate (TOPROL-XL) 25 MG 24 hr tablet Take 0.5 tablets (12.5 mg total) by mouth daily. 45 tablet 2  . nitroGLYCERIN (NITROSTAT) 0.4 MG SL tablet Place 0.4 mg under the tongue every 5 (five) minutes as needed for chest pain.    . pantoprazole (PROTONIX) 40 MG tablet Take 1 tablet (40 mg total) by mouth as directed. Take 40 mg every other day for 3 doses then  stop; you can take as needed for indigestion    . warfarin (COUMADIN) 5 MG tablet TAKE 2 TO 3 TABLETS BY MOUTH DAILY AS DIRECTED BY COUMADIN CLINIC 70 tablet 3   No current facility-administered medications on file prior to visit.    Allergies  Allergen Reactions  . Simvastatin Other (See Comments)    fatigue    Recent Results (from the past 2160 hour(s))  POCT INR     Status: None   Collection Time: 07/06/15   2:36 PM  Result Value Ref Range   INR 2.7   POCT INR     Status: None   Collection Time: 09/08/15  2:55 PM  Result Value Ref Range   INR 1.7   POCT INR     Status: None   Collection Time: 09/22/15  3:23 PM  Result Value Ref Range   INR 3.5    Objective: General: Patient is awake, alert, and oriented x 3 and in no acute distress.  Integument: Skin is warm, dry and supple bilateral. Nails are tender, long, thickened and dystrophic with subungual debris, consistent with onychomycosis, 1-5 bilateral. No signs of infection. No open lesions or preulcerative lesions present bilateral. Remaining integument unremarkable.  Vasculature:  Dorsalis Pedis pulse 2/4 bilateral. Posterior Tibial pulse  2/4 bilateral. Capillary fill time <3 sec 1-5 bilateral. Scant hair growth to the level of the digits.Temperature gradient within normal limits. No varicosities present bilateral. No edema present bilateral.   Neurology: The patient has absent sensation measured with a 5.07/10g Semmes Weinstein Monofilament at all pedal sites bilateral . Vibratory sensation absent bilateral with tuning fork. No Babinski sign present bilateral.   Musculoskeletal: No symptomatic pedal deformities noted bilateral. Pes planus with Charcot non-active and midfoot collapse on right. Muscular strength 5/5 in all lower extremity muscular groups bilateral without pain on range of motion. No tenderness with calf compression bilateral.  Assessment and Plan: Problem List Items Addressed This Visit    None    Visit Diagnoses    Dermatophytosis of nail    -  Primary   Foot pain, bilateral       Diabetic polyneuropathy associated with type 2 diabetes mellitus ([place])       Charcot's joint of right foot         -Examined patient. -Discussed and educated patient on diabetic foot care, especially with  regards to the vascular, neurological and musculoskeletal systems.  -Stressed the importance of good glycemic control and the detriment  of not  controlling glucose levels in relation to the foot. -Mechanically debrided all nails 1-5 bilateral using sterile nail nipper and filed with dremel without incident  -Answered all patient questions -Patient to return in 3 months for at risk foot care -Patient advised to call the office if any problems or questions arise in the meantime.  Landis Martins, DPM

## 2015-10-01 NOTE — Patient Instructions (Signed)
Diabetes and Foot Care Diabetes may cause you to have problems because of poor blood supply (circulation) to your feet and legs. This may cause the skin on your feet to become thinner, break easier, and heal more slowly. Your skin may become dry, and the skin may peel and crack. You may also have nerve damage in your legs and feet causing decreased feeling in them. You may not notice minor injuries to your feet that could lead to infections or more serious problems. Taking care of your feet is one of the most important things you can do for yourself.  HOME CARE INSTRUCTIONS  Wear shoes at all times, even in the house. Do not go barefoot. Bare feet are easily injured.  Check your feet daily for blisters, cuts, and redness. If you cannot see the bottom of your feet, use a mirror or ask someone for help.  Wash your feet with warm water (do not use hot water) and mild soap. Then pat your feet and the areas between your toes until they are completely dry. Do not soak your feet as this can dry your skin.  Apply a moisturizing lotion or petroleum jelly (that does not contain alcohol and is unscented) to the skin on your feet and to dry, brittle toenails. Do not apply lotion between your toes.  Trim your toenails straight across. Do not dig under them or around the cuticle. File the edges of your nails with an emery board or nail file.  Do not cut corns or calluses or try to remove them with medicine.  Wear clean socks or stockings every day. Make sure they are not too tight. Do not wear knee-high stockings since they may decrease blood flow to your legs.  Wear shoes that fit properly and have enough cushioning. To break in new shoes, wear them for just a few hours a day. This prevents you from injuring your feet. Always look in your shoes before you put them on to be sure there are no objects inside.  Do not cross your legs. This may decrease the blood flow to your feet.  If you find a minor scrape,  cut, or break in the skin on your feet, keep it and the skin around it clean and dry. These areas may be cleansed with mild soap and water. Do not cleanse the area with peroxide, alcohol, or iodine.  When you remove an adhesive bandage, be sure not to damage the skin around it.  If you have a wound, look at it several times a day to make sure it is healing.  Do not use heating pads or hot water bottles. They may burn your skin. If you have lost feeling in your feet or legs, you may not know it is happening until it is too late.  Make sure your health care provider performs a complete foot exam at least annually or more often if you have foot problems. Report any cuts, sores, or bruises to your health care provider immediately. SEEK MEDICAL CARE IF:   You have an injury that is not healing.  You have cuts or breaks in the skin.  You have an ingrown nail.  You notice redness on your legs or feet.  You feel burning or tingling in your legs or feet.  You have pain or cramps in your legs and feet.  Your legs or feet are numb.  Your feet always feel cold. SEEK IMMEDIATE MEDICAL CARE IF:   There is increasing redness,   swelling, or pain in or around a wound.  There is a red line that goes up your leg.  Pus is coming from a wound.  You develop a fever or as directed by your health care provider.  You notice a bad smell coming from an ulcer or wound.   This information is not intended to replace advice given to you by your health care provider. Make sure you discuss any questions you have with your health care provider.   Document Released: 01/28/2000 Document Revised: 10/02/2012 Document Reviewed: 07/09/2012 Elsevier Interactive Patient Education 2016 [employer]  

## 2015-10-06 ENCOUNTER — Ambulatory Visit (INDEPENDENT_AMBULATORY_CARE_PROVIDER_SITE_OTHER): Payer: Managed Care, Other (non HMO)

## 2015-10-06 DIAGNOSIS — Z7901 Long term (current) use of anticoagulants: Secondary | ICD-10-CM | POA: Diagnosis not present

## 2015-10-06 DIAGNOSIS — I82409 Acute embolism and thrombosis of unspecified deep veins of unspecified lower extremity: Secondary | ICD-10-CM | POA: Diagnosis not present

## 2015-10-06 DIAGNOSIS — I2699 Other pulmonary embolism without acute cor pulmonale: Secondary | ICD-10-CM | POA: Diagnosis not present

## 2015-10-06 DIAGNOSIS — Z5181 Encounter for therapeutic drug level monitoring: Secondary | ICD-10-CM

## 2015-10-06 DIAGNOSIS — I2119 ST elevation (STEMI) myocardial infarction involving other coronary artery of inferior wall: Secondary | ICD-10-CM

## 2015-10-06 DIAGNOSIS — I4891 Unspecified atrial fibrillation: Secondary | ICD-10-CM

## 2015-10-06 LAB — POCT INR: INR: 2.1

## 2015-10-22 ENCOUNTER — Other Ambulatory Visit: Payer: Self-pay | Admitting: *Deleted

## 2015-10-22 MED ORDER — WARFARIN SODIUM 5 MG PO TABS: ORAL_TABLET | ORAL | 3 refills | Status: DC

## 2015-11-10 ENCOUNTER — Ambulatory Visit (INDEPENDENT_AMBULATORY_CARE_PROVIDER_SITE_OTHER): Payer: Managed Care, Other (non HMO)

## 2015-11-10 DIAGNOSIS — Z5181 Encounter for therapeutic drug level monitoring: Secondary | ICD-10-CM

## 2015-11-10 DIAGNOSIS — I2699 Other pulmonary embolism without acute cor pulmonale: Secondary | ICD-10-CM

## 2015-11-10 DIAGNOSIS — Z7901 Long term (current) use of anticoagulants: Secondary | ICD-10-CM

## 2015-11-10 DIAGNOSIS — I4891 Unspecified atrial fibrillation: Secondary | ICD-10-CM

## 2015-11-10 DIAGNOSIS — I82409 Acute embolism and thrombosis of unspecified deep veins of unspecified lower extremity: Secondary | ICD-10-CM

## 2015-11-10 DIAGNOSIS — I2119 ST elevation (STEMI) myocardial infarction involving other coronary artery of inferior wall: Secondary | ICD-10-CM

## 2015-11-10 LAB — POCT INR: INR: 2.4

## 2015-11-15 ENCOUNTER — Encounter (HOSPITAL_COMMUNITY): Payer: Self-pay | Admitting: Nurse Practitioner

## 2015-11-15 ENCOUNTER — Emergency Department (HOSPITAL_COMMUNITY): Payer: Managed Care, Other (non HMO)

## 2015-11-15 ENCOUNTER — Emergency Department (HOSPITAL_COMMUNITY)
Admission: EM | Admit: 2015-11-15 | Discharge: 2015-11-16 | Disposition: A | Discharge status: Home / Self Care | Payer: Managed Care, Other (non HMO) | Attending: Physician Assistant | Admitting: Physician Assistant

## 2015-11-15 DIAGNOSIS — J449 Chronic obstructive pulmonary disease, unspecified: Secondary | ICD-10-CM | POA: Diagnosis not present

## 2015-11-15 DIAGNOSIS — R600 Localized edema: Secondary | ICD-10-CM | POA: Insufficient documentation

## 2015-11-15 DIAGNOSIS — Z7901 Long term (current) use of anticoagulants: Secondary | ICD-10-CM | POA: Insufficient documentation

## 2015-11-15 DIAGNOSIS — E1165 Type 2 diabetes mellitus with hyperglycemia: Secondary | ICD-10-CM | POA: Diagnosis not present

## 2015-11-15 DIAGNOSIS — R42 Dizziness and giddiness: Secondary | ICD-10-CM

## 2015-11-15 DIAGNOSIS — I959 Hypotension, unspecified: Secondary | ICD-10-CM | POA: Diagnosis not present

## 2015-11-15 DIAGNOSIS — I2510 Atherosclerotic heart disease of native coronary artery without angina pectoris: Secondary | ICD-10-CM | POA: Insufficient documentation

## 2015-11-15 DIAGNOSIS — Z955 Presence of coronary angioplasty implant and graft: Secondary | ICD-10-CM | POA: Diagnosis not present

## 2015-11-15 DIAGNOSIS — M545 Low back pain: Secondary | ICD-10-CM | POA: Insufficient documentation

## 2015-11-15 DIAGNOSIS — E1140 Type 2 diabetes mellitus with diabetic neuropathy, unspecified: Secondary | ICD-10-CM | POA: Insufficient documentation

## 2015-11-15 DIAGNOSIS — I252 Old myocardial infarction: Secondary | ICD-10-CM | POA: Diagnosis not present

## 2015-11-15 DIAGNOSIS — E1121 Type 2 diabetes mellitus with diabetic nephropathy: Secondary | ICD-10-CM | POA: Insufficient documentation

## 2015-11-15 DIAGNOSIS — Z951 Presence of aortocoronary bypass graft: Secondary | ICD-10-CM | POA: Insufficient documentation

## 2015-11-15 DIAGNOSIS — Z794 Long term (current) use of insulin: Secondary | ICD-10-CM | POA: Diagnosis not present

## 2015-11-15 DIAGNOSIS — R739 Hyperglycemia, unspecified: Secondary | ICD-10-CM

## 2015-11-15 LAB — CBC
HCT: 47.3 % (ref 39.0–52.0)
Hemoglobin: 16 g/dL (ref 13.0–17.0)
MCH: 29.4 pg (ref 26.0–34.0)
MCHC: 33.8 g/dL (ref 30.0–36.0)
MCV: 86.9 fL (ref 78.0–100.0)
Platelets: 181 10*3/uL (ref 150–400)
RBC: 5.44 MIL/uL (ref 4.22–5.81)
RDW: 13.9 % (ref 11.5–15.5)
WBC: 8.2 10*3/uL (ref 4.0–10.5)

## 2015-11-15 LAB — APTT: aPTT: 33 seconds (ref 24–36)

## 2015-11-15 LAB — COMPREHENSIVE METABOLIC PANEL
ALT: 23 U/L (ref 17–63)
AST: 20 U/L (ref 15–41)
Albumin: 4 g/dL (ref 3.5–5.0)
Alkaline Phosphatase: 53 U/L (ref 38–126)
Anion gap: 8 (ref 5–15)
BUN: 21 mg/dL — ABNORMAL HIGH (ref 6–20)
CO2: 30 mmol/L (ref 22–32)
Calcium: 9.5 mg/dL (ref 8.9–10.3)
Chloride: 97 mmol/L — ABNORMAL LOW (ref 101–111)
Creatinine, Ser: 1.47 mg/dL — ABNORMAL HIGH (ref 0.61–1.24)
GFR calc Af Amer: 58 mL/min — ABNORMAL LOW (ref >60)
GFR calc non Af Amer: 50 mL/min — ABNORMAL LOW (ref >60)
Glucose, Bld: 286 mg/dL — ABNORMAL HIGH (ref 65–99)
Potassium: 4.3 mmol/L (ref 3.5–5.1)
Sodium: 135 mmol/L (ref 135–145)
Total Bilirubin: 0.7 mg/dL (ref 0.3–1.2)
Total Protein: 7.3 g/dL (ref 6.5–8.1)

## 2015-11-15 LAB — I-STAT CHEM 8, ED
BUN: 26 mg/dL — ABNORMAL HIGH (ref 6–20)
Calcium, Ion: 1.19 mmol/L (ref 1.15–1.40)
Chloride: 95 mmol/L — ABNORMAL LOW (ref 101–111)
Creatinine, Ser: 1.4 mg/dL — ABNORMAL HIGH (ref 0.61–1.24)
Glucose, Bld: 285 mg/dL — ABNORMAL HIGH (ref 65–99)
HCT: 50 % (ref 39.0–52.0)
Hemoglobin: 17 g/dL (ref 13.0–17.0)
Potassium: 4.4 mmol/L (ref 3.5–5.1)
Sodium: 138 mmol/L (ref 135–145)
TCO2: 29 mmol/L (ref 0–100)

## 2015-11-15 LAB — I-STAT TROPONIN, ED: Troponin i, poc: 0 ng/mL (ref 0.00–0.08)

## 2015-11-15 LAB — DIFFERENTIAL
Basophils Absolute: 0 10*3/uL (ref 0.0–0.1)
Basophils Relative: 1 %
Eosinophils Absolute: 0.2 10*3/uL (ref 0.0–0.7)
Eosinophils Relative: 3 %
Lymphocytes Relative: 34 %
Lymphs Abs: 2.8 10*3/uL (ref 0.7–4.0)
Monocytes Absolute: 0.8 10*3/uL (ref 0.1–1.0)
Monocytes Relative: 10 %
Neutro Abs: 4.3 10*3/uL (ref 1.7–7.7)
Neutrophils Relative %: 52 %

## 2015-11-15 LAB — PROTIME-INR
INR: 1.85
Prothrombin Time: 21.6 seconds — ABNORMAL HIGH (ref 11.4–15.2)

## 2015-11-15 MED ORDER — SODIUM CHLORIDE 0.9 % IV BOLUS (SEPSIS)
1000.0000 mL | Freq: Once | INTRAVENOUS | Status: DC
Start: 2015-11-15 — End: 2015-11-16

## 2015-11-15 NOTE — ED Triage Notes (Signed)
Pt presents with c/o dizziness. He reports the dizziness started today. He went to doctors appt today and they sent him here for further evaluation of dizziness and hypotension today. He reports decreased fluid intake today. He reports his vision felt blurred earlier but has improved. He denies fevers, syncope, n/v. He is alert and breathing easily.

## 2015-11-15 NOTE — ED Notes (Signed)
Orthostatic vital signs completed at this time.  Pt's bp while supine was noted to be 157/90 with a heart rate of 67.  Pt's bp while sitting was noted to be 151/74 with a heart rate of 65 bpm.  Pt's bp while standing was noted to be 151/74 with a heart rate of 67 bpm.  Provider notified of pt's orthostatic vs.

## 2015-11-15 NOTE — ED Provider Notes (Signed)
[facility] DEPT Provider Note   CSN: [id number] Arrival date & time: 11/15/15  1601     History   Chief Complaint Chief Complaint  Patient presents with  . Dizziness    HPI Bobby Lindsey is a 60 y.o. male.   Dizziness  Quality:  Lightheadedness and imbalance Severity:  Moderate Onset quality:  Gradual Timing:  Intermittent Progression:  Resolved Chronicity:  Recurrent Context: medication (gabapentin) and standing up   Context: not when bending over, not with bowel movement, not with ear pain, not with eye movement, not with head movement, not with inactivity, not with loss of consciousness, not with physical activity and not when urinating   Relieved by:  Nothing Worsened by:  Nothing Ineffective treatments:  None tried Associated symptoms: no blood in stool, no chest pain, no diarrhea, no headaches, no hearing loss, no nausea, no palpitations, no shortness of breath, no syncope, no tinnitus, no vision changes, no vomiting and no weakness   Risk factors: heart disease, multiple medications and new medications   Risk factors: no anemia, no hx of stroke and no hx of vertigo      Patient is a 60 year old male who was sent to the ER today for evaluation of lightheadedness, dizziness with hypotension and hypoglycemia after being evaluated in the [facility] office today in [place].  An EKG was obtained and the patient's presentation and results were run by his internal medicine doctor, Dr. Caryl Comes, and it was decided that he should be brought to the ER for evaluation.  Patient states that he was going to the orthopedic surgeon today for evaluation of low back pain with hopes of getting physical therapy. He took his gabapentin and felt somewhat sleepy and lightheaded while he was headed to his appointment. He denies any slurred speech, shortness of breath, chest pain. He ate this morning but has not drinking very much. He reports having elevated blood sugars recently over the past  couple weeks which have been difficult to control.  He denies any recent illness denies nausea, vomiting, abdominal pain. He has paresthesias in all extremities and intermittently takes gabapentin.  He has no other complaints.  He denies shortness of breath, cough, wheeze, no extremity edema, palpitations.  Past Medical History:  Diagnosis Date  . Atrial fibrillation ([place])    a. Transient during 03/2013 admission.  . Bradycardia    a. Bradycardia/pauses/possible Mobitz II during 03/2013 adm. Not on BB due to this.  Marland Kitchen CAD (coronary artery disease)    a. s/p CABG 2002. b. Hx Cypher stent to the RCA. c. Inf-lat STEMI 03/2013:  LHC (04/05/13):  mLAD occluded, pD1 90, apical br of Dx occluded, CFX occluded, pOM1 90-95, RCA stents patent, diff RCA 30, S-Dx occluded, S-PDA occluded, S-OM1 40-50, L-LAD patent, EF 40% with inf HK.  PCI:  Promus (2.5 x 28) DES to mid to dist CFX.  Marland Kitchen Charcot's joint of knee    UNSPECIFIED AREA  . COPD (chronic obstructive pulmonary disease) ([place])   . Deep venous thrombosis (HCC)    right lower extremity  . Diabetes mellitus    a. A1C 10.7 in 03/2013.  . Diastolic CHF ([place])    a. EF 40% by cath, 55-60% during 03/2013 adm, required IV diuresis.  . DVT, lower extremity, recurrent ([place])    a. Hx recurrent DVT per record.  . Dyslipidemia   . Elevated CK    a. Pt has refused rheum workup in the past.  . GERD (gastroesophageal reflux disease)   .  HTN (hypertension)    x 15 years  . Hx of cardiovascular stress test    a. Lexiscan Myoview (03/2010):  diaph atten vs inf scar, no ischemia, EF 47%; Low Risk.  Marland Kitchen Hx of echocardiogram    a. Echo (04/08/13):  Mild LVH, EF 55-60%, restrictive physiology, severe LAE, mild reduced RVSF, mild RAE.  . Leg pain    ABI 6/16:  R 1.2, L 1.1 - normal  . Obesity   . Peripheral neuropathy ([place])   . Pulmonary emboli ([place])    a. Hx PE per record.  . Pulmonary embolism ([place])   . Sleep apnea     Patient Active Problem List   Diagnosis Date  Noted  . History of pulmonary embolism 10/01/2015  . 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 Cellulitis of elbow 01/15/2015  . Septic olecranon bursitis 01/15/2015  . Tabes dorsalis 12/16/2013  . Persistent cough 12/16/2013  . Encounter for therapeutic drug monitoring 04/14/2013  . Other pulmonary embolism and infarction 04/14/2013  . History of recurrent DVT/E 04/11/2013  . Obstructive sleep apnea 04/11/2013  . Bradycardia 04/11/2013  . History of Elevated CK level  04/11/2013  . Obesity 04/11/2013  . Abnormal levels of other serum enzymes 04/11/2013  . Acute thromboembolism of deep veins of lower extremity ([place]) 04/11/2013  . Peripheral neuropathy ([place])   . Charcot's arthropathy   . Chronic obstructive pulmonary disease ([place])   . Acute on chronic diastolic heart failure ([place]) 04/08/2013  . Atrial fibrillation ([place]) 04/07/2013  . Acute myocardial infarction of inferolateral wall (HCC) 04/05/2013  . Abnormal glucose measurement 07/18/2011  . Type 2 diabetes mellitus ([place]) 07/18/2011  . Reactive depression (sit[place]) 05/16/2011  . Long term (current) use of anticoagulants 09/21/2010  . Muscle pain 09/06/2010  . 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 ED (erectile dysfunction) of organic origin 07/28/2010  . HYPERKALEMIA 07/20/2009  . Uncontrolled type 2 diabetes mellitus with insulin therapy ([place]) 05/26/2008  . Hyperlipidemia 05/26/2008  . Hypertension 05/26/2008  . CAD (coronary artery disease) 05/26/2008    Past Surgical History:  Procedure Laterality Date  . CORONARY ANGIOPLASTY WITH STENT PLACEMENT    . CORONARY ARTERY BYPASS GRAFT     4 time since 2002  . LEFT HEART CATHETERIZATION WITH CORONARY/GRAFT ANGIOGRAM  04/05/2013   Procedure: LEFT HEART CATHETERIZATION WITH Beatrix Fetters;  Surgeon: Peter M Martinique, MD;  Location: [facility] CATH LAB;  Service: Cardiovascular;;  . left knee surgery    . PERCUTANEOUS  CORONARY STENT INTERVENTION (PCI-S)  04/05/2013   Procedure: PERCUTANEOUS CORONARY STENT INTERVENTION (PCI-S);  Surgeon: Peter M Martinique, MD;  Location[facility] CATH LAB;  Service: Cardiovascular;;  DES to native Mid cx       Home Medications    Prior to Admission medications   Medication Sig Start Date End Date Taking? Authorizing Provider  acetaminophen (TYLENOL) 500 MG tablet Take 1,500 mg by mouth every 6 (six) hours as needed for mild pain.   Yes Historical Provider, MD  furosemide (LASIX) 40 MG tablet Take 1 tablet (40 mg total) by mouth daily. 07/14/15  Yes Minus Breeding, MD  gabapentin (NEURONTIN) 100 MG capsule Take 300 mg by mouth 2 (two) times daily.  06/15/14 11/16/15 Yes Historical Provider, MD  Insulin Degludec (TRESIBA FLEXTOUCH) 200 UNIT/ML SOPN Inject 80 Units into the skin every evening.   Yes Historical Provider, MD  insulin lispro (HUMALOG KWIKPEN) 100 UNIT/ML injection Inject 20 Units into the skin 3 (three) times daily  before meals. UAD   Yes Historical Provider, MD  isosorbide mononitrate (IMDUR) 30 MG 24 hr tablet Take 1 tablet (30 mg total) by mouth daily. 11/30/14  Yes Scott T Kathlen Mody, PA-C  Liraglutide (VICTOZA) 18 MG/3ML SOPN Inject 0.8 mg into the skin every 3 (three) days.  06/28/15  Yes Historical Provider, MD  losartan (COZAAR) 100 MG tablet Take 1 tablet (100 mg total) by mouth daily. 03/08/15  Yes Scott Joylene Draft, PA-C  metoprolol succinate (TOPROL-XL) 25 MG 24 hr tablet Take 0.5 tablets (12.5 mg total) by mouth daily. 09/13/15  Yes Scott T Kathlen Mody, PA-C  nitroGLYCERIN (NITROSTAT) 0.4 MG SL tablet Place 0.4 mg under the tongue every 5 (five) minutes as needed for chest pain.   Yes Historical Provider, MD  pantoprazole (PROTONIX) 40 MG tablet Take 1 tablet (40 mg total) by mouth as directed. Take 40 mg every other day for 3 doses then stop; you can take as needed for indigestion Patient taking differently: Take 20 mg by mouth daily.  07/06/15  Yes Liliane Shi, PA-C  warfarin  (COUMADIN) 5 MG tablet TAKE 2 TO 3 TABLETS BY MOUTH DAILY AS DIRECTED BY COUMADIN CLINIC Patient taking differently: Take 10 mg by mouth every evening.  10/22/15  Yes Minus Breeding, MD    Family History Family History  Problem Relation Age of Onset  . Heart disease Mother   . Heart attack Neg Hx   . Hypertension Mother   . Hypertension Sister   . Cancer Father   . Stroke Neg Hx     Social History Social History  Substance Use Topics  . Smoking status: Never Smoker  . Smokeless tobacco: Never Used  . Alcohol use Yes     Comment: rare     Allergies   Simvastatin   Review of Systems Review of Systems  Constitutional: Negative for activity change, appetite change, chills, diaphoresis, fatigue and fever.  HENT: Negative.  Negative for hearing loss and tinnitus.   Respiratory: Negative for cough, chest tightness, shortness of breath and wheezing.   Cardiovascular: Negative.  Negative for chest pain, palpitations, leg swelling and syncope.  Gastrointestinal: Negative.  Negative for abdominal distention, abdominal pain, blood in stool, diarrhea, nausea and vomiting.  Genitourinary: Negative.   Musculoskeletal: Negative.   Skin: Negative.   Neurological: Positive for dizziness. Negative for weakness and headaches.  All other systems reviewed and are negative.    Physical Exam Updated Vital Signs BP 146/87   Pulse 64   Temp 98.6 F (37 C)   Resp 12   SpO2 (!) 87%   Physical Exam  Constitutional: He is oriented to person, place, and time. Vital signs are normal. He appears well-developed and well-nourished. He is cooperative.  Non-toxic appearance. He does not have a sickly appearance. He does not appear ill. No distress.  HENT:  Head: Normocephalic and atraumatic.  Right Ear: External ear normal.  Left Ear: External ear normal.  Nose: Nose normal.  Mouth/Throat: Oropharynx is clear and moist. No oropharyngeal exudate.  Eyes: Conjunctivae, EOM and lids are normal.  Pupils are equal, round, and reactive to light. Right eye exhibits no discharge. Left eye exhibits no discharge. No scleral icterus. Right eye exhibits normal extraocular motion and no nystagmus. Left eye exhibits normal extraocular motion and no nystagmus.  Neck: Normal range of motion. Neck supple. No JVD present. No tracheal deviation present.  Cardiovascular: Normal rate, regular rhythm, normal heart sounds and intact distal pulses.  Exam reveals no  gallop and no friction rub.   No murmur heard. Pulmonary/Chest: Effort normal and breath sounds normal. No stridor. No respiratory distress. He has no wheezes. He has no rales. He exhibits no tenderness.  Abdominal: Soft. Bowel sounds are normal. He exhibits no distension and no mass. There is no tenderness. There is no rebound and no guarding.  Protuberant abdomen, non-tender  Musculoskeletal: Normal range of motion. He exhibits tenderness. He exhibits no edema.  Midline lumbar spinal and paraspinal tenderness  Lymphadenopathy:    He has no cervical adenopathy.  Neurological: He is alert and oriented to person, place, and time. He has normal strength. He displays no tremor. A sensory deficit is present. No cranial nerve deficit. He exhibits normal muscle tone. Coordination and gait normal.  Speech is clear and goal oriented, follows commands Major Cranial nerves without deficit, no facial droop Normal strength in upper and lower extremities bilaterally including dorsiflexion and plantar flexion, strong and equal grip strength Decreased sensation to light touch to bilateral LE from mid-shin and distally Moves extremities without ataxia, coordination intact Normal finger to nose and rapid alternating movements Neg romberg, no pronator drift Normal gait and balance   Skin: Skin is warm and dry. No rash noted. He is not diaphoretic. No erythema. No pallor.  Bilateral LE with hyperpigmentation, 1+ pitting edema bilaterally  Psychiatric: He has a  normal mood and affect. His behavior is normal. Judgment and thought content normal.  Nursing note and vitals reviewed.    ED Treatments / Results  Labs (all labs ordered are listed, but only abnormal results are displayed) Labs Reviewed  PROTIME-INR - Abnormal; Notable for the following:       Result Value   Prothrombin Time 21.6 (*)    All other components within normal limits  COMPREHENSIVE METABOLIC PANEL - Abnormal; Notable for the following:    Chloride 97 (*)    Glucose, Bld 286 (*)    BUN 21 (*)    Creatinine, Ser 1.47 (*)    GFR calc non Af Amer 50 (*)    GFR calc Af Amer 58 (*)    All other components within normal limits  URINALYSIS, ROUTINE W REFLEX MICROSCOPIC (NOT AT [facility]) - Abnormal; Notable for the following:    Glucose, UA >1000 (*)    All other components within normal limits  URINE MICROSCOPIC-ADD ON - Abnormal; Notable for the following:    Squamous Epithelial / LPF 0-5 (*)    Bacteria, UA FEW (*)    Casts HYALINE CASTS (*)    All other components within normal limits  I-STAT CHEM 8, ED - Abnormal; Notable for the following:    Chloride 95 (*)    BUN 26 (*)    Creatinine, Ser 1.40 (*)    Glucose, Bld 285 (*)    All other components within normal limits  APTT  CBC  DIFFERENTIAL  I-STAT TROPOININ, ED    EKG  EKG Interpretation  Date/Time:  Monday November 15 2015 16:05:32 EDT Ventricular Rate:  63 PR Interval:  198 QRS Duration: 90 QT Interval:  428 QTC Calculation: 437 R Axis:   52 Text Interpretation:  Normal sinus rhythm Possible Inferior infarct , age undetermined Cannot rule out Anterior infarct , age undetermined Abnormal ECG ekg resolved from prior Confirmed by Gerald Leitz ([id number]) on 11/15/2015 11:50:43 PM       Radiology Ct Head Wo Contrast  Result Date: 11/15/2015 CLINICAL DATA:  Acute onset of dizziness today. EXAM: CT HEAD  WITHOUT CONTRAST TECHNIQUE: Contiguous axial images were obtained from the base of the skull through the  vertex without intravenous contrast. COMPARISON:  08/18/2010 FINDINGS: Brain: No evidence of acute infarction, mass lesion, hemorrhage, hydrocephalus or extra-axial collection. Chronic small-vessel ischemic changes of the basal ganglia and deep white matter. Dilated perivascular space at the base of the brain on the left. No mass lesion, hemorrhage, hydrocephalus or extra-axial collection. Vascular: There is atherosclerotic calcification of the major vessels at the base of the brain. Skull: Normal Sinuses/Orbits: Clear/normal Other: None significant IMPRESSION: No acute finding by CT. Chronic small-vessel ischemic changes of the deep brain. Extensive atherosclerotic calcification of the major vessels at the base of the brain. Electronically Signed   By: Nelson Chimes M.D.   On: 11/15/2015 16:54    Procedures Procedures (including critical care time)  Medications Ordered in ED Medications  sodium chloride 0.9 % bolus 1,000 mL (0 mLs Intravenous Stopped 11/16/15 0132)     Initial Impression / Assessment and Plan / ED Course  I have reviewed the triage vital signs and the nursing notes.  Pertinent labs & imaging results that were available during my care of the patient were reviewed by me and considered in my medical decision making (see chart for details).  Clinical Course   Pt sent to the ER with hypotension, lightheadedness and reported abnormal EKG in orthopedic office, sent to the ER for eval.  Upon arrival patient has stable vital signs and is asymptomatic. Stroke workup was initiated at triage.  Head CT negative, troponin negative, PT INR slightly subtherapeutic, patient hyperglycemic 286, with elevated BUN and creatinine 21/1.47, baseline 16/1.02.  Urine pregnant for glucosuria.  Patient refused IV fluids and is tolerating PO's.    He has a normal neurological exam, EKG without change from prior.  Do not suspect cardiac event.  Other than back pain the patient complains of no symptoms here  in the ER.  He was not orthostatic.    Still his lightheadedness earlier at the doctor's office is secondary to hypotension, which has since resolved. Patient feels that it may be somewhat related to the gabapentin medication which she takes which causes some dizziness and sleepiness when he takes it. He did take this medicine just prior to his doctor's appointment earlier today.  Patient may be mildly dehydrated secondary to hyperglycemia, he states his sugars have been difficult to control recently and have often been over 300.    Case has been discussed with Dr. Thomasene Lot, he was seen and evaluated the patient. She agrees with discharge home with close follow-up for repeat of blood pressure and kidney function.  Patient was instructed to see endocrinology regarding uncontrolled diabetes.  He understands he should return to Dr. Olin Pia office in 1-2 weeks for repeat BMP and blood pressure recheck.  He was encouraged to take gabapentin as prescribed, as he has been currently taking intermittently here he can return to the prescribing doctor if he is not tolerating this medication.    Patient was discharged home in good condition with stable vital signs after eating and drinking in the ER.   The multiple times that I was in the room patient had normal oxygenation 97-100%, heart rate 60s to 80s, and blood pressure systolic 573U to 202R.  He had no respiratory distress or tachypnea.  Last vitals recorded automatically included a pulse ox of 87%, which I believe was in error.  Pt has been 97-100% throughout his time in the ER, he was ambulatory  and in good condition at the time of discharge, no respiratory sx, no distress, and normal pulmonary exam.    Final Clinical Impressions(s) / ED Diagnoses   Final diagnoses:  Lightheadedness  Hypotension, unspecified hypotension type  Hyperglycemia    New Prescriptions Discharge Medication List as of 11/16/2015  1:03 AM       Delsa Grana, PA-C 11/16/15  [place], MD 11/16/15 [id number]

## 2015-11-15 NOTE — ED Notes (Signed)
Pt states that he does not wish to have a PIV placed or to receive IV fluids at this time.

## 2015-11-16 LAB — URINALYSIS, ROUTINE W REFLEX MICROSCOPIC
Bilirubin Urine: NEGATIVE
Glucose, UA: >1000 mg/dL — AB
Hgb urine dipstick: NEGATIVE
Ketones, ur: NEGATIVE mg/dL
Leukocytes, UA: NEGATIVE
Nitrite: NEGATIVE
Protein, ur: NEGATIVE mg/dL
Specific Gravity, Urine: 1.029 (ref 1.005–1.030)
pH: 5.5 (ref 5.0–8.0)

## 2015-11-16 LAB — URINE MICROSCOPIC-ADD ON

## 2015-11-16 NOTE — ED Notes (Signed)
Pt provided with d/c instructions at this time. Pt verbalizes understanding of d/c instructions as well as follow up procedure after d/c. No new RX at time of d/c.  Pt in no apparent distress at this time. Pt ambulatory at time of d/c.   

## 2015-11-16 NOTE — ED Notes (Signed)
Pt and significant other provided with a boxed lunch at this time.  Awaiting UA results.

## 2015-11-30 ENCOUNTER — Other Ambulatory Visit: Payer: Self-pay

## 2015-11-30 DIAGNOSIS — I2510 Atherosclerotic heart disease of native coronary artery without angina pectoris: Secondary | ICD-10-CM

## 2015-11-30 MED ORDER — ISOSORBIDE MONONITRATE ER 30 MG PO TB24: 30.0000 mg | ORAL_TABLET | Freq: Every day | ORAL | 0 refills | Status: DC

## 2015-12-08 ENCOUNTER — Ambulatory Visit (INDEPENDENT_AMBULATORY_CARE_PROVIDER_SITE_OTHER): Payer: Managed Care, Other (non HMO)

## 2015-12-08 DIAGNOSIS — Z5181 Encounter for therapeutic drug level monitoring: Secondary | ICD-10-CM

## 2015-12-08 DIAGNOSIS — I4891 Unspecified atrial fibrillation: Secondary | ICD-10-CM

## 2015-12-08 DIAGNOSIS — I2699 Other pulmonary embolism without acute cor pulmonale: Secondary | ICD-10-CM | POA: Diagnosis not present

## 2015-12-08 DIAGNOSIS — Z7901 Long term (current) use of anticoagulants: Secondary | ICD-10-CM | POA: Diagnosis not present

## 2015-12-08 DIAGNOSIS — I2119 ST elevation (STEMI) myocardial infarction involving other coronary artery of inferior wall: Secondary | ICD-10-CM

## 2015-12-08 DIAGNOSIS — I82409 Acute embolism and thrombosis of unspecified deep veins of unspecified lower extremity: Secondary | ICD-10-CM | POA: Diagnosis not present

## 2015-12-08 LAB — POCT INR: INR: 3.2

## 2015-12-24 ENCOUNTER — Ambulatory Visit: Payer: Managed Care, Other (non HMO) | Admitting: Podiatry

## 2015-12-29 ENCOUNTER — Ambulatory Visit (INDEPENDENT_AMBULATORY_CARE_PROVIDER_SITE_OTHER): Payer: Managed Care, Other (non HMO)

## 2015-12-29 DIAGNOSIS — I2119 ST elevation (STEMI) myocardial infarction involving other coronary artery of inferior wall: Secondary | ICD-10-CM

## 2015-12-29 DIAGNOSIS — I82409 Acute embolism and thrombosis of unspecified deep veins of unspecified lower extremity: Secondary | ICD-10-CM | POA: Diagnosis not present

## 2015-12-29 DIAGNOSIS — I2699 Other pulmonary embolism without acute cor pulmonale: Secondary | ICD-10-CM

## 2015-12-29 DIAGNOSIS — I4891 Unspecified atrial fibrillation: Secondary | ICD-10-CM

## 2015-12-29 DIAGNOSIS — Z5181 Encounter for therapeutic drug level monitoring: Secondary | ICD-10-CM

## 2015-12-29 DIAGNOSIS — Z7901 Long term (current) use of anticoagulants: Secondary | ICD-10-CM

## 2015-12-29 LAB — POCT INR: INR: 2.7

## 2016-01-25 ENCOUNTER — Other Ambulatory Visit: Payer: Self-pay

## 2016-01-25 MED ORDER — FUROSEMIDE 40 MG PO TABS: 40.0000 mg | ORAL_TABLET | Freq: Every day | ORAL | 5 refills | Status: DC

## 2016-01-25 MED ORDER — PANTOPRAZOLE SODIUM 40 MG PO TBEC: 40.0000 mg | DELAYED_RELEASE_TABLET | ORAL | Status: DC

## 2016-02-01 ENCOUNTER — Ambulatory Visit: Payer: Self-pay | Admitting: Physician Assistant

## 2016-02-01 ENCOUNTER — Encounter: Payer: Self-pay | Admitting: Physician Assistant

## 2016-02-01 VITALS — BP 122/70 | HR 67 | Temp 98.5°F

## 2016-02-01 DIAGNOSIS — B9789 Other viral agents as the cause of diseases classified elsewhere: Principal | ICD-10-CM

## 2016-02-01 DIAGNOSIS — J069 Acute upper respiratory infection, unspecified: Secondary | ICD-10-CM

## 2016-02-01 MED ORDER — PSEUDOEPH-BROMPHEN-DM 30-2-10 MG/5ML PO SYRP: 5.0000 mL | ORAL_SOLUTION | Freq: Four times a day (QID) | ORAL | 0 refills | Status: DC | PRN

## 2016-02-01 NOTE — Progress Notes (Signed)
   Subjective:    Patient ID: Bobby Lindsey, male    DOB: 12/30/1955, 60 y.o.   MRN: [id number]  HPI Patient c/o 3 days of nasal congestion and productive cough. Denies fever/chill. States Body ache. Denies vomiting or diarrhea. No palliative measures for compliant.   Review of Systems    Diabetes and Hypertension Objective:   Physical Exam HEENT unremarkable. Neck supple, Lungs CTA with non-productive cough. Heart RRR       Assessment & Plan:URI  Bromfed DM and Ibuprofen as directed. Follow up 3 days if no improvement.

## 2016-03-03 ENCOUNTER — Other Ambulatory Visit: Payer: Self-pay | Admitting: *Deleted

## 2016-03-03 DIAGNOSIS — I2510 Atherosclerotic heart disease of native coronary artery without angina pectoris: Secondary | ICD-10-CM

## 2016-03-03 MED ORDER — ISOSORBIDE MONONITRATE ER 30 MG PO TB24: 30.0000 mg | ORAL_TABLET | Freq: Every day | ORAL | 0 refills | Status: DC

## 2016-03-26 DIAGNOSIS — Z86718 Personal history of other venous thrombosis and embolism: Secondary | ICD-10-CM | POA: Insufficient documentation

## 2016-03-27 ENCOUNTER — Other Ambulatory Visit: Payer: Self-pay | Admitting: *Deleted

## 2016-03-27 DIAGNOSIS — G4733 Obstructive sleep apnea (adult) (pediatric): Secondary | ICD-10-CM

## 2016-03-27 DIAGNOSIS — M5136 Other intervertebral disc degeneration, lumbar region: Secondary | ICD-10-CM | POA: Insufficient documentation

## 2016-03-27 MED ORDER — LOSARTAN POTASSIUM 100 MG PO TABS: 100.0000 mg | ORAL_TABLET | Freq: Every day | ORAL | 0 refills | Status: DC

## 2016-04-12 ENCOUNTER — Other Ambulatory Visit: Payer: Self-pay | Admitting: *Deleted

## 2016-04-12 MED ORDER — NITROGLYCERIN 0.4 MG SL SUBL: 0.4000 mg | SUBLINGUAL_TABLET | SUBLINGUAL | 0 refills | Status: DC | PRN

## 2016-04-12 NOTE — Telephone Encounter (Signed)
E-SENT TO PHARMACY- NO REFILLS - NEEDS APPOINTMENT

## 2016-05-03 ENCOUNTER — Ambulatory Visit (INDEPENDENT_AMBULATORY_CARE_PROVIDER_SITE_OTHER): Payer: Managed Care, Other (non HMO)

## 2016-05-03 DIAGNOSIS — Z5181 Encounter for therapeutic drug level monitoring: Secondary | ICD-10-CM | POA: Diagnosis not present

## 2016-05-03 DIAGNOSIS — I2119 ST elevation (STEMI) myocardial infarction involving other coronary artery of inferior wall: Secondary | ICD-10-CM | POA: Diagnosis not present

## 2016-05-03 LAB — POCT INR: INR: 3.8

## 2016-05-08 ENCOUNTER — Other Ambulatory Visit: Payer: Self-pay | Admitting: Pharmacist Clinician (PhC)/ Clinical Pharmacy Specialist

## 2016-05-08 MED ORDER — WARFARIN SODIUM 5 MG PO TABS: ORAL_TABLET | ORAL | 0 refills | Status: DC

## 2016-05-24 ENCOUNTER — Ambulatory Visit (INDEPENDENT_AMBULATORY_CARE_PROVIDER_SITE_OTHER): Payer: Managed Care, Other (non HMO)

## 2016-05-24 DIAGNOSIS — I4891 Unspecified atrial fibrillation: Secondary | ICD-10-CM

## 2016-05-24 DIAGNOSIS — I2119 ST elevation (STEMI) myocardial infarction involving other coronary artery of inferior wall: Secondary | ICD-10-CM

## 2016-05-24 DIAGNOSIS — Z5181 Encounter for therapeutic drug level monitoring: Secondary | ICD-10-CM

## 2016-05-24 LAB — POCT INR: INR: 3.7

## 2016-05-26 ENCOUNTER — Other Ambulatory Visit: Payer: Self-pay | Admitting: *Deleted

## 2016-05-26 DIAGNOSIS — I5032 Chronic diastolic (congestive) heart failure: Secondary | ICD-10-CM

## 2016-05-26 MED ORDER — METOPROLOL SUCCINATE ER 25 MG PO TB24: 12.5000 mg | ORAL_TABLET | Freq: Every day | ORAL | 0 refills | Status: DC

## 2016-06-13 ENCOUNTER — Other Ambulatory Visit: Payer: Self-pay | Admitting: *Deleted

## 2016-06-13 DIAGNOSIS — I2510 Atherosclerotic heart disease of native coronary artery without angina pectoris: Secondary | ICD-10-CM

## 2016-06-13 MED ORDER — ISOSORBIDE MONONITRATE ER 30 MG PO TB24: 30.0000 mg | ORAL_TABLET | Freq: Every day | ORAL | 0 refills | Status: DC

## 2016-06-19 ENCOUNTER — Other Ambulatory Visit: Payer: Self-pay

## 2016-06-19 MED ORDER — WARFARIN SODIUM 5 MG PO TABS: ORAL_TABLET | ORAL | 0 refills | Status: DC

## 2016-06-19 NOTE — Telephone Encounter (Signed)
Pt is overdue for follow-up, has appt scheduled for 06/21/16 will refill x 1 month supply only.

## 2016-06-26 ENCOUNTER — Other Ambulatory Visit: Payer: Self-pay | Admitting: Cardiology

## 2016-06-26 DIAGNOSIS — G4733 Obstructive sleep apnea (adult) (pediatric): Secondary | ICD-10-CM

## 2016-06-26 MED ORDER — LOSARTAN POTASSIUM 100 MG PO TABS: 100.0000 mg | ORAL_TABLET | Freq: Every day | ORAL | 0 refills | Status: DC

## 2016-07-18 ENCOUNTER — Other Ambulatory Visit: Payer: Self-pay

## 2016-07-18 ENCOUNTER — Telehealth: Payer: Self-pay | Admitting: Pharmacist Clinician (PhC)/ Clinical Pharmacy Specialist

## 2016-07-18 DIAGNOSIS — I5032 Chronic diastolic (congestive) heart failure: Secondary | ICD-10-CM

## 2016-07-18 MED ORDER — METOPROLOL SUCCINATE ER 25 MG PO TB24: 12.5000 mg | ORAL_TABLET | Freq: Every day | ORAL | 0 refills | Status: DC

## 2016-07-18 NOTE — Telephone Encounter (Signed)
Spoke with pt who is pass due, appt scheduled for tomorrow.

## 2016-07-19 ENCOUNTER — Ambulatory Visit (INDEPENDENT_AMBULATORY_CARE_PROVIDER_SITE_OTHER): Payer: Managed Care, Other (non HMO) | Admitting: *Deleted

## 2016-07-19 DIAGNOSIS — Z5181 Encounter for therapeutic drug level monitoring: Secondary | ICD-10-CM

## 2016-07-19 DIAGNOSIS — I2119 ST elevation (STEMI) myocardial infarction involving other coronary artery of inferior wall: Secondary | ICD-10-CM

## 2016-07-19 DIAGNOSIS — I4891 Unspecified atrial fibrillation: Secondary | ICD-10-CM

## 2016-07-19 LAB — POCT INR: INR: 3.2

## 2016-07-19 MED ORDER — WARFARIN SODIUM 5 MG PO TABS: ORAL_TABLET | ORAL | 1 refills | Status: DC

## 2016-08-03 ENCOUNTER — Other Ambulatory Visit: Payer: Self-pay | Admitting: *Deleted

## 2016-08-03 ENCOUNTER — Encounter: Payer: Self-pay | Admitting: *Deleted

## 2016-08-03 DIAGNOSIS — G4733 Obstructive sleep apnea (adult) (pediatric): Secondary | ICD-10-CM

## 2016-08-03 MED ORDER — PANTOPRAZOLE SODIUM 40 MG PO TBEC: 40.0000 mg | DELAYED_RELEASE_TABLET | ORAL | 0 refills | Status: DC | PRN

## 2016-08-03 MED ORDER — LOSARTAN POTASSIUM 100 MG PO TABS: 100.0000 mg | ORAL_TABLET | Freq: Every day | ORAL | 0 refills | Status: DC

## 2016-08-10 ENCOUNTER — Encounter: Payer: Self-pay | Admitting: Physician Assistant

## 2016-08-15 ENCOUNTER — Encounter (INDEPENDENT_AMBULATORY_CARE_PROVIDER_SITE_OTHER): Payer: Self-pay

## 2016-08-15 ENCOUNTER — Ambulatory Visit (INDEPENDENT_AMBULATORY_CARE_PROVIDER_SITE_OTHER): Payer: Managed Care, Other (non HMO) | Admitting: Nurse Practitioner

## 2016-08-15 ENCOUNTER — Encounter: Payer: Self-pay | Admitting: Nurse Practitioner

## 2016-08-15 ENCOUNTER — Ambulatory Visit (INDEPENDENT_AMBULATORY_CARE_PROVIDER_SITE_OTHER): Payer: Managed Care, Other (non HMO) | Admitting: *Deleted

## 2016-08-15 VITALS — BP 130/70 | HR 62 | Ht 72.0 in | Wt 274.1 lb

## 2016-08-15 DIAGNOSIS — I2119 ST elevation (STEMI) myocardial infarction involving other coronary artery of inferior wall: Secondary | ICD-10-CM

## 2016-08-15 DIAGNOSIS — I2510 Atherosclerotic heart disease of native coronary artery without angina pectoris: Secondary | ICD-10-CM | POA: Diagnosis not present

## 2016-08-15 DIAGNOSIS — Z5181 Encounter for therapeutic drug level monitoring: Secondary | ICD-10-CM

## 2016-08-15 DIAGNOSIS — I5032 Chronic diastolic (congestive) heart failure: Secondary | ICD-10-CM | POA: Diagnosis not present

## 2016-08-15 DIAGNOSIS — E7800 Pure hypercholesterolemia, unspecified: Secondary | ICD-10-CM

## 2016-08-15 DIAGNOSIS — I4891 Unspecified atrial fibrillation: Secondary | ICD-10-CM

## 2016-08-15 DIAGNOSIS — G4733 Obstructive sleep apnea (adult) (pediatric): Secondary | ICD-10-CM

## 2016-08-15 LAB — POCT INR: INR: 1.7

## 2016-08-15 MED ORDER — NITROGLYCERIN 0.4 MG SL SUBL
0.4000 mg | SUBLINGUAL_TABLET | SUBLINGUAL | 6 refills | Status: DC | PRN
Start: 2016-08-15 — End: 2019-12-01

## 2016-08-15 MED ORDER — PANTOPRAZOLE SODIUM 40 MG PO TBEC: 40.0000 mg | DELAYED_RELEASE_TABLET | ORAL | 11 refills | Status: DC | PRN

## 2016-08-15 MED ORDER — ISOSORBIDE MONONITRATE ER 30 MG PO TB24: 30.0000 mg | ORAL_TABLET | Freq: Every day | ORAL | 11 refills | Status: DC

## 2016-08-15 MED ORDER — FUROSEMIDE 40 MG PO TABS: 40.0000 mg | ORAL_TABLET | Freq: Every day | ORAL | 11 refills | Status: DC

## 2016-08-15 MED ORDER — METOPROLOL SUCCINATE ER 25 MG PO TB24: 12.5000 mg | ORAL_TABLET | Freq: Every day | ORAL | 11 refills | Status: DC

## 2016-08-15 MED ORDER — LOSARTAN POTASSIUM 100 MG PO TABS: 100.0000 mg | ORAL_TABLET | Freq: Every day | ORAL | 11 refills | Status: DC

## 2016-08-15 NOTE — Progress Notes (Signed)
CARDIOLOGY OFFICE NOTE  Date:  08/15/2016    Bobby Lindsey Date of Birth: 07-Nov-1955 Medical Record #[id number]  PCP:  Adin Hector, MD  Cardiologist:  Hochrein/Pender  Chief Complaint  Patient presents with  . Coronary Artery Disease    History of Present Illness: Bobby Lindsey is a 61 y.o. male who presents today for a follow up visit. Seen for Dr. Percival Spanish and [name], PA.   He has a hx of CAD s/p CABG 2002 and prior DES to RCA, recurrent pulmonary embolism/DVT, HTN, HL, DM, OSA.   He was admitted to [facility] 03/2013 with an Inferior STEMI. Emergent LHC demonstrated patent S-OM1 and L-LAD, occluded S-Dx and S-PDA. EF was 40% with inf wall motion abnormality. Echo demonstrated normal LVF prior to d/c. Culprit for STEMI was an occluded native CFX which was treated with DES. He has a hx of elevated CK and has remained off of statin Rx.  Hospitalization after MI in 2015 was c/b AFib and a/c diastolic CHF. He was started on Amiodarone. He developed Mobitz II and beta blocker was d/c'd. He was d/c in NSR. He stopped the Amiodarone on his own as he thought it was causing dyspnea.   Echo in 12/15 demonstrated normal LVF and mild pulmonary HTN.   When last seen by Dr. Minus Breeding in 9/16, Plavix was DC'd.   Last seen in clinic by Vina last year. Still with lots of myalgias.  He had decided to not start Zetia.  Cardiac status felt to be stable.     Comes in today. Here with his wife today.  Diabetes is "my biggest problem". He is followed at [facility] for this. No chest pain. Short of breath with exertion - worse with incline - this is chronic and does not seem to be worse. Still struggling with his weight. No regular exercise. He works in [employer]. Missed his last coumadin check in [place] - wanting to get checked here today. Not wearing his CPAP - can't tolerate what sounds like nasal prongs.  Wife trying to be encouraging to get him to wear and  asking if there are any other options. He feels like overall he is doing ok.   Past Medical History:  Diagnosis Date  . Atrial fibrillation ([place])    a. Transient during 03/2013 admission.  . Bradycardia    a. Bradycardia/pauses/possible Mobitz II during 03/2013 adm. Not on BB due to this.  Marland Kitchen CAD (coronary artery disease)    a. s/p CABG 2002. b. Hx Cypher stent to the RCA. c. Inf-lat STEMI 03/2013:  LHC (04/05/13):  mLAD occluded, pD1 90, apical br of Dx occluded, CFX occluded, pOM1 90-95, RCA stents patent, diff RCA 30, S-Dx occluded, S-PDA occluded, S-OM1 40-50, L-LAD patent, EF 40% with inf HK.  PCI:  Promus (2.5 x 28) DES to mid to dist CFX.  Marland Kitchen Charcot's joint of knee    UNSPECIFIED AREA  . COPD (chronic obstructive pulmonary disease) ([place])   . Deep venous thrombosis (HCC)    right lower extremity  . Diabetes mellitus    a. A1C 10.7 in 03/2013.  . Diastolic CHF ([place])    a. EF 40% by cath, 55-60% during 03/2013 adm, required IV diuresis.  . DVT, lower extremity, recurrent ([place])    a. Hx recurrent DVT per record.  . Dyslipidemia   . Elevated CK    a. Pt has refused rheum workup in the past.  . GERD (gastroesophageal  reflux disease)   . HTN (hypertension)    x 15 years  . Hx of cardiovascular stress test    a. Lexiscan Myoview (03/2010):  diaph atten vs inf scar, no ischemia, EF 47%; Low Risk.  Marland Kitchen Hx of echocardiogram    a. Echo (04/08/13):  Mild LVH, EF 55-60%, restrictive physiology, severe LAE, mild reduced RVSF, mild RAE.  . Leg pain    ABI 6/16:  R 1.2, L 1.1 - normal  . Obesity   . Peripheral neuropathy   . Pulmonary emboli ([place])    a. Hx PE per record.  . Pulmonary embolism ([place])   . Sleep apnea     Past Surgical History:  Procedure Laterality Date  . CORONARY ANGIOPLASTY WITH STENT PLACEMENT    . CORONARY ARTERY BYPASS GRAFT     4 time since 2002  . LEFT HEART CATHETERIZATION WITH CORONARY/GRAFT ANGIOGRAM  04/05/2013   Procedure: LEFT HEART CATHETERIZATION WITH  Beatrix Fetters;  Surgeon: Peter M Martinique, MD;  Location: [facility] CATH LAB;  Service: Cardiovascular;;  . left knee surgery    . PERCUTANEOUS CORONARY STENT INTERVENTION (PCI-S)  04/05/2013   Procedure: PERCUTANEOUS CORONARY STENT INTERVENTION (PCI-S);  Surgeon: Peter M Martinique, MD;  Location: [facility] CATH LAB;  Service: Cardiovascular;;  DES to native Mid cx     Medications: Current Meds  Medication Sig  . acetaminophen (TYLENOL) 500 MG tablet Take 1,500 mg by mouth every 6 (six) hours as needed for mild pain.  . furosemide (LASIX) 40 MG tablet Take 1 tablet (40 mg total) by mouth daily.  Marland Kitchen gabapentin (NEURONTIN) 300 MG capsule Take 300 mg by mouth as needed (pain in foot and ankle).  . Insulin Degludec (TRESIBA FLEXTOUCH) 200 UNIT/ML SOPN Inject 80 Units into the skin every evening.  . insulin lispro (HUMALOG KWIKPEN) 100 UNIT/ML injection Inject 20 Units into the skin 3 (three) times daily before meals. UAD  . isosorbide mononitrate (IMDUR) 30 MG 24 hr tablet Take 1 tablet (30 mg total) by mouth daily.  . Liraglutide (VICTOZA) 18 MG/3ML SOPN Inject 0.8 mg into the skin every 3 (three) days.   Marland Kitchen losartan (COZAAR) 100 MG tablet Take 1 tablet (100 mg total) by mouth daily.  . metoprolol succinate (TOPROL-XL) 25 MG 24 hr tablet Take 0.5 tablets (12.5 mg total) by mouth daily.  . nitroGLYCERIN (NITROSTAT) 0.4 MG SL tablet Place 1 tablet (0.4 mg total) under the tongue every 5 (five) minutes as needed for chest pain. NEEDS APPOINTMENT  . pantoprazole (PROTONIX) 40 MG tablet Take 1 tablet (40 mg total) by mouth as needed (indigestion).  . warfarin (COUMADIN) 5 MG tablet TAKE AS DIRECTED BY COUMADIN CLINIC  . [DISCONTINUED] furosemide (LASIX) 40 MG tablet Take 1 tablet (40 mg total) by mouth daily.  . [DISCONTINUED] isosorbide mononitrate (IMDUR) 30 MG 24 hr tablet Take 1 tablet (30 mg total) by mouth daily.  . [DISCONTINUED] losartan (COZAAR) 100 MG tablet Take 1 tablet (100 mg total) by mouth  daily.  . [DISCONTINUED] metoprolol succinate (TOPROL-XL) 25 MG 24 hr tablet Take 0.5 tablets (12.5 mg total) by mouth daily.  . [DISCONTINUED] nitroGLYCERIN (NITROSTAT) 0.4 MG SL tablet Place 1 tablet (0.4 mg total) under the tongue every 5 (five) minutes as needed for chest pain. NEEDS APPOINTMENT  . [DISCONTINUED] pantoprazole (PROTONIX) 40 MG tablet Take 1 tablet (40 mg total) by mouth as needed (indigestion).     Allergies: Allergies  Allergen Reactions  . Simvastatin Other (See Comments)  fatigue    Social History: The patient  reports that he has never smoked. He has never used smokeless tobacco. He reports that he drinks alcohol. He reports that he does not use drugs.   Family History: The patient's family history includes Cancer in his father; Heart disease in his mother; Hypertension in his mother and sister.   Review of Systems: Please see the history of present illness.   Otherwise, the review of systems is positive for none.   All other systems are reviewed and negative.   Physical Exam: VS:  BP 130/70 (BP Location: Left Arm, Patient Position: Sitting, Cuff Size: Large)   Pulse 62   Ht 6' (1.829 m)   Wt 274 lb 1.9 oz (124.3 kg)   SpO2 99% Comment: at rest  BMI 37.18 kg/m  .  BMI Body mass index is 37.18 kg/m.  Wt Readings from Last 3 Encounters:  08/15/16 274 lb 1.9 oz (124.3 kg)  07/06/15 273 lb 3.2 oz (123.9 kg)  06/17/15 273 lb (123.8 kg)    General: Obese male who is alert and in no acute distress. Little gruff.  HEENT: Normal.  Neck: Supple, no JVD, carotid bruits, or masses noted.  Cardiac: Regular rate and rhythm. Heart tones are distant. No edema but legs are full.  Respiratory:  Lungs are clear to auscultation bilaterally with normal work of breathing.  GI: Soft and nontender.  MS: No deformity or atrophy. Gait and ROM intact.  Skin: Warm and dry. Color is normal.  Neuro:  Strength and sensation are intact and no gross focal deficits noted.    Psych: Alert, appropriate and with normal affect.   LABORATORY DATA:  EKG:  EKG is not ordered today.   Lab Results  Component Value Date   WBC 8.2 11/15/2015   HGB 17.0 11/15/2015   HCT 50.0 11/15/2015   PLT 181 11/15/2015   GLUCOSE 285 (H) 11/15/2015   CHOL 159 03/31/2015   TRIG 168 (H) 03/31/2015   HDL 33 (L) 03/31/2015   LDLCALC 92 03/31/2015   ALT 23 11/15/2015   AST 20 11/15/2015   NA 138 11/15/2015   K 4.4 11/15/2015   CL 95 (L) 11/15/2015   CREATININE 1.40 (H) 11/15/2015   BUN 26 (H) 11/15/2015   CO2 30 11/15/2015   TSH 2.080 03/31/2015   INR 3.2 07/19/2016   HGBA1C 10.4 (H) 03/31/2015     BNP (last 3 results) No results for input(s): BNP in the last 8760 hours.  ProBNP (last 3 results) No results for input(s): PROBNP in the last 8760 hours.   Other Studies Reviewed Today:  Echo Study Conclusions from 01/2014  - Left ventricle: There was moderate concentric hypertrophy. Systolic function was normal. The estimated ejection fraction was in the range of 55% to 60%. - Aortic valve: Mild thickening and calcification, consistent with sclerosis. There was trivial regurgitation. - Mitral valve: Calcified annulus. - Left atrium: The atrium was mildly dilated. - Pulmonary arteries: PA peak pressure: 36 mm Hg (S).  Impressions:  - The right ventricular systolic pressure was increased consistent with mild pulmonary hypertension.   Assessment/Plan: 1.CAD - s/p CABG. He is s/p DES to the CFX in 03/2013. LHC in 03/2013 demonstrated an occluded S-Dx with 90% stenosis in the D1 and an occluded apical branch of the Dx. He is now off of Plavix and remains on Coumadin. No angina. Stable DOE.  Continue nitrates, Toprol-XL, Losartan. He has been kept off of statin Rx due to  CK elevation in the past.  His medicines are refilled today.   2.Paroxysmal Atrial Fibrillation: Maintaining NSR. He needs to remain on Coumadin long-term due to prior hx of PE.   Will try to get his INR checked here today.   3.Chronic Diastolic CHF:  Volume stable.  Continue current Rx.He can take extra Lasix if weight or edema increases.   4. HTN: Controlled.    5. Hyperlipidemia: not on any lipid lowering medicines - not interested in taking at this time. Lab today.   6.Diabetes - Uncontrolled.    7. OSA - He cannot tolerate his current CPAP. Will see if Dr. Radford Pax and her staff has any other options.   Overall, he seems to not really be interested in self help measures. Discussed at length. Overall prognosis tenuous to me.    Current medicines are reviewed with the patient today.  The patient does not have concerns regarding medicines other than what has been noted above.  The following changes have been made:  See above.  Labs/ tests ordered today include:    Orders Placed This Encounter  Procedures  . Basic metabolic panel  . CBC  . Hepatic function panel  . Lipid panel  . TSH     Disposition:   FU with [name], PA or Dr. Percival Spanish in 6 months.    Patient is agreeable to this plan and will call if any problems develop in the interim.   SignedTruitt Merle, NP  08/15/2016 2:50 PM  [facility] [address] [place], [place]  [address] Phone: [phone] Fax: [phone]

## 2016-08-15 NOTE — Patient Instructions (Addendum)
We will be checking the following labs today -  BMET, CBC, HPF, lipids and TSH  We will see if you can get your Coumadin checked here today  Medication Instructions:    Continue with your current medicines.   I have sent in your refills today     Testing/Procedures To Be Arranged:  N/A  Follow-Up:   See [name], PA in 6 months  I have sent a message to Dr. Theodosia Blender staff regarding your CPAP     Other Special Instructions:   N/A    If you need a refill on your cardiac medications before your next appointment, please call your pharmacy.   Call the [place] office at [phone] if you have any questions, problems or concerns.

## 2016-08-16 LAB — LIPID PANEL
Chol/HDL Ratio: 4 ratio (ref 0.0–5.0)
Cholesterol, Total: 125 mg/dL (ref 100–199)
HDL: 31 mg/dL — ABNORMAL LOW (ref >39)
LDL Calculated: 21 mg/dL (ref 0–99)
Triglycerides: 365 mg/dL — ABNORMAL HIGH (ref 0–149)
VLDL Cholesterol Cal: 73 mg/dL — ABNORMAL HIGH (ref 5–40)

## 2016-08-16 LAB — CBC
Hematocrit: 45.3 % (ref 37.5–51.0)
Hemoglobin: 15.1 g/dL (ref 13.0–17.7)
MCH: 29.2 pg (ref 26.6–33.0)
MCHC: 33.3 g/dL (ref 31.5–35.7)
MCV: 88 fL (ref 79–97)
Platelets: 163 10*3/uL (ref 150–379)
RBC: 5.17 x10E6/uL (ref 4.14–5.80)
RDW: 14.3 % (ref 12.3–15.4)
WBC: 7.7 10*3/uL (ref 3.4–10.8)

## 2016-08-16 LAB — TSH: TSH: 2.12 u[IU]/mL (ref 0.450–4.500)

## 2016-08-16 LAB — BASIC METABOLIC PANEL
BUN/Creatinine Ratio: 24 (ref 10–24)
BUN: 27 mg/dL (ref 8–27)
CO2: 23 mmol/L (ref 20–29)
Calcium: 9 mg/dL (ref 8.6–10.2)
Chloride: 101 mmol/L (ref 96–106)
Creatinine, Ser: 1.12 mg/dL (ref 0.76–1.27)
GFR calc Af Amer: 82 mL/min/{1.73_m2} (ref >59)
GFR calc non Af Amer: 71 mL/min/{1.73_m2} (ref >59)
Glucose: 319 mg/dL — ABNORMAL HIGH (ref 65–99)
Potassium: 5.7 mmol/L — ABNORMAL HIGH (ref 3.5–5.2)
Sodium: 137 mmol/L (ref 134–144)

## 2016-08-16 LAB — HEPATIC FUNCTION PANEL
ALT: 28 IU/L (ref 0–44)
AST: 25 IU/L (ref 0–40)
Albumin: 3.9 g/dL (ref 3.6–4.8)
Alkaline Phosphatase: 65 IU/L (ref 39–117)
Bilirubin Total: 0.3 mg/dL (ref 0.0–1.2)
Bilirubin, Direct: 0.11 mg/dL (ref 0.00–0.40)
Total Protein: 6.6 g/dL (ref 6.0–8.5)

## 2016-08-22 ENCOUNTER — Telehealth: Payer: Self-pay | Admitting: *Deleted

## 2016-08-22 DIAGNOSIS — G4733 Obstructive sleep apnea (adult) (pediatric): Secondary | ICD-10-CM

## 2016-08-22 NOTE — Telephone Encounter (Signed)
-----   Message from Sueanne Margarita, MD sent at 08/22/2016 12:26 PM EDT ----- Please call patient and find out what problems specifically he is having with his CPAP  Traci ----- Message ----- From: Freada Bergeron, CMA Sent: 08/22/2016  11:38 AM To: Sueanne Margarita, MD    ----- Message ----- From: Burtis Junes, NP Sent: 08/15/2016   2:43 PM To: Sueanne Margarita, MD, Freada Bergeron, CMA, #  I do not know that status of his CPAP - he is using nasal prongs and not tolerating - can we talk with Dr. Radford Pax and see if she has any other options for him.   Still with considerable daytime sleepiness.  lori

## 2016-08-22 NOTE — Telephone Encounter (Signed)
LMTCB on cell

## 2016-08-30 ENCOUNTER — Ambulatory Visit (INDEPENDENT_AMBULATORY_CARE_PROVIDER_SITE_OTHER): Payer: Managed Care, Other (non HMO) | Admitting: *Deleted

## 2016-08-30 DIAGNOSIS — I4891 Unspecified atrial fibrillation: Secondary | ICD-10-CM

## 2016-08-30 DIAGNOSIS — Z5181 Encounter for therapeutic drug level monitoring: Secondary | ICD-10-CM

## 2016-08-30 DIAGNOSIS — I2119 ST elevation (STEMI) myocardial infarction involving other coronary artery of inferior wall: Secondary | ICD-10-CM

## 2016-08-30 DIAGNOSIS — I82409 Acute embolism and thrombosis of unspecified deep veins of unspecified lower extremity: Secondary | ICD-10-CM | POA: Diagnosis not present

## 2016-08-30 LAB — POCT INR: INR: 2

## 2016-08-30 NOTE — Telephone Encounter (Signed)
Wife (Amy) states the patient does have a cpap that is 30 or 62 years old. Amy (wife) states the patient feels clastrophobic when he puts on his mask at night so he will not wear it. Patient feels like he can't breathe with something covering his face. Wife (Amy) states patient fights in his sleep, gasps for breaths in sleep, chokes, he is tired in the mornings, falls asleep at stop lights and pulls over on side of road to catch a nap sometimes.

## 2016-08-30 NOTE — Telephone Encounter (Signed)
-----   Message from Sueanne Margarita, MD sent at 08/22/2016 12:26 PM EDT ----- Please call patient and find out what problems specifically he is having with his CPAP  Traci ----- Message ----- From: Freada Bergeron, CMA Sent: 08/22/2016  11:38 AM To: Sueanne Margarita, MD    ----- Message ----- From: Burtis Junes, NP Sent: 08/15/2016   2:43 PM To: Sueanne Margarita, MD, Freada Bergeron, CMA, #  I do not know that status of his CPAP - he is using nasal prongs and not tolerating - can we talk with Dr. Radford Pax and see if she has any other options for him.   Still with considerable daytime sleepiness.  lori

## 2016-08-31 ENCOUNTER — Telehealth: Payer: Self-pay | Admitting: Nurse Practitioner

## 2016-08-31 NOTE — Telephone Encounter (Signed)
I spoke with pt's wife and reviewed lab results with her. She does not think pt would be agreeable to lipid clinic appointment at this time as he is seeing endocrinology frequently.She will call us back to schedule appointment if pt would like to proceed with this.   Pt is not taking potassium and will limit potassium in diet.  I gave wife foods high in potassium.   Pt is seeing endocrinology at [facility] again in 3 weeks and will have BMP done there.  I gave wife office fax number so these results can be faxed to our office.  See phone note dated 08/22/16 regarding CPAP issues.

## 2016-08-31 NOTE — Telephone Encounter (Signed)
Follow Up:   Returning Danielle call from 08-29-16,concerning pt's lab results.

## 2016-09-07 NOTE — Telephone Encounter (Signed)
Patient states he just wants to try several different mask options to get best one for him. One that does not cover his whole face and makes him feel like he is smothering. Reached out to [facility] Apolonio Schneiders) and she said the patient needs to call and make an appointment to have a mask fitting. Reached out to the patient and told him to call [facility] to make appointment for a mask fitiing and he agreed to call on Friday 09/08/16.

## 2016-09-07 NOTE — Telephone Encounter (Signed)
-----   Message from Sueanne Margarita, MD sent at 08/30/2016  5:04 PM EDT ----- He needs a 2 week mask desensitization with [facility] and then repeat CPAP titration in lab  Traci ----- Message ----- From: Freada Bergeron, CMA Sent: 08/30/2016   4:38 PM To: Sueanne Margarita, MD  Wife (Amy) states the patient does have a cpap that is 28 or 62 years old. Amy (wife) states the patient feels clastrophobic when he puts on his mask at night so he will not wear it. Patient feels like he can't breathe with something covering his face. Wife (Amy) states patient fights in his sleep, gasps for breaths in sleep, chokes, he is tired in the mornings, falls asleep at stop lights and pulls over on side of road to catch a nap sometimes.  ----- Message ----- From: Sueanne Margarita, MD Sent: 08/22/2016  12:26 PM To: Freada Bergeron, CMA  Please call patient and find out what problems specifically he is having with his CPAP  Traci ----- Message ----- From: Freada Bergeron, CMA Sent: 08/22/2016  11:38 AM To: Sueanne Margarita, MD    ----- Message ----- From: Burtis Junes, NP Sent: 08/15/2016   2:43 PM To: Sueanne Margarita, MD, Freada Bergeron, CMA, #  I do not know that status of his CPAP - he is using nasal prongs and not tolerating - can we talk with Dr. Radford Pax and see if she has any other options for him.   Still with considerable daytime sleepiness.  lori

## 2016-09-12 NOTE — Telephone Encounter (Signed)
What mask does he want to try

## 2016-09-12 NOTE — Telephone Encounter (Signed)
Reached out to [facility] Apolonio Schneiders) and she said the patient needs to call and make an appointment to have a mask fitting. Reached out to the patient and told him to call [facility] to make appointment for a mask fitiing and he agreed to call on Friday Patient called on Friday [facility] said he needed a doctors order to get a mask fitting because he had not been into their office since 2015.  Please advise how to proceed.

## 2016-09-24 ENCOUNTER — Emergency Department (HOSPITAL_COMMUNITY): Payer: Managed Care, Other (non HMO)

## 2016-09-24 ENCOUNTER — Emergency Department (HOSPITAL_COMMUNITY)
Admission: EM | Admit: 2016-09-24 | Discharge: 2016-09-25 | Disposition: A | Discharge status: Home / Self Care | Payer: Managed Care, Other (non HMO) | Attending: Emergency Medicine | Admitting: Emergency Medicine

## 2016-09-24 ENCOUNTER — Encounter (HOSPITAL_COMMUNITY): Payer: Self-pay | Admitting: Emergency Medicine

## 2016-09-24 DIAGNOSIS — Z7901 Long term (current) use of anticoagulants: Secondary | ICD-10-CM | POA: Diagnosis not present

## 2016-09-24 DIAGNOSIS — E119 Type 2 diabetes mellitus without complications: Secondary | ICD-10-CM | POA: Insufficient documentation

## 2016-09-24 DIAGNOSIS — I2510 Atherosclerotic heart disease of native coronary artery without angina pectoris: Secondary | ICD-10-CM | POA: Diagnosis not present

## 2016-09-24 DIAGNOSIS — N289 Disorder of kidney and ureter, unspecified: Secondary | ICD-10-CM | POA: Diagnosis not present

## 2016-09-24 DIAGNOSIS — Z79899 Other long term (current) drug therapy: Secondary | ICD-10-CM | POA: Diagnosis not present

## 2016-09-24 DIAGNOSIS — R0602 Shortness of breath: Secondary | ICD-10-CM | POA: Diagnosis present

## 2016-09-24 DIAGNOSIS — J449 Chronic obstructive pulmonary disease, unspecified: Secondary | ICD-10-CM | POA: Diagnosis not present

## 2016-09-24 DIAGNOSIS — I4891 Unspecified atrial fibrillation: Secondary | ICD-10-CM | POA: Diagnosis not present

## 2016-09-24 DIAGNOSIS — I10 Essential (primary) hypertension: Secondary | ICD-10-CM | POA: Diagnosis not present

## 2016-09-24 DIAGNOSIS — I509 Heart failure, unspecified: Secondary | ICD-10-CM | POA: Insufficient documentation

## 2016-09-24 LAB — BASIC METABOLIC PANEL
Anion gap: 9 (ref 5–15)
BUN: 30 mg/dL — ABNORMAL HIGH (ref 6–20)
CO2: 25 mmol/L (ref 22–32)
Calcium: 8.9 mg/dL (ref 8.9–10.3)
Chloride: 105 mmol/L (ref 101–111)
Creatinine, Ser: 1.31 mg/dL — ABNORMAL HIGH (ref 0.61–1.24)
GFR calc Af Amer: >60 mL/min (ref >60)
GFR calc non Af Amer: 57 mL/min — ABNORMAL LOW (ref >60)
Glucose, Bld: 118 mg/dL — ABNORMAL HIGH (ref 65–99)
Potassium: 4.6 mmol/L (ref 3.5–5.1)
Sodium: 139 mmol/L (ref 135–145)

## 2016-09-24 LAB — CBC
HCT: 42.1 % (ref 39.0–52.0)
Hemoglobin: 14.1 g/dL (ref 13.0–17.0)
MCH: 28.8 pg (ref 26.0–34.0)
MCHC: 33.5 g/dL (ref 30.0–36.0)
MCV: 86.1 fL (ref 78.0–100.0)
Platelets: 175 10*3/uL (ref 150–400)
RBC: 4.89 MIL/uL (ref 4.22–5.81)
RDW: 14.3 % (ref 11.5–15.5)
WBC: 9.4 10*3/uL (ref 4.0–10.5)

## 2016-09-24 LAB — I-STAT TROPONIN, ED: Troponin i, poc: 0.08 ng/mL (ref 0.00–0.08)

## 2016-09-24 LAB — BRAIN NATRIURETIC PEPTIDE: B Natriuretic Peptide: 120.6 pg/mL — ABNORMAL HIGH (ref 0.0–100.0)

## 2016-09-24 MED ORDER — FUROSEMIDE 10 MG/ML IJ SOLN
80.0000 mg | Freq: Once | INTRAMUSCULAR | Status: AC
  Administered 2016-09-24: 80 mg via INTRAVENOUS

## 2016-09-24 MED ORDER — FUROSEMIDE 10 MG/ML IJ SOLN
80.0000 mg | Freq: Once | INTRAMUSCULAR | Status: DC
  Filled 2016-09-24: qty 8

## 2016-09-24 NOTE — ED Provider Notes (Signed)
[facility] DEPT Provider Note   CSN: [id number] Arrival date & time: 09/24/16  1940     History   Chief Complaint Chief Complaint  Patient presents with  . Shortness of Breath    HPI Bobby Lindsey is a 61 y.o. male.  The history is provided by the patient.  Shortness of Breath   He has been noticing increasing shortness of breath over the last 2 weeks. Dyspnea is worse with exertion and with laying flat. He has been sleeping in a recliner. He does have a history of sleep apnea, but cannot tolerate his CPAP mask, so he is not wearing it. He has noticed some leg swelling, and a 10 pound weight gain over the last 2 weeks. He denies chest pain, cough, fever. He does take furosemide, and on 2 occasions, he has taken a second dose to try to get some of the fluid off.  Past Medical History:  Diagnosis Date  . Atrial fibrillation ([place])    a. Transient during 03/2013 admission.  . Bradycardia    a. Bradycardia/pauses/possible Mobitz II during 03/2013 adm. Not on BB due to this.  Marland Kitchen CAD (coronary artery disease)    a. s/p CABG 2002. b. Hx Cypher stent to the RCA. c. Inf-lat STEMI 03/2013:  LHC (04/05/13):  mLAD occluded, pD1 90, apical br of Dx occluded, CFX occluded, pOM1 90-95, RCA stents patent, diff RCA 30, S-Dx occluded, S-PDA occluded, S-OM1 40-50, L-LAD patent, EF 40% with inf HK.  PCI:  Promus (2.5 x 28) DES to mid to dist CFX.  Marland Kitchen Charcot's joint of knee    UNSPECIFIED AREA  . COPD (chronic obstructive pulmonary disease) ([place])   . Deep venous thrombosis (HCC)    right lower extremity  . Diabetes mellitus    a. A1C 10.7 in 03/2013.  . Diastolic CHF ([place])    a. EF 40% by cath, 55-60% during 03/2013 adm, required IV diuresis.  . DVT, lower extremity, recurrent ([place])    a. Hx recurrent DVT per record.  . Dyslipidemia   . Elevated CK    a. Pt has refused rheum workup in the past.  . GERD (gastroesophageal reflux disease)   . HTN (hypertension)    x 15 years  . Hx of  cardiovascular stress test    a. Lexiscan Myoview (03/2010):  diaph atten vs inf scar, no ischemia, EF 47%; Low Risk.  Marland Kitchen Hx of echocardiogram    a. Echo (04/08/13):  Mild LVH, EF 55-60%, restrictive physiology, severe LAE, mild reduced RVSF, mild RAE.  . Leg pain    ABI 6/16:  R 1.2, L 1.1 - normal  . Obesity   . Peripheral neuropathy   . Pulmonary emboli ([place])    a. Hx PE per record.  . Pulmonary embolism ([place])   . Sleep apnea     Patient Active Problem List   Diagnosis Date Noted  . DDD (degenerative disc disease), lumbar 03/27/2016  . History of recurrent deep vein thrombosis (DVT) 03/26/2016  . History of pulmonary embolism 10/01/2015  . 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 Cellulitis of elbow 01/15/2015  . Septic olecranon bursitis 01/15/2015  . Tabes dorsalis 12/16/2013  . Persistent cough 12/16/2013  . Encounter for therapeutic drug monitoring 04/14/2013  . Other pulmonary embolism and infarction 04/14/2013  . History of recurrent DVT/E 04/11/2013  . Obstructive sleep apnea 04/11/2013  . Bradycardia 04/11/2013  . History of Elevated CK level  04/11/2013  .  Obesity 04/11/2013  . Abnormal levels of other serum enzymes 04/11/2013  . Acute thromboembolism of deep veins of lower extremity ([place]) 04/11/2013  . Peripheral neuropathy ([place])   . Charcot's arthropathy   . Chronic obstructive pulmonary disease ([place])   . Chronic diastolic heart failure ([place]) 04/08/2013  . Atrial fibrillation ([place]) 04/07/2013  . Acute myocardial infarction of inferolateral wall (HCC) 04/05/2013  . Abnormal glucose measurement 07/18/2011  . Type 2 diabetes mellitus ([place]) 07/18/2011  . Reactive depression (sit[place]) 05/16/2011  . Long term (current) use of anticoagulants 09/21/2010  . Muscle pain 09/06/2010  . 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 ED (erectile dysfunction) of organic origin 07/28/2010  . HYPERKALEMIA 07/20/2009  .  Uncontrolled type 2 diabetes mellitus with insulin therapy ([place]) 05/26/2008  . Hyperlipidemia 05/26/2008  . Hypertension 05/26/2008  . CAD (coronary artery disease) 05/26/2008    Past Surgical History:  Procedure Laterality Date  . CORONARY ANGIOPLASTY WITH STENT PLACEMENT    . CORONARY ARTERY BYPASS GRAFT     4 time since 2002  . LEFT HEART CATHETERIZATION WITH CORONARY/GRAFT ANGIOGRAM  04/05/2013   Procedure: LEFT HEART CATHETERIZATION WITH Beatrix Fetters;  Surgeon: Peter M Martinique, MD;  Location: [facility] CATH LAB;  Service: Cardiovascular;;  . left knee surgery    . PERCUTANEOUS CORONARY STENT INTERVENTION (PCI-S)  04/05/2013   Procedure: PERCUTANEOUS CORONARY STENT INTERVENTION (PCI-S);  Surgeon: Peter M Martinique, MD;  Location: [facility] CATH LAB;  Service: Cardiovascular;;  DES to native Mid cx       Home Medications    Prior to Admission medications   Medication Sig Start Date End Date Taking? Authorizing Provider  acetaminophen (TYLENOL) 500 MG tablet Take 1,500 mg by mouth every 6 (six) hours as needed for mild pain.    [provider]  furosemide (LASIX) 40 MG tablet Take 1 tablet (40 mg total) by mouth daily. 08/15/16   Burtis Junes, NP  gabapentin (NEURONTIN) 100 MG capsule Take 300 mg by mouth 2 (two) times daily.  06/15/14 11/16/15  [provider]  gabapentin (NEURONTIN) 300 MG capsule Take 300 mg by mouth as needed (pain in foot and ankle).    [provider]  Insulin Degludec (TRESIBA FLEXTOUCH) 200 UNIT/ML SOPN Inject 80 Units into the skin every evening.    [provider]  insulin lispro (HUMALOG KWIKPEN) 100 UNIT/ML injection Inject 20 Units into the skin 3 (three) times daily before meals. UAD    [provider]  isosorbide mononitrate (IMDUR) 30 MG 24 hr tablet Take 1 tablet (30 mg total) by mouth daily. 08/15/16   Burtis Junes, NP  Liraglutide (VICTOZA) 18 MG/3ML SOPN Inject 0.8 mg into the skin every 3 (three) days.   06/28/15   [provider]  losartan (COZAAR) 100 MG tablet Take 1 tablet (100 mg total) by mouth daily. 08/15/16   Burtis Junes, NP  metoprolol succinate (TOPROL-XL) 25 MG 24 hr tablet Take 0.5 tablets (12.5 mg total) by mouth daily. 08/15/16   Burtis Junes, NP  nitroGLYCERIN (NITROSTAT) 0.4 MG SL tablet Place 1 tablet (0.4 mg total) under the tongue every 5 (five) minutes as needed for chest pain. NEEDS APPOINTMENT 08/15/16   Burtis Junes, NP  pantoprazole (PROTONIX) 40 MG tablet Take 1 tablet (40 mg total) by mouth as needed (indigestion). 08/15/16   Burtis Junes, NP  warfarin (COUMADIN) 5 MG tablet TAKE AS DIRECTED BY COUMADIN CLINIC 07/19/16   Minus Breeding, MD    Family  History Family History  Problem Relation Age of Onset  . Heart disease Mother   . Hypertension Mother   . Cancer Father   . Hypertension Sister   . Heart attack Neg Hx   . Stroke Neg Hx     Social History Social History  Substance Use Topics  . Smoking status: Never Smoker  . Smokeless tobacco: Never Used  . Alcohol use Yes     Comment: rare     Allergies   Simvastatin   Review of Systems Review of Systems  Respiratory: Positive for shortness of breath.   All other systems reviewed and are negative.    Physical Exam Updated Vital Signs BP (!) 132/97 (BP Location: Left Arm)   Pulse 71   Temp (!) 97.4 F (36.3 C) (Oral)   Resp (!) 22   SpO2 95%   Physical Exam  Nursing note and vitals reviewed.  61 year old male, resting comfortably and in no acute distress. Vital signs are significant for hypertension and tachypnea. Oxygen saturation is 95%, which is normal. Head is normocephalic and atraumatic. PERRLA, EOMI. Oropharynx is clear. Neck is nontender and supple without adenopathy or JVD. Back is nontender and there is no CVA tenderness. Lungs are clear without rales, wheezes, or rhonchi. Chest is nontender. Heart has regular rate and rhythm without murmur. Abdomen is soft,  flat, nontender without masses or hepatosplenomegaly and peristalsis is normoactive. Umbilical hernia is present, easily reducible. Extremities have 2-3+ edema with moderate venous stasis changes, full range of motion is present. There is also 2+ presacral edema. Skin is warm and dry without rash. Neurologic: Mental status is normal, cranial nerves are intact, there are no motor or sensory deficits.  ED Treatments / Results  Labs (all labs ordered are listed, but only abnormal results are displayed) Labs Reviewed  BASIC METABOLIC PANEL - Abnormal; Notable for the following:       Result Value   Glucose, Bld 118 (*)    BUN 30 (*)    Creatinine, Ser 1.31 (*)    GFR calc non Af Amer 57 (*)    All other components within normal limits  BRAIN NATRIURETIC PEPTIDE - Abnormal; Notable for the following:    B Natriuretic Peptide 120.6 (*)    All other components within normal limits  PROTIME-INR - Abnormal; Notable for the following:    Prothrombin Time 33.1 (*)    All other components within normal limits  CBC  I-STAT TROPONIN, ED    EKG   EKG Interpretation  Date/Time:  Sunday September 24 2016 19:43:27 EDT Ventricular Rate:  64 PR Interval:  198 QRS Duration: 90 QT Interval:  434 QTC Calculation: 447 R Axis:   73 Text Interpretation:  Normal sinus rhythm Cannot rule out Anterior infarct , age undetermined Abnormal ECG When compared with ECG of 10/2/217, No significant change was found Confirmed by Delora Fuel ([id number]) on 09/24/2016 11:13:39 PM       Radiology Dg Chest 2 View  Result Date: 09/24/2016 CLINICAL DATA:  Acute onset of generalized chest tightness and shortness of breath. Initial encounter. EXAM: CHEST  2 VIEW COMPARISON:  Chest radiograph performed 03/04/2015 FINDINGS: The lungs are well-aerated. Mild vascular congestion is noted. Increased interstitial markings raise concern for mild interstitial edema. Mild left basilar airspace opacity may reflect atelectasis or  scarring. There is no evidence of pleural effusion or pneumothorax. The heart is borderline normal in size. The patient is status post median sternotomy, with evidence of  prior CABG. No acute osseous abnormalities are seen. IMPRESSION: Mild vascular congestion. Increased interstitial markings raise concern mild interstitial edema. Mild left basilar airspace opacity is stable in appearance and may reflect chronic atelectasis or scarring. Electronically Signed   By: Garald Balding M.D.   On: 09/24/2016 21:17    Procedures Procedures (including critical care time)  Medications Ordered in ED Medications  furosemide (LASIX) injection 80 mg (80 mg Intravenous Given 09/24/16 2349)     Initial Impression / Assessment and Plan / ED Course  I have reviewed the triage vital signs and the nursing notes.  Pertinent labs & imaging results that were available during my care of the patient were reviewed by me and considered in my medical decision making (see chart for details).  CHF exacerbation. Even though BNP is not significantly elevated, he has evidence of pulmonary vascular congestion on his chest x-ray, and worsening peripheral edema. Old records are reviewed, and he did have STEMI in 2015 with EF 40%, improved to 60% on last echocardiogram in December 2015. At office visit on July 3, no edema was noted. Mild renal insufficiency is present, unchanged from baseline. He is maintaining adequate oxygen saturation at rest, and I expect he should be able to go home. He is given a dose of furosemide in the ED and will be observed for adequate diuresis.  He had a reasonably good diuresis. He was inflated in the ED without significant oxygen desaturation. He is discharged with instructions to double his furosemide dose for the next 5 days. He will need to work with his PCP and cardiologist to identify his prior weight and appropriate plan to deal with episodes of fluid retention.  Final Clinical Impressions(s) /  ED Diagnoses   Final diagnoses:  Acute on chronic congestive heart failure, unspecified heart failure type ([place])  Renal insufficiency  Anticoagulated on warfarin    New Prescriptions New Prescriptions   No medications on file     Delora Fuel, MD 72/90/21 [phone]

## 2016-09-24 NOTE — ED Triage Notes (Signed)
Patient with shortness of breath and chest tightness that started yesterday.  Patient states that the shortness of breath has gotten worse since yesterday.  Patient denies any nausea or vomiting at this time.  Patient states that his legs do have some swelling in them.

## 2016-09-25 LAB — PROTIME-INR
INR: 3.15
Prothrombin Time: 33.1 seconds — ABNORMAL HIGH (ref 11.4–15.2)

## 2016-09-25 NOTE — Discharge Instructions (Signed)
Increase your furosemide (Lasix) dose to two tablets (80 mg) every morning for the next five days. Then resume taking one tablet (40 mg ) a day.   You will need to work with your primary care provider and cardiologist to find your dry weight, and develop a plan for you to adjust your furosemide dose as needed to keep the right amount of fluid in your body.

## 2016-09-25 NOTE — ED Notes (Signed)
Pt ambulated efficiently while maintaining O2 stat above 90%. Pt stated feeling some slight shortness of breath at the end of ambulation. RN notified.

## 2016-09-27 ENCOUNTER — Telehealth: Payer: Self-pay | Admitting: Cardiology

## 2016-09-27 ENCOUNTER — Other Ambulatory Visit: Payer: Self-pay | Admitting: Cardiology

## 2016-09-27 DIAGNOSIS — Z79899 Other long term (current) drug therapy: Secondary | ICD-10-CM

## 2016-09-27 NOTE — Telephone Encounter (Signed)
I would put him on with the PA/NP on a day Dr. Percival Spanish is in the office at [facility].  I do not see anything with me and then I am out of the office next week for a few days.  Dr. Percival Spanish is in the office next week.  Does anyone have availability then? Complete course of increased Lasix as directed by EDP.  Since weight coming down, he should continue to feel better. Get BMET Monday 10/02/16. [name], PA-C    09/27/2016 5:02 PM

## 2016-09-27 NOTE — Telephone Encounter (Signed)
Patient wife calling, states that patient went to ED on Sunday for difficulty breathing and fluid buildup. Patient is still having problems breathing.

## 2016-09-27 NOTE — Telephone Encounter (Signed)
Patient does not know what mask he wants but patient needs a doctor's order to be able to go to the [facility]  retail store to see what they have available for him to try on and pick one. He does not want the full face mask he already has one of those that he has not worn in 3 three years. Patient's wife (Amy) states he was just in the hospital and he really needs his cpap machine.

## 2016-09-27 NOTE — Telephone Encounter (Signed)
Spoke with wife Amy regarding patient of Dr. 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

## 2016-09-27 NOTE — Telephone Encounter (Signed)
Spoke with wife and provided PA recommendations. She voiced understanding. Patient will go to [facility] for lab work on 8/20. Staff message to scheduling pool to add patient to cancellation list for APPs at [facility] and Kathleen Argue, PA and Dr. Percival Spanish

## 2016-09-28 NOTE — Telephone Encounter (Signed)
Please order a nasal mask or nasal pillow mask with chin strap and get a download in 2 weeks after using the new mask

## 2016-10-02 ENCOUNTER — Ambulatory Visit (INDEPENDENT_AMBULATORY_CARE_PROVIDER_SITE_OTHER): Payer: Managed Care, Other (non HMO) | Admitting: Pharmacist Clinician (PhC)/ Clinical Pharmacy Specialist

## 2016-10-02 ENCOUNTER — Ambulatory Visit (INDEPENDENT_AMBULATORY_CARE_PROVIDER_SITE_OTHER): Payer: Managed Care, Other (non HMO) | Admitting: Physician Assistant

## 2016-10-02 ENCOUNTER — Other Ambulatory Visit: Payer: Managed Care, Other (non HMO)

## 2016-10-02 ENCOUNTER — Encounter: Payer: Self-pay | Admitting: Physician Assistant

## 2016-10-02 VITALS — BP 157/79 | HR 63 | Ht 72.0 in | Wt 271.6 lb

## 2016-10-02 DIAGNOSIS — I82409 Acute embolism and thrombosis of unspecified deep veins of unspecified lower extremity: Secondary | ICD-10-CM

## 2016-10-02 DIAGNOSIS — G4733 Obstructive sleep apnea (adult) (pediatric): Secondary | ICD-10-CM

## 2016-10-02 DIAGNOSIS — I10 Essential (primary) hypertension: Secondary | ICD-10-CM

## 2016-10-02 DIAGNOSIS — E785 Hyperlipidemia, unspecified: Secondary | ICD-10-CM

## 2016-10-02 DIAGNOSIS — Z794 Long term (current) use of insulin: Secondary | ICD-10-CM

## 2016-10-02 DIAGNOSIS — I25810 Atherosclerosis of coronary artery bypass graft(s) without angina pectoris: Secondary | ICD-10-CM | POA: Diagnosis not present

## 2016-10-02 DIAGNOSIS — I825Y9 Chronic embolism and thrombosis of unspecified deep veins of unspecified proximal lower extremity: Secondary | ICD-10-CM | POA: Diagnosis not present

## 2016-10-02 DIAGNOSIS — I4891 Unspecified atrial fibrillation: Secondary | ICD-10-CM

## 2016-10-02 DIAGNOSIS — I5033 Acute on chronic diastolic (congestive) heart failure: Secondary | ICD-10-CM

## 2016-10-02 DIAGNOSIS — Z5181 Encounter for therapeutic drug level monitoring: Secondary | ICD-10-CM | POA: Diagnosis not present

## 2016-10-02 DIAGNOSIS — I2119 ST elevation (STEMI) myocardial infarction involving other coronary artery of inferior wall: Secondary | ICD-10-CM

## 2016-10-02 DIAGNOSIS — Z79899 Other long term (current) drug therapy: Secondary | ICD-10-CM

## 2016-10-02 DIAGNOSIS — E119 Type 2 diabetes mellitus without complications: Secondary | ICD-10-CM

## 2016-10-02 LAB — BASIC METABOLIC PANEL
BUN/Creatinine Ratio: 27 — ABNORMAL HIGH (ref 10–24)
BUN: 31 mg/dL — ABNORMAL HIGH (ref 8–27)
CO2: 24 mmol/L (ref 20–29)
Calcium: 9.5 mg/dL (ref 8.6–10.2)
Chloride: 98 mmol/L (ref 96–106)
Creatinine, Ser: 1.15 mg/dL (ref 0.76–1.27)
GFR calc Af Amer: 79 mL/min/{1.73_m2} (ref >59)
GFR calc non Af Amer: 68 mL/min/{1.73_m2} (ref >59)
Glucose: 272 mg/dL — ABNORMAL HIGH (ref 65–99)
Potassium: 5.2 mmol/L (ref 3.5–5.2)
Sodium: 137 mmol/L (ref 134–144)

## 2016-10-02 LAB — POCT INR: INR: 3.7

## 2016-10-02 MED ORDER — ISOSORBIDE MONONITRATE ER 30 MG PO TB24: 30.0000 mg | ORAL_TABLET | Freq: Every day | ORAL | 3 refills | Status: DC

## 2016-10-02 MED ORDER — METOPROLOL SUCCINATE ER 25 MG PO TB24: 12.5000 mg | ORAL_TABLET | Freq: Every day | ORAL | 3 refills | Status: DC

## 2016-10-02 MED ORDER — FUROSEMIDE 80 MG PO TABS: 80.0000 mg | ORAL_TABLET | Freq: Every day | ORAL | 3 refills | Status: DC

## 2016-10-02 MED ORDER — LOSARTAN POTASSIUM 100 MG PO TABS: 100.0000 mg | ORAL_TABLET | Freq: Every day | ORAL | 3 refills | Status: DC

## 2016-10-02 NOTE — Patient Instructions (Signed)
Medication Instructions:   INCREASE Lasix to 80mg  TWICE DAILY x 2 DAYS ONLY THEN resume 80mg  DAILY  Labwork:   BMET today Coumadin clinic visit today  Testing/Procedures:  None scheduled  Follow-Up:  In 2 weeks with Almyra Deforest, PA or [name], [place]  We are referring you to Dr. Radford Pax for a consultation regarding your sleep apnea.  If you need a refill on your cardiac medications before your next appointment, please call your pharmacy.

## 2016-10-02 NOTE — Progress Notes (Signed)
Cardiology Office Note    Date:  10/03/2016   ID:  Bobby Lindsey, DOB [birth year], MRN [id number]  PCP:  Adin Hector, MD  Cardiologist:  Dr. Percival Spanish   Chief Complaint  Patient presents with  . Follow-up    seen for Dr. Percival Spanish    History of Present Illness:  Bobby Lindsey is a 61 y.o. male with PMH of CAD s/p CABG 2002 and DES to RCA, recurrent PE/DVT, HTN, HLD, DM and OSA. He was admitted in February 2015 with inferior STEMI and underwent emergent cardiac catheterization. This demonstrated patent SVG to OM1 and LIMA to LAD, occluded SVG to diagonal and SVG to PDA. EF 40% with inferior wall abnormality. Echo showed normal LV EF prior to discharge. Culprit was felt to be occluded native left circumflex which was treated with drug-eluting stent. Hospitalization complicated by transient atrial fibrillation and acute on chronic diastolic heart failure. He has a history of elevated CK and remain off of statin. After she was started on amiodarone for A. fib, he developed Mobitz II AV block, beta blocker was discontinued. He later self discontinued amiodarone thinking it was causing dyspnea. Plavix was discontinued in September 2016. He was last seen by Truitt Merle on 08/15/2016, 6 month follow-up was recommended.  He went to the hospital ED on 09/24/2016 after noticing worsening shortness of breath with exertion and with laying flat. He is currently not wearing CPAP. He also noted 10 pounds weight gain. Interestingly, his BNP was 120.6, despite chest x-ray showed mild vascular congestion and the increased interstitial markings raising concern for mild interstitial edema. He was treated for this CHF exacerbation, his Lasix was increased to 80 mg from 40 mg for the next 5 days.  He presents today for cardiology office visit. He continued to have 2+ lower extremity pitting edema. He has not come down to 40 mg daily of Lasix and is still taking 80 mg daily at this time. He says his shortness  of breath is noticeable when he is trying to lay down at night and also when he bent over. He has an enlarged abdomen and is quite obese. He has never truly started on CPAP therapy as he is intolerant to the facial mask. I will refer him to Dr. Radford Pax for management of obstructive sleep apnea. On physical exam, he has intermittent bilateral basilar crackle, I'm not entirely sure if this is atelectasis or pulmonary edema. I plan to obtain a basic metabolic panel today. If renal function stable, I will increase the Lasix to 80 mg twice a day before dropping down to 80 mg daily thereafter. I will reassess him in 2 weeks likely with repeat basic metabolic panel on that day. When asked about prior angina, he says he did have chest pain 2015 with the inferior STEMI. He has not experienced any recent chest discomfort. Suspicion for coronary artery disease causing his dyspnea on exertion relatively low. I think it is likely multifactorial was both volume overload, nonrestorative sleep due to obstructive sleep apnea and obesity.   Past Medical History:  Diagnosis Date  . Atrial fibrillation ([place])    a. Transient during 03/2013 admission.  . Bradycardia    a. Bradycardia/pauses/possible Mobitz II during 03/2013 adm. Not on BB due to this.  Marland Kitchen CAD (coronary artery disease)    a. s/p CABG 2002. b. Hx Cypher stent to the RCA. c. Inf-lat STEMI 03/2013:  LHC (04/05/13):  mLAD occluded, pD1 90, apical br of Dx  occluded, CFX occluded, pOM1 90-95, RCA stents patent, diff RCA 30, S-Dx occluded, S-PDA occluded, S-OM1 40-50, L-LAD patent, EF 40% with inf HK.  PCI:  Promus (2.5 x 28) DES to mid to dist CFX.  Marland Kitchen Charcot's joint of knee    UNSPECIFIED AREA  . COPD (chronic obstructive pulmonary disease) ([place])   . Deep venous thrombosis (HCC)    right lower extremity  . Diabetes mellitus    a. A1C 10.7 in 03/2013.  . Diastolic CHF ([place])    a. EF 40% by cath, 55-60% during 03/2013 adm, required IV diuresis.  . DVT, lower  extremity, recurrent ([place])    a. Hx recurrent DVT per record.  . Dyslipidemia   . Elevated CK    a. Pt has refused rheum workup in the past.  . GERD (gastroesophageal reflux disease)   . HTN (hypertension)    x 15 years  . Hx of cardiovascular stress test    a. Lexiscan Myoview (03/2010):  diaph atten vs inf scar, no ischemia, EF 47%; Low Risk.  Marland Kitchen Hx of echocardiogram    a. Echo (04/08/13):  Mild LVH, EF 55-60%, restrictive physiology, severe LAE, mild reduced RVSF, mild RAE.  . Leg pain    ABI 6/16:  R 1.2, L 1.1 - normal  . Obesity   . Peripheral neuropathy   . Pulmonary emboli ([place])    a. Hx PE per record.  . Pulmonary embolism ([place])   . Sleep apnea     Past Surgical History:  Procedure Laterality Date  . CORONARY ANGIOPLASTY WITH STENT PLACEMENT    . CORONARY ARTERY BYPASS GRAFT     4 time since 2002  . LEFT HEART CATHETERIZATION WITH CORONARY/GRAFT ANGIOGRAM  04/05/2013   Procedure: LEFT HEART CATHETERIZATION WITH Beatrix Fetters;  Surgeon: Peter M Martinique, MD;  Location: [facility] CATH LAB;  Service: Cardiovascular;;  . left knee surgery    . PERCUTANEOUS CORONARY STENT INTERVENTION (PCI-S)  04/05/2013   Procedure: PERCUTANEOUS CORONARY STENT INTERVENTION (PCI-S);  Surgeon: Peter M Martinique, MD;  Location: [facility] CATH LAB;  Service: Cardiovascular;;  DES to native Mid cx    Current Medications: Outpatient Medications Prior to Visit  Medication Sig Dispense Refill  . gabapentin (NEURONTIN) 300 MG capsule Take 300 mg by mouth as needed (pain in foot and ankle).    . Insulin Degludec (TRESIBA FLEXTOUCH) 200 UNIT/ML SOPN Inject 80 Units into the skin every evening.    . insulin lispro (HUMALOG KWIKPEN) 100 UNIT/ML injection Inject 25 Units into the skin 3 (three) times daily before meals. UAD    . nitroGLYCERIN (NITROSTAT) 0.4 MG SL tablet Place 1 tablet (0.4 mg total) under the tongue every 5 (five) minutes as needed for chest pain. NEEDS APPOINTMENT 25 tablet 6  . pantoprazole  (PROTONIX) 40 MG tablet Take 1 tablet (40 mg total) by mouth as needed (indigestion). 30 tablet 11  . warfarin (COUMADIN) 5 MG tablet TAKE AS DIRECTED BY COUMADIN CLINIC 60 tablet 0  . isosorbide mononitrate (IMDUR) 30 MG 24 hr tablet Take 1 tablet (30 mg total) by mouth daily. 30 tablet 11  . losartan (COZAAR) 100 MG tablet Take 1 tablet (100 mg total) by mouth daily. 30 tablet 11  . metoprolol succinate (TOPROL-XL) 25 MG 24 hr tablet Take 0.5 tablets (12.5 mg total) by mouth daily. 15 tablet 11  . furosemide (LASIX) 20 MG tablet Take 20 mg by mouth daily.    . furosemide (LASIX) 40 MG tablet Take 1  tablet (40 mg total) by mouth daily. 30 tablet 11   No facility-administered medications prior to visit.      Allergies:   Simvastatin   Social History   Social History  . Marital status: Married    Spouse name: N/A  . Number of children: N/A  . Years of education: N/A   Occupational History  . CONSTRUCTION [employer]   Social History Main Topics  . Smoking status: Never Smoker  . Smokeless tobacco: Never Used  . Alcohol use Yes     Comment: rare  . Drug use: No  . Sexual activity: Yes   Other Topics Concern  . 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      Family History:  The patient's family history includes Cancer in his father; Heart disease in his mother; Hypertension in his mother and sister.   ROS:   Please see the history of present illness.    ROS All other systems reviewed and are negative.   PHYSICAL EXAM:   VS:  BP (!) 157/79   Pulse 63   Ht 6' (1.829 m)   Wt 271 lb 9.6 oz (123.2 kg)   SpO2 96%   BMI 36.84 kg/m    GEN: Well nourished, well developed, in no acute distress  HEENT: normal  Neck: no JVD, carotid bruits, or masses Cardiac: RRR; no murmurs, rubs, or gallops,no edema  Respiratory: normal work of breathing +bibasilar crackle GI: soft, nontender,  nondistended, + BS MS: no deformity or atrophy  Skin: warm and dry, no rash Neuro:  Alert and Oriented x 3, Strength and sensation are intact Psych: euthymic mood, full affect  Wt Readings from Last 3 Encounters:  10/02/16 271 lb 9.6 oz (123.2 kg)  08/15/16 274 lb 1.9 oz (124.3 kg)  07/06/15 273 lb 3.2 oz (123.9 kg)      Studies/Labs Reviewed:   EKG:  EKG is not ordered today.   Recent Labs: 08/15/2016: ALT 28; TSH 2.120 09/24/2016: B Natriuretic Peptide 120.6; Hemoglobin 14.1; Platelets 175 10/02/2016: BUN 31; Creatinine, Ser 1.15; Potassium 5.2; Sodium 137   Lipid Panel    Component Value Date/Time   CHOL 125 08/15/2016 1509   TRIG 365 (H) 08/15/2016 1509   HDL 31 (L) 08/15/2016 1509   CHOLHDL 4.0 08/15/2016 1509   CHOLHDL 3.6 CALC 03/11/2008 0930   VLDL 21 03/11/2008 0930   LDLCALC 21 08/15/2016 1509    Additional studies/ records that were reviewed today include:   Echo 01/29/2014 LV EF: 55% -  60%  Study Conclusions  - Left ventricle: There was moderate concentric hypertrophy. Systolic function was normal. The estimated ejection fraction was in the range of 55% to 60%. - Aortic valve: Mild thickening and calcification, consistent with sclerosis. There was trivial regurgitation. - Mitral valve: Calcified annulus. - Left atrium: The atrium was mildly dilated. - Pulmonary arteries: PA peak pressure: 36 mm Hg (S).  Impressions:  - The right ventricular systolic pressure was increased consistent with mild pulmonary hypertension.     ASSESSMENT:    1. Acute on chronic diastolic (congestive) heart failure ([place])   2. OSA (obstructive sleep apnea)   3. Coronary artery disease involving coronary bypass graft of native heart without angina pectoris   4. Encounter for long-term (current) use of medications   5. Essential hypertension   6. Hyperlipidemia, unspecified hyperlipidemia type   7. Controlled type 2 diabetes  mellitus without complication, with  long-term current use of insulin ([place])   8. Chronic deep vein thrombosis (DVT) of proximal vein of lower extremity, unspecified laterality (HCC)      PLAN:  In order of problems listed above:  1. Acute on chronic diastolic heart failure: Continue to have shortness of breath when laying down and at night. We'll obtain basic metabolic panel today, if renal function stable, will plan to increase Lasix.  2. Obstructive sleep apnea: He was never started on CPAP, he said he did have to CPAP machine however could not tolerate the mask. I will refer him to Dr. Radford Pax for management of OSA  3. CAD s/p CABG: No obvious angina  4. History of recurrent DVT/PE: On Coumadin  5. Hypertension: Blood pressure mildly elevated today, it was normal last month.   6. Hyperlipidemia: Intolerant to statin. Last lipid panel month ago showed cholesterol 125, triglycerides 365, HDL 31, LDL 21.  7. DM 2: On insulin managed by primary care provider.    Medication Adjustments/Labs and Tests Ordered: Current medicines are reviewed at length with the patient today.  Concerns regarding medicines are outlined above.  Medication changes, Labs and Tests ordered today are listed in the Patient Instructions below. Patient Instructions  Medication Instructions:   INCREASE Lasix to 80mg  TWICE DAILY x 2 DAYS ONLY THEN resume 80mg  DAILY  Labwork:   BMET today Coumadin clinic visit today  Testing/Procedures:  None scheduled  Follow-Up:  In 2 weeks with Almyra Deforest, PA or [name], [place]  We are referring you to Dr. Radford Pax for a consultation regarding your sleep apnea.  If you need a refill on your cardiac medications before your next appointment, please call your pharmacy.      Hilbert Corrigan, [place]  10/03/2016 11:57 PM    [facility] [address], [place], [place]  [address] Phone: [phone]; Fax: [phone]

## 2016-10-03 ENCOUNTER — Encounter: Payer: Self-pay | Admitting: Pharmacist

## 2016-10-03 ENCOUNTER — Encounter: Payer: Self-pay | Admitting: Physician Assistant

## 2016-10-03 NOTE — Telephone Encounter (Signed)
This encounter was created in error - please disregard.

## 2016-10-03 NOTE — Telephone Encounter (Signed)
-----   Message from Theodore Demark, RN sent at 10/03/2016  3:49 PM EDT ----- Pt normally gets coumadin checked in [place]. He has coumadin clinic visit sched for 9/5 but wants to get this checked at [facility] since he is seeing Isaac Laud here on 9/6. Can we just put him on coumadin clinic schedule here that day & save him a trip to [place] office? Erasmo Downer did his adjustment when he came to office yesterday.

## 2016-10-09 NOTE — Telephone Encounter (Signed)
Patient would like to visit the retail store to pick out the mask he wants.  Order has been sent to [facility].

## 2016-10-09 NOTE — Addendum Note (Signed)
Addended by: Freada Bergeron on: 10/09/2016 03:26 PM   Modules accepted: Orders

## 2016-10-19 ENCOUNTER — Encounter: Payer: Self-pay | Admitting: Physician Assistant

## 2016-10-19 ENCOUNTER — Ambulatory Visit (INDEPENDENT_AMBULATORY_CARE_PROVIDER_SITE_OTHER): Payer: Managed Care, Other (non HMO) | Admitting: Pharmacist

## 2016-10-19 ENCOUNTER — Ambulatory Visit (INDEPENDENT_AMBULATORY_CARE_PROVIDER_SITE_OTHER): Payer: Managed Care, Other (non HMO) | Admitting: Physician Assistant

## 2016-10-19 VITALS — BP 130/80 | HR 70 | Ht 73.0 in | Wt 270.0 lb

## 2016-10-19 DIAGNOSIS — I2119 ST elevation (STEMI) myocardial infarction involving other coronary artery of inferior wall: Secondary | ICD-10-CM

## 2016-10-19 DIAGNOSIS — G4733 Obstructive sleep apnea (adult) (pediatric): Secondary | ICD-10-CM | POA: Diagnosis not present

## 2016-10-19 DIAGNOSIS — I25810 Atherosclerosis of coronary artery bypass graft(s) without angina pectoris: Secondary | ICD-10-CM | POA: Diagnosis not present

## 2016-10-19 DIAGNOSIS — Z79899 Other long term (current) drug therapy: Secondary | ICD-10-CM

## 2016-10-19 DIAGNOSIS — L0211 Cutaneous abscess of neck: Secondary | ICD-10-CM | POA: Diagnosis not present

## 2016-10-19 DIAGNOSIS — I4891 Unspecified atrial fibrillation: Secondary | ICD-10-CM

## 2016-10-19 DIAGNOSIS — I825Y9 Chronic embolism and thrombosis of unspecified deep veins of unspecified proximal lower extremity: Secondary | ICD-10-CM

## 2016-10-19 DIAGNOSIS — E785 Hyperlipidemia, unspecified: Secondary | ICD-10-CM

## 2016-10-19 DIAGNOSIS — I10 Essential (primary) hypertension: Secondary | ICD-10-CM | POA: Diagnosis not present

## 2016-10-19 DIAGNOSIS — R600 Localized edema: Secondary | ICD-10-CM

## 2016-10-19 DIAGNOSIS — I82409 Acute embolism and thrombosis of unspecified deep veins of unspecified lower extremity: Secondary | ICD-10-CM

## 2016-10-19 DIAGNOSIS — E119 Type 2 diabetes mellitus without complications: Secondary | ICD-10-CM

## 2016-10-19 DIAGNOSIS — Z794 Long term (current) use of insulin: Secondary | ICD-10-CM | POA: Diagnosis not present

## 2016-10-19 DIAGNOSIS — Z5181 Encounter for therapeutic drug level monitoring: Secondary | ICD-10-CM | POA: Diagnosis not present

## 2016-10-19 LAB — BASIC METABOLIC PANEL
BUN/Creatinine Ratio: 22 (ref 10–24)
BUN: 31 mg/dL — ABNORMAL HIGH (ref 8–27)
CO2: 23 mmol/L (ref 20–29)
Calcium: 9.7 mg/dL (ref 8.6–10.2)
Chloride: 96 mmol/L (ref 96–106)
Creatinine, Ser: 1.39 mg/dL — ABNORMAL HIGH (ref 0.76–1.27)
GFR calc Af Amer: 63 mL/min/{1.73_m2} (ref >59)
GFR calc non Af Amer: 54 mL/min/{1.73_m2} — ABNORMAL LOW (ref >59)
Glucose: 272 mg/dL — ABNORMAL HIGH (ref 65–99)
Potassium: 5.3 mmol/L — ABNORMAL HIGH (ref 3.5–5.2)
Sodium: 134 mmol/L (ref 134–144)

## 2016-10-19 LAB — POCT INR: INR: 1.9

## 2016-10-19 NOTE — Patient Instructions (Addendum)
Medication Instructions:   No changes, but we will advise if your diuretic (fluid pill) needs to be changed based on labwork.  Labwork:   BMET today  Testing/Procedures:  none  Follow-Up:  As scheduled with Dr. Percival Spanish  If you need a refill on your cardiac medications before your next appointment, please call your pharmacy.

## 2016-10-19 NOTE — Progress Notes (Signed)
Cardiology Office Note    Date:  10/21/2016   ID:  Bobby Lindsey, DOB 1955/07/28, MRN [id number]  PCP:  Adin Hector, MD  Cardiologist:  Dr. Percival Spanish  Chief Complaint  Patient presents with  . Follow-up    leg pain , swelling     History of Present Illness:  Bobby Lindsey is a 61 y.o. male with PMH of CAD s/p CABG 2002 and DES to RCA, recurrent PE/DVT, HTN, HLD, DM and OSA. He was admitted in February 2015 with inferior STEMI and underwent emergent cardiac catheterization. This demonstrated patent SVG to OM1 and LIMA to LAD, occluded SVG to diagonal and SVG to PDA. EF 40% with inferior wall abnormality. Echo showed normal LV EF prior to discharge. Culprit was felt to be occluded native left circumflex which was treated with drug-eluting stent. Hospitalization complicated by transient atrial fibrillation and acute on chronic diastolic heart failure. He has a history of elevated CK and remain off of statin. After she was started on amiodarone for A. fib, he developed Mobitz II AV block, beta blocker was discontinued. He later self discontinued amiodarone thinking it was causing dyspnea. Plavix was discontinued in September 2016. He was last seen by Truitt Merle on 08/15/2016, 6 month follow-up was recommended.  He went to the hospital ED on 09/24/2016 after noticing worsening shortness of breath with exertion and with laying flat. He is currently not wearing CPAP. He also noted 10 pounds weight gain. Interestingly, his BNP was 120.6, despite chest x-ray showed mild vascular congestion and the increased interstitial markings raising concern for mild interstitial edema. He was treated for this CHF exacerbation, his Lasix was increased to 80 mg from 40 mg for the next 5 days.  I last saw the patient on 10/02/2016, he continued to have 2+ lower extremity pitting edema. He was still taking 80 mg daily of Lasix at this time. He was developing orthopnea and episodes as well. I increase her Lasix  to 80 mg twice a day for 2 days before dropping down to 81 mg daily thereafter. He denied any chest pain. He says he never truly started on CPAP therapy as he is intolerant to facial mask, I referred him to Dr. Radford Pax for further management of CPAP.   Presents today for cartilage office evaluation. He continued to have 2+ pitting edema in the right lower extremity, 1+ pitting edema in the left lower extremity. He is currently on 80 mg daily of Lasix. I will obtain a basic metabolic panel today. If renal function stable, I plan to increased Lasix to either 80 mg a.m./40 mg p.m. or 80 mg twice a day depending on laboratory results. Otherwise he denies any chest pain. He continued to have dyspnea on exertion which I think is related to body size. He has been seen by Coumadin clinic today which revealed INR 1.9. This will need to be followed up later. He does have a open drainage on his neck and golf ball sized swelling in the area. I compressed area, some of pus did come out. He is having it evaluated tomorrow. Looks like soft tissue abscess. He is currently on sulfa antibiotics.   Past Medical History:  Diagnosis Date  . Atrial fibrillation ([place])    a. Transient during 03/2013 admission.  . Bradycardia    a. Bradycardia/pauses/possible Mobitz II during 03/2013 adm. Not on BB due to this.  Marland Kitchen CAD (coronary artery disease)    a. s/p CABG 2002. b.  Hx Cypher stent to the RCA. c. Inf-lat STEMI 03/2013:  LHC (04/05/13):  mLAD occluded, pD1 90, apical br of Dx occluded, CFX occluded, pOM1 90-95, RCA stents patent, diff RCA 30, S-Dx occluded, S-PDA occluded, S-OM1 40-50, L-LAD patent, EF 40% with inf HK.  PCI:  Promus (2.5 x 28) DES to mid to dist CFX.  Marland Kitchen Charcot's joint of knee    UNSPECIFIED AREA  . COPD (chronic obstructive pulmonary disease) ([place])   . Deep venous thrombosis (HCC)    right lower extremity  . Diabetes mellitus    a. A1C 10.7 in 03/2013.  . Diastolic CHF ([place])    a. EF 40% by cath, 55-60%  during 03/2013 adm, required IV diuresis.  . DVT, lower extremity, recurrent ([place])    a. Hx recurrent DVT per record.  . Dyslipidemia   . Elevated CK    a. Pt has refused rheum workup in the past.  . GERD (gastroesophageal reflux disease)   . HTN (hypertension)    x 15 years  . Hx of cardiovascular stress test    a. Lexiscan Myoview (03/2010):  diaph atten vs inf scar, no ischemia, EF 47%; Low Risk.  Marland Kitchen Hx of echocardiogram    a. Echo (04/08/13):  Mild LVH, EF 55-60%, restrictive physiology, severe LAE, mild reduced RVSF, mild RAE.  . Leg pain    ABI 6/16:  R 1.2, L 1.1 - normal  . Obesity   . Peripheral neuropathy   . Pulmonary emboli ([place])    a. Hx PE per record.  . Pulmonary embolism ([place])   . Sleep apnea     Past Surgical History:  Procedure Laterality Date  . CORONARY ANGIOPLASTY WITH STENT PLACEMENT    . CORONARY ARTERY BYPASS GRAFT     4 time since 2002  . LEFT HEART CATHETERIZATION WITH CORONARY/GRAFT ANGIOGRAM  04/05/2013   Procedure: LEFT HEART CATHETERIZATION WITH Beatrix Fetters;  Surgeon: Peter M Martinique, MD;  Location: [facility] CATH LAB;  Service: Cardiovascular;;  . left knee surgery    . PERCUTANEOUS CORONARY STENT INTERVENTION (PCI-S)  04/05/2013   Procedure: PERCUTANEOUS CORONARY STENT INTERVENTION (PCI-S);  Surgeon: Peter M Martinique, MD;  Location: [facility] C[facility] LAB;  Service: Cardiovascular;;  DES to native Mid cx    Current Medications: Outpatient Medications Prior to Visit  Medication Sig Dispense Refill  . furosemide (LASIX) 80 MG tablet Take 1 tablet (80 mg total) by mouth daily. 30 tablet 3  . gabapentin (NEURONTIN) 300 MG capsule Take 300 mg by mouth as needed (pain in foot and ankle).    . Insulin Degludec (TRESIBA FLEXTOUCH) 200 UNIT/ML SOPN Inject 80 Units into the skin every evening.    . insulin lispro (HUMALOG KWIKPEN) 100 UNIT/ML injection Inject 25 Units into the skin 3 (three) times daily before meals. UAD    . isosorbide mononitrate (IMDUR) 30 MG 24  hr tablet Take 1 tablet (30 mg total) by mouth daily. 30 tablet 3  . losartan (COZAAR) 100 MG tablet Take 1 tablet (100 mg total) by mouth daily. 30 tablet 3  . metoprolol succinate (TOPROL-XL) 25 MG 24 hr tablet Take 0.5 tablets (12.5 mg total) by mouth daily. 15 tablet 3  . nitroGLYCERIN (NITROSTAT) 0.4 MG SL tablet Place 1 tablet (0.4 mg total) under the tongue every 5 (five) minutes as needed for chest pain. NEEDS APPOINTMENT 25 tablet 6  . pantoprazole (PROTONIX) 40 MG tablet Take 1 tablet (40 mg total) by mouth as needed (indigestion). 30 tablet  11  . warfarin (COUMADIN) 5 MG tablet TAKE AS DIRECTED BY COUMADIN CLINIC 60 tablet 0   No facility-administered medications prior to visit.      Allergies:   Simvastatin   Social History   Social History  . Marital status: Married    Spouse name: N/A  . Number of children: N/A  . Years of education: N/A   Occupational History  . CONSTRUCTION [employer]   Social History Main Topics  . Smoking status: Never Smoker  . Smokeless tobacco: Never Used  . Alcohol use Yes     Comment: rare  . Drug use: No  . Sexual activity: Yes   Other Topics Concern  . 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      Family History:  The patient's family history includes Cancer in his father; Heart disease in his mother; Hypertension in his mother and sister.   ROS:   Please see the history of present illness.    ROS All other systems reviewed and are negative.   PHYSICAL EXAM:   VS:  BP 130/80   Pulse 70   Ht 6\' 1"  (1.854 m)   Wt 270 lb (122.5 kg)   BMI 35.62 kg/m    GEN: Well nourished, well developed, in no acute distress  HEENT: normal  Neck: no JVD, carotid bruits, or masses Cardiac: RRR; no murmurs, rubs, or gallops. 2+ RLE and 1+ LLE edema  Respiratory:  clear to auscultation bilaterally, normal work of breathing GI: soft, nontender, nondistended,  + BS MS: no deformity or atrophy  Skin: warm and dry, no rash Neuro:  Alert and Oriented x 3, Strength and sensation are intact Psych: euthymic mood, full affect  Wt Readings from Last 3 Encounters:  10/19/16 270 lb (122.5 kg)  10/02/16 271 lb 9.6 oz (123.2 kg)  08/15/16 274 lb 1.9 oz (124.3 kg)      Studies/Labs Reviewed:   EKG:  EKG is not ordered today  Recent Labs: 08/15/2016: ALT 28; TSH 2.120 09/24/2016: B Natriuretic Peptide 120.6; Hemoglobin 14.1; Platelets 175 10/19/2016: BUN 31; Creatinine, Ser 1.39; Potassium 5.3; Sodium 134   Lipid Panel    Component Value Date/Time   CHOL 125 08/15/2016 1509   TRIG 365 (H) 08/15/2016 1509   HDL 31 (L) 08/15/2016 1509   CHOLHDL 4.0 08/15/2016 1509   CHOLHDL 3.6 CALC 03/11/2008 0930   VLDL 21 03/11/2008 0930   LDLCALC 21 08/15/2016 1509    Additional studies/ records that were reviewed today include:   Echo 01/29/2014 LV EF: 55% -  60%  Study Conclusions  - Left ventricle: There was moderate concentric hypertrophy. Systolic function was normal. The estimated ejection fraction was in the range of 55% to 60%. - Aortic valve: Mild thickening and calcification, consistent with sclerosis. There was trivial regurgitation. - Mitral valve: Calcified annulus. - Left atrium: The atrium was mildly dilated. - Pulmonary arteries: PA peak pressure: 36 mm Hg (S).  Impressions:  - The right ventricular systolic pressure was increased consistent with mild pulmonary hypertension.   ASSESSMENT:    1. Lower extremity edema   2. Encounter for long-term (current) use of medications   3. Coronary artery disease involving coronary bypass graft of native heart without angina pectoris   4. Chronic deep vein thrombosis (DVT) of proximal vein of lower extremity, unspecified laterality ([place])   5. Essential hypertension   6. Hyperlipidemia, unspecified  hyperlipidemia type   7. Type 2 diabetes mellitus without complication, with  long-term current use of insulin ([place])   8. OSA (obstructive sleep apnea)   9. Cutaneous abscess of neck      PLAN:  In order of problems listed above:  1. Lower extremity edema: Continue to have 2+ right-sided lower extremity edema and 1+ left-sided lower extremity edema. I will obtain basic metabolic panel today, depending on the labwork, may increase his Lasix to 80 mg in a.m. and 40 mg in p.m.  2. Neck abscess: A golf ball-sized abscess located on the posterior side of the neck toward the left, it is covered with dressing, it is still draining some pus. He is having it evaluated tomorrow again. Currently on Septra  3. CAD s/p CABG: No obvious angina  4. H/o recurrent DVT / PE: On Coumadin, INR is low at 1.9 today according to our pharmacist, he is Coumadin dose was not changed as he is currently on sulfa abx  5. Hypertension:  Blood pressure stable  6. Hyperlipidemia: Intolerant to statins  7. DM 2: on insulin managed by primary care provider  8. OSA: Unable to tolerate CPAP Therapy, referring him to Dr. Radford Pax for management    Medication Adjustments/Labs and Tests Ordered: Current medicines are reviewed at length with the patient today.  Concerns regarding medicines are outlined above.  Medication changes, Labs and Tests ordered today are listed in the Patient Instructions below. Patient Instructions  Medication Instructions:   No changes, but we will advise if your diuretic (fluid pill) needs to be changed based on labwork.  Labwork:   BMET today  Testing/Procedures:  none  Follow-Up:  As scheduled with Dr. Percival Spanish  If you need a refill on your cardiac medications before your next appointment, please call your pharmacy.      Hilbert Corrigan, [place]  10/21/2016 8:41 AM    [address], [place], [place]  [address] Phone: [phone]; Fax: [phone]

## 2016-10-21 ENCOUNTER — Encounter: Payer: Self-pay | Admitting: Physician Assistant

## 2016-10-24 ENCOUNTER — Telehealth: Payer: Self-pay | Admitting: *Deleted

## 2016-10-24 DIAGNOSIS — Z79899 Other long term (current) drug therapy: Secondary | ICD-10-CM

## 2016-10-24 NOTE — Telephone Encounter (Signed)
Informed pt of results, recommendations. He voiced understanding to do repeat labwork at [place] asked if he needed to come to [facility] - I informed him he can go to a [place] site of his choice. Pt verbalized understanding, will call back if further needs.

## 2016-10-24 NOTE — Telephone Encounter (Signed)
-----   Message from [place], [place] sent at 10/20/2016 12:57 PM EDT ----- Kidney function worsened slightly compare to 2 weeks ago, but still without tolerable range as previous Cr ranged between 1.15 to 1.4 in the past year. Would hold off on increasing fluid pill for now. Instead, repeat blood work BMET in 2 weeks to trend, may need to scale back the medication if renal function worsen

## 2016-10-30 ENCOUNTER — Other Ambulatory Visit: Payer: Self-pay | Admitting: Cardiology

## 2016-10-30 NOTE — Telephone Encounter (Signed)
Patient on antibiotics. INR overdue since 10/23/2016

## 2016-11-01 NOTE — Progress Notes (Signed)
Cardiology Office Note   Date:  11/02/2016   ID:  Bobby Lindsey, DOB [birth year], MRN [id number]  PCP:  Bobby Hector, MD  Cardiologist:   Minus Breeding, MD    Chief Complaint  Patient presents with  . Edema      History of Present Illness: Bobby Lindsey is a 61 y.o. male who presents for follow up of dyspnea.  He has a history of CAD s/p CABG 2002 and DES to RCA, recurrent PE/DVT, HTN, HLD, DM and OSA.  He was admitted in February 2015 with inferior STEMI and underwent emergent cardiac catheterization. This demonstrated patent SVG to OM1 and LIMA to LAD, occluded SVG to diagonal and SVG to PDA. EF 40% with inferior wall abnormality. Echo showed normal LV EF prior to discharge. The culprit was felt to be an occluded native left circumflex which was treated with drug-eluting stent. the hospitalization was complicated by transient atrial fibrillation and acute on chronic diastolic heart failure.  He has a history of elevated CK and remain off of statin. After he was started on amiodarone for A. fib, he developed Mobitz II AV block, beta blocker was discontinued. He later self discontinued amiodarone thinking it was causing dyspnea. Plavix was discontinued in September 2016. He was in the ED in 09/24/2016 with dyspnea and edema.  He was given diuretic and sent home.  He was seen in the office and had increased Lasix PO.   Since then he has done relatively well.  He has less edema than before.  The patient denies any new symptoms such as chest discomfort, neck or arm discomfort. There has been no new shortness of breath, PND or orthopnea. There have been no reported palpitations, presyncope or syncope.    Past Medical History:  Diagnosis Date  . Atrial fibrillation ([place])    a. Transient during 03/2013 admission.  . Bradycardia    a. Bradycardia/pauses/possible Mobitz II during 03/2013 adm. Not on BB due to this.  Marland Kitchen CAD (coronary artery disease)    a. s/p CABG 2002. b. Hx Cypher stent  to the RCA. c. Inf-lat STEMI 03/2013:  LHC (04/05/13):  mLAD occluded, pD1 90, apical br of Dx occluded, CFX occluded, pOM1 90-95, RCA stents patent, diff RCA 30, S-Dx occluded, S-PDA occluded, S-OM1 40-50, L-LAD patent, EF 40% with inf HK.  PCI:  Promus (2.5 x 28) DES to mid to dist CFX.  Marland Kitchen Charcot's joint of knee    UNSPECIFIED AREA  . COPD (chronic obstructive pulmonary disease) ([place])   . Deep venous thrombosis (HCC)    right lower extremity  . Diabetes mellitus    a. A1C 10.7 in 03/2013.  . Diastolic CHF ([place])    a. EF 40% by cath, 55-60% during 03/2013 adm, required IV diuresis.  . DVT, lower extremity, recurrent ([place])    a. Hx recurrent DVT per record.  . Dyslipidemia   . Elevated CK    a. Pt has refused rheum workup in the past.  . GERD (gastroesophageal reflux disease)   . HTN (hypertension)    x 15 years  . Hx of cardiovascular stress test    a. Lexiscan Myoview (03/2010):  diaph atten vs inf scar, no ischemia, EF 47%; Low Risk.  Marland Kitchen Hx of echocardiogram    a. Echo (04/08/13):  Mild LVH, EF 55-60%, restrictive physiology, severe LAE, mild reduced RVSF, mild RAE.  . Leg pain    ABI 6/16:  R 1.2, L 1.1 -  normal  . Obesity   . Peripheral neuropathy   . Pulmonary emboli ([place])    a. Hx PE per record.  . Pulmonary embolism ([place])   . Sleep apnea     Past Surgical History:  Procedure Laterality Date  . CORONARY ANGIOPLASTY WITH STENT PLACEMENT    . CORONARY ARTERY BYPASS GRAFT     4 time since 2002  . LEFT HEART CATHETERIZATION WITH CORONARY/GRAFT ANGIOGRAM  04/05/2013   Procedure: LEFT HEART CATHETERIZATION WITH Beatrix Fetters;  Surgeon: Peter M Martinique, MD;  Location: [facility] CATH LAB;  Service: Cardiovascular;;  . left knee surgery    . PERCUTANEOUS CORONARY STENT INTERVENTION (PCI-S)  04/05/2013   Procedure: PERCUTANEOUS CORONARY STENT INTERVENTION (PCI-S);  Surgeon: Peter M Martinique, MD;  Location: [facility] CATH LAB;  Service: Cardiovascular;;  DES to native Mid cx     Current  Outpatient Prescriptions  Medication Sig Dispense Refill  . furosemide (LASIX) 80 MG tablet Take 1 tablet (80 mg total) by mouth daily. 30 tablet 3  . gabapentin (NEURONTIN) 300 MG capsule Take 300 mg by mouth as needed (pain in foot and ankle).    . Insulin Degludec (TRESIBA FLEXTOUCH) 200 UNIT/ML SOPN Inject 80 Units into the skin every evening.    . insulin lispro (HUMALOG KWIKPEN) 100 UNIT/ML injection Inject 25 Units into the skin 3 (three) times daily before meals. UAD    . isosorbide mononitrate (IMDUR) 30 MG 24 hr tablet Take 1 tablet (30 mg total) by mouth daily. 30 tablet 3  . losartan (COZAAR) 100 MG tablet Take 1 tablet (100 mg total) by mouth daily. 30 tablet 3  . metoprolol succinate (TOPROL-XL) 25 MG 24 hr tablet Take 0.5 tablets (12.5 mg total) by mouth daily. 15 tablet 3  . nitroGLYCERIN (NITROSTAT) 0.4 MG SL tablet Place 1 tablet (0.4 mg total) under the tongue every 5 (five) minutes as needed for chest pain. NEEDS APPOINTMENT 25 tablet 6  . pantoprazole (PROTONIX) 40 MG tablet Take 1 tablet (40 mg total) by mouth as needed (indigestion). 30 tablet 11  . sulfamethoxazole-trimethoprim (BACTRIM DS,SEPTRA DS) 800-160 MG tablet Take 1 tablet by mouth 2 (two) times daily.  0  . TOUJEO SOLOSTAR 300 UNIT/ML SOPN INJECT 88 UNITS SUBCUTANEOUSLY NIGHTLY.  5  . warfarin (COUMADIN) 5 MG tablet TAKE AS DIRECTED BY COUMADIN CLINIC 60 tablet 0   No current facility-administered medications for this visit.     Allergies:   Simvastatin    ROS:  Please see the history of present illness.   Otherwise, review of systems are positive for none.   All other systems are reviewed and negative.    PHYSICAL EXAM: VS:  BP (!) 146/81   Pulse 64   Ht 6' (1.829 m)   Wt 266 lb (120.7 kg)   BMI 36.08 kg/m  , BMI Body mass index is 36.08 kg/m.  GENERAL:  Well appearing NECK:  No jugular venous distention, waveform within normal limits, carotid upstroke brisk and symmetric, no bruits, no  thyromegaly LUNGS:  Clear to auscultation bilaterally CHEST:  Well healed sternotomy scar.  HEART:  PMI not displaced or sustained,S1 and S2 within normal limits, no S3, no S4, no clicks, no rubs, no murmurs ABD:  Flat, positive bowel sounds normal in frequency in pitch, no bruits, no rebound, no guarding, no midline pulsatile mass, no hepatomegaly, no splenomegaly EXT:  2 plus pulses throughout, mild non pitting edema, no cyanosis no clubbing, chronic venous stasis changes.  EKG:  EKG is ordered today. The ekg ordered today demonstrates sinus rhythm, rate 72, left bundle branch block, no acute ST-T wave changes.   Recent Labs: 08/15/2016: ALT 28; TSH 2.120 09/24/2016: B Natriuretic Peptide 120.6; Hemoglobin 14.1; Platelets 175 10/19/2016: BUN 31; Creatinine, Ser 1.39; Potassium 5.3; Sodium 134   Lab Results  Component Value Date   HGBA1C 10.4 (H) 03/31/2015    Lipid Panel    Component Value Date/Time   CHOL 125 08/15/2016 1509   TRIG 365 (H) 08/15/2016 1509   HDL 31 (L) 08/15/2016 1509   CHOLHDL 4.0 08/15/2016 1509   CHOLHDL 3.6 CALC 03/11/2008 0930   VLDL 21 03/11/2008 0930   LDLCALC 21 08/15/2016 1509      Wt Readings from Last 3 Encounters:  11/02/16 266 lb (120.7 kg)  10/19/16 270 lb (122.5 kg)  10/02/16 271 lb 9.6 oz (123.2 kg)      Other studies Reviewed: Additional studies/ records that were reviewed today include: . Review of the above records demonstrates:  Please see elsewhere in the note.     ASSESSMENT AND PLAN:    LOWER EXTREMITY EDEMA:  This is improved but his creat was up slightly earlier this month.  I will repeat a BMET.  For now he will stay on 80 mg Lasix  CAD:  The patient has no new sypmtoms.  No further cardiovascular testing is indicated.  We will continue with aggressive risk reduction and meds as listed.  DVT/PE:  He remains on chronic warfarin.    HTN: BP is elevated today but he did not take his meds.    HYPERLIPIDEMIA:  Chol is  excellent.  He will stay on meds as listed.   DM:  A1C is still above 10.  However, this is being actively managed by Bobby Hector, MD  OSA:  He was supposed to have follow up for this with Dr. Radford Pax.   He has sleep apnea and is starting to use the CPAP machine.  However, he is not happy with this and will need some work to be compliant.    Current medicines are reviewed at length with the patient today.  The patient does not have concerns regarding medicines.  The following changes have been made:  no change  Labs/ tests ordered today include:    Orders Placed This Encounter  Procedures  . Basic metabolic panel     Disposition:   FU with me 3 months.       Signed, Minus Breeding, MD  11/02/2016 10:47 PM    [facility]

## 2016-11-02 ENCOUNTER — Other Ambulatory Visit: Payer: Self-pay | Admitting: Cardiology

## 2016-11-02 ENCOUNTER — Ambulatory Visit (INDEPENDENT_AMBULATORY_CARE_PROVIDER_SITE_OTHER): Payer: Managed Care, Other (non HMO) | Admitting: Cardiology

## 2016-11-02 ENCOUNTER — Encounter: Payer: Self-pay | Admitting: Cardiology

## 2016-11-02 ENCOUNTER — Ambulatory Visit (INDEPENDENT_AMBULATORY_CARE_PROVIDER_SITE_OTHER): Payer: Managed Care, Other (non HMO) | Admitting: Pharmacist Clinician (PhC)/ Clinical Pharmacy Specialist

## 2016-11-02 VITALS — BP 146/81 | HR 64 | Ht 72.0 in | Wt 266.0 lb

## 2016-11-02 DIAGNOSIS — G4733 Obstructive sleep apnea (adult) (pediatric): Secondary | ICD-10-CM

## 2016-11-02 DIAGNOSIS — I82409 Acute embolism and thrombosis of unspecified deep veins of unspecified lower extremity: Secondary | ICD-10-CM

## 2016-11-02 DIAGNOSIS — Z79899 Other long term (current) drug therapy: Secondary | ICD-10-CM

## 2016-11-02 DIAGNOSIS — I2510 Atherosclerotic heart disease of native coronary artery without angina pectoris: Secondary | ICD-10-CM

## 2016-11-02 DIAGNOSIS — I10 Essential (primary) hypertension: Secondary | ICD-10-CM

## 2016-11-02 DIAGNOSIS — I4891 Unspecified atrial fibrillation: Secondary | ICD-10-CM

## 2016-11-02 DIAGNOSIS — R0602 Shortness of breath: Secondary | ICD-10-CM | POA: Diagnosis not present

## 2016-11-02 DIAGNOSIS — Z5181 Encounter for therapeutic drug level monitoring: Secondary | ICD-10-CM

## 2016-11-02 DIAGNOSIS — I2119 ST elevation (STEMI) myocardial infarction involving other coronary artery of inferior wall: Secondary | ICD-10-CM

## 2016-11-02 LAB — POCT INR: INR: 5.2

## 2016-11-02 NOTE — Patient Instructions (Signed)
Medication Instructions: Your physician recommends that you continue on your current medications as directed. Please refer to the Current Medication list given to you today.  If you need a refill on your cardiac medications before your next appointment, please call your pharmacy.   Labwork: Your physician recommends that you have a BMET drawn.   Follow-Up: Your physician wants you to follow-up in: 3 months with Dr. Percival Spanish. You will receive a reminder letter in the mail two months in advance. If you don't receive a letter, please call our office at [phone] to schedule this follow-up appointment.  Dr. Percival Spanish recommends an appointment with Dr. Radford Pax for sleep apnea.   Thank you for choosing [facility] at [facility]!!

## 2016-11-06 ENCOUNTER — Emergency Department (HOSPITAL_COMMUNITY)
Admission: EM | Admit: 2016-11-06 | Discharge: 2016-11-07 | Disposition: A | Discharge status: Home / Self Care | Payer: Managed Care, Other (non HMO) | Attending: Emergency Medicine | Admitting: Emergency Medicine

## 2016-11-06 ENCOUNTER — Emergency Department (HOSPITAL_COMMUNITY): Payer: Managed Care, Other (non HMO)

## 2016-11-06 ENCOUNTER — Encounter (HOSPITAL_COMMUNITY): Payer: Self-pay

## 2016-11-06 DIAGNOSIS — E1140 Type 2 diabetes mellitus with diabetic neuropathy, unspecified: Secondary | ICD-10-CM | POA: Insufficient documentation

## 2016-11-06 DIAGNOSIS — R0602 Shortness of breath: Secondary | ICD-10-CM | POA: Diagnosis present

## 2016-11-06 DIAGNOSIS — J069 Acute upper respiratory infection, unspecified: Secondary | ICD-10-CM | POA: Insufficient documentation

## 2016-11-06 DIAGNOSIS — Z79899 Other long term (current) drug therapy: Secondary | ICD-10-CM | POA: Insufficient documentation

## 2016-11-06 DIAGNOSIS — Z951 Presence of aortocoronary bypass graft: Secondary | ICD-10-CM | POA: Insufficient documentation

## 2016-11-06 DIAGNOSIS — I2510 Atherosclerotic heart disease of native coronary artery without angina pectoris: Secondary | ICD-10-CM | POA: Insufficient documentation

## 2016-11-06 DIAGNOSIS — B9789 Other viral agents as the cause of diseases classified elsewhere: Secondary | ICD-10-CM | POA: Insufficient documentation

## 2016-11-06 DIAGNOSIS — I5032 Chronic diastolic (congestive) heart failure: Secondary | ICD-10-CM | POA: Diagnosis not present

## 2016-11-06 DIAGNOSIS — Z7901 Long term (current) use of anticoagulants: Secondary | ICD-10-CM | POA: Diagnosis not present

## 2016-11-06 DIAGNOSIS — I110 Hypertensive heart disease with heart failure: Secondary | ICD-10-CM | POA: Insufficient documentation

## 2016-11-06 DIAGNOSIS — J449 Chronic obstructive pulmonary disease, unspecified: Secondary | ICD-10-CM | POA: Insufficient documentation

## 2016-11-06 DIAGNOSIS — Z794 Long term (current) use of insulin: Secondary | ICD-10-CM | POA: Diagnosis not present

## 2016-11-06 DIAGNOSIS — Z955 Presence of coronary angioplasty implant and graft: Secondary | ICD-10-CM | POA: Insufficient documentation

## 2016-11-06 LAB — BASIC METABOLIC PANEL
Anion gap: 7 (ref 5–15)
BUN: 42 mg/dL — ABNORMAL HIGH (ref 6–20)
CO2: 25 mmol/L (ref 22–32)
Calcium: 8.6 mg/dL — ABNORMAL LOW (ref 8.9–10.3)
Chloride: 101 mmol/L (ref 101–111)
Creatinine, Ser: 1.63 mg/dL — ABNORMAL HIGH (ref 0.61–1.24)
GFR calc Af Amer: 51 mL/min — ABNORMAL LOW (ref >60)
GFR calc non Af Amer: 44 mL/min — ABNORMAL LOW (ref >60)
Glucose, Bld: 257 mg/dL — ABNORMAL HIGH (ref 65–99)
Potassium: 3.9 mmol/L (ref 3.5–5.1)
Sodium: 133 mmol/L — ABNORMAL LOW (ref 135–145)

## 2016-11-06 LAB — CBC
HCT: 38.9 % — ABNORMAL LOW (ref 39.0–52.0)
Hemoglobin: 13 g/dL (ref 13.0–17.0)
MCH: 28.5 pg (ref 26.0–34.0)
MCHC: 33.4 g/dL (ref 30.0–36.0)
MCV: 85.3 fL (ref 78.0–100.0)
Platelets: 132 10*3/uL — ABNORMAL LOW (ref 150–400)
RBC: 4.56 MIL/uL (ref 4.22–5.81)
RDW: 13.6 % (ref 11.5–15.5)
WBC: 6 10*3/uL (ref 4.0–10.5)

## 2016-11-06 LAB — PROTIME-INR
INR: 1.4
Prothrombin Time: 17.1 seconds — ABNORMAL HIGH (ref 11.4–15.2)

## 2016-11-06 LAB — I-STAT TROPONIN, ED: Troponin i, poc: 0.02 ng/mL (ref 0.00–0.08)

## 2016-11-06 LAB — BRAIN NATRIURETIC PEPTIDE: B Natriuretic Peptide: 159.5 pg/mL — ABNORMAL HIGH (ref 0.0–100.0)

## 2016-11-06 NOTE — ED Notes (Signed)
Repeat ECG preformed per Dr. Dolphus Jenny. Dr. Ralene Bathe at pt bedside

## 2016-11-06 NOTE — ED Notes (Signed)
Pt called x 1. No response

## 2016-11-06 NOTE — ED Triage Notes (Signed)
Pt reports nasal congestion, cough, chills since Friday. Saturday night he reports having chest pain that went away on its own with no ntg use. PT states he has become increasingly SOB at rest that gets worse with light activity. NAD VSS. Pt has cardiac hx

## 2016-11-07 LAB — I-STAT TROPONIN, ED: Troponin i, poc: 0.03 ng/mL (ref 0.00–0.08)

## 2016-11-07 MED ORDER — BENZONATATE 100 MG PO CAPS
100.0000 mg | ORAL_CAPSULE | Freq: Once | ORAL | Status: AC
  Administered 2016-11-07: 100 mg via ORAL
  Filled 2016-11-07: qty 1

## 2016-11-07 MED ORDER — SALINE SPRAY 0.65 % NA SOLN: 1.0000 | NASAL | 0 refills | Status: DC | PRN

## 2016-11-07 MED ORDER — FUROSEMIDE 20 MG PO TABS
40.0000 mg | ORAL_TABLET | Freq: Once | ORAL | Status: AC
Start: 2016-11-07 — End: 2016-11-07
  Administered 2016-11-07: 40 mg via ORAL
  Filled 2016-11-07: qty 2

## 2016-11-07 MED ORDER — FLUTICASONE PROPIONATE 50 MCG/ACT NA SUSP: 2.0000 | Freq: Every day | NASAL | 0 refills | Status: DC

## 2016-11-07 MED ORDER — BENZONATATE 100 MG PO CAPS: 100.0000 mg | ORAL_CAPSULE | Freq: Three times a day (TID) | ORAL | 0 refills | Status: DC | PRN

## 2016-11-07 NOTE — ED Notes (Signed)
Patient ambulated down hallway and back. States that he feels "exhausted and dizzy". Oxygen sat began and remained at 91%.

## 2016-11-07 NOTE — ED Provider Notes (Signed)
[facility] DEPT Provider Note   CSN: [id number] Arrival date & time: 11/06/16  1612     History   Chief Complaint No chief complaint on file.   HPI Bobby Lindsey is a 61 y.o. male.  HPI  This is a 61 year old male with history of atrial fibrillation, coronary artery disease, DVT, sleep apnea who presents with cough, shortness of breath, nasal congestion, and sore throat. Onset of symptoms on Friday. He saw his primary physician on Friday and was prescribed azithromycin. He has one day left. He states that he is not feeling any better. Cough is nonproductive. He does report green rhinorrhea and sore throat. He had one isolated fever on Friday to 100.5. He did receive his flu shot last week.  No history of smoking or COPD. Patient is on Lasix and has issues with fluid retention. He does report baseline difficulty with orthopnea. No worsening. Denies any nausea, vomiting, abdominal pain. Reports that he had one episode of shortness duration chest pain on Saturday. No chest pain at this time.  Past Medical History:  Diagnosis Date  . Atrial fibrillation ([place])    a. Transient during 03/2013 admission.  . Bradycardia    a. Bradycardia/pauses/possible Mobitz II during 03/2013 adm. Not on BB due to this.  Marland Kitchen CAD (coronary artery disease)    a. s/p CABG 2002. b. Hx Cypher stent to the RCA. c. Inf-lat STEMI 03/2013:  LHC (04/05/13):  mLAD occluded, pD1 90, apical br of Dx occluded, CFX occluded, pOM1 90-95, RCA stents patent, diff RCA 30, S-Dx occluded, S-PDA occluded, S-OM1 40-50, L-LAD patent, EF 40% with inf HK.  PCI:  Promus (2.5 x 28) DES to mid to dist CFX.  Marland Kitchen Charcot's joint of knee    UNSPECIFIED AREA  . COPD (chronic obstructive pulmonary disease) ([place])   . Deep venous thrombosis (HCC)    right lower extremity  . Diabetes mellitus    a. A1C 10.7 in 03/2013.  . Diastolic CHF ([place])    a. EF 40% by cath, 55-60% during 03/2013 adm, required IV diuresis.  . DVT, lower extremity,  recurrent ([place])    a. Hx recurrent DVT per record.  . Dyslipidemia   . Elevated CK    a. Pt has refused rheum workup in the past.  . GERD (gastroesophageal reflux disease)   . HTN (hypertension)    x 15 years  . Hx of cardiovascular stress test    a. Lexiscan Myoview (03/2010):  diaph atten vs inf scar, no ischemia, EF 47%; Low Risk.  Marland Kitchen Hx of echocardiogram    a. Echo (04/08/13):  Mild LVH, EF 55-60%, restrictive physiology, severe LAE, mild reduced RVSF, mild RAE.  . Leg pain    ABI 6/16:  R 1.2, L 1.1 - normal  . Obesity   . Peripheral neuropathy   . Pulmonary emboli ([place])    a. Hx PE per record.  . Pulmonary embolism ([place])   . Sleep apnea     Patient Active Problem List   Diagnosis Date Noted  . DDD (degenerative disc disease), lumbar 03/27/2016  . History of recurrent deep vein thrombosis (DVT) 03/26/2016  . History of pulmonary embolism 10/01/2015  . 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 Cellulitis of elbow 01/15/2015  . Septic olecranon bursitis 01/15/2015  . Tabes dorsalis 12/16/2013  . Persistent cough 12/16/2013  . Encounter for therapeutic drug monitoring 04/14/2013  . Other pulmonary embolism and infarction 04/14/2013  .  History of recurrent DVT/E 04/11/2013  . Obstructive sleep apnea 04/11/2013  . Bradycardia 04/11/2013  . History of Elevated CK level  04/11/2013  . Obesity 04/11/2013  . Abnormal levels of other serum enzymes 04/11/2013  . Acute thromboembolism of deep veins of lower extremity ([place]) 04/11/2013  . Peripheral neuropathy ([place])   . Charcot's arthropathy   . Chronic obstructive pulmonary disease ([place])   . Chronic diastolic heart failure ([place]) 04/08/2013  . Atrial fibrillation ([place]) 04/07/2013  . Acute myocardial infarction of inferolateral wall (HCC) 04/05/2013  . Abnormal glucose measurement 07/18/2011  . Type 2 diabetes mellitus ([place]) 07/18/2011  . Reactive depression  ([place]) 05/16/2011  . Long term (current) use of anticoagulants 09/21/2010  . Muscle pain 09/06/2010  . 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 ED (erectile dysfunction) of organic origin 07/28/2010  . HYPERKALEMIA 07/20/2009  . Uncontrolled type 2 diabetes mellitus with insulin therapy ([place]) 05/26/2008  . Hyperlipidemia 05/26/2008  . Hypertension 05/26/2008  . CAD (coronary artery disease) 05/26/2008    Past Surgical History:  Procedure Laterality Date  . CORONARY ANGIOPLASTY WITH STENT PLACEMENT    . CORONARY ARTERY BYPASS GRAFT     4 time since 2002  . LEFT HEART CATHETERIZATION WITH CORONARY/GRAFT ANGIOGRAM  04/05/2013   Procedure: LEFT HEART CATHETERIZATION WITH Beatrix Fetters;  Surgeon: Peter M Martinique, MD;  Location[facility] C[facility] LAB;  Service: Cardiovascular;;  . left knee surgery    . PERCUTANEOUS CORONARY STENT INTERVENTION (PCI-S)  04/05/2013   Procedure: PERCUTANEOUS CORONARY STENT INTERVENTION (PCI-S);  Surgeon: Peter M Martinique, MD;  Location: [facility] CATH LAB;  Service: Cardiovascular;;  DES to native Mid cx       Home Medications    Prior to Admission medications   Medication Sig Start Date End Date Taking? Authorizing Provider  azithromycin (ZITHROMAX) 250 MG tablet Take 250 mg by mouth daily. For 5 days (started 11/02/16)   Yes [provider]  furosemide (LASIX) 80 MG tablet Take 1 tablet (80 mg total) by mouth daily. 10/02/16  Yes Almyra Deforest, PA  gabapentin (NEURONTIN) 300 MG capsule Take 300 mg by mouth as needed (pain in foot and ankle).   Yes [provider]  Insulin Degludec (TRESIBA FLEXTOUCH) 200 UNIT/ML SOPN Inject 80 Units into the skin every evening.   Yes [provider]  insulin lispro (HUMALOG KWIKPEN) 100 UNIT/ML injection Inject 25 Units into the skin 3 (three) times daily before meals. UAD   Yes [provider]  isosorbide mononitrate (IMDUR) 30 MG 24 hr tablet Take 1 tablet (30 mg total) by mouth daily. 10/02/16  Yes Almyra Deforest, PA  losartan (COZAAR) 100 MG tablet Take 1 tablet (100 mg total) by mouth daily. 10/02/16  Yes Almyra Deforest, PA  metoprolol succinate (TOPROL-XL) 25 MG 24 hr tablet Take 0.5 tablets (12.5 mg total) by mouth daily. 10/02/16  Yes Almyra Deforest, PA  nitroGLYCERIN (NITROSTAT) 0.4 MG SL tablet Place 1 tablet (0.4 mg total) under the tongue every 5 (five) minutes as needed for chest pain. NEEDS APPOINTMENT 08/15/16  Yes Burtis Junes, NP  pantoprazole (PROTONIX) 40 MG tablet Take 1 tablet (40 mg total) by mouth as needed (indigestion). 08/15/16  Yes Gerhardt, Marlane Hatcher, NP  TOUJEO SOLOSTAR 300 UNIT/ML SOPN INJECT 88 UNITS SUBCUTANEOUSLY NIGHTLY. 10/23/16  Yes [provider]  warfarin (COUMADIN) 10 MG tablet Take 10 mg by mouth every evening.   Yes [provider]  benzonatate (TESSALON) 100 MG capsule Take 1 capsule (100 mg total)  by mouth 3 (three) times daily as needed for cough. 11/07/16   Hayes Czaja, Barbette Hair, MD  fluticasone (FLONASE) 50 MCG/ACT nasal spray Place 2 sprays into both nostrils daily. 11/07/16   Chonte Ricke, Barbette Hair, MD  sodium chloride (OCEAN) 0.65 % SOLN nasal spray Place 1 spray into both nostrils as needed for congestion. 11/07/16   Jakyrie Totherow, Barbette Hair, MD  warfarin (COUMADIN) 5 MG tablet TAKE AS DIRECTED BY COUMADIN CLINIC Patient not taking: Reported on 11/07/2016 11/02/16   Minus Breeding, MD    Family History Family History  Problem Relation Age of Onset  . Heart disease Mother   . Hypertension Mother   . Cancer Father   . Hypertension Sister   . Heart attack Neg Hx   . Stroke Neg Hx     Social History Social History  Substance Use Topics  . Smoking status: Never Smoker  . Smokeless tobacco: Never Used  . Alcohol use Yes     Comment: rare     Allergies   Simvastatin   Review of Systems Review of Systems  Constitutional: Positive for fever.  HENT: Positive for rhinorrhea and sore throat. Negative for trouble swallowing.   Respiratory: Positive for cough  and shortness of breath.   Cardiovascular: Positive for chest pain.  Gastrointestinal: Negative for abdominal pain, nausea and vomiting.  All other systems reviewed and are negative.    Physical Exam Updated Vital Signs BP 130/71   Pulse 65   Temp 98.6 F (37 C) (Oral)   Resp 17   Wt 120.7 kg (266 lb)   SpO2 93%   BMI 36.08 kg/m   Physical Exam  Constitutional: He is oriented to person, place, and time.  Overweight, no acute distress  HENT:  Head: Normocephalic and atraumatic.  Right Ear: External ear normal.  Left Ear: External ear normal.  Mild erythema posterior oropharynx, uvula midline, no tonsillar swelling, no exudate, no trismus  Eyes: Pupils are equal, round, and reactive to light.  Neck:  Sebaceous cyst left posterior neck  Cardiovascular: Normal rate, regular rhythm and normal heart sounds.   No murmur heard. Pulmonary/Chest: Effort normal and breath sounds normal. No respiratory distress. He has no wheezes. He has no rales.  Abdominal: Soft. Bowel sounds are normal. There is no tenderness. There is no rebound.  Musculoskeletal: He exhibits edema.  1+ bilateral lower extremity edema  Neurological: He is alert and oriented to person, place, and time.  Skin: Skin is warm and dry.  Psychiatric: He has a normal mood and affect.  Nursing note and vitals reviewed.    ED Treatments / Results  Labs (all labs ordered are listed, but only abnormal results are displayed) Labs Reviewed  BASIC METABOLIC PANEL - Abnormal; Notable for the following:       Result Value   Sodium 133 (*)    Glucose, Bld 257 (*)    BUN 42 (*)    Creatinine, Ser 1.63 (*)    Calcium 8.6 (*)    GFR calc non Af Amer 44 (*)    GFR calc Af Amer 51 (*)    All other components within normal limits  CBC - Abnormal; Notable for the following:    HCT 38.9 (*)    Platelets 132 (*)    All other components within normal limits  BRAIN NATRIURETIC PEPTIDE - Abnormal; Notable for the following:      B Natriuretic Peptide 159.5 (*)    All other components within normal limits  PROTIME-INR - Abnormal; Notable for the following:    Prothrombin Time 17.1 (*)    All other components within normal limits  I-STAT TROPONIN, ED  I-STAT TROPONIN, ED    EKG  EKG Interpretation  Date/Time:  Monday November 06 2016 16:53:53 EDT Ventricular Rate:  65 PR Interval:  194 QRS Duration: 94 QT Interval:  442 QTC Calculation: 459 R Axis:   39 Text Interpretation:  Normal sinus rhythm Possible Inferior infarct , age undetermined Abnormal ECG When compared with ECG of 09/24/2016, No significant change was found Confirmed by Delora Fuel ([id number]) on 11/06/2016 11:39:21 PM       Radiology Dg Chest 2 View  Result Date: 11/06/2016 CLINICAL DATA:  Left chest pain and burning today with shortness of breath. Cough for 2 days. History of diabetes. EXAM: CHEST  2 VIEW COMPARISON:  Radiographs 09/24/2016 and 03/04/2015. FINDINGS: Stable mild cardiomegaly status post median sternotomy and CABG. There is chronic pleural thickening and basilar scarring on the left. No edema, confluent airspace opacity or pleural effusion is demonstrated. The bones appear unchanged. IMPRESSION: Chronic postsurgical changes in the left hemithorax. No acute cardiopulmonary process. Electronically Signed   By: Richardean Sale M.D.   On: 11/06/2016 17:43    Procedures Procedures (including critical care time)  Medications Ordered in ED Medications  furosemide (LASIX) tablet 40 mg (not administered)  benzonatate (TESSALON) capsule 100 mg (100 mg Oral Given 11/07/16 0037)     Initial Impression / Assessment and Plan / ED Course  I have reviewed the triage vital signs and the nursing notes.  Pertinent labs & imaging results that were available during my care of the patient were reviewed by me and considered in my medical decision making (see chart for details).     Patient presents with upper respiratory symptoms including  cough, rhinorrhea, one isolated fever. Currently finishing a course of antibiotics. His overall nontoxic-appearing. In no acute respiratory distress. He is afebrile. Workup is largely reassuring. Creatinine mildly bumped 1.6. Troponin 2 negative. EKG nonischemic. No significant leukocytosis. Patient does have 1+ lower extremity edema. May have some element of mild volume overload; however, chest x-ray is clear. He is currently being appropriately covered for community-acquired pneumonia. Suspect his symptoms are mostly related to viral infection. Recommend supportive measures including Tessalon Perles for cough, nasal saline and nasal steroid for rhinorrhea. I discussed this at length with patient and his wife. He ambulated and maintain his pulse ox 91-92%. No wheezing on exam. He may have a small element of volume overload. However, given his slight bump in creatinine, would not aggressively further diuresed. He was given one additional dose of Lasix.  Follow-up with primary physician recommended.  After history, exam, and medical workup I feel the patient has been appropriately medically screened and is safe for discharge home. Pertinent diagnoses were discussed with the patient. Patient was given return precautions.   Final Clinical Impressions(s) / ED Diagnoses   Final diagnoses:  Viral upper respiratory tract infection with cough    New Prescriptions New Prescriptions   BENZONATATE (TESSALON) 100 MG CAPSULE    Take 1 capsule (100 mg total) by mouth 3 (three) times daily as needed for cough.   FLUTICASONE (FLONASE) 50 MCG/ACT NASAL SPRAY    Place 2 sprays into both nostrils daily.   SODIUM CHLORIDE (OCEAN) 0.65 % SOLN NASAL SPRAY    Place 1 spray into both nostrils as needed for congestion.     Merryl Hacker, MD 11/07/16 [phone]

## 2016-11-07 NOTE — Discharge Instructions (Signed)
You were seen today for congestion, cough. You likely have a viral illness. Take Tessalon Perles for cough. Use nasal saline and nasal steroids for rhinorrhea. Viruses can last 7-10 days. Finish your course of antibiotics.  If you develop fevers, worsening shortness of breath or any new worsening symptoms you should be reevaluated.

## 2016-11-08 ENCOUNTER — Ambulatory Visit (INDEPENDENT_AMBULATORY_CARE_PROVIDER_SITE_OTHER): Payer: Managed Care, Other (non HMO)

## 2016-11-08 DIAGNOSIS — Z5181 Encounter for therapeutic drug level monitoring: Secondary | ICD-10-CM | POA: Diagnosis not present

## 2016-11-08 DIAGNOSIS — I82409 Acute embolism and thrombosis of unspecified deep veins of unspecified lower extremity: Secondary | ICD-10-CM | POA: Diagnosis not present

## 2016-11-08 DIAGNOSIS — I4891 Unspecified atrial fibrillation: Secondary | ICD-10-CM

## 2016-11-08 DIAGNOSIS — I2119 ST elevation (STEMI) myocardial infarction involving other coronary artery of inferior wall: Secondary | ICD-10-CM

## 2016-11-08 LAB — POCT INR: INR: 1.4

## 2016-11-21 DIAGNOSIS — E1142 Type 2 diabetes mellitus with diabetic polyneuropathy: Secondary | ICD-10-CM | POA: Insufficient documentation

## 2016-11-21 DIAGNOSIS — E1151 Type 2 diabetes mellitus with diabetic peripheral angiopathy without gangrene: Secondary | ICD-10-CM | POA: Insufficient documentation

## 2016-11-21 DIAGNOSIS — E11621 Type 2 diabetes mellitus with foot ulcer: Secondary | ICD-10-CM | POA: Insufficient documentation

## 2016-11-21 DIAGNOSIS — L97512 Non-pressure chronic ulcer of other part of right foot with fat layer exposed: Secondary | ICD-10-CM

## 2016-11-21 DIAGNOSIS — T25022A Burn of unspecified degree of left foot, initial encounter: Secondary | ICD-10-CM | POA: Insufficient documentation

## 2016-11-22 ENCOUNTER — Ambulatory Visit (INDEPENDENT_AMBULATORY_CARE_PROVIDER_SITE_OTHER): Payer: Managed Care, Other (non HMO)

## 2016-11-22 ENCOUNTER — Other Ambulatory Visit
Admission: RE | Admit: 2016-11-22 | Discharge: 2016-11-22 | Disposition: A | Discharge status: Home / Self Care | Payer: Managed Care, Other (non HMO) | Source: Ambulatory Visit | Attending: Internal Medicine | Admitting: Internal Medicine

## 2016-11-22 DIAGNOSIS — I4891 Unspecified atrial fibrillation: Secondary | ICD-10-CM

## 2016-11-22 DIAGNOSIS — Z5181 Encounter for therapeutic drug level monitoring: Secondary | ICD-10-CM

## 2016-11-22 DIAGNOSIS — I82409 Acute embolism and thrombosis of unspecified deep veins of unspecified lower extremity: Secondary | ICD-10-CM

## 2016-11-22 DIAGNOSIS — R791 Abnormal coagulation profile: Secondary | ICD-10-CM | POA: Diagnosis present

## 2016-11-22 DIAGNOSIS — I2119 ST elevation (STEMI) myocardial infarction involving other coronary artery of inferior wall: Secondary | ICD-10-CM

## 2016-11-22 LAB — PROTIME-INR
INR: 4.24
Prothrombin Time: 40.5 seconds — ABNORMAL HIGH (ref 11.4–15.2)

## 2016-11-22 LAB — POCT INR: INR: 6.2

## 2016-11-22 NOTE — Patient Instructions (Signed)
Vitamin K Foods and Warfarin Warfarin is a blood thinner (anticoagulant). Anticoagulant medicines help prevent the formation of blood clots. These medicines work by decreasing the activity of vitamin K, which promotes normal blood clotting. When you take warfarin, problems can occur from suddenly increasing or decreasing the amount of vitamin K that you eat from one day to the next. Problems may include:  Blood clots.  Bleeding. What general guidelines do I need to follow? To avoid problems when taking warfarin:  Eat a balanced diet that includes:  Fresh fruits and vegetables.  Whole grains.  Low-fat dairy products.  Lean proteins, such as fish, eggs, and lean cuts of meat.  Keep your intake of vitamin K consistent from day to day. To do this:  Avoid eating large amounts of vitamin K one day and low amounts of vitamin K the next day.  If you take a multivitamin that contains vitamin K, be sure to take it every day.  Know which foods contain vitamin K. Use the lists below to understand serving sizes and the amount of vitamin K in one serving.  Avoid major changes in your diet. If you are going to change your diet, talk with your health care provider before making changes.  Work with a nutrition specialist (dietitian) to develop a meal plan that works best for you. High vitamin K foods Foods that are high in vitamin K contain more than 100 mcg (micrograms) per serving. These include:  Broccoli (cooked) -  cup has 110 mcg.  Brussels sprouts (cooked) -  cup has 109 mcg.  Greens, beet (cooked) -  cup has 350 mcg.  Greens, collard (cooked) -  cup has 418 mcg.  Greens, turnip (cooked) -  cup has 265 mcg.  Green onions or scallions -  cup has 105 mcg.  Kale (fresh or frozen) -  cup has 531 mcg.  Parsley (raw) - 10 sprigs has 164 mcg.  Spinach (cooked) -  cup has 444 mcg.  Swiss chard (cooked) -  cup has 287 mcg. Moderate vitamin K foods Foods that have a  moderate amount of vitamin K contain 25-100 mcg per serving. These include:  Asparagus (cooked) - 5 spears have 38 mcg.  Black-eyed peas (dried) -  cup has 32 mcg.  Cabbage (cooked) -  cup has 37 mcg.  Kiwi fruit - 1 medium has 31 mcg.  Lettuce - 1 cup has 57-63 mcg.  Okra (frozen) -  cup has 44 mcg.  Prunes (dried) - 5 prunes have 25 mcg.  Watercress (raw) - 1 cup has 85 mcg. Low vitamin K foods Foods low in vitamin K contain less than 25 mcg per serving. These include:  Artichoke - 1 medium has 18 mcg.  Avocado - 1 oz. has 6 mcg.  Blueberries -  cup has 14 mcg.  Cabbage (raw) -  cup has 21 mcg.  Carrots (cooked) -  cup has 11 mcg.  Cauliflower (raw) -  cup has 11 mcg.  Cucumber with peel (raw) -  cup has 9 mcg.  Grapes -  cup has 12 mcg.  Mango - 1 medium has 9 mcg.  Nuts - 1 oz. has 15 mcg.  Pear - 1 medium has 8 mcg.  Peas (cooked) -  cup has 19 mcg.  Pickles - 1 spear has 14 mcg.  Pumpkin seeds - 1 oz. has 13 mcg.  Sauerkraut (canned) -  cup has 16 mcg.  Soybeans (cooked) -  cup has 16 mcg.    Tomato (raw) - 1 medium has 10 mcg.  Tomato sauce -  cup has 17 mcg. Vitamin K-free foods If a food contain less than 5 mcg per serving, it is considered to have no vitamin K. These foods include:  Bread and cereal products.  Cheese.  Eggs.  Fish and shellfish.  Meat and poultry.  Milk and dairy products.  Sunflower seeds. Actual amounts of vitamin K in foods may be different depending on processing. Talk with your dietitian about what foods you can eat and what foods you should avoid. This information is not intended to replace advice given to you by your health care provider. Make sure you discuss any questions you have with your health care provider. Document Released: 11/27/2008 Document Revised: 08/22/2015 Document Reviewed: 05/05/2015 Elsevier Interactive Patient Education  2017 [employer]  

## 2016-11-23 ENCOUNTER — Other Ambulatory Visit: Payer: Self-pay

## 2016-11-23 ENCOUNTER — Emergency Department (HOSPITAL_BASED_OUTPATIENT_CLINIC_OR_DEPARTMENT_OTHER)
Admit: 2016-11-23 | Discharge: 2016-11-23 | Disposition: A | Discharge status: Home / Self Care | Payer: Managed Care, Other (non HMO) | Attending: Emergency Medicine | Admitting: Emergency Medicine

## 2016-11-23 ENCOUNTER — Inpatient Hospital Stay (HOSPITAL_COMMUNITY)
Admission: EM | Admit: 2016-11-23 | Discharge: 2016-11-26 | DRG: 291 | Disposition: A | Discharge status: Home / Self Care | Payer: Managed Care, Other (non HMO) | Attending: Internal Medicine | Admitting: Internal Medicine

## 2016-11-23 ENCOUNTER — Encounter (HOSPITAL_COMMUNITY): Payer: Self-pay | Admitting: Cardiology

## 2016-11-23 ENCOUNTER — Emergency Department (HOSPITAL_COMMUNITY): Payer: Managed Care, Other (non HMO)

## 2016-11-23 DIAGNOSIS — L03116 Cellulitis of left lower limb: Secondary | ICD-10-CM | POA: Diagnosis present

## 2016-11-23 DIAGNOSIS — I248 Other forms of acute ischemic heart disease: Secondary | ICD-10-CM | POA: Diagnosis present

## 2016-11-23 DIAGNOSIS — I2510 Atherosclerotic heart disease of native coronary artery without angina pectoris: Secondary | ICD-10-CM | POA: Diagnosis present

## 2016-11-23 DIAGNOSIS — N183 Type 2 diabetes mellitus with diabetic chronic kidney disease: Secondary | ICD-10-CM | POA: Diagnosis present

## 2016-11-23 DIAGNOSIS — I272 Pulmonary hypertension, unspecified: Secondary | ICD-10-CM | POA: Diagnosis present

## 2016-11-23 DIAGNOSIS — J9621 Acute and chronic respiratory failure with hypoxia: Secondary | ICD-10-CM | POA: Diagnosis present

## 2016-11-23 DIAGNOSIS — X088XXA Exposure to other specified smoke, fire and flames, initial encounter: Secondary | ICD-10-CM | POA: Diagnosis present

## 2016-11-23 DIAGNOSIS — E669 Obesity, unspecified: Secondary | ICD-10-CM | POA: Diagnosis present

## 2016-11-23 DIAGNOSIS — T25022A Burn of unspecified degree of left foot, initial encounter: Secondary | ICD-10-CM | POA: Diagnosis present

## 2016-11-23 DIAGNOSIS — G4733 Obstructive sleep apnea (adult) (pediatric): Secondary | ICD-10-CM | POA: Diagnosis present

## 2016-11-23 DIAGNOSIS — I252 Old myocardial infarction: Secondary | ICD-10-CM | POA: Insufficient documentation

## 2016-11-23 DIAGNOSIS — J449 Chronic obstructive pulmonary disease, unspecified: Secondary | ICD-10-CM | POA: Diagnosis present

## 2016-11-23 DIAGNOSIS — I214 Non-ST elevation (NSTEMI) myocardial infarction: Secondary | ICD-10-CM | POA: Diagnosis not present

## 2016-11-23 DIAGNOSIS — E1122 Type 2 diabetes mellitus with diabetic chronic kidney disease: Secondary | ICD-10-CM

## 2016-11-23 DIAGNOSIS — I130 Hypertensive heart and chronic kidney disease with heart failure and stage 1 through stage 4 chronic kidney disease, or unspecified chronic kidney disease: Secondary | ICD-10-CM | POA: Diagnosis not present

## 2016-11-23 DIAGNOSIS — I509 Heart failure, unspecified: Secondary | ICD-10-CM

## 2016-11-23 DIAGNOSIS — E871 Hypo-osmolality and hyponatremia: Secondary | ICD-10-CM | POA: Diagnosis present

## 2016-11-23 DIAGNOSIS — E1165 Type 2 diabetes mellitus with hyperglycemia: Secondary | ICD-10-CM | POA: Diagnosis present

## 2016-11-23 DIAGNOSIS — M7989 Other specified soft tissue disorders: Secondary | ICD-10-CM | POA: Diagnosis not present

## 2016-11-23 DIAGNOSIS — Z86718 Personal history of other venous thrombosis and embolism: Secondary | ICD-10-CM

## 2016-11-23 DIAGNOSIS — K219 Gastro-esophageal reflux disease without esophagitis: Secondary | ICD-10-CM | POA: Diagnosis present

## 2016-11-23 DIAGNOSIS — E1142 Type 2 diabetes mellitus with diabetic polyneuropathy: Secondary | ICD-10-CM

## 2016-11-23 DIAGNOSIS — Z794 Long term (current) use of insulin: Secondary | ICD-10-CM

## 2016-11-23 DIAGNOSIS — Z79899 Other long term (current) drug therapy: Secondary | ICD-10-CM

## 2016-11-23 DIAGNOSIS — I5031 Acute diastolic (congestive) heart failure: Secondary | ICD-10-CM | POA: Diagnosis not present

## 2016-11-23 DIAGNOSIS — Z6834 Body mass index (BMI) 34.0-34.9, adult: Secondary | ICD-10-CM

## 2016-11-23 DIAGNOSIS — Z7901 Long term (current) use of anticoagulants: Secondary | ICD-10-CM | POA: Diagnosis not present

## 2016-11-23 DIAGNOSIS — E785 Hyperlipidemia, unspecified: Secondary | ICD-10-CM | POA: Diagnosis present

## 2016-11-23 DIAGNOSIS — I119 Hypertensive heart disease without heart failure: Secondary | ICD-10-CM | POA: Diagnosis present

## 2016-11-23 DIAGNOSIS — T45515A Adverse effect of anticoagulants, initial encounter: Secondary | ICD-10-CM | POA: Diagnosis present

## 2016-11-23 DIAGNOSIS — E11610 Type 2 diabetes mellitus with diabetic neuropathic arthropathy: Secondary | ICD-10-CM | POA: Diagnosis present

## 2016-11-23 DIAGNOSIS — I480 Paroxysmal atrial fibrillation: Secondary | ICD-10-CM | POA: Diagnosis present

## 2016-11-23 DIAGNOSIS — R791 Abnormal coagulation profile: Secondary | ICD-10-CM | POA: Diagnosis present

## 2016-11-23 DIAGNOSIS — J069 Acute upper respiratory infection, unspecified: Secondary | ICD-10-CM | POA: Diagnosis present

## 2016-11-23 DIAGNOSIS — J189 Pneumonia, unspecified organism: Secondary | ICD-10-CM

## 2016-11-23 DIAGNOSIS — Z7902 Long term (current) use of antithrombotics/antiplatelets: Secondary | ICD-10-CM

## 2016-11-23 DIAGNOSIS — Z955 Presence of coronary angioplasty implant and graft: Secondary | ICD-10-CM

## 2016-11-23 DIAGNOSIS — Z86711 Personal history of pulmonary embolism: Secondary | ICD-10-CM | POA: Diagnosis present

## 2016-11-23 DIAGNOSIS — I5033 Acute on chronic diastolic (congestive) heart failure: Secondary | ICD-10-CM | POA: Diagnosis present

## 2016-11-23 LAB — BRAIN NATRIURETIC PEPTIDE: B Natriuretic Peptide: 428.6 pg/mL — ABNORMAL HIGH (ref 0.0–100.0)

## 2016-11-23 LAB — I-STAT TROPONIN, ED: Troponin i, poc: 0.87 ng/mL (ref 0.00–0.08)

## 2016-11-23 LAB — URINALYSIS, ROUTINE W REFLEX MICROSCOPIC
Bacteria, UA: NONE SEEN
Bilirubin Urine: NEGATIVE
Glucose, UA: 50 mg/dL — AB
Hgb urine dipstick: NEGATIVE
Ketones, ur: NEGATIVE mg/dL
Leukocytes, UA: NEGATIVE
Nitrite: NEGATIVE
Protein, ur: 30 mg/dL — AB
Specific Gravity, Urine: 1.02 (ref 1.005–1.030)
pH: 5 (ref 5.0–8.0)

## 2016-11-23 LAB — CBC WITH DIFFERENTIAL/PLATELET
Basophils Absolute: 0 10*3/uL (ref 0.0–0.1)
Basophils Relative: 0 %
Eosinophils Absolute: 0 10*3/uL (ref 0.0–0.7)
Eosinophils Relative: 0 %
HCT: 39 % (ref 39.0–52.0)
Hemoglobin: 13.2 g/dL (ref 13.0–17.0)
Lymphocytes Relative: 13 %
Lymphs Abs: 2.1 10*3/uL (ref 0.7–4.0)
MCH: 28.8 pg (ref 26.0–34.0)
MCHC: 33.8 g/dL (ref 30.0–36.0)
MCV: 85.2 fL (ref 78.0–100.0)
Monocytes Absolute: 1.8 10*3/uL — ABNORMAL HIGH (ref 0.1–1.0)
Monocytes Relative: 11 %
Neutro Abs: 13 10*3/uL — ABNORMAL HIGH (ref 1.7–7.7)
Neutrophils Relative %: 76 %
Platelets: 166 10*3/uL (ref 150–400)
RBC: 4.58 MIL/uL (ref 4.22–5.81)
RDW: 13.8 % (ref 11.5–15.5)
WBC: 17 10*3/uL — ABNORMAL HIGH (ref 4.0–10.5)

## 2016-11-23 LAB — COMPREHENSIVE METABOLIC PANEL
ALT: 33 U/L (ref 17–63)
AST: 43 U/L — ABNORMAL HIGH (ref 15–41)
Albumin: 3 g/dL — ABNORMAL LOW (ref 3.5–5.0)
Alkaline Phosphatase: 127 U/L — ABNORMAL HIGH (ref 38–126)
Anion gap: 9 (ref 5–15)
BUN: 28 mg/dL — ABNORMAL HIGH (ref 6–20)
CO2: 25 mmol/L (ref 22–32)
Calcium: 8.5 mg/dL — ABNORMAL LOW (ref 8.9–10.3)
Chloride: 95 mmol/L — ABNORMAL LOW (ref 101–111)
Creatinine, Ser: 1.42 mg/dL — ABNORMAL HIGH (ref 0.61–1.24)
GFR calc Af Amer: >60 mL/min (ref >60)
GFR calc non Af Amer: 52 mL/min — ABNORMAL LOW (ref >60)
Glucose, Bld: 295 mg/dL — ABNORMAL HIGH (ref 65–99)
Potassium: 4.3 mmol/L (ref 3.5–5.1)
Sodium: 129 mmol/L — ABNORMAL LOW (ref 135–145)
Total Bilirubin: 1.5 mg/dL — ABNORMAL HIGH (ref 0.3–1.2)
Total Protein: 6.7 g/dL (ref 6.5–8.1)

## 2016-11-23 LAB — PROTIME-INR
INR: 2.54
Prothrombin Time: 27.1 seconds — ABNORMAL HIGH (ref 11.4–15.2)

## 2016-11-23 LAB — GLUCOSE, CAPILLARY: Glucose-Capillary: 372 mg/dL — ABNORMAL HIGH (ref 65–99)

## 2016-11-23 LAB — TROPONIN I
Troponin I: 1.13 ng/mL (ref <0.03)
Troponin I: 1.33 ng/mL (ref <0.03)

## 2016-11-23 LAB — D-DIMER, QUANTITATIVE (NOT AT ARMC): D-Dimer, Quant: 0.32 ug/mL-FEU (ref 0.00–0.50)

## 2016-11-23 MED ORDER — SILVER SULFADIAZINE 1 % EX CREA
1.0000 "application " | TOPICAL_CREAM | Freq: Two times a day (BID) | CUTANEOUS | Status: DC
  Administered 2016-11-24 – 2016-11-26 (×3): 1 via TOPICAL
  Filled 2016-11-23 (×2): qty 85

## 2016-11-23 MED ORDER — SODIUM CHLORIDE 0.9% FLUSH
3.0000 mL | Freq: Two times a day (BID) | INTRAVENOUS | Status: DC
  Administered 2016-11-23 – 2016-11-26 (×6): 3 mL via INTRAVENOUS

## 2016-11-23 MED ORDER — LOSARTAN POTASSIUM 50 MG PO TABS
100.0000 mg | ORAL_TABLET | Freq: Every day | ORAL | Status: DC
  Administered 2016-11-24 – 2016-11-26 (×3): 100 mg via ORAL
  Filled 2016-11-23 (×3): qty 2

## 2016-11-23 MED ORDER — ASPIRIN EC 81 MG PO TBEC
81.0000 mg | DELAYED_RELEASE_TABLET | Freq: Every day | ORAL | Status: DC
  Administered 2016-11-24 – 2016-11-26 (×3): 81 mg via ORAL
  Filled 2016-11-23 (×3): qty 1

## 2016-11-23 MED ORDER — NITROGLYCERIN 0.4 MG SL SUBL: 0.4000 mg | SUBLINGUAL_TABLET | SUBLINGUAL | Status: DC | PRN

## 2016-11-23 MED ORDER — ASPIRIN 81 MG PO CHEW
324.0000 mg | CHEWABLE_TABLET | Freq: Once | ORAL | Status: AC
  Administered 2016-11-23: 324 mg via ORAL
  Filled 2016-11-23: qty 4

## 2016-11-23 MED ORDER — IPRATROPIUM-ALBUTEROL 0.5-2.5 (3) MG/3ML IN SOLN
3.0000 mL | Freq: Four times a day (QID) | RESPIRATORY_TRACT | Status: DC
  Administered 2016-11-24: 3 mL via RESPIRATORY_TRACT
  Filled 2016-11-23: qty 3

## 2016-11-23 MED ORDER — DOXYCYCLINE HYCLATE 100 MG PO TABS
100.0000 mg | ORAL_TABLET | Freq: Two times a day (BID) | ORAL | Status: DC
  Administered 2016-11-23 – 2016-11-26 (×6): 100 mg via ORAL
  Filled 2016-11-23 (×6): qty 1

## 2016-11-23 MED ORDER — METOPROLOL SUCCINATE ER 25 MG PO TB24
12.5000 mg | ORAL_TABLET | Freq: Every day | ORAL | Status: DC
  Administered 2016-11-24 – 2016-11-26 (×3): 12.5 mg via ORAL
  Filled 2016-11-23 (×3): qty 1

## 2016-11-23 MED ORDER — GABAPENTIN 300 MG PO CAPS: 300.0000 mg | ORAL_CAPSULE | Freq: Three times a day (TID) | ORAL | Status: DC | PRN

## 2016-11-23 MED ORDER — FLUTICASONE PROPIONATE 50 MCG/ACT NA SUSP
2.0000 | Freq: Every day | NASAL | Status: DC
  Administered 2016-11-25 – 2016-11-26 (×2): 2 via NASAL
  Filled 2016-11-23: qty 16

## 2016-11-23 MED ORDER — SODIUM CHLORIDE 0.9 % IV SOLN: 250.0000 mL | INTRAVENOUS | Status: DC | PRN

## 2016-11-23 MED ORDER — ACETAMINOPHEN 325 MG PO TABS: 650.0000 mg | ORAL_TABLET | ORAL | Status: DC | PRN

## 2016-11-23 MED ORDER — ISOSORBIDE MONONITRATE ER 30 MG PO TB24
30.0000 mg | ORAL_TABLET | Freq: Every day | ORAL | Status: DC
  Administered 2016-11-24 – 2016-11-26 (×3): 30 mg via ORAL
  Filled 2016-11-23 (×3): qty 1

## 2016-11-23 MED ORDER — SALINE SPRAY 0.65 % NA SOLN: 1.0000 | NASAL | Status: DC | PRN

## 2016-11-23 MED ORDER — FUROSEMIDE 10 MG/ML IJ SOLN
80.0000 mg | Freq: Two times a day (BID) | INTRAMUSCULAR | Status: DC
  Administered 2016-11-23 – 2016-11-26 (×6): 80 mg via INTRAVENOUS
  Filled 2016-11-23 (×6): qty 8

## 2016-11-23 MED ORDER — SODIUM CHLORIDE 0.9% FLUSH: 3.0000 mL | INTRAVENOUS | Status: DC | PRN

## 2016-11-23 MED ORDER — ONDANSETRON HCL 4 MG/2ML IJ SOLN: 4.0000 mg | Freq: Four times a day (QID) | INTRAMUSCULAR | Status: DC | PRN

## 2016-11-23 NOTE — ED Notes (Signed)
Changed bandage on left leg.

## 2016-11-23 NOTE — ED Notes (Signed)
No cp at this time 
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
## 2016-11-23 NOTE — ED Triage Notes (Signed)
Pt c/o nasal congestion, productive cough, fever and chills x's 3 weeks.  St's he finished antibiotic but not feeling any better

## 2016-11-23 NOTE — ED Notes (Signed)
Pt taken to xray 
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
## 2016-11-23 NOTE — ED Notes (Signed)
I stat troponin results given to Dr. Kathrynn Humble by B. Yolanda Bonine, EMT
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
## 2016-11-23 NOTE — ED Provider Notes (Addendum)
[facility] DEPT Provider Note   CSN: [id number] Arrival date & time: 11/23/16  1621     History   Chief Complaint Chief Complaint  Patient presents with  . Fever  . Cough    HPI Bobby Lindsey is a 61 y.o. male.  HPI Patient comes in with multiple complaints. Patient has history of A. fib on Coumadin, coronary artery disease, CHF - EF 40%, COPD. Patient reports that he has been sick for the last several weeks. Patient started having weakness and shortness of breath about 4 weeks ago. Patient was seen by his primary care doctor and started on azithromycin. Patient was having some URI-like symptoms and sore throat at that time as well. Patient never improved with the antibiotics and came to the ER 2 weeks ago. It was considered that possibly patient was mildly volume overloaded and asked her take extra Lasix. Patient has since then followed up with cardiology, and he was deemed to be stable.   Patient reports that he continues to have productive cough, sore throat, congestion, shortness of breath area patient has been sleeping on a recliner for the last 3 weeks due to dyspnea on laying flat. Patient denies any associated chest pain, diaphoresis, nausea. He thinks that his shortness of breath on exertion is getting worse.  Review of system is positive for subjective fevers and dizziness. Dizziness is typically present when patient gets up and when he is trying to walk and is described as unsteadiness. No associated numbness, tingling, vision changes, weakness.  Past Medical History:  Diagnosis Date  . Atrial fibrillation ([place])    a. Transient during 03/2013 admission.  . Bradycardia    a. Bradycardia/pauses/possible Mobitz II during 03/2013 adm. Not on BB due to this.  Marland Kitchen CAD (coronary artery disease)    a. s/p CABG 2002. b. Hx Cypher stent to the RCA. c. Inf-lat STEMI 03/2013:  LHC (04/05/13):  mLAD occluded, pD1 90, apical br of Dx occluded, CFX occluded, pOM1 90-95, RCA stents  patent, diff RCA 30, S-Dx occluded, S-PDA occluded, S-OM1 40-50, L-LAD patent, EF 40% with inf HK.  PCI:  Promus (2.5 x 28) DES to mid to dist CFX.  Marland Kitchen Charcot's joint of knee   . COPD (chronic obstructive pulmonary disease) ([place])   . Deep venous thrombosis (HCC)    right lower extremity  . Diabetes mellitus    a. A1C 10.7 in 03/2013.  . Diastolic CHF ([place])    a. EF 40% by cath, 55-60% during 03/2013 adm, required IV diuresis.  . DVT, lower extremity, recurrent ([place])    a. Hx recurrent DVT per record.  . Dyslipidemia   . Elevated CK    a. Pt has refused rheum workup in the past.  . GERD (gastroesophageal reflux disease)   . HTN (hypertension)    x 15 years  . Hx of cardiovascular stress test    a. Lexiscan Myoview (03/2010):  diaph atten vs inf scar, no ischemia, EF 47%; Low Risk.  Marland Kitchen Hx of echocardiogram    a. Echo (04/08/13):  Mild LVH, EF 55-60%, restrictive physiology, severe LAE, mild reduced RVSF, mild RAE.  . Leg pain    ABI 6/16:  R 1.2, L 1.1 - normal  . Obesity   . Peripheral neuropathy   . Pulmonary embolism ([place])   . Sleep apnea     Patient Active Problem List   Diagnosis Date Noted  . 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 Stage 3 chronic kidney  disease due to diabetes mellitus ([place]) 11/23/2016  . NSTEMI (non-ST elevated myocardial infarction) ([place]) 11/23/2016  . CAD (coronary artery disease)   . DDD (degenerative disc disease), lumbar 03/27/2016  . History of recurrent deep vein thrombosis (DVT) 03/26/2016  . History of pulmonary embolism 10/01/2015  . Nephropathy, diabetic ([place]) 10/01/2015  . 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 History of recurrent DVT/E 04/11/2013  . Obstructive sleep apnea 04/11/2013  . Bradycardia 04/11/2013  . History of Elevated CK level  04/11/2013  . Obesity 04/11/2013  . Peripheral neuropathy ([place])   . Charcot's arthropathy   . Chronic obstructive pulmonary disease ([place])   . Chronic diastolic  heart failure ([place]) 04/08/2013  . Paroxysmal atrial fibrillation ([place]) 04/07/2013  . Reactive depression (situational) 05/16/2011  . Long term (current) use of anticoagulants 09/21/2010  . 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 ED (erectile dysfunction) of organic origin 07/28/2010  . Poorly controlled type 2 diabetes mellitus with peripheral neuropathy ([place]) 05/26/2008  . Hyperlipidemia 05/26/2008  . Hypertensive heart disease 05/26/2008    Past Surgical History:  Procedure Laterality Date  . CORONARY ANGIOPLASTY WITH STENT PLACEMENT    . CORONARY ARTERY BYPASS GRAFT     4 time since 2002  . LEFT HEART CATHETERIZATION WITH CORONARY/GRAFT ANGIOGRAM  04/05/2013   Procedure: LEFT HEART CATHETERIZATION WITH Beatrix Fetters;  Surgeon: Peter M Martinique, MD;  Location: [facility] CATH LAB;  Service: Cardiovascular;;  . left knee surgery    . PERCUTANEOUS CORONARY STENT INTERVENTION (PCI-S)  04/05/2013   Procedure: PERCUTANEOUS CORONARY STENT INTERVENTION (PCI-S);  Surgeon: Peter M Martinique, MD;  Location: [facility] CATH LAB;  Service: Cardiovascular;;  DES to native Mid cx       Home Medications    Prior to Admission medications   Medication Sig Start Date End Date Taking? Authorizing Provider  doxycycline (ADOXA) 100 MG tablet Take 100 mg by mouth 2 (two) times daily. Started taking 11/21/2016 for 14 days this is day three 11/21/16 12/05/16 Yes [provider]  fluticasone (FLONASE) 50 MCG/ACT nasal spray Place 2 sprays into both nostrils daily. 11/07/16  Yes Horton, Barbette Hair, MD  furosemide (LASIX) 80 MG tablet Take 1 tablet (80 mg total) by mouth daily. 10/02/16  Yes Almyra Deforest, PA  gabapentin (NEURONTIN) 300 MG capsule Take 300 mg by mouth as needed (pain in foot and ankle).   Yes [provider]  Insulin Degludec (TRESIBA FLEXTOUCH) 200 UNIT/ML SOPN Inject 80 Units into the skin every evening.   Yes [provider]  insulin lispro (HUMALOG KWIKPEN) 100 UNIT/ML injection Inject 25  Units into the skin 3 (three) times daily before meals. UAD   Yes [provider]  isosorbide mononitrate (IMDUR) 30 MG 24 hr tablet Take 1 tablet (30 mg total) by mouth daily. 10/02/16  Yes Almyra Deforest, PA  losartan (COZAAR) 100 MG tablet Take 1 tablet (100 mg total) by mouth daily. 10/02/16  Yes Almyra Deforest, PA  metoprolol succinate (TOPROL-XL) 25 MG 24 hr tablet Take 0.5 tablets (12.5 mg total) by mouth daily. 10/02/16  Yes Almyra Deforest, PA  nitroGLYCERIN (NITROSTAT) 0.4 MG SL tablet Place 1 tablet (0.4 mg total) under the tongue every 5 (five) minutes as needed for chest pain. NEEDS APPOINTMENT 08/15/16  Yes Burtis Junes, NP  pantoprazole (PROTONIX) 40 MG tablet Take 1 tablet (40 mg total) by mouth as needed (indigestion). Patient taking differently: Take 20 mg by mouth 2 (two) times daily.  08/15/16  Yes Burtis Junes, NP  silver sulfADIAZINE (SILVADENE) 1 % cream Apply 1 application topically 2 (two) times daily. Use on his left foot 11/21/16 01/13/17 Yes [provider]  sodium chloride (OCEAN) 0.65 % SOLN nasal spray Place 1 spray into both nostrils as needed for congestion. 11/07/16  Yes Horton, Barbette Hair, MD  TOUJEO SOLOSTAR 300 UNIT/ML SOPN INJECT 88 UNITS SUBCUTANEOUSLY NIGHTLY. 10/23/16  Yes [provider]  warfarin (COUMADIN) 5 MG tablet Take 10 mg by mouth every evening.   Yes [provider]  benzonatate (TESSALON) 100 MG capsule Take 1 capsule (100 mg total) by mouth 3 (three) times daily as needed for cough. Patient not taking: Reported on 11/23/2016 11/07/16   Horton, Barbette Hair, MD  warfarin (COUMADIN) 5 MG tablet TAKE AS DIRECTED BY COUMADIN CLINIC Patient not taking: Reported on 11/07/2016 11/02/16   Minus Breeding, MD    Family History Family History  Problem Relation Age of Onset  . Heart disease Mother   . Hypertension Mother   . Cancer Father   . Hypertension Sister   . Heart attack Neg Hx   . Stroke Neg Hx     Social History Social  History  Substance Use Topics  . Smoking status: Never Smoker  . Smokeless tobacco: Never Used  . Alcohol use Yes     Comment: rare     Allergies   Simvastatin   Review of Systems Review of Systems  Constitutional: Positive for activity change.  HENT: Positive for congestion.   Respiratory: Positive for shortness of breath.   Cardiovascular: Negative for palpitations.  Allergic/Immunologic: Negative for immunocompromised state.  Hematological: Bruises/bleeds easily.  All other systems reviewed and are negative.    Physical Exam Updated Vital Signs BP (!) 103/50   Pulse 76   Temp 98.6 F (37 C) (Oral)   Resp 20   SpO2 95%   Physical Exam  Constitutional: He is oriented to person, place, and time. He appears well-developed.  HENT:  Head: Atraumatic.  Neck: Neck supple. JVD present.  Cardiovascular: Normal rate.   Murmur heard. Pulmonary/Chest: Effort normal. He has no wheezes. He has rales.  Abdominal: Soft. There is no tenderness.  Musculoskeletal: He exhibits edema. He exhibits no deformity.  Neurological: He is alert and oriented to person, place, and time. No cranial nerve deficit. Coordination normal.  Cerebellar exam is normal (finger to nose) Sensory exam normal for bilateral upper and lower extremities - and patient is able to discriminate between sharp and dull. Motor exam is 4+/5   Skin: Skin is warm.  Nursing note and vitals reviewed.    ED Treatments / Results  Labs (all labs ordered are listed, but only abnormal results are displayed) Labs Reviewed  COMPREHENSIVE METABOLIC PANEL - Abnormal; Notable for the following:       Result Value   Sodium 129 (*)    Chloride 95 (*)    Glucose, Bld 295 (*)    BUN 28 (*)    Creatinine, Ser 1.42 (*)    Calcium 8.5 (*)    Albumin 3.0 (*)    AST 43 (*)    Alkaline Phosphatase 127 (*)    Total Bilirubin 1.5 (*)    GFR calc non Af Amer 52 (*)    All other components within normal limits  BRAIN  NATRIURETIC PEPTIDE - Abnormal; Notable for the following:    B Natriuretic Peptide 428.6 (*)    All other components within normal limits  PROTIME-INR - Abnormal; Notable for the following:  Prothrombin Time 27.1 (*)    All other components within normal limits  TROPONIN I - Abnormal; Notable for the following:    Troponin I 1.13 (*)    All other components within normal limits  I-STAT TROPONIN, ED - Abnormal; Notable for the following:    Troponin i, poc 0.87 (*)    All other components within normal limits  D-DIMER, QUANTITATIVE (NOT AT [facility])  CBC WITH DIFFERENTIAL/PLATELET  URINALYSIS, ROUTINE W REFLEX MICROSCOPIC  D-DIMER, QUANTITATIVE (NOT AT [facility])  TROPONIN I  TROPONIN I  TROPONIN I    EKG  EKG Interpretation  Date/Time:  Thursday November 23 2016 16:36:53 EDT Ventricular Rate:  76 PR Interval:  190 QRS Duration: 92 QT Interval:  394 QTC Calculation: 443 R Axis:   80 Text Interpretation:  Normal sinus rhythm with sinus arrhythmia Possible Inferior infarct , age undetermined ST & T wave abnormality, consider lateral ischemia Abnormal ECG avR ST elevation is more pronounced Confirmed by Varney Biles [phone]) on 11/23/2016 4:55:57 PM       Radiology Dg Chest 2 View  Result Date: 11/23/2016 CLINICAL DATA:  Cough and fever for 3 weeks question pneumonia, history hypertension, CHF, diabetes mellitus, COPD, prior pulmonary emboli and DVT, atrial fibrillation EXAM: CHEST  2 VIEW COMPARISON:  11/06/2016 FINDINGS: Enlargement of cardiac silhouette post CABG. Mediastinal contours and pulmonary vascularity normal. Scarring at lingula. Central peribronchial thickening. No infiltrate, pleural effusion or pneumothorax. Bones demineralized. IMPRESSION: Enlargement of cardiac silhouette post CABG. Bronchitic changes with lingular scarring. No acute infiltrate. Electronically Signed   By: Lavonia Dana M.D.   On: 11/23/2016 20:30    Procedures Procedures (including critical care  time)  CRITICAL CARE Performed by: Varney Biles   Total critical care time: 38 minutes  Critical care time was exclusive of separately billable procedures and treating other patients.  Critical care was necessary to treat or prevent imminent or life-threatening deterioration.  Critical care was time spent personally by me on the following activities: development of treatment plan with patient and/or surrogate as well as nursing, discussions with consultants, evaluation of patient's response to treatment, examination of patient, obtaining history from patient or surrogate, ordering and performing treatments and interventions, ordering and review of laboratory studies, ordering and review of radiographic studies, pulse oximetry and re-evaluation of patient's condition.   Medications Ordered in ED Medications  aspirin chewable tablet 324 mg (324 mg Oral Given 11/23/16 1940)     Initial Impression / Assessment and Plan / ED Course  I have reviewed the triage vital signs and the nursing notes.  Pertinent labs & imaging results that were available during my care of the patient were reviewed by me and considered in my medical decision making (see chart for details).  Clinical Course as of Nov 23 2224  Thu Nov 23, 2016  1912 Trop, BNP elevated. Dimer is neg. Troponin i, poc: (!!) 0.87 [AN]  2225 Cardiology service has seen the patient. They think there are too many other medical issues going on without any acute cardiac disease that needs emergent intervention therefore it would be better to admit patient to medicine.  Results from the ER workup discussed with the patient face to face and all questions answered to the best of my ability.  Troponin I: (!!) 1.13 [AN]    Clinical Course User Index [AN] Varney Biles, MD    Patient comes in with multiple complaints. Patient has several medical comorbidities. He is complaining of worsening shortness of breath, orthopnea,  dizziness  that's new. Patient has been feeling unwell for several weeks now.  I think patient likely has an upper respiratory infection with viral etiology that's going on. However the viral URI does not explain the worsening shortness of breath, orthopnea and + JVD, O2 sats of 92% on RA, so I am afraid that he might be having some fluid overload due to his CHF. Patient was just seen by the cardiologist and deemed as euvolemic. Patient has history of DVT, A. fib and he is on Coumadin, but I have to expand the differential to PE. Ultrasound DVT has been ordered for bilateral upper extremities. Additionally, shortness of breath could be angina equivalent. EKG does have some nonspecific changes.  Final Clinical Impressions(s) / ED Diagnoses   Final diagnoses:  NSTEMI (non-ST elevated myocardial infarction) ([place])  Acute congestive heart failure, unspecified heart failure type ([place])  Acute and chronic respiratory failure with hypoxia [facility])    New Prescriptions New Prescriptions   No medications on file     Varney Biles, MD 11/23/16 [place], [place], MD 11/23/16 2226

## 2016-11-24 ENCOUNTER — Encounter (HOSPITAL_COMMUNITY): Payer: Self-pay

## 2016-11-24 ENCOUNTER — Observation Stay (HOSPITAL_BASED_OUTPATIENT_CLINIC_OR_DEPARTMENT_OTHER): Payer: Managed Care, Other (non HMO)

## 2016-11-24 ENCOUNTER — Observation Stay (HOSPITAL_COMMUNITY): Payer: Managed Care, Other (non HMO)

## 2016-11-24 DIAGNOSIS — E871 Hypo-osmolality and hyponatremia: Secondary | ICD-10-CM | POA: Diagnosis present

## 2016-11-24 DIAGNOSIS — Z86718 Personal history of other venous thrombosis and embolism: Secondary | ICD-10-CM

## 2016-11-24 DIAGNOSIS — I5031 Acute diastolic (congestive) heart failure: Secondary | ICD-10-CM | POA: Diagnosis not present

## 2016-11-24 DIAGNOSIS — E1142 Type 2 diabetes mellitus with diabetic polyneuropathy: Secondary | ICD-10-CM | POA: Diagnosis present

## 2016-11-24 DIAGNOSIS — I361 Nonrheumatic tricuspid (valve) insufficiency: Secondary | ICD-10-CM | POA: Diagnosis not present

## 2016-11-24 DIAGNOSIS — N183 Chronic kidney disease, stage 3 (moderate): Secondary | ICD-10-CM | POA: Diagnosis present

## 2016-11-24 DIAGNOSIS — I509 Heart failure, unspecified: Secondary | ICD-10-CM

## 2016-11-24 DIAGNOSIS — T25022A Burn of unspecified degree of left foot, initial encounter: Secondary | ICD-10-CM | POA: Diagnosis present

## 2016-11-24 DIAGNOSIS — I248 Other forms of acute ischemic heart disease: Secondary | ICD-10-CM | POA: Diagnosis present

## 2016-11-24 DIAGNOSIS — J449 Chronic obstructive pulmonary disease, unspecified: Secondary | ICD-10-CM | POA: Diagnosis present

## 2016-11-24 DIAGNOSIS — J9621 Acute and chronic respiratory failure with hypoxia: Secondary | ICD-10-CM | POA: Diagnosis present

## 2016-11-24 DIAGNOSIS — L03116 Cellulitis of left lower limb: Secondary | ICD-10-CM | POA: Diagnosis present

## 2016-11-24 DIAGNOSIS — E1122 Type 2 diabetes mellitus with diabetic chronic kidney disease: Secondary | ICD-10-CM | POA: Diagnosis present

## 2016-11-24 DIAGNOSIS — I5033 Acute on chronic diastolic (congestive) heart failure: Secondary | ICD-10-CM

## 2016-11-24 DIAGNOSIS — K219 Gastro-esophageal reflux disease without esophagitis: Secondary | ICD-10-CM | POA: Diagnosis present

## 2016-11-24 DIAGNOSIS — E1165 Type 2 diabetes mellitus with hyperglycemia: Secondary | ICD-10-CM | POA: Diagnosis present

## 2016-11-24 DIAGNOSIS — R791 Abnormal coagulation profile: Secondary | ICD-10-CM | POA: Diagnosis present

## 2016-11-24 DIAGNOSIS — R748 Abnormal levels of other serum enzymes: Secondary | ICD-10-CM

## 2016-11-24 DIAGNOSIS — E11610 Type 2 diabetes mellitus with diabetic neuropathic arthropathy: Secondary | ICD-10-CM | POA: Diagnosis present

## 2016-11-24 DIAGNOSIS — I2510 Atherosclerotic heart disease of native coronary artery without angina pectoris: Secondary | ICD-10-CM | POA: Diagnosis present

## 2016-11-24 DIAGNOSIS — E785 Hyperlipidemia, unspecified: Secondary | ICD-10-CM | POA: Diagnosis present

## 2016-11-24 DIAGNOSIS — Z86711 Personal history of pulmonary embolism: Secondary | ICD-10-CM

## 2016-11-24 DIAGNOSIS — I214 Non-ST elevation (NSTEMI) myocardial infarction: Secondary | ICD-10-CM | POA: Diagnosis present

## 2016-11-24 DIAGNOSIS — X088XXA Exposure to other specified smoke, fire and flames, initial encounter: Secondary | ICD-10-CM | POA: Diagnosis present

## 2016-11-24 DIAGNOSIS — I272 Pulmonary hypertension, unspecified: Secondary | ICD-10-CM | POA: Diagnosis present

## 2016-11-24 DIAGNOSIS — I110 Hypertensive heart disease with heart failure: Secondary | ICD-10-CM | POA: Diagnosis not present

## 2016-11-24 DIAGNOSIS — J069 Acute upper respiratory infection, unspecified: Secondary | ICD-10-CM | POA: Diagnosis present

## 2016-11-24 DIAGNOSIS — I480 Paroxysmal atrial fibrillation: Secondary | ICD-10-CM

## 2016-11-24 DIAGNOSIS — T45515A Adverse effect of anticoagulants, initial encounter: Secondary | ICD-10-CM | POA: Diagnosis present

## 2016-11-24 DIAGNOSIS — G4733 Obstructive sleep apnea (adult) (pediatric): Secondary | ICD-10-CM | POA: Diagnosis present

## 2016-11-24 DIAGNOSIS — I130 Hypertensive heart and chronic kidney disease with heart failure and stage 1 through stage 4 chronic kidney disease, or unspecified chronic kidney disease: Secondary | ICD-10-CM | POA: Diagnosis present

## 2016-11-24 DIAGNOSIS — E669 Obesity, unspecified: Secondary | ICD-10-CM | POA: Diagnosis present

## 2016-11-24 DIAGNOSIS — Z7901 Long term (current) use of anticoagulants: Secondary | ICD-10-CM | POA: Diagnosis not present

## 2016-11-24 LAB — VAS US LOWER EXTREMITY ARTERIAL DUPLEX
RIGHT ANT MID TIBIAL DIA PSV: -10 cm/s
RIGHT ANT MID TIBIAL SYS PSV: -40 cm/s
RIGHT ANT TIBIAL EDV: -11 cm/s
RIGHT PERO MID DIA: 24 cm/s
RIGHT PERO MID SYS: 87 cm/s
RIGHT POPLITEAL DIST EDV: -15 cm/s
RIGHT POPLITEAL PROX EDV: 14 cm/s
RIGHT POST TIB MID DIA: 32 cm/s
RIGHT POST TIB MID SYS: 133 cm/s
RIGHT POST TIBIAL EDV: 17 cm/s
RIGHT SUPER FEMORAL DIST EDV: -18 cm/s
RIGHT SUPER FEMORAL MID EDV: -17 cm/s
RIGHT SUPER FEMORAL PROX EDV: -13 cm/s
Right ant tibeal sys PSV: -48 cm/s
Right popliteal dist sys PSV: -96 cm/s
Right popliteal prox sys PSV: 111 cm/s
Right post tibial sys PSV: 78 cm/s
Right super femoral dist sys PSV: -127 cm/s
Right super femoral mid sys PSV: -132 cm/s
Right super femoral prox sys PSV: -115 cm/s

## 2016-11-24 LAB — BASIC METABOLIC PANEL
Anion gap: 14 (ref 5–15)
BUN: 31 mg/dL — ABNORMAL HIGH (ref 6–20)
CO2: 22 mmol/L (ref 22–32)
Calcium: 8.3 mg/dL — ABNORMAL LOW (ref 8.9–10.3)
Chloride: 94 mmol/L — ABNORMAL LOW (ref 101–111)
Creatinine, Ser: 1.41 mg/dL — ABNORMAL HIGH (ref 0.61–1.24)
GFR calc Af Amer: >60 mL/min (ref >60)
GFR calc non Af Amer: 52 mL/min — ABNORMAL LOW (ref >60)
Glucose, Bld: 311 mg/dL — ABNORMAL HIGH (ref 65–99)
Potassium: 4 mmol/L (ref 3.5–5.1)
Sodium: 130 mmol/L — ABNORMAL LOW (ref 135–145)

## 2016-11-24 LAB — COMPREHENSIVE METABOLIC PANEL
ALT: 33 U/L (ref 17–63)
AST: 41 U/L (ref 15–41)
Albumin: 2.8 g/dL — ABNORMAL LOW (ref 3.5–5.0)
Alkaline Phosphatase: 76 U/L (ref 38–126)
Anion gap: 11 (ref 5–15)
BUN: 29 mg/dL — ABNORMAL HIGH (ref 6–20)
CO2: 25 mmol/L (ref 22–32)
Calcium: 8.4 mg/dL — ABNORMAL LOW (ref 8.9–10.3)
Chloride: 94 mmol/L — ABNORMAL LOW (ref 101–111)
Creatinine, Ser: 1.36 mg/dL — ABNORMAL HIGH (ref 0.61–1.24)
GFR calc Af Amer: >60 mL/min (ref >60)
GFR calc non Af Amer: 55 mL/min — ABNORMAL LOW (ref >60)
Glucose, Bld: 323 mg/dL — ABNORMAL HIGH (ref 65–99)
Potassium: 4 mmol/L (ref 3.5–5.1)
Sodium: 130 mmol/L — ABNORMAL LOW (ref 135–145)
Total Bilirubin: 1.5 mg/dL — ABNORMAL HIGH (ref 0.3–1.2)
Total Protein: 6.6 g/dL (ref 6.5–8.1)

## 2016-11-24 LAB — GLUCOSE, CAPILLARY
Glucose-Capillary: 307 mg/dL — ABNORMAL HIGH (ref 65–99)
Glucose-Capillary: 342 mg/dL — ABNORMAL HIGH (ref 65–99)
Glucose-Capillary: 447 mg/dL — ABNORMAL HIGH (ref 65–99)
Glucose-Capillary: 340 mg/dL — ABNORMAL HIGH (ref 65–99)

## 2016-11-24 LAB — ECHOCARDIOGRAM COMPLETE
Height: 72 in
Weight: 4054 oz

## 2016-11-24 LAB — PROTIME-INR
INR: 1.79
Prothrombin Time: 20.7 seconds — ABNORMAL HIGH (ref 11.4–15.2)

## 2016-11-24 LAB — TROPONIN I
Troponin I: 0.62 ng/mL (ref <0.03)
Troponin I: 0.61 ng/mL (ref <0.03)

## 2016-11-24 MED ORDER — TRAZODONE HCL 50 MG PO TABS
50.0000 mg | ORAL_TABLET | Freq: Every evening | ORAL | Status: DC | PRN
Start: 2016-11-24 — End: 2016-11-26
  Administered 2016-11-24 – 2016-11-25 (×2): 50 mg via ORAL
  Filled 2016-11-24 (×2): qty 1

## 2016-11-24 MED ORDER — INSULIN ASPART 100 UNIT/ML ~~LOC~~ SOLN
0.0000 [IU] | Freq: Every day | SUBCUTANEOUS | Status: DC
Start: 2016-11-24 — End: 2016-11-26
  Administered 2016-11-24: 4 [IU] via SUBCUTANEOUS
  Administered 2016-11-24: 5 [IU] via SUBCUTANEOUS
  Administered 2016-11-25: 2 [IU] via SUBCUTANEOUS

## 2016-11-24 MED ORDER — PRO-STAT SUGAR FREE PO LIQD
30.0000 mL | Freq: Two times a day (BID) | ORAL | Status: DC
  Administered 2016-11-25 – 2016-11-26 (×3): 30 mL via ORAL
  Filled 2016-11-24 (×4): qty 30

## 2016-11-24 MED ORDER — PERFLUTREN LIPID MICROSPHERE
INTRAVENOUS | Status: AC
  Administered 2016-11-24: 3 mL
  Filled 2016-11-24: qty 10

## 2016-11-24 MED ORDER — WARFARIN SODIUM 10 MG PO TABS
10.0000 mg | ORAL_TABLET | Freq: Once | ORAL | Status: AC
  Administered 2016-11-24: 10 mg via ORAL
  Filled 2016-11-24: qty 1

## 2016-11-24 MED ORDER — INSULIN ASPART 100 UNIT/ML ~~LOC~~ SOLN
6.0000 [IU] | Freq: Three times a day (TID) | SUBCUTANEOUS | Status: DC
  Administered 2016-11-24 – 2016-11-25 (×4): 6 [IU] via SUBCUTANEOUS

## 2016-11-24 MED ORDER — INSULIN ASPART 100 UNIT/ML ~~LOC~~ SOLN
0.0000 [IU] | Freq: Three times a day (TID) | SUBCUTANEOUS | Status: DC
  Administered 2016-11-24: 15 [IU] via SUBCUTANEOUS
  Administered 2016-11-24: 20 [IU] via SUBCUTANEOUS
  Administered 2016-11-24 – 2016-11-25 (×4): 7 [IU] via SUBCUTANEOUS
  Administered 2016-11-26 (×2): 3 [IU] via SUBCUTANEOUS

## 2016-11-24 MED ORDER — WARFARIN - PHARMACIST DOSING INPATIENT
Freq: Every day | Status: DC
  Administered 2016-11-25 – 2016-11-26 (×2)

## 2016-11-24 MED ORDER — IPRATROPIUM-ALBUTEROL 0.5-2.5 (3) MG/3ML IN SOLN
3.0000 mL | Freq: Two times a day (BID) | RESPIRATORY_TRACT | Status: DC
  Administered 2016-11-24 (×2): 3 mL via RESPIRATORY_TRACT
  Filled 2016-11-24 (×2): qty 3

## 2016-11-24 MED ORDER — INSULIN GLARGINE 100 UNIT/ML ~~LOC~~ SOLN
80.0000 [IU] | Freq: Every evening | SUBCUTANEOUS | Status: DC
  Administered 2016-11-24: 80 [IU] via SUBCUTANEOUS
  Filled 2016-11-24 (×2): qty 0.8

## 2016-11-24 NOTE — Progress Notes (Signed)
  Echocardiogram 2D Echocardiogram has been performed.  Bobby Lindsey 11/24/2016, 10:47 AM

## 2016-11-24 NOTE — Progress Notes (Addendum)
[facility] for warfarin Indication: atrial fibrillation, hx of DVT/PE   Assessment: 89 yom with hx of afib and DVT/PE on warfarin PTA - Pharmacy consulted to resume 10/12 now that not going for LHC. INR supratherapeutic on admission at 4.24 (likely due to doxy), back in therapeutic range at 2.54 yesterday. INR for today pending. CBC wnl. No bleeding documented. Noted continuing on doxycycline - may affect INR.  PTA warfarin dose: 10mg  daily (last dose 10/9 PTA)  Goal of Therapy:  INR 2-3 Monitor platelets by anticoagulation protocol: Yes   Plan:  Warfarin 10mg  PO x 1 dose  Daily INR Monitor CBC, s/sx bleeding, DDI  Elicia Lamp, PharmD, BCPS Clinical Pharmacist 11/24/2016 10:29 AM   ADDENDUM:  INR now subtherapeutic at 1.79. No need to bridge for now per discussion with Dr. Oval Linsey. Will f/u INR trend tomorrow.  Elicia Lamp, PharmD, BCPS Clinical Pharmacist Rx Phone # for today: [phone] After 3:30PM, please call Main Rx: [phone] 11/24/2016 2:27 PM
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
## 2016-11-24 NOTE — Progress Notes (Signed)
Progress Note  Patient Name: Bobby Lindsey Date of Encounter: 11/24/2016  Primary Cardiologist: Dr. Percival Spanish  Subjective   Breathing a little better.  Continues to have a productive cough.   Inpatient Medications    Scheduled Meds: . aspirin EC  81 mg Oral Daily  . doxycycline  100 mg Oral BID  . fluticasone  2 spray Each Nare Daily  . furosemide  80 mg Intravenous BID  . insulin aspart  0-20 Units Subcutaneous TID WC  . insulin aspart  0-5 Units Subcutaneous QHS  . insulin aspart  6 Units Subcutaneous TID WC  . insulin glargine  80 Units Subcutaneous QPM  . ipratropium-albuterol  3 mL Nebulization BID  . isosorbide mononitrate  30 mg Oral Daily  . losartan  100 mg Oral Daily  . metoprolol succinate  12.5 mg Oral Daily  . silver sulfADIAZINE  1 application Topical BID  . sodium chloride flush  3 mL Intravenous Q12H   Continuous Infusions: . sodium chloride     PRN Meds: sodium chloride, acetaminophen, gabapentin, nitroGLYCERIN, ondansetron (ZOFRAN) IV, sodium chloride, sodium chloride flush   Vital Signs    Vitals:   11/23/16 2328 11/24/16 0234 11/24/16 0601 11/24/16 0604  BP: (!) 141/67   (!) 146/73  Pulse: 76 77  83  Resp: 20 18  18   Temp: 99.1 F (37.3 C)   98.6 F (37 C)  TempSrc: Oral   Oral  SpO2: 95% 98%  93%  Weight: 118.3 kg (260 lb 12.8 oz)  114.9 kg (253 lb 6 oz)   Height: 6' (1.829 m)       Intake/Output Summary (Last 24 hours) at 11/24/16 0925 Last data filed at 11/24/16 0622  Gross per 24 hour  Intake              240 ml  Output              900 ml  Net             -660 ml   Filed Weights   11/23/16 2328 11/24/16 0601  Weight: 118.3 kg (260 lb 12.8 oz) 114.9 kg (253 lb 6 oz)    Telemetry    Sinus rhythm.  No events.  - Personally Reviewed  ECG    Sinus rhythm.  Rate 76 bpm.  - Personally Reviewed  Physical Exam   VS:  BP (!) 146/73 (BP Location: Right Arm)   Pulse 83   Temp 98.6 F (37 C) (Oral)   Resp 18   Ht 6' (1.829  m)   Wt 114.9 kg (253 lb 6 oz) Comment: scale a  SpO2 91%   BMI 34.36 kg/m  , BMI Body mass index is 34.36 kg/m. GENERAL:  Well appearing HEENT: Pupils equal round and reactive, fundi not visualized, oral mucosa unremarkable NECK:  No jugular venous distention sitting upright, Carotid upstroke brisk and symmetric, no bruits, no thyromegaly LUNGS:  Bibasilar crackles3 HEART:  RRR.  PMI not displaced or sustained,S1 and S2 within normal limits, no S3, no S4, no clicks, no rubs, no murmurs ABD:  Flat, positive bowel sounds normal in frequency in pitch, no bruits, no rebound, no guarding, no midline pulsatile mass, no hepatomegaly, no splenomegaly EXT:  2 plus pulses throughout, +LLE edema and erythema, no cyanosis no clubbing SKIN:  No rashes no nodules NEURO:  Cranial nerves II through XII grossly intact, motor grossly intact throughout PSYCH:  Cognitively intact, oriented to person place and time  Labs    Chemistry Recent Labs Lab 11/23/16 1706 11/24/16 0424  NA 129* 130*  K 4.3 4.0  CL 95* 94*  CO2 25 22  GLUCOSE 295* 311*  BUN 28* 31*  CREATININE 1.42* 1.41*  CALCIUM 8.5* 8.3*  PROT 6.7  --   ALBUMIN 3.0*  --   AST 43*  --   ALT 33  --   ALKPHOS 127*  --   BILITOT 1.5*  --   GFRNONAA 52* 52*  GFRAA >60 >60  ANIONGAP 9 14     Hematology Recent Labs Lab 11/23/16 1953  WBC 17.0*  RBC 4.58  HGB 13.2  HCT 39.0  MCV 85.2  MCH 28.8  MCHC 33.8  RDW 13.8  PLT 166    Cardiac Enzymes Recent Labs Lab 11/23/16 1740 11/23/16 2207 11/24/16 0424  TROPONINI 1.13* 1.33* 0.62*    Recent Labs Lab 11/23/16 1729  TROPIPOC 0.87*     BNP Recent Labs Lab 11/23/16 1706  BNP 428.6*     DDimer  Recent Labs Lab 11/23/16 1740  DDIMER 0.32     Radiology    Dg Chest 2 View  Result Date: 11/23/2016 CLINICAL DATA:  Cough and fever for 3 weeks question pneumonia, history hypertension, CHF, diabetes mellitus, COPD, prior pulmonary emboli and DVT, atrial  fibrillation EXAM: CHEST  2 VIEW COMPARISON:  11/06/2016 FINDINGS: Enlargement of cardiac silhouette post CABG. Mediastinal contours and pulmonary vascularity normal. Scarring at lingula. Central peribronchial thickening. No infiltrate, pleural effusion or pneumothorax. Bones demineralized. IMPRESSION: Enlargement of cardiac silhouette post CABG. Bronchitic changes with lingular scarring. No acute infiltrate. Electronically Signed   By: Lavonia Dana M.D.   On: 11/23/2016 20:30    Cardiac Studies   Echo pending  Patient Profile     61 y.o. male with CAD s/p CABG (2002) and PCI of RCA, STEMI with DES to LCx (8182), chronic systolic and diastolic heart failure, PAF,  hypertension, hyperlipidemia, DM and OSA admitted with   Assessment & Plan    # CAD s/p CABG and PCI: # Elevated troponin: Likely demand ischemia in the setting of URI and LLE cellulits.  Troponin peaked at 1.33.  He has no chest pain.  Echo pending.  Continue aspirin, metoprolol, losartan, and Imdur.  He has been intolerant of simvastatin.  LDL was 21 08/2016.   # Acute on chronic diastolic heart failure: # Hyponatremia: Mildly volume overloaded. Sodium improving with diuresis and weight is going down (though I'm not sure that 118-->114kg is accurate). Continue lasix, metoprolol and losartan.   # Paroxysmal atrial fibrillaiton: He has not tolerated amiodarone due to dyspnea.  Continue metoprolol.  Warfarin was on hold in case he needs procedures.  He will not need LHC so we will resume his warfarin.   # Hypertension: Continue metoprolol and losartan.   For questions or updates, please contact [facility] Please consult [email or web address] for contact info under Cardiology/STEMI.      Signed, Skeet Latch, MD  11/24/2016, 9:25 AM

## 2016-11-24 NOTE — Telephone Encounter (Signed)
Reached out to patient to follow up with him getting his new mask and was informed by his wife Per DPR (Amy) that the patient did get a new mask that he thought he could wear but got sick and went into the hospital before he could wear it. Amy states she will call the CPAP assistant when he is out of the hospital and wears his mask for a couple of weeks so I can get a download.

## 2016-11-24 NOTE — Progress Notes (Signed)
No new orders, per NP give HS scale. Patient agrees to take insulin. Insulin given per sliding scale.  Will continue to monitor.  Shizuye Rupert, RN

## 2016-11-24 NOTE — Care Management Note (Signed)
Case Management Note  Patient Details  Name: Bobby Lindsey MRN: [id number] Date of Birth: 03/30/1955  Subjective/Objective:     CHF              Action/Plan: Patient lives at home with spouse; PCP: Adin Hector, MD; has private insurance with [employer] with prescription drug coverage; goes to the [facility] for wound care; awaiting on Physical Therapy eval for disposition needs.  Expected Discharge Date:    possibly 11/28/2016              Expected Discharge Plan:  [place]  Discharge planning Services  CM Consult  Status of Service:  In process, will continue to follow  Sherrilyn Rist [phone] 11/24/2016, 12:26 PM

## 2016-11-24 NOTE — Progress Notes (Signed)
Patient blood sugar 372, no HS coverage. Paged NP on call made he aware.  Sliding scale ordered, however patient is refusing to take 5 units of insulin stating that 5 units will not help.  Paged NP [place] and made her aware.  Will continue to monitor.  Dante Roudebush, RN

## 2016-11-24 NOTE — Progress Notes (Signed)
*  PRELIMINARY RESULTS* Vascular Ultrasound Lower Extremity Arterial Duplex has been completed.  Preliminary findings: Triphasic waveforms seen from the left  common femoral to distal popliteal artery.  Monophasic flow seen in the posterior tibial and anterior arteries with  no obvious stenosis identified.  limited viability of calf arteries due to surgical bandages and penetration.  Small bakers cyst noted on the left.  Images throughout exam are mislabeled, this entire exam is of the left lower extremity.  Everrett Coombe 11/24/2016, 12:46 PM
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
## 2016-11-24 NOTE — Progress Notes (Signed)
  Echocardiogram 2D Echocardiogram has been performed.  Bobby Lindsey 11/24/2016, 10:51 AM

## 2016-11-25 DIAGNOSIS — I110 Hypertensive heart disease with heart failure: Secondary | ICD-10-CM

## 2016-11-25 LAB — BASIC METABOLIC PANEL
Anion gap: 10 (ref 5–15)
BUN: 35 mg/dL — ABNORMAL HIGH (ref 6–20)
CO2: 28 mmol/L (ref 22–32)
Calcium: 8.5 mg/dL — ABNORMAL LOW (ref 8.9–10.3)
Chloride: 93 mmol/L — ABNORMAL LOW (ref 101–111)
Creatinine, Ser: 1.35 mg/dL — ABNORMAL HIGH (ref 0.61–1.24)
GFR calc Af Amer: >60 mL/min (ref >60)
GFR calc non Af Amer: 55 mL/min — ABNORMAL LOW (ref >60)
Glucose, Bld: 255 mg/dL — ABNORMAL HIGH (ref 65–99)
Potassium: 3.5 mmol/L (ref 3.5–5.1)
Sodium: 131 mmol/L — ABNORMAL LOW (ref 135–145)

## 2016-11-25 LAB — CBC WITH DIFFERENTIAL/PLATELET
Basophils Absolute: 0 10*3/uL (ref 0.0–0.1)
Basophils Relative: 0 %
Eosinophils Absolute: 0 10*3/uL (ref 0.0–0.7)
Eosinophils Relative: 0 %
HCT: 36.5 % — ABNORMAL LOW (ref 39.0–52.0)
Hemoglobin: 12.3 g/dL — ABNORMAL LOW (ref 13.0–17.0)
Lymphocytes Relative: 15 %
Lymphs Abs: 2.3 10*3/uL (ref 0.7–4.0)
MCH: 28.5 pg (ref 26.0–34.0)
MCHC: 33.7 g/dL (ref 30.0–36.0)
MCV: 84.7 fL (ref 78.0–100.0)
Monocytes Absolute: 1.8 10*3/uL — ABNORMAL HIGH (ref 0.1–1.0)
Monocytes Relative: 12 %
Neutro Abs: 11.3 10*3/uL — ABNORMAL HIGH (ref 1.7–7.7)
Neutrophils Relative %: 73 %
Platelets: 182 10*3/uL (ref 150–400)
RBC: 4.31 MIL/uL (ref 4.22–5.81)
RDW: 13.6 % (ref 11.5–15.5)
WBC: 15.4 10*3/uL — ABNORMAL HIGH (ref 4.0–10.5)

## 2016-11-25 LAB — GLUCOSE, CAPILLARY
Glucose-Capillary: 240 mg/dL — ABNORMAL HIGH (ref 65–99)
Glucose-Capillary: 243 mg/dL — ABNORMAL HIGH (ref 65–99)
Glucose-Capillary: 236 mg/dL — ABNORMAL HIGH (ref 65–99)
Glucose-Capillary: 221 mg/dL — ABNORMAL HIGH (ref 65–99)

## 2016-11-25 LAB — HEMOGLOBIN A1C
Hgb A1c MFr Bld: 9.6 % — ABNORMAL HIGH (ref 4.8–5.6)
Mean Plasma Glucose: 228.82 mg/dL

## 2016-11-25 LAB — PROTIME-INR
INR: 1.85
Prothrombin Time: 21.2 seconds — ABNORMAL HIGH (ref 11.4–15.2)

## 2016-11-25 LAB — MAGNESIUM: Magnesium: 2 mg/dL (ref 1.7–2.4)

## 2016-11-25 MED ORDER — POTASSIUM CHLORIDE CRYS ER 20 MEQ PO TBCR
40.0000 meq | EXTENDED_RELEASE_TABLET | Freq: Once | ORAL | Status: AC
  Administered 2016-11-25: 40 meq via ORAL
  Filled 2016-11-25: qty 2

## 2016-11-25 MED ORDER — WARFARIN SODIUM 10 MG PO TABS
10.0000 mg | ORAL_TABLET | Freq: Once | ORAL | Status: AC
  Administered 2016-11-25: 10 mg via ORAL
  Filled 2016-11-25: qty 1

## 2016-11-25 MED ORDER — INSULIN ASPART 100 UNIT/ML ~~LOC~~ SOLN
10.0000 [IU] | Freq: Three times a day (TID) | SUBCUTANEOUS | Status: DC
  Administered 2016-11-25 – 2016-11-26 (×4): 10 [IU] via SUBCUTANEOUS

## 2016-11-25 MED ORDER — METOCLOPRAMIDE HCL 5 MG/ML IJ SOLN
5.0000 mg | Freq: Once | INTRAMUSCULAR | Status: AC
  Administered 2016-11-25: 5 mg via INTRAVENOUS
  Filled 2016-11-25: qty 2

## 2016-11-25 MED ORDER — IPRATROPIUM-ALBUTEROL 0.5-2.5 (3) MG/3ML IN SOLN: 3.0000 mL | RESPIRATORY_TRACT | Status: DC | PRN

## 2016-11-25 MED ORDER — INSULIN GLARGINE 100 UNIT/ML ~~LOC~~ SOLN
90.0000 [IU] | Freq: Every evening | SUBCUTANEOUS | Status: DC
  Administered 2016-11-25 – 2016-11-26 (×2): 90 [IU] via SUBCUTANEOUS
  Filled 2016-11-25 (×2): qty 0.9

## 2016-11-25 NOTE — Progress Notes (Signed)
Per CCMD patient converted back to AFib at shift change. Informed day shift nurse. Will follow up with MD for verification.  Halston Kintz, RN
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
## 2016-11-25 NOTE — Progress Notes (Addendum)
Progress Note  Patient Name: Bobby Lindsey Date of Encounter: 11/25/2016  Primary Cardiologist: Dr. Percival Spanish  Subjective   He feels slightly better today.  Inpatient Medications    Scheduled Meds: . aspirin EC  81 mg Oral Daily  . doxycycline  100 mg Oral BID  . feeding supplement (PRO-STAT SUGAR FREE 64)  30 mL Oral BID  . fluticasone  2 spray Each Nare Daily  . furosemide  80 mg Intravenous BID  . insulin aspart  0-20 Units Subcutaneous TID WC  . insulin aspart  0-5 Units Subcutaneous QHS  . insulin aspart  6 Units Subcutaneous TID WC  . insulin glargine  80 Units Subcutaneous QPM  . isosorbide mononitrate  30 mg Oral Daily  . losartan  100 mg Oral Daily  . metoprolol succinate  12.5 mg Oral Daily  . silver sulfADIAZINE  1 application Topical BID  . sodium chloride flush  3 mL Intravenous Q12H  . Warfarin - Pharmacist Dosing Inpatient   Does not apply q1800   Continuous Infusions: . sodium chloride     PRN Meds: sodium chloride, acetaminophen, gabapentin, ipratropium-albuterol, nitroGLYCERIN, ondansetron (ZOFRAN) IV, sodium chloride, sodium chloride flush, traZODone   Vital Signs    Vitals:   11/24/16 2000 11/24/16 2027 11/25/16 0005 11/25/16 0606  BP: 131/68  129/65 134/80  Pulse: 74 85 75 88  Resp: 18 18 18 18   Temp: 99.4 F (37.4 C)  98.9 F (37.2 C) 98 F (36.7 C)  TempSrc: Oral  Oral Oral  SpO2: 95% 95% 94% 97%  Weight:    255 lb 6.4 oz (115.8 kg)  Height:        Intake/Output Summary (Last 24 hours) at 11/25/16 0919 Last data filed at 11/25/16 0643  Gross per 24 hour  Intake              480 ml  Output             1300 ml  Net             -820 ml   Filed Weights   11/23/16 2328 11/24/16 0601 11/25/16 0606  Weight: 260 lb 12.8 oz (118.3 kg) 253 lb 6 oz (114.9 kg) 255 lb 6.4 oz (115.8 kg)    Telemetry    Sinus rhythm.  No events.  - Personally Reviewed  ECG    Sinus rhythm.  Rate 76 bpm.  - Personally Reviewed  Physical Exam   VS:   BP 134/80 (BP Location: Right Arm)   Pulse 88   Temp 98 F (36.7 C) (Oral)   Resp 18   Ht 6' (1.829 m)   Wt 255 lb 6.4 oz (115.8 kg)   SpO2 97%   BMI 34.64 kg/m  , BMI Body mass index is 34.64 kg/m. GENERAL:  Well appearing HEENT: Pupils equal round and reactive, fundi not visualized, oral mucosa unremarkable NECK:  No jugular venous distention sitting upright, Carotid upstroke brisk and symmetric, no bruits, no thyromegaly LUNGS:  Bibasilar crackles3 HEART:  RRR.  PMI not displaced or sustained,S1 and S2 within normal limits, no S3, no S4, no clicks, no rubs, no murmurs ABD:  Flat, positive bowel sounds normal in frequency in pitch, no bruits, no rebound, no guarding, no midline pulsatile mass, no hepatomegaly, no splenomegaly EXT:  2 plus pulses throughout, +LLE edema and erythema Left> right, no cyanosis no clubbing SKIN:  No rashes no nodules NEURO:  Cranial nerves II through XII grossly intact, motor  grossly intact throughout [facility]:  Cognitively intact, oriented to person place and time   Labs    Chemistry  Recent Labs Lab 11/23/16 1706 11/24/16 0424 11/24/16 0941 11/25/16 0400  NA 129* 130* 130* 131*  K 4.3 4.0 4.0 3.5  CL 95* 94* 94* 93*  CO2 25 22 25 28   GLUCOSE 295* 311* 323* 255*  BUN 28* 31* 29* 35*  CREATININE 1.42* 1.41* 1.36* 1.35*  CALCIUM 8.5* 8.3* 8.4* 8.5*  PROT 6.7  --  6.6  --   ALBUMIN 3.0*  --  2.8*  --   AST 43*  --  41  --   ALT 33  --  33  --   ALKPHOS 127*  --  76  --   BILITOT 1.5*  --  1.5*  --   GFRNONAA 52* 52* 55* 55*  GFRAA >60 >60 >60 >60  ANIONGAP 9 14 11 10      Hematology  Recent Labs Lab 11/23/16 1953 11/25/16 0400  WBC 17.0* 15.4*  RBC 4.58 4.31  HGB 13.2 12.3*  HCT 39.0 36.5*  MCV 85.2 84.7  MCH 28.8 28.5  MCHC 33.8 33.7  RDW 13.8 13.6  PLT 166 182    Cardiac Enzymes  Recent Labs Lab 11/23/16 1740 11/23/16 2207 11/24/16 0424 11/24/16 0941  TROPONINI 1.13* 1.33* 0.62* 0.61*     Recent Labs Lab  11/23/16 1729  TROPIPOC 0.87*     BNP  Recent Labs Lab 11/23/16 1706  BNP 428.6*     DDimer   Recent Labs Lab 11/23/16 1740  DDIMER 0.32     Radiology    Dg Chest 2 View  Result Date: 11/23/2016 CLINICAL DATA:  Cough and fever for 3 weeks question pneumonia, history hypertension, CHF, diabetes mellitus, COPD, prior pulmonary emboli and DVT, atrial fibrillation EXAM: CHEST  2 VIEW COMPARISON:  11/06/2016 FINDINGS: Enlargement of cardiac silhouette post CABG. Mediastinal contours and pulmonary vascularity normal. Scarring at lingula. Central peribronchial thickening. No infiltrate, pleural effusion or pneumothorax. Bones demineralized. IMPRESSION: Enlargement of cardiac silhouette post CABG. Bronchitic changes with lingular scarring. No acute infiltrate. Electronically Signed   By: Lavonia Dana M.D.   On: 11/23/2016 20:30   Cardiac Studies   TTE: 11/24/2016 - Left ventricle: The cavity size was normal. There was mild focal   basal hypertrophy of the septum. Systolic function was normal.   The estimated ejection fraction was in the range of 55% to 60%.   Wall motion was normal; there were no regional wall motion   abnormalities. Left ventricular diastolic function parameters   were normal. - Mitral valve: Calcified annulus. - Left atrium: The atrium was severely dilated. - Pulmonary arteries: Systolic pressure was mildly increased. PA   peak pressure: 35 mm Hg (S).    Patient Profile     61 y.o. male with CAD s/p CABG (2002) and PCI of RCA, STEMI with DES to LCx (3785), chronic systolic and diastolic heart failure, PAF,  hypertension, hyperlipidemia, DM and OSA admitted with   Assessment & Plan    # CAD s/p CABG and PCI: # Elevated troponin: Likely demand ischemia in the setting of URI and LLE cellulits.  Troponin peaked at 1.33-->0.62.  He has no chest pain.  Echo shows LVEF 55-60%, no regional wall motion abnormalities. Continue aspirin, metoprolol, losartan, and  Imdur.  He has been intolerant of simvastatin.  LDL was 21 08/2016.   # Acute on chronic diastolic heart failure: # Hyponatremia: Still volume overloaded. Sodium  improving with diuresis and weight is going down 118-->115 kg  Continue lasix, metoprolol and losartan. Crea is stable 1.4->1.3. I will replace potassium.   # Paroxysmal atrial fibrillation: He has not tolerated amiodarone due to dyspnea.  Continue metoprolol.  Warfarin was on hold in case he needs procedures.  He will not need LHC so we will resume his warfarin.   # Hypertension: Continue metoprolol and losartan.  # DM - the patient needs more tight diabetes control,   For questions or updates, please contact [place] Please consult [email or web address] for contact info under Cardiology/STEMI.    Signed, Ena Dawley, MD  11/25/2016, 9:19 AM

## 2016-11-25 NOTE — Progress Notes (Signed)
[place] for warfarin Indication: atrial fibrillation, hx of DVT/PE   Assessment: 89 yom with hx of afib and DVT/PE on warfarin PTA - Pharmacy consulted to resume 10/12 now that not going for LHC. INR supratherapeutic on admission at 4.24 (likely due to doxy). INR trended back down off warfarin, now up slightly to 1.85 after 1 dose resumed. CBC wnl. No bleeding documented. Noted continuing on doxycycline - may affect INR.  Spoke with MD 10/12 - no need to bridge at this time  PTA warfarin dose: 10mg  daily (last dose 10/9 PTA)  Goal of Therapy:  INR 2-3 Monitor platelets by anticoagulation protocol: Yes   Plan:  Warfarin 10mg  PO x 1 dose  Daily INR Monitor CBC, s/sx bleeding, DDI  Elicia Lamp, PharmD, BCPS Clinical Pharmacist 11/25/2016 11:41 AM

## 2016-11-25 NOTE — Progress Notes (Signed)
Pt is stable, vitals stable, no any complain of pain, Dressing change done on L leg and Silvadene cream applied, pt tolerated well, will continue to monitor
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
## 2016-11-26 DIAGNOSIS — E785 Hyperlipidemia, unspecified: Secondary | ICD-10-CM

## 2016-11-26 DIAGNOSIS — E1165 Type 2 diabetes mellitus with hyperglycemia: Secondary | ICD-10-CM

## 2016-11-26 DIAGNOSIS — E1142 Type 2 diabetes mellitus with diabetic polyneuropathy: Secondary | ICD-10-CM

## 2016-11-26 LAB — MAGNESIUM: Magnesium: 2 mg/dL (ref 1.7–2.4)

## 2016-11-26 LAB — GLUCOSE, CAPILLARY
Glucose-Capillary: 81 mg/dL (ref 65–99)
Glucose-Capillary: 126 mg/dL — ABNORMAL HIGH (ref 65–99)
Glucose-Capillary: 144 mg/dL — ABNORMAL HIGH (ref 65–99)

## 2016-11-26 LAB — BASIC METABOLIC PANEL
Anion gap: 9 (ref 5–15)
BUN: 46 mg/dL — ABNORMAL HIGH (ref 6–20)
CO2: 26 mmol/L (ref 22–32)
Calcium: 8.4 mg/dL — ABNORMAL LOW (ref 8.9–10.3)
Chloride: 98 mmol/L — ABNORMAL LOW (ref 101–111)
Creatinine, Ser: 1.31 mg/dL — ABNORMAL HIGH (ref 0.61–1.24)
GFR calc Af Amer: >60 mL/min (ref >60)
GFR calc non Af Amer: 57 mL/min — ABNORMAL LOW (ref >60)
Glucose, Bld: 64 mg/dL — ABNORMAL LOW (ref 65–99)
Potassium: 4.1 mmol/L (ref 3.5–5.1)
Sodium: 133 mmol/L — ABNORMAL LOW (ref 135–145)

## 2016-11-26 LAB — PROTIME-INR
INR: 2.22
Prothrombin Time: 24.5 seconds — ABNORMAL HIGH (ref 11.4–15.2)

## 2016-11-26 MED ORDER — CHLORPROMAZINE HCL 25 MG PO TABS: 25.0000 mg | ORAL_TABLET | Freq: Three times a day (TID) | ORAL | 0 refills | Status: DC | PRN

## 2016-11-26 MED ORDER — WARFARIN SODIUM 5 MG PO TABS
5.0000 mg | ORAL_TABLET | Freq: Once | ORAL | Status: AC
  Administered 2016-11-26: 5 mg via ORAL
  Filled 2016-11-26: qty 1

## 2016-11-26 MED ORDER — CHLORPROMAZINE HCL 25 MG PO TABS
25.0000 mg | ORAL_TABLET | Freq: Four times a day (QID) | ORAL | Status: DC | PRN
  Administered 2016-11-26: 25 mg via ORAL
  Filled 2016-11-26 (×2): qty 1

## 2016-11-26 MED ORDER — ASPIRIN 81 MG PO TBEC: 81.0000 mg | DELAYED_RELEASE_TABLET | Freq: Every day | ORAL | 0 refills | Status: DC

## 2016-11-26 MED ORDER — TRAZODONE HCL 50 MG PO TABS: 50.0000 mg | ORAL_TABLET | Freq: Every evening | ORAL | 0 refills | Status: DC | PRN

## 2016-11-26 MED ORDER — WARFARIN SODIUM 5 MG PO TABS: 5.0000 mg | ORAL_TABLET | Freq: Once | ORAL | 0 refills | Status: DC

## 2016-11-26 NOTE — Progress Notes (Addendum)
Pt's wife came to the unit to pick him up at 6.30pm, and said that PT told them to give walker but at this time RN is unable to keep in touch with case manager. In discharge order Rn saw that discharge to home, but not home with home health. RN called MD and verify. MD also told RN to touch base with case manager regarding walker since it is too late today.  RN will leave the note via charge nurse to case Manager to supply walker to the patient, pt's wife said that she will also call up on the unit regarding this.  Pt's wife requested RN to give them the report for ABI index, RN provided after asking to MD because they requested to MD prior to RN and MD told RN to give them

## 2016-11-26 NOTE — Progress Notes (Signed)
[place] for warfarin Indication: atrial fibrillation, hx of DVT/PE   Assessment: 46 yom with hx of afib and DVT/PE on warfarin PTA - Pharmacy consulted to resume 10/12 now that not going for LHC. INR supratherapeutic on admission at 4.24 (likely due to doxy). INR trended back down off warfarin, now up to 2.22 after 2 home doses resumed. CBC wnl. No bleeding documented. Noted continuing on doxycycline through 10/23 - may affect INR.  PTA warfarin dose: 10mg  daily (last dose 10/9 PTA)  Goal of Therapy:  INR 2-3 Monitor platelets by anticoagulation protocol: Yes   Plan:  Warfarin 5mg  PO x 1 dose - empirically lower due to expected INR bump on doxy Daily INR Monitor CBC, s/sx bleeding Consider alternate antibiotic choice with DDI with warfarin?  Bobby Lindsey, PharmD, BCPS Clinical Pharmacist 11/26/2016 12:00 PM

## 2016-11-26 NOTE — Progress Notes (Addendum)
Progress Note  Patient Name: Bobby Lindsey Date of Encounter: 11/26/2016  Primary Cardiologist: Dr. Percival Spanish  Subjective   He feels better today, improved SOB, he started to walk with physical therapy.  Inpatient Medications    Scheduled Meds: . aspirin EC  81 mg Oral Daily  . doxycycline  100 mg Oral BID  . feeding supplement (PRO-STAT SUGAR FREE 64)  30 mL Oral BID  . fluticasone  2 spray Each Nare Daily  . furosemide  80 mg Intravenous BID  . insulin aspart  0-20 Units Subcutaneous TID WC  . insulin aspart  0-5 Units Subcutaneous QHS  . insulin aspart  10 Units Subcutaneous TID WC  . insulin glargine  90 Units Subcutaneous QPM  . isosorbide mononitrate  30 mg Oral Daily  . losartan  100 mg Oral Daily  . metoprolol succinate  12.5 mg Oral Daily  . silver sulfADIAZINE  1 application Topical BID  . sodium chloride flush  3 mL Intravenous Q12H  . Warfarin - Pharmacist Dosing Inpatient   Does not apply q1800   Continuous Infusions: . sodium chloride     PRN Meds: sodium chloride, acetaminophen, chlorproMAZINE, gabapentin, ipratropium-albuterol, nitroGLYCERIN, ondansetron (ZOFRAN) IV, sodium chloride, sodium chloride flush, traZODone   Vital Signs    Vitals:   11/25/16 0900 11/25/16 1500 11/25/16 2043 11/26/16 0606  BP:  113/64 101/87 116/69  Pulse: 95 79 94 83  Resp:  18 18 18   Temp:  98.2 F (36.8 C) 98.2 F (36.8 C) 99 F (37.2 C)  TempSrc:  Oral Oral Oral  SpO2:  97% 98% 94%  Weight:    256 lb 8 oz (116.3 kg)  Height:        Intake/Output Summary (Last 24 hours) at 11/26/16 0857 Last data filed at 11/26/16 0630  Gross per 24 hour  Intake              720 ml  Output             1850 ml  Net            -1130 ml   Filed Weights   11/24/16 0601 11/25/16 0606 11/26/16 0606  Weight: 253 lb 6 oz (114.9 kg) 255 lb 6.4 oz (115.8 kg) 256 lb 8 oz (116.3 kg)   Telemetry    Sinus rhythm.  No events.  - Personally Reviewed  ECG    Sinus rhythm.  Rate 76  bpm.  - Personally Reviewed  Physical Exam   VS:  BP 116/69 (BP Location: Left Arm)   Pulse 83   Temp 99 F (37.2 C) (Oral)   Resp 18   Ht 6' (1.829 m)   Wt 256 lb 8 oz (116.3 kg)   SpO2 94%   BMI 34.79 kg/m  , BMI Body mass index is 34.79 kg/m. GENERAL:  Well appearing HEENT: Pupils equal round and reactive, fundi not visualized, oral mucosa unremarkable NECK:  No jugular venous distention sitting upright, Carotid upstroke brisk and symmetric, no bruits, no thyromegaly LUNGS:  Bibasilar crackles3 HEART:  RRR.  PMI not displaced or sustained,S1 and S2 within normal limits, no S3, no S4, no clicks, no rubs, no murmurs ABD:  Flat, positive bowel sounds normal in frequency in pitch, no bruits, no rebound, no guarding, no midline pulsatile mass, no hepatomegaly, no splenomegaly EXT:  2 plus pulses throughout, +LLE edema and erythema Left> right, no cyanosis no clubbing SKIN:  No rashes no nodules NEURO:  Cranial  nerves II through XII grossly intact, motor grossly intact throughout [facility]:  Cognitively intact, oriented to person place and time  Labs    Chemistry  Recent Labs Lab 11/23/16 1706  11/24/16 0941 11/25/16 0400 11/26/16 0352  NA 129*  < > 130* 131* 133*  K 4.3  < > 4.0 3.5 4.1  CL 95*  < > 94* 93* 98*  CO2 25  < > 25 28 26   GLUCOSE 295*  < > 323* 255* 64*  BUN 28*  < > 29* 35* 46*  CREATININE 1.42*  < > 1.36* 1.35* 1.31*  CALCIUM 8.5*  < > 8.4* 8.5* 8.4*  PROT 6.7  --  6.6  --   --   ALBUMIN 3.0*  --  2.8*  --   --   AST 43*  --  41  --   --   ALT 33  --  33  --   --   ALKPHOS 127*  --  76  --   --   BILITOT 1.5*  --  1.5*  --   --   GFRNONAA 52*  < > 55* 55* 57*  GFRAA >60  < > >60 >60 >60  ANIONGAP 9  < > 11 10 9   < > = values in this interval not displayed.   Hematology  Recent Labs Lab 11/23/16 1953 11/25/16 0400  WBC 17.0* 15.4*  RBC 4.58 4.31  HGB 13.2 12.3*  HCT 39.0 36.5*  MCV 85.2 84.7  MCH 28.8 28.5  MCHC 33.8 33.7  RDW 13.8 13.6  PLT  166 182   Cardiac Enzymes  Recent Labs Lab 11/23/16 1740 11/23/16 2207 11/24/16 0424 11/24/16 0941  TROPONINI 1.13* 1.33* 0.62* 0.61*     Recent Labs Lab 11/23/16 1729  TROPIPOC 0.87*    BNP  Recent Labs Lab 11/23/16 1706  BNP 428.6*    DDimer   Recent Labs Lab 11/23/16 1740  DDIMER 0.32     Radiology    No results found. Cardiac Studies   TTE: 11/24/2016 - Left ventricle: The cavity size was normal. There was mild focal   basal hypertrophy of the septum. Systolic function was normal.   The estimated ejection fraction was in the range of 55% to 60%.   Wall motion was normal; there were no regional wall motion   abnormalities. Left ventricular diastolic function parameters   were normal. - Mitral valve: Calcified annulus. - Left atrium: The atrium was severely dilated. - Pulmonary arteries: Systolic pressure was mildly increased. PA   peak pressure: 35 mm Hg (S).   Patient Profile     61 y.o. male with CAD s/p CABG (2002) and PCI of RCA, STEMI with DES to LCx (6712), chronic systolic and diastolic heart failure, PAF,  hypertension, hyperlipidemia, DM and OSA admitted with   Assessment & Plan    # CAD s/p CABG and PCI: # Elevated troponin: Likely demand ischemia in the setting of URI and LLE cellulits.  Troponin peaked at 1.33-->0.62.  He has no chest pain.  Echo shows LVEF 55-60%, no regional wall motion abnormalities. Continue aspirin, metoprolol, losartan, and Imdur.  He has been intolerant of simvastatin.  LDL was 21 08/2016.   # Acute on chronic diastolic heart failure: Minimal volume overloaded, significantly improved. Sodium improving with diuresis and weight is going down 118-->115 kg  Continue lasix, metoprolol and losartan. Crea is stable 1.4->1.3. K now 4.1.  # Paroxysmal atrial fibrillation: He has not tolerated amiodarone due  to dyspnea.  Continue metoprolol.  Warfarin was on hold in case he needs procedures.  He will not need LHC so we will  resume his warfarin. INR 2.2 today.  # Hypertension: Continue metoprolol and losartan.  # DM - the patient needs more tight diabetes control,   The patient could be discharged from cardiac standpoint, please discharge on Lasix 80 mg po daily. We will arrange for a follow up with Dr Percival Spanish.   For questions or updates, please contact [place] Please consult [email or web address] for contact info under Cardiology/STEMI.    Signed, Ena Dawley, MD  11/26/2016, 8:57 AM

## 2016-11-26 NOTE — Progress Notes (Signed)
Thorazine ordered and given for hiccoughs. Medication effective. Patient slept for rest of the night, with no complaints or concerns.  Will continue to monitor.  Elier Zellars, RN
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
## 2016-11-26 NOTE — Progress Notes (Addendum)
Patient complained of hiccougs. NP on call paged and Reglan IV ordered.  Patient currently up, stating that he cannot sleep and that he is having a hard time breathing because of hiccoughs. Stated that [place] worked for him, but hiccoughs started again.   Paged NP on call for additional medication. Awaiting on possible order.  Eula Mazzola, RN

## 2016-11-26 NOTE — Care Management Note (Signed)
Case Management Note Original Note Created Sherrilyn Rist [phone] 11/24/2016, 12:26 PM  Patient Details  Name: Bobby Lindsey MRN: [id number] Date of Birth: 06-30-1955  Subjective/Objective:     CHF              Action/Plan: Patient lives at home with spouse; PCP: Adin Hector, MD; has private insurance with [employer] with prescription drug coverage; goes to the [facility] for wound care; awaiting on Physical Therapy eval for disposition needs.  Expected Discharge Date:  11/26/16  Expected Discharge Plan:  [place]  In-House Referral:     Discharge planning Services  CM Consult  Post Acute Care Choice:  Home Health Choice offered to:  Patient  DME Arranged:  N/A DME Agency:  NA  HH Arranged:  RN, PT [place] Agency:     Status of Service:  Completed, signed off  If discussed at [place] of Stay Meetings, dates discussed:    Discharge Disposition: home/home health   Additional Comments:  11/26/16- 1500- Malajah Oceguera RN, CM- pt for d/c home today- orders for HHRN/PT- spoke with pt at bedside to offer choice for [facility] services- per pt he does not have a preference as long as agency works with his insurance - informed pt that CM would make some TC on his behalf to see if a [place] agency could be found that accepted his [employer] if CM is unable to find an agency then would have to work through his [place] network to find a secure a provider- CM as made multiple phone calls to [place] that provide services to [facility]- no agency has accepted-- have placed call to [place] for assistance in [facility] agency referral- have faxed [place] orders to [place]- at [phone] via [place].    Dahlia Client [place], RN 11/26/2016, 3:58 PM [phone]-- weekend coverage for [facility]
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
## 2016-11-26 NOTE — Progress Notes (Signed)
Pt's discharge papers given and patient showed understanding to it, IV DC, Tele-monitor DC, pt is waiting for his wife to pick him up , evening medicines provided except IV lasix.
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
## 2016-11-26 NOTE — Discharge Summary (Signed)
Pt left via wheelchair at 7.37pm with all of his belongings accompanied with his wife in a stable condition.RN left the note to charge nurse to pass it on to Case Manger regarding walker with patient's wife name and number.  Palma Holter RN

## 2016-11-27 NOTE — Progress Notes (Signed)
Received message for rolling walker; noted [facility] orders but no orders received for RW; since the patient is discharged home Epic will not allow CM to enter orders for RW; CM talked to pt/ spouse and instructed them to call the PCP for a rolling walker; B Pennie Rushing [phone]

## 2016-11-29 ENCOUNTER — Encounter: Payer: Self-pay | Admitting: Physician Assistant

## 2016-11-29 ENCOUNTER — Ambulatory Visit (INDEPENDENT_AMBULATORY_CARE_PROVIDER_SITE_OTHER): Payer: Managed Care, Other (non HMO) | Admitting: Pharmacist Clinician (PhC)/ Clinical Pharmacy Specialist

## 2016-11-29 ENCOUNTER — Ambulatory Visit (INDEPENDENT_AMBULATORY_CARE_PROVIDER_SITE_OTHER): Payer: Managed Care, Other (non HMO) | Admitting: Physician Assistant

## 2016-11-29 VITALS — BP 100/63 | HR 71 | Ht 72.0 in | Wt 261.8 lb

## 2016-11-29 DIAGNOSIS — Z86718 Personal history of other venous thrombosis and embolism: Secondary | ICD-10-CM

## 2016-11-29 DIAGNOSIS — I2119 ST elevation (STEMI) myocardial infarction involving other coronary artery of inferior wall: Secondary | ICD-10-CM

## 2016-11-29 DIAGNOSIS — I82409 Acute embolism and thrombosis of unspecified deep veins of unspecified lower extremity: Secondary | ICD-10-CM | POA: Diagnosis not present

## 2016-11-29 DIAGNOSIS — Z794 Long term (current) use of insulin: Secondary | ICD-10-CM | POA: Diagnosis not present

## 2016-11-29 DIAGNOSIS — I5032 Chronic diastolic (congestive) heart failure: Secondary | ICD-10-CM

## 2016-11-29 DIAGNOSIS — I480 Paroxysmal atrial fibrillation: Secondary | ICD-10-CM

## 2016-11-29 DIAGNOSIS — I10 Essential (primary) hypertension: Secondary | ICD-10-CM | POA: Diagnosis not present

## 2016-11-29 DIAGNOSIS — E119 Type 2 diabetes mellitus without complications: Secondary | ICD-10-CM | POA: Diagnosis not present

## 2016-11-29 DIAGNOSIS — Z79899 Other long term (current) drug therapy: Secondary | ICD-10-CM | POA: Diagnosis not present

## 2016-11-29 DIAGNOSIS — E785 Hyperlipidemia, unspecified: Secondary | ICD-10-CM | POA: Diagnosis not present

## 2016-11-29 DIAGNOSIS — Z7901 Long term (current) use of anticoagulants: Secondary | ICD-10-CM

## 2016-11-29 DIAGNOSIS — Z5181 Encounter for therapeutic drug level monitoring: Secondary | ICD-10-CM

## 2016-11-29 DIAGNOSIS — I25810 Atherosclerosis of coronary artery bypass graft(s) without angina pectoris: Secondary | ICD-10-CM | POA: Diagnosis not present

## 2016-11-29 DIAGNOSIS — Z86711 Personal history of pulmonary embolism: Secondary | ICD-10-CM

## 2016-11-29 LAB — POCT INR: INR: 4.4

## 2016-11-29 NOTE — Patient Instructions (Addendum)
Your physician has recommended you make the following change in your medication:  -- increase lasix to 80mg  every morning and 40mg  every afternoon -- decrease losartan to 50mg  daily -- continue lipitor 20mg  daily  Your physician recommends that you return for lab work in [place] recommends that you schedule a follow-up appointment in: [place] with [place], [place]

## 2016-11-29 NOTE — Progress Notes (Signed)
Cardiology Office Note    Date:  12/01/2016   ID:  Bobby Lindsey, DOB 10-Dec-1955, MRN [id number]  PCP:  Adin Hector, MD  Cardiologist:  Dr. Percival Spanish  Chief Complaint  Patient presents with  . Follow-up    seen for Dr. Percival Spanish    History of Present Illness:  Bobby Lindsey is a 61 y.o. male with PMH of CAD s/p CABG 2002 and DES to RCA, recurrent PE/DVT, HTN, HLD, DM and OSA. He was admitted in February 2015 with inferior STEMI and underwent emergent cardiac catheterization. This demonstrated patent SVG to OM1 and LIMA to LAD, occluded SVG to diagonal and SVG to PDA. EF 40% with inferior wall abnormality. Echo showed normal LV EF prior to discharge. Culprit was felt to be occluded native left circumflex which was treated with drug-eluting stent. Hospitalization complicated by transient atrial fibrillation and acute on chronic diastolic heart failure. He has a history of elevated CK and remain off of statin. After she was started on amiodarone for A. fib, he developed Mobitz II AV block, beta blocker was discontinued. He later self discontinued amiodarone thinking it was causing dyspnea. Plavix was discontinued in September 2016. He was last seen by Truitt Merle on 08/15/2016, 6 month follow-up was recommended.  He went to the hospital ED on 09/24/2016 after noticing worsening shortness of breath with exertion and with laying flat. He is currently not wearing CPAP. He also noted 10 pounds weight gain. Interestingly, his BNP was 120.6, despite chest x-ray showed mild vascular congestion and the increased interstitial markings raising concern for mild interstitial edema. He was treated for this CHF exacerbation, his Lasix was increased to 80 mg from 40 mg for the next 5 days. I temporally increase his Lasix to 80 mg twice a day for a few days before decreasing back to 80 mg daily. On my last follow-up in September, he continued to have lower extremity swelling.  More recently, he presented  to the [facility] on 11/23/2017 with shortness of breath and weakness. He denied any chest pain, however troponin was mildly elevated and peaked at 1.33. BNP was 428. Blood cell count of 17. The elevated troponin was felt to be due to demand ischemia in the setting of URI in the left lower extremity cellulitis. He did undergo IV diuresis as well. His discharge weight was 256 pounds. Hemoglobin A1c obtained on 11/25/2016 was 9.6. Repeat echocardiogram obtained on 11/24/2016 showed EF 55-60%, severe LAE, mild focal basal hypertrophy of the septum, PA peak pressure 35 mmHg.  He presents today along with his wife to cardiology office visit. He denies any shortness of breath, however he does have occasional cough and hiccup, I am not entirely sure was causing his symptoms. Given the recent possible acute on chronic diastolic heart failure, I want to do a trial on higher dose of Lasix, I will increase his Lasix from 80 mg daily to 80 mg in a.m. and 40 mg in p.m. His blood pressure is borderline low, I will decrease the losartan to 50 mg daily. He is taking Lipitor 20 mg daily, however it is not documented on the medication list. His Coumadin level was supratherapeutic today, this will need to be managed by Coumadin clinic as outpatient. Aspirin was added during the recent hospitalization due to elevated troponin. We'll continue for now. He will need to have his Coumadin level well controlled.   Past Medical History:  Diagnosis Date  . Atrial fibrillation ([place])  a. Transient during 03/2013 admission.  . Bradycardia    a. Bradycardia/pauses/possible Mobitz II during 03/2013 adm. Not on BB due to this.  Marland Kitchen CAD (coronary artery disease)    a. s/p CABG 2002. b. Hx Cypher stent to the RCA. c. Inf-lat STEMI 03/2013:  LHC (04/05/13):  mLAD occluded, pD1 90, apical br of Dx occluded, CFX occluded, pOM1 90-95, RCA stents patent, diff RCA 30, S-Dx occluded, S-PDA occluded, S-OM1 40-50, L-LAD patent, EF 40% with inf  HK.  PCI:  Promus (2.5 x 28) DES to mid to dist CFX.  Marland Kitchen Charcot's joint of knee   . COPD (chronic obstructive pulmonary disease) ([place])   . Deep venous thrombosis (HCC)    right lower extremity  . Diabetes mellitus    a. A1C 10.7 in 03/2013.  . Diastolic CHF ([place])    a. EF 40% by cath, 55-60% during 03/2013 adm, required IV diuresis.  . DVT, lower extremity, recurrent ([place])    a. Hx recurrent DVT per record.  . Dyslipidemia   . Elevated CK    a. Pt has refused rheum workup in the past.  . GERD (gastroesophageal reflux disease)   . HTN (hypertension)    x 15 years  . Hx of cardiovascular stress test    a. Lexiscan Myoview (03/2010):  diaph atten vs inf scar, no ischemia, EF 47%; Low Risk.  Marland Kitchen Hx of echocardiogram    a. Echo (04/08/13):  Mild LVH, EF 55-60%, restrictive physiology, severe LAE, mild reduced RVSF, mild RAE.  . Leg pain    ABI 6/16:  R 1.2, L 1.1 - normal  . Obesity   . Peripheral neuropathy   . Pulmonary embolism ([place])   . Sleep apnea     Past Surgical History:  Procedure Laterality Date  . CORONARY ANGIOPLASTY WITH STENT PLACEMENT    . CORONARY ARTERY BYPASS GRAFT     4 time since 2002  . LEFT HEART CATHETERIZATION WITH CORONARY/GRAFT ANGIOGRAM  04/05/2013   Procedure: LEFT HEART CATHETERIZATION WITH Beatrix Fetters;  Surgeon: Peter M Martinique, MD;  Location: [facility] CATH LAB;  Service: Cardiovascular;;  . left knee surgery    . PERCUTANEOUS CORONARY STENT INTERVENTION (PCI-S)  04/05/2013   Procedure: PERCUTANEOUS CORONARY STENT INTERVENTION (PCI-S);  Surgeon: Peter M Martinique, MD;  Location: [facility] CATH LAB;  Service: Cardiovascular;;  DES to native Mid cx    Current Medications: Outpatient Medications Prior to Visit  Medication Sig Dispense Refill  . aspirin 81 MG EC tablet Take 1 tablet (81 mg total) by mouth daily. 30 tablet 0  . chlorproMAZINE (THORAZINE) 25 MG tablet Take 1 tablet (25 mg total) by mouth 3 (three) times daily as needed for hiccoughs. 10 tablet 0    . doxycycline (ADOXA) 100 MG tablet Take 100 mg by mouth 2 (two) times daily. Started taking 11/21/2016 for 14 days this is day three    . fluticasone (FLONASE) 50 MCG/ACT nasal spray Place 2 sprays into both nostrils daily. 16 g 0  . gabapentin (NEURONTIN) 300 MG capsule Take 300 mg by mouth as needed (pain in foot and ankle).    . Insulin Degludec (TRESIBA FLEXTOUCH) 200 UNIT/ML SOPN Inject 80 Units into the skin every evening.    . insulin lispro (HUMALOG KWIKPEN) 100 UNIT/ML injection Inject 25 Units into the skin 3 (three) times daily before meals. UAD    . isosorbide mononitrate (IMDUR) 30 MG 24 hr tablet Take 1 tablet (30 mg total) by mouth daily. [place]  tablet 3  . metoprolol succinate (TOPROL-XL) 25 MG 24 hr tablet Take 0.5 tablets (12.5 mg total) by mouth daily. 15 tablet 3  . nitroGLYCERIN (NITROSTAT) 0.4 MG SL tablet Place 1 tablet (0.4 mg total) under the tongue every 5 (five) minutes as needed for chest pain. NEEDS APPOINTMENT 25 tablet 6  . pantoprazole (PROTONIX) 40 MG tablet Take 1 tablet (40 mg total) by mouth as needed (indigestion). (Patient taking differently: Take 20 mg by mouth 2 (two) times daily. ) 30 tablet 11  . silver sulfADIAZINE (SILVADENE) 1 % cream Apply 1 application topically 2 (two) times daily. Use on his left foot    . sodium chloride (OCEAN) 0.65 % SOLN nasal spray Place 1 spray into both nostrils as needed for congestion. 480 mL 0  . TOUJEO SOLOSTAR 300 UNIT/ML SOPN INJECT 88 UNITS SUBCUTANEOUSLY NIGHTLY.  5  . traZODone (DESYREL) 50 MG tablet Take 1 tablet (50 mg total) by mouth at bedtime as needed for sleep. 10 tablet 0  . warfarin (COUMADIN) 5 MG tablet Take 1 tablet (5 mg total) by mouth one time only at 6 PM. 30 tablet 0  . furosemide (LASIX) 80 MG tablet Take 1 tablet (80 mg total) by mouth daily. 30 tablet 3  . losartan (COZAAR) 100 MG tablet Take 1 tablet (100 mg total) by mouth daily. 30 tablet 3  . benzonatate (TESSALON) 100 MG capsule Take 1 capsule  (100 mg total) by mouth 3 (three) times daily as needed for cough. (Patient not taking: Reported on 11/23/2016) 21 capsule 0   No facility-administered medications prior to visit.      Allergies:   Simvastatin   Social History   Social History  . Marital status: Married    Spouse name: N/A  . Number of children: N/A  . Years of education: N/A   Occupational History  . CONSTRUCTION [employer]   Social History Main Topics  . Smoking status: Never Smoker  . Smokeless tobacco: Never Used  . Alcohol use Yes     Comment: rare  . Drug use: No  . Sexual activity: Yes   Other Topics Concern  . 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      Family History:  The patient's family history includes Cancer in his father; Heart disease in his mother; Hypertension in his mother and sister.   ROS:   Please see the history of present illness.    ROS All other systems reviewed and are negative.   PHYSICAL EXAM:   VS:  BP 100/63   Pulse 71   Ht 6' (1.829 m)   Wt 261 lb 12.8 oz (118.8 kg)   BMI 35.51 kg/m    GEN: Well nourished, well developed, in no acute distress  HEENT: normal  Neck: no JVD, carotid bruits, or masses Cardiac: RRR; no murmurs, rubs, or gallops,no edema  Respiratory:  clear to auscultation bilaterally, normal work of breathing GI: soft, nontender, nondistended, + BS MS: no deformity or atrophy  Skin: warm and dry, no rash Neuro:  Alert and Oriented x 3, Strength and sensation are intact Psych: euthymic mood, full affect  Wt Readings from Last 3 Encounters:  11/29/16 261 lb 12.8 oz (118.8 kg)  11/26/16 256 lb 8 oz (116.3 kg)  11/06/16 266 lb (120.7 kg)      Studies/Labs Reviewed:   EKG:  EKG is not ordered today.  Recent Labs: 08/15/2016: TSH 2.120 11/23/2016: B Natriuretic Peptide 428.6 11/24/2016: ALT 33 11/25/2016: Hemoglobin 12.3; Platelets 182 11/26/2016: BUN  46; Creatinine, Ser 1.31; Magnesium 2.0; Potassium 4.1; Sodium 133   Lipid Panel    Component Value Date/Time   CHOL 125 08/15/2016 1509   TRIG 365 (H) 08/15/2016 1509   HDL 31 (L) 08/15/2016 1509   CHOLHDL 4.0 08/15/2016 1509   CHOLHDL 3.6 CALC 03/11/2008 0930   VLDL 21 03/11/2008 0930   LDLCALC 21 08/15/2016 1509    Additional studies/ records that were reviewed today include:   Echo 11/24/2016 LV EF: 55% -   60%  Study Conclusions  - Left ventricle: The cavity size was normal. There was mild focal   basal hypertrophy of the septum. Systolic function was normal.   The estimated ejection fraction was in the range of 55% to 60%.   Wall motion was normal; there were no regional wall motion   abnormalities. Left ventricular diastolic function parameters   were normal. - Mitral valve: Calcified annulus. - Left atrium: The atrium was severely dilated. - Pulmonary arteries: Systolic pressure was mildly increased. PA   peak pressure: 35 mm Hg (S).   ASSESSMENT:    1. Coronary artery disease involving coronary bypass graft of native heart without angina pectoris   2. Medication management   3. Chronic diastolic heart failure ([place])   4. 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 Essential hypertension   6. Hyperlipidemia, unspecified hyperlipidemia type   7. Controlled type 2 diabetes mellitus without complication, with long-term current use of insulin (HCC)      PLAN:  In order of problems listed above:  1. CAD s/p CABG: no obvious chest pain. Due to elevated troponin during recent admission, he was placed on aspirin along with Coumadin.  2. Chronic diastolic heart failure:Recurrent episode of respiratory failure, recently presented to the hospital and diuresed. He was discharged on previous home dose of 80 mg daily of Lasix. I wanted to do a trial of higher maintenance dose of diuretic therapy. I have increased his Lasix to  80 mg a.m. and 40 mg p.m. He will need a basic metabolic panel in one week to assess renal and electrolyte.  3. Recurrent DVT: On Coumadin, INR supratherapeutic.  4. Hypertension: Blood pressure borderline, will decrease losartan to 50 mg daily  5. Hyperlipidemia: He has been taking Lipitor 20 mg daily, even though it is not listed on the medication list. We have updated Epic medication list.  6. DM 2: on Insulin    Medication Adjustments/Labs and Tests Ordered: Current medicines are reviewed at length with the patient today.  Concerns regarding medicines are outlined above.  Medication changes, Labs and Tests ordered today are listed in the Patient Instructions below. Patient Instructions  Your physician has recommended you make the following change in your medication:  -- increase lasix to 80mg  every morning and 40mg  every afternoon -- decrease losartan to 50mg  daily -- continue lipitor 20mg  daily  Your physician recommends that you return for lab work in [place] recommends that you schedule a follow-up appointment in: [place] with [place], [place]        Signed, Almyra Deforest, [place]  12/01/2016 9:01 AM    [address], [place], [place]  [address] Phone: [phone]; Fax: [phone]

## 2016-11-30 ENCOUNTER — Other Ambulatory Visit: Payer: Self-pay | Admitting: Cardiology

## 2016-11-30 NOTE — Progress Notes (Signed)
Received call from patient's spouse stating that no one from the [facility] agency has contacted him; [name] provider through the patient's insurance [name]. Additional information was given and was informed that they will process the information and find a [facility] agency for HHPT/ RN;  CM called patient's spouse [phone]) and informed her that the information is being processed. Bobby Lindsey yelling in the background that his leg was swollen; CM instructed him to call his PCP; spouse stated that he was seen by the Cardiologist yesterday 11/29/2016 and his medication was adjusted. Number to [facility] given to the spouse to call them directly for any questions about HHC. Mindi Slicker RN,MHA,BSN

## 2016-12-01 ENCOUNTER — Encounter: Payer: Self-pay | Admitting: Physician Assistant

## 2016-12-01 NOTE — Progress Notes (Signed)
Received call from [facility] requesting additional information for [facility]; orders faxed to [phone]; Mindi Slicker [facility] [phone]

## 2016-12-06 ENCOUNTER — Ambulatory Visit (INDEPENDENT_AMBULATORY_CARE_PROVIDER_SITE_OTHER): Payer: Managed Care, Other (non HMO)

## 2016-12-06 ENCOUNTER — Other Ambulatory Visit (INDEPENDENT_AMBULATORY_CARE_PROVIDER_SITE_OTHER): Payer: Managed Care, Other (non HMO)

## 2016-12-06 DIAGNOSIS — Z79899 Other long term (current) drug therapy: Secondary | ICD-10-CM

## 2016-12-06 DIAGNOSIS — Z86718 Personal history of other venous thrombosis and embolism: Secondary | ICD-10-CM | POA: Diagnosis not present

## 2016-12-06 DIAGNOSIS — Z86711 Personal history of pulmonary embolism: Secondary | ICD-10-CM

## 2016-12-06 DIAGNOSIS — Z7901 Long term (current) use of anticoagulants: Secondary | ICD-10-CM | POA: Diagnosis not present

## 2016-12-06 DIAGNOSIS — Z5181 Encounter for therapeutic drug level monitoring: Secondary | ICD-10-CM | POA: Diagnosis not present

## 2016-12-06 DIAGNOSIS — I2119 ST elevation (STEMI) myocardial infarction involving other coronary artery of inferior wall: Secondary | ICD-10-CM | POA: Diagnosis not present

## 2016-12-06 DIAGNOSIS — I480 Paroxysmal atrial fibrillation: Secondary | ICD-10-CM

## 2016-12-06 LAB — POCT INR: INR: 3.6

## 2016-12-07 ENCOUNTER — Ambulatory Visit: Payer: Managed Care, Other (non HMO) | Admitting: Cardiology

## 2016-12-07 DIAGNOSIS — I739 Peripheral vascular disease, unspecified: Secondary | ICD-10-CM | POA: Insufficient documentation

## 2016-12-07 LAB — BASIC METABOLIC PANEL
BUN/Creatinine Ratio: 22 (ref 10–24)
BUN: 25 mg/dL (ref 8–27)
CO2: 21 mmol/L (ref 20–29)
Calcium: 8.7 mg/dL (ref 8.6–10.2)
Chloride: 98 mmol/L (ref 96–106)
Creatinine, Ser: 1.13 mg/dL (ref 0.76–1.27)
GFR calc Af Amer: 81 mL/min/{1.73_m2} (ref >59)
GFR calc non Af Amer: 70 mL/min/{1.73_m2} (ref >59)
Glucose: 322 mg/dL — ABNORMAL HIGH (ref 65–99)
Potassium: 5.1 mmol/L (ref 3.5–5.2)
Sodium: 135 mmol/L (ref 134–144)

## 2016-12-07 NOTE — Telephone Encounter (Signed)
No additional notes

## 2016-12-08 ENCOUNTER — Ambulatory Visit: Payer: Self-pay | Admitting: Physician Assistant

## 2016-12-18 ENCOUNTER — Other Ambulatory Visit (INDEPENDENT_AMBULATORY_CARE_PROVIDER_SITE_OTHER): Payer: Self-pay | Admitting: Vascular Surgery

## 2016-12-18 ENCOUNTER — Encounter (INDEPENDENT_AMBULATORY_CARE_PROVIDER_SITE_OTHER): Payer: Managed Care, Other (non HMO)

## 2016-12-18 ENCOUNTER — Ambulatory Visit (INDEPENDENT_AMBULATORY_CARE_PROVIDER_SITE_OTHER): Payer: Managed Care, Other (non HMO) | Admitting: Vascular Surgery

## 2016-12-18 DIAGNOSIS — I739 Peripheral vascular disease, unspecified: Secondary | ICD-10-CM

## 2016-12-19 ENCOUNTER — Encounter (INDEPENDENT_AMBULATORY_CARE_PROVIDER_SITE_OTHER): Payer: Self-pay | Admitting: Vascular Surgery

## 2016-12-19 ENCOUNTER — Encounter (INDEPENDENT_AMBULATORY_CARE_PROVIDER_SITE_OTHER): Payer: Self-pay

## 2016-12-19 ENCOUNTER — Other Ambulatory Visit (INDEPENDENT_AMBULATORY_CARE_PROVIDER_SITE_OTHER): Payer: Self-pay | Admitting: Vascular Surgery

## 2016-12-19 ENCOUNTER — Ambulatory Visit (INDEPENDENT_AMBULATORY_CARE_PROVIDER_SITE_OTHER): Payer: Managed Care, Other (non HMO)

## 2016-12-19 ENCOUNTER — Ambulatory Visit (INDEPENDENT_AMBULATORY_CARE_PROVIDER_SITE_OTHER): Payer: Managed Care, Other (non HMO) | Admitting: Vascular Surgery

## 2016-12-19 VITALS — BP 121/72 | HR 81 | Resp 16 | Wt 248.0 lb

## 2016-12-19 DIAGNOSIS — I10 Essential (primary) hypertension: Secondary | ICD-10-CM | POA: Diagnosis not present

## 2016-12-19 DIAGNOSIS — E1142 Type 2 diabetes mellitus with diabetic polyneuropathy: Secondary | ICD-10-CM | POA: Diagnosis not present

## 2016-12-19 DIAGNOSIS — Z86718 Personal history of other venous thrombosis and embolism: Secondary | ICD-10-CM

## 2016-12-19 DIAGNOSIS — I7025 Atherosclerosis of native arteries of other extremities with ulceration: Secondary | ICD-10-CM | POA: Diagnosis not present

## 2016-12-19 DIAGNOSIS — I739 Peripheral vascular disease, unspecified: Secondary | ICD-10-CM | POA: Diagnosis not present

## 2016-12-19 DIAGNOSIS — I2510 Atherosclerotic heart disease of native coronary artery without angina pectoris: Secondary | ICD-10-CM

## 2016-12-19 DIAGNOSIS — E1165 Type 2 diabetes mellitus with hyperglycemia: Secondary | ICD-10-CM

## 2016-12-19 NOTE — Patient Instructions (Signed)
Angiogram  An angiogram, also called angiography, is a procedure used to look at the blood vessels. In this procedure, dye is injected through a long, thin tube (catheter) into an artery. X-rays are then taken. The X-rays will show if there is a blockage or problem in a blood vessel.  Tell a health care provider about:   Any allergies you have, including allergies to shellfish or contrast dye.   All medicines you are taking, including vitamins, herbs, eye drops, creams, and over-the-counter medicines.   Any problems you or family members have had with anesthetic medicines.   Any blood disorders you have.   Any surgeries you have had.   Any previous kidney problems or failure you have had.   Any medical conditions you have.   Possibility of pregnancy, if this applies.  What are the risks?  Generally, an angiogram is a safe procedure. However, as with any procedure, problems can occur. Possible problems include:   Injury to the blood vessels, including rupture or bleeding.   Infection or bruising at the catheter site.   Allergic reaction to the dye or contrast used.   Kidney damage from the dye or contrast used.   Blood clots that can lead to a stroke or heart attack.    What happens before the procedure?   Do not eat or drink after midnight on the night before the procedure, or as directed by your health care provider.   Ask your health care provider if you may drink enough water to take any needed medicines the morning of the procedure.  What happens during the procedure?   You may be given a medicine to help you relax (sedative) before and during the procedure. This medicine is given through an IV access tube that is inserted into one of your veins.   The area where the catheter will be inserted will be washed and shaved. This is usually done in the groin but may be done in the fold of your arm (near your elbow) or in the wrist.   A medicine will be given to numb the area where the catheter will  be inserted (local anesthetic).   The catheter will be inserted with a guide wire into an artery. The catheter is guided by using a type of X-ray (fluoroscopy) to the blood vessel being examined.   Dye is then injected into the catheter, and X-rays are taken. The dye helps to show where any narrowing or blockages are located.  What happens after the procedure?   If the procedure is done through the leg, you will be kept in bed lying flat for several hours. You will be instructed to not bend or cross your legs.   The insertion site will be checked frequently.   The pulse in your feet or wrist will be checked frequently.   Additional blood tests, X-rays, and electrocardiography may be done.   You may need to stay in the hospital overnight for observation.  This information is not intended to replace advice given to you by your health care provider. Make sure you discuss any questions you have with your health care provider.  Document Released: 11/09/2004 Document Revised: 07/14/2015 Document Reviewed: 07/03/2012  Elsevier Interactive Patient Education  2017 [employer]

## 2016-12-19 NOTE — Assessment & Plan Note (Signed)
On anticoagulation 

## 2016-12-19 NOTE — Assessment & Plan Note (Signed)
blood pressure control important in reducing the progression of atherosclerotic disease. On appropriate oral medications.  

## 2016-12-19 NOTE — Assessment & Plan Note (Signed)
Multiple previous coronary interventions

## 2016-12-19 NOTE — Assessment & Plan Note (Signed)
blood glucose control important in reducing the progression of atherosclerotic disease. Also, involved in wound healing. On appropriate medications.  

## 2016-12-19 NOTE — Progress Notes (Signed)
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 Ref Range   WBC 15.4 (H) 4.0 - 10.5 K/uL   RBC 4.31 4.22 - 5.81 MIL/uL   Hemoglobin 12.3 (L) 13.0 - 17.0 g/dL   HCT 36.5 (L) 39.0 - 52.0 %   MCV 84.7 78.0 - 100.0 fL   MCH 28.5 26.0 - 34.0 pg   MCHC 33.7 30.0 - 36.0 g/dL   RDW 13.6 11.5 - 15.5 %   Platelets 182 150 - 400 K/uL   Neutrophils Relative % 73 %   Lymphocytes Relative 15 %   Monocytes Relative 12 %   Eosinophils Relative 0 %   Basophils Relative 0 %   Neutro Abs 11.3 (H) 1.7 - 7.7 K/uL   Lymphs Abs 2.3 0.7 - 4.0 K/uL   Monocytes Absolute 1.8 (H) 0.1 - 1.0 K/uL   Eosinophils Absolute 0.0 0.0 - 0.7 K/uL   Basophils Absolute 0.0 0.0 - 0.1 K/uL   Smear Review MORPHOLOGY UNREMARKABLE   Magnesium     Status: None   Collection Time: 11/25/16  4:00 AM  Result Value Ref Range   Magnesium 2.0 1.7 - 2.4 mg/dL  Protime-INR     Status: Abnormal   Collection  Time: 11/25/16  4:00 AM  Result Value Ref Range  Prothrombin Time 21.2 (H) 11.4 - 15.2 seconds   INR 1.85   Hemoglobin A1c     Status: Abnormal   Collection Time: 11/25/16  4:00 AM  Result Value Ref Range   Hgb A1c MFr Bld 9.6 (H) 4.8 - 5.6 %    Comment: (NOTE) Pre diabetes:          5.7%-6.4% Diabetes:              >6.4% Glycemic control for   <7.0% adults with diabetes    Mean Plasma Glucose 228.82 mg/dL  Glucose, capillary     Status: Abnormal   Collection Time: 11/25/16  7:23 AM  Result Value Ref Range   Glucose-Capillary 240 (H) 65 - 99 mg/dL   Comment 1 Notify RN    Comment 2 Document in Chart   Glucose, capillary     Status: Abnormal   Collection Time: 11/25/16 11:55 AM  Result Value Ref Range   Glucose-Capillary 243 (H) 65 - 99 mg/dL   Comment 1 Notify RN    Comment 2 Document in Chart   Glucose, capillary     Status: Abnormal   Collection Time: 11/25/16  4:03 PM  Result Value Ref Range   Glucose-Capillary 236 (H) 65 - 99 mg/dL   Comment 1 Notify RN    Comment 2 Document in Chart   Glucose, capillary     Status: Abnormal   Collection Time: 11/25/16  8:41 PM  Result Value Ref Range   Glucose-Capillary 221 (H) 65 - 99 mg/dL  Basic metabolic panel     Status: Abnormal   Collection Time: 11/26/16  3:52 AM  Result Value Ref Range   Sodium 133 (L) 135 - 145 mmol/L   Potassium 4.1 3.5 - 5.1 mmol/L   Chloride 98 (L) 101 - 111 mmol/L   CO2 26 22 - 32 mmol/L   Glucose, Bld 64 (L) 65 - 99 mg/dL   BUN 46 (H) 6 - 20 mg/dL   Creatinine, Ser 1.31 (H) 0.61 - 1.24 mg/dL   Calcium 8.4 (L) 8.9 - 10.3 mg/dL   GFR calc non Af Amer 57 (L) >60 mL/min   GFR calc Af Amer >60 >60 mL/min    Comment: (NOTE) The eGFR has been calculated using the CKD EPI equation. This calculation has not been validated in all clinical situations. eGFR's persistently <60 mL/min signify possible Chronic Kidney Disease.    Anion gap 9 5 - 15  Protime-INR     Status: Abnormal   Collection Time:  11/26/16  3:52 AM  Result Value Ref Range   Prothrombin Time 24.5 (H) 11.4 - 15.2 seconds   INR 2.22   Magnesium     Status: None   Collection Time: 11/26/16  3:52 AM  Result Value Ref Range   Magnesium 2.0 1.7 - 2.4 mg/dL  Glucose, capillary     Status: None   Collection Time: 11/26/16  7:19 AM  Result Value Ref Range   Glucose-Capillary 81 65 - 99 mg/dL   Comment 1 Notify RN    Comment 2 Document in Chart   Glucose, capillary     Status: Abnormal   Collection Time: 11/26/16 11:44 AM  Result Value Ref Range   Glucose-Capillary 126 (H) 65 - 99 mg/dL   Comment 1 Notify RN    Comment 2 Document in Chart   Glucose, capillary     Status: Abnormal   Collection Time: 11/26/16  4:57 PM  Result Value Ref Range  Glucose-Capillary 144 (H) 65 - 99 mg/dL  POCT INR     Status: None   Collection Time: 11/29/16 11:30 AM  Result Value Ref Range   INR 4.4   POCT INR     Status: None   Collection Time: 12/06/16 12:48 PM  Result Value Ref Range   INR 3.6   Basic metabolic panel     Status: Abnormal   Collection Time: 12/06/16  1:08 PM  Result Value Ref Range   Glucose 322 (H) 65 - 99 mg/dL   BUN 25 8 - 27 mg/dL   Creatinine, Ser 1.13 0.76 - 1.27 mg/dL   GFR calc non Af Amer 70 >59 mL/min/1.73   GFR calc Af Amer 81 >59 mL/min/1.73   BUN/Creatinine Ratio 22 10 - 24   Sodium 135 134 - 144 mmol/L   Potassium 5.1 3.5 - 5.2 mmol/L   Chloride 98 96 - 106 mmol/L   CO2 21 20 - 29 mmol/L   Calcium 8.7 8.6 - 10.2 mg/dL    Assessment/Plan:  CAD (coronary artery disease) Multiple previous coronary interventions  HTN (hypertension) blood pressure control important in reducing the progression of atherosclerotic disease. On appropriate oral medications.   Poorly controlled type 2 diabetes mellitus with peripheral neuropathy (HCC) blood glucose control important in reducing the progression of atherosclerotic disease. Also, involved in wound healing. On appropriate medications.   History of  recurrent deep vein thrombosis (DVT) On anticoagulation  Atherosclerosis of native arteries of the extremities with ulceration ([place]) This is a very critical and obviously limb threatening situation I have discussed with the patient all of his toes may be lost and I think the fourth and fifth toes are more than likely going to need to be amputated at the least.  He still has multiple wounds up around the foot and ankle.  Although his perfusion is improved, any improvement we can add by additional tibial interventions would certainly be of benefit.  I have discussed that a repeat angiogram would be the only way to determine whether or not this would be possible.  I think the most likely outcome is actually a transmetatarsal amputation although he may be able to salvage a few of the toes.  This is a very difficult situation and his wound healing will be impaired not only by his perfusion but also by his diabetes with suboptimal control.  I will be happy to assist in any way I can and they would like me to proceed with an angiogram.  This will be performed next week.  I will have him off Coumadin 2-3 days before the procedure.  I have discussed the risks and benefits of the procedure and they are agreeable to proceed.     Leotis Pain 12/19/2016, 11:34 AM   This note was created with [facility] medical dictation system.  Any errors from dictation are unintentional.

## 2016-12-19 NOTE — Assessment & Plan Note (Signed)
This is a very critical and obviously limb threatening situation I have discussed with the patient all of his toes may be lost and I think the fourth and fifth toes are more than likely going to need to be amputated at the least.  He still has multiple wounds up around the foot and ankle.  Although his perfusion is improved, any improvement we can add by additional tibial interventions would certainly be of benefit.  I have discussed that a repeat angiogram would be the only way to determine whether or not this would be possible.  I think the most likely outcome is actually a transmetatarsal amputation although he may be able to salvage a few of the toes.  This is a very difficult situation and his wound healing will be impaired not only by his perfusion but also by his diabetes with suboptimal control.  I will be happy to assist in any way I can and they would like me to proceed with an angiogram.  This will be performed next week.  I will have him off Coumadin 2-3 days before the procedure.  I have discussed the risks and benefits of the procedure and they are agreeable to proceed.

## 2016-12-21 ENCOUNTER — Telehealth (INDEPENDENT_AMBULATORY_CARE_PROVIDER_SITE_OTHER): Payer: Self-pay

## 2016-12-21 NOTE — Telephone Encounter (Signed)
Attempted to contact the patient regarding his procedure on 12/25/16. Due to his needing anesthesia during his procedure he had to be moved back, so his check in time at the hospital will be 11:20 am instead of 9:30 am. I have attempted to contact the patient to give him this update. I left messages on the preferred number, called the home number and left a message, and called his wife's cell phone, unfortunately I was unable to leave a message on her phone due to her voice mail being full.

## 2016-12-22 ENCOUNTER — Encounter
Admission: RE | Admit: 2016-12-22 | Discharge: 2016-12-22 | Disposition: A | Discharge status: Home / Self Care | Payer: Managed Care, Other (non HMO) | Source: Ambulatory Visit | Attending: Vascular Surgery | Admitting: Vascular Surgery

## 2016-12-22 DIAGNOSIS — I4891 Unspecified atrial fibrillation: Secondary | ICD-10-CM | POA: Diagnosis not present

## 2016-12-22 DIAGNOSIS — L97529 Non-pressure chronic ulcer of other part of left foot with unspecified severity: Secondary | ICD-10-CM | POA: Diagnosis not present

## 2016-12-22 DIAGNOSIS — E1142 Type 2 diabetes mellitus with diabetic polyneuropathy: Secondary | ICD-10-CM | POA: Diagnosis not present

## 2016-12-22 DIAGNOSIS — I5032 Chronic diastolic (congestive) heart failure: Secondary | ICD-10-CM | POA: Diagnosis not present

## 2016-12-22 DIAGNOSIS — G4730 Sleep apnea, unspecified: Secondary | ICD-10-CM | POA: Diagnosis not present

## 2016-12-22 DIAGNOSIS — I2510 Atherosclerotic heart disease of native coronary artery without angina pectoris: Secondary | ICD-10-CM | POA: Diagnosis not present

## 2016-12-22 DIAGNOSIS — Z9889 Other specified postprocedural states: Secondary | ICD-10-CM | POA: Diagnosis not present

## 2016-12-22 DIAGNOSIS — Z7902 Long term (current) use of antithrombotics/antiplatelets: Secondary | ICD-10-CM | POA: Diagnosis not present

## 2016-12-22 DIAGNOSIS — Z809 Family history of malignant neoplasm, unspecified: Secondary | ICD-10-CM | POA: Diagnosis not present

## 2016-12-22 DIAGNOSIS — Z794 Long term (current) use of insulin: Secondary | ICD-10-CM | POA: Diagnosis not present

## 2016-12-22 DIAGNOSIS — E785 Hyperlipidemia, unspecified: Secondary | ICD-10-CM | POA: Diagnosis not present

## 2016-12-22 DIAGNOSIS — E11621 Type 2 diabetes mellitus with foot ulcer: Secondary | ICD-10-CM | POA: Diagnosis not present

## 2016-12-22 DIAGNOSIS — I7025 Atherosclerosis of native arteries of other extremities with ulceration: Secondary | ICD-10-CM | POA: Diagnosis not present

## 2016-12-22 DIAGNOSIS — Z888 Allergy status to other drugs, medicaments and biological substances status: Secondary | ICD-10-CM | POA: Diagnosis not present

## 2016-12-22 DIAGNOSIS — Z951 Presence of aortocoronary bypass graft: Secondary | ICD-10-CM | POA: Diagnosis not present

## 2016-12-22 DIAGNOSIS — Z79899 Other long term (current) drug therapy: Secondary | ICD-10-CM | POA: Diagnosis not present

## 2016-12-22 DIAGNOSIS — I110 Hypertensive heart disease with heart failure: Secondary | ICD-10-CM | POA: Diagnosis not present

## 2016-12-22 DIAGNOSIS — K219 Gastro-esophageal reflux disease without esophagitis: Secondary | ICD-10-CM | POA: Diagnosis not present

## 2016-12-22 DIAGNOSIS — Z955 Presence of coronary angioplasty implant and graft: Secondary | ICD-10-CM | POA: Diagnosis not present

## 2016-12-22 DIAGNOSIS — Z7982 Long term (current) use of aspirin: Secondary | ICD-10-CM | POA: Diagnosis not present

## 2016-12-22 DIAGNOSIS — I96 Gangrene, not elsewhere classified: Secondary | ICD-10-CM | POA: Diagnosis not present

## 2016-12-22 DIAGNOSIS — Z8249 Family history of ischemic heart disease and other diseases of the circulatory system: Secondary | ICD-10-CM | POA: Diagnosis not present

## 2016-12-22 DIAGNOSIS — Z86718 Personal history of other venous thrombosis and embolism: Secondary | ICD-10-CM | POA: Diagnosis not present

## 2016-12-22 LAB — PROTIME-INR
INR: 2.89
Prothrombin Time: 30 seconds — ABNORMAL HIGH (ref 11.4–15.2)

## 2016-12-22 LAB — BUN: BUN: 23 mg/dL — ABNORMAL HIGH (ref 6–20)

## 2016-12-22 LAB — CREATININE, SERUM
Creatinine, Ser: 1.02 mg/dL (ref 0.61–1.24)
GFR calc Af Amer: >60 mL/min (ref >60)
GFR calc non Af Amer: >60 mL/min (ref >60)

## 2016-12-25 ENCOUNTER — Ambulatory Visit: Payer: Managed Care, Other (non HMO) | Admitting: Anesthesiology

## 2016-12-25 ENCOUNTER — Encounter
Admission: RE | Disposition: A | Discharge status: Home / Self Care | Payer: Self-pay | Source: Ambulatory Visit | Attending: Vascular Surgery

## 2016-12-25 ENCOUNTER — Encounter: Payer: Self-pay | Admitting: *Deleted

## 2016-12-25 ENCOUNTER — Ambulatory Visit
Admission: RE | Admit: 2016-12-25 | Discharge: 2016-12-25 | Disposition: A | Discharge status: Home / Self Care | Payer: Managed Care, Other (non HMO) | Source: Ambulatory Visit | Attending: Vascular Surgery | Admitting: Vascular Surgery

## 2016-12-25 DIAGNOSIS — G4730 Sleep apnea, unspecified: Secondary | ICD-10-CM | POA: Insufficient documentation

## 2016-12-25 DIAGNOSIS — Z794 Long term (current) use of insulin: Secondary | ICD-10-CM | POA: Insufficient documentation

## 2016-12-25 DIAGNOSIS — I7025 Atherosclerosis of native arteries of other extremities with ulceration: Secondary | ICD-10-CM | POA: Insufficient documentation

## 2016-12-25 DIAGNOSIS — Z7902 Long term (current) use of antithrombotics/antiplatelets: Secondary | ICD-10-CM | POA: Insufficient documentation

## 2016-12-25 DIAGNOSIS — Z8249 Family history of ischemic heart disease and other diseases of the circulatory system: Secondary | ICD-10-CM | POA: Insufficient documentation

## 2016-12-25 DIAGNOSIS — Z888 Allergy status to other drugs, medicaments and biological substances status: Secondary | ICD-10-CM | POA: Insufficient documentation

## 2016-12-25 DIAGNOSIS — I96 Gangrene, not elsewhere classified: Secondary | ICD-10-CM | POA: Insufficient documentation

## 2016-12-25 DIAGNOSIS — Z7982 Long term (current) use of aspirin: Secondary | ICD-10-CM | POA: Insufficient documentation

## 2016-12-25 DIAGNOSIS — E11621 Type 2 diabetes mellitus with foot ulcer: Secondary | ICD-10-CM | POA: Insufficient documentation

## 2016-12-25 DIAGNOSIS — Z955 Presence of coronary angioplasty implant and graft: Secondary | ICD-10-CM | POA: Insufficient documentation

## 2016-12-25 DIAGNOSIS — E1142 Type 2 diabetes mellitus with diabetic polyneuropathy: Secondary | ICD-10-CM | POA: Insufficient documentation

## 2016-12-25 DIAGNOSIS — I2510 Atherosclerotic heart disease of native coronary artery without angina pectoris: Secondary | ICD-10-CM | POA: Insufficient documentation

## 2016-12-25 DIAGNOSIS — E785 Hyperlipidemia, unspecified: Secondary | ICD-10-CM | POA: Insufficient documentation

## 2016-12-25 DIAGNOSIS — Z9889 Other specified postprocedural states: Secondary | ICD-10-CM | POA: Insufficient documentation

## 2016-12-25 DIAGNOSIS — I70219 Atherosclerosis of native arteries of extremities with intermittent claudication, unspecified extremity: Secondary | ICD-10-CM

## 2016-12-25 DIAGNOSIS — K219 Gastro-esophageal reflux disease without esophagitis: Secondary | ICD-10-CM | POA: Insufficient documentation

## 2016-12-25 DIAGNOSIS — Z86718 Personal history of other venous thrombosis and embolism: Secondary | ICD-10-CM | POA: Insufficient documentation

## 2016-12-25 DIAGNOSIS — Z809 Family history of malignant neoplasm, unspecified: Secondary | ICD-10-CM | POA: Insufficient documentation

## 2016-12-25 DIAGNOSIS — Z951 Presence of aortocoronary bypass graft: Secondary | ICD-10-CM | POA: Insufficient documentation

## 2016-12-25 DIAGNOSIS — I70262 Atherosclerosis of native arteries of extremities with gangrene, left leg: Secondary | ICD-10-CM | POA: Diagnosis not present

## 2016-12-25 DIAGNOSIS — I110 Hypertensive heart disease with heart failure: Secondary | ICD-10-CM | POA: Insufficient documentation

## 2016-12-25 DIAGNOSIS — I4891 Unspecified atrial fibrillation: Secondary | ICD-10-CM | POA: Insufficient documentation

## 2016-12-25 DIAGNOSIS — I5032 Chronic diastolic (congestive) heart failure: Secondary | ICD-10-CM | POA: Insufficient documentation

## 2016-12-25 DIAGNOSIS — Z79899 Other long term (current) drug therapy: Secondary | ICD-10-CM | POA: Insufficient documentation

## 2016-12-25 DIAGNOSIS — L97529 Non-pressure chronic ulcer of other part of left foot with unspecified severity: Secondary | ICD-10-CM | POA: Insufficient documentation

## 2016-12-25 HISTORY — DX: Personal history of other diseases of the digestive system: Z87.19

## 2016-12-25 HISTORY — PX: LOWER EXTREMITY ANGIOGRAPHY: CATH118251

## 2016-12-25 LAB — GLUCOSE, CAPILLARY: Glucose-Capillary: 229 mg/dL — ABNORMAL HIGH (ref 65–99)

## 2016-12-25 SURGERY — LOWER EXTREMITY ANGIOGRAPHY
Anesthesia: General | Laterality: Left

## 2016-12-25 MED ORDER — FAMOTIDINE 20 MG PO TABS: 40.0000 mg | ORAL_TABLET | ORAL | Status: DC | PRN

## 2016-12-25 MED ORDER — FENTANYL CITRATE (PF) 100 MCG/2ML IJ SOLN
INTRAMUSCULAR | Status: AC
  Filled 2016-12-25: qty 2

## 2016-12-25 MED ORDER — IOPAMIDOL (ISOVUE-300) INJECTION 61%
INTRAVENOUS | Status: DC | PRN
  Administered 2016-12-25: 66 mL via INTRAVENOUS

## 2016-12-25 MED ORDER — SODIUM CHLORIDE 0.9 % IV SOLN
INTRAVENOUS | Status: DC
  Administered 2016-12-25 (×2): via INTRAVENOUS

## 2016-12-25 MED ORDER — KETAMINE HCL 50 MG/ML IJ SOLN
INTRAMUSCULAR | Status: AC
  Filled 2016-12-25: qty 10

## 2016-12-25 MED ORDER — MIDAZOLAM HCL 2 MG/2ML IJ SOLN
INTRAMUSCULAR | Status: DC | PRN
  Administered 2016-12-25: 1 mg via INTRAVENOUS

## 2016-12-25 MED ORDER — ONDANSETRON HCL 4 MG/2ML IJ SOLN: 4.0000 mg | Freq: Once | INTRAMUSCULAR | Status: DC | PRN

## 2016-12-25 MED ORDER — PROPOFOL 500 MG/50ML IV EMUL
INTRAVENOUS | Status: DC | PRN
  Administered 2016-12-25: 100 ug/kg/min via INTRAVENOUS

## 2016-12-25 MED ORDER — FENTANYL CITRATE (PF) 100 MCG/2ML IJ SOLN
25.0000 ug | INTRAMUSCULAR | Status: DC | PRN
  Administered 2016-12-25 (×3): 25 ug via INTRAVENOUS

## 2016-12-25 MED ORDER — MIDAZOLAM HCL 2 MG/2ML IJ SOLN
INTRAMUSCULAR | Status: AC
  Filled 2016-12-25: qty 2

## 2016-12-25 MED ORDER — LIDOCAINE-EPINEPHRINE (PF) 1 %-1:200000 IJ SOLN
INTRAMUSCULAR | Status: AC
  Filled 2016-12-25: qty 30

## 2016-12-25 MED ORDER — PROPOFOL 10 MG/ML IV BOLUS
INTRAVENOUS | Status: AC
  Filled 2016-12-25: qty 20

## 2016-12-25 MED ORDER — PROPOFOL 500 MG/50ML IV EMUL
INTRAVENOUS | Status: AC
  Filled 2016-12-25: qty 50

## 2016-12-25 MED ORDER — KETAMINE HCL 50 MG/ML IJ SOLN
INTRAMUSCULAR | Status: DC | PRN
  Administered 2016-12-25: 10 mg via INTRAMUSCULAR

## 2016-12-25 MED ORDER — INSULIN ASPART 100 UNIT/ML ~~LOC~~ SOLN
5.0000 [IU] | Freq: Once | SUBCUTANEOUS | Status: AC
  Administered 2016-12-25: 5 [IU] via SUBCUTANEOUS

## 2016-12-25 MED ORDER — HEPARIN (PORCINE) IN NACL 2-0.9 UNIT/ML-% IJ SOLN
INTRAMUSCULAR | Status: AC
  Filled 2016-12-25: qty 1000

## 2016-12-25 MED ORDER — METHYLPREDNISOLONE SODIUM SUCC 125 MG IJ SOLR: 125.0000 mg | INTRAMUSCULAR | Status: DC | PRN

## 2016-12-25 MED ORDER — CEFAZOLIN SODIUM-DEXTROSE 2-4 GM/100ML-% IV SOLN
2.0000 g | Freq: Once | INTRAVENOUS | Status: AC
  Administered 2016-12-25: 2 g via INTRAVENOUS

## 2016-12-25 MED ORDER — CEFAZOLIN SODIUM-DEXTROSE 2-4 GM/100ML-% IV SOLN
INTRAVENOUS | Status: AC
  Filled 2016-12-25: qty 100

## 2016-12-25 MED ORDER — HEPARIN SODIUM (PORCINE) 1000 UNIT/ML IJ SOLN
INTRAMUSCULAR | Status: DC | PRN
  Administered 2016-12-25: 3000 [IU] via INTRAVENOUS

## 2016-12-25 MED ORDER — FENTANYL CITRATE (PF) 100 MCG/2ML IJ SOLN
INTRAMUSCULAR | Status: DC | PRN
  Administered 2016-12-25 (×3): 50 ug via INTRAVENOUS

## 2016-12-25 SURGICAL SUPPLY — 25 items
BALLN ULTRVRSE 018 2.5X150X150 (BALLOONS) ×3
BALLN ULTRVRSE 2X150X150 (BALLOONS) ×3
BALLN ULTRVRSE 3X220X150 (BALLOONS) ×3
BALLOON ULTRVRSE 2X150X150 (BALLOONS) ×1 IMPLANT
BALLOON ULTRVRSE 3X220X150 (BALLOONS) ×1 IMPLANT
BALLOON ULTRVS 018 2.5X150X150 (BALLOONS) ×1 IMPLANT
CATH CXI SUPP ANG 2.6FR 150CM (MICROCATHETER) ×3 IMPLANT
CATH CXI SUPP ANG 4FR 135 (MICROCATHETER) ×1 IMPLANT
CATH CXI SUPP ANG 4FR 135CM (MICROCATHETER) ×3
CATH IMAGER II S 5FR 65CM (MISCELLANEOUS) ×3 IMPLANT
COVER PROBE U/S 5X48 (MISCELLANEOUS) ×3 IMPLANT
DEVICE PRESTO INFLATION (MISCELLANEOUS) ×3 IMPLANT
DEVICE SAFEGUARD 24CM (GAUZE/BANDAGES/DRESSINGS) ×3 IMPLANT
DEVICE STARCLOSE SE CLOSURE (Vascular Products) ×3 IMPLANT
DEVICE TORQUE (MISCELLANEOUS) ×3 IMPLANT
GLIDEWIRE ADV .014X300CM (WIRE) ×3 IMPLANT
GUIDEWIRE ANGLED .035X260CM (WIRE) ×3 IMPLANT
PACK ANGIOGRAPHY (CUSTOM PROCEDURE TRAY) ×3 IMPLANT
SHEATH ANL1 5FRX70 (SHEATH) ×3 IMPLANT
SHEATH BRITE TIP 5FRX11 (SHEATH) ×3 IMPLANT
TOWEL OR 17X26 4PK STRL BLUE (TOWEL DISPOSABLE) ×3 IMPLANT
TUBING CONTRAST HIGH PRESS 72 (TUBING) ×3 IMPLANT
WIRE AMPLATZ SSTIFF .035X260CM (WIRE) ×3 IMPLANT
WIRE G V18X300CM (WIRE) ×3 IMPLANT
WIRE J 3MM .035X145CM (WIRE) ×3 IMPLANT

## 2016-12-25 NOTE — Anesthesia Postprocedure Evaluation (Signed)
Anesthesia Post Note  Patient: Bobby Lindsey  Procedure(s) Performed: LOWER EXTREMITY ANGIOGRAPHY (Left )  Patient location during evaluation: PACU Anesthesia Type: General Level of consciousness: awake and alert Pain management: pain level controlled Vital Signs Assessment: post-procedure vital signs reviewed and stable Respiratory status: spontaneous breathing, nonlabored ventilation, respiratory function stable and patient connected to nasal cannula oxygen Cardiovascular status: blood pressure returned to baseline and stable Postop Assessment: no apparent nausea or vomiting Anesthetic complications: no     Last Vitals:  Vitals:   12/25/16 1655 12/25/16 1700  BP: (!) 134/59 (!) 129/59  Pulse: 85   Resp: 14   Temp:    SpO2: 95%     Last Pain:  Vitals:   12/25/16 1543  TempSrc:   PainSc: 7                  Bobby Lindsey

## 2016-12-25 NOTE — Anesthesia Post-op Follow-up Note (Signed)
Anesthesia QCDR form completed.        

## 2016-12-25 NOTE — Discharge Instructions (Signed)
Angiogram, Care After °This sheet gives you information about how to care for yourself after your procedure. Your health care provider may also give you more specific instructions. If you have problems or questions, contact your health care provider. °What can I expect after the procedure? °After the procedure, it is common to have bruising and tenderness at the catheter insertion area. °Follow these instructions at home: °Insertion site care  °· Follow instructions from your health care provider about how to take care of your insertion site. Make sure you: °¨ Wash your hands with soap and water before you change your bandage (dressing). If soap and water are not available, use hand sanitizer. °¨ Change your dressing as told by your health care provider. °¨ Leave stitches (sutures), skin glue, or adhesive strips in place. These skin closures may need to stay in place for 2 weeks or longer. If adhesive strip edges start to loosen and curl up, you may trim the loose edges. Do not remove adhesive strips completely unless your health care provider tells you to do that. °· Do not take baths, swim, or use a hot tub until your health care provider approves. °· You may shower 24-48 hours after the procedure or as told by your health care provider. °¨ Gently wash the site with plain soap and water. °¨ Pat the area dry with a clean towel. °¨ Do not rub the site. This may cause bleeding. °· Do not apply powder or lotion to the site. Keep the site clean and dry. °· Check your insertion site every day for signs of infection. Check for: °¨ Redness, swelling, or pain. °¨ Fluid or blood. °¨ Warmth. °¨ Pus or a bad smell. °Activity  °· Rest as told by your health care provider, usually for 1-2 days. °· Do not lift anything that is heavier than 10 lbs. (4.5 kg) or as told by your health care provider. °· Do not drive for 24 hours if you were given a medicine to help you relax (sedative). °· Do not drive or use heavy machinery while  taking prescription pain medicine. °General instructions  °· Return to your normal activities as told by your health care provider, usually in about a week. Ask your health care provider what activities are safe for you. °· If the catheter site starts bleeding, lie flat and put pressure on the site. If the bleeding does not stop, get help right away. This is a medical emergency. °· Drink enough fluid to keep your urine clear or pale yellow. This helps flush the contrast dye from your body. °· Take over-the-counter and prescription medicines only as told by your health care provider. °· Keep all follow-up visits as told by your health care provider. This is important. °Contact a health care provider if: °· You have a fever or chills. °· You have redness, swelling, or pain around your insertion site. °· You have fluid or blood coming from your insertion site. °· The insertion site feels warm to the touch. °· You have pus or a bad smell coming from your insertion site. °· You have bruising around the insertion site. °· You notice blood collecting in the tissue around the catheter site (hematoma). The hematoma may be painful to the touch. °Get help right away if: °· You have severe pain at the catheter insertion area. °· The catheter insertion area swells very fast. °· The catheter insertion area is bleeding, and the bleeding does not stop when you hold steady pressure on   the area. °· The area near or just beyond the catheter insertion site becomes pale, cool, tingly, or numb. °These symptoms may represent a serious problem that is an emergency. Do not wait to see if the symptoms will go away. Get medical help right away. Call your local emergency services (911 in the [place] Do not drive yourself to the hospital. °Summary °· After the procedure, it is common to have bruising and tenderness at the catheter insertion area. °· After the procedure, it is important to rest and drink plenty of fluids. °· Do not take baths,  swim, or use a hot tub until your health care provider says it is okay to do so. You may shower 24-48 hours after the procedure or as told by your health care provider. °· If the catheter site starts bleeding, lie flat and put pressure on the site. If the bleeding does not stop, get help right away. This is a medical emergency. °This information is not intended to replace advice given to you by your health care provider. Make sure you discuss any questions you have with your health care provider. °Document Released: 08/18/2004 Document Revised: 01/05/2016 Document Reviewed: 01/05/2016 °Elsevier Interactive Patient Education © 2017 [employer] ° °

## 2016-12-25 NOTE — Transfer of Care (Signed)
Immediate Anesthesia Transfer of Care Note  Patient: Bobby Lindsey  Procedure(s) Performed: LOWER EXTREMITY ANGIOGRAPHY (Left )  Patient Location: PACU  Anesthesia Type:General  Level of Consciousness: awake  Airway & Oxygen Therapy: Patient Spontanous Breathing and Patient connected to nasal cannula oxygen  Post-op Assessment: Report given to RN and Post -op Vital signs reviewed and stable  Post vital signs: Reviewed and stable  Last Vitals:  Vitals:   12/25/16 0949  Temp: 36.9 C    Last Pain:  Vitals:   12/25/16 0949  TempSrc: Oral  PainSc: 4          Complications: No apparent anesthesia complications

## 2016-12-25 NOTE — Op Note (Signed)
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 Ultrasound guidance for vascular access right femoral artery 2. Catheter placement into left anterior tibial artery and left peroneal artery from right femoral approach 3. Aortogram and selective left lower extremity angiogram 4. Percutaneous transluminal angioplasty of left peroneal artery with 3 mm diameter by 22 cm length angioplasty balloon 5. Percutaneous transluminal angioplasty of the left mid to distal anterior tibial artery with 2 mm diameter by 15 cm length angioplasty balloon in the proximal left anterior tibial artery with 2.5 mm diameter by 15 cm length angioplasty balloon  6.  StarClose closure device right femoral artery  EBL: 10 cc  Contrast: 65 cc  Fluoro Time: 15.1 minutes  Anesthesia: MAC  Indications: Patient is a 61 y.o.male with nonhealing ulcerations on the left foot, ankle, and lower leg as well as some gangrenous changes of the toes. The patient has gone what sounds like a partial revascularization at another institution, but continues to have limb threatening ulcerations. The patient is brought in for angiography for further evaluation and potential treatment. Risks and benefits are discussed and informed consent is obtained  Procedure: The patient was identified and appropriate procedural time out was performed. The patient was then placed supine on the table and prepped and draped in the usual sterile fashion.Moderate conscious sedation was administered during a face to face encounter with the patient throughout the procedure with my supervision of the RN administering medicines  and monitoring the patient's vital signs, pulse oximetry, telemetry and mental status throughout from the start of the procedure until the patient was taken to the recovery room. Ultrasound was used to evaluate the right common femoral artery. It was patent . A digital ultrasound image was acquired. A Seldinger needle was used to access the right common femoral artery under direct ultrasound guidance and a permanent image was performed. A 0.035 J wire was advanced without resistance and a 5Fr sheath was placed. Pigtail catheter was placed into the aorta and an AP aortogram was performed. This demonstrated normal renal arteries and normal aorta and iliac segments without significant stenosis. I then crossed the aortic bifurcation and advanced to the left femoral head. Selective left lower extremity angiogram was then performed. This demonstrated normal common femoral artery, profunda femoris artery, and superficial femoral artery.  The popliteal artery was normal as well.  There was a normal tibial trifurcation with very mild disease in the posterior tibial artery which was continuous into the foot.  The peroneal artery had a nearly occlusive stenosis about 6-8 cm beyond its origin and then was continuous with another area of about 70% stenosis in the mid peroneal artery.  The anterior tibial artery was occluded about 6-8 cm beyond its origin and reconstituted at the ankle. The patient was systemically heparinized and a 5 French 70 cm sheath was then placed over the 0.035 wire. I then used a Kumpe catheter and the glide wire to get into the peroneal artery and then exchanged for a CXI catheter and a 0.018V 18 wire and cross the 2 areas stenosis and confirm intraluminal flow in the distal peroneal artery.  I then replaced the wire and proceeded with treatment.  A 3 mm diameter by 22 cm length angioplasty balloon was inflated from the mid to distal peroneal artery up through  the tibioperoneal trunk.  This was  inflated to 14 atm for 1 minute.  Completion angiogram showed only about a 20-30% residual stenosis which was not flow limiting.  I then turned my attention to the anterior tibial artery.  I was able to get the CXI catheter and the Glidewire into the anterior tibial artery without much difficulty and actually was able to get the V 18 wire all the way into the foot without difficulty.  The CXI catheter would not cross the occlusion even when I exchanged for a 0.018 CXI wire.  I elected to perform angioplasty and used a 2.5 mm diameter by 15 cm length angioplasty balloon for the anterior tibial artery proximally.  This was inflated to 16 atm for 1 minute.  With this, I could get a catheter down into the mid to distal anterior tibial artery but still never into the foot.  I then exchanged for a 0.014 wire and try to get a 0.014, 2 mm diameter by 15 cm length balloon distally.  This was tracked down to the mid to distal anterior tibial artery but not the foot.  I did choose to treat this in hopes of improving some collaterals in the proximal anterior tibial artery.  2 inflations with a 2 mm diameter by 15 cm length balloon were taken up to 18-20 atm for 1 minute.  Completion angiogram showed brisk flow through the posterior tibial and peroneal arteries distally with continued occlusion of the distal anterior tibial artery and only mild improvement of the collateralization proximally.  At this point, I did not think there was much more we could do to help his perfusion in his two-vessel runoff should provide adequate blood flow for wound healing. I elected to terminate the procedure. The sheath was removed and StarClose closure device was deployed in the right femoral artery with excellent hemostatic result. The patient was taken to the recovery room in stable condition having tolerated the procedure well.  Findings:  Aortogram: normal renal arteries, normal aorta and iliac arteries Left  Lower Extremity: This demonstrated normal common femoral artery, profunda femoris artery, and superficial femoral artery.  The popliteal artery was normal as well.  There was a normal tibial trifurcation with very mild disease in the posterior tibial artery which was continuous into the foot.  The peroneal artery had a nearly occlusive stenosis about 6-8 cm beyond its origin and then was continuous with another area of about 70% stenosis in the mid peroneal artery.  The anterior tibial artery was occluded about 6-8 cm beyond its origin and reconstituted at the ankle   Disposition: Patient was taken to the recovery room in stable condition having tolerated the procedure well.  Complications: None  Leotis Pain 12/25/2016 2:58 PM   This note was created with Dragon Medical transcription system. Any errors in dictation are purely unintentional.

## 2016-12-25 NOTE — Anesthesia Preprocedure Evaluation (Signed)
Anesthesia Evaluation  Patient identified by MRN, date of birth, ID band Patient awake    Reviewed: Allergy & Precautions, H&P , NPO status , Patient's Chart, lab work & pertinent test results, reviewed documented beta blocker date and time   Airway Mallampati: III   Neck ROM: full    Dental  (+) Poor Dentition, Teeth Intact   Pulmonary neg shortness of breath, sleep apnea , COPD,    Pulmonary exam normal        Cardiovascular Exercise Tolerance: Poor hypertension, On Medications + angina at rest + CAD, + Past MI, + Peripheral Vascular Disease and +CHF  negative cardio ROS Normal cardiovascular exam Rhythm:regular Rate:Normal  Incident of fleeting chest pain at rest last night.  Resolved Spontaneously.  No crescendo pattern and such episodes Are reportedly very rare.  JA   Neuro/Psych PSYCHIATRIC DISORDERS  Neuromuscular disease negative neurological ROS  negative psych ROS   GI/Hepatic negative GI ROS, Neg liver ROS, hiatal hernia, GERD  Medicated,  Endo/Other  negative endocrine ROSdiabetes  Renal/GU Renal diseasenegative Renal ROS  negative genitourinary   Musculoskeletal   Abdominal   Peds  Hematology negative hematology ROS (+)   Anesthesia Other Findings Past Medical History: No date: Anginal pain (HCC)     Comment:  last pm No date: Atrial fibrillation ([place])     Comment:  a. Transient during 03/2013 admission. No date: Bradycardia     Comment:  a. Bradycardia/pauses/possible Mobitz II during 03/2013               adm. Not on BB due to this. No date: CAD (coronary artery disease)     Comment:  a. s/p CABG 2002. b. Hx Cypher stent to the RCA. c.               Inf-lat STEMI 03/2013:  LHC (04/05/13):  mLAD occluded, pD1              90, apical br of Dx occluded, CFX occluded, pOM1 90-95,               RCA stents patent, diff RCA 30, S-Dx occluded, S-PDA               occluded, S-OM1 40-50, L-LAD patent, EF 40%  with inf HK.               PCI:  Promus (2.5 x 28) DES to mid to dist CFX. No date: Charcot's joint of knee No date: COPD (chronic obstructive pulmonary disease) (HCC) No date: Deep venous thrombosis (HCC)     Comment:  right lower extremity No date: Diabetes mellitus     Comment:  a. A1C 10.7 in 03/2013. No date: Diastolic CHF ([place])     Comment:  a. EF 40% by cath, 55-60% during 03/2013 adm, required IV              diuresis. No date: DVT, lower extremity, recurrent (HCC)     Comment:  a. Hx recurrent DVT per record. No date: Dyslipidemia No date: Elevated CK     Comment:  a. Pt has refused rheum workup in the past. No date: GERD (gastroesophageal reflux disease) No date: History of hiatal hernia No date: HTN (hypertension)     Comment:  x 15 years No date: Hx of cardiovascular stress test     Comment:  a. Lexiscan Myoview (03/2010):  diaph atten vs inf scar,  no ischemia, EF 47%; Low Risk. No date: Hx of echocardiogram     Comment:  a. Echo (04/08/13):  Mild LVH, EF 55-60%, restrictive               physiology, severe LAE, mild reduced RVSF, mild RAE. No date: Leg pain     Comment:  ABI 6/16:  R 1.2, L 1.1 - normal No date: Myocardial infarction ([place]) No date: Obesity No date: Peripheral neuropathy No date: Pulmonary embolism (HCC) No date: Sleep apnea Past Surgical History: No date: CORONARY ANGIOPLASTY WITH STENT PLACEMENT No date: CORONARY ARTERY BYPASS GRAFT     Comment:  4 time since 2002 No date: left knee surgery No date: VASCULAR SURGERY   Reproductive/Obstetrics negative OB ROS                             Anesthesia Physical Anesthesia Plan  ASA: IV  Anesthesia Plan: General   Post-op Pain Management:    Induction:   PONV Risk Score and Plan: 4 or greater  Airway Management Planned:   Additional Equipment:   Intra-op Plan:   Post-operative Plan:   Informed Consent: I have reviewed the patients History and  Physical, chart, labs and discussed the procedure including the risks, benefits and alternatives for the proposed anesthesia with the patient or authorized representative who has indicated his/her understanding and acceptance.   Dental Advisory Given  Plan Discussed with: CRNA  Anesthesia Plan Comments: (Plan will be for heavy sedation/ TIVA will GA/LMA as backup.  R/B discussed with patient and wife and accepted.  JA)        Anesthesia Quick Evaluation

## 2016-12-25 NOTE — OR Nursing (Signed)
Pt checked blood sugar using free style devise upon arrival to [facility]

## 2016-12-25 NOTE — Anesthesia Procedure Notes (Signed)
Date/Time: 12/25/2016 2:00 PM Performed by: Allean Found, CRNA Pre-anesthesia Checklist: Patient identified, Emergency Drugs available, Suction available, Patient being monitored and Timeout performed Patient Re-evaluated:Patient Re-evaluated prior to induction Oxygen Delivery Method: Simple face mask Dental Injury: Teeth and Oropharynx as per pre-operative assessment

## 2016-12-25 NOTE — OR Nursing (Signed)
Dr dew notified that pt stopped coumadin last Thursday and had coags drawn Friday at pre admit testing, no need to repeat PT?INR. Also notified of 5 minutes of chest pain last pm, 12 lead EKG ordered

## 2016-12-25 NOTE — H&P (Signed)
[facility] History & Physical Update  The patient was interviewed and re-examined.  The patient's previous History and Physical has been reviewed and is unchanged.  There is no change in the plan of care. We plan to proceed with the scheduled procedure.  Jason Dew, MD  12/25/2016, 10:05 AM    

## 2016-12-26 ENCOUNTER — Encounter: Payer: Self-pay | Admitting: Vascular Surgery

## 2016-12-26 LAB — GLUCOSE, CAPILLARY: Glucose-Capillary: 222 mg/dL — ABNORMAL HIGH (ref 65–99)

## 2016-12-27 ENCOUNTER — Ambulatory Visit (INDEPENDENT_AMBULATORY_CARE_PROVIDER_SITE_OTHER): Payer: Managed Care, Other (non HMO)

## 2016-12-27 DIAGNOSIS — I2119 ST elevation (STEMI) myocardial infarction involving other coronary artery of inferior wall: Secondary | ICD-10-CM

## 2016-12-27 DIAGNOSIS — Z86711 Personal history of pulmonary embolism: Secondary | ICD-10-CM

## 2016-12-27 DIAGNOSIS — Z86718 Personal history of other venous thrombosis and embolism: Secondary | ICD-10-CM

## 2016-12-27 DIAGNOSIS — Z7901 Long term (current) use of anticoagulants: Secondary | ICD-10-CM

## 2016-12-27 DIAGNOSIS — Z5181 Encounter for therapeutic drug level monitoring: Secondary | ICD-10-CM | POA: Diagnosis not present

## 2016-12-27 DIAGNOSIS — I480 Paroxysmal atrial fibrillation: Secondary | ICD-10-CM

## 2016-12-27 LAB — POCT INR: INR: 1.5

## 2017-01-02 ENCOUNTER — Encounter: Payer: Self-pay | Admitting: Physician Assistant

## 2017-01-02 ENCOUNTER — Ambulatory Visit: Payer: Managed Care, Other (non HMO) | Admitting: Physician Assistant

## 2017-01-02 VITALS — BP 101/58 | HR 64 | Ht 72.0 in | Wt 249.0 lb

## 2017-01-02 DIAGNOSIS — E119 Type 2 diabetes mellitus without complications: Secondary | ICD-10-CM

## 2017-01-02 DIAGNOSIS — I82409 Acute embolism and thrombosis of unspecified deep veins of unspecified lower extremity: Secondary | ICD-10-CM

## 2017-01-02 DIAGNOSIS — I25810 Atherosclerosis of coronary artery bypass graft(s) without angina pectoris: Secondary | ICD-10-CM

## 2017-01-02 DIAGNOSIS — I739 Peripheral vascular disease, unspecified: Secondary | ICD-10-CM | POA: Diagnosis not present

## 2017-01-02 DIAGNOSIS — E785 Hyperlipidemia, unspecified: Secondary | ICD-10-CM | POA: Diagnosis not present

## 2017-01-02 DIAGNOSIS — I10 Essential (primary) hypertension: Secondary | ICD-10-CM

## 2017-01-02 DIAGNOSIS — I5032 Chronic diastolic (congestive) heart failure: Secondary | ICD-10-CM

## 2017-01-02 DIAGNOSIS — Z794 Long term (current) use of insulin: Secondary | ICD-10-CM

## 2017-01-02 MED ORDER — LOSARTAN POTASSIUM 25 MG PO TABS: 25.0000 mg | ORAL_TABLET | Freq: Every day | ORAL | 6 refills | Status: DC

## 2017-01-02 MED ORDER — WARFARIN SODIUM 5 MG PO TABS: 10.0000 mg | ORAL_TABLET | Freq: Once | ORAL | 0 refills | Status: DC

## 2017-01-02 NOTE — Patient Instructions (Addendum)
Medication Instructions:  DECREASE Losartan to 25mg  Take 1 tablet once a day  Labwork: None   Testing/Procedures: None   Follow-Up: Keep upcoming appointment with Dr Percival Spanish  [facility] coumadin visit to after procedure  Have appointment scheduled for 7-10 days after procedure    Any Other Special Instructions Will Be Listed Below (If Applicable).  If you need a refill on your cardiac medications before your next appointment, please call your pharmacy.

## 2017-01-02 NOTE — Progress Notes (Signed)
Cardiology Office Note    Date:  01/04/2017   ID:  Bobby Lindsey, DOB September 18, 1955, MRN [id number]  PCP:  Adin Hector, MD  Cardiologist:  Dr. Percival Spanish   Chief Complaint  Patient presents with  . Fatigue    pt c/o fatigue and dizziness, pt stated he's been very tired, pt blood sugar was check today 197  . Dizziness    History of Present Illness:  Bobby Lindsey is a 61 y.o. male with PMH of CAD s/p CABG 2002 and DES to RCA, recurrent PE/DVT, HTN, HLD, DM and OSA. He was admitted in February 2015 with inferior STEMI and underwent emergent cardiac catheterization. This demonstrated patent SVG to OM1 and LIMA to LAD, occluded SVG to diagonal and SVG to PDA. EF 40% with inferior wall abnormality. Echo showed normal LV EF prior to discharge. Culprit was felt to be occluded native left circumflex which was treated with drug-eluting stent. Hospitalization complicated by transient atrial fibrillation and acute on chronic diastolic heart failure. He has a history of elevated CK and remain off of statin. After she was started on amiodarone for A. fib, he developed Mobitz II AV block, beta blocker was discontinued. He later self discontinued amiodarone thinking it was causing dyspnea. Plavix was discontinued in September 2016.   He went to the hospital ED on 09/24/2016 after noticing worsening shortness of breath with exertion and with laying flat. He is not wearing CPAP. He also noted 10 pounds weight gain. Interestingly, his BNP was 120.6, despite chest x-ray showed mild vascular congestion and increased interstitial markings raising concern for mild interstitial edema. He was treated for this CHF exacerbation, his Lasix was increased to 80 mg from 40 mg for the next 5 days. I temporally increase his Lasix to 80 mg twice a day for a few days before decreasing back to 80 mg daily.   More recently, he presented to the [facility] on 11/23/2017 with shortness of breath and weakness. He  denied any chest pain, however troponin was mildly elevated and peaked at 1.33. BNP was 428. Blood cell count of 17. The elevated troponin was felt to be due to demand ischemia in the setting of URI in the left lower extremity cellulitis. He did undergo IV diuresis as well. His discharge weight was 256 pounds. Hemoglobin A1c obtained on 11/25/2016 was 9.6. Repeat echocardiogram obtained on 11/24/2016 showed EF 55-60%, severe LAE, mild focal basal hypertrophy of the septum, PA peak pressure 35 mmHg.  I last saw the patient/17/2018, he continued to have lower extremity edema, I increased his Lasix to 80 mg in a.m. and 40 mg p.m. I also decreased his losartan to 50 mg daily. He recently underwent the procedure on 12/25/2016 and had angioplasty of left mid to distal anterior tibial artery. His left lower extremity is wrapped in dressing today. Otherwise he appears to be euvolemic on physical exam. He continued to do to have some degree of lower extremity edema, he likely will always have some. His lung is clear, there is no significant JVD. I will continue to the current dose of diuretic. His blood pressure is borderline, he is fatigued. I will decrease his losartan to 25 mg daily.   Past Medical History:  Diagnosis Date  . Anginal pain ([place])    last pm  . Atrial fibrillation ([place])    a. Transient during 03/2013 admission.  . Bradycardia    a. Bradycardia/pauses/possible Mobitz II during 03/2013 adm. Not on  BB due to this.  Marland Kitchen CAD (coronary artery disease)    a. s/p CABG 2002. b. Hx Cypher stent to the RCA. c. Inf-lat STEMI 03/2013:  LHC (04/05/13):  mLAD occluded, pD1 90, apical br of Dx occluded, CFX occluded, pOM1 90-95, RCA stents patent, diff RCA 30, S-Dx occluded, S-PDA occluded, S-OM1 40-50, L-LAD patent, EF 40% with inf HK.  PCI:  Promus (2.5 x 28) DES to mid to dist CFX.  Marland Kitchen Charcot's joint of knee   . COPD (chronic obstructive pulmonary disease) ([place])   . Deep venous thrombosis (HCC)    right  lower extremity  . Diabetes mellitus    a. A1C 10.7 in 03/2013.  . Diastolic CHF ([place])    a. EF 40% by cath, 55-60% during 03/2013 adm, required IV diuresis.  . DVT, lower extremity, recurrent ([place])    a. Hx recurrent DVT per record.  . Dyslipidemia   . Elevated CK    a. Pt has refused rheum workup in the past.  . GERD (gastroesophageal reflux disease)   . History of hiatal hernia   . HTN (hypertension)    x 15 years  . Hx of cardiovascular stress test    a. Lexiscan Myoview (03/2010):  diaph atten vs inf scar, no ischemia, EF 47%; Low Risk.  Marland Kitchen Hx of echocardiogram    a. Echo (04/08/13):  Mild LVH, EF 55-60%, restrictive physiology, severe LAE, mild reduced RVSF, mild RAE.  . Leg pain    ABI 6/16:  R 1.2, L 1.1 - normal  . Myocardial infarction ([place])   . Obesity   . Peripheral neuropathy   . Pulmonary embolism ([place])   . Sleep apnea     Past Surgical History:  Procedure Laterality Date  . CORONARY ANGIOPLASTY WITH STENT PLACEMENT    . CORONARY ARTERY BYPASS GRAFT     4 time since 2002  . LEFT HEART CATHETERIZATION WITH CORONARY/GRAFT ANGIOGRAM  04/05/2013   Procedure: LEFT HEART CATHETERIZATION WITH Beatrix Fetters;  Surgeon: Peter M Martinique, MD;  Location: [facility] CATH LAB;  Service: Cardiovascular;;  . left knee surgery    . LOWER EXTREMITY ANGIOGRAPHY Left 12/25/2016   Procedure: LOWER EXTREMITY ANGIOGRAPHY;  Surgeon: Algernon Huxley, MD;  Location: [facility] CV LAB;  Service: Cardiovascular;  Laterality: Left;  . PERCUTANEOUS CORONARY STENT INTERVENTION (PCI-S)  04/05/2013   Procedure: PERCUTANEOUS CORONARY STENT INTERVENTION (PCI-S);  Surgeon: Peter M Martinique, MD;  Location: [facility] CATH LAB;  Service: Cardiovascular;;  DES to native Mid cx  . VASCULAR SURGERY      Current Medications: Outpatient Medications Prior to Visit  Medication Sig Dispense Refill  . acetaminophen (TYLENOL) 500 MG tablet Take 1,000 mg every 8 (eight) hours as needed by mouth for mild pain or headache.      Marland Kitchen aspirin 81 MG EC tablet Take 1 tablet (81 mg total) by mouth daily. 30 tablet 0  . atorvastatin (LIPITOR) 20 MG tablet Take 20 mg daily by mouth.  7  . furosemide (LASIX) 80 MG tablet Take 40-80 mg 2 (two) times daily by mouth. Take 1 tablet (80mg ) by mouth in the morning and 1/2 tablet (40mg ) in the evening     . gabapentin (NEURONTIN) 300 MG capsule Take 600 mg daily as needed by mouth (pain in foot and ankle).     . insulin lispro (HUMALOG KWIKPEN) 100 UNIT/ML injection Inject 22-25 Units 3 (three) times daily before meals into the skin. Per sliding scale    .  isosorbide mononitrate (IMDUR) 30 MG 24 hr tablet Take 1 tablet (30 mg total) by mouth daily. 30 tablet 3  . metoprolol succinate (TOPROL-XL) 25 MG 24 hr tablet Take 0.5 tablets (12.5 mg total) by mouth daily. 15 tablet 3  . nitroGLYCERIN (NITROSTAT) 0.4 MG SL tablet Place 1 tablet (0.4 mg total) under the tongue every 5 (five) minutes as needed for chest pain. NEEDS APPOINTMENT 25 tablet 6  . pantoprazole (PROTONIX) 40 MG tablet Take 1 tablet (40 mg total) by mouth as needed (indigestion). (Patient taking differently: Take 40 mg at bedtime by mouth. ) 30 tablet 11  . potassium chloride (K-DUR,KLOR-CON) 10 MEQ tablet Take 10 mEq daily by mouth.    . silver sulfADIAZINE (SILVADENE) 1 % cream Apply 1 application daily topically. Use on his left foot before dressing    . TOUJEO SOLOSTAR 300 UNIT/ML SOPN INJECT 80 UNITS SUBCUTANEOUSLY NIGHTLY.  5  . TRULICITY 3.87 FI/4.3PI SOPN INJECT 0.75 MG SUBCUTANEOUSLY EVERY 7 (SEVEN) DAYS.  8  . losartan (COZAAR) 100 MG tablet Take 50 mg by mouth daily.    Marland Kitchen warfarin (COUMADIN) 5 MG tablet Take 1 tablet (5 mg total) by mouth one time only at 6 PM. (Patient taking differently: Take 10 mg one time only at 6 PM by mouth. ) 30 tablet 0   No facility-administered medications prior to visit.      Allergies:   Simvastatin   Social History   Socioeconomic History  . Marital status: Married    Spouse  name: None  . Number of children: None  . Years of education: None  . Highest education level: None  Social Needs  . Financial resource strain: None  . Food insecurity - worry: None  . Food insecurity - inability: None  . Transportation needs - medical: None  . Transportation needs - non-medical: None  Occupational History  . Occupation: CONSTRUCTION    Employer: [employer]    Comment: Clinical biochemist  Tobacco Use  . Smoking status: Never Smoker  . Smokeless tobacco: Never Used  Substance and Sexual Activity  . Alcohol use: Yes    Comment: rare  . Drug use: No  . Sexual activity: Yes  Other Topics Concern  . 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      Family History:  The patient's family history includes Cancer in his father; Heart disease in his mother; Hypertension in his mother and sister.   ROS:   Please see the history of present illness.    ROS All other systems reviewed and are negative.   PHYSICAL EXAM:   VS:  BP (!) 101/58   Pulse 64   Ht 6' (1.829 m)   Wt 249 lb (112.9 kg)   SpO2 96%   BMI 33.77 kg/m    GEN: Well nourished, well developed, in no acute distress  HEENT: normal  Neck: no JVD, carotid bruits, or masses Cardiac: RRR; no murmurs, rubs, or gallops,no edema  Respiratory:  clear to auscultation bilaterally, normal work of breathing GI: soft, nontender, nondistended, + BS MS: no deformity or atrophy  Skin: warm and dry, no rash Neuro:  Alert and Oriented x 3, Strength and sensation are intact Psych: euthymic mood, full affect  Wt Readings from Last 3 Encounters:  01/02/17 249 lb (112.9 kg)  12/22/16 250 lb (113.4 kg)  12/19/16 248 lb (112.5 kg)      Studies/Labs Reviewed:   EKG:  EKG is not ordered today.    Recent Labs: 08/15/2016: TSH 2.120 11/23/2016: B Natriuretic Peptide 428.6 11/24/2016: ALT 33 11/25/2016: Hemoglobin 12.3; Platelets 182 11/26/2016: Magnesium  2.0 12/06/2016: Potassium 5.1; Sodium 135 12/22/2016: BUN 23; Creatinine, Ser 1.02   Lipid Panel    Component Value Date/Time   CHOL 125 08/15/2016 1509   TRIG 365 (H) 08/15/2016 1509   HDL 31 (L) 08/15/2016 1509   CHOLHDL 4.0 08/15/2016 1509   CHOLHDL 3.6 CALC 03/11/2008 0930   VLDL 21 03/11/2008 0930   LDLCALC 21 08/15/2016 1509    Additional studies/ records that were reviewed today include:   Echo 11/24/2016 LV EF: 55% - 60%  Study Conclusions  - Left ventricle: The cavity size was normal. There was mild focal basal hypertrophy of the septum. Systolic function was normal. The estimated ejection fraction was in the range of 55% to 60%. Wall motion was normal; there were no regional wall motion abnormalities. Left ventricular diastolic function parameters were normal. - Mitral valve: Calcified annulus. - Left atrium: The atrium was severely dilated. - Pulmonary arteries: Systolic pressure was mildly increased. PA peak pressure: 35 mm Hg (S).    ASSESSMENT:    1. Coronary artery disease involving coronary bypass graft of native heart without angina pectoris   2. Chronic diastolic heart failure ([place])   3. Recurrent acute deep vein thrombosis (DVT) of lower extremity, unspecified laterality ([place])   4. Essential hypertension   5. Hyperlipidemia, unspecified hyperlipidemia type   6. Controlled type 2 diabetes mellitus without complication, with long-term current use of insulin ([place])   7. PAD (peripheral artery disease) (HCC)      PLAN:  In order of problems listed above:  1. CAD s/p CABG: an aspirin and Coumadin. He will need to reschedule his Coumadin visit as he has upcoming procedure next Wednesday which require him to hold Coumadin for several days.  2. Chronic diastolic heart failure: Recurrent episode of respiratory failure. I recently increased his Lasix to 80 mg a.m. and 40 mg p.m. He likely will continue to have some degree of lower extremity  edema  3. Recurrent DVT on Coumadin: Reschedule Coumadin clinic visit after next week's procedure.  4. Hypertension: blood pressure soft, with decreased losartan to 25 mg daily.  5. Hyperlipidemia: Continue Lipitor 20 mg daily.  6. DM 2: on Insulin  7. PAD: Recently underwent angioplasty, still have nonhealing ulcer in the left lower extremity. He has been seen by Dr. Tyson Dense of General Surgery, plan for further procedure in the OR on 01/10/2017. He has been instructed to hold Coumadin for several days prior to procedure.    Medication Adjustments/Labs and Tests Ordered: Current medicines are reviewed at length with the patient today.  Concerns regarding medicines are outlined above.  Medication changes, Labs and Tests ordered today are listed in the Patient Instructions below. Patient Instructions  Medication Instructions:  DECREASE Losartan to 25mg  Take 1 tablet once a day  Labwork: None   Testing/Procedures: None   Follow-Up: Keep upcoming appointment with Dr Percival Spanish  [facility] coumadin visit to after procedure  Have appointment scheduled for 7-10 days after procedure    Any Other Special Instructions Will Be Listed Below (If Applicable).  If you need a refill on your cardiac medications before your next appointment, please call your pharmacy.     Hilbert Corrigan, [place]  01/04/2017 2:31 PM    [facility] [address], [place], [place]  [address] Phone: [phone]; Fax: [phone]

## 2017-01-04 ENCOUNTER — Encounter: Payer: Self-pay | Admitting: Physician Assistant

## 2017-01-04 NOTE — Addendum Note (Signed)
Addended byEulas Post, Jerzy Roepke on: 01/04/2017 02:32 PM   Modules accepted: Level of Service

## 2017-01-05 DIAGNOSIS — I2510 Atherosclerotic heart disease of native coronary artery without angina pectoris: Secondary | ICD-10-CM | POA: Diagnosis present

## 2017-01-09 ENCOUNTER — Ambulatory Visit (INDEPENDENT_AMBULATORY_CARE_PROVIDER_SITE_OTHER): Payer: Managed Care, Other (non HMO) | Admitting: Vascular Surgery

## 2017-01-09 ENCOUNTER — Encounter (INDEPENDENT_AMBULATORY_CARE_PROVIDER_SITE_OTHER): Payer: Self-pay | Admitting: Vascular Surgery

## 2017-01-09 VITALS — BP 109/63 | HR 77 | Resp 16 | Wt 249.0 lb

## 2017-01-09 DIAGNOSIS — I739 Peripheral vascular disease, unspecified: Secondary | ICD-10-CM

## 2017-01-09 DIAGNOSIS — N183 Chronic kidney disease, stage 3 (moderate): Secondary | ICD-10-CM | POA: Diagnosis not present

## 2017-01-09 DIAGNOSIS — E1122 Type 2 diabetes mellitus with diabetic chronic kidney disease: Secondary | ICD-10-CM | POA: Diagnosis not present
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
## 2017-01-23 ENCOUNTER — Ambulatory Visit: Payer: Self-pay | Admitting: Physician Assistant

## 2017-01-26 ENCOUNTER — Ambulatory Visit (INDEPENDENT_AMBULATORY_CARE_PROVIDER_SITE_OTHER): Payer: Managed Care, Other (non HMO) | Admitting: Vascular Surgery

## 2017-01-26 ENCOUNTER — Encounter: Payer: Self-pay | Admitting: Cardiology

## 2017-01-30 NOTE — Progress Notes (Deleted)
Cardiology Office Note   Date:  01/30/2017   ID:  Bobby Lindsey, DOB Feb 27, 1955, MRN [id number]  PCP:  Adin Hector, MD  Cardiologist:   Minus Breeding, MD    No chief complaint on file.     History of Present Illness: Bobby Lindsey is a 61 y.o. male who presents for follow up of dyspnea.  He has a history of CAD s/p CABG 2002 and DES to RCA, recurrent PE/DVT, HTN, HLD, DM and OSA.  He was admitted in February 2015 with inferior STEMI and underwent emergent cardiac catheterization. This demonstrated patent SVG to OM1 and LIMA to LAD, occluded SVG to diagonal and SVG to PDA. EF 40% with inferior wall abnormality. Echo showed normal LV EF prior to discharge. The culprit was felt to be an occluded native left circumflex which was treated with drug-eluting stent. The hospitalization was complicated by transient atrial fibrillation and acute on chronic diastolic heart failure.  He has a history of elevated CK and remains off of statin. After he was started on amiodarone for A. fib, he developed Mobitz II AV block, beta blocker was discontinued. He later self discontinued amiodarone thinking it was causing dyspnea. Plavix was discontinued in September 2016. He was in the ED in 09/24/2016 with dyspnea and edema.  He was given diuretic and sent home.  In Nov he had PTA of the left mid to distal anterior tibial artery.   ***   He was seen in the office and had increased Lasix PO.   Since then he has done relatively well.  He has less edema than before.  The patient denies any new symptoms such as chest discomfort, neck or arm discomfort. There has been no new shortness of breath, PND or orthopnea. There have been no reported palpitations, presyncope or syncope.    Past Medical History:  Diagnosis Date  . Anginal pain ([place])    last pm  . Atrial fibrillation ([place])    a. Transient during 03/2013 admission.  . Bradycardia    a. Bradycardia/pauses/possible Mobitz II during 03/2013 adm. Not  on BB due to this.  Marland Kitchen CAD (coronary artery disease)    a. s/p CABG 2002. b. Hx Cypher stent to the RCA. c. Inf-lat STEMI 03/2013:  LHC (04/05/13):  mLAD occluded, pD1 90, apical br of Dx occluded, CFX occluded, pOM1 90-95, RCA stents patent, diff RCA 30, S-Dx occluded, S-PDA occluded, S-OM1 40-50, L-LAD patent, EF 40% with inf HK.  PCI:  Promus (2.5 x 28) DES to mid to dist CFX.  Marland Kitchen Charcot's joint of knee   . COPD (chronic obstructive pulmonary disease) ([place])   . Deep venous thrombosis (HCC)    right lower extremity  . Diabetes mellitus    a. A1C 10.7 in 03/2013.  . Diastolic CHF ([place])    a. EF 40% by cath, 55-60% during 03/2013 adm, required IV diuresis.  . DVT, lower extremity, recurrent ([place])    a. Hx recurrent DVT per record.  . Dyslipidemia   . Elevated CK    a. Pt has refused rheum workup in the past.  . GERD (gastroesophageal reflux disease)   . History of hiatal hernia   . HTN (hypertension)    x 15 years  . Hx of cardiovascular stress test    a. Lexiscan Myoview (03/2010):  diaph atten vs inf scar, no ischemia, EF 47%; Low Risk.  Marland Kitchen Hx of echocardiogram    a. Echo (04/08/13):  Mild  LVH, EF 55-60%, restrictive physiology, severe LAE, mild reduced RVSF, mild RAE.  . Leg pain    ABI 6/16:  R 1.2, L 1.1 - normal  . Myocardial infarction ([place])   . Obesity   . Peripheral neuropathy   . Pulmonary embolism ([place])   . Sleep apnea     Past Surgical History:  Procedure Laterality Date  . CORONARY ANGIOPLASTY WITH STENT PLACEMENT    . CORONARY ARTERY BYPASS GRAFT     4 time since 2002  . LEFT HEART CATHETERIZATION WITH CORONARY/GRAFT ANGIOGRAM  04/05/2013   Procedure: LEFT HEART CATHETERIZATION WITH Beatrix Fetters;  Surgeon: Peter M Martinique, MD;  Location: [facility] CATH LAB;  Service: Cardiovascular;;  . left knee surgery    . LOWER EXTREMITY ANGIOGRAPHY Left 12/25/2016   Procedure: LOWER EXTREMITY ANGIOGRAPHY;  Surgeon: Algernon Huxley, MD;  Location: [place] CV LAB;  Service:  Cardiovascular;  Laterality: Left;  . PERCUTANEOUS CORONARY STENT INTERVENTION (PCI-S)  04/05/2013   Procedure: PERCUTANEOUS CORONARY STENT INTERVENTION (PCI-S);  Surgeon: Peter M Martinique, MD;  Location: [facility] CATH LAB;  Service: Cardiovascular;;  DES to native Mid cx  . VASCULAR SURGERY       Current Outpatient Medications  Medication Sig Dispense Refill  . acetaminophen (TYLENOL) 500 MG tablet Take 1,000 mg every 8 (eight) hours as needed by mouth for mild pain or headache.     Marland Kitchen aspirin 81 MG EC tablet Take 1 tablet (81 mg total) by mouth daily. 30 tablet 0  . atorvastatin (LIPITOR) 20 MG tablet Take 20 mg daily by mouth.  7  . furosemide (LASIX) 80 MG tablet Take 40-80 mg 2 (two) times daily by mouth. Take 1 tablet (80mg ) by mouth in the morning and 1/2 tablet (40mg ) in the evening     . gabapentin (NEURONTIN) 300 MG capsule Take 600 mg daily as needed by mouth (pain in foot and ankle).     . insulin lispro (HUMALOG KWIKPEN) 100 UNIT/ML injection Inject 22-25 Units 3 (three) times daily before meals into the skin. Per sliding scale    . isosorbide mononitrate (IMDUR) 30 MG 24 hr tablet Take 1 tablet (30 mg total) by mouth daily. 30 tablet 3  . losartan (COZAAR) 25 MG tablet Take 1 tablet (25 mg total) by mouth daily. 30 tablet 6  . metoprolol succinate (TOPROL-XL) 25 MG 24 hr tablet Take 0.5 tablets (12.5 mg total) by mouth daily. 15 tablet 3  . nitroGLYCERIN (NITROSTAT) 0.4 MG SL tablet Place 1 tablet (0.4 mg total) under the tongue every 5 (five) minutes as needed for chest pain. NEEDS APPOINTMENT 25 tablet 6  . pantoprazole (PROTONIX) 40 MG tablet Take 1 tablet (40 mg total) by mouth as needed (indigestion). (Patient taking differently: Take 40 mg at bedtime by mouth. ) 30 tablet 11  . potassium chloride (K-DUR,KLOR-CON) 10 MEQ tablet Take 10 mEq daily by mouth.    Nelva Nay SOLOSTAR 300 UNIT/ML SOPN INJECT 80 UNITS SUBCUTANEOUSLY NIGHTLY.  5  . TRULICITY 7.98 XQ/1.1HE SOPN INJECT 0.75 MG  SUBCUTANEOUSLY EVERY 7 (SEVEN) DAYS.  8  . warfarin (COUMADIN) 5 MG tablet Take 2 tablets (10 mg total) by mouth one time only at 6 PM. 60 tablet 0   No current facility-administered medications for this visit.     Allergies:   Simvastatin    ROS:  Please see the history of present illness.   Otherwise, review of systems are positive for ***.   All other systems are  reviewed and negative.    PHYSICAL EXAM: VS:  There were no vitals taken for this visit. , BMI There is no height or weight on file to calculate BMI.  GENERAL:  Well appearing NECK:  No jugular venous distention, waveform within normal limits, carotid upstroke brisk and symmetric, no bruits, no thyromegaly LUNGS:  Clear to auscultation bilaterally CHEST:  Unremarkable HEART:  PMI not displaced or sustained,S1 and S2 within normal limits, no S3, no S4, no clicks, no rubs, *** murmurs ABD:  Flat, positive bowel sounds normal in frequency in pitch, no bruits, no rebound, no guarding, no midline pulsatile mass, no hepatomegaly, no splenomegaly EXT:  2 plus pulses throughout, *** edema, no cyanosis no clubbing   GENERAL:  Well appearing NECK:  No jugular venous distention, waveform within normal limits, carotid upstroke brisk and symmetric, no bruits, no thyromegaly LUNGS:  Clear to auscultation bilaterally CHEST:  Well healed sternotomy scar.  HEART:  PMI not displaced or sustained,S1 and S2 within normal limits, no S3, no S4, no clicks, no rubs, no murmurs ABD:  Flat, positive bowel sounds normal in frequency in pitch, no bruits, no rebound, no guarding, no midline pulsatile mass, no hepatomegaly, no splenomegaly EXT:  2 plus pulses throughout, mild non pitting edema, no cyanosis no clubbing, chronic venous stasis changes.    EKG:  EKG is *** ordered today. The ekg ordered today demonstrates sinus rhythm, rate ***, left bundle branch block, no acute ST-T wave changes.   Recent Labs: 08/15/2016: TSH 2.120 11/23/2016: B  Natriuretic Peptide 428.6 11/24/2016: ALT 33 11/25/2016: Hemoglobin 12.3; Platelets 182 11/26/2016: Magnesium 2.0 12/06/2016: Potassium 5.1; Sodium 135 12/22/2016: BUN 23; Creatinine, Ser 1.02   Lab Results  Component Value Date   HGBA1C 9.6 (H) 11/25/2016    Lipid Panel    Component Value Date/Time   CHOL 125 08/15/2016 1509   TRIG 365 (H) 08/15/2016 1509   HDL 31 (L) 08/15/2016 1509   CHOLHDL 4.0 08/15/2016 1509   CHOLHDL 3.6 CALC 03/11/2008 0930   VLDL 21 03/11/2008 0930   LDLCALC 21 08/15/2016 1509     Lab Results  Component Value Date   HGBA1C 9.6 (H) 11/25/2016     Wt Readings from Last 3 Encounters:  01/09/17 249 lb (112.9 kg)  01/02/17 249 lb (112.9 kg)  12/22/16 250 lb (113.4 kg)      Other studies Reviewed: Additional studies/ records that were reviewed today include: *** Review of the above records demonstrates:  ***   ASSESSMENT AND PLAN:    LOWER EXTREMITY EDEMA:  *** This is improved but his creat was up slightly earlier this month.  I will repeat a BMET.  For now he will stay on 80 mg Lasix  CAD:  *** The patient has no new sypmtoms.  No further cardiovascular testing is indicated.  We will continue with aggressive risk reduction and meds as listed.  DVT/PE:  ***  He remains on chronic warfarin.    HTN: BP is *** elevated today but he did not take his meds.    HYPERLIPIDEMIA:  Chol is excellent. ***   He will stay on meds as listed.   DM:  A1C is down to 9.6.  ***  still above 10.  However, this is being actively managed by Adin Hector, MD  OSA:  ***  He was supposed to have follow up for this with Dr. Radford Pax.   He has sleep apnea and is starting to use the CPAP  machine.  However, he is not happy with this and will need some work to be compliant.    Current medicines are reviewed at length with the patient today.  The patient does not have concerns regarding medicines.  The following changes have been made:  ***  Labs/ tests ordered  today include:  ***  No orders of the defined types were placed in this encounter.    Disposition:   FU with me *** months.       Signed, Minus Breeding, MD  01/30/2017 10:15 PM    [facility]

## 2017-02-01 ENCOUNTER — Ambulatory Visit: Payer: Managed Care, Other (non HMO) | Admitting: Cardiology

## 2017-02-12 ENCOUNTER — Telehealth: Payer: Self-pay

## 2017-02-12 NOTE — Telephone Encounter (Signed)
Pt overdue for INR check. Left message for him to call back to sched appt.

## 2017-02-15 DIAGNOSIS — Z789 Other specified health status: Secondary | ICD-10-CM | POA: Insufficient documentation

## 2017-02-15 DIAGNOSIS — Z7409 Other reduced mobility: Secondary | ICD-10-CM | POA: Insufficient documentation

## 2017-02-19 NOTE — Telephone Encounter (Signed)
Left message for pt to call back and schedule appt for INR check or come in today before 5:00 and we can work him in.

## 2017-02-21 MED ORDER — MELATONIN 3 MG PO TABS
6.00 | ORAL_TABLET | ORAL | Status: DC
Start: 2017-02-21 — End: 2017-02-21

## 2017-02-21 MED ORDER — WARFARIN SODIUM 5 MG PO TABS
5.00 | ORAL_TABLET | ORAL | Status: DC
Start: 2017-02-21 — End: 2017-02-21

## 2017-02-21 MED ORDER — INSULIN LISPRO 100 UNIT/ML ~~LOC~~ SOLN
16.00 | SUBCUTANEOUS | Status: DC
Start: 2017-02-21 — End: 2017-02-21

## 2017-02-21 MED ORDER — DEXTROSE 10 % IV SOLN
125.00 | INTRAVENOUS | Status: DC
Start: ? — End: 2017-02-21

## 2017-02-21 MED ORDER — PRAVASTATIN SODIUM 40 MG PO TABS
80.00 | ORAL_TABLET | ORAL | Status: DC
Start: 2017-02-21 — End: 2017-02-21

## 2017-02-21 MED ORDER — ASPIRIN EC 81 MG PO TBEC
81.00 | DELAYED_RELEASE_TABLET | ORAL | Status: DC
Start: 2017-02-22 — End: 2017-02-21

## 2017-02-21 MED ORDER — NYSTATIN 100000 UNIT/GM EX POWD
CUTANEOUS | Status: DC
Start: 2017-02-21 — End: 2017-02-21

## 2017-02-21 MED ORDER — POLYETHYLENE GLYCOL 3350 17 G PO PACK
17.00 g | PACK | ORAL | Status: DC
Start: ? — End: 2017-02-21

## 2017-02-21 MED ORDER — ISOSORBIDE MONONITRATE ER 30 MG PO TB24
30.00 | ORAL_TABLET | ORAL | Status: DC
Start: 2017-02-22 — End: 2017-02-21

## 2017-02-21 MED ORDER — NITROGLYCERIN 0.3 MG SL SUBL
0.30 | SUBLINGUAL_TABLET | SUBLINGUAL | Status: DC
Start: ? — End: 2017-02-21

## 2017-02-21 MED ORDER — FLUTICASONE PROPIONATE 50 MCG/ACT NA SUSP
2.00 | NASAL | Status: DC
Start: 2017-02-22 — End: 2017-02-21

## 2017-02-21 MED ORDER — FUROSEMIDE 40 MG PO TABS
80.00 | ORAL_TABLET | ORAL | Status: DC
Start: 2017-02-22 — End: 2017-02-21

## 2017-02-21 MED ORDER — GENERIC EXTERNAL MEDICATION
50.00 | Status: DC
Start: 2017-02-21 — End: 2017-02-21

## 2017-02-21 MED ORDER — INSULIN LISPRO 100 UNIT/ML ~~LOC~~ SOLN
2.00 | SUBCUTANEOUS | Status: DC
Start: 2017-02-21 — End: 2017-02-21

## 2017-02-21 MED ORDER — METOPROLOL SUCCINATE ER 25 MG PO TB24
25.00 | ORAL_TABLET | ORAL | Status: DC
Start: 2017-02-22 — End: 2017-02-21

## 2017-02-21 MED ORDER — GABAPENTIN 100 MG PO CAPS
100.00 | ORAL_CAPSULE | ORAL | Status: DC
Start: 2017-02-21 — End: 2017-02-21

## 2017-02-21 MED ORDER — PANTOPRAZOLE SODIUM 40 MG PO TBEC
40.00 | DELAYED_RELEASE_TABLET | ORAL | Status: DC
Start: 2017-02-22 — End: 2017-02-21

## 2017-02-21 MED ORDER — ACETAMINOPHEN 325 MG PO TABS
650.00 | ORAL_TABLET | ORAL | Status: DC
Start: ? — End: 2017-02-21

## 2017-02-21 MED ORDER — IPRATROPIUM-ALBUTEROL 0.5-2.5 (3) MG/3ML IN SOLN
3.00 | RESPIRATORY_TRACT | Status: DC
Start: ? — End: 2017-02-21

## 2017-02-21 MED ORDER — ENOXAPARIN SODIUM 120 MG/0.8ML ~~LOC~~ SOLN
1.00 | SUBCUTANEOUS | Status: DC
Start: 2017-02-21 — End: 2017-02-21

## 2017-02-21 MED ORDER — DOCUSATE SODIUM 100 MG PO CAPS
100.00 | ORAL_CAPSULE | ORAL | Status: DC
Start: ? — End: 2017-02-21

## 2017-02-27 ENCOUNTER — Ambulatory Visit (INDEPENDENT_AMBULATORY_CARE_PROVIDER_SITE_OTHER): Payer: Managed Care, Other (non HMO) | Admitting: Vascular Surgery

## 2017-02-27 ENCOUNTER — Encounter (INDEPENDENT_AMBULATORY_CARE_PROVIDER_SITE_OTHER): Payer: Managed Care, Other (non HMO)

## 2017-02-28 DIAGNOSIS — Z89512 Acquired absence of left leg below knee: Secondary | ICD-10-CM | POA: Insufficient documentation

## 2017-03-06 ENCOUNTER — Ambulatory Visit: Payer: Managed Care, Other (non HMO) | Attending: Physical Medicine & Rehabilitation | Admitting: Physical Therapy

## 2017-03-06 ENCOUNTER — Ambulatory Visit: Payer: Managed Care, Other (non HMO) | Admitting: Occupational Therapy

## 2017-03-06 ENCOUNTER — Encounter: Payer: Self-pay | Admitting: Physical Therapy

## 2017-03-06 ENCOUNTER — Encounter: Payer: Self-pay | Admitting: Occupational Therapy

## 2017-03-06 ENCOUNTER — Other Ambulatory Visit: Payer: Self-pay

## 2017-03-06 DIAGNOSIS — R278 Other lack of coordination: Secondary | ICD-10-CM

## 2017-03-06 DIAGNOSIS — M6281 Muscle weakness (generalized): Secondary | ICD-10-CM | POA: Diagnosis not present

## 2017-03-06 DIAGNOSIS — R262 Difficulty in walking, not elsewhere classified: Secondary | ICD-10-CM | POA: Diagnosis present
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
## 2017-03-08 ENCOUNTER — Encounter: Payer: Self-pay | Admitting: Physical Therapy

## 2017-03-08 ENCOUNTER — Ambulatory Visit: Payer: Managed Care, Other (non HMO) | Admitting: Physical Therapy

## 2017-03-08 ENCOUNTER — Ambulatory Visit: Payer: Managed Care, Other (non HMO) | Admitting: Occupational Therapy

## 2017-03-08 DIAGNOSIS — R262 Difficulty in walking, not elsewhere classified: Secondary | ICD-10-CM

## 2017-03-08 DIAGNOSIS — M6281 Muscle weakness (generalized): Secondary | ICD-10-CM

## 2017-03-08 DIAGNOSIS — R278 Other lack of coordination: Secondary | ICD-10-CM
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
## 2017-03-13 ENCOUNTER — Ambulatory Visit: Payer: Managed Care, Other (non HMO) | Admitting: Occupational Therapy

## 2017-03-13 ENCOUNTER — Encounter: Payer: Self-pay | Admitting: Physical Therapy

## 2017-03-13 ENCOUNTER — Ambulatory Visit: Payer: Managed Care, Other (non HMO) | Admitting: Physical Therapy

## 2017-03-13 DIAGNOSIS — R278 Other lack of coordination: Secondary | ICD-10-CM

## 2017-03-13 DIAGNOSIS — M6281 Muscle weakness (generalized): Secondary | ICD-10-CM

## 2017-03-13 DIAGNOSIS — R262 Difficulty in walking, not elsewhere classified: Secondary | ICD-10-CM

## 2017-03-13 NOTE — Patient Instructions (Addendum)
Axial Rotation Stretch (Sitting)    Sit with low back supported. Rotate upper body slowly: start with head, then shoulders and toward hips. Hold a stretch at point of light tension. Repeat to opposite direction. Hold __2__ seconds each side. Repeat __10__ times. Do 2____ sessions per day.  Copyright  VHI. All rights reserved.  Isometric Hip Abduction    Place tubing around thighs. Spread legs apart, tightening muscles on outside of thigh. Hold __2__ seconds while counting out loud. Repeat __15__ times. Do __2__ sessions per day.  http://[email or web address].exer.us/627   Copyright  VHI. All rights reserved.  Isometric Hip Adduction    With towel rolled between knees, press thighs together. Hold ___2_ seconds while counting out loud. Repeat __15__ times. Do ___2_ sessions per day.  http://[email or web address].exer.us/629   Copyright  VHI. All rights reserved.  Isometric Hip Extensor    Push thigh into chair. Hold __2__ seconds while counting out loud. Repeat with other leg. Repeat _15___ times. Do __2__ sessions per day.  http://[email or web address].exer.us/625   Copyright  VHI. All rights reserved.  Knee Extension (Active / Resistive ROM)    Extend knee so that lower leg is straight. Hold _2___ seconds while counting out loud. Repeat with other leg. Repeat _15___ times. Do ___2_ sessions per day.  http://[email or web address].exer.us/617   Copyright  VHI. All rights reserved.  Knee Extension (Sitting)    Sitting with residual limb supported, tighten thigh muscle and push down on knee to straighten. Hold __2__ seconds. Repeat __15__ times. Do _2___ sessions per day.  Copyright  VHI. All rights reserved.  Push-Up (Sitting) TFA    With hands on armrests of chair, press down and lift body by straightening arms. Use foot to help with balance. Hold __2__ seconds. Repeat __15__ times. Do ___2_ sessions per day.  Copyright  VHI. All rights reserved.
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
## 2017-03-14 ENCOUNTER — Ambulatory Visit (INDEPENDENT_AMBULATORY_CARE_PROVIDER_SITE_OTHER): Payer: Managed Care, Other (non HMO)

## 2017-03-14 DIAGNOSIS — Z86711 Personal history of pulmonary embolism: Secondary | ICD-10-CM | POA: Diagnosis not present

## 2017-03-14 DIAGNOSIS — I480 Paroxysmal atrial fibrillation: Secondary | ICD-10-CM

## 2017-03-14 DIAGNOSIS — Z7901 Long term (current) use of anticoagulants: Secondary | ICD-10-CM

## 2017-03-14 DIAGNOSIS — Z5181 Encounter for therapeutic drug level monitoring: Secondary | ICD-10-CM | POA: Diagnosis not present

## 2017-03-14 DIAGNOSIS — Z86718 Personal history of other venous thrombosis and embolism: Secondary | ICD-10-CM | POA: Diagnosis not present

## 2017-03-14 DIAGNOSIS — I2119 ST elevation (STEMI) myocardial infarction involving other coronary artery of inferior wall: Secondary | ICD-10-CM | POA: Diagnosis not present

## 2017-03-14 LAB — POCT INR: INR: 2.3

## 2017-03-14 NOTE — Patient Instructions (Signed)
Please continue taking current dosage of 2 tablets daily.   Recheck in 4 weeks.

## 2017-03-15 ENCOUNTER — Ambulatory Visit: Payer: Managed Care, Other (non HMO)

## 2017-03-15 ENCOUNTER — Ambulatory Visit: Payer: Managed Care, Other (non HMO) | Admitting: Occupational Therapy

## 2017-03-15 ENCOUNTER — Encounter: Payer: Self-pay | Admitting: Occupational Therapy

## 2017-03-15 DIAGNOSIS — M6281 Muscle weakness (generalized): Secondary | ICD-10-CM

## 2017-03-15 DIAGNOSIS — R278 Other lack of coordination: Secondary | ICD-10-CM

## 2017-03-15 DIAGNOSIS — R262 Difficulty in walking, not elsewhere classified: Secondary | ICD-10-CM
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
## 2017-03-20 ENCOUNTER — Ambulatory Visit: Payer: Managed Care, Other (non HMO) | Attending: Physical Medicine & Rehabilitation

## 2017-03-20 DIAGNOSIS — M6281 Muscle weakness (generalized): Secondary | ICD-10-CM | POA: Diagnosis not present

## 2017-03-20 DIAGNOSIS — R278 Other lack of coordination: Secondary | ICD-10-CM | POA: Diagnosis present

## 2017-03-20 DIAGNOSIS — R262 Difficulty in walking, not elsewhere classified: Secondary | ICD-10-CM | POA: Diagnosis present
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
## 2017-03-22 ENCOUNTER — Encounter: Payer: Self-pay | Admitting: Occupational Therapy

## 2017-03-22 ENCOUNTER — Ambulatory Visit: Payer: Managed Care, Other (non HMO) | Admitting: Occupational Therapy

## 2017-03-22 ENCOUNTER — Ambulatory Visit: Payer: Managed Care, Other (non HMO) | Admitting: Physical Therapy

## 2017-03-22 DIAGNOSIS — M6281 Muscle weakness (generalized): Secondary | ICD-10-CM
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
## 2017-03-26 ENCOUNTER — Ambulatory Visit: Payer: Managed Care, Other (non HMO)

## 2017-03-26 DIAGNOSIS — M6281 Muscle weakness (generalized): Secondary | ICD-10-CM | POA: Diagnosis not present

## 2017-03-26 DIAGNOSIS — R262 Difficulty in walking, not elsewhere classified: Secondary | ICD-10-CM

## 2017-03-26 DIAGNOSIS — R278 Other lack of coordination: Secondary | ICD-10-CM
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
## 2017-03-28 ENCOUNTER — Encounter: Payer: Self-pay | Admitting: Occupational Therapy

## 2017-03-28 ENCOUNTER — Ambulatory Visit: Payer: Managed Care, Other (non HMO)

## 2017-03-28 ENCOUNTER — Ambulatory Visit: Payer: Managed Care, Other (non HMO) | Admitting: Occupational Therapy

## 2017-03-28 DIAGNOSIS — M6281 Muscle weakness (generalized): Secondary | ICD-10-CM

## 2017-03-28 DIAGNOSIS — R278 Other lack of coordination: Secondary | ICD-10-CM

## 2017-03-28 DIAGNOSIS — R262 Difficulty in walking, not elsewhere classified: Secondary | ICD-10-CM
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
## 2017-04-03 ENCOUNTER — Ambulatory Visit: Payer: Managed Care, Other (non HMO) | Admitting: Occupational Therapy

## 2017-04-03 ENCOUNTER — Ambulatory Visit: Payer: Managed Care, Other (non HMO)

## 2017-04-04 ENCOUNTER — Ambulatory Visit: Payer: Managed Care, Other (non HMO) | Admitting: Occupational Therapy

## 2017-04-04 ENCOUNTER — Ambulatory Visit: Payer: Managed Care, Other (non HMO)

## 2017-04-04 ENCOUNTER — Encounter: Payer: Self-pay | Admitting: Occupational Therapy

## 2017-04-04 DIAGNOSIS — M6281 Muscle weakness (generalized): Secondary | ICD-10-CM

## 2017-04-04 DIAGNOSIS — R262 Difficulty in walking, not elsewhere classified: Secondary | ICD-10-CM

## 2017-04-04 DIAGNOSIS — R278 Other lack of coordination: Secondary | ICD-10-CM
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
## 2017-04-06 ENCOUNTER — Encounter: Payer: Self-pay | Admitting: Occupational Therapy

## 2017-04-06 ENCOUNTER — Ambulatory Visit: Payer: Managed Care, Other (non HMO)

## 2017-04-06 ENCOUNTER — Ambulatory Visit: Payer: Managed Care, Other (non HMO) | Admitting: Occupational Therapy

## 2017-04-06 DIAGNOSIS — R278 Other lack of coordination: Secondary | ICD-10-CM

## 2017-04-06 DIAGNOSIS — M6281 Muscle weakness (generalized): Secondary | ICD-10-CM

## 2017-04-06 DIAGNOSIS — R262 Difficulty in walking, not elsewhere classified: Secondary | ICD-10-CM
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
## 2017-04-09 ENCOUNTER — Ambulatory Visit: Payer: Managed Care, Other (non HMO)

## 2017-04-09 ENCOUNTER — Encounter: Payer: Self-pay | Admitting: Occupational Therapy

## 2017-04-09 ENCOUNTER — Ambulatory Visit: Payer: Managed Care, Other (non HMO) | Admitting: Occupational Therapy

## 2017-04-09 ENCOUNTER — Ambulatory Visit (INDEPENDENT_AMBULATORY_CARE_PROVIDER_SITE_OTHER): Payer: Managed Care, Other (non HMO)

## 2017-04-09 DIAGNOSIS — Z7901 Long term (current) use of anticoagulants: Secondary | ICD-10-CM | POA: Diagnosis not present

## 2017-04-09 DIAGNOSIS — Z86718 Personal history of other venous thrombosis and embolism: Secondary | ICD-10-CM | POA: Diagnosis not present

## 2017-04-09 DIAGNOSIS — R262 Difficulty in walking, not elsewhere classified: Secondary | ICD-10-CM

## 2017-04-09 DIAGNOSIS — M6281 Muscle weakness (generalized): Secondary | ICD-10-CM | POA: Diagnosis not present

## 2017-04-09 DIAGNOSIS — Z86711 Personal history of pulmonary embolism: Secondary | ICD-10-CM | POA: Diagnosis not present

## 2017-04-09 DIAGNOSIS — I2119 ST elevation (STEMI) myocardial infarction involving other coronary artery of inferior wall: Secondary | ICD-10-CM | POA: Diagnosis not present

## 2017-04-09 DIAGNOSIS — R278 Other lack of coordination: Secondary | ICD-10-CM

## 2017-04-09 DIAGNOSIS — Z5181 Encounter for therapeutic drug level monitoring: Secondary | ICD-10-CM

## 2017-04-09 DIAGNOSIS — I480 Paroxysmal atrial fibrillation: Secondary | ICD-10-CM

## 2017-04-09 LAB — POCT INR: INR: 3.7
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
## 2017-04-09 NOTE — Patient Instructions (Signed)
Please skip coumadin today, then take 1 tablet per day for the rest of the week (since you're on the antibiotic), then on Saturday resume taking current dosage of 2 tablets daily.   Recheck in 1 & 1/2 weeks.

## 2017-04-10 ENCOUNTER — Encounter: Payer: Self-pay | Admitting: Physical Therapy

## 2017-04-10 ENCOUNTER — Ambulatory Visit: Payer: Managed Care, Other (non HMO)

## 2017-04-10 ENCOUNTER — Ambulatory Visit: Payer: Managed Care, Other (non HMO) | Admitting: Occupational Therapy

## 2017-04-10 ENCOUNTER — Encounter: Payer: Self-pay | Admitting: Occupational Therapy

## 2017-04-10 DIAGNOSIS — M6281 Muscle weakness (generalized): Secondary | ICD-10-CM

## 2017-04-10 DIAGNOSIS — R262 Difficulty in walking, not elsewhere classified: Secondary | ICD-10-CM

## 2017-04-10 DIAGNOSIS — R278 Other lack of coordination: Secondary | ICD-10-CM
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
## 2017-04-11 ENCOUNTER — Ambulatory Visit: Payer: Managed Care, Other (non HMO)

## 2017-04-11 ENCOUNTER — Ambulatory Visit: Payer: Managed Care, Other (non HMO) | Admitting: Occupational Therapy

## 2017-04-16 ENCOUNTER — Ambulatory Visit: Payer: Managed Care, Other (non HMO) | Admitting: Occupational Therapy

## 2017-04-16 ENCOUNTER — Ambulatory Visit: Payer: Managed Care, Other (non HMO)

## 2017-04-18 ENCOUNTER — Ambulatory Visit: Payer: Managed Care, Other (non HMO) | Admitting: Occupational Therapy

## 2017-04-18 ENCOUNTER — Ambulatory Visit: Payer: Managed Care, Other (non HMO)

## 2017-04-18 ENCOUNTER — Ambulatory Visit (INDEPENDENT_AMBULATORY_CARE_PROVIDER_SITE_OTHER): Payer: Managed Care, Other (non HMO)

## 2017-04-18 DIAGNOSIS — Z5181 Encounter for therapeutic drug level monitoring: Secondary | ICD-10-CM

## 2017-04-18 DIAGNOSIS — I2119 ST elevation (STEMI) myocardial infarction involving other coronary artery of inferior wall: Secondary | ICD-10-CM

## 2017-04-18 DIAGNOSIS — Z86711 Personal history of pulmonary embolism: Secondary | ICD-10-CM

## 2017-04-18 DIAGNOSIS — I480 Paroxysmal atrial fibrillation: Secondary | ICD-10-CM

## 2017-04-18 DIAGNOSIS — Z86718 Personal history of other venous thrombosis and embolism: Secondary | ICD-10-CM

## 2017-04-18 DIAGNOSIS — Z7901 Long term (current) use of anticoagulants: Secondary | ICD-10-CM

## 2017-04-18 LAB — POCT INR: INR: 2.8

## 2017-04-18 NOTE — Patient Instructions (Signed)
Please continue taking current dosage of 2 tablets daily.   Recheck in 3 weeks.

## 2017-04-23 ENCOUNTER — Encounter: Payer: Self-pay | Admitting: Occupational Therapy

## 2017-04-23 ENCOUNTER — Ambulatory Visit: Payer: Managed Care, Other (non HMO) | Admitting: Occupational Therapy

## 2017-04-23 ENCOUNTER — Ambulatory Visit: Payer: Managed Care, Other (non HMO) | Attending: Physical Medicine & Rehabilitation

## 2017-04-23 DIAGNOSIS — M6281 Muscle weakness (generalized): Secondary | ICD-10-CM | POA: Insufficient documentation

## 2017-04-23 DIAGNOSIS — R262 Difficulty in walking, not elsewhere classified: Secondary | ICD-10-CM | POA: Diagnosis present

## 2017-04-23 DIAGNOSIS — R278 Other lack of coordination: Secondary | ICD-10-CM | POA: Insufficient documentation
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
## 2017-04-25 ENCOUNTER — Ambulatory Visit: Payer: Managed Care, Other (non HMO)

## 2017-04-25 ENCOUNTER — Ambulatory Visit: Payer: Managed Care, Other (non HMO) | Admitting: Occupational Therapy

## 2017-04-25 ENCOUNTER — Encounter: Payer: Self-pay | Admitting: Occupational Therapy

## 2017-04-25 DIAGNOSIS — M6281 Muscle weakness (generalized): Secondary | ICD-10-CM | POA: Diagnosis not present

## 2017-04-25 DIAGNOSIS — R262 Difficulty in walking, not elsewhere classified: Secondary | ICD-10-CM

## 2017-04-25 DIAGNOSIS — R278 Other lack of coordination: Secondary | ICD-10-CM
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
## 2017-04-30 ENCOUNTER — Ambulatory Visit: Payer: Managed Care, Other (non HMO)

## 2017-04-30 ENCOUNTER — Ambulatory Visit: Payer: Managed Care, Other (non HMO) | Admitting: Occupational Therapy

## 2017-04-30 ENCOUNTER — Encounter: Payer: Self-pay | Admitting: Occupational Therapy

## 2017-04-30 DIAGNOSIS — M6281 Muscle weakness (generalized): Secondary | ICD-10-CM

## 2017-04-30 DIAGNOSIS — R262 Difficulty in walking, not elsewhere classified: Secondary | ICD-10-CM

## 2017-04-30 DIAGNOSIS — R278 Other lack of coordination: Secondary | ICD-10-CM
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
## 2017-05-02 ENCOUNTER — Encounter: Payer: Self-pay | Admitting: Occupational Therapy

## 2017-05-02 ENCOUNTER — Ambulatory Visit: Payer: Managed Care, Other (non HMO)

## 2017-05-02 ENCOUNTER — Ambulatory Visit: Payer: Managed Care, Other (non HMO) | Admitting: Occupational Therapy

## 2017-05-02 DIAGNOSIS — M6281 Muscle weakness (generalized): Secondary | ICD-10-CM

## 2017-05-02 DIAGNOSIS — R262 Difficulty in walking, not elsewhere classified: Secondary | ICD-10-CM

## 2017-05-02 DIAGNOSIS — R278 Other lack of coordination: Secondary | ICD-10-CM
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
## 2017-05-07 ENCOUNTER — Ambulatory Visit: Payer: Managed Care, Other (non HMO)

## 2017-05-07 DIAGNOSIS — R262 Difficulty in walking, not elsewhere classified: Secondary | ICD-10-CM

## 2017-05-07 DIAGNOSIS — R278 Other lack of coordination: Secondary | ICD-10-CM

## 2017-05-07 DIAGNOSIS — M6281 Muscle weakness (generalized): Secondary | ICD-10-CM
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
## 2017-05-09 ENCOUNTER — Ambulatory Visit: Payer: Managed Care, Other (non HMO) | Admitting: Occupational Therapy

## 2017-05-09 ENCOUNTER — Ambulatory Visit: Payer: Managed Care, Other (non HMO)

## 2017-05-09 ENCOUNTER — Encounter: Payer: Self-pay | Admitting: Occupational Therapy

## 2017-05-09 DIAGNOSIS — R278 Other lack of coordination: Secondary | ICD-10-CM

## 2017-05-09 DIAGNOSIS — M6281 Muscle weakness (generalized): Secondary | ICD-10-CM | POA: Diagnosis not present

## 2017-05-09 DIAGNOSIS — R262 Difficulty in walking, not elsewhere classified: Secondary | ICD-10-CM

## 2017-05-09 NOTE — Therapy (Signed)
[place] MAIN [facility] SERVICES [address] [place], [place], [address] Phone: [phone]   Fax:  [phone]  Occupational Therapy Treatment  Patient Details  Name: Bobby Lindsey MRN: [id number] Date of Birth: 1955-03-27 Referring Provider: Pricilla Holm   Encounter Date: 05/09/2017  OT End of Session - 05/09/17 1026    Visit Number  15    Number of Visits  24    Date for OT Re-Evaluation  05/29/17    OT Start Time  1022    OT Stop Time  1100    OT Time Calculation (min)  38 min    Activity Tolerance  Patient tolerated treatment well    Behavior During Therapy  [facility] for tasks assessed/performed       Past Medical History:  Diagnosis Date  . Anginal pain ([place])    last pm  . Atrial fibrillation ([place])    a. Transient during 03/2013 admission.  . Bradycardia    a. Bradycardia/pauses/possible Mobitz II during 03/2013 adm. Not on BB due to this.  Marland Kitchen CAD (coronary artery disease)    a. s/p CABG 2002. b. Hx Cypher stent to the RCA. c. Inf-lat STEMI 03/2013:  LHC (04/05/13):  mLAD occluded, pD1 90, apical br of Dx occluded, CFX occluded, pOM1 90-95, RCA stents patent, diff RCA 30, S-Dx occluded, S-PDA occluded, S-OM1 40-50, L-LAD patent, EF 40% with inf HK.  PCI:  Promus (2.5 x 28) DES to mid to dist CFX.  Marland Kitchen Charcot's joint of knee   . COPD (chronic obstructive pulmonary disease) ([place])   . Deep venous thrombosis (HCC)    right lower extremity  . Diabetes mellitus    a. A1C 10.7 in 03/2013.  . Diastolic CHF ([place])    a. EF 40% by cath, 55-60% during 03/2013 adm, required IV diuresis.  . DVT, lower extremity, recurrent ([place])    a. Hx recurrent DVT per record.  . Dyslipidemia   . Elevated CK    a. Pt has refused rheum workup in the past.  . GERD (gastroesophageal reflux disease)   . History of hiatal hernia   . HTN (hypertension)    x 15 years  . Hx of cardiovascular stress test    a. Lexiscan Myoview (03/2010):  diaph atten vs inf scar, no  ischemia, EF 47%; Low Risk.  Marland Kitchen Hx of echocardiogram    a. Echo (04/08/13):  Mild LVH, EF 55-60%, restrictive physiology, severe LAE, mild reduced RVSF, mild RAE.  . Leg pain    ABI 6/16:  R 1.2, L 1.1 - normal  . Myocardial infarction ([place])   . Obesity   . Peripheral neuropathy   . Pulmonary embolism ([place])   . Sleep apnea     Past Surgical History:  Procedure Laterality Date  . CORONARY ANGIOPLASTY WITH STENT PLACEMENT    . CORONARY ARTERY BYPASS GRAFT     4 time since 2002  . LEFT HEART CATHETERIZATION WITH CORONARY/GRAFT ANGIOGRAM  04/05/2013   Procedure: LEFT HEART CATHETERIZATION WITH Beatrix Fetters;  Surgeon: Peter M Martinique, MD;  Location: [facility] CATH LAB;  Service: Cardiovascular;;  . left knee surgery    . LOWER EXTREMITY ANGIOGRAPHY Left 12/25/2016   Procedure: LOWER EXTREMITY ANGIOGRAPHY;  Surgeon: Algernon Huxley, MD;  Location: [place] CV LAB;  Service: Cardiovascular;  Laterality: Left;  . PERCUTANEOUS CORONARY STENT INTERVENTION (PCI-S)  04/05/2013   Procedure: PERCUTANEOUS CORONARY STENT INTERVENTION (PCI-S);  Surgeon: Peter M Martinique, MD;  Location: [facility] CATH  LAB;  Service: Cardiovascular;;  DES to native Mid cx  . VASCULAR SURGERY      There were no vitals filed for this visit.      OT TREATMENT  Therapeutic Exercise:  Worked on therapeutic ex.withgreentheraband. For left UE shoulder flexion, horizontal abduction,diagonal abduction, andgreen theraband forbilateral elbow flexion, and extension 1-2 sets 10-20 reps with rest breaks.  Neuromuscular re-ed:  Pt. performed [facility] skills training to improve speed and dexterity needed for ADL tasks and writing. Pt. demonstrated grasping 1 inch sticks on the Purdue pegboard. Pt. performed grasping each item with his 2nd digit and thumb, and storing them in the palm of his hand. Pt. Had difficulty performing translatory movements of the hand, and storing the 1" inch sticks. Pt. Dropped multiple sticks.                                OT Long Term Goals - 05/09/17 1041      OT LONG TERM GOAL #1   Title  Patient will complete bathing with modified independence.     Baseline  min to moderate assist at eval.     Time  12    Period  Weeks    Status  On-going    Target Date  05/29/17      OT LONG TERM GOAL #2   Title  Patient will demonstrate increase in muscle strength by 1 mm grade to assist to propel wheelchair into carpeted bathroom.     Baseline  unable at eval     Time  12    Period  Weeks    Status  On-going    Target Date  05/29/17      OT LONG TERM GOAL #3   Title  Patient will demonstrate increase in left grip strength by 10# to be able to open jars and containers with modified independence.     Baseline  unable at eval     Time  8    Period  Weeks    Status  On-going    Target Date  05/29/17      OT LONG TERM GOAL #4   Title  Patient will demonstrate LB dressing skills with modified independence.     Baseline  min assist    Time  12    Period  Weeks    Status  New    Target Date  05/29/17      OT LONG TERM GOAL #5   Title  Patient will improve fine motor coordination in left hand to pick up objects and manipulate to complete self care and work tasks.     Baseline  slow coordination skills on left, drops items frequently, decreased hand function    Time  12    Period  Weeks    Status  On-going    Target Date  05/29/17            Plan - 05/09/17 1027    Clinical Impression Statement  Pt. reports he is able to reach up to get items from from the cabinet easier for plates, glasses, grits, and oil from the top shelf. Pt. reports he is very cautious with this, but, is able to do it when using his walker to help get balanced. Pt. reports having difficulty grasping, picking objects with his hands.    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      Occupational performance deficits (Please refer to evaluation for details):  ADL's;IADL's;Social Participation    Rehab Potential  Good    OT Frequency  2x / week    OT Duration  12 weeks    OT Treatment/Interventions  Self-care/ADL training;DME and/or AE instruction;Patient/family education;Functional Mobility Training;Moist Heat;Therapeutic exercise;Manual Therapy;Therapeutic activities;Neuromuscular education    Clinical Decision Making  Several treatment options, min-mod task modification necessary    Consulted and Agree with Plan of Care  Patient       Patient will benefit from skilled therapeutic intervention in order to improve the following deficits and impairments:  Abnormal gait, Decreased endurance, Decreased knowledge of precautions, Improper body mechanics, Decreased activity tolerance, Decreased knowledge of use of DME, Decreased strength, Impaired flexibility, Decreased balance, Decreased mobility, Difficulty walking, Impaired sensation, Decreased range of motion, Decreased coordination, Impaired UE functional use  Visit Diagnosis: Muscle weakness (generalized)    Problem List Patient Active Problem List   Diagnosis Date Noted  . PAD (peripheral artery disease) ([place]) 01/09/2017  . HTN (hypertension) 12/19/2016  . Atherosclerosis of native arteries of the extremities with ulceration ([place]) 12/19/2016  . CHF (congestive heart failure) ([place]) 11/24/2016  . Demand ischemia ([place])   . 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 NSTEMI (non-ST elevated myocardial infarction) ([place]) 11/23/2016  . Acute diastolic CHF (congestive heart failure) ([place]) 11/23/2016  . CAD (coronary artery disease)   . Acute congestive heart failure ([place])    . DDD (degenerative disc disease), lumbar 03/27/2016  . History of recurrent deep vein thrombosis (DVT) 03/26/2016  . History of pulmonary embolism 10/01/2015  . Nephropathy, diabetic ([place]) 10/01/2015  . 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 History of recurrent DVT/E 04/11/2013  . Obstructive sleep apnea 04/11/2013  . Bradycardia 04/11/2013  . History of Elevated CK level  04/11/2013  . Obesity 04/11/2013  . Peripheral neuropathy ([place])   . Charcot's arthropathy   . Chronic obstructive pulmonary disease ([place])   . Chronic diastolic heart failure ([place]) 04/08/2013  . Paroxysmal atrial fibrillation ([place]) 04/07/2013  . Reactive depression (situational) 05/16/2011  . Long term (current) use of anticoagulants 09/21/2010  . 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 ED (erectile dysfunction) of organic origin 07/28/2010  . Poorly controlled type 2 diabetes mellitus with peripheral neuropathy ([place]) 05/26/2008  . Hyperlipidemia 05/26/2008  . Hypertensive heart disease 05/26/2008    Harrel Carina, MS, OTR/L 05/09/2017, 10:45 AM  [place] MAIN [facility] SERVICES [address] [place], [place], [address] Phone: [phone]   Fax:  [phone]  Name: TALLIE HEVIA MRN: [id number] Date of Birth: Feb 07, 1956
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
## 2017-05-14 ENCOUNTER — Ambulatory Visit: Payer: Managed Care, Other (non HMO) | Admitting: Occupational Therapy

## 2017-05-14 ENCOUNTER — Encounter: Payer: Self-pay | Admitting: Occupational Therapy

## 2017-05-14 ENCOUNTER — Ambulatory Visit: Payer: Managed Care, Other (non HMO) | Attending: Physical Medicine & Rehabilitation

## 2017-05-14 DIAGNOSIS — R278 Other lack of coordination: Secondary | ICD-10-CM

## 2017-05-14 DIAGNOSIS — R262 Difficulty in walking, not elsewhere classified: Secondary | ICD-10-CM | POA: Insufficient documentation

## 2017-05-14 DIAGNOSIS — M6281 Muscle weakness (generalized): Secondary | ICD-10-CM
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
## 2017-05-16 ENCOUNTER — Ambulatory Visit: Payer: Managed Care, Other (non HMO) | Admitting: Occupational Therapy

## 2017-05-16 ENCOUNTER — Ambulatory Visit (INDEPENDENT_AMBULATORY_CARE_PROVIDER_SITE_OTHER): Payer: Managed Care, Other (non HMO)

## 2017-05-16 ENCOUNTER — Ambulatory Visit: Payer: Managed Care, Other (non HMO)

## 2017-05-16 DIAGNOSIS — Z7901 Long term (current) use of anticoagulants: Secondary | ICD-10-CM | POA: Diagnosis not present

## 2017-05-16 DIAGNOSIS — I2119 ST elevation (STEMI) myocardial infarction involving other coronary artery of inferior wall: Secondary | ICD-10-CM | POA: Diagnosis not present

## 2017-05-16 DIAGNOSIS — R262 Difficulty in walking, not elsewhere classified: Secondary | ICD-10-CM

## 2017-05-16 DIAGNOSIS — M6281 Muscle weakness (generalized): Secondary | ICD-10-CM

## 2017-05-16 DIAGNOSIS — I480 Paroxysmal atrial fibrillation: Secondary | ICD-10-CM

## 2017-05-16 DIAGNOSIS — R278 Other lack of coordination: Secondary | ICD-10-CM

## 2017-05-16 DIAGNOSIS — Z5181 Encounter for therapeutic drug level monitoring: Secondary | ICD-10-CM | POA: Diagnosis not present

## 2017-05-16 DIAGNOSIS — L97929 Non-pressure chronic ulcer of unspecified part of left lower leg with unspecified severity: Secondary | ICD-10-CM

## 2017-05-16 DIAGNOSIS — Z86711 Personal history of pulmonary embolism: Secondary | ICD-10-CM

## 2017-05-16 DIAGNOSIS — E11622 Type 2 diabetes mellitus with other skin ulcer: Secondary | ICD-10-CM | POA: Insufficient documentation

## 2017-05-16 DIAGNOSIS — Z86718 Personal history of other venous thrombosis and embolism: Secondary | ICD-10-CM

## 2017-05-16 LAB — POCT INR: INR: 4.7
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
## 2017-05-16 NOTE — Patient Instructions (Signed)
Please skip coumadin tonight and tomorrow, then resume taking current dosage of 2 tablets daily.   Recheck in 1 week.
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
## 2017-05-20 ENCOUNTER — Encounter: Payer: Self-pay | Admitting: Occupational Therapy

## 2017-05-20 NOTE — Therapy (Signed)
Achilles Dunk, OTR/L, CLT  Taisa Deloria 05/20/2017, 11:03 AM  [place] MAIN [facility] SERVICES [address] [place], [place], [address] Phone: [phone]   Fax:  [phone]  Name: Bobby [facility] MRN: [id number] Date of Birth: [birth year]  [place] MAIN [facility] SERVICES [address] [place], [place], [address] Phone: [phone]   Fax:  [phone]  Occupational Therapy Treatment  Patient Details  Name: Bobby [facility] MRN: [id number] Date of Birth: May 12, 1955 Referring Provider: Pricilla Holm   Encounter Date: 05/16/2017  OT End of Session - 05/20/17 1057    Visit Number  17    Number of Visits  24    Date for OT Re-Evaluation  05/29/17    OT Start Time  1400    OT Stop Time  1445    OT Time Calculation (min)  45 min    Activity Tolerance  Patient tolerated treatment well    Behavior During Therapy  [facility] for tasks assessed/performed       Past Medical History:  Diagnosis Date  . Anginal pain ([place])    last pm  . Atrial fibrillation ([place])    a. Transient during 03/2013 admission.  . Bradycardia    a. Bradycardia/pauses/possible Mobitz II during 03/2013 adm. Not on BB due to this.  Marland Kitchen CAD (coronary artery disease)    a. s/p CABG 2002. b. Hx Cypher stent to the RCA. c. Inf-lat STEMI 03/2013:  LHC (04/05/13):  mLAD occluded, pD1 90, apical br of Dx occluded, CFX occluded, pOM1 90-95, RCA stents patent, diff RCA 30, S-Dx occluded, S-PDA occluded, S-OM1 40-50, L-LAD patent, EF 40% with inf HK.  PCI:  Promus (2.5 x 28) DES to mid to dist CFX.  Marland Kitchen Charcot's joint of knee   . COPD (chronic obstructive pulmonary disease) ([place])   . Deep venous thrombosis ([facility])    right lower extremity  . Diabetes mellitus    a. A1C 10.7 in 03/2013.  . Diastolic CHF ([place])    a. EF 40% by cath, 55-60% during 03/2013 adm, required IV diuresis.  . DVT, lower extremity, recurrent ([place])    a. Hx recurrent DVT per record.  . Dyslipidemia   . Elevated CK    a. Pt has refused rheum workup in the past.  . GERD (gastroesophageal reflux disease)   . History of hiatal hernia   . HTN (hypertension)    x 15 years  . Hx of cardiovascular stress test    a. Lexiscan Myoview (03/2010):  diaph atten vs inf scar, no  ischemia, EF 47%; Low Risk.  Marland Kitchen Hx of echocardiogram    a. Echo (04/08/13):  Mild LVH, EF 55-60%, restrictive physiology, severe LAE, mild reduced RVSF, mild RAE.  . Leg pain    ABI 6/16:  R 1.2, L 1.1 - normal  . Myocardial infarction ([place])   . Obesity   . Peripheral neuropathy   . Pulmonary embolism ([place])   . Sleep apnea     Past Surgical History:  Procedure Laterality Date  . CORONARY ANGIOPLASTY WITH STENT PLACEMENT    . CORONARY ARTERY BYPASS GRAFT     4 time since 2002  . LEFT HEART CATHETERIZATION WITH CORONARY/GRAFT ANGIOGRAM  04/05/2013   Procedure: LEFT HEART CATHETERIZATION WITH Beatrix Fetters;  Surgeon: Peter M Martinique, MD;  Location: [facility] CATH LAB;  Service: Cardiovascular;;  . left knee surgery    . LOWER EXTREMITY ANGIOGRAPHY Left 12/25/2016   Procedure: LOWER EXTREMITY ANGIOGRAPHY;  Surgeon: Algernon Huxley, MD;  Location: [place] CV LAB;  Service: Cardiovascular;  Laterality: Left;  . PERCUTANEOUS CORONARY STENT INTERVENTION (PCI-S)  04/05/2013   Procedure: PERCUTANEOUS CORONARY STENT INTERVENTION (PCI-S);  Surgeon: Peter M Martinique, MD;  Location: [facility] CATH  [place] MAIN [facility] SERVICES [address] [place], [place], [address] Phone: [phone]   Fax:  [phone]  Occupational Therapy Treatment  Patient Details  Name: Bobby [facility] MRN: [id number] Date of Birth: May 12, 1955 Referring Provider: Pricilla Holm   Encounter Date: 05/16/2017  OT End of Session - 05/20/17 1057    Visit Number  17    Number of Visits  24    Date for OT Re-Evaluation  05/29/17    OT Start Time  1400    OT Stop Time  1445    OT Time Calculation (min)  45 min    Activity Tolerance  Patient tolerated treatment well    Behavior During Therapy  [facility] for tasks assessed/performed       Past Medical History:  Diagnosis Date  . Anginal pain ([place])    last pm  . Atrial fibrillation ([place])    a. Transient during 03/2013 admission.  . Bradycardia    a. Bradycardia/pauses/possible Mobitz II during 03/2013 adm. Not on BB due to this.  Marland Kitchen CAD (coronary artery disease)    a. s/p CABG 2002. b. Hx Cypher stent to the RCA. c. Inf-lat STEMI 03/2013:  LHC (04/05/13):  mLAD occluded, pD1 90, apical br of Dx occluded, CFX occluded, pOM1 90-95, RCA stents patent, diff RCA 30, S-Dx occluded, S-PDA occluded, S-OM1 40-50, L-LAD patent, EF 40% with inf HK.  PCI:  Promus (2.5 x 28) DES to mid to dist CFX.  Marland Kitchen Charcot's joint of knee   . COPD (chronic obstructive pulmonary disease) ([place])   . Deep venous thrombosis ([facility])    right lower extremity  . Diabetes mellitus    a. A1C 10.7 in 03/2013.  . Diastolic CHF ([place])    a. EF 40% by cath, 55-60% during 03/2013 adm, required IV diuresis.  . DVT, lower extremity, recurrent ([place])    a. Hx recurrent DVT per record.  . Dyslipidemia   . Elevated CK    a. Pt has refused rheum workup in the past.  . GERD (gastroesophageal reflux disease)   . History of hiatal hernia   . HTN (hypertension)    x 15 years  . Hx of cardiovascular stress test    a. Lexiscan Myoview (03/2010):  diaph atten vs inf scar, no  ischemia, EF 47%; Low Risk.  Marland Kitchen Hx of echocardiogram    a. Echo (04/08/13):  Mild LVH, EF 55-60%, restrictive physiology, severe LAE, mild reduced RVSF, mild RAE.  . Leg pain    ABI 6/16:  R 1.2, L 1.1 - normal  . Myocardial infarction ([place])   . Obesity   . Peripheral neuropathy   . Pulmonary embolism ([place])   . Sleep apnea     Past Surgical History:  Procedure Laterality Date  . CORONARY ANGIOPLASTY WITH STENT PLACEMENT    . CORONARY ARTERY BYPASS GRAFT     4 time since 2002  . LEFT HEART CATHETERIZATION WITH CORONARY/GRAFT ANGIOGRAM  04/05/2013   Procedure: LEFT HEART CATHETERIZATION WITH Beatrix Fetters;  Surgeon: Peter M Martinique, MD;  Location: [facility] CATH LAB;  Service: Cardiovascular;;  . left knee surgery    . LOWER EXTREMITY ANGIOGRAPHY Left 12/25/2016   Procedure: LOWER EXTREMITY ANGIOGRAPHY;  Surgeon: Algernon Huxley, MD;  Location: [place] CV LAB;  Service: Cardiovascular;  Laterality: Left;  . PERCUTANEOUS CORONARY STENT INTERVENTION (PCI-S)  04/05/2013   Procedure: PERCUTANEOUS CORONARY STENT INTERVENTION (PCI-S);  Surgeon: Peter M Martinique, MD;  Location: [facility] CATH  [place] MAIN [facility] SERVICES [address] [place], [place], [address] Phone: [phone]   Fax:  [phone]  Occupational Therapy Treatment  Patient Details  Name: Bobby [facility] MRN: [id number] Date of Birth: May 12, 1955 Referring Provider: Pricilla Holm   Encounter Date: 05/16/2017  OT End of Session - 05/20/17 1057    Visit Number  17    Number of Visits  24    Date for OT Re-Evaluation  05/29/17    OT Start Time  1400    OT Stop Time  1445    OT Time Calculation (min)  45 min    Activity Tolerance  Patient tolerated treatment well    Behavior During Therapy  [facility] for tasks assessed/performed       Past Medical History:  Diagnosis Date  . Anginal pain ([place])    last pm  . Atrial fibrillation ([place])    a. Transient during 03/2013 admission.  . Bradycardia    a. Bradycardia/pauses/possible Mobitz II during 03/2013 adm. Not on BB due to this.  Marland Kitchen CAD (coronary artery disease)    a. s/p CABG 2002. b. Hx Cypher stent to the RCA. c. Inf-lat STEMI 03/2013:  LHC (04/05/13):  mLAD occluded, pD1 90, apical br of Dx occluded, CFX occluded, pOM1 90-95, RCA stents patent, diff RCA 30, S-Dx occluded, S-PDA occluded, S-OM1 40-50, L-LAD patent, EF 40% with inf HK.  PCI:  Promus (2.5 x 28) DES to mid to dist CFX.  Marland Kitchen Charcot's joint of knee   . COPD (chronic obstructive pulmonary disease) ([place])   . Deep venous thrombosis ([facility])    right lower extremity  . Diabetes mellitus    a. A1C 10.7 in 03/2013.  . Diastolic CHF ([place])    a. EF 40% by cath, 55-60% during 03/2013 adm, required IV diuresis.  . DVT, lower extremity, recurrent ([place])    a. Hx recurrent DVT per record.  . Dyslipidemia   . Elevated CK    a. Pt has refused rheum workup in the past.  . GERD (gastroesophageal reflux disease)   . History of hiatal hernia   . HTN (hypertension)    x 15 years  . Hx of cardiovascular stress test    a. Lexiscan Myoview (03/2010):  diaph atten vs inf scar, no  ischemia, EF 47%; Low Risk.  Marland Kitchen Hx of echocardiogram    a. Echo (04/08/13):  Mild LVH, EF 55-60%, restrictive physiology, severe LAE, mild reduced RVSF, mild RAE.  . Leg pain    ABI 6/16:  R 1.2, L 1.1 - normal  . Myocardial infarction ([place])   . Obesity   . Peripheral neuropathy   . Pulmonary embolism ([place])   . Sleep apnea     Past Surgical History:  Procedure Laterality Date  . CORONARY ANGIOPLASTY WITH STENT PLACEMENT    . CORONARY ARTERY BYPASS GRAFT     4 time since 2002  . LEFT HEART CATHETERIZATION WITH CORONARY/GRAFT ANGIOGRAM  04/05/2013   Procedure: LEFT HEART CATHETERIZATION WITH Beatrix Fetters;  Surgeon: Peter M Martinique, MD;  Location: [facility] CATH LAB;  Service: Cardiovascular;;  . left knee surgery    . LOWER EXTREMITY ANGIOGRAPHY Left 12/25/2016   Procedure: LOWER EXTREMITY ANGIOGRAPHY;  Surgeon: Algernon Huxley, MD;  Location: [place] CV LAB;  Service: Cardiovascular;  Laterality: Left;  . PERCUTANEOUS CORONARY STENT INTERVENTION (PCI-S)  04/05/2013   Procedure: PERCUTANEOUS CORONARY STENT INTERVENTION (PCI-S);  Surgeon: Peter M Martinique, MD;  Location: [facility] CATH

## 2017-05-21 ENCOUNTER — Ambulatory Visit: Payer: Managed Care, Other (non HMO)

## 2017-05-21 ENCOUNTER — Ambulatory Visit: Payer: Managed Care, Other (non HMO) | Admitting: Occupational Therapy

## 2017-05-23 ENCOUNTER — Ambulatory Visit: Payer: Managed Care, Other (non HMO)

## 2017-05-23 ENCOUNTER — Encounter: Payer: Self-pay | Admitting: Occupational Therapy

## 2017-05-23 ENCOUNTER — Ambulatory Visit (INDEPENDENT_AMBULATORY_CARE_PROVIDER_SITE_OTHER): Payer: Managed Care, Other (non HMO)

## 2017-05-23 ENCOUNTER — Ambulatory Visit: Payer: Managed Care, Other (non HMO) | Admitting: Occupational Therapy

## 2017-05-23 DIAGNOSIS — Z86711 Personal history of pulmonary embolism: Secondary | ICD-10-CM

## 2017-05-23 DIAGNOSIS — Z5181 Encounter for therapeutic drug level monitoring: Secondary | ICD-10-CM | POA: Diagnosis not present

## 2017-05-23 DIAGNOSIS — M6281 Muscle weakness (generalized): Secondary | ICD-10-CM

## 2017-05-23 DIAGNOSIS — Z86718 Personal history of other venous thrombosis and embolism: Secondary | ICD-10-CM | POA: Diagnosis not present

## 2017-05-23 DIAGNOSIS — Z7901 Long term (current) use of anticoagulants: Secondary | ICD-10-CM

## 2017-05-23 DIAGNOSIS — R262 Difficulty in walking, not elsewhere classified: Secondary | ICD-10-CM

## 2017-05-23 DIAGNOSIS — I480 Paroxysmal atrial fibrillation: Secondary | ICD-10-CM

## 2017-05-23 DIAGNOSIS — R278 Other lack of coordination: Secondary | ICD-10-CM

## 2017-05-23 DIAGNOSIS — I2119 ST elevation (STEMI) myocardial infarction involving other coronary artery of inferior wall: Secondary | ICD-10-CM

## 2017-05-23 LAB — POCT INR: INR: 2.5

## 2017-05-23 NOTE — Patient Instructions (Signed)
Please continue taking current dosage of 2 tablets daily.   Recheck in 2 weeks.
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
## 2017-05-28 ENCOUNTER — Ambulatory Visit: Payer: Managed Care, Other (non HMO)

## 2017-05-28 ENCOUNTER — Encounter: Payer: Self-pay | Admitting: Occupational Therapy

## 2017-05-28 ENCOUNTER — Ambulatory Visit: Payer: Managed Care, Other (non HMO) | Admitting: Occupational Therapy

## 2017-05-28 DIAGNOSIS — M6281 Muscle weakness (generalized): Secondary | ICD-10-CM

## 2017-05-28 DIAGNOSIS — R262 Difficulty in walking, not elsewhere classified: Secondary | ICD-10-CM

## 2017-05-28 DIAGNOSIS — R278 Other lack of coordination: Secondary | ICD-10-CM
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
## 2017-05-28 NOTE — Addendum Note (Signed)
Addended by: Lucia Bitter on: 05/28/2017 04:21 PM   Modules accepted: Orders
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
## 2017-05-30 ENCOUNTER — Ambulatory Visit: Payer: Managed Care, Other (non HMO)

## 2017-05-30 ENCOUNTER — Encounter: Payer: Self-pay | Admitting: Occupational Therapy

## 2017-05-30 ENCOUNTER — Ambulatory Visit: Payer: Managed Care, Other (non HMO) | Admitting: Occupational Therapy

## 2017-05-30 DIAGNOSIS — R278 Other lack of coordination: Secondary | ICD-10-CM

## 2017-05-30 DIAGNOSIS — R262 Difficulty in walking, not elsewhere classified: Secondary | ICD-10-CM

## 2017-05-30 DIAGNOSIS — M6281 Muscle weakness (generalized): Secondary | ICD-10-CM | POA: Diagnosis not present
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
## 2017-06-04 ENCOUNTER — Ambulatory Visit: Payer: Managed Care, Other (non HMO) | Admitting: Occupational Therapy

## 2017-06-04 ENCOUNTER — Ambulatory Visit: Payer: Managed Care, Other (non HMO)

## 2017-06-04 ENCOUNTER — Encounter: Payer: Self-pay | Admitting: Occupational Therapy

## 2017-06-04 DIAGNOSIS — R262 Difficulty in walking, not elsewhere classified: Secondary | ICD-10-CM

## 2017-06-04 DIAGNOSIS — R278 Other lack of coordination: Secondary | ICD-10-CM

## 2017-06-04 DIAGNOSIS — M6281 Muscle weakness (generalized): Secondary | ICD-10-CM
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
## 2017-06-05 DIAGNOSIS — T148XXA Other injury of unspecified body region, initial encounter: Secondary | ICD-10-CM | POA: Insufficient documentation

## 2017-06-06 ENCOUNTER — Ambulatory Visit: Payer: Managed Care, Other (non HMO)

## 2017-06-06 ENCOUNTER — Ambulatory Visit (INDEPENDENT_AMBULATORY_CARE_PROVIDER_SITE_OTHER): Payer: Managed Care, Other (non HMO)

## 2017-06-06 ENCOUNTER — Ambulatory Visit: Payer: Managed Care, Other (non HMO) | Admitting: Occupational Therapy

## 2017-06-06 DIAGNOSIS — Z7901 Long term (current) use of anticoagulants: Secondary | ICD-10-CM

## 2017-06-06 DIAGNOSIS — Z86718 Personal history of other venous thrombosis and embolism: Secondary | ICD-10-CM | POA: Diagnosis not present

## 2017-06-06 DIAGNOSIS — I2119 ST elevation (STEMI) myocardial infarction involving other coronary artery of inferior wall: Secondary | ICD-10-CM | POA: Diagnosis not present

## 2017-06-06 DIAGNOSIS — Z86711 Personal history of pulmonary embolism: Secondary | ICD-10-CM | POA: Diagnosis not present

## 2017-06-06 DIAGNOSIS — Z5181 Encounter for therapeutic drug level monitoring: Secondary | ICD-10-CM

## 2017-06-06 DIAGNOSIS — I480 Paroxysmal atrial fibrillation: Secondary | ICD-10-CM

## 2017-06-06 LAB — POCT INR: INR: 3.1

## 2017-06-06 NOTE — Patient Instructions (Signed)
Please have a LARGE SERVING OF GREENS TONIGHT continue taking current dosage of 2 tablets daily.   Recheck in 2 weeks.

## 2017-06-07 ENCOUNTER — Ambulatory Visit: Payer: Managed Care, Other (non HMO)

## 2017-06-07 DIAGNOSIS — R262 Difficulty in walking, not elsewhere classified: Secondary | ICD-10-CM

## 2017-06-07 DIAGNOSIS — R278 Other lack of coordination: Secondary | ICD-10-CM

## 2017-06-07 DIAGNOSIS — M6281 Muscle weakness (generalized): Secondary | ICD-10-CM
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
## 2017-06-10 ENCOUNTER — Other Ambulatory Visit: Payer: Self-pay | Admitting: Cardiology

## 2017-06-11 ENCOUNTER — Ambulatory Visit: Payer: Managed Care, Other (non HMO)

## 2017-06-11 ENCOUNTER — Encounter: Payer: Self-pay | Admitting: Occupational Therapy

## 2017-06-11 ENCOUNTER — Ambulatory Visit: Payer: Managed Care, Other (non HMO) | Admitting: Occupational Therapy

## 2017-06-11 DIAGNOSIS — M6281 Muscle weakness (generalized): Secondary | ICD-10-CM | POA: Diagnosis not present

## 2017-06-11 DIAGNOSIS — R278 Other lack of coordination: Secondary | ICD-10-CM

## 2017-06-11 DIAGNOSIS — R262 Difficulty in walking, not elsewhere classified: Secondary | ICD-10-CM
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
## 2017-06-13 ENCOUNTER — Ambulatory Visit: Payer: Managed Care, Other (non HMO) | Attending: Physical Medicine & Rehabilitation

## 2017-06-13 ENCOUNTER — Encounter: Payer: Self-pay | Admitting: Occupational Therapy

## 2017-06-13 ENCOUNTER — Ambulatory Visit: Payer: Managed Care, Other (non HMO) | Admitting: Occupational Therapy

## 2017-06-13 DIAGNOSIS — R278 Other lack of coordination: Secondary | ICD-10-CM | POA: Insufficient documentation

## 2017-06-13 DIAGNOSIS — M6281 Muscle weakness (generalized): Secondary | ICD-10-CM | POA: Insufficient documentation

## 2017-06-13 DIAGNOSIS — R262 Difficulty in walking, not elsewhere classified: Secondary | ICD-10-CM | POA: Diagnosis present
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
## 2017-06-18 ENCOUNTER — Ambulatory Visit: Payer: Managed Care, Other (non HMO)

## 2017-06-18 ENCOUNTER — Ambulatory Visit: Payer: Managed Care, Other (non HMO) | Admitting: Occupational Therapy

## 2017-06-18 ENCOUNTER — Encounter: Payer: Self-pay | Admitting: Occupational Therapy

## 2017-06-18 ENCOUNTER — Other Ambulatory Visit: Payer: Self-pay

## 2017-06-18 DIAGNOSIS — R278 Other lack of coordination: Secondary | ICD-10-CM

## 2017-06-18 DIAGNOSIS — M6281 Muscle weakness (generalized): Secondary | ICD-10-CM | POA: Diagnosis not present

## 2017-06-18 DIAGNOSIS — R262 Difficulty in walking, not elsewhere classified: Secondary | ICD-10-CM
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
## 2017-06-20 ENCOUNTER — Ambulatory Visit: Payer: Managed Care, Other (non HMO) | Admitting: Physical Therapy

## 2017-06-20 ENCOUNTER — Ambulatory Visit: Payer: Managed Care, Other (non HMO) | Admitting: Occupational Therapy

## 2017-06-20 ENCOUNTER — Encounter: Payer: Self-pay | Admitting: Occupational Therapy

## 2017-06-20 ENCOUNTER — Encounter: Payer: Self-pay | Admitting: Physical Therapy

## 2017-06-20 DIAGNOSIS — M6281 Muscle weakness (generalized): Secondary | ICD-10-CM | POA: Diagnosis not present

## 2017-06-20 DIAGNOSIS — R278 Other lack of coordination: Secondary | ICD-10-CM

## 2017-06-20 DIAGNOSIS — R262 Difficulty in walking, not elsewhere classified: Secondary | ICD-10-CM
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
## 2017-06-25 ENCOUNTER — Ambulatory Visit: Payer: Managed Care, Other (non HMO)

## 2017-06-25 ENCOUNTER — Other Ambulatory Visit: Payer: Self-pay

## 2017-06-25 ENCOUNTER — Encounter: Payer: Self-pay | Admitting: Occupational Therapy

## 2017-06-25 ENCOUNTER — Ambulatory Visit: Payer: Managed Care, Other (non HMO) | Admitting: Occupational Therapy

## 2017-06-25 DIAGNOSIS — R278 Other lack of coordination: Secondary | ICD-10-CM

## 2017-06-25 DIAGNOSIS — M6281 Muscle weakness (generalized): Secondary | ICD-10-CM

## 2017-06-25 DIAGNOSIS — R262 Difficulty in walking, not elsewhere classified: Secondary | ICD-10-CM
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
## 2017-06-27 ENCOUNTER — Ambulatory Visit: Payer: Managed Care, Other (non HMO)

## 2017-06-27 ENCOUNTER — Ambulatory Visit: Payer: Managed Care, Other (non HMO) | Admitting: Occupational Therapy

## 2017-07-02 ENCOUNTER — Encounter: Payer: Self-pay | Admitting: Physical Therapy

## 2017-07-02 ENCOUNTER — Ambulatory Visit: Payer: Managed Care, Other (non HMO) | Admitting: Physical Therapy

## 2017-07-02 ENCOUNTER — Other Ambulatory Visit: Payer: Self-pay

## 2017-07-02 ENCOUNTER — Ambulatory Visit: Payer: Managed Care, Other (non HMO) | Admitting: Occupational Therapy

## 2017-07-02 ENCOUNTER — Encounter: Payer: Self-pay | Admitting: Occupational Therapy

## 2017-07-02 DIAGNOSIS — M6281 Muscle weakness (generalized): Secondary | ICD-10-CM | POA: Diagnosis not present

## 2017-07-02 DIAGNOSIS — R278 Other lack of coordination: Secondary | ICD-10-CM

## 2017-07-02 DIAGNOSIS — R262 Difficulty in walking, not elsewhere classified: Secondary | ICD-10-CM
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
## 2017-07-04 ENCOUNTER — Ambulatory Visit: Payer: Managed Care, Other (non HMO)

## 2017-07-04 ENCOUNTER — Encounter: Payer: Self-pay | Admitting: Occupational Therapy

## 2017-07-04 ENCOUNTER — Ambulatory Visit: Payer: Managed Care, Other (non HMO) | Admitting: Occupational Therapy

## 2017-07-04 ENCOUNTER — Ambulatory Visit (INDEPENDENT_AMBULATORY_CARE_PROVIDER_SITE_OTHER): Payer: Managed Care, Other (non HMO)

## 2017-07-04 DIAGNOSIS — Z5181 Encounter for therapeutic drug level monitoring: Secondary | ICD-10-CM | POA: Diagnosis not present

## 2017-07-04 DIAGNOSIS — Z86711 Personal history of pulmonary embolism: Secondary | ICD-10-CM

## 2017-07-04 DIAGNOSIS — I480 Paroxysmal atrial fibrillation: Secondary | ICD-10-CM

## 2017-07-04 DIAGNOSIS — Z86718 Personal history of other venous thrombosis and embolism: Secondary | ICD-10-CM | POA: Diagnosis not present

## 2017-07-04 DIAGNOSIS — Z7901 Long term (current) use of anticoagulants: Secondary | ICD-10-CM

## 2017-07-04 DIAGNOSIS — R278 Other lack of coordination: Secondary | ICD-10-CM

## 2017-07-04 DIAGNOSIS — M6281 Muscle weakness (generalized): Secondary | ICD-10-CM

## 2017-07-04 DIAGNOSIS — I2119 ST elevation (STEMI) myocardial infarction involving other coronary artery of inferior wall: Secondary | ICD-10-CM

## 2017-07-04 DIAGNOSIS — R262 Difficulty in walking, not elsewhere classified: Secondary | ICD-10-CM

## 2017-07-04 LAB — POCT INR: INR: 3.4 — AB (ref 2.0–3.0)

## 2017-07-04 NOTE — Patient Instructions (Signed)
Please skip coumadin tonight, then continue taking current dosage of 2 tablets daily.   Recheck in 2 weeks.
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
## 2017-07-11 ENCOUNTER — Encounter: Payer: Self-pay | Admitting: Physical Therapy

## 2017-07-11 ENCOUNTER — Ambulatory Visit: Payer: Managed Care, Other (non HMO) | Admitting: Occupational Therapy

## 2017-07-11 ENCOUNTER — Ambulatory Visit: Payer: Managed Care, Other (non HMO)

## 2017-07-11 VITALS — BP 106/49 | HR 68

## 2017-07-11 DIAGNOSIS — R278 Other lack of coordination: Secondary | ICD-10-CM

## 2017-07-11 DIAGNOSIS — M6281 Muscle weakness (generalized): Secondary | ICD-10-CM | POA: Diagnosis not present

## 2017-07-11 DIAGNOSIS — R262 Difficulty in walking, not elsewhere classified: Secondary | ICD-10-CM
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
## 2017-07-17 ENCOUNTER — Ambulatory Visit: Payer: Managed Care, Other (non HMO) | Attending: Physical Medicine & Rehabilitation

## 2017-07-17 ENCOUNTER — Ambulatory Visit: Payer: Managed Care, Other (non HMO) | Admitting: Occupational Therapy

## 2017-07-17 ENCOUNTER — Encounter: Payer: Self-pay | Admitting: Occupational Therapy

## 2017-07-17 VITALS — BP 138/69 | HR 71

## 2017-07-17 DIAGNOSIS — R262 Difficulty in walking, not elsewhere classified: Secondary | ICD-10-CM | POA: Diagnosis present

## 2017-07-17 DIAGNOSIS — M6281 Muscle weakness (generalized): Secondary | ICD-10-CM | POA: Diagnosis not present

## 2017-07-17 DIAGNOSIS — R278 Other lack of coordination: Secondary | ICD-10-CM | POA: Insufficient documentation
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
## 2017-07-18 ENCOUNTER — Ambulatory Visit (INDEPENDENT_AMBULATORY_CARE_PROVIDER_SITE_OTHER): Payer: Managed Care, Other (non HMO)

## 2017-07-18 DIAGNOSIS — Z5181 Encounter for therapeutic drug level monitoring: Secondary | ICD-10-CM

## 2017-07-18 DIAGNOSIS — I2119 ST elevation (STEMI) myocardial infarction involving other coronary artery of inferior wall: Secondary | ICD-10-CM

## 2017-07-18 DIAGNOSIS — I480 Paroxysmal atrial fibrillation: Secondary | ICD-10-CM

## 2017-07-18 DIAGNOSIS — Z86718 Personal history of other venous thrombosis and embolism: Secondary | ICD-10-CM | POA: Diagnosis not present

## 2017-07-18 DIAGNOSIS — Z86711 Personal history of pulmonary embolism: Secondary | ICD-10-CM

## 2017-07-18 DIAGNOSIS — Z7901 Long term (current) use of anticoagulants: Secondary | ICD-10-CM | POA: Diagnosis not present

## 2017-07-18 LAB — POCT INR: INR: 3.8 — AB (ref 2.0–3.0)

## 2017-07-18 NOTE — Patient Instructions (Signed)
Please skip coumadin tonight, then START NEW DOSAGE of 2 tablets daily except 1 TABLET ON MONDAYS & FRIDAYS. Recheck in 2 weeks.

## 2017-07-19 ENCOUNTER — Ambulatory Visit: Payer: Managed Care, Other (non HMO)

## 2017-07-19 ENCOUNTER — Ambulatory Visit: Payer: Managed Care, Other (non HMO) | Admitting: Occupational Therapy

## 2017-07-19 ENCOUNTER — Encounter: Payer: Self-pay | Admitting: Occupational Therapy

## 2017-07-19 VITALS — BP 126/73 | HR 72

## 2017-07-19 DIAGNOSIS — M6281 Muscle weakness (generalized): Secondary | ICD-10-CM | POA: Diagnosis not present

## 2017-07-19 DIAGNOSIS — R278 Other lack of coordination: Secondary | ICD-10-CM

## 2017-07-19 DIAGNOSIS — R262 Difficulty in walking, not elsewhere classified: Secondary | ICD-10-CM
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
## 2017-07-19 NOTE — Patient Instructions (Signed)
Access Code: [facility]  URL: [email or web address]  Date: 07/19/2017  Prepared by: Janna Arch   Exercises  Standing Hip Extension with Counter Support - 20 reps - 2 sets - 3 hold - 1x daily - 7x weekly  Standing Hip Flexion with Counter Support - 20 reps - 2 sets - 3 hold - 1x daily - 7x weekly  Standing Hip Abduction with Counter Support - 20 reps - 2 sets - 3 hold - 1x daily - 7x weekly  Standing Knee Flexion - 10 reps - 2 sets - 5 hold - 1x daily - 7x weekly  Prone Lying with Towel Roll (Hip Flexor Stretch) (BKA) - 2 reps - 2 sets - 60 hold - 1x daily - 7x weekly

## 2017-07-24 ENCOUNTER — Ambulatory Visit: Payer: Managed Care, Other (non HMO)

## 2017-07-24 ENCOUNTER — Ambulatory Visit: Payer: Managed Care, Other (non HMO) | Admitting: Occupational Therapy

## 2017-07-26 ENCOUNTER — Ambulatory Visit: Payer: Managed Care, Other (non HMO)

## 2017-07-26 ENCOUNTER — Ambulatory Visit: Payer: Managed Care, Other (non HMO) | Admitting: Occupational Therapy

## 2017-07-30 ENCOUNTER — Ambulatory Visit: Payer: Managed Care, Other (non HMO) | Admitting: Occupational Therapy

## 2017-07-30 ENCOUNTER — Ambulatory Visit: Payer: Managed Care, Other (non HMO)

## 2017-08-01 ENCOUNTER — Ambulatory Visit: Payer: Managed Care, Other (non HMO) | Admitting: Occupational Therapy

## 2017-08-01 ENCOUNTER — Ambulatory Visit: Payer: Managed Care, Other (non HMO)

## 2017-08-06 ENCOUNTER — Encounter: Payer: Self-pay | Admitting: Occupational Therapy

## 2017-08-08 ENCOUNTER — Encounter: Payer: Self-pay | Admitting: Occupational Therapy

## 2017-08-13 ENCOUNTER — Encounter: Payer: Self-pay | Admitting: Occupational Therapy

## 2017-08-15 ENCOUNTER — Encounter: Payer: Self-pay | Admitting: Physical Therapy

## 2017-08-21 ENCOUNTER — Encounter: Payer: Self-pay | Admitting: Occupational Therapy

## 2017-08-21 ENCOUNTER — Ambulatory Visit: Payer: Self-pay

## 2017-08-22 ENCOUNTER — Ambulatory Visit (INDEPENDENT_AMBULATORY_CARE_PROVIDER_SITE_OTHER): Payer: Managed Care, Other (non HMO) | Admitting: Pharmacist

## 2017-08-22 DIAGNOSIS — Z5181 Encounter for therapeutic drug level monitoring: Secondary | ICD-10-CM | POA: Diagnosis not present

## 2017-08-22 DIAGNOSIS — I2119 ST elevation (STEMI) myocardial infarction involving other coronary artery of inferior wall: Secondary | ICD-10-CM

## 2017-08-22 DIAGNOSIS — Z86718 Personal history of other venous thrombosis and embolism: Secondary | ICD-10-CM

## 2017-08-22 DIAGNOSIS — Z7901 Long term (current) use of anticoagulants: Secondary | ICD-10-CM | POA: Diagnosis not present

## 2017-08-22 DIAGNOSIS — Z86711 Personal history of pulmonary embolism: Secondary | ICD-10-CM

## 2017-08-22 DIAGNOSIS — I480 Paroxysmal atrial fibrillation: Secondary | ICD-10-CM

## 2017-08-22 LAB — POCT INR: INR: 2.5 (ref 2.0–3.0)

## 2017-08-22 NOTE — Patient Instructions (Signed)
Description   CONTINUE of 2 tablets daily except 1 TABLET ON MONDAYS & FRIDAYS. Recheck in 3 weeks.

## 2017-08-23 ENCOUNTER — Ambulatory Visit: Payer: Managed Care, Other (non HMO) | Admitting: Occupational Therapy

## 2017-08-28 ENCOUNTER — Encounter: Payer: Self-pay | Admitting: Occupational Therapy

## 2017-08-28 ENCOUNTER — Other Ambulatory Visit: Payer: Self-pay | Admitting: Nurse Practitioner

## 2017-08-30 ENCOUNTER — Encounter: Payer: Self-pay | Admitting: Occupational Therapy

## 2017-08-31 ENCOUNTER — Other Ambulatory Visit: Payer: Self-pay | Admitting: Cardiology

## 2017-09-04 ENCOUNTER — Encounter: Payer: Self-pay | Admitting: Occupational Therapy

## 2017-09-06 ENCOUNTER — Encounter: Payer: Self-pay | Admitting: Occupational Therapy

## 2017-09-11 ENCOUNTER — Encounter: Payer: Self-pay | Admitting: Occupational Therapy

## 2017-09-12 ENCOUNTER — Ambulatory Visit (INDEPENDENT_AMBULATORY_CARE_PROVIDER_SITE_OTHER): Payer: Managed Care, Other (non HMO)

## 2017-09-12 DIAGNOSIS — Z86718 Personal history of other venous thrombosis and embolism: Secondary | ICD-10-CM

## 2017-09-12 DIAGNOSIS — Z86711 Personal history of pulmonary embolism: Secondary | ICD-10-CM | POA: Diagnosis not present

## 2017-09-12 DIAGNOSIS — I2119 ST elevation (STEMI) myocardial infarction involving other coronary artery of inferior wall: Secondary | ICD-10-CM

## 2017-09-12 DIAGNOSIS — I480 Paroxysmal atrial fibrillation: Secondary | ICD-10-CM

## 2017-09-12 DIAGNOSIS — Z5181 Encounter for therapeutic drug level monitoring: Secondary | ICD-10-CM

## 2017-09-12 DIAGNOSIS — Z7901 Long term (current) use of anticoagulants: Secondary | ICD-10-CM

## 2017-09-12 LAB — POCT INR: INR: 3.4 — AB (ref 2.0–3.0)

## 2017-09-12 NOTE — Patient Instructions (Signed)
Please skip coumadin today and then resume dosage of 2 tablets daily except 1 TABLET ON MONDAYS & FRIDAYS. Recheck in 2 weeks.

## 2017-09-13 ENCOUNTER — Encounter: Payer: Self-pay | Admitting: Occupational Therapy

## 2017-09-22 ENCOUNTER — Other Ambulatory Visit: Payer: Self-pay | Admitting: Cardiology

## 2017-09-24 ENCOUNTER — Ambulatory Visit: Payer: Managed Care, Other (non HMO) | Attending: Internal Medicine | Admitting: Physical Therapy

## 2017-09-24 DIAGNOSIS — M6281 Muscle weakness (generalized): Secondary | ICD-10-CM | POA: Insufficient documentation

## 2017-09-24 DIAGNOSIS — R2689 Other abnormalities of gait and mobility: Secondary | ICD-10-CM | POA: Insufficient documentation

## 2017-09-24 DIAGNOSIS — R293 Abnormal posture: Secondary | ICD-10-CM | POA: Insufficient documentation

## 2017-09-24 DIAGNOSIS — R296 Repeated falls: Secondary | ICD-10-CM | POA: Insufficient documentation

## 2017-09-24 DIAGNOSIS — R2681 Unsteadiness on feet: Secondary | ICD-10-CM | POA: Insufficient documentation

## 2017-09-26 ENCOUNTER — Ambulatory Visit (INDEPENDENT_AMBULATORY_CARE_PROVIDER_SITE_OTHER): Payer: Managed Care, Other (non HMO)

## 2017-09-26 DIAGNOSIS — Z5181 Encounter for therapeutic drug level monitoring: Secondary | ICD-10-CM

## 2017-09-26 DIAGNOSIS — I2119 ST elevation (STEMI) myocardial infarction involving other coronary artery of inferior wall: Secondary | ICD-10-CM

## 2017-09-26 DIAGNOSIS — Z7901 Long term (current) use of anticoagulants: Secondary | ICD-10-CM

## 2017-09-26 DIAGNOSIS — I480 Paroxysmal atrial fibrillation: Secondary | ICD-10-CM

## 2017-09-26 DIAGNOSIS — Z86718 Personal history of other venous thrombosis and embolism: Secondary | ICD-10-CM | POA: Diagnosis not present

## 2017-09-26 DIAGNOSIS — Z86711 Personal history of pulmonary embolism: Secondary | ICD-10-CM

## 2017-09-26 LAB — POCT INR: INR: 2.5 (ref 2.0–3.0)

## 2017-09-26 NOTE — Patient Instructions (Signed)
Please continue dosage of 2 tablets daily except 1 TABLET ON MONDAYS & FRIDAYS. Recheck in 4 weeks.

## 2017-10-02 ENCOUNTER — Other Ambulatory Visit: Payer: Self-pay

## 2017-10-02 ENCOUNTER — Ambulatory Visit: Payer: Managed Care, Other (non HMO) | Admitting: Physical Therapy

## 2017-10-02 DIAGNOSIS — M6281 Muscle weakness (generalized): Secondary | ICD-10-CM | POA: Diagnosis present

## 2017-10-02 DIAGNOSIS — R296 Repeated falls: Secondary | ICD-10-CM | POA: Diagnosis present

## 2017-10-02 DIAGNOSIS — R293 Abnormal posture: Secondary | ICD-10-CM

## 2017-10-02 DIAGNOSIS — R2689 Other abnormalities of gait and mobility: Secondary | ICD-10-CM

## 2017-10-02 DIAGNOSIS — R2681 Unsteadiness on feet: Secondary | ICD-10-CM | POA: Diagnosis present
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
## 2017-10-04 ENCOUNTER — Encounter: Payer: Self-pay | Admitting: Physical Therapy

## 2017-10-04 ENCOUNTER — Ambulatory Visit: Payer: Managed Care, Other (non HMO) | Admitting: Physical Therapy

## 2017-10-04 DIAGNOSIS — R2681 Unsteadiness on feet: Secondary | ICD-10-CM

## 2017-10-04 DIAGNOSIS — R293 Abnormal posture: Secondary | ICD-10-CM

## 2017-10-04 DIAGNOSIS — M6281 Muscle weakness (generalized): Secondary | ICD-10-CM | POA: Diagnosis not present

## 2017-10-04 DIAGNOSIS — R296 Repeated falls: Secondary | ICD-10-CM

## 2017-10-04 DIAGNOSIS — R2689 Other abnormalities of gait and mobility: Secondary | ICD-10-CM

## 2017-10-04 NOTE — Patient Instructions (Signed)

## 2017-10-05 NOTE — Therapy (Signed)
[facility] [address] [place], [place], [address] Phone: [phone]   Fax:  [phone]  Physical Therapy Treatment  Patient Details  Name: Bobby Lindsey MRN: [id number] Date of Birth: 01/30/1956 Referring Provider: Rhina Brackett, MD   Encounter Date: 10/04/2017  PT End of Session - 10/04/17 1700    Visit Number  2    Number of Visits  18    Authorization Type  [employer] Managed Pt has met VL. Wife reports approved for 10 additional visits for now. Wife & pt plan to apply for [place].     PT Start Time  1019    PT Stop Time  1105    PT Time Calculation (min)  46 min    Equipment Utilized During Treatment  Gait belt    Activity Tolerance  Patient tolerated treatment well;Patient limited by pain    Behavior During Therapy  WFL for tasks assessed/performed       Past Medical History:  Diagnosis Date  . Anginal pain ([place])    last pm  . Atrial fibrillation ([place])    a. Transient during 03/2013 admission.  . Bradycardia    a. Bradycardia/pauses/possible Mobitz II during 03/2013 adm. Not on BB due to this.  Marland Kitchen CAD (coronary artery disease)    a. s/p CABG 2002. b. Hx Cypher stent to the RCA. c. Inf-lat STEMI 03/2013:  LHC (04/05/13):  mLAD occluded, pD1 90, apical br of Dx occluded, CFX occluded, pOM1 90-95, RCA stents patent, diff RCA 30, S-Dx occluded, S-PDA occluded, S-OM1 40-50, L-LAD patent, EF 40% with inf HK.  PCI:  Promus (2.5 x 28) DES to mid to dist CFX.  Marland Kitchen Charcot's joint of knee   . COPD (chronic obstructive pulmonary disease) ([place])   . Deep venous thrombosis (HCC)    right lower extremity  . Diabetes mellitus    a. A1C 10.7 in 03/2013.  . Diastolic CHF ([place])    a. EF 40% by cath, 55-60% during 03/2013 adm, required IV diuresis.  . DVT, lower extremity, recurrent ([place])    a. Hx recurrent DVT per record.  . Dyslipidemia   . Elevated CK    a. Pt has refused rheum workup in the past.  .  GERD (gastroesophageal reflux disease)   . History of hiatal hernia   . HTN (hypertension)    x 15 years  . Hx of cardiovascular stress test    a. Lexiscan Myoview (03/2010):  diaph atten vs inf scar, no ischemia, EF 47%; Low Risk.  Marland Kitchen Hx of echocardiogram    a. Echo (04/08/13):  Mild LVH, EF 55-60%, restrictive physiology, severe LAE, mild reduced RVSF, mild RAE.  . Leg pain    ABI 6/16:  R 1.2, L 1.1 - normal  . Myocardial infarction ([place])   . Obesity   . Peripheral neuropathy   . Pulmonary embolism ([place])   . Sleep apnea     Past Surgical History:  Procedure Laterality Date  . CORONARY ANGIOPLASTY WITH STENT PLACEMENT    . CORONARY ARTERY BYPASS GRAFT     4 time since 2002  . LEFT HEART CATHETERIZATION WITH CORONARY/GRAFT ANGIOGRAM  04/05/2013   Procedure: LEFT HEART CATHETERIZATION WITH Beatrix Fetters;  Surgeon: Peter M Martinique, MD;  Location: [facility] CATH LAB;  Service: Cardiovascular;;  . left knee surgery    . LOWER EXTREMITY ANGIOGRAPHY Left 12/25/2016   Procedure: LOWER EXTREMITY ANGIOGRAPHY;  Surgeon: Algernon Huxley, MD;  Location:  [facility] CV LAB;  Service: Cardiovascular;  Laterality: Left;  . PERCUTANEOUS CORONARY STENT INTERVENTION (PCI-S)  04/05/2013   Procedure: PERCUTANEOUS CORONARY STENT INTERVENTION (PCI-S);  Surgeon: Peter M Martinique, MD;  Location: [facility] CATH LAB;  Service: Cardiovascular;;  DES to native Mid cx  . VASCULAR SURGERY      There were no vitals filed for this visit.  Subjective Assessment - 10/04/17 1021    Subjective  No new complaints. No falls or pain to report.     Pertinent History  L TTA, DM2, perepheral neuropathy, PVD, CAD, CABG X3, CHF, DVTs, HTN, HLD, A-Fib,     Limitations  Standing;House hold activities;Walking    Currently in Pain?  No/denies            [facility] Adult PT Treatment/Exercise - 10/04/17 1022      Transfers   Transfers  Sit to Stand;Stand to Sit    Sit to Stand  5: Supervision;With upper extremity assist;With  armrests;From chair/3-in-1;From elevated surface    Sit to Stand Details  Verbal cues for sequencing;Verbal cues for precautions/safety    Sit to Stand Details (indicate cue type and reason)  needs UE support to stabilize with standing    Stand to Sit  5: Supervision;With upper extremity assist;With armrests;To elevated surface;To chair/3-in-1    Stand to Sit Details (indicate cue type and reason)  Verbal cues for precautions/safety;Verbal cues for sequencing    Stand to Sit Details  needs UE support for stability with sitting down.       Ambulation/Gait   Ambulation/Gait  Yes    Ambulation/Gait Assistance  4: Min guard;4: Min assist    Ambulation/Gait Assistance Details  cues on posture, step length and wewight shiting.     Ambulation Distance (Feet)  30 Feet   x2, 40 x2   Assistive device  Prosthesis;Rolling walker    Gait Pattern  Step-through pattern;Decreased stride length    Ambulation Surface  Level;Indoor      Neuro Re-ed    Neuro Re-ed Details   educated pt on and issued sink HEP for balance/proprioception. min guard assist for balance at sink with cues on ex form/technique.      Prosthetics   Prosthetic Care Comments   continued to educated and reinforced consistency with wearing schedule and gradual build up of wear times for skin integrity and assistance with pain management. both pt and spouse verbalized understanding.                Current prosthetic wear tolerance (days/week)   daily    Current prosthetic wear tolerance (#hours/day)   inconsistent with wear times.     Residual limb condition   limb checked with intact Tegaderm over wounds at distal end. no drainage noted or odor noted.     Education Provided  Residual limb care;Proper Donning;Proper Doffing;Proper wear schedule/adjustment;Proper weight-bearing schedule/adjustment;Correct ply sock adjustment   use of baby oil, gradual progression of wear times   Person(s) Educated  Patient;Spouse    Education Method   Explanation;Demonstration;Tactile cues;Handout;Verbal cues    Education Method  Verbalized understanding;Tactile cues required;Verbal cues required;Needs further instruction    Donning Prosthesis  Supervision    Doffing Prosthesis  Supervision           PT Short Term Goals - 10/02/17 1800      PT SHORT TERM GOAL #1   Title  Patient demonstrates proper donning of prosthesis & verbalizes proper cleaning. (All STGs Target Date: 11/02/2017)  Baseline       Time  1    Period  Months    Status  New    Target Date  11/02/17      PT SHORT TERM GOAL #2   Title  Patient tolerates wear of prosthesis >8hrs total per day without increase in skin issues & limb pain </= 5/10.    Baseline       Time  1    Status  New    Target Date  11/02/17      PT SHORT TERM GOAL #3   Title  Patient ambulates 150' with RW & prosthesis with supervision.     Baseline       Time  1    Period  Months    Status  New    Target Date  11/02/17      PT SHORT TERM GOAL #4   Title  Patient negotiates ramps, curbs with RW & stairs with 2 rails with prosthesis with minimal guard.     Time  1    Period  Months    Status  New    Target Date  11/02/17      PT SHORT TERM GOAL #5   Title  Patient performs standing balance with RW support & prosthesis reaching 10" anteriorly, picking up 1# object from floor & scanning environment with supervision, no balance losses.     Time  1    Period  Months    Status  New    Target Date  11/02/17        PT Long Term Goals - 10/02/17 1800      PT LONG TERM GOAL #1   Title  Patient verbalizes & demonstrates proper prosthetic care to enable safe use of prosthesis. (All LTGs Target Date: 12/28/2017)    Baseline       Time  3    Period  Months    Status  New    Target Date  12/28/17      PT LONG TERM GOAL #2   Title  Patient tolerates wear of prosthesis >90% of awake hours without skin issues or limb pain >2/10 to enable function throughout his day.     Baseline         Time  3    Period  Months    Status  New    Target Date  12/28/17      PT LONG TERM GOAL #3   Title  Berg Balance >36/56 to indicate lower fall risk & less dependency in standing ADLs.     Baseline        Time  3    Period  Months    Status  New    Target Date  12/28/17      PT LONG TERM GOAL #4   Title  Patient ambulates 500' outdoors including paved & grass surfaces with LRAD & prosthesis modified independent to enable community mobility.     Baseline        Time  3    Period  Months    Status  New    Target Date  12/28/17      PT LONG TERM GOAL #5   Title  Patient negotiates ramps, curbs & stairs with LRAD & prosthesis modified independent to enable community access.     Baseline        Time  3    Period  Months    Status  New    Target Date  12/28/17      PT LONG TERM GOAL #6   Title  Patient ambulates 100' around furniture carrying plate or cup with cane or less & prosthesis modified independent.     Baseline        Time  3    Period  Months    Status  New    Target Date  12/28/17            Plan - 10/04/17 1700    Clinical Impression Statement  Today's skilled session continued to address prosthetic education, use of prosthesis with gait with RW and issued sink HEP for balance/proprioception. Despite sock ply adjustment pt continued to report pain at distal knee area with weight bearing, will continue to monitor this. Pt is progressing toward goals and should benefit from continued PT to progress toward unmet goals.     Rehab Potential  Good    Clinical Impairments Affecting Rehab Potential  weakness BUEs, BLEs & trunk, impaired balance with high fall risk, dependent gait with prosthesis, dependent in proper prosthetic care & use    PT Frequency  2x / week   2x/wk for 5 weeks, then 1x/wk for 8 weeks   PT Duration  Other (comment)   13 weeks (3 months)   PT Treatment/Interventions  ADLs/Self Care Home Management;Canalith Repostioning;DME Instruction;Gait  training;Stair training;Functional mobility training;Therapeutic activities;Therapeutic exercise;Balance training;Patient/family education;Neuromuscular re-education;Prosthetic Training;Manual techniques;Vestibular    PT Next Visit Plan  review prosthetic care, check wounds,prosthetic gait with RW with emphasis on weight shifting and progressing to reciprocal stepping pattern    Consulted and Agree with Plan of Care  Patient;Family member/caregiver    Family Member Consulted  Amy Gladden, wife       Patient will benefit from skilled therapeutic intervention in order to improve the following deficits and impairments:  Abnormal gait, Decreased activity tolerance, Decreased balance, Decreased coordination, Decreased endurance, Decreased knowledge of use of DME, Decreased mobility, Decreased range of motion, Decreased strength, Dizziness, Impaired flexibility, Prosthetic Dependency, Postural dysfunction, Pain  Visit Diagnosis: Muscle weakness (generalized)  Other abnormalities of gait and mobility  Unsteadiness on feet  Abnormal posture  Repeated falls     Problem List Patient Active Problem List   Diagnosis Date Noted  . PAD (peripheral artery disease) ([place]) 01/09/2017  . HTN (hypertension) 12/19/2016  . Atherosclerosis of native arteries of the extremities with ulceration ([place]) 12/19/2016  . CHF (congestive heart failure) ([place]) 11/24/2016  . Demand ischemia ([place])   . 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 Stage 3 chronic kidney disease due to diabetes mellitus ([place]) 11/23/2016  . NSTEMI (non-ST elevated myocardial infarction) ([place]) 11/23/2016  . Acute diastolic CHF (congestive heart failure) ([place]) 11/23/2016  . CAD (coronary artery disease)   . Acute congestive heart failure ([place])   . DDD (degenerative disc disease), lumbar 03/27/2016  . History of recurrent deep vein thrombosis (DVT) 03/26/2016  . History of pulmonary embolism 10/01/2015  . Nephropathy, diabetic  ([place]) 10/01/2015  . 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 History of recurrent DVT/E 04/11/2013  . Obstructive sleep apnea 04/11/2013  . Bradycardia 04/11/2013  . History of Elevated CK level  04/11/2013  . Obesity 04/11/2013  . Peripheral neuropathy ([place])   . Charcot's arthropathy   . Chronic obstructive pulmonary disease ([place])   . Chronic diastolic heart failure ([place]) 04/08/2013  . Paroxysmal atrial fibrillation ([place]) 04/07/2013  . Reactive depression (situational) 05/16/2011  . Long term (  current) use of anticoagulants 09/21/2010  . 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 ED (erectile dysfunction) of organic origin 07/28/2010  . Poorly controlled type 2 diabetes mellitus with peripheral neuropathy ([place]) 05/26/2008  . Hyperlipidemia 05/26/2008  . Hypertensive heart disease 05/26/2008    Willow Ora, PTA, [facility] [address] [place], [place] [address] [phone] 10/05/17, 10:14 AM   Name: Bobby Lindsey MRN: [id number] Date of Birth: [birth year]

## 2017-10-08 ENCOUNTER — Encounter: Payer: Self-pay | Admitting: Physical Therapy

## 2017-10-08 ENCOUNTER — Ambulatory Visit: Payer: Managed Care, Other (non HMO) | Admitting: Physical Therapy

## 2017-10-08 DIAGNOSIS — R293 Abnormal posture: Secondary | ICD-10-CM

## 2017-10-08 DIAGNOSIS — R2681 Unsteadiness on feet: Secondary | ICD-10-CM

## 2017-10-08 DIAGNOSIS — R2689 Other abnormalities of gait and mobility: Secondary | ICD-10-CM

## 2017-10-08 DIAGNOSIS — M6281 Muscle weakness (generalized): Secondary | ICD-10-CM | POA: Diagnosis not present

## 2017-10-08 NOTE — Therapy (Signed)
[facility] [address] [place], [place], [address] Phone: [phone]   Fax:  [phone]  Physical Therapy Treatment  Patient Details  Name: Bobby Lindsey MRN: [id number] Date of Birth: 1955/12/09 Referring Provider: Rhina Brackett, MD   Encounter Date: 10/08/2017  PT End of Session - 10/08/17 7253    Visit Number  3    Number of Visits  18    Authorization Type  [employer] Managed Pt has met VL. Wife reports approved for 10 additional visits for now. Wife & pt plan to apply for [place].     Authorization - Visit Number  3    Authorization - Number of Visits  10    PT Start Time  [id number]   pt arrived late for appt today   PT Stop Time  0930    PT Time Calculation (min)  39 min    Equipment Utilized During Treatment  Gait belt    Activity Tolerance  Patient tolerated treatment well;No increased pain    Behavior During Therapy  WFL for tasks assessed/performed       Past Medical History:  Diagnosis Date  . Anginal pain ([place])    last pm  . Atrial fibrillation ([place])    a. Transient during 03/2013 admission.  . Bradycardia    a. Bradycardia/pauses/possible Mobitz II during 03/2013 adm. Not on BB due to this.  Marland Kitchen CAD (coronary artery disease)    a. s/p CABG 2002. b. Hx Cypher stent to the RCA. c. Inf-lat STEMI 03/2013:  LHC (04/05/13):  mLAD occluded, pD1 90, apical br of Dx occluded, CFX occluded, pOM1 90-95, RCA stents patent, diff RCA 30, S-Dx occluded, S-PDA occluded, S-OM1 40-50, L-LAD patent, EF 40% with inf HK.  PCI:  Promus (2.5 x 28) DES to mid to dist CFX.  Marland Kitchen Charcot's joint of knee   . COPD (chronic obstructive pulmonary disease) ([place])   . Deep venous thrombosis (H[facility])    right lower extremity  . Diabetes mellitus    a. A1C 10.7 in 03/2013.  . Diastolic CHF ([place])    a. EF 40% by cath, 55-60% during 03/2013 adm, required IV diuresis.  . DVT, lower extremity, recurrent ([place])    a. Hx recurrent  DVT per record.  . Dyslipidemia   . Elevated CK    a. Pt has refused rheum workup in the past.  . GERD (gastroesophageal reflux disease)   . History of hiatal hernia   . HTN (hypertension)    x 15 years  . Hx of cardiovascular stress test    a. Lexiscan Myoview (03/2010):  diaph atten vs inf scar, no ischemia, EF 47%; Low Risk.  Marland Kitchen Hx of echocardiogram    a. Echo (04/08/13):  Mild LVH, EF 55-60%, restrictive physiology, severe LAE, mild reduced RVSF, mild RAE.  . Leg pain    ABI 6/16:  R 1.2, L 1.1 - normal  . Myocardial infarction ([place])   . Obesity   . Peripheral neuropathy   . Pulmonary embolism ([place])   . Sleep apnea     Past Surgical History:  Procedure Laterality Date  . CORONARY ANGIOPLASTY WITH STENT PLACEMENT    . CORONARY ARTERY BYPASS GRAFT     4 time since 2002  . LEFT HEART CATHETERIZATION WITH CORONARY/GRAFT ANGIOGRAM  04/05/2013   Procedure: LEFT HEART CATHETERIZATION WITH Beatrix Fetters;  Surgeon: Peter M Martinique, MD;  Location: [facility] CATH LAB;  Service: Cardiovascular;;  . left  knee surgery    . LOWER EXTREMITY ANGIOGRAPHY Left 12/25/2016   Procedure: LOWER EXTREMITY ANGIOGRAPHY;  Surgeon: Algernon Huxley, MD;  Location: [facility] CV LAB;  Service: Cardiovascular;  Laterality: Left;  . PERCUTANEOUS CORONARY STENT INTERVENTION (PCI-S)  04/05/2013   Procedure: PERCUTANEOUS CORONARY STENT INTERVENTION (PCI-S);  Surgeon: Peter M Martinique, MD;  Location: [facility] CATH LAB;  Service: Cardiovascular;;  DES to native Mid cx  . VASCULAR SURGERY      There were no vitals filed for this visit.  Subjective Assessment - 10/08/17 0852    Subjective  No new complaints. No falls or pain to report.     Pertinent History  L TTA, DM2, perepheral neuropathy, PVD, CAD, CABG X3, CHF, DVTs, HTN, HLD, A-Fib,     Limitations  Standing;House hold activities;Walking    Patient Stated Goals  To use prosthesis to walk, hunt, fish, return to work [employer] business)    Currently in  Pain?  No/denies           [facility] Adult PT Treatment/Exercise - 10/08/17 0853      Transfers   Transfers  Sit to Stand;Stand to Sit    Sit to Stand  5: Supervision;With upper extremity assist;With armrests;From chair/3-in-1;From elevated surface    Sit to Stand Details  Verbal cues for sequencing;Verbal cues for precautions/safety    Sit to Stand Details (indicate cue type and reason)  needs UE support to stabilize with standing. cues on hand placement for safety.     Stand to Sit  5: Supervision;With upper extremity assist;With armrests;To elevated surface;To chair/3-in-1    Stand to Sit Details (indicate cue type and reason)  Verbal cues for precautions/safety;Verbal cues for sequencing    Stand to Sit Details  UE support needed to stabilize when reaching back to sit down. cues to keep RW close when pivot turning to chair to sit down.       Ambulation/Gait   Ambulation/Gait  Yes    Ambulation/Gait Assistance  4: Min guard    Ambulation/Gait Assistance Details  cues on posture, to incr base of support (tends to keep prosthesis centered), for increased weight shifting and walker position. all of these improved with repition and distance.     Ambulation Distance (Feet)  65 Feet   x1, 70 x1, 85 x2   Assistive device  Prosthesis;Rolling walker    Gait Pattern  Step-through pattern;Decreased stride length;Decreased stance time - left;Decreased step length - right;Decreased weight shift to left;Antalgic;Narrow base of support;Trunk flexed    Ambulation Surface  Level;Indoor      Prosthetics   Prosthetic Care Comments   continued to reinforce consistency with wear time of prosthesis. Continued to address sock ply management for improved comfort.     Current prosthetic wear tolerance (days/week)   daily    Current prosthetic wear tolerance (#hours/day)   between 3-4 hours 1-2 times a day    Residual limb condition   limb checked with intact Tegaderm over wounds at distal end. no drainage  noted or odor noted. appears to be healing with granulation forming in open wound, scab dried on other wound.      Education Provided  Residual limb care;Proper Donning;Proper Doffing;Proper wear schedule/adjustment;Proper weight-bearing schedule/adjustment;Correct ply sock adjustment    Person(s) Educated  Patient;Spouse    Education Method  Explanation;Demonstration;Verbal cues    Education Method  Verbalized understanding;Returned demonstration;Verbal cues required;Needs further Land Prosthesis  Supervision    Doffing Prosthesis  Supervision        PT Short Term Goals - 10/02/17 1800      PT SHORT TERM GOAL #1   Title  Patient demonstrates proper donning of prosthesis & verbalizes proper cleaning. (All STGs Target Date: 11/02/2017)    Baseline       Time  1    Period  Months    Status  New    Target Date  11/02/17      PT SHORT TERM GOAL #2   Title  Patient tolerates wear of prosthesis >8hrs total per day without increase in skin issues & limb pain </= 5/10.    Baseline       Time  1    Status  New    Target Date  11/02/17      PT SHORT TERM GOAL #3   Title  Patient ambulates 150' with RW & prosthesis with supervision.     Baseline       Time  1    Period  Months    Status  New    Target Date  11/02/17      PT SHORT TERM GOAL #4   Title  Patient negotiates ramps, curbs with RW & stairs with 2 rails with prosthesis with minimal guard.     Time  1    Period  Months    Status  New    Target Date  11/02/17      PT SHORT TERM GOAL #5   Title  Patient performs standing balance with RW support & prosthesis reaching 10" anteriorly, picking up 1# object from floor & scanning environment with supervision, no balance losses.     Time  1    Period  Months    Status  New    Target Date  11/02/17        PT Long Term Goals - 10/02/17 1800      PT LONG TERM GOAL #1   Title  Patient verbalizes & demonstrates proper prosthetic care to enable safe use of  prosthesis. (All LTGs Target Date: 12/28/2017)    Baseline       Time  3    Period  Months    Status  New    Target Date  12/28/17      PT LONG TERM GOAL #2   Title  Patient tolerates wear of prosthesis >90% of awake hours without skin issues or limb pain >2/10 to enable function throughout his day.     Baseline        Time  3    Period  Months    Status  New    Target Date  12/28/17      PT LONG TERM GOAL #3   Title  Berg Balance >36/56 to indicate lower fall risk & less dependency in standing ADLs.     Baseline        Time  3    Period  Months    Status  New    Target Date  12/28/17      PT LONG TERM GOAL #4   Title  Patient ambulates 500' outdoors including paved & grass surfaces with LRAD & prosthesis modified independent to enable community mobility.     Baseline        Time  3    Period  Months    Status  New    Target Date  12/28/17      PT LONG TERM GOAL #  5   Title  Patient negotiates ramps, curbs & stairs with LRAD & prosthesis modified independent to enable community access.     Baseline        Time  3    Period  Months    Status  New    Target Date  12/28/17      PT LONG TERM GOAL #6   Title  Patient ambulates 100' around furniture carrying plate or cup with cane or less & prosthesis modified independent.     Baseline        Time  3    Period  Months    Status  New    Target Date  12/28/17         Plan - 10/08/17 0853    Clinical Impression Statement  Today's skilled session continued to address prosthetic education and gait with prosthesis/RW. Pt progressed from step to gait pattern to reciprocal stepping pattern with cues/skilled instruction. Pt is to start walking short distances at home with RW. Pt is progressing toward goals and should benefit from continued PT to progress toward unmet goals.     Rehab Potential  Good    Clinical Impairments Affecting Rehab Potential  weakness BUEs, BLEs & trunk, impaired balance with high fall risk, dependent  gait with prosthesis, dependent in proper prosthetic care & use    PT Frequency  2x / week   2x/wk for 5 weeks, then 1x/wk for 8 weeks   PT Duration  Other (comment)   13 weeks (3 months)   PT Treatment/Interventions  ADLs/Self Care Home Management;Canalith Repostioning;DME Instruction;Gait training;Stair training;Functional mobility training;Therapeutic activities;Therapeutic exercise;Balance training;Patient/family education;Neuromuscular re-education;Prosthetic Training;Manual techniques;Vestibular    PT Next Visit Plan  review prosthetic care, check wounds,prosthetic gait with RW, stairs and other barriers    Consulted and Agree with Plan of Care  Patient;Family member/caregiver    Family Member Consulted  Amy Penning, wife       Patient will benefit from skilled therapeutic intervention in order to improve the following deficits and impairments:  Abnormal gait, Decreased activity tolerance, Decreased balance, Decreased coordination, Decreased endurance, Decreased knowledge of use of DME, Decreased mobility, Decreased range of motion, Decreased strength, Dizziness, Impaired flexibility, Prosthetic Dependency, Postural dysfunction, Pain  Visit Diagnosis: Muscle weakness (generalized)  Other abnormalities of gait and mobility  Unsteadiness on feet  Abnormal posture     Problem List Patient Active Problem List   Diagnosis Date Noted  . PAD (peripheral artery disease) ([place]) 01/09/2017  . HTN (hypertension) 12/19/2016  . Atherosclerosis of native arteries of the extremities with ulceration ([place]) 12/19/2016  . CHF (congestive heart failure) ([place]) 11/24/2016  . Demand ischemia ([place])   . 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 NSTEMI (non-ST elevated myocardial infarction) ([place]) 11/23/2016  . Acute diastolic CHF (congestive heart failure) ([place]) 11/23/2016  . CAD (coronary artery disease)   .  Acute congestive heart failure ([place])   . DDD (degenerative disc disease), lumbar 03/27/2016  . History of recurrent deep vein thrombosis (DVT) 03/26/2016  . History of pulmonary embolism 10/01/2015  . Nephropathy, diabetic ([place]) 10/01/2015  . 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 History of recurrent DVT/E 04/11/2013  . Obstructive sleep apnea 04/11/2013  . Bradycardia 04/11/2013  . History of Elevated CK level  04/11/2013  . Obesity 04/11/2013  . Peripheral neuropathy ([place])   . Charcot's arthropathy   . Chronic obstructive  pulmonary disease ([place])   . Chronic diastolic heart failure ([place]) 04/08/2013  . Paroxysmal atrial fibrillation ([place]) 04/07/2013  . Reactive depression (situational) 05/16/2011  . Long term (current) use of anticoagulants 09/21/2010  . 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 ED (erectile dysfunction) of organic origin 07/28/2010  . Poorly controlled type 2 diabetes mellitus with peripheral neuropathy ([place]) 05/26/2008  . Hyperlipidemia 05/26/2008  . Hypertensive heart disease 05/26/2008    Willow Ora, PTA, [facility] [address] [place],  [address] [phone] 10/08/17, 1:13 PM   Name: Bobby Lindsey MRN: [id number] Date of Birth: 1955/12/19

## 2017-10-10 ENCOUNTER — Encounter: Payer: Self-pay | Admitting: Physical Therapy

## 2017-10-11 ENCOUNTER — Encounter: Payer: Self-pay | Admitting: Physical Therapy

## 2017-10-12 ENCOUNTER — Ambulatory Visit: Payer: Managed Care, Other (non HMO) | Admitting: Physical Therapy

## 2017-10-12 ENCOUNTER — Encounter: Payer: Self-pay | Admitting: Physical Therapy

## 2017-10-12 DIAGNOSIS — M6281 Muscle weakness (generalized): Secondary | ICD-10-CM

## 2017-10-12 DIAGNOSIS — R293 Abnormal posture: Secondary | ICD-10-CM

## 2017-10-12 DIAGNOSIS — R2681 Unsteadiness on feet: Secondary | ICD-10-CM

## 2017-10-12 DIAGNOSIS — R2689 Other abnormalities of gait and mobility: Secondary | ICD-10-CM

## 2017-10-12 NOTE — Patient Instructions (Signed)
Use antiperspirant below knee after placing Tegaderm over wound areas.   Use baby oil/ mineral oil over knee cap and thigh.  You have an appt with Ronalee Belts on Friday Sept 6th at 1 pm [facility] office) for alignment check and possibly have pre tibial pads added to socket.

## 2017-10-13 NOTE — Therapy (Signed)
[facility] [address] [place], [place], [address] Phone: [phone]   Fax:  [phone]  Physical Therapy Treatment  Patient Details  Name: Bobby Lindsey MRN: [id number] Date of Birth: 1955-09-17 Referring Provider: Rhina Brackett, MD   Encounter Date: 10/12/2017  PT End of Session - 10/12/17 1107    Visit Number  4    Number of Visits  18    Authorization Type  [employer] Managed Pt has met VL. Wife reports approved for 10 additional visits for now. Wife & pt plan to apply for [place].     Authorization - Visit Number  4    Authorization - Number of Visits  10    PT Start Time  8882    PT Stop Time  1145    PT Time Calculation (min)  41 min    Equipment Utilized During Treatment  Gait belt    Activity Tolerance  Patient tolerated treatment well;No increased pain    Behavior During Therapy  WFL for tasks assessed/performed       Past Medical History:  Diagnosis Date  . Anginal pain ([place])    last pm  . Atrial fibrillation ([place])    a. Transient during 03/2013 admission.  . Bradycardia    a. Bradycardia/pauses/possible Mobitz II during 03/2013 adm. Not on BB due to this.  Marland Kitchen CAD (coronary artery disease)    a. s/p CABG 2002. b. Hx Cypher stent to the RCA. c. Inf-lat STEMI 03/2013:  LHC (04/05/13):  mLAD occluded, pD1 90, apical br of Dx occluded, CFX occluded, pOM1 90-95, RCA stents patent, diff RCA 30, S-Dx occluded, S-PDA occluded, S-OM1 40-50, L-LAD patent, EF 40% with inf HK.  PCI:  Promus (2.5 x 28) DES to mid to dist CFX.  Marland Kitchen Charcot's joint of knee   . COPD (chronic obstructive pulmonary disease) ([place])   . Deep venous thrombosis (H[facility])    right lower extremity  . Diabetes mellitus    a. A1C 10.7 in 03/2013.  . Diastolic CHF ([place])    a. EF 40% by cath, 55-60% during 03/2013 adm, required IV diuresis.  . DVT, lower extremity, recurrent ([place])    a. Hx recurrent DVT per record.  . Dyslipidemia    . Elevated CK    a. Pt has refused rheum workup in the past.  . GERD (gastroesophageal reflux disease)   . History of hiatal hernia   . HTN (hypertension)    x 15 years  . Hx of cardiovascular stress test    a. Lexiscan Myoview (03/2010):  diaph atten vs inf scar, no ischemia, EF 47%; Low Risk.  Marland Kitchen Hx of echocardiogram    a. Echo (04/08/13):  Mild LVH, EF 55-60%, restrictive physiology, severe LAE, mild reduced RVSF, mild RAE.  . Leg pain    ABI 6/16:  R 1.2, L 1.1 - normal  . Myocardial infarction ([place])   . Obesity   . Peripheral neuropathy   . Pulmonary embolism ([place])   . Sleep apnea     Past Surgical History:  Procedure Laterality Date  . CORONARY ANGIOPLASTY WITH STENT PLACEMENT    . CORONARY ARTERY BYPASS GRAFT     4 time since 2002  . LEFT HEART CATHETERIZATION WITH CORONARY/GRAFT ANGIOGRAM  04/05/2013   Procedure: LEFT HEART CATHETERIZATION WITH Beatrix Fetters;  Surgeon: Peter M Martinique, MD;  Location: [facility] CATH LAB;  Service: Cardiovascular;;  . left knee surgery    . LOWER  EXTREMITY ANGIOGRAPHY Left 12/25/2016   Procedure: LOWER EXTREMITY ANGIOGRAPHY;  Surgeon: Algernon Huxley, MD;  Location: [facility] LAB;  Service: Cardiovascular;  Laterality: Left;  . PERCUTANEOUS CORONARY STENT INTERVENTION (PCI-S)  04/05/2013   Procedure: PERCUTANEOUS CORONARY STENT INTERVENTION (PCI-S);  Surgeon: Peter M Martinique, MD;  Location: [facility] CATH LAB;  Service: Cardiovascular;;  DES to native Mid cx  . VASCULAR SURGERY      There were no vitals filed for this visit.  Subjective Assessment - 10/12/17 1107    Subjective  No new complaints. No falls or pain to report.     Pertinent History  L TTA, DM2, perepheral neuropathy, PVD, CAD, CABG X3, CHF, DVTs, HTN, HLD, A-Fib,     Limitations  Standing;House hold activities;Walking    Patient Stated Goals  To use prosthesis to walk, hunt, fish, return to work [employer] cabin [employer] business)    Currently in Pain?  No/denies            [facility] Adult PT Treatment/Exercise - 10/12/17 1108      Transfers   Transfers  Sit to Stand;Stand to Sit    Sit to Stand  5: Supervision;With upper extremity assist;With armrests;From chair/3-in-1;From elevated surface    Sit to Stand Details  Verbal cues for sequencing;Verbal cues for precautions/safety    Sit to Stand Details (indicate cue type and reason)  needs UE support to stabilize/balance with standing    Stand to Sit  5: Supervision;With upper extremity assist;With armrests;To elevated surface;To chair/3-in-1    Stand to Sit Details (indicate cue type and reason)  Verbal cues for precautions/safety;Verbal cues for sequencing    Stand to Sit Details  uses walker for stability with sitting down      Ambulation/Gait   Ambulation/Gait  Yes    Ambulation/Gait Assistance  5: Supervision    Ambulation/Gait Assistance Details  cues on posture, step length and weight shifting with gait    Ambulation Distance (Feet)  115 Feet   x1, plus around gym with barriers   Assistive device  Prosthesis;Rolling walker    Gait Pattern  Step-through pattern;Decreased stride length;Decreased stance time - left;Decreased step length - right;Decreased weight shift to left;Antalgic;Narrow base of support;Trunk flexed    Ambulation Surface  Level;Indoor    Stairs  Yes    Stairs Assistance  4: Min guard    Stairs Assistance Details (indicate cue type and reason)  PTA demo'd prior to pt performance. cues on sequencing and weight shifting needed with pt performance.    Stair Management Technique  Two rails;Step to pattern;Forwards    Number of Stairs  4    Height of Stairs  6    Ramp  4: Min assist    Ramp Details (indicate cue type and reason)  PTA demo'd prior to pt performance;  cues on posture, sequencing and technique needed    Curb  4: Min assist    Curb Details (indicate cue type and reason)  PTA demo'd prior to pt performance. cues needed on sequencing and stance position for increased  balance with walker advancement      Prosthetics   Prosthetic Care Comments   appt scheduled for pt to see prosthetist for alignment check and possible need of tibial pads, set for next Friday at 1pm at [place] office.     Current prosthetic wear tolerance (days/week)   daily    Current prosthetic wear tolerance (#hours/day)   between 3-4 hours 1-2 times a day  Residual limb condition   limb checked, wounds apear to be healing with scabs present. loose scab tissue removed with forceps/scissors. Tegaderm reapplied.     Education Provided  Residual limb care;Proper Donning;Proper Doffing;Proper wear schedule/adjustment;Proper weight-bearing schedule/adjustment;Correct ply sock adjustment    Person(s) Educated  Patient    Education Method  Explanation;Demonstration;Verbal cues    Education Method  Verbalized understanding;Returned demonstration;Verbal cues required;Needs further Land Prosthesis  Supervision    Doffing Prosthesis  Supervision           PT Short Term Goals - 10/02/17 1800      PT SHORT TERM GOAL #1   Title  Patient demonstrates proper donning of prosthesis & verbalizes proper cleaning. (All STGs Target Date: 11/02/2017)    Baseline       Time  1    Period  Months    Status  New    Target Date  11/02/17      PT SHORT TERM GOAL #2   Title  Patient tolerates wear of prosthesis >8hrs total per day without increase in skin issues & limb pain </= 5/10.    Baseline       Time  1    Status  New    Target Date  11/02/17      PT SHORT TERM GOAL #3   Title  Patient ambulates 150' with RW & prosthesis with supervision.     Baseline       Time  1    Period  Months    Status  New    Target Date  11/02/17      PT SHORT TERM GOAL #4   Title  Patient negotiates ramps, curbs with RW & stairs with 2 rails with prosthesis with minimal guard.     Time  1    Period  Months    Status  New    Target Date  11/02/17      PT SHORT TERM GOAL #5   Title   Patient performs standing balance with RW support & prosthesis reaching 10" anteriorly, picking up 1# object from floor & scanning environment with supervision, no balance losses.     Time  1    Period  Months    Status  New    Target Date  11/02/17        PT Long Term Goals - 10/02/17 1800      PT LONG TERM GOAL #1   Title  Patient verbalizes & demonstrates proper prosthetic care to enable safe use of prosthesis. (All LTGs Target Date: 12/28/2017)    Baseline       Time  3    Period  Months    Status  New    Target Date  12/28/17      PT LONG TERM GOAL #2   Title  Patient tolerates wear of prosthesis >90% of awake hours without skin issues or limb pain >2/10 to enable function throughout his day.     Baseline        Time  3    Period  Months    Status  New    Target Date  12/28/17      PT LONG TERM GOAL #3   Title  Berg Balance >36/56 to indicate lower fall risk & less dependency in standing ADLs.     Baseline        Time  3    Period  Months  Status  New    Target Date  12/28/17      PT LONG TERM GOAL #4   Title  Patient ambulates 500' outdoors including paved & grass surfaces with LRAD & prosthesis modified independent to enable community mobility.     Baseline        Time  3    Period  Months    Status  New    Target Date  12/28/17      PT LONG TERM GOAL #5   Title  Patient negotiates ramps, curbs & stairs with LRAD & prosthesis modified independent to enable community access.     Baseline        Time  3    Period  Months    Status  New    Target Date  12/28/17      PT LONG TERM GOAL #6   Title  Patient ambulates 100' around furniture carrying plate or cup with cane or less & prosthesis modified independent.     Baseline        Time  3    Period  Months    Status  New    Target Date  12/28/17          Plan - 10/12/17 1107    Clinical Impression Statement  Today's skilled session continued to focus on prosthetic education. Pt's wound appears to be  healing. Pt with increased overall gait distance today before needing a rest break. Initiated barrier instruction today with pt showing good carryover after skilled instruction. Pt is progressing toward goals and should benefit from continued PT to progress toward unmet goals.     Rehab Potential  Good    Clinical Impairments Affecting Rehab Potential  weakness BUEs, BLEs & trunk, impaired balance with high fall risk, dependent gait with prosthesis, dependent in proper prosthetic care & use    PT Frequency  2x / week   2x/wk for 5 weeks, then 1x/wk for 8 weeks   PT Duration  Other (comment)   13 weeks (3 months)   PT Treatment/Interventions  ADLs/Self Care Home Management;[place] Repostioning;DME Instruction;Gait training;Stair training;Functional mobility training;Therapeutic activities;Therapeutic exercise;Balance training;Patient/family education;Neuromuscular re-education;Prosthetic Training;Manual techniques;Vestibular    PT Next Visit Plan  check wounds,prosthetic gait with RW, stairs and other barriers, work on balance with decreased UE support    Consulted and Agree with Plan of Care  Patient;Family member/caregiver    Family Member Consulted  Amy Luecke, wife       Patient will benefit from skilled therapeutic intervention in order to improve the following deficits and impairments:  Abnormal gait, Decreased activity tolerance, Decreased balance, Decreased coordination, Decreased endurance, Decreased knowledge of use of DME, Decreased mobility, Decreased range of motion, Decreased strength, Dizziness, Impaired flexibility, Prosthetic Dependency, Postural dysfunction, Pain  Visit Diagnosis: Muscle weakness (generalized)  Other abnormalities of gait and mobility  Unsteadiness on feet  Abnormal posture     Problem List Patient Active Problem List   Diagnosis Date Noted  . PAD (peripheral artery disease) ([place]) 01/09/2017  . HTN (hypertension) 12/19/2016  . Atherosclerosis of  native arteries of the extremities with ulceration ([place]) 12/19/2016  . CHF (congestive heart failure) ([place]) 11/24/2016  . Demand ischemia ([place])   . 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 NSTEMI (non-ST elevated myocardial infarction) ([place]) 11/23/2016  . Acute diastolic CHF (congestive heart failure) ([place]) 11/23/2016  . CAD (coronary artery  disease)   . Acute congestive heart failure ([place])   . DDD (degenerative disc disease), lumbar 03/27/2016  . History of recurrent deep vein thrombosis (DVT) 03/26/2016  . History of pulmonary embolism 10/01/2015  . Nephropathy, diabetic ([place]) 10/01/2015  . 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 History of recurrent DVT/E 04/11/2013  . Obstructive sleep apnea 04/11/2013  . Bradycardia 04/11/2013  . History of Elevated CK level  04/11/2013  . Obesity 04/11/2013  . Peripheral neuropathy ([place])   . Charcot's arthropathy   . Chronic obstructive pulmonary disease ([place])   . Chronic diastolic heart failure ([place]) 04/08/2013  . Paroxysmal atrial fibrillation ([place]) 04/07/2013  . Reactive depression (situational) 05/16/2011  . Long term (current) use of anticoagulants 09/21/2010  . 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 ED (erectile dysfunction) of organic origin 07/28/2010  . Poorly controlled type 2 diabetes mellitus with peripheral neuropathy ([place]) 05/26/2008  . Hyperlipidemia 05/26/2008  . Hypertensive heart disease 05/26/2008    Willow Ora, PTA, [facility] [address], [place], [place] [address] [phone] 10/13/17, 4:52 PM   Name: Bobby Lindsey MRN: [id number] Date of Birth: April 06, 1955

## 2017-10-17 ENCOUNTER — Encounter: Payer: Self-pay | Admitting: Physical Therapy

## 2017-10-17 ENCOUNTER — Ambulatory Visit: Payer: Managed Care, Other (non HMO) | Attending: Physical Medicine & Rehabilitation | Admitting: Physical Therapy

## 2017-10-17 DIAGNOSIS — R296 Repeated falls: Secondary | ICD-10-CM | POA: Diagnosis present

## 2017-10-17 DIAGNOSIS — R2689 Other abnormalities of gait and mobility: Secondary | ICD-10-CM | POA: Insufficient documentation

## 2017-10-17 DIAGNOSIS — R293 Abnormal posture: Secondary | ICD-10-CM | POA: Diagnosis present

## 2017-10-17 DIAGNOSIS — R2681 Unsteadiness on feet: Secondary | ICD-10-CM | POA: Insufficient documentation

## 2017-10-17 DIAGNOSIS — M6281 Muscle weakness (generalized): Secondary | ICD-10-CM | POA: Diagnosis present

## 2017-10-18 NOTE — Therapy (Signed)
[facility] [address] [place], [place], [address] Phone: [phone]   Fax:  [phone]  Physical Therapy Treatment  Patient Details  Name: Bobby Lindsey MRN: [id number] Date of Birth: 11-09-1955 Referring Provider: Rhina Brackett, MD   Encounter Date: 10/17/2017  PT End of Session - 10/17/17 1537    Visit Number  5    Number of Visits  18    Authorization Type  [employer] Managed Pt has met VL. Wife reports approved for 10 additional visits for now. Wife & pt plan to apply for Conception.     Authorization - Visit Number  5    Authorization - Number of Visits  10    PT Start Time  2979    PT Stop Time  1615    PT Time Calculation (min)  42 min    Equipment Utilized During Treatment  Gait belt    Activity Tolerance  Patient tolerated treatment well;No increased pain    Behavior During Therapy  WFL for tasks assessed/performed       Past Medical History:  Diagnosis Date  . Anginal pain ([place])    last pm  . Atrial fibrillation ([place])    a. Transient during 03/2013 admission.  . Bradycardia    a. Bradycardia/pauses/possible Mobitz II during 03/2013 adm. Not on BB due to this.  Marland Kitchen CAD (coronary artery disease)    a. s/p CABG 2002. b. Hx Cypher stent to the RCA. c. Inf-lat STEMI 03/2013:  LHC (04/05/13):  mLAD occluded, pD1 90, apical br of Dx occluded, CFX occluded, pOM1 90-95, RCA stents patent, diff RCA 30, S-Dx occluded, S-PDA occluded, S-OM1 40-50, L-LAD patent, EF 40% with inf HK.  PCI:  Promus (2.5 x 28) DES to mid to dist CFX.  Marland Kitchen Charcot's joint of knee   . COPD (chronic obstructive pulmonary disease) ([place])   . Deep venous thrombosis ([facility])    right lower extremity  . Diabetes mellitus    a. A1C 10.7 in 03/2013.  . Diastolic CHF ([place])    a. EF 40% by cath, 55-60% during 03/2013 adm, required IV diuresis.  . DVT, lower extremity, recurrent ([place])    a. Hx recurrent DVT per record.  . Dyslipidemia    . Elevated CK    a. Pt has refused rheum workup in the past.  . GERD (gastroesophageal reflux disease)   . History of hiatal hernia   . HTN (hypertension)    x 15 years  . Hx of cardiovascular stress test    a. Lexiscan Myoview (03/2010):  diaph atten vs inf scar, no ischemia, EF 47%; Low Risk.  Marland Kitchen Hx of echocardiogram    a. Echo (04/08/13):  Mild LVH, EF 55-60%, restrictive physiology, severe LAE, mild reduced RVSF, mild RAE.  . Leg pain    ABI 6/16:  R 1.2, L 1.1 - normal  . Myocardial infarction ([place])   . Obesity   . Peripheral neuropathy   . Pulmonary embolism ([place])   . Sleep apnea     Past Surgical History:  Procedure Laterality Date  . CORONARY ANGIOPLASTY WITH STENT PLACEMENT    . CORONARY ARTERY BYPASS GRAFT     4 time since 2002  . LEFT HEART CATHETERIZATION WITH CORONARY/GRAFT ANGIOGRAM  04/05/2013   Procedure: LEFT HEART CATHETERIZATION WITH Beatrix Fetters;  Surgeon: Peter M Martinique, MD;  Location: [facility] CATH LAB;  Service: Cardiovascular;;  . left knee surgery    . LOWER  EXTREMITY ANGIOGRAPHY Left 12/25/2016   Procedure: LOWER EXTREMITY ANGIOGRAPHY;  Surgeon: Algernon Huxley, MD;  Location: [facility] CV LAB;  Service: Cardiovascular;  Laterality: Left;  . PERCUTANEOUS CORONARY STENT INTERVENTION (PCI-S)  04/05/2013   Procedure: PERCUTANEOUS CORONARY STENT INTERVENTION (PCI-S);  Surgeon: Peter M Martinique, MD;  Location: [facility] CATH LAB;  Service: Cardiovascular;;  DES to native Mid cx  . VASCULAR SURGERY      There were no vitals filed for this visit.  Subjective Assessment - 10/17/17 1537    Subjective  No new complaints. No falls. Still having pain at left knee/tibial crest area with walking.     Pertinent History  L TTA, DM2, perepheral neuropathy, PVD, CAD, CABG X3, CHF, DVTs, HTN, HLD, A-Fib,     Limitations  Standing;House hold activities;Walking    Patient Stated Goals  To use prosthesis to walk, hunt, fish, return to work [employer] cabin [employer] business)     Currently in Pain?  No/denies           [facility] Adult PT Treatment/Exercise - 10/17/17 1538      Transfers   Transfers  Sit to Stand;Stand to Sit    Sit to Stand  5: Supervision;With upper extremity assist;With armrests;From chair/3-in-1;From elevated surface    Sit to Stand Details  Verbal cues for sequencing;Verbal cues for precautions/safety    Stand to Sit  5: Supervision;With upper extremity assist;With armrests;To elevated surface;To chair/3-in-1    Stand to Sit Details (indicate cue type and reason)  Verbal cues for precautions/safety;Verbal cues for sequencing      Ambulation/Gait   Ambulation/Gait  Yes    Ambulation/Gait Assistance  4: Min guard;5: Supervision    Ambulation/Gait Assistance Details  cues needed for more upright posture, to keep hips closer to walker and for increase base of support.    Ambulation Distance (Feet)  120 Feet   x1, plus around gym with barriers   Assistive device  Prosthesis;Rolling walker    Gait Pattern  Step-through pattern;Decreased stride length;Decreased stance time - left;Decreased step length - right;Decreased weight shift to left;Antalgic;Narrow base of support;Trunk flexed    Ambulation Surface  Level;Indoor    Stairs  Yes    Stairs Assistance  5: Supervision;4: Min guard    Stairs Assistance Details (indicate cue type and reason)  reminder cues on sequencing and weight shifitng    Stair Management Technique  Two rails;Step to pattern;Forwards    Number of Stairs  4    Height of Stairs  6    Ramp  4: Min assist    Ramp Details (indicate cue type and reason)  cues on posture, sequencing and technique    Curb  4: Min assist;Other (comment)   min guard assist   Curb Details (indicate cue type and reason)  reminder cues needed on stance position for advancing walker and sequencing.                     Prosthetics   Prosthetic Care Comments   pt continues to report a "catching" in his left knee with gait, specifically with weight bearing  from intial contact into toe off which bends the knee. Reports this happens with every step. Advised him to call his ortho MD about his, primary PT agreed.     Current prosthetic wear tolerance (days/week)   daily    Current prosthetic wear tolerance (#hours/day)   4-6 hours 2 x day    Residual limb condition   intact  hard scab over wound, otherwise healed. no longer needs Tegaderm    Education Provided  Residual limb care;Proper Donning;Proper Doffing;Proper wear schedule/adjustment;Proper weight-bearing schedule/adjustment;Correct ply sock adjustment    Donning Prosthesis  Supervision    Doffing Prosthesis  Supervision           PT Short Term Goals - 10/02/17 1800      PT SHORT TERM GOAL #1   Title  Patient demonstrates proper donning of prosthesis & verbalizes proper cleaning. (All STGs Target Date: 11/02/2017)    Baseline       Time  1    Period  Months    Status  New    Target Date  11/02/17      PT SHORT TERM GOAL #2   Title  Patient tolerates wear of prosthesis >8hrs total per day without increase in skin issues & limb pain </= 5/10.    Baseline       Time  1    Status  New    Target Date  11/02/17      PT SHORT TERM GOAL #3   Title  Patient ambulates 150' with RW & prosthesis with supervision.     Baseline       Time  1    Period  Months    Status  New    Target Date  11/02/17      PT SHORT TERM GOAL #4   Title  Patient negotiates ramps, curbs with RW & stairs with 2 rails with prosthesis with minimal guard.     Time  1    Period  Months    Status  New    Target Date  11/02/17      PT SHORT TERM GOAL #5   Title  Patient performs standing balance with RW support & prosthesis reaching 10" anteriorly, picking up 1# object from floor & scanning environment with supervision, no balance losses.     Time  1    Period  Months    Status  New    Target Date  11/02/17        PT Long Term Goals - 10/02/17 1800      PT LONG TERM GOAL #1   Title  Patient verbalizes  & demonstrates proper prosthetic care to enable safe use of prosthesis. (All LTGs Target Date: 12/28/2017)    Baseline       Time  3    Period  Months    Status  New    Target Date  12/28/17      PT LONG TERM GOAL #2   Title  Patient tolerates wear of prosthesis >90% of awake hours without skin issues or limb pain >2/10 to enable function throughout his day.     Baseline        Time  3    Period  Months    Status  New    Target Date  12/28/17      PT LONG TERM GOAL #3   Title  Berg Balance >36/56 to indicate lower fall risk & less dependency in standing ADLs.     Baseline        Time  3    Period  Months    Status  New    Target Date  12/28/17      PT LONG TERM GOAL #4   Title  Patient ambulates 500' outdoors including paved & grass surfaces with LRAD & prosthesis modified independent to enable community  mobility.     Baseline        Time  3    Period  Months    Status  New    Target Date  12/28/17      PT LONG TERM GOAL #5   Title  Patient negotiates ramps, curbs & stairs with LRAD & prosthesis modified independent to enable community access.     Baseline        Time  3    Period  Months    Status  New    Target Date  12/28/17      PT LONG TERM GOAL #6   Title  Patient ambulates 100' around furniture carrying plate or cup with cane or less & prosthesis modified independent.     Baseline        Time  3    Period  Months    Status  New    Target Date  12/28/17            Plan - 10/17/17 1537    Clinical Impression Statement  Today's skilled session continued to address prosthetic education and mobility with prosthesis/walker. Pt continues to report increased pain at knee with flexion/swing phase that he reportes as a "catch". Advised pt to follow up with his primary MD or his ortho MD to have knee futher checked out. Primary PT agreed. The pt should benefit from continued PT to progress toward unmet goals.     Rehab Potential  Good    Clinical Impairments  Affecting Rehab Potential  weakness BUEs, BLEs & trunk, impaired balance with high fall risk, dependent gait with prosthesis, dependent in proper prosthetic care & use    PT Frequency  2x / week   2x/wk for 5 weeks, then 1x/wk for 8 weeks   PT Duration  Other (comment)   13 weeks (3 months)   PT Treatment/Interventions  ADLs/Self Care Home Management;Canalith Repostioning;DME Instruction;Gait training;Stair training;Functional mobility training;Therapeutic activities;Therapeutic exercise;Balance training;Patient/family education;Neuromuscular re-education;Prosthetic Training;Manual techniques;Vestibular    PT Next Visit Plan  check wounds,prosthetic gait with RW, stairs and other barriers, work on balance with decreased UE support    Consulted and Agree with Plan of Care  Patient;Family member/caregiver    Family Member Consulted  Amy Rembold, wife       Patient will benefit from skilled therapeutic intervention in order to improve the following deficits and impairments:  Abnormal gait, Decreased activity tolerance, Decreased balance, Decreased coordination, Decreased endurance, Decreased knowledge of use of DME, Decreased mobility, Decreased range of motion, Decreased strength, Dizziness, Impaired flexibility, Prosthetic Dependency, Postural dysfunction, Pain  Visit Diagnosis: Muscle weakness (generalized)  Other abnormalities of gait and mobility  Unsteadiness on feet  Abnormal posture     Problem List Patient Active Problem List   Diagnosis Date Noted  . PAD (peripheral artery disease) ([place]) 01/09/2017  . HTN (hypertension) 12/19/2016  . Atherosclerosis of native arteries of the extremities with ulceration ([place]) 12/19/2016  . CHF (congestive heart failure) ([place]) 11/24/2016  . Demand ischemia ([place])   . 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 NSTEMI (non-ST elevated myocardial infarction)  ([place]) 11/23/2016  . Acute diastolic CHF (congestive heart failure) ([place]) 11/23/2016  . CAD (coronary artery disease)   . Acute congestive heart failure ([place])   . DDD (degenerative disc disease), lumbar 03/27/2016  . History of recurrent deep vein thrombosis (DVT) 03/26/2016  . History of  pulmonary embolism 10/01/2015  . Nephropathy, diabetic ([place]) 10/01/2015  . 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 History of recurrent DVT/E 04/11/2013  . Obstructive sleep apnea 04/11/2013  . Bradycardia 04/11/2013  . History of Elevated CK level  04/11/2013  . Obesity 04/11/2013  . Peripheral neuropathy ([place])   . Charcot's arthropathy   . Chronic obstructive pulmonary disease ([place])   . Chronic diastolic heart failure ([place]) 04/08/2013  . Paroxysmal atrial fibrillation ([place]) 04/07/2013  . Reactive depression (situ[place]) 05/16/2011  . Long term (current) use of anticoagulants 09/21/2010  . 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 ED (erectile dysfunction) of organic origin 07/28/2010  . Poorly controlled type 2 diabetes mellitus with peripheral neuropathy ([place]) 05/26/2008  . Hyperlipidemia 05/26/2008  . Hypertensive heart disease 05/26/2008    Willow Ora, PTA, [facility] [address], [place], [place] [address] [phone] 10/18/17, 10:19 PM   Name: DAREON NUNZIATO MRN: [id number] Date of Birth: 1956-02-05

## 2017-10-23 ENCOUNTER — Ambulatory Visit: Payer: Managed Care, Other (non HMO) | Admitting: Physical Therapy

## 2017-10-23 ENCOUNTER — Encounter: Payer: Self-pay | Admitting: Physical Therapy

## 2017-10-23 DIAGNOSIS — R293 Abnormal posture: Secondary | ICD-10-CM

## 2017-10-23 DIAGNOSIS — M6281 Muscle weakness (generalized): Secondary | ICD-10-CM

## 2017-10-23 DIAGNOSIS — R2689 Other abnormalities of gait and mobility: Secondary | ICD-10-CM

## 2017-10-23 DIAGNOSIS — R2681 Unsteadiness on feet: Secondary | ICD-10-CM

## 2017-10-24 NOTE — Therapy (Signed)
[facility] [address] [place], [place], [address] Phone: [phone]   Fax:  [phone]  Physical Therapy Treatment  Patient Details  Name: Bobby Lindsey MRN: [id number] Date of Birth: 04/11/1955 Referring Provider: Rhina Brackett, MD   Encounter Date: 10/23/2017  PT End of Session - 10/23/17 1540    Visit Number  6    Number of Visits  18    Authorization Type  [employer] Managed Pt has met VL. Wife reports approved for 10 additional visits for now. Wife & pt plan to apply for [place].     Authorization - Visit Number  6    Authorization - Number of Visits  10    PT Start Time  2505    PT Stop Time  1530    PT Time Calculation (min)  45 min    Equipment Utilized During Treatment  Gait belt    Activity Tolerance  Patient tolerated treatment well;No increased pain    Behavior During Therapy  WFL for tasks assessed/performed       Past Medical History:  Diagnosis Date  . Anginal pain ([place])    last pm  . Atrial fibrillation ([place])    a. Transient during 03/2013 admission.  . Bradycardia    a. Bradycardia/pauses/possible Mobitz II during 03/2013 adm. Not on BB due to this.  Marland Kitchen CAD (coronary artery disease)    a. s/p CABG 2002. b. Hx Cypher stent to the RCA. c. Inf-lat STEMI 03/2013:  LHC (04/05/13):  mLAD occluded, pD1 90, apical br of Dx occluded, CFX occluded, pOM1 90-95, RCA stents patent, diff RCA 30, S-Dx occluded, S-PDA occluded, S-OM1 40-50, L-LAD patent, EF 40% with inf HK.  PCI:  Promus (2.5 x 28) DES to mid to dist CFX.  Marland Kitchen Charcot's joint of knee   . COPD (chronic obstructive pulmonary disease) ([place])   . Deep venous thrombosis (H[facility])    right lower extremity  . Diabetes mellitus    a. A1C 10.7 in 03/2013.  . Diastolic CHF ([place])    a. EF 40% by cath, 55-60% during 03/2013 adm, required IV diuresis.  . DVT, lower extremity, recurrent ([place])    a. Hx recurrent DVT per record.  . Dyslipidemia    . Elevated CK    a. Pt has refused rheum workup in the past.  . GERD (gastroesophageal reflux disease)   . History of hiatal hernia   . HTN (hypertension)    x 15 years  . Hx of cardiovascular stress test    a. Lexiscan Myoview (03/2010):  diaph atten vs inf scar, no ischemia, EF 47%; Low Risk.  Marland Kitchen Hx of echocardiogram    a. Echo (04/08/13):  Mild LVH, EF 55-60%, restrictive physiology, severe LAE, mild reduced RVSF, mild RAE.  . Leg pain    ABI 6/16:  R 1.2, L 1.1 - normal  . Myocardial infarction ([place])   . Obesity   . Peripheral neuropathy   . Pulmonary embolism ([place])   . Sleep apnea     Past Surgical History:  Procedure Laterality Date  . CORONARY ANGIOPLASTY WITH STENT PLACEMENT    . CORONARY ARTERY BYPASS GRAFT     4 time since 2002  . LEFT HEART CATHETERIZATION WITH CORONARY/GRAFT ANGIOGRAM  04/05/2013   Procedure: LEFT HEART CATHETERIZATION WITH Beatrix Fetters;  Surgeon: Peter M Martinique, MD;  Location: [facility] CATH LAB;  Service: Cardiovascular;;  . left knee surgery    . LOWER  EXTREMITY ANGIOGRAPHY Left 12/25/2016   Procedure: LOWER EXTREMITY ANGIOGRAPHY;  Surgeon: Algernon Huxley, MD;  Location: [facility] CV LAB;  Service: Cardiovascular;  Laterality: Left;  . PERCUTANEOUS CORONARY STENT INTERVENTION (PCI-S)  04/05/2013   Procedure: PERCUTANEOUS CORONARY STENT INTERVENTION (PCI-S);  Surgeon: Peter M Martinique, MD;  Location: [facility] CATH LAB;  Service: Cardiovascular;;  DES to native Mid cx  . VASCULAR SURGERY      There were no vitals filed for this visit.  Subjective Assessment - 10/23/17 1445    Subjective  He is wearing prosthesis from ~10 until ~5. He gets up at ~9 and goes to bed 11-12. He takes bath in morning after breakfast. No falls.     Pertinent History  L TTA, DM2, perepheral neuropathy, PVD, CAD, CABG X3, CHF, DVTs, HTN, HLD, A-Fib,     Limitations  Standing;House hold activities;Walking    Patient Stated Goals  To use prosthesis to walk, hunt, fish, return  to work [employer] business)    Currently in Pain?  No/denies                       [facility] Adult PT Treatment/Exercise - 10/23/17 1445      Transfers   Transfers  Sit to Stand;Stand to Sit    Sit to Stand  5: Supervision;With upper extremity assist;With armrests;From chair/3-in-1;From elevated surface   chairs with & without armrests   Sit to Stand Details  Verbal cues for sequencing;Verbal cues for precautions/safety    Stand to Sit  5: Supervision;With upper extremity assist;With armrests;To elevated surface;To chair/3-in-1   chairs with & without armrests   Stand to Sit Details (indicate cue type and reason)  Verbal cues for precautions/safety;Verbal cues for sequencing      Ambulation/Gait   Ambulation/Gait  Yes    Ambulation/Gait Assistance  4: Min guard;5: Supervision    Ambulation/Gait Assistance Details  demo, verbal & tactile cues on step length, step through, fluent RW movement & step width    Ambulation Distance (Feet)  125 Feet   125' X 2   Assistive device  Prosthesis;Rolling walker    Gait Pattern  Step-through pattern;Decreased stride length;Decreased stance time - left;Decreased step length - right;Decreased weight shift to left;Antalgic;Narrow base of support;Trunk flexed    Ambulation Surface  Indoor;Level    Stairs  Yes    Stairs Assistance  5: Supervision    Stairs Assistance Details (indicate cue type and reason)  verbal cues on technique & sequence    Stair Management Technique  Two rails;Step to pattern;Forwards    Number of Stairs  4    Height of Stairs  6    Ramp  4: Min assist   RW & prosthesis   Ramp Details (indicate cue type and reason)  verbal & demo cues on technique    Curb  4: Min assist   RW & prosthesis   Curb Details (indicate cue type and reason)  verbal & demo cues on technique      Self-Care   Self-Care  Lifting    Lifting  PT demo, instructed in picking up objects from floor. Pt return demo understanding with RW  support & supervision.       Prosthetics   Prosthetic Care Comments   pt continues to report a "catching" in his left knee with gait, specifically with weight bearing from intial contact into toe off which bends the knee. Reports this happens with every step. Advised him  to call his ortho MD about his, primary PT agreed.     Current prosthetic wear tolerance (days/week)   daily    Current prosthetic wear tolerance (#hours/day)   4-6 hours 2 x day    Residual limb condition   intact hard scab over wound, otherwise healed. no longer needs Tegaderm    Education Provided  Residual limb care;Proper Donning;Proper Doffing;Proper wear schedule/adjustment;Proper weight-bearing schedule/adjustment;Correct ply sock adjustment    Person(s) Educated  Patient;Spouse    Education Method  Explanation;Demonstration;Verbal cues;Tactile cues    Education Method  Verbalized understanding;Returned demonstration;Tactile cues required;Verbal cues required;Needs further instruction    Donning Prosthesis  Supervision    Doffing Prosthesis  Supervision               PT Short Term Goals - 10/23/17 1541      PT SHORT TERM GOAL #1   Title  Patient demonstrates proper donning of prosthesis & verbalizes proper cleaning. (All STGs Target Date: 11/02/2017)    Baseline       Time  1    Period  Months    Status  On-going    Target Date  11/02/17      PT SHORT TERM GOAL #2   Title  Patient tolerates wear of prosthesis >8hrs total per day without increase in skin issues & limb pain </= 5/10.    Baseline       Time  1    Status  On-going    Target Date  11/02/17      PT SHORT TERM GOAL #3   Title  Patient ambulates 150' with RW & prosthesis with supervision.     Baseline       Time  1    Period  Months    Status  On-going    Target Date  11/02/17      PT SHORT TERM GOAL #4   Title  Patient negotiates ramps, curbs with RW & stairs with 2 rails with prosthesis with minimal guard.     Time  1    Period   Months    Status  On-going    Target Date  11/02/17      PT SHORT TERM GOAL #5   Title  Patient performs standing balance with RW support & prosthesis reaching 10" anteriorly, picking up 1# object from floor & scanning environment with supervision, no balance losses.     Time  1    Period  Months    Status  On-going    Target Date  11/02/17        PT Long Term Goals - 10/23/17 1542      PT LONG TERM GOAL #1   Title  Patient verbalizes & demonstrates proper prosthetic care to enable safe use of prosthesis. (All LTGs Target Date: 12/28/2017)    Baseline       Time  3    Period  Months    Status  On-going    Target Date  12/28/17      PT LONG TERM GOAL #2   Title  Patient tolerates wear of prosthesis >90% of awake hours without skin issues or limb pain >2/10 to enable function throughout his day.     Baseline        Time  3    Period  Months    Status  On-going    Target Date  12/28/17      PT LONG TERM GOAL #3   Title  Berg Balance >36/56 to indicate lower fall risk & less dependency in standing ADLs.     Baseline        Time  3    Period  Months    Status  On-going    Target Date  12/28/17      PT LONG TERM GOAL #4   Title  Patient ambulates 500' outdoors including paved & grass surfaces with LRAD & prosthesis modified independent to enable community mobility.     Baseline        Time  3    Period  Months    Status  On-going    Target Date  12/28/17      PT LONG TERM GOAL #5   Title  Patient negotiates ramps, curbs & stairs with LRAD & prosthesis modified independent to enable community access.     Baseline        Time  3    Period  Months    Status  On-going    Target Date  12/28/17      PT LONG TERM GOAL #6   Title  Patient ambulates 100' around furniture carrying plate or cup with cane or less & prosthesis modified independent.     Baseline        Time  3    Period  Months    Status  On-going    Target Date  12/28/17            Plan - 10/23/17  1814    Clinical Impression Statement  Patient improved prosthetic gait with skilled instructions in step length with fluent RW movement and step width.     Rehab Potential  Good    Clinical Impairments Affecting Rehab Potential  weakness BUEs, BLEs & trunk, impaired balance with high fall risk, dependent gait with prosthesis, dependent in proper prosthetic care & use    PT Frequency  2x / week   2x/wk for 5 weeks, then 1x/wk for 8 weeks   PT Duration  Other (comment)   13 weeks (3 months)   PT Treatment/Interventions  ADLs/Self Care Home Management;Canalith Repostioning;DME Instruction;Gait training;Stair training;Functional mobility training;Therapeutic activities;Therapeutic exercise;Balance training;Patient/family education;Neuromuscular re-education;Prosthetic Training;Manual techniques;Vestibular    PT Next Visit Plan  check wounds, prosthetic gait with RW, stairs and other barriers, work on balance with decreased UE support    Consulted and Agree with Plan of Care  Patient;Family member/caregiver    Family Member Consulted  Amy Degeorge, wife       Patient will benefit from skilled therapeutic intervention in order to improve the following deficits and impairments:  Abnormal gait, Decreased activity tolerance, Decreased balance, Decreased coordination, Decreased endurance, Decreased knowledge of use of DME, Decreased mobility, Decreased range of motion, Decreased strength, Dizziness, Impaired flexibility, Prosthetic Dependency, Postural dysfunction, Pain  Visit Diagnosis: Muscle weakness (generalized)  Other abnormalities of gait and mobility  Unsteadiness on feet  Abnormal posture     Problem List Patient Active Problem List   Diagnosis Date Noted  . PAD (peripheral artery disease) ([place]) 01/09/2017  . HTN (hypertension) 12/19/2016  . Atherosclerosis of native arteries of the extremities with ulceration ([place]) 12/19/2016  . CHF (congestive heart failure) ([place]) 11/24/2016  .  Demand ischemia ([place])   . 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 NSTEMI (non-ST elevated myocardial infarction) ([place]) 11/23/2016  . Acute diastolic CHF (congestive heart failure) ([place]) 11/23/2016  .  CAD (coronary artery disease)   . Acute congestive heart failure ([place])   . DDD (degenerative disc disease), lumbar 03/27/2016  . History of recurrent deep vein thrombosis (DVT) 03/26/2016  . History of pulmonary embolism 10/01/2015  . Nephropathy, diabetic ([place]) 10/01/2015  . 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 History of recurrent DVT/E 04/11/2013  . Obstructive sleep apnea 04/11/2013  . Bradycardia 04/11/2013  . History of Elevated CK level  04/11/2013  . Obesity 04/11/2013  . Peripheral neuropathy ([place])   . Charcot's arthropathy   . Chronic obstructive pulmonary disease ([place])   . Chronic diastolic heart failure ([place]) 04/08/2013  . Paroxysmal atrial fibrillation ([place]) 04/07/2013  . Reactive depression (situ[place]) 05/16/2011  . Long term (current) use of anticoagulants 09/21/2010  . 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 ED (erectile dysfunction) of organic origin 07/28/2010  . Poorly controlled type 2 diabetes mellitus with peripheral neuropathy ([place]) 05/26/2008  . Hyperlipidemia 05/26/2008  . Hypertensive heart disease 05/26/2008    Jamey Reas PT, DPT 10/24/2017, 6:15 PM  [facility] [address] [place], [place], [address] Phone: [phone]   Fax:  [phone]  Name: Bobby Lindsey MRN: [id number] Date of Birth: 1955/09/07

## 2017-10-30 ENCOUNTER — Ambulatory Visit: Payer: Managed Care, Other (non HMO) | Admitting: Physical Therapy

## 2017-10-30 ENCOUNTER — Encounter: Payer: Self-pay | Admitting: Physical Therapy

## 2017-10-30 DIAGNOSIS — R296 Repeated falls: Secondary | ICD-10-CM

## 2017-10-30 DIAGNOSIS — R293 Abnormal posture: Secondary | ICD-10-CM

## 2017-10-30 DIAGNOSIS — M6281 Muscle weakness (generalized): Secondary | ICD-10-CM

## 2017-10-30 DIAGNOSIS — R2681 Unsteadiness on feet: Secondary | ICD-10-CM

## 2017-10-30 DIAGNOSIS — R2689 Other abnormalities of gait and mobility: Secondary | ICD-10-CM

## 2017-11-01 NOTE — Therapy (Signed)
[facility] [address] [place], [place], [address] Phone: [phone]   Fax:  [phone]  Physical Therapy Treatment  Patient Details  Name: Bobby Lindsey MRN: [id number] Date of Birth: [birth year] Referring Provider: Rhina Brackett, MD   Encounter Date: 10/30/2017   10/30/17 1110  PT Visits / Re-Eval  Visit Number 7  Number of Visits Hana Pt has met VL. Wife reports approved for 10 additional visits for now. Wife & pt plan to apply for [place].   Authorization - Visit Number 7  Authorization - Number of Visits 10  PT Time Calculation  PT Start Time 1104  PT Stop Time 1145  PT Time Calculation (min) 41 min  PT - End of Session  Equipment Utilized During Treatment Gait belt  Activity Tolerance Patient tolerated treatment well  Behavior During Therapy WFL for tasks assessed/performed     Past Medical History:  Diagnosis Date  . Anginal pain ([place])    last pm  . Atrial fibrillation ([place])    a. Transient during 03/2013 admission.  . Bradycardia    a. Bradycardia/pauses/possible Mobitz II during 03/2013 adm. Not on BB due to this.  Marland Kitchen CAD (coronary artery disease)    a. s/p CABG 2002. b. Hx Cypher stent to the RCA. c. Inf-lat STEMI 03/2013:  LHC (04/05/13):  mLAD occluded, pD1 90, apical br of Dx occluded, CFX occluded, pOM1 90-95, RCA stents patent, diff RCA 30, S-Dx occluded, S-PDA occluded, S-OM1 40-50, L-LAD patent, EF 40% with inf HK.  PCI:  Promus (2.5 x 28) DES to mid to dist CFX.  Marland Kitchen Charcot's joint of knee   . COPD (chronic obstructive pulmonary disease) ([place])   . Deep venous thrombosis ([facility])    right lower extremity  . Diabetes mellitus    a. A1C 10.7 in 03/2013.  . Diastolic CHF ([place])    a. EF 40% by cath, 55-60% during 03/2013 adm, required IV diuresis.  . DVT, lower extremity, recurrent ([place])    a. Hx recurrent DVT per record.  .  Dyslipidemia   . Elevated CK    a. Pt has refused rheum workup in the past.  . GERD (gastroesophageal reflux disease)   . History of hiatal hernia   . HTN (hypertension)    x 15 years  . Hx of cardiovascular stress test    a. Lexiscan Myoview (03/2010):  diaph atten vs inf scar, no ischemia, EF 47%; Low Risk.  Marland Kitchen Hx of echocardiogram    a. Echo (04/08/13):  Mild LVH, EF 55-60%, restrictive physiology, severe LAE, mild reduced RVSF, mild RAE.  . Leg pain    ABI 6/16:  R 1.2, L 1.1 - normal  . Myocardial infarction ([place])   . Obesity   . Peripheral neuropathy   . Pulmonary embolism ([place])   . Sleep apnea     Past Surgical History:  Procedure Laterality Date  . CORONARY ANGIOPLASTY WITH STENT PLACEMENT    . CORONARY ARTERY BYPASS GRAFT     4 time since 2002  . LEFT HEART CATHETERIZATION WITH CORONARY/GRAFT ANGIOGRAM  04/05/2013   Procedure: LEFT HEART CATHETERIZATION WITH Beatrix Fetters;  Surgeon: Peter M Martinique, MD;  Location: [facility] CATH LAB;  Service: Cardiovascular;;  . left knee surgery    . LOWER EXTREMITY ANGIOGRAPHY Left 12/25/2016   Procedure: LOWER EXTREMITY ANGIOGRAPHY;  Surgeon: Algernon Huxley, MD;  Location: [facility] CV LAB;  Service:  Cardiovascular;  Laterality: Left;  . PERCUTANEOUS CORONARY STENT INTERVENTION (PCI-S)  04/05/2013   Procedure: PERCUTANEOUS CORONARY STENT INTERVENTION (PCI-S);  Surgeon: Peter M Martinique, MD;  Location: [facility] CATH LAB;  Service: Cardiovascular;;  DES to native Mid cx  . VASCULAR SURGERY      There were no vitals filed for this visit.     10/30/17 1107  Symptoms/Limitations  Subjective No new complaints. Still with the knee catching/pain, only with walking.Marland Kitchen Has a appt this week (he believes) to see MD about it. Saw Ronalee Belts, has pre-tibial pads in socket. No falls.   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 No/denies      10/30/17 1115  Transfers  Transfers Sit to Stand;Stand to Sit  Sit to Stand 5: Supervision;With upper extremity assist;With armrests;From chair/3-in-1;From elevated surface  Sit to Stand Details (indicate cue type and reason) needs RW to stabilize on with standing  Stand to Sit 5: Supervision;With upper extremity assist;With armrests;To elevated surface;To chair/3-in-1  Stand to Sit Details uses RW to stabilize with sitting down  Ambulation/Gait  Ambulation/Gait Yes  Ambulation/Gait Assistance 5: Supervision  Ambulation/Gait Assistance Details cues on posture, step length and weight shifting with gait. At end of session initiated gait with straight cane around table for 3 laps with min guard to min assist for balance. Cues on posture and sequencing needed. The pt was able to hover his hand over table for some of gait.   Ambulation Distance (Feet) 125 Feet (x2, 165 x1, plus around gym with activity)  Assistive device Prosthesis;Rolling walker;Straight cane  Gait Pattern Step-through pattern;Decreased stride length;Decreased stance time - left;Decreased step length - right;Decreased weight shift to left;Antalgic;Narrow base of support;Trunk flexed  Ambulation Surface Level;Indoor  Stairs Assistance 5: Supervision  Stairs Assistance Details (indicate cue type and reason) reminder cues on posture, step length and weight shifitng  Stair Management Technique Two rails;Step to pattern;Forwards  Number of Stairs 4  Height of Stairs 6  Ramp Other (comment) (min guard assist)  Ramp Details (indicate cue type and reason) cues on posture, step length and walker position   Curb Other (comment) (min guard assist)  Curb Details (indicate cue type and reason) reminder cues for stance position for balance with walker advanment.   Therapeutic Activites   Therapeutic  Activities Other Therapeutic Activities  Other Therapeutic Activities with RW support: pt is able to reach forward for 10 inches, reach down to floor to retrieve 1# item and turn to look over shoulder each side with single UE support.   Prosthetics  Current prosthetic wear tolerance (days/week)  daily  Current prosthetic wear tolerance (#hours/day)  4-6 hours 2 x day  Residual limb condition  scab over incision remains intact  Education Provided Proper wear schedule/adjustment;Other (comment);Correct ply sock adjustment (cut off socks)  Person(s) Educated Patient  Education Method Explanation;Demonstration;Verbal cues  Education Method Verbalized understanding;Returned demonstration;Verbal cues required;Needs further instruction  Donning Prosthesis 5  Doffing Prosthesis 5       PT Short Term Goals - 10/30/17 1118      PT SHORT TERM GOAL #1   Title  Patient demonstrates proper donning of prosthesis & verbalizes proper cleaning. (All STGs Target Date: 11/02/2017)    Baseline       Status  Achieved      PT SHORT TERM GOAL #2   Title  Patient tolerates wear of prosthesis >8hrs total per day without increase in skin issues & limb pain </= 5/10.    Baseline       Time  1    Status  Partially Met      PT SHORT TERM GOAL #3   Title  Patient ambulates 150' with RW & prosthesis with supervision.     Baseline       Time  1    Period  Months    Status  On-going      PT SHORT TERM GOAL #4   Title  Patient negotiates ramps, curbs with RW & stairs with 2 rails with prosthesis with minimal guard.     Time  1    Period  Months    Status  Achieved      PT SHORT TERM GOAL #5   Title  Patient performs standing balance with RW support & prosthesis reaching 10" anteriorly, picking up 1# object from floor & scanning environment with supervision, no balance losses.     Time  --    Period  --    Status  Achieved        PT Long Term Goals - 10/23/17 1542      PT LONG TERM GOAL #1   Title   Patient verbalizes & demonstrates proper prosthetic care to enable safe use of prosthesis. (All LTGs Target Date: 12/28/2017)    Baseline       Time  3    Period  Months    Status  On-going    Target Date  12/28/17      PT LONG TERM GOAL #2   Title  Patient tolerates wear of prosthesis >90% of awake hours without skin issues or limb pain >2/10 to enable function throughout his day.     Baseline        Time  3    Period  Months    Status  On-going    Target Date  12/28/17      PT LONG TERM GOAL #3   Title  Berg Balance >36/56 to indicate lower fall risk & less dependency in standing ADLs.     Baseline        Time  3    Period  Months    Status  On-going    Target Date  12/28/17      PT LONG TERM GOAL #4   Title  Patient ambulates 500' outdoors including paved & grass surfaces with LRAD & prosthesis modified independent to enable community mobility.     Baseline        Time  3    Period  Months    Status  On-going    Target Date  12/28/17      PT LONG TERM GOAL #5   Title  Patient negotiates ramps, curbs & stairs with LRAD & prosthesis modified independent to enable community access.     Baseline        Time  3    Period  Months    Status  On-going    Target Date  12/28/17      PT LONG TERM GOAL #6   Title  Patient ambulates 100' around furniture carrying plate or cup with cane or less & prosthesis modified independent.     Baseline        Time  3  Period  Months    Status  On-going    Target Date  12/28/17         10/30/17 1110  Plan  Clinical Impression Statement Today's skilled session focused on progress toward STGs with all goals met. With remainder of session began to address gait with straight cane around table. He is to start practicing this at home as he wants to be able to use a cane in son's wedding coming up soon. The pt is progressing well toward goals and should benefit from continued PT to progress toward unmet goals.   Pt will benefit from skilled  therapeutic intervention in order to improve on the following deficits Abnormal gait;Decreased activity tolerance;Decreased balance;Decreased coordination;Decreased endurance;Decreased knowledge of use of DME;Decreased mobility;Decreased range of motion;Decreased strength;Dizziness;Impaired flexibility;Prosthetic Dependency;Postural dysfunction;Pain  Rehab Potential Good  Clinical Impairments Affecting Rehab Potential weakness BUEs, BLEs & trunk, impaired balance with high fall risk, dependent gait with prosthesis, dependent in proper prosthetic care & use  PT Frequency 2x / week (2x/wk for 5 weeks, then 1x/wk for 8 weeks)  PT Duration Other (comment) (13 weeks (3 months))  PT Treatment/Interventions ADLs/Self Care Home Management;Canalith Repostioning;DME Instruction;Gait training;Stair training;Functional mobility training;Therapeutic activities;Therapeutic exercise;Balance training;Patient/family education;Neuromuscular re-education;Prosthetic Training;Manual techniques;Vestibular  PT Next Visit Plan monitor skin/scab of limb, continue with gait around table/at counter with cane, work on balance with decreased UE support  Consulted and Agree with Plan of Care Patient;Family member/caregiver  Family Member Consulted Amy Busk, wife       Patient will benefit from skilled therapeutic intervention in order to improve the following deficits and impairments:  Abnormal gait, Decreased activity tolerance, Decreased balance, Decreased coordination, Decreased endurance, Decreased knowledge of use of DME, Decreased mobility, Decreased range of motion, Decreased strength, Dizziness, Impaired flexibility, Prosthetic Dependency, Postural dysfunction, Pain  Visit Diagnosis: Muscle weakness (generalized)  Other abnormalities of gait and mobility  Unsteadiness on feet  Abnormal posture  Repeated falls     Problem List Patient Active Problem List   Diagnosis Date Noted  . PAD (peripheral artery  disease) ([place]) 01/09/2017  . HTN (hypertension) 12/19/2016  . Atherosclerosis of native arteries of the extremities with ulceration ([place]) 12/19/2016  . CHF (congestive heart failure) ([place]) 11/24/2016  . Demand ischemia ([place])   . 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 NSTEMI (non-ST elevated myocardial infarction) ([place]) 11/23/2016  . Acute diastolic CHF (congestive heart failure) ([place]) 11/23/2016  . CAD (coronary artery disease)   . Acute congestive heart failure ([place])   . DDD (degenerative disc disease), lumbar 03/27/2016  . History of recurrent deep vein thrombosis (DVT) 03/26/2016  . History of pulmonary embolism 10/01/2015  . Nephropathy, diabetic ([place]) 10/01/2015  . 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 History of recurrent DVT/E 04/11/2013  . Obstructive sleep apnea 04/11/2013  . Bradycardia 04/11/2013  . History of Elevated CK level  04/11/2013  . Obesity 04/11/2013  . Peripheral neuropathy ([place])   . Charcot's arthropathy   . Chronic obstructive pulmonary disease ([place])   . Chronic diastolic heart failure ([place]) 04/08/2013  . Paroxysmal atrial fibrillation ([place]) 04/07/2013  . Reactive depression (situ[place]) 05/16/2011  . Long term (current) use of anticoagulants 09/21/2010  . 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 ED (erectile dysfunction) of organic origin 07/28/2010  . Poorly controlled type 2 diabetes mellitus with peripheral neuropathy ([place]) 05/26/2008  . Hyperlipidemia 05/26/2008  . Hypertensive heart disease 05/26/2008  Willow Ora, PTA, [facility] [address], [place], [place] [address] [phone] 11/01/17, 12:05 PM   Name: Bobby Lindsey MRN: [id number] Date of Birth: [birth year]

## 2017-11-02 DIAGNOSIS — M1712 Unilateral primary osteoarthritis, left knee: Secondary | ICD-10-CM | POA: Insufficient documentation

## 2017-11-05 ENCOUNTER — Telehealth: Payer: Self-pay

## 2017-11-05 NOTE — Telephone Encounter (Signed)
Pt overdue for INR check (missed appt 9/11). Attempted to contact to see if he could come in this afternoon, but the phone just rang, no answer, no machine.

## 2017-11-07 ENCOUNTER — Ambulatory Visit (INDEPENDENT_AMBULATORY_CARE_PROVIDER_SITE_OTHER): Payer: Managed Care, Other (non HMO)

## 2017-11-07 DIAGNOSIS — Z86718 Personal history of other venous thrombosis and embolism: Secondary | ICD-10-CM | POA: Diagnosis not present

## 2017-11-07 DIAGNOSIS — Z7901 Long term (current) use of anticoagulants: Secondary | ICD-10-CM

## 2017-11-07 DIAGNOSIS — I2119 ST elevation (STEMI) myocardial infarction involving other coronary artery of inferior wall: Secondary | ICD-10-CM | POA: Diagnosis not present

## 2017-11-07 DIAGNOSIS — I480 Paroxysmal atrial fibrillation: Secondary | ICD-10-CM

## 2017-11-07 DIAGNOSIS — Z5181 Encounter for therapeutic drug level monitoring: Secondary | ICD-10-CM

## 2017-11-07 DIAGNOSIS — Z86711 Personal history of pulmonary embolism: Secondary | ICD-10-CM

## 2017-11-07 LAB — POCT INR: INR: 1.8 — AB (ref 2.0–3.0)

## 2017-11-07 NOTE — Patient Instructions (Signed)
Please take 2.5 tablets today and tomorrow, then continue dosage of 2 tablets daily except 1 TABLET ON MONDAYS & FRIDAYS. Recheck in 4 weeks.

## 2017-11-14 ENCOUNTER — Ambulatory Visit: Payer: Managed Care, Other (non HMO) | Admitting: Physical Therapy

## 2017-11-20 ENCOUNTER — Ambulatory Visit: Payer: Managed Care, Other (non HMO) | Attending: Physical Medicine & Rehabilitation | Admitting: Physical Therapy

## 2017-11-20 ENCOUNTER — Encounter: Payer: Self-pay | Admitting: Physical Therapy

## 2017-11-20 DIAGNOSIS — R2681 Unsteadiness on feet: Secondary | ICD-10-CM | POA: Diagnosis present

## 2017-11-20 DIAGNOSIS — R293 Abnormal posture: Secondary | ICD-10-CM | POA: Insufficient documentation

## 2017-11-20 DIAGNOSIS — R296 Repeated falls: Secondary | ICD-10-CM | POA: Insufficient documentation

## 2017-11-20 DIAGNOSIS — R2689 Other abnormalities of gait and mobility: Secondary | ICD-10-CM | POA: Diagnosis present

## 2017-11-20 DIAGNOSIS — M6281 Muscle weakness (generalized): Secondary | ICD-10-CM | POA: Diagnosis present

## 2017-11-20 NOTE — Therapy (Signed)
[facility] [address] [place], [place], [address] Phone: [phone]   Fax:  [phone]  Physical Therapy Treatment  Patient Details  Name: Bobby Lindsey MRN: [id number] Date of Birth: 1955/05/14 Referring Provider (PT): Rhina Brackett, MD   Encounter Date: 11/20/2017  PT End of Session - 11/20/17 1159    Visit Number  8    Number of Visits  18    Authorization Type  [employer] Managed Pt has met VL. Wife reports approved for 10 additional visits for now. Wife & pt plan to apply for [facility].     Authorization - Visit Number  7    Authorization - Number of Visits  10    PT Start Time  0109    PT Stop Time  1148    PT Time Calculation (min)  40 min    Equipment Utilized During Treatment  Gait belt    Activity Tolerance  Patient tolerated treatment well    Behavior During Therapy  WFL for tasks assessed/performed       Past Medical History:  Diagnosis Date  . Anginal pain ([place])    last pm  . Atrial fibrillation ([place])    a. Transient during 03/2013 admission.  . Bradycardia    a. Bradycardia/pauses/possible Mobitz II during 03/2013 adm. Not on BB due to this.  Marland Kitchen CAD (coronary artery disease)    a. s/p CABG 2002. b. Hx Cypher stent to the RCA. c. Inf-lat STEMI 03/2013:  LHC (04/05/13):  mLAD occluded, pD1 90, apical br of Dx occluded, CFX occluded, pOM1 90-95, RCA stents patent, diff RCA 30, S-Dx occluded, S-PDA occluded, S-OM1 40-50, L-LAD patent, EF 40% with inf HK.  PCI:  Promus (2.5 x 28) DES to mid to dist CFX.  Marland Kitchen Charcot's joint of knee   . COPD (chronic obstructive pulmonary disease) ([place])   . Deep venous thrombosis (H[facility])    right lower extremity  . Diabetes mellitus    a. A1C 10.7 in 03/2013.  . Diastolic CHF ([place])    a. EF 40% by cath, 55-60% during 03/2013 adm, required IV diuresis.  . DVT, lower extremity, recurrent ([place])    a. Hx recurrent DVT per record.  . Dyslipidemia   .  Elevated CK    a. Pt has refused rheum workup in the past.  . GERD (gastroesophageal reflux disease)   . History of hiatal hernia   . HTN (hypertension)    x 15 years  . Hx of cardiovascular stress test    a. Lexiscan Myoview (03/2010):  diaph atten vs inf scar, no ischemia, EF 47%; Low Risk.  Marland Kitchen Hx of echocardiogram    a. Echo (04/08/13):  Mild LVH, EF 55-60%, restrictive physiology, severe LAE, mild reduced RVSF, mild RAE.  . Leg pain    ABI 6/16:  R 1.2, L 1.1 - normal  . Myocardial infarction ([place])   . Obesity   . Peripheral neuropathy   . Pulmonary embolism ([place])   . Sleep apnea     Past Surgical History:  Procedure Laterality Date  . CORONARY ANGIOPLASTY WITH STENT PLACEMENT    . CORONARY ARTERY BYPASS GRAFT     4 time since 2002  . LEFT HEART CATHETERIZATION WITH CORONARY/GRAFT ANGIOGRAM  04/05/2013   Procedure: LEFT HEART CATHETERIZATION WITH Beatrix Fetters;  Surgeon: Peter M Martinique, MD;  Location: [facility] CATH LAB;  Service: Cardiovascular;;  . left knee surgery    . LOWER EXTREMITY  ANGIOGRAPHY Left 12/25/2016   Procedure: LOWER EXTREMITY ANGIOGRAPHY;  Surgeon: Algernon Huxley, MD;  Location: [facility] CV LAB;  Service: Cardiovascular;  Laterality: Left;  . PERCUTANEOUS CORONARY STENT INTERVENTION (PCI-S)  04/05/2013   Procedure: PERCUTANEOUS CORONARY STENT INTERVENTION (PCI-S);  Surgeon: Peter M Martinique, MD;  Location: [facility] CATH LAB;  Service: Cardiovascular;;  DES to native Mid cx  . VASCULAR SURGERY      There were no vitals filed for this visit.  Subjective Assessment - 11/20/17 1105    Subjective  He is wearing prosthesis all awake hours. Knee feels better. His right great toe is healed & better. No falls. He is still off balance so does not walk without walker.     Pertinent History  L TTA, DM2, perepheral neuropathy, PVD, CAD, CABG X3, CHF, DVTs, HTN, HLD, A-Fib,     Limitations  Standing;House hold activities;Walking    Patient Stated Goals  To use prosthesis to  walk, hunt, fish, return to work [employer] business)    Currently in Pain?  No/denies      Prosthetic Training with Transtibial prosthesis PT removed loss scab area from incision but did not debride any attached. No bleeding or drainage. Pt continues to use Tegaderm on lateral compartment but blister/wound healed.  Pt donnes prosthesis with verbal cues how to align pin.  PT cued pt on upright posture when ambulating with RW.  Pt ambulated 30' with LUE support on counter & RUE cane quad tip with verbal cues on posture & step length with supervision.   Therapeutic Exercise: Standing balance with corner posteriorly & RW anteriorly for safety & intermittent support: turning head right /left & up/down eyes open and static 10seconds eyes closed.  Back exercises seated in chair: knee to chest with foot on stool, trunk rotation, hamstring & gastroc stretches and standing back extension with posterior pelvis against counter. 3-5 deep breath holds 2-3 reps ea.                        PT Education - 11/20/17 1157    Education provided  Yes    Education Details  HEP standing balance & seated back stretches and one standing back extension at counter;     Person(s) Educated  Patient    Methods  Explanation;Demonstration;Tactile cues;Verbal cues;Handout    Comprehension  Verbalized understanding;Returned demonstration;Verbal cues required;Tactile cues required;Need further instruction       PT Short Term Goals - 11/20/17 2226      PT SHORT TERM GOAL #1   Title  Patient verbalizes proper adjustment of ply socks with limb volume changes. (All updated STGs Target Date: 12/07/2017)    Status  New    Target Date  12/07/17      PT SHORT TERM GOAL #2   Title  Patient reports daily wear of prosthesis >12 hrs without increase in skin issues.     Status  New    Target Date  12/07/17      PT SHORT TERM GOAL #3   Title  Patient ambulates 100' with cane & prosthesis with minimal  assist.     Status  New    Target Date  12/07/17      PT SHORT TERM GOAL #4   Title  Patient negotiates ramps, curbs with cane & stairs with 1 rail/cane with prosthesis with minimal assist.     Status  New    Target Date  12/07/17  PT SHORT TERM GOAL #5   Title  Patient performs standing balance with cane support & prosthesis reaching 10" anteriorly, picking up 1# object from floor & scanning environment with supervision, no balance losses.     Status  New    Target Date  12/07/17        PT Long Term Goals - 10/23/17 1542      PT LONG TERM GOAL #1   Title  Patient verbalizes & demonstrates proper prosthetic care to enable safe use of prosthesis. (All LTGs Target Date: 12/28/2017)    Baseline       Time  3    Period  Months    Status  On-going    Target Date  12/28/17      PT LONG TERM GOAL #2   Title  Patient tolerates wear of prosthesis >90% of awake hours without skin issues or limb pain >2/10 to enable function throughout his day.     Baseline        Time  3    Period  Months    Status  On-going    Target Date  12/28/17      PT LONG TERM GOAL #3   Title  Berg Balance >36/56 to indicate lower fall risk & less dependency in standing ADLs.     Baseline        Time  3    Period  Months    Status  On-going    Target Date  12/28/17      PT LONG TERM GOAL #4   Title  Patient ambulates 500' outdoors including paved & grass surfaces with LRAD & prosthesis modified independent to enable community mobility.     Baseline        Time  3    Period  Months    Status  On-going    Target Date  12/28/17      PT LONG TERM GOAL #5   Title  Patient negotiates ramps, curbs & stairs with LRAD & prosthesis modified independent to enable community access.     Baseline        Time  3    Period  Months    Status  On-going    Target Date  12/28/17      PT LONG TERM GOAL #6   Title  Patient ambulates 100' around furniture carrying plate or cup with cane or less & prosthesis  modified independent.     Baseline        Time  3    Period  Months    Status  On-going    Target Date  12/28/17            Plan - 11/20/17 1227    Clinical Impression Statement  Patient seems to have an understanding of updated HEP to work on balance in corner for safety. He reports stretches for low back seem to be helpful also. Patient is tolerating increased prosthesis wear and scab that has been present since PT initial evalution appears smaller.     Rehab Potential  Good    Clinical Impairments Affecting Rehab Potential  weakness BUEs, BLEs & trunk, impaired balance with high fall risk, dependent gait with prosthesis, dependent in proper prosthetic care & use    PT Frequency  1x / week   2x/wk for 5 weeks, then 1x/wk for 8 weeks   PT Duration  Other (comment)   13 weeks (3 months)   PT Treatment/Interventions  ADLs/Self Care Home Management;Canalith Repostioning;DME Instruction;Gait training;Stair training;Functional mobility training;Therapeutic activities;Therapeutic exercise;Balance training;Patient/family education;Neuromuscular re-education;Prosthetic Training;Manual techniques;Vestibular    PT Next Visit Plan  monitor skin/scab of limb, continue with gait around table/at counter with cane, work on balance with decreased UE support    Consulted and Agree with Plan of Care  Patient       Patient will benefit from skilled therapeutic intervention in order to improve the following deficits and impairments:  Abnormal gait, Decreased activity tolerance, Decreased balance, Decreased coordination, Decreased endurance, Decreased knowledge of use of DME, Decreased mobility, Decreased range of motion, Decreased strength, Dizziness, Impaired flexibility, Prosthetic Dependency, Postural dysfunction, Pain  Visit Diagnosis: Muscle weakness (generalized)  Other abnormalities of gait and mobility  Unsteadiness on feet  Abnormal posture  Repeated falls     Problem List Patient  Active Problem List   Diagnosis Date Noted  . PAD (peripheral artery disease) ([place]) 01/09/2017  . HTN (hypertension) 12/19/2016  . Atherosclerosis of native arteries of the extremities with ulceration ([place]) 12/19/2016  . CHF (congestive heart failure) ([place]) 11/24/2016  . Demand ischemia ([place])   . 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 NSTEMI (non-ST elevated myocardial infarction) ([place]) 11/23/2016  . Acute diastolic CHF (congestive heart failure) ([place]) 11/23/2016  . CAD (coronary artery disease)   . Acute congestive heart failure ([place])   . DDD (degenerative disc disease), lumbar 03/27/2016  . History of recurrent deep vein thrombosis (DVT) 03/26/2016  . History of pulmonary embolism 10/01/2015  . Nephropathy, diabetic ([place]) 10/01/2015  . 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 History of recurrent DVT/E 04/11/2013  . Obstructive sleep apnea 04/11/2013  . Bradycardia 04/11/2013  . History of Elevated CK level  04/11/2013  . Obesity 04/11/2013  . Peripheral neuropathy ([place])   . Charcot's arthropathy   . Chronic obstructive pulmonary disease ([place])   . Chronic diastolic heart failure ([place]) 04/08/2013  . Paroxysmal atrial fibrillation ([place]) 04/07/2013  . Reactive depression ([place]) 05/16/2011  . Long term (current) use of anticoagulants 09/21/2010  . 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 ED (erectile dysfunction) of organic origin 07/28/2010  . Poorly controlled type 2 diabetes mellitus with peripheral neuropathy ([place]) 05/26/2008  . Hyperlipidemia 05/26/2008  . Hypertensive heart disease 05/26/2008    Jamey Reas PT, DPT 11/20/2017, 10:32 PM  [facility] [address] [place], [place], [address] Phone: [phone]   Fax:  [phone]  Name: Bobby Lindsey MRN: [id number] Date of Birth: [birth year]

## 2017-11-20 NOTE — Patient Instructions (Signed)
Access Code: [email or web address]  URL: [email or web address]  Date: 11/20/2017  Prepared by: Jamey Reas   Exercises  Standing with Head Nod - 10 reps - 1 sets - 5 seconds hold - 1x daily - 5x weekly  Romberg Stance with Head Rotation - 10 reps - 1 sets - 5 seconds hold - 1x daily - 5x weekly  Seated Single Knee to Chest - 10 reps - 1 sets - 5 seconds hold - 1x daily - 5x weekly  Seated Trunk Rotation with Arms Crossed - 10 reps - 1 sets - 5 seconds hold - 1x daily - 5x weekly  Seated Hamstring Stretch - 3 reps - 1 sets - 30 seconds hold - 2x daily - 7x weekly  Seated Gastroc Stretch with Strap - 3 reps - 1 sets - 30 seconds hold - 2x daily - 7x weekly  Seated Hamstring Stretch with Strap - 3 reps - 1 sets - 30 seconds hold - 2x daily - 7x weekly

## 2017-11-21 ENCOUNTER — Other Ambulatory Visit: Payer: Self-pay | Admitting: Cardiology

## 2017-11-24 ENCOUNTER — Other Ambulatory Visit: Payer: Self-pay | Admitting: Physician Assistant

## 2017-11-27 ENCOUNTER — Encounter: Payer: Self-pay | Admitting: Physical Therapy

## 2017-11-27 ENCOUNTER — Ambulatory Visit: Payer: Managed Care, Other (non HMO) | Admitting: Physical Therapy

## 2017-11-27 DIAGNOSIS — R2681 Unsteadiness on feet: Secondary | ICD-10-CM

## 2017-11-27 DIAGNOSIS — R2689 Other abnormalities of gait and mobility: Secondary | ICD-10-CM

## 2017-11-27 DIAGNOSIS — M6281 Muscle weakness (generalized): Secondary | ICD-10-CM

## 2017-11-27 DIAGNOSIS — R293 Abnormal posture: Secondary | ICD-10-CM

## 2017-11-27 NOTE — Therapy (Signed)
[facility] [address] [place], [place], [address] Phone: [phone]   Fax:  [phone]  Physical Therapy Treatment  Patient Details  Name: Bobby Lindsey MRN: [id number] Date of Birth: 10-Oct-1955 Referring Provider (PT): Rhina Brackett, MD   Encounter Date: 11/27/2017  PT End of Session - 11/27/17 1118    Visit Number  9    Number of Visits  18    Authorization Type  [employer] Managed Pt has met VL. Wife reports approved for 10 additional visits for now. Wife & pt plan to apply for [place].     Authorization - Visit Number  9    Authorization - Number of Visits  10    PT Start Time  1115   late due to traffic   PT Stop Time  1155    PT Time Calculation (min)  40 min    Equipment Utilized During Treatment  Gait belt    Activity Tolerance  Patient tolerated treatment well;Patient limited by pain    Behavior During Therapy  WFL for tasks assessed/performed       Past Medical History:  Diagnosis Date  . Anginal pain ([place])    last pm  . Atrial fibrillation ([place])    a. Transient during 03/2013 admission.  . Bradycardia    a. Bradycardia/pauses/possible Mobitz II during 03/2013 adm. Not on BB due to this.  Marland Kitchen CAD (coronary artery disease)    a. s/p CABG 2002. b. Hx Cypher stent to the RCA. c. Inf-lat STEMI 03/2013:  LHC (04/05/13):  mLAD occluded, pD1 90, apical br of Dx occluded, CFX occluded, pOM1 90-95, RCA stents patent, diff RCA 30, S-Dx occluded, S-PDA occluded, S-OM1 40-50, L-LAD patent, EF 40% with inf HK.  PCI:  Promus (2.5 x 28) DES to mid to dist CFX.  Marland Kitchen Charcot's joint of knee   . COPD (chronic obstructive pulmonary disease) ([place])   . Deep venous thrombosis (H[facility])    right lower extremity  . Diabetes mellitus    a. A1C 10.7 in 03/2013.  . Diastolic CHF ([place])    a. EF 40% by cath, 55-60% during 03/2013 adm, required IV diuresis.  . DVT, lower extremity, recurrent ([place])    a. Hx  recurrent DVT per record.  . Dyslipidemia   . Elevated CK    a. Pt has refused rheum workup in the past.  . GERD (gastroesophageal reflux disease)   . History of hiatal hernia   . HTN (hypertension)    x 15 years  . Hx of cardiovascular stress test    a. Lexiscan Myoview (03/2010):  diaph atten vs inf scar, no ischemia, EF 47%; Low Risk.  Marland Kitchen Hx of echocardiogram    a. Echo (04/08/13):  Mild LVH, EF 55-60%, restrictive physiology, severe LAE, mild reduced RVSF, mild RAE.  . Leg pain    ABI 6/16:  R 1.2, L 1.1 - normal  . Myocardial infarction ([place])   . Obesity   . Peripheral neuropathy   . Pulmonary embolism ([place])   . Sleep apnea     Past Surgical History:  Procedure Laterality Date  . CORONARY ANGIOPLASTY WITH STENT PLACEMENT    . CORONARY ARTERY BYPASS GRAFT     4 time since 2002  . LEFT HEART CATHETERIZATION WITH CORONARY/GRAFT ANGIOGRAM  04/05/2013   Procedure: LEFT HEART CATHETERIZATION WITH Beatrix Fetters;  Surgeon: Peter M Martinique, MD;  Location: [facility] CATH LAB;  Service: Cardiovascular;;  . left  knee surgery    . LOWER EXTREMITY ANGIOGRAPHY Left 12/25/2016   Procedure: LOWER EXTREMITY ANGIOGRAPHY;  Surgeon: Algernon Huxley, MD;  Location: [facility] CV LAB;  Service: Cardiovascular;  Laterality: Left;  . PERCUTANEOUS CORONARY STENT INTERVENTION (PCI-S)  04/05/2013   Procedure: PERCUTANEOUS CORONARY STENT INTERVENTION (PCI-S);  Surgeon: Peter M Martinique, MD;  Location: [facility] CATH LAB;  Service: Cardiovascular;;  DES to native Mid cx  . VASCULAR SURGERY      There were no vitals filed for this visit.  Subjective Assessment - 11/27/17 1117    Subjective  No new complaints. No falls. To PT today with RW.     Pertinent History  L TTA, DM2, perepheral neuropathy, PVD, CAD, CABG X3, CHF, DVTs, HTN, HLD, A-Fib,     Limitations  Standing;House hold activities;Walking    Patient Stated Goals  To use prosthesis to walk, hunt, fish, return to work [employer] business)     Currently in Pain?  Yes    Pain Score  6     Pain Location  Back    Pain Orientation  Lower    Pain Descriptors / Indicators  Aching;Sore    Pain Type  Chronic pain    Pain Onset  More than a month ago    Pain Frequency  Intermittent    Aggravating Factors   with walking and after walking for a "little bit"    Pain Relieving Factors  sitting down, resting           [facility] Adult PT Treatment/Exercise - 11/27/17 1119      Transfers   Transfers  Sit to Stand;Stand to Sit    Sit to Stand  5: Supervision;With upper extremity assist;With armrests;From chair/3-in-1;From elevated surface    Stand to Sit  5: Supervision;With upper extremity assist;With armrests;To elevated surface;To chair/3-in-1      Ambulation/Gait   Ambulation/Gait  Yes    Ambulation/Gait Assistance  5: Supervision;4: Min assist;4: Min guard    Ambulation/Gait Assistance Details  supervision with RW to enter/exit gym. used cane for short distance gait in session progressing from min assist with HHA opposite cane to min guard assist no HHA on 2cd rep of gait. cues on posture, cane placement and weight shifting with gait.     Ambulation Distance (Feet)  20 Feet   X2   Assistive device  Prosthesis;Rolling walker;Straight cane;1 person hand held assist    Gait Pattern  Step-through pattern;Decreased stride length;Decreased stance time - left;Decreased step length - right;Decreased weight shift to left;Antalgic;Narrow base of support;Trunk flexed    Ambulation Surface  Level;Indoor      High Level Balance   High Level Balance Activities  Side stepping;Marching forwards;Backward walking    High Level Balance Comments  in parallel bars with light fingertip support: 3 laps each with min guard assist, cues on posture, weight shifting and general ex form.      Prosthetics   Current prosthetic wear tolerance (days/week)   daily    Current prosthetic wear tolerance (#hours/day)   all awake hours, drying as needed    Residual  limb condition   scab over incision remains intact. 2 non adhered pieces removed with tweezers.     Education Provided  Residual limb care;Proper weight-bearing schedule/adjustment    Person(s) Educated  Patient    Education Method  Explanation;Demonstration;Verbal cues    Education Method  Verbalized understanding;Verbal cues required;Needs further instruction    Donning Prosthesis  Supervision    Doffing  Prosthesis  Supervision          Balance Exercises - 11/27/17 1140      Balance Exercises: Standing   Standing Eyes Closed  Wide (BOA);Head turns;Foam/compliant surface;Other reps (comment);30 secs;Limitations    Balance Beam  standing across blue foam beam with light support to no support as activity progressed: fwd stepping to floor/back onto beam, then backward stepping to floor/back onto beam. cues for increased step length/increased step height to clear beam surface. min assist for balance.                              Balance Exercises: Standing   Standing Eyes Closed Limitations  standing across blue foam beam with no UE support, occasional touch to bars: EC no head movements, progressing to EC head movements left<>right, then up<>down. min assist with cues for upright posture and weight shifting to assist with balance.           PT Short Term Goals - 11/20/17 2226      PT SHORT TERM GOAL #1   Title  Patient verbalizes proper adjustment of ply socks with limb volume changes. (All updated STGs Target Date: 12/07/2017)    Status  New    Target Date  12/07/17      PT SHORT TERM GOAL #2   Title  Patient reports daily wear of prosthesis >12 hrs without increase in skin issues.     Status  New    Target Date  12/07/17      PT SHORT TERM GOAL #3   Title  Patient ambulates 100' with cane & prosthesis with minimal assist.     Status  New    Target Date  12/07/17      PT SHORT TERM GOAL #4   Title  Patient negotiates ramps, curbs with cane & stairs with 1 rail/cane with  prosthesis with minimal assist.     Status  New    Target Date  12/07/17      PT SHORT TERM GOAL #5   Title  Patient performs standing balance with cane support & prosthesis reaching 10" anteriorly, picking up 1# object from floor & scanning environment with supervision, no balance losses.     Status  New    Target Date  12/07/17        PT Long Term Goals - 10/23/17 1542      PT LONG TERM GOAL #1   Title  Patient verbalizes & demonstrates proper prosthetic care to enable safe use of prosthesis. (All LTGs Target Date: 12/28/2017)    Baseline       Time  3    Period  Months    Status  On-going    Target Date  12/28/17      PT LONG TERM GOAL #2   Title  Patient tolerates wear of prosthesis >90% of awake hours without skin issues or limb pain >2/10 to enable function throughout his day.     Baseline        Time  3    Period  Months    Status  On-going    Target Date  12/28/17      PT LONG TERM GOAL #3   Title  Berg Balance >36/56 to indicate lower fall risk & less dependency in standing ADLs.     Baseline        Time  3    Period  Months  Status  On-going    Target Date  12/28/17      PT LONG TERM GOAL #4   Title  Patient ambulates 500' outdoors including paved & grass surfaces with LRAD & prosthesis modified independent to enable community mobility.     Baseline        Time  3    Period  Months    Status  On-going    Target Date  12/28/17      PT LONG TERM GOAL #5   Title  Patient negotiates ramps, curbs & stairs with LRAD & prosthesis modified independent to enable community access.     Baseline        Time  3    Period  Months    Status  On-going    Target Date  12/28/17      PT LONG TERM GOAL #6   Title  Patient ambulates 100' around furniture carrying plate or cup with cane or less & prosthesis modified independent.     Baseline        Time  3    Period  Months    Status  On-going    Target Date  12/28/17            Plan - 11/27/17 1119     Clinical Impression Statement  Today's skilled session focused on gait with cane for short distances and balance reactions with decreased UE support. Pt with incr low back pain with gait/balance activities that was relieved with seated rest breaks. Did progress to min guard assist with 2cd bout of gait with cane vs min assist with 1st bout of gait. The pt is progressing toward goals and should benefit from continued PT to progress toward unmet goals.     Rehab Potential  Good    Clinical Impairments Affecting Rehab Potential  weakness BUEs, BLEs & trunk, impaired balance with high fall risk, dependent gait with prosthesis, dependent in proper prosthetic care & use    PT Frequency  1x / week   2x/wk for 5 weeks, then 1x/wk for 8 weeks   PT Duration  Other (comment)   13 weeks (3 months)   PT Treatment/Interventions  ADLs/Self Care Home Management;Canalith Repostioning;DME Instruction;Gait training;Stair training;Functional mobility training;Therapeutic activities;Therapeutic exercise;Balance training;Patient/family education;Neuromuscular re-education;Prosthetic Training;Manual techniques;Vestibular    PT Next Visit Plan  10 visit next session (? last approved visit by [employer]); monitor skin/scab of limb, continue with gait around table/at counter with cane, work on balance with decreased UE support    Consulted and Agree with Plan of Care  Patient       Patient will benefit from skilled therapeutic intervention in order to improve the following deficits and impairments:  Abnormal gait, Decreased activity tolerance, Decreased balance, Decreased coordination, Decreased endurance, Decreased knowledge of use of DME, Decreased mobility, Decreased range of motion, Decreased strength, Dizziness, Impaired flexibility, Prosthetic Dependency, Postural dysfunction, Pain  Visit Diagnosis: Muscle weakness (generalized)  Other abnormalities of gait and mobility  Unsteadiness on feet  Abnormal  posture     Problem List Patient Active Problem List   Diagnosis Date Noted  . PAD (peripheral artery disease) ([place]) 01/09/2017  . HTN (hypertension) 12/19/2016  . Atherosclerosis of native arteries of the extremities with ulceration ([place]) 12/19/2016  . CHF (congestive heart failure) ([place]) 11/24/2016  . Demand ischemia ([place])   . 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 NSTEMI (  non-ST elevated myocardial infarction) ([place]) 11/23/2016  . Acute diastolic CHF (congestive heart failure) ([place]) 11/23/2016  . CAD (coronary artery disease)   . Acute congestive heart failure ([place])   . DDD (degenerative disc disease), lumbar 03/27/2016  . History of recurrent deep vein thrombosis (DVT) 03/26/2016  . History of pulmonary embolism 10/01/2015  . Nephropathy, diabetic ([place]) 10/01/2015  . 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 History of recurrent DVT/E 04/11/2013  . Obstructive sleep apnea 04/11/2013  . Bradycardia 04/11/2013  . History of Elevated CK level  04/11/2013  . Obesity 04/11/2013  . Peripheral neuropathy ([place])   . Charcot's arthropathy   . Chronic obstructive pulmonary disease ([place])   . Chronic diastolic heart failure ([place]) 04/08/2013  . Paroxysmal atrial fibrillation ([place]) 04/07/2013  . Reactive depression (situ[place]) 05/16/2011  . Long term (current) use of anticoagulants 09/21/2010  . 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 ED (erectile dysfunction) of organic origin 07/28/2010  . Poorly controlled type 2 diabetes mellitus with peripheral neuropathy ([place]) 05/26/2008  . Hyperlipidemia 05/26/2008  . Hypertensive heart disease 05/26/2008    Willow Ora, PTA, [facility] [address], [place], [place] [address] [phone] 11/27/17, 3:23 PM   Name: Bobby Lindsey MRN: [id number] Date of Birth: May 21, 1955

## 2017-12-04 ENCOUNTER — Ambulatory Visit: Payer: Managed Care, Other (non HMO) | Admitting: Physical Therapy

## 2017-12-04 ENCOUNTER — Encounter: Payer: Self-pay | Admitting: Physical Therapy

## 2017-12-04 DIAGNOSIS — M6281 Muscle weakness (generalized): Secondary | ICD-10-CM

## 2017-12-04 DIAGNOSIS — R2681 Unsteadiness on feet: Secondary | ICD-10-CM

## 2017-12-04 DIAGNOSIS — R293 Abnormal posture: Secondary | ICD-10-CM

## 2017-12-04 DIAGNOSIS — R2689 Other abnormalities of gait and mobility: Secondary | ICD-10-CM

## 2017-12-05 ENCOUNTER — Ambulatory Visit (INDEPENDENT_AMBULATORY_CARE_PROVIDER_SITE_OTHER): Payer: Managed Care, Other (non HMO)

## 2017-12-05 DIAGNOSIS — Z5181 Encounter for therapeutic drug level monitoring: Secondary | ICD-10-CM | POA: Diagnosis not present

## 2017-12-05 DIAGNOSIS — Z86718 Personal history of other venous thrombosis and embolism: Secondary | ICD-10-CM | POA: Diagnosis not present

## 2017-12-05 DIAGNOSIS — Z86711 Personal history of pulmonary embolism: Secondary | ICD-10-CM | POA: Diagnosis not present

## 2017-12-05 DIAGNOSIS — I2119 ST elevation (STEMI) myocardial infarction involving other coronary artery of inferior wall: Secondary | ICD-10-CM

## 2017-12-05 DIAGNOSIS — Z7901 Long term (current) use of anticoagulants: Secondary | ICD-10-CM

## 2017-12-05 DIAGNOSIS — I480 Paroxysmal atrial fibrillation: Secondary | ICD-10-CM

## 2017-12-05 LAB — POCT INR: INR: 2.1 (ref 2.0–3.0)

## 2017-12-05 NOTE — Patient Instructions (Signed)
Please continue dosage of 2 tablets daily except 1 TABLET ON MONDAYS & FRIDAYS. Recheck in 5 weeks.

## 2017-12-05 NOTE — Therapy (Signed)
[facility] [address] [place], [place], [address] Phone: [phone]   Fax:  [phone]  Physical Therapy Treatment  Patient Details  Name: Bobby Lindsey MRN: [id number] Date of Birth: 1955/10/04 Referring Provider (PT): Rhina Brackett, MD   Encounter Date: 12/04/2017  PT End of Session - 12/04/17 1337    Visit Number  10    Number of Visits  18    Authorization Type  [employer] Managed Pt has met VL. Wife reports approved for 10 additional visits for now. Wife & pt plan to apply for [place].     Authorization - Visit Number  10    Authorization - Number of Visits  10    PT Start Time  1110    PT Stop Time  1200    PT Time Calculation (min)  50 min    Equipment Utilized During Treatment  Gait belt    Activity Tolerance  Patient tolerated treatment well;Patient limited by pain    Behavior During Therapy  WFL for tasks assessed/performed       Past Medical History:  Diagnosis Date  . Anginal pain ([place])    last pm  . Atrial fibrillation ([place])    a. Transient during 03/2013 admission.  . Bradycardia    a. Bradycardia/pauses/possible Mobitz II during 03/2013 adm. Not on BB due to this.  Marland Kitchen CAD (coronary artery disease)    a. s/p CABG 2002. b. Hx Cypher stent to the RCA. c. Inf-lat STEMI 03/2013:  LHC (04/05/13):  mLAD occluded, pD1 90, apical br of Dx occluded, CFX occluded, pOM1 90-95, RCA stents patent, diff RCA 30, S-Dx occluded, S-PDA occluded, S-OM1 40-50, L-LAD patent, EF 40% with inf HK.  PCI:  Promus (2.5 x 28) DES to mid to dist CFX.  Marland Kitchen Charcot's joint of knee   . COPD (chronic obstructive pulmonary disease) ([place])   . Deep venous thrombosis (H[facility])    right lower extremity  . Diabetes mellitus    a. A1C 10.7 in 03/2013.  . Diastolic CHF ([place])    a. EF 40% by cath, 55-60% during 03/2013 adm, required IV diuresis.  . DVT, lower extremity, recurrent ([place])    a. Hx recurrent DVT per record.   . Dyslipidemia   . Elevated CK    a. Pt has refused rheum workup in the past.  . GERD (gastroesophageal reflux disease)   . History of hiatal hernia   . HTN (hypertension)    x 15 years  . Hx of cardiovascular stress test    a. Lexiscan Myoview (03/2010):  diaph atten vs inf scar, no ischemia, EF 47%; Low Risk.  Marland Kitchen Hx of echocardiogram    a. Echo (04/08/13):  Mild LVH, EF 55-60%, restrictive physiology, severe LAE, mild reduced RVSF, mild RAE.  . Leg pain    ABI 6/16:  R 1.2, L 1.1 - normal  . Myocardial infarction ([place])   . Obesity   . Peripheral neuropathy   . Pulmonary embolism ([place])   . Sleep apnea     Past Surgical History:  Procedure Laterality Date  . CORONARY ANGIOPLASTY WITH STENT PLACEMENT    . CORONARY ARTERY BYPASS GRAFT     4 time since 2002  . LEFT HEART CATHETERIZATION WITH CORONARY/GRAFT ANGIOGRAM  04/05/2013   Procedure: LEFT HEART CATHETERIZATION WITH Beatrix Fetters;  Surgeon: Peter M Martinique, MD;  Location: [facility] CATH LAB;  Service: Cardiovascular;;  . left knee surgery    .  LOWER EXTREMITY ANGIOGRAPHY Left 12/25/2016   Procedure: LOWER EXTREMITY ANGIOGRAPHY;  Surgeon: Algernon Huxley, MD;  Location: [facility] CV LAB;  Service: Cardiovascular;  Laterality: Left;  . PERCUTANEOUS CORONARY STENT INTERVENTION (PCI-S)  04/05/2013   Procedure: PERCUTANEOUS CORONARY STENT INTERVENTION (PCI-S);  Surgeon: Peter M Martinique, MD;  Location: [facility] CATH LAB;  Service: Cardiovascular;;  DES to native Mid cx  . VASCULAR SURGERY      There were no vitals filed for this visit.  Subjective Assessment - 12/04/17 1110    Subjective  No falls. Wearing prosthesis most of awake hours. Using cane some.     Pertinent History  L TTA, DM2, perepheral neuropathy, PVD, CAD, CABG X3, CHF, DVTs, HTN, HLD, A-Fib,     Limitations  Standing;House hold activities;Walking    Patient Stated Goals  To use prosthesis to walk, hunt, fish, return to work [employer] business)    Currently in  Pain?  Yes    Pain Score  5    up to 9/10 with walking longer distance, eases to 0/10 within couple minutes of sitting   Pain Location  Back    Pain Orientation  Lower    Pain Descriptors / Indicators  Aching;Sore    Pain Type  Chronic pain    Pain Onset  More than a month ago    Pain Frequency  Intermittent    Aggravating Factors   walking & standing    Pain Relieving Factors  sitting down, resting       Prosthetic Training with Transtibial prosthesis Scab on limb / incision continues to dry around edge. PT debrided dry area of scab with no bleeding. PT instructed with demo use of rollator walker including sitting on seat and use of brakes. PT discussed rollator differences including wheel size, weight & how it folds. Pt ambulated 200' with rollator walker with minimal cues and no back pain. PT demo how to negotiate ramps & curbs with rollator & pt return demo understanding. Pt verbalizes understanding to benefits to use of rollator for longer distances with seat to rest and increasing activity level without exacerbating back pain.                          PT Short Term Goals - 11/20/17 2226      PT SHORT TERM GOAL #1   Title  Patient verbalizes proper adjustment of ply socks with limb volume changes. (All updated STGs Target Date: 12/07/2017)    Status  New    Target Date  12/07/17      PT SHORT TERM GOAL #2   Title  Patient reports daily wear of prosthesis >12 hrs without increase in skin issues.     Status  New    Target Date  12/07/17      PT SHORT TERM GOAL #3   Title  Patient ambulates 100' with cane & prosthesis with minimal assist.     Status  New    Target Date  12/07/17      PT SHORT TERM GOAL #4   Title  Patient negotiates ramps, curbs with cane & stairs with 1 rail/cane with prosthesis with minimal assist.     Status  New    Target Date  12/07/17      PT SHORT TERM GOAL #5   Title  Patient performs standing balance with cane support &  prosthesis reaching 10" anteriorly, picking up 1# object from floor &  scanning environment with supervision, no balance losses.     Status  New    Target Date  12/07/17        PT Long Term Goals - 12/04/17 1800      PT LONG TERM GOAL #1   Title  Patient verbalizes & demonstrates proper prosthetic care to enable safe use of prosthesis. (All LTGs Target Date: 01/01/2018)    Baseline       Time  3    Period  Months    Status  On-going    Target Date  01/01/18      PT LONG TERM GOAL #2   Title  Patient tolerates wear of prosthesis >90% of awake hours without skin issues or limb pain >2/10 to enable function throughout his day.     Baseline        Time  3    Period  Months    Status  On-going    Target Date  01/01/18      PT LONG TERM GOAL #3   Title  Berg Balance >36/56 to indicate lower fall risk & less dependency in standing ADLs.     Baseline        Time  3    Period  Months    Status  On-going    Target Date  01/01/18      PT LONG TERM GOAL #4   Title  Patient ambulates 500' outdoors including paved & grass surfaces with LRAD & prosthesis modified independent to enable community mobility.     Baseline        Time  3    Period  Months    Status  On-going    Target Date  01/01/18      PT LONG TERM GOAL #5   Title  Patient negotiates ramps, curbs & stairs with LRAD & prosthesis modified independent to enable community access.     Baseline        Time  3    Period  Months    Status  On-going    Target Date  01/01/18      PT LONG TERM GOAL #6   Title  Patient ambulates 100' around furniture carrying plate or cup with cane or less & prosthesis modified independent.     Baseline        Time  3    Period  Months    Status  On-going    Target Date  01/01/18            Plan - 12/04/17 1800    Clinical Impression Statement  Today was week 8 of 13 in plan of care and 10th visit that was approved by insurance company per wife who called insurance company prior to  evaluation. Wife plans to call again to try to get additional visits. Patient wants to continue PT but reduce frequency to every other week. Patient would benefit from 5 additional visits to meet maximal function. Today's session focused on use of rollator walker to enable higher levels of activity without exerbating his back pain.     Rehab Potential  Good    Clinical Impairments Affecting Rehab Potential  weakness BUEs, BLEs & trunk, impaired balance with high fall risk, dependent gait with prosthesis, dependent in proper prosthetic care & use    PT Frequency  1x / week   2x/wk for 5 weeks, then 1x/wk for 8 weeks   PT Duration  Other (comment)   13  weeks (3 months)   PT Treatment/Interventions  ADLs/Self Care Home Management;Canalith Repostioning;DME Instruction;Gait training;Stair training;Functional mobility training;Therapeutic activities;Therapeutic exercise;Balance training;Patient/family education;Neuromuscular re-education;Prosthetic Training;Manual techniques;Vestibular    PT Next Visit Plan  monitor skin/scab of limb, check if purchased rollator walker and any issues, balance & gait with cane    Consulted and Agree with Plan of Care  Patient       Patient will benefit from skilled therapeutic intervention in order to improve the following deficits and impairments:  Abnormal gait, Decreased activity tolerance, Decreased balance, Decreased coordination, Decreased endurance, Decreased knowledge of use of DME, Decreased mobility, Decreased range of motion, Decreased strength, Dizziness, Impaired flexibility, Prosthetic Dependency, Postural dysfunction, Pain  Visit Diagnosis: Muscle weakness (generalized)  Other abnormalities of gait and mobility  Unsteadiness on feet  Abnormal posture     Problem List Patient Active Problem List   Diagnosis Date Noted  . PAD (peripheral artery disease) ([place]) 01/09/2017  . HTN (hypertension) 12/19/2016  . Atherosclerosis of native arteries of  the extremities with ulceration ([place]) 12/19/2016  . CHF (congestive heart failure) ([place]) 11/24/2016  . Demand ischemia ([place])   . 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 NSTEMI (non-ST elevated myocardial infarction) ([place]) 11/23/2016  . Acute diastolic CHF (congestive heart failure) ([place]) 11/23/2016  . CAD (coronary artery disease)   . Acute congestive heart failure ([place])   . DDD (degenerative disc disease), lumbar 03/27/2016  . History of recurrent deep vein thrombosis (DVT) 03/26/2016  . History of pulmonary embolism 10/01/2015  . Nephropathy, diabetic ([place]) 10/01/2015  . 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 History of recurrent DVT/E 04/11/2013  . Obstructive sleep apnea 04/11/2013  . Bradycardia 04/11/2013  . History of Elevated CK level  04/11/2013  . Obesity 04/11/2013  . Peripheral neuropathy ([place])   . Charcot's arthropathy   . Chronic obstructive pulmonary disease ([place])   . Chronic diastolic heart failure ([place]) 04/08/2013  . Paroxysmal atrial fibrillation ([place]) 04/07/2013  . Reactive depression ([place]) 05/16/2011  . Long term (current) use of anticoagulants 09/21/2010  . 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 ED (erectile dysfunction) of organic origin 07/28/2010  . Poorly controlled type 2 diabetes mellitus with peripheral neuropathy ([place]) 05/26/2008  . Hyperlipidemia 05/26/2008  . Hypertensive heart disease 05/26/2008    Jamey Reas PT, DPT 12/05/2017, 6:14 AM  [facility] [address] [place], [place], [address] Phone: [phone]   Fax:  [phone]  Name: Bobby Lindsey MRN: [id number] Date of Birth: 05/29/1955

## 2017-12-06 ENCOUNTER — Ambulatory Visit: Payer: Self-pay | Admitting: Emergency Medicine

## 2017-12-06 ENCOUNTER — Telehealth: Payer: Self-pay | Admitting: Emergency Medicine

## 2017-12-06 VITALS — BP 108/58 | HR 58 | Temp 98.5°F | Resp 14

## 2017-12-06 DIAGNOSIS — J029 Acute pharyngitis, unspecified: Secondary | ICD-10-CM

## 2017-12-06 DIAGNOSIS — IMO0002 Reserved for concepts with insufficient information to code with codable children: Secondary | ICD-10-CM | POA: Insufficient documentation

## 2017-12-06 DIAGNOSIS — K122 Cellulitis and abscess of mouth: Secondary | ICD-10-CM

## 2017-12-06 DIAGNOSIS — R0602 Shortness of breath: Secondary | ICD-10-CM | POA: Insufficient documentation

## 2017-12-06 DIAGNOSIS — E1165 Type 2 diabetes mellitus with hyperglycemia: Secondary | ICD-10-CM | POA: Insufficient documentation

## 2017-12-06 DIAGNOSIS — Z794 Long term (current) use of insulin: Secondary | ICD-10-CM

## 2017-12-06 LAB — POCT RAPID STREP A (OFFICE): Rapid Strep A Screen: NEGATIVE

## 2017-12-06 MED ORDER — AMOXICILLIN 875 MG PO TABS: 875.0000 mg | ORAL_TABLET | Freq: Two times a day (BID) | ORAL | 0 refills | Status: DC

## 2017-12-06 NOTE — Telephone Encounter (Signed)
Message sent to [facility] at the Coumadin clinic regarding patient being placed on amoxicillin.

## 2017-12-06 NOTE — Patient Instructions (Signed)
Take antibiotics If you develop worsening symptoms please go to the ER or see your PCP Notify the coumadin clinic you are now on amoxicillin      Uvulitis Uvulitis is infection or inflammation of the uvula. The uvula is the small, finger-like piece of tissue that hangs down at the back of your throat. What are the causes? This condition may be caused by:  An infection in the mouth or throat. This is the most common cause.  Trauma to the uvula. Causes of trauma include burning your mouth and heavy snoring.  Fluid build-up (edema). Edema can be triggered be an allergic reaction. Uvulitis that is caused by edema is called Quincke disease.  Inhaling irritants, such as chemical agents, smoke, or steam.  What are the signs or symptoms? Symptoms of this condition depend on the cause. Symptoms of uvulitis that is caused by infection include:  Red, swollen uvula.  Sore throat.  Fever.  Headache.  Swollen neck glands.  Symptoms of uvulitis that is caused by trauma, edema, or irritation include:  Red, swollen uvula.  Sore throat.  Trouble swallowing.  Choking or gagging.  Trouble breathing.  How is this diagnosed? This condition is diagnosed with a physical exam. You also may have tests, such as a throat culture and blood tests. How is this treated? Treatment for this condition depends on the cause. Treatment may involve:  Antibiotic medicine. Antibiotics may be prescribed if a bacterial infection is the cause.  Steroid medicine. Steroids may be given if edema is the cause.  Surgery to remove part of the uvula (partial uvulectomy).  Follow these instructions at home:  Rest as much as possible until your condition improves.  Drink enough fluid to keep your urine clear or pale yellow.  Take over-the-counter and prescription medicines only as told by your health care provider.  If you were prescribed an antibiotic medicine, take it as told by your health care  provider. Do not stop taking the antibiotic even if you start to feel better.  Use a cool-mist humidifier to ease irritation in your throat.  While your throat is sore: ? Eat soft foods or drink liquids, such as soup. ? Gargle with a salt-water mixture 3-4 times per day or as needed. To make a salt-water mixture, completely dissolve -1 tsp of salt in 1 cup of warm water.  Keep all follow-up visits as told by your health care provider. This is important. Contact a health care provider if:  You have a fever.  You have trouble eating.  Your symptoms do not get better.  Your symptoms come back after treatment. Get help right away if:  You have trouble breathing.  You have trouble swallowing. This information is not intended to replace advice given to you by your health care provider. Make sure you discuss any questions you have with your health care provider. Document Released: 09/10/2003 Document Revised: 10/03/2015 Document Reviewed: 04/22/2014 Elsevier Interactive Patient Education  2018 [employer].

## 2017-12-06 NOTE — Progress Notes (Signed)
Subjective. Patient enters with onset last night of a sore throat.  He has not had fever.  He has not had any known strep exposure.  He does not feel his glands are swollen.  He feels a sensation of a ball in the back of his throat.  He does feel that his uvula is swollen.  He does not have any nasal congestion or cough. Review of systems. Significant history of diabetes and heart disease.  He is also on Coumadin regularly.  Earlier in the month he was prescribed Astepro for his sore throat. Objective. Alert and cooperative in no distress. TM normal. Nose no drainage. Throat there is redness involving both tonsils which extends over the rim of the soft palate and the uvula.  There are no whitish lesions suggestive of yeast. Chest. Clear to auscultation percussion. Extremities. Patient is an amputee with prosthesis. Assessment. Patient presents with sore throat with physical findings of a tonsillitis/uvulitis.  Strep test was negative.  Exam did not appear to be yeast.  We did not have throat culture materials in the office. Plan. I tried to call his PCP to notify them of his treatment but they were at lunch.. Amoxicillin 875 twice daily for 7 days. He was advised to notify the Coumadin clinic that he was on this antibiotic I advised him he could use his azelastine spray he has at home This did not appear to be a yeast infection with no involvement of the buccal mucosa or hard palate.

## 2017-12-07 NOTE — Addendum Note (Signed)
Addended by: Judie Petit on: 12/07/2017 02:04 PM   Modules accepted: Level of Service

## 2017-12-11 ENCOUNTER — Ambulatory Visit: Payer: Managed Care, Other (non HMO)

## 2017-12-18 ENCOUNTER — Ambulatory Visit: Payer: Managed Care, Other (non HMO) | Attending: Physical Medicine & Rehabilitation

## 2017-12-18 DIAGNOSIS — M6281 Muscle weakness (generalized): Secondary | ICD-10-CM | POA: Diagnosis present

## 2017-12-18 DIAGNOSIS — R2681 Unsteadiness on feet: Secondary | ICD-10-CM | POA: Insufficient documentation

## 2017-12-18 DIAGNOSIS — R293 Abnormal posture: Secondary | ICD-10-CM | POA: Insufficient documentation

## 2017-12-18 DIAGNOSIS — R2689 Other abnormalities of gait and mobility: Secondary | ICD-10-CM | POA: Insufficient documentation

## 2017-12-18 DIAGNOSIS — R296 Repeated falls: Secondary | ICD-10-CM | POA: Diagnosis present

## 2017-12-18 NOTE — Therapy (Signed)
[facility] [address] [place], [place], [address] Phone: [phone]   Fax:  [phone]  Physical Therapy Treatment  Patient Details  Name: Bobby Lindsey MRN: [id number] Date of Birth: 1956-01-09 Referring Provider (PT): Richarda Overlie, MD   Encounter Date: 12/18/2017  PT End of Session - 12/18/17 1108    Visit Number  11   pt aware of self pay after 10 approved visits.    Number of Visits  18    Authorization Type  [employer] Managed Pt has met VL. Wife reports approved for 10 additional visits for now. Wife & pt plan to apply for [facility] Assistance.     Authorization - Visit Number  11   pt aware of self pay after 10 approved visits.   Authorization - Number of Visits  10    PT Start Time  1103    PT Stop Time  1148    PT Time Calculation (min)  45 min    Equipment Utilized During Treatment  Gait belt    Activity Tolerance  Patient tolerated treatment well;Patient limited by pain    Behavior During Therapy  WFL for tasks assessed/performed       Past Medical History:  Diagnosis Date  . Anginal pain (HCC)    last pm  . Atrial fibrillation (HCC)    a. Transient during 03/2013 admission.  . Bradycardia    a. Bradycardia/pauses/possible Mobitz II during 03/2013 adm. Not on BB due to this.  Marland Kitchen CAD (coronary artery disease)    a. s/p CABG 2002. b. Hx Cypher stent to the RCA. c. Inf-lat STEMI 03/2013:  LHC (04/05/13):  mLAD occluded, pD1 90, apical br of Dx occluded, CFX occluded, pOM1 90-95, RCA stents patent, diff RCA 30, S-Dx occluded, S-PDA occluded, S-OM1 40-50, L-LAD patent, EF 40% with inf HK.  PCI:  Promus (2.5 x 28) DES to mid to dist CFX.  Marland Kitchen Charcot's joint of knee   . COPD (chronic obstructive pulmonary disease) (HCC)   . Deep venous thrombosis (HCC)    right lower extremity  . Diabetes mellitus    a. A1C 10.7 in 03/2013.  . Diastolic CHF (HCC)    a. EF 40% by cath, 55-60% during 03/2013 adm,  required IV diuresis.  . DVT, lower extremity, recurrent (HCC)    a. Hx recurrent DVT per record.  . Dyslipidemia   . Elevated CK    a. Pt has refused rheum workup in the past.  . GERD (gastroesophageal reflux disease)   . History of hiatal hernia   . HTN (hypertension)    x 15 years  . Hx of cardiovascular stress test    a. Lexiscan Myoview (03/2010):  diaph atten vs inf scar, no ischemia, EF 47%; Low Risk.  Marland Kitchen Hx of echocardiogram    a. Echo (04/08/13):  Mild LVH, EF 55-60%, restrictive physiology, severe LAE, mild reduced RVSF, mild RAE.  . Leg pain    ABI 6/16:  R 1.2, L 1.1 - normal  . Myocardial infarction (HCC)   . Obesity   . Peripheral neuropathy   . Pulmonary embolism (HCC)   . Sleep apnea     Past Surgical History:  Procedure Laterality Date  . CORONARY ANGIOPLASTY WITH STENT PLACEMENT    . CORONARY ARTERY BYPASS GRAFT     4 time since 2002  . LEFT HEART CATHETERIZATION WITH CORONARY/GRAFT ANGIOGRAM  04/05/2013   Procedure: LEFT HEART CATHETERIZATION WITH CORONARY/GRAFT ANGIOGRAM;  Surgeon: Peter M Swaziland, MD;  Location: [facility] CATH LAB;  Service: Cardiovascular;;  . left knee surgery    . LOWER EXTREMITY ANGIOGRAPHY Left 12/25/2016   Procedure: LOWER EXTREMITY ANGIOGRAPHY;  Surgeon: Annice Needy, MD;  Location: [facility] INVASIVE CV LAB;  Service: Cardiovascular;  Laterality: Left;  . PERCUTANEOUS CORONARY STENT INTERVENTION (PCI-S)  04/05/2013   Procedure: PERCUTANEOUS CORONARY STENT INTERVENTION (PCI-S);  Surgeon: Peter M Swaziland, MD;  Location: [facility] CATH LAB;  Service: Cardiovascular;;  DES to native [place]  . VASCULAR SURGERY      There were no vitals filed for this visit.  Subjective Assessment - 12/18/17 1107    Subjective  No new falls to report. Continues to wear prosthesis most awake hours. Pt uses W/C at home and cane in community.     Pertinent History  L TTA, DM2, perepheral neuropathy, PVD, CAD, CABG X3, CHF, DVTs, HTN, HLD, A-Fib,     Limitations  Standing;House  hold activities;Walking    How long can you sit comfortably?  as long as he wants to sit    How long can you stand comfortably?  unable    How long can you walk comfortably?  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  No/denies        [facility] Adult PT Treatment/Exercise - 12/18/17 1109      Ambulation/Gait   Ambulation/Gait  Yes    Ambulation/Gait Assistance  4: Min guard;4: Min assist    Ambulation/Gait Assistance Details  In hallway scanning while making head turns/nods/diagonals with min/mod sway, 2 episodes of LOB with pt able to correct. One seated rest break required due to fatigue/LBP.    Ambulation Distance (Feet)  450 Feet   1 seated rest break   Assistive device  Prosthesis;Straight cane    Gait Pattern  Step-through pattern;Decreased stride length;Decreased stance time - left;Decreased step length - right;Decreased weight shift to left;Antalgic;Narrow base of support;Trunk flexed    Ambulation Surface  Level;Indoor      Exercises   Exercises  Knee/Hip;Ankle      Knee/Hip Exercises: Seated   Long Arc Quad  AROM;Strengthening;Both;1 set;10 reps    Marching  AROM;Strengthening;Both;1 set;10 reps    Abduction/Adduction   AROM;Strengthening;Both;1 set;10 reps;Other (comment)   with GTB     Ankle Exercises: Seated   Heel Raises  Both;10 reps    Toe Raise  10 reps        PT Education - 12/18/17 1443    Education provided  Yes    Education Details  Initiated strengthening HEP with BLE seated exercises.    Person(s) Educated  Patient;Spouse    Methods  Explanation;Demonstration;Verbal cues;Handout    Comprehension  Verbalized understanding;Returned demonstration;Verbal cues required;Need further instruction       PT Short Term Goals - 11/20/17 2226      PT SHORT TERM GOAL #1   Title  Patient verbalizes proper adjustment of ply socks with limb volume changes. (All updated  STGs Target Date: 12/07/2017)    Status  New    Target Date  12/07/17      PT SHORT TERM GOAL #2   Title  Patient reports daily wear of prosthesis >12 hrs without increase in skin issues.     Status  New    Target Date  12/07/17      PT SHORT TERM GOAL #3  Title  Patient ambulates 100' with cane & prosthesis with minimal assist.     Status  New    Target Date  12/07/17      PT SHORT TERM GOAL #4   Title  Patient negotiates ramps, curbs with cane & stairs with 1 rail/cane with prosthesis with minimal assist.     Status  New    Target Date  12/07/17      PT SHORT TERM GOAL #5   Title  Patient performs standing balance with cane support & prosthesis reaching 10" anteriorly, picking up 1# object from floor & scanning environment with supervision, no balance losses.     Status  New    Target Date  12/07/17        PT Long Term Goals - 12/04/17 1800      PT LONG TERM GOAL #1   Title  Patient verbalizes & demonstrates proper prosthetic care to enable safe use of prosthesis. (All LTGs Target Date: 01/01/2018)    Baseline       Time  3    Period  Months    Status  On-going    Target Date  01/01/18      PT LONG TERM GOAL #2   Title  Patient tolerates wear of prosthesis >90% of awake hours without skin issues or limb pain >2/10 to enable function throughout his day.     Baseline        Time  3    Period  Months    Status  On-going    Target Date  01/01/18      PT LONG TERM GOAL #3   Title  Berg Balance >36/56 to indicate lower fall risk & less dependency in standing ADLs.     Baseline        Time  3    Period  Months    Status  On-going    Target Date  01/01/18      PT LONG TERM GOAL #4   Title  Patient ambulates 500' outdoors including paved & grass surfaces with LRAD & prosthesis modified independent to enable community mobility.     Baseline        Time  3    Period  Months    Status  On-going    Target Date  01/01/18      PT LONG TERM GOAL #5   Title  Patient  negotiates ramps, curbs & stairs with LRAD & prosthesis modified independent to enable community access.     Baseline        Time  3    Period  Months    Status  On-going    Target Date  01/01/18      PT LONG TERM GOAL #6   Title  Patient ambulates 100' around furniture carrying plate or cup with cane or less & prosthesis modified independent.     Baseline        Time  3    Period  Months    Status  On-going    Target Date  01/01/18        Plan - 12/18/17 1444    Clinical Impression Statement  Todays skilled session focused on gait with cane/prosthesis while scanning for objects and BLE strengthening to initiate HEP. Pt presented with some LBP and fatigue after ambulation with relief after sitting. Pt should benefit from continued PT sessions to progress towards goals.     Rehab Potential  Good  Clinical Impairments Affecting Rehab Potential  weakness BUEs, BLEs & trunk, impaired balance with high fall risk, dependent gait with prosthesis, dependent in proper prosthetic care & use    PT Frequency  1x / week   2x/wk for 5 weeks, then 1x/wk for 8 weeks   PT Duration  Other (comment)   13 weeks (3 months)   PT Treatment/Interventions  ADLs/Self Care Home Management;Canalith Repostioning;DME Instruction;Gait training;Stair training;Functional mobility training;Therapeutic activities;Therapeutic exercise;Balance training;Patient/family education;Neuromuscular re-education;Prosthetic Training;Manual techniques;Vestibular    PT Next Visit Plan  monitor skin/scab of limb, balance & gait with cane, check circular areas on skin under liner.     Consulted and Agree with Plan of Care  Patient       Patient will benefit from skilled therapeutic intervention in order to improve the following deficits and impairments:  Abnormal gait, Decreased activity tolerance, Decreased balance, Decreased coordination, Decreased endurance, Decreased knowledge of use of DME, Decreased mobility, Decreased range  of motion, Decreased strength, Dizziness, Impaired flexibility, Prosthetic Dependency, Postural dysfunction, Pain  Visit Diagnosis: Muscle weakness (generalized)  Other abnormalities of gait and mobility  Unsteadiness on feet  Abnormal posture     Problem List Patient Active Problem List   Diagnosis Date Noted  . SOB (shortness of breath) 12/06/2017  . Uncontrolled type 2 diabetes mellitus with insulin therapy (HCC) 12/06/2017  . Primary osteoarthritis of left knee 11/02/2017  . Open wound 06/05/2017  . Diabetic ulcer of left lower leg (HCC) 05/16/2017  . Acquired absence of left leg below knee (HCC) 02/28/2017  . Impaired mobility and activities of daily living 02/15/2017  . Atherosclerotic heart disease of native coronary artery without angina pectoris 01/05/2017  . HTN (hypertension) 12/19/2016  . Atherosclerosis of native arteries of the extremities with ulceration (HCC) 12/19/2016  . Peripheral vascular disease (HCC) 12/07/2016  . CHF (congestive heart failure) (HCC) 11/24/2016  . Demand ischemia (HCC)   . 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 NSTEMI (non-ST elevated myocardial infarction) (HCC) 11/23/2016  . Acute diastolic CHF (congestive heart failure) (HCC) 11/23/2016  . Acute congestive heart failure (HCC)   . Burn of left foot 11/21/2016  . Diabetic peripheral neuropathy (HCC) 11/21/2016  . Diabetic ulcer of right foot associated with type 2 diabetes mellitus, with fat layer exposed (HCC) 11/21/2016  . DDD (degenerative disc disease), lumbar 03/27/2016  . History of deep vein thrombosis (DVT) of lower extremity 03/26/2016  . History of pulmonary embolism 10/01/2015  . Nephropathy, diabetic (HCC) 10/01/2015  . 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 Chronic obstructive pulmonary disease, unspecified (HCC) 12/16/2013  . Medication management  04/14/2013  . History of recurrent DVT/E 04/11/2013  . Sleep apnea 04/11/2013  . Bradycardia 04/11/2013  . History of Elevated CK level  04/11/2013  . Class 1 obesity due to excess calories with serious comorbidity and body mass index (BMI) of 34.0 to 34.9 in adult 04/11/2013  . Peripheral neuropathy (HCC)   . Charcot's arthropathy   . Chronic diastolic heart failure (HCC) 04/08/2013  . Paroxysmal atrial fibrillation (HCC) 04/07/2013  . Reactive depression (situational) 05/16/2011  . Long term (current) use of anticoagulants 09/21/2010  . 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 ED (erectile dysfunction) of organic origin 07/28/2010  . Poorly controlled type 2 diabetes mellitus with peripheral neuropathy (HCC) 05/26/2008  . Hyperlipidemia 05/26/2008  . Hypertensive heart disease 05/26/2008   Makaylee Spielberg, PTA  Tyia Binford A Alamin Mccuiston 12/18/2017, 4:36  PM  [facility] [address] [place], [place], [address] Phone: [phone]   Fax:  [phone]  Name: Bobby Lindsey MRN: [id number] Date of Birth: May 05, 1955

## 2017-12-18 NOTE — Patient Instructions (Signed)
Seated Marches    Sitting in chair, alternating marches bringing knees high and lowering slowly. Repeat __10_ times each side.  Copyright  VHI. All rights reserved.   Isometric Hip Abduction    Place green band around thighs. Spread legs apart, tightening muscles on outside of thigh. Repeat __10__ times. Do __3__ sessions per day.  http://gt2.exer.us/627   Copyright  VHI. All rights reserved.   Strengthening: Hip Adduction - Isometric    With folded pillow or green band between knees, squeeze knees together.  Repeat __10__ times per set. Do __3__ sessions per day.  http://orth.exer.us/612   Copyright  VHI. All rights reserved.   Toe Up    Gently rise up on toes and back on heels. Repeat __10__ times. Do __3Knee Extension (Active / Resistive ROM)    Extend knee so that lower leg is straight. Repeat with other leg. Repeat __10__ times. Do __3__ sessions per day.  http://gt2.exer.us/617   Copyright  VHI. All rights reserved.  __ sessions per day.  http://gt2.exer.us/455   Copyright  VHI. All rights reserved.

## 2017-12-20 ENCOUNTER — Other Ambulatory Visit: Payer: Self-pay | Admitting: Cardiology

## 2017-12-23 ENCOUNTER — Other Ambulatory Visit: Payer: Self-pay | Admitting: Physician Assistant

## 2017-12-23 DIAGNOSIS — I5033 Acute on chronic diastolic (congestive) heart failure: Secondary | ICD-10-CM

## 2017-12-23 DIAGNOSIS — I25810 Atherosclerosis of coronary artery bypass graft(s) without angina pectoris: Secondary | ICD-10-CM

## 2017-12-24 NOTE — Telephone Encounter (Signed)
Rx has been sent to the pharmacy electronically. ° °

## 2017-12-25 ENCOUNTER — Encounter: Payer: Self-pay | Admitting: Physical Therapy

## 2018-01-01 ENCOUNTER — Ambulatory Visit: Payer: Managed Care, Other (non HMO) | Admitting: Physical Therapy

## 2018-01-01 ENCOUNTER — Encounter: Payer: Self-pay | Admitting: Physical Therapy

## 2018-01-01 DIAGNOSIS — R2689 Other abnormalities of gait and mobility: Secondary | ICD-10-CM

## 2018-01-01 DIAGNOSIS — R293 Abnormal posture: Secondary | ICD-10-CM

## 2018-01-01 DIAGNOSIS — R296 Repeated falls: Secondary | ICD-10-CM

## 2018-01-01 DIAGNOSIS — R2681 Unsteadiness on feet: Secondary | ICD-10-CM

## 2018-01-01 DIAGNOSIS — M6281 Muscle weakness (generalized): Secondary | ICD-10-CM | POA: Diagnosis not present

## 2018-01-02 NOTE — Therapy (Signed)
[facility] [address] [place], [place], [address] Phone: [phone]   Fax:  [phone]  Physical Therapy Treatment  Patient Details  Name: Bobby Lindsey MRN: [id number] Date of Birth: May 30, 1955 Referring Provider (PT): Rhina Brackett, MD   Encounter Date: 01/01/2018  PT End of Session - 01/01/18 1800    Visit Number  12   pt aware of self pay after 10 approved visits.    Number of Visits  18    Authorization Type  [employer] Managed Pt has met VL. Wife reports approved for 10 additional visits for now. Wife & pt plan to apply for [place].     Authorization - Visit Number  12   pt aware of self pay after 10 approved visits.   Authorization - Number of Visits  10    PT Start Time  1230    PT Stop Time  1315    PT Time Calculation (min)  45 min    Equipment Utilized During Treatment  Gait belt    Activity Tolerance  Patient tolerated treatment well;Patient limited by pain    Behavior During Therapy  WFL for tasks assessed/performed       Past Medical History:  Diagnosis Date  . Anginal pain ([place])    last pm  . Atrial fibrillation ([place])    a. Transient during 03/2013 admission.  . Bradycardia    a. Bradycardia/pauses/possible Mobitz II during 03/2013 adm. Not on BB due to this.  Marland Kitchen CAD (coronary artery disease)    a. s/p CABG 2002. b. Hx Cypher stent to the RCA. c. Inf-lat STEMI 03/2013:  LHC (04/05/13):  mLAD occluded, pD1 90, apical br of Dx occluded, CFX occluded, pOM1 90-95, RCA stents patent, diff RCA 30, S-Dx occluded, S-PDA occluded, S-OM1 40-50, L-LAD patent, EF 40% with inf HK.  PCI:  Promus (2.5 x 28) DES to mid to dist CFX.  Marland Kitchen Charcot's joint of knee   . COPD (chronic obstructive pulmonary disease) ([place])   . Deep venous thrombosis ([facility])    right lower extremity  . Diabetes mellitus    a. A1C 10.7 in 03/2013.  . Diastolic CHF ([place])    a. EF 40% by cath, 55-60% during 03/2013 adm,  required IV diuresis.  . DVT, lower extremity, recurrent ([place])    a. Hx recurrent DVT per record.  . Dyslipidemia   . Elevated CK    a. Pt has refused rheum workup in the past.  . GERD (gastroesophageal reflux disease)   . History of hiatal hernia   . HTN (hypertension)    x 15 years  . Hx of cardiovascular stress test    a. Lexiscan Myoview (03/2010):  diaph atten vs inf scar, no ischemia, EF 47%; Low Risk.  Marland Kitchen Hx of echocardiogram    a. Echo (04/08/13):  Mild LVH, EF 55-60%, restrictive physiology, severe LAE, mild reduced RVSF, mild RAE.  . Leg pain    ABI 6/16:  R 1.2, L 1.1 - normal  . Myocardial infarction ([place])   . Obesity   . Peripheral neuropathy   . Pulmonary embolism ([place])   . Sleep apnea     Past Surgical History:  Procedure Laterality Date  . CORONARY ANGIOPLASTY WITH STENT PLACEMENT    . CORONARY ARTERY BYPASS GRAFT     4 time since 2002  . LEFT HEART CATHETERIZATION WITH CORONARY/GRAFT ANGIOGRAM  04/05/2013   Procedure: LEFT HEART CATHETERIZATION WITH CORONARY/GRAFT ANGIOGRAM;  Surgeon: Peter M Martinique, MD;  Location: [facility] CATH LAB;  Service: Cardiovascular;;  . left knee surgery    . LOWER EXTREMITY ANGIOGRAPHY Left 12/25/2016   Procedure: LOWER EXTREMITY ANGIOGRAPHY;  Surgeon: Algernon Huxley, MD;  Location: [facility] CV LAB;  Service: Cardiovascular;  Laterality: Left;  . PERCUTANEOUS CORONARY STENT INTERVENTION (PCI-S)  04/05/2013   Procedure: PERCUTANEOUS CORONARY STENT INTERVENTION (PCI-S);  Surgeon: Peter M Martinique, MD;  Location: [facility] CATH LAB;  Service: Cardiovascular;;  DES to native Mid cx  . VASCULAR SURGERY      There were no vitals filed for this visit.  Subjective Assessment - 01/01/18 1230    Subjective  He reports no falls. He continues to have SOB with activity. Patient wants to continue PT services.     Pertinent History  L TTA, DM2, perepheral neuropathy, PVD, CAD, CABG X3, CHF, DVTs, HTN, HLD, A-Fib,     Limitations  Standing;House hold  activities;Walking    How long can you sit comfortably?  as long as he wants to sit    How long can you stand comfortably?  unable    How long can you walk comfortably?  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  Yes    Pain Score  5    with walking, least 2/10 worst 10/10   Pain Location  Back    Pain Orientation  Lower    Pain Descriptors / Indicators  Aching    Pain Type  Chronic pain    Pain Onset  More than a month ago    Pain Frequency  Intermittent    Aggravating Factors   walking & standing     Pain Relieving Factors  sitting down, resting         [facility] PT Assessment - 01/01/18 1230      Berg Balance Test   Sit to Stand  Able to stand  independently using hands    Standing Unsupported  Able to stand 2 minutes with supervision    Sitting with Back Unsupported but Feet Supported on Floor or Stool  Able to sit safely and securely 2 minutes    Stand to Sit  Controls descent by using hands    Transfers  Able to transfer safely, definite need of hands    Standing Unsupported with Eyes Closed  Able to stand 3 seconds    Standing Ubsupported with Feet Together  Able to place feet together independently but unable to hold for 30 seconds    From Standing, Reach Forward with Outstretched Arm  Can reach forward >5 cm safely (2")    From Standing Position, Pick up Object from Floor  Unable to pick up shoe, but reaches 2-5 cm (1-2") from shoe and balances independently    From Standing Position, Turn to Look Behind Over each Shoulder  Turn sideways only but maintains balance    Turn 360 Degrees  Needs close supervision or verbal cueing    Standing Unsupported, Alternately Place Feet on Step/Stool  Able to complete >2 steps/needs minimal assist    Standing Unsupported, One Foot in [employer] balance while stepping or standing    Standing on One Leg  Tries to lift leg/unable to hold 3 seconds  but remains standing independently    Total Score  29    Berg comment:  Initial Merrilee Jansky 9/56  [facility] Adult PT Treatment/Exercise - 01/01/18 1230      Ambulation/Gait   Ambulation/Gait  Yes    Ambulation/Gait Assistance  4: Min guard;4: Min assist    Ambulation/Gait Assistance Details  verbal & tactile /manual cues on upright posture & balance reaction    Ambulation Distance (Feet)  150 Feet   150' X 2   Assistive device  Prosthesis;Straight cane    Gait Pattern  Step-through pattern;Decreased stride length;Decreased stance time - left;Decreased step length - right;Decreased weight shift to left;Antalgic;Narrow base of support;Trunk flexed    Ambulation Surface  Indoor;Level      Exercises   Exercises  Knee/Hip;Ankle      Knee/Hip Exercises: Seated   Long Arc Quad  AROM;Strengthening;Both;1 set;10 reps    Marching  AROM;Strengthening;Both;1 set;10 reps    Abduction/Adduction   AROM;Strengthening;Both;1 set;10 reps;Other (comment)   with GTB     Prosthetics   Prosthetic Care Comments   PT instructed in signs of sweating & need to dry limb/liner. PT removed more of loose dry scab from chronic wound.  Ring shaped rash has decreased in redness and appears to be healing.     Current prosthetic wear tolerance (days/week)   daily    Current prosthetic wear tolerance (#hours/day)   all awake hours, drying as needed    Edema  pitting edema    Residual limb condition   scab over incision is smaller with dry appearance. ring shaped rash is pink with dry skin indicating healing    Education Provided  Skin check;Residual limb care;Prosthetic cleaning;Proper Donning;Proper wear schedule/adjustment;Other (comment)   see prosthetic care comments   Person(s) Educated  Patient    Education Method  Explanation;Verbal cues    Education Method  Verbalized understanding;Verbal cues required;Needs further instruction    Donning Prosthesis  Supervision;Modified independent  (device/increased time)    Doffing Prosthesis  Modified independent (device/increased time)      Ankle Exercises: Seated   Heel Raises  Both;10 reps    Toe Raise  10 reps             PT Education - 01/01/18 1300    Education provided  Yes    Education Details  Use of rollator walker to help back pain, increase distances with walking program, gait on uneven terrain and lower fall risk    Person(s) Educated  Patient    Methods  Explanation;Verbal cues    Comprehension  Verbalized understanding;Need further instruction       PT Short Term Goals - 01/01/18 1800      PT SHORT TERM GOAL #1   Title  Patient verbalizes proper adjustment of ply socks with limb volume changes. (All updated STGs Target Date: 12/07/2017)    Status  Achieved      PT SHORT TERM GOAL #2   Title  Patient reports daily wear of prosthesis >12 hrs without increase in skin issues.     Status  Achieved      PT SHORT TERM GOAL #3   Title  Patient ambulates 100' with cane & prosthesis with minimal assist.     Status  Achieved      PT SHORT TERM GOAL #4   Title  Patient negotiates ramps, curbs with cane & stairs with 1 rail/cane with prosthesis with minimal assist.     Status  Achieved      PT SHORT TERM GOAL #5   Title  Patient performs standing balance with cane support & prosthesis reaching 10"  anteriorly, picking up 1# object from floor & scanning environment with supervision, no balance losses.     Status  Achieved        PT Long Term Goals - 01/01/18 1800      PT LONG TERM GOAL #1   Title  Patient verbalizes & demonstrates proper prosthetic care to enable safe use of prosthesis. (All LTGs Target Date: 01/29/2018)    Baseline       Time  3    Period  Months    Status  On-going    Target Date  01/29/18      PT LONG TERM GOAL #2   Title  Patient tolerates wear of prosthesis >90% of awake hours without skin issues or limb pain >2/10 to enable function throughout his day.     Baseline         Time  3    Period  Months    Status  On-going    Target Date  01/29/18      PT LONG TERM GOAL #3   Title  Berg Balance >36/56 to indicate lower fall risk & less dependency in standing ADLs.     Baseline  01/01/2018 Merrilee Jansky 30/56    Time  3    Period  Months    Status  On-going    Target Date  01/29/18      PT LONG TERM GOAL #4   Title  Patient ambulates 500' outdoors including paved & grass surfaces with rollator walker & prosthesis modified independent to enable community mobility.     Baseline        Time  3    Period  Months    Status  On-going    Target Date  01/29/18      PT LONG TERM GOAL #5   Title  Patient negotiates ramps, curbs & stairs with LRAD & prosthesis modified independent to enable community access.     Baseline        Time  3    Period  Months    Status  On-going    Target Date  01/29/18      PT LONG TERM GOAL #6   Title  Patient ambulates 100' around furniture carrying plate or cup with cane or less & prosthesis modified independent.     Baseline        Time  3    Period  Months    Status  On-going    Target Date  01/29/18            Plan - 01/01/18 1800    Clinical Impression Statement  Patient improved Berg from 15/56 to 30/56 which indicates high fall risk still. Patient verbalizes better understanding of PT recommendation to use rollator walker for distances >100' and to work on increase distance.     Rehab Potential  Good    Clinical Impairments Affecting Rehab Potential  weakness BUEs, BLEs & trunk, impaired balance with high fall risk, dependent gait with prosthesis, dependent in proper prosthetic care & use    PT Frequency  1x / week   2x/wk for 5 weeks, then 1x/wk for 8 weeks   PT Duration  Other (comment)   13 weeks (3 months)   PT Treatment/Interventions  ADLs/Self Care Home Management;[place] Repostioning;DME Instruction;Gait training;Stair training;Functional mobility training;Therapeutic activities;Therapeutic exercise;Balance  training;Patient/family education;Neuromuscular re-education;Prosthetic Training;Manual techniques;Vestibular    PT Next Visit Plan  Continue every other week in December & then recertify in January when  he has more visits. monitor skin/scab of limb, balance & gait with cane, check circular areas on skin under liner.     Consulted and Agree with Plan of Care  Patient       Patient will benefit from skilled therapeutic intervention in order to improve the following deficits and impairments:  Abnormal gait, Decreased activity tolerance, Decreased balance, Decreased coordination, Decreased endurance, Decreased knowledge of use of DME, Decreased mobility, Decreased range of motion, Decreased strength, Dizziness, Impaired flexibility, Prosthetic Dependency, Postural dysfunction, Pain  Visit Diagnosis: Muscle weakness (generalized)  Other abnormalities of gait and mobility  Unsteadiness on feet  Abnormal posture  Repeated falls     Problem List Patient Active Problem List   Diagnosis Date Noted  . SOB (shortness of breath) 12/06/2017  . Uncontrolled type 2 diabetes mellitus with insulin therapy ([place]) 12/06/2017  . Primary osteoarthritis of left knee 11/02/2017  . Open wound 06/05/2017  . Diabetic ulcer of left lower leg ([place]) 05/16/2017  . Acquired absence of left leg below knee ([place]) 02/28/2017  . Impaired mobility and activities of daily living 02/15/2017  . Atherosclerotic heart disease of native coronary artery without angina pectoris 01/05/2017  . HTN (hypertension) 12/19/2016  . Atherosclerosis of native arteries of the extremities with ulceration ([place]) 12/19/2016  . Peripheral vascular disease ([place]) 12/07/2016  . CHF (congestive heart failure) ([place]) 11/24/2016  . Demand ischemia ([place])   . 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 Stage 3 chronic kidney disease due to diabetes mellitus ([place]) 11/23/2016  . NSTEMI (non-ST elevated myocardial infarction) ([place])  11/23/2016  . Acute diastolic CHF (congestive heart failure) ([place]) 11/23/2016  . Acute congestive heart failure ([place])   . Burn of left foot 11/21/2016  . Diabetic peripheral neuropathy ([place]) 11/21/2016  . Diabetic ulcer of right foot associated with type 2 diabetes mellitus, with fat layer exposed ([place]) 11/21/2016  . DDD (degenerative disc disease), lumbar 03/27/2016  . History of deep vein thrombosis (DVT) of lower extremity 03/26/2016  . History of pulmonary embolism 10/01/2015  . Nephropathy, diabetic ([place]) 10/01/2015  . 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 Chronic obstructive pulmonary disease, unspecified ([place]) 12/16/2013  . Medication management 04/14/2013  . History of recurrent DVT/E 04/11/2013  . Sleep apnea 04/11/2013  . Bradycardia 04/11/2013  . History of Elevated CK level  04/11/2013  . Class 1 obesity due to excess calories with serious comorbidity and body mass index (BMI) of 34.0 to 34.9 in adult 04/11/2013  . Peripheral neuropathy ([place])   . Charcot's arthropathy   . Chronic diastolic heart failure ([place]) 04/08/2013  . Paroxysmal atrial fibrillation ([place]) 04/07/2013  . Reactive depression ([place]) 05/16/2011  . Long term (current) use of anticoagulants 09/21/2010  . 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 ED (erectile dysfunction) of organic origin 07/28/2010  . Poorly controlled type 2 diabetes mellitus with peripheral neuropathy ([place]) 05/26/2008  . Hyperlipidemia 05/26/2008  . Hypertensive heart disease 05/26/2008    Jamey Reas PT, DPT 01/02/2018, 7:00 AM  [facility] [address] [place], [place], [address] Phone: [phone]   Fax:  [phone]  Name: Bobby Lindsey MRN: [id number] Date of Birth: 28-Feb-1955

## 2018-01-15 ENCOUNTER — Encounter: Payer: Managed Care, Other (non HMO) | Admitting: Physical Therapy

## 2018-01-16 ENCOUNTER — Ambulatory Visit: Payer: Managed Care, Other (non HMO) | Attending: Physical Medicine & Rehabilitation | Admitting: Physical Therapy

## 2018-01-16 ENCOUNTER — Encounter: Payer: Self-pay | Admitting: Physical Therapy

## 2018-01-16 DIAGNOSIS — R296 Repeated falls: Secondary | ICD-10-CM | POA: Diagnosis present

## 2018-01-16 DIAGNOSIS — R2689 Other abnormalities of gait and mobility: Secondary | ICD-10-CM | POA: Insufficient documentation

## 2018-01-16 DIAGNOSIS — R42 Dizziness and giddiness: Secondary | ICD-10-CM | POA: Diagnosis present

## 2018-01-16 DIAGNOSIS — R293 Abnormal posture: Secondary | ICD-10-CM | POA: Diagnosis present

## 2018-01-16 DIAGNOSIS — R2681 Unsteadiness on feet: Secondary | ICD-10-CM | POA: Diagnosis present

## 2018-01-16 DIAGNOSIS — M6281 Muscle weakness (generalized): Secondary | ICD-10-CM | POA: Insufficient documentation

## 2018-01-16 DIAGNOSIS — R278 Other lack of coordination: Secondary | ICD-10-CM | POA: Diagnosis present

## 2018-01-16 NOTE — Patient Instructions (Signed)
Access Code: [email or web address]  URL: [email or web address]  Date: 01/16/2018  Prepared by: Jamey Reas   Exercises  Standing with Head Rotation - 5 reps - 1 sets - 5 seconds hold - 1x daily - 5x weekly  Standing with Head Nod - 10 reps - 1 sets - 5 seconds hold - 1x daily - 5x weekly  Standing Balance with Eyes Closed - 5 reps - 1 sets - 10-15 seconds hold - 1x daily - 5x weekly

## 2018-01-17 ENCOUNTER — Other Ambulatory Visit: Payer: Self-pay | Admitting: Physician Assistant

## 2018-01-17 DIAGNOSIS — I5033 Acute on chronic diastolic (congestive) heart failure: Secondary | ICD-10-CM

## 2018-01-17 DIAGNOSIS — I25810 Atherosclerosis of coronary artery bypass graft(s) without angina pectoris: Secondary | ICD-10-CM

## 2018-01-17 NOTE — Therapy (Signed)
[facility] [address] [place], [place], [address] Phone: [phone]   Fax:  [phone]  Physical Therapy Treatment  Patient Details  Name: Bobby Lindsey MRN: [id number] Date of Birth: [birth year] Referring Provider (PT): Rhina Brackett, MD   Encounter Date: 01/16/2018  PT End of Session - 01/16/18 1800    Visit Number  13   pt aware of self pay after 10 approved visits.    Number of Visits  18    Authorization Type  [employer] Managed Pt has met VL. Wife reports approved for 10 additional visits for now. Wife & pt plan to apply for [place].     Authorization - Visit Number  13   pt aware of self pay after 10 approved visits.   Authorization - Number of Visits  10    PT Start Time  1407    PT Stop Time  1445    PT Time Calculation (min)  38 min    Equipment Utilized During Treatment  Gait belt    Activity Tolerance  Patient tolerated treatment well;Patient limited by pain    Behavior During Therapy  WFL for tasks assessed/performed       Past Medical History:  Diagnosis Date  . Anginal pain ([place])    last pm  . Atrial fibrillation ([place])    a. Transient during 03/2013 admission.  . Bradycardia    a. Bradycardia/pauses/possible Mobitz II during 03/2013 adm. Not on BB due to this.  Marland Kitchen CAD (coronary artery disease)    a. s/p CABG 2002. b. Hx Cypher stent to the RCA. c. Inf-lat STEMI 03/2013:  LHC (04/05/13):  mLAD occluded, pD1 90, apical br of Dx occluded, CFX occluded, pOM1 90-95, RCA stents patent, diff RCA 30, S-Dx occluded, S-PDA occluded, S-OM1 40-50, L-LAD patent, EF 40% with inf HK.  PCI:  Promus (2.5 x 28) DES to mid to dist CFX.  Marland Kitchen Charcot's joint of knee   . COPD (chronic obstructive pulmonary disease) ([place])   . Deep venous thrombosis ([facility])    right lower extremity  . Diabetes mellitus    a. A1C 10.7 in 03/2013.  . Diastolic CHF ([place])    a. EF 40% by cath, 55-60% during 03/2013 adm,  required IV diuresis.  . DVT, lower extremity, recurrent ([place])    a. Hx recurrent DVT per record.  . Dyslipidemia   . Elevated CK    a. Pt has refused rheum workup in the past.  . GERD (gastroesophageal reflux disease)   . History of hiatal hernia   . HTN (hypertension)    x 15 years  . Hx of cardiovascular stress test    a. Lexiscan Myoview (03/2010):  diaph atten vs inf scar, no ischemia, EF 47%; Low Risk.  Marland Kitchen Hx of echocardiogram    a. Echo (04/08/13):  Mild LVH, EF 55-60%, restrictive physiology, severe LAE, mild reduced RVSF, mild RAE.  . Leg pain    ABI 6/16:  R 1.2, L 1.1 - normal  . Myocardial infarction ([place])   . Obesity   . Peripheral neuropathy   . Pulmonary embolism ([place])   . Sleep apnea     Past Surgical History:  Procedure Laterality Date  . CORONARY ANGIOPLASTY WITH STENT PLACEMENT    . CORONARY ARTERY BYPASS GRAFT     4 time since 2002  . LEFT HEART CATHETERIZATION WITH CORONARY/GRAFT ANGIOGRAM  04/05/2013   Procedure: LEFT HEART CATHETERIZATION WITH CORONARY/GRAFT ANGIOGRAM;  Surgeon: Peter M Martinique, MD;  Location: [facility] CATH LAB;  Service: Cardiovascular;;  . left knee surgery    . LOWER EXTREMITY ANGIOGRAPHY Left 12/25/2016   Procedure: LOWER EXTREMITY ANGIOGRAPHY;  Surgeon: Algernon Huxley, MD;  Location: [facility] CV LAB;  Service: Cardiovascular;  Laterality: Left;  . PERCUTANEOUS CORONARY STENT INTERVENTION (PCI-S)  04/05/2013   Procedure: PERCUTANEOUS CORONARY STENT INTERVENTION (PCI-S);  Surgeon: Peter M Martinique, MD;  Location: [facility] CATH LAB;  Service: Cardiovascular;;  DES to native Mid cx  . VASCULAR SURGERY      There were no vitals filed for this visit.  Subjective Assessment - 01/16/18 1405    Subjective  He reports wearing from arising to bedtime. He has some red spots on leg. No falls.     Pertinent History  L TTA, DM2, perepheral neuropathy, PVD, CAD, CABG X3, CHF, DVTs, HTN, HLD, A-Fib,     Limitations  Standing;House hold activities;Walking    How  long can you sit comfortably?  as long as he wants to sit    How long can you stand comfortably?  unable    How long can you walk comfortably?  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  No/denies    Pain Onset  More than a month ago                       [facility] Adult PT Treatment/Exercise - 01/16/18 1405      Transfers   Transfers  Sit to Stand;Stand to Sit    Sit to Stand  5: Supervision;With upper extremity assist;With armrests;From chair/3-in-1;From elevated surface    Stand to Sit  5: Supervision;With upper extremity assist;With armrests;To elevated surface;To chair/3-in-1      Ambulation/Gait   Ambulation/Gait  Yes    Ambulation/Gait Assistance  4: Min guard;4: Min assist    Ambulation/Gait Assistance Details  visual, tactile & verbal cues on proper step width, upright posture & wt shift over prosthesis.    Ambulation Distance (Feet)  150 Feet   150' X 2   Assistive device  Prosthesis;Straight cane    Gait Pattern  Step-through pattern;Decreased stride length;Decreased stance time - left;Decreased step length - right;Decreased weight shift to left;Antalgic;Narrow base of support;Trunk flexed    Ambulation Surface  Indoor;Level    Stairs  Yes    Stairs Assistance  5: Supervision    Stairs Assistance Details (indicate cue type and reason)  verbal cues on technique with TTA prosthesis & cane    Stair Management Technique  One rail Right;With cane;Step to pattern;Forwards    Number of Stairs  4    Ramp  3: Mod assist   cane & prosthesis   Ramp Details (indicate cue type and reason)  tactile/manual & verbal cues on technique with cane & prosthesis    Curb  3: Mod assist   cane & prosthesis   Curb Details (indicate cue type and reason)  tactile/manual & verbal cues on technique with cane & prosthesis      Self-Care   Lifting  PT demo, instructed in picking up objects from  floor. Pt return demo understanding with RW support & supervision.       Neuro Re-ed    Neuro Re-ed Details   standing in corner with locked rollator in front: standing on floor eyes open head turns/nods & static  with eyes closed.  See pt instructions.      Exercises   Exercises  --      Knee/Hip Exercises: Seated   Long Arc Quad  --    Marching  --    Abduction/Adduction   --      Prosthetics   Prosthetic Care Comments   red rash over entire area under liner with ring appearance. PT recommended consult with his dermatologist. Wound has scab with no signs of infection. PT reviewed need for antiperspirant, sweat management & daily liner cleaning.     Current prosthetic wear tolerance (days/week)   daily    Current prosthetic wear tolerance (#hours/day)   all awake hours, drying as needed    Edema  pitting edema    Residual limb condition   scab over incision is smaller with dry appearance. ring shaped rash is pink with dry skin indicating healing    Education Provided  Skin check;Residual limb care;Prosthetic cleaning;Proper Donning;Proper wear schedule/adjustment;Other (comment)   see prosthetic care comments   Person(s) Educated  Patient    Education Method  Explanation;Demonstration;Tactile cues;Verbal cues    Education Method  Verbalized understanding;Returned demonstration;Tactile cues required;Verbal cues required;Needs further instruction    Donning Prosthesis  Supervision    Doffing Prosthesis  Supervision      Ankle Exercises: Seated   Heel Raises  Both;10 reps    Toe Raise  10 reps               PT Short Term Goals - 01/01/18 1800      PT SHORT TERM GOAL #1   Title  Patient verbalizes proper adjustment of ply socks with limb volume changes. (All updated STGs Target Date: 12/07/2017)    Status  Achieved      PT SHORT TERM GOAL #2   Title  Patient reports daily wear of prosthesis >12 hrs without increase in skin issues.     Status  Achieved      PT SHORT TERM  GOAL #3   Title  Patient ambulates 100' with cane & prosthesis with minimal assist.     Status  Achieved      PT SHORT TERM GOAL #4   Title  Patient negotiates ramps, curbs with cane & stairs with 1 rail/cane with prosthesis with minimal assist.     Status  Achieved      PT SHORT TERM GOAL #5   Title  Patient performs standing balance with cane support & prosthesis reaching 10" anteriorly, picking up 1# object from floor & scanning environment with supervision, no balance losses.     Status  Achieved        PT Long Term Goals - 01/01/18 1800      PT LONG TERM GOAL #1   Title  Patient verbalizes & demonstrates proper prosthetic care to enable safe use of prosthesis. (All LTGs Target Date: 01/29/2018)    Baseline       Time  3    Period  Months    Status  On-going    Target Date  01/29/18      PT LONG TERM GOAL #2   Title  Patient tolerates wear of prosthesis >90% of awake hours without skin issues or limb pain >2/10 to enable function throughout his day.     Baseline        Time  3    Period  Months    Status  On-going    Target Date  01/29/18  PT LONG TERM GOAL #3   Title  Berg Balance >36/56 to indicate lower fall risk & less dependency in standing ADLs.     Baseline  01/01/2018 Merrilee Jansky 30/56    Time  3    Period  Months    Status  On-going    Target Date  01/29/18      PT LONG TERM GOAL #4   Title  Patient ambulates 500' outdoors including paved & grass surfaces with rollator walker & prosthesis modified independent to enable community mobility.     Baseline        Time  3    Period  Months    Status  On-going    Target Date  01/29/18      PT LONG TERM GOAL #5   Title  Patient negotiates ramps, curbs & stairs with LRAD & prosthesis modified independent to enable community access.     Baseline        Time  3    Period  Months    Status  On-going    Target Date  01/29/18      PT LONG TERM GOAL #6   Title  Patient ambulates 100' around furniture carrying  plate or cup with cane or less & prosthesis modified independent.     Baseline        Time  3    Period  Months    Status  On-going    Target Date  01/29/18            Plan - 01/16/18 1821    Clinical Impression Statement  Patient appears to have increased rash on residual limb that maybe heat rash or "ring worm" type rash. PT advised consult with his dermatologist which he verbalized understanding. He reports only cleaning liner every 3-4 days but verbalizes PT recommendations & concerns. Patient appears to understand HEP for balance that PT added today.     Rehab Potential  Good    Clinical Impairments Affecting Rehab Potential  weakness BUEs, BLEs & trunk, impaired balance with high fall risk, dependent gait with prosthesis, dependent in proper prosthetic care & use    PT Frequency  1x / week   2x/wk for 5 weeks, then 1x/wk for 8 weeks   PT Duration  Other (comment)   13 weeks (3 months)   PT Treatment/Interventions  ADLs/Self Care Home Management;Canalith Repostioning;DME Instruction;Gait training;Stair training;Functional mobility training;Therapeutic activities;Therapeutic exercise;Balance training;Patient/family education;Neuromuscular re-education;Prosthetic Training;Manual techniques;Vestibular    PT Next Visit Plan  check LTGs, pt requesting renewal in 2020 when insurance visit limit resets. check rash & see if he saw dermatologist.    Consulted and Agree with Plan of Care  Patient       Patient will benefit from skilled therapeutic intervention in order to improve the following deficits and impairments:  Abnormal gait, Decreased activity tolerance, Decreased balance, Decreased coordination, Decreased endurance, Decreased knowledge of use of DME, Decreased mobility, Decreased range of motion, Decreased strength, Dizziness, Impaired flexibility, Prosthetic Dependency, Postural dysfunction, Pain  Visit Diagnosis: Muscle weakness (generalized)  Other abnormalities of gait and  mobility  Unsteadiness on feet  Abnormal posture     Problem List Patient Active Problem List   Diagnosis Date Noted  . SOB (shortness of breath) 12/06/2017  . Uncontrolled type 2 diabetes mellitus with insulin therapy ([place]) 12/06/2017  . Primary osteoarthritis of left knee 11/02/2017  . Open wound 06/05/2017  . Diabetic ulcer of left lower leg ([place]) 05/16/2017  .  Acquired absence of left leg below knee ([place]) 02/28/2017  . Impaired mobility and activities of daily living 02/15/2017  . Atherosclerotic heart disease of native coronary artery without angina pectoris 01/05/2017  . HTN (hypertension) 12/19/2016  . Atherosclerosis of native arteries of the extremities with ulceration ([place]) 12/19/2016  . Peripheral vascular disease ([place]) 12/07/2016  . CHF (congestive heart failure) ([place]) 11/24/2016  . Demand ischemia ([place])   . 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 Stage 3 chronic kidney disease due to diabetes mellitus ([place]) 11/23/2016  . NSTEMI (non-ST elevated myocardial infarction) ([place]) 11/23/2016  . Acute diastolic CHF (congestive heart failure) ([place]) 11/23/2016  . Acute congestive heart failure ([place])   . Burn of left foot 11/21/2016  . Diabetic peripheral neuropathy ([place]) 11/21/2016  . Diabetic ulcer of right foot associated with type 2 diabetes mellitus, with fat layer exposed ([place]) 11/21/2016  . DDD (degenerative disc disease), lumbar 03/27/2016  . History of deep vein thrombosis (DVT) of lower extremity 03/26/2016  . History of pulmonary embolism 10/01/2015  . Nephropathy, diabetic ([place]) 10/01/2015  . 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 Chronic obstructive pulmonary disease, unspecified ([place]) 12/16/2013  . Medication management 04/14/2013  . History of recurrent DVT/E 04/11/2013  . Sleep apnea 04/11/2013  . Bradycardia 04/11/2013  . History of Elevated CK level  04/11/2013  . Class 1 obesity due to excess calories with serious  comorbidity and body mass index (BMI) of 34.0 to 34.9 in adult 04/11/2013  . Peripheral neuropathy ([place])   . Charcot's arthropathy   . Chronic diastolic heart failure ([place]) 04/08/2013  . Paroxysmal atrial fibrillation ([place]) 04/07/2013  . Reactive depression (sit[place]) 05/16/2011  . Long term (current) use of anticoagulants 09/21/2010  . 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 ED (erectile dysfunction) of organic origin 07/28/2010  . Poorly controlled type 2 diabetes mellitus with peripheral neuropathy ([place]) 05/26/2008  . Hyperlipidemia 05/26/2008  . Hypertensive heart disease 05/26/2008    Jamey Reas PT, DPT 01/17/2018, 6:25 PM  [facility] [address] [place], [place], [address] Phone: [phone]   Fax:  [phone]  Name: Bobby Lindsey MRN: [id number] Date of Birth: 1955/04/04

## 2018-01-26 ENCOUNTER — Other Ambulatory Visit: Payer: Self-pay | Admitting: Physician Assistant

## 2018-01-29 ENCOUNTER — Ambulatory Visit: Payer: Managed Care, Other (non HMO) | Admitting: Physical Therapy

## 2018-01-31 ENCOUNTER — Encounter: Payer: Self-pay | Admitting: Physical Therapy

## 2018-01-31 ENCOUNTER — Ambulatory Visit: Payer: Managed Care, Other (non HMO) | Admitting: Physical Therapy

## 2018-01-31 DIAGNOSIS — R278 Other lack of coordination: Secondary | ICD-10-CM

## 2018-01-31 DIAGNOSIS — R2681 Unsteadiness on feet: Secondary | ICD-10-CM

## 2018-01-31 DIAGNOSIS — M6281 Muscle weakness (generalized): Secondary | ICD-10-CM | POA: Diagnosis not present

## 2018-01-31 DIAGNOSIS — R296 Repeated falls: Secondary | ICD-10-CM

## 2018-01-31 DIAGNOSIS — R2689 Other abnormalities of gait and mobility: Secondary | ICD-10-CM

## 2018-01-31 DIAGNOSIS — R42 Dizziness and giddiness: Secondary | ICD-10-CM

## 2018-01-31 DIAGNOSIS — R293 Abnormal posture: Secondary | ICD-10-CM

## 2018-01-31 NOTE — Therapy (Signed)
[facility] [address] [place], [place], [address] Phone: [phone]   Fax:  [phone]  Physical Therapy Treatment  Patient Details  Name: Bobby Lindsey MRN: [id number] Date of Birth: 03-12-1955 Referring Provider (PT): Rhina Brackett, MD   Encounter Date: 01/31/2018  PT End of Session - 01/31/18 1223    Visit Number  14   pt aware of self pay after 10 approved visits.    Number of Visits  30    Authorization Type  [employer] Managed Pt has met VL. Wife reports approved for 10 additional visits for now. Wife & pt plan to apply for [place].     Authorization Time Period  visit limit should reset in 2020 & he wants to continue PT to work on balance & gait with prosthesis    Authorization - Visit Number  14   pt aware of self pay after 10 approved visits.   Authorization - Number of Visits  10    PT Start Time  [phone]    PT Stop Time  0927    PT Time Calculation (min)  38 min    Equipment Utilized During Treatment  Gait belt    Activity Tolerance  Patient tolerated treatment well;Patient limited by pain    Behavior During Therapy  WFL for tasks assessed/performed       Past Medical History:  Diagnosis Date  . Anginal pain ([place])    last pm  . Atrial fibrillation ([place])    a. Transient during 03/2013 admission.  . Bradycardia    a. Bradycardia/pauses/possible Mobitz II during 03/2013 adm. Not on BB due to this.  Marland Kitchen CAD (coronary artery disease)    a. s/p CABG 2002. b. Hx Cypher stent to the RCA. c. Inf-lat STEMI 03/2013:  LHC (04/05/13):  mLAD occluded, pD1 90, apical br of Dx occluded, CFX occluded, pOM1 90-95, RCA stents patent, diff RCA 30, S-Dx occluded, S-PDA occluded, S-OM1 40-50, L-LAD patent, EF 40% with inf HK.  PCI:  Promus (2.5 x 28) DES to mid to dist CFX.  Marland Kitchen Charcot's joint of knee   . COPD (chronic obstructive pulmonary disease) ([place])   . Deep venous thrombosis (HCC)    right lower  extremity  . Diabetes mellitus    a. A1C 10.7 in 03/2013.  . Diastolic CHF ([place])    a. EF 40% by cath, 55-60% during 03/2013 adm, required IV diuresis.  . DVT, lower extremity, recurrent ([place])    a. Hx recurrent DVT per record.  . Dyslipidemia   . Elevated CK    a. Pt has refused rheum workup in the past.  . GERD (gastroesophageal reflux disease)   . History of hiatal hernia   . HTN (hypertension)    x 15 years  . Hx of cardiovascular stress test    a. Lexiscan Myoview (03/2010):  diaph atten vs inf scar, no ischemia, EF 47%; Low Risk.  Marland Kitchen Hx of echocardiogram    a. Echo (04/08/13):  Mild LVH, EF 55-60%, restrictive physiology, severe LAE, mild reduced RVSF, mild RAE.  . Leg pain    ABI 6/16:  R 1.2, L 1.1 - normal  . Myocardial infarction ([place])   . Obesity   . Peripheral neuropathy   . Pulmonary embolism ([place])   . Sleep apnea     Past Surgical History:  Procedure Laterality Date  . CORONARY ANGIOPLASTY WITH STENT PLACEMENT    . CORONARY ARTERY BYPASS GRAFT  4 time since 2002  . LEFT HEART CATHETERIZATION WITH CORONARY/GRAFT ANGIOGRAM  04/05/2013   Procedure: LEFT HEART CATHETERIZATION WITH Beatrix Fetters;  Surgeon: Peter M Martinique, MD;  Location: [facility] CATH LAB;  Service: Cardiovascular;;  . left knee surgery    . LOWER EXTREMITY ANGIOGRAPHY Left 12/25/2016   Procedure: LOWER EXTREMITY ANGIOGRAPHY;  Surgeon: Algernon Huxley, MD;  Location: [facility] CV LAB;  Service: Cardiovascular;  Laterality: Left;  . PERCUTANEOUS CORONARY STENT INTERVENTION (PCI-S)  04/05/2013   Procedure: PERCUTANEOUS CORONARY STENT INTERVENTION (PCI-S);  Surgeon: Peter M Martinique, MD;  Location: [facility] CATH LAB;  Service: Cardiovascular;;  DES to native [place] cx  . VASCULAR SURGERY      There were no vitals filed for this visit.  Subjective Assessment - 01/31/18 0850    Subjective  No falls. He is still awake hours. He saw dermatologist who did not think rash was ringworm. Rash is better. He has  following PT recommendations for cleaning & drying limb/liner.     Pertinent History  L TTA, DM2, perepheral neuropathy, PVD, CAD, CABG X3, CHF, DVTs, HTN, HLD, A-Fib,     Limitations  Standing;House hold activities;Walking    How long can you sit comfortably?  as long as he wants to sit    How long can you stand comfortably?  unable    How long can you walk comfortably?  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  No/denies    Pain Onset  More than a month ago         [facility] PT Assessment - 01/31/18 0850      Posture/Postural Control   Posture/Postural Control  Postural limitations    Postural Limitations  Rounded Shoulders;Forward head;Flexed trunk;Weight shift right      Transfers   Transfers  Sit to Stand;Stand to Sit    Sit to Stand  5: Supervision;With upper extremity assist;With armrests;From chair/3-in-1   able to stabilize without UE support   Stand to Sit  5: Supervision;With upper extremity assist;With armrests;To chair/3-in-1      Ambulation/Gait   Ambulation/Gait  Yes    Ambulation/Gait Assistance  5: Supervision   cane due to instability & rollator cues for deviations   Ambulation/Gait Assistance Details  Pt ambulated 100' around furniture with cane with close supervision. With rollator walker, he ambulates without assistance but verbal cues to correct posture & position within rollator walker.     Ambulation Distance (Feet)  500 Feet   500' rollator & 100' cane   Assistive device  Rollator;Straight cane;Prosthesis    Gait Pattern  Step-through pattern;Decreased arm swing - left;Decreased step length - right;Decreased stance time - left;Decreased stride length;Decreased hip/knee flexion - left;Decreased weight shift to left;Left flexed knee in stance;Antalgic;Trunk flexed;Abducted - left    Ambulation Surface  Indoor;Level    Gait velocity  0.77 ft/sec cane & 1.02 ft/sec  rollator    Stairs  Yes    Stairs Assistance  6: Modified independent (Device/Increase time)    Stair Management Technique  Two rails;Step to pattern;Forwards    Number of Stairs  4    Height of Stairs  6    Ramp  5: Supervision;3: Mod assist   ModA cane & supervision (cues) rollator   Curb  3: Mod assist;5: Supervision   ModA cane & supervision (cues) rollator     [employer]  Sit to Stand  Able to stand  independently using hands    Standing Unsupported  Needs several tries to stand 30 seconds unsupported    Sitting with Back Unsupported but Feet Supported on Floor or Stool  Able to sit safely and securely 2 minutes    Stand to Sit  Controls descent by using hands    Transfers  Able to transfer safely, definite need of hands    Standing Unsupported with Eyes Closed  Able to stand 3 seconds    Standing Ubsupported with Feet Together  Able to place feet together independently but unable to hold for 30 seconds    From Standing, Reach Forward with Outstretched Arm  Can reach forward >5 cm safely (2")    From Standing Position, Pick up Object from Floor  Able to pick up shoe, needs supervision    From Standing Position, Turn to Look Behind Over each Shoulder  Turn sideways only but maintains balance    Turn 360 Degrees  Needs close supervision or verbal cueing    Standing Unsupported, Alternately Place Feet on Step/Stool  Able to complete >2 steps/needs minimal assist    Standing Unsupported, One Foot in Front  Needs help to step but can hold 15 seconds    Standing on One Leg  Tries to lift leg/unable to hold 3 seconds but remains standing independently    Total Score  29    Berg commentLinus Orn 9/56      Prosthetics Assessment - 01/31/18 [place] with  Prosthetic cleaning;Proper wear schedule/adjustment    Prosthetic Care Dependent with  Skin check;Residual limb care;Correct ply sock adjustment;Proper weight-bearing  schedule/adjustment    Prosthetic Care Comments   rash appears to be clearing. PT reminded patient that cleaning liner daily, using antiperspirant and drying limb/liner properly will help skin / rash.     Donning prosthesis   Modified independent (Device/Increase time)    Doffing prosthesis   Modified independent (Device/Increase time)    Current prosthetic wear tolerance (days/week)   daily    Current prosthetic wear tolerance (#hours/day)   most of awake hours    Current prosthetic weight-bearing tolerance (hours/day)   Patient tolerates standing & gait with no limb pain. However he experience back pain with gait >100' with cane or less.     Residual limb condition   scab is dry & appears to be healing. light pink rash under area of liner improve from red, irritated appearance 2 weeks ago.                           PT Education - 01/31/18 0915    Education provided  Yes    Education Details  using shopping cart to enter, shop & exit buildings to limit loading/unloading rollator  or using cane which increases back pain    Person(s) Educated  Patient    Methods  Explanation    Comprehension  Verbalized understanding       PT Short Term Goals - 01/31/18 1258      PT SHORT TERM GOAL #1   Title  Patient verbalizes proper skin management for residual limb with use of prosthesis. (All updated STGs Target Date: 03/15/2018)    Time  1    Period  Months    Status  New    Target Date  03/15/18      PT  SHORT TERM GOAL #2   Title  Patient reports back pain increases <4 increments on 0-10 scale with gait & standing activities.     Time  1    Period  Months    Status  New    Target Date  03/15/18      PT SHORT TERM GOAL #3   Title  Patient ambulates 7' with cane & prosthesis scanning environment with supervision.     Time  1    Period  Months    Status  New    Target Date  03/15/18      PT SHORT TERM GOAL #4   Title  Patient negotiates ramps, curbs with cane &  stairs with 1 rail/cane with prosthesis with minimal assist.     Time  1    Period  Months    Status  Revised    Target Date  03/15/18      PT SHORT TERM GOAL #5   Title  Patient able to stand 2 minutes without UE support safely.     Time  1    Period  Months    Status  New    Target Date  03/15/18        PT Long Term Goals - 01/31/18 1249      PT LONG TERM GOAL #1   Title  Patient verbalizes & demonstrates proper prosthetic care to enable safe use of prosthesis. (All LTGs Target Date: 01/29/2018)    Baseline  Partially MET 01/31/2018 Patient verbalizes general understanding of prosthetic care but needs instruction to problem solve new issues like rash on his limb.     Time  3    Period  Months    Status  Partially Met      PT LONG TERM GOAL #2   Title  Patient tolerates wear of prosthesis >90% of awake hours without skin issues or limb pain >2/10 to enable function throughout his day.     Baseline  Partially MET 01/31/2018 Patient tolerates wear >90% of awake hours with scab present prior to PT evaulation but appears to be healing, light rash from heat & no limb pain.     Time  3    Period  Months    Status  Partially Met      PT LONG TERM GOAL #3   Title  Berg Balance >36/56 to indicate lower fall risk & less dependency in standing ADLs.     Baseline  NOT MET 01/31/2018 Berg Balance improved from 9/56 at Initial Eval to 29/56 today    Time  3    Period  Months    Status  Not Met      PT LONG TERM GOAL #4   Title  Patient ambulates 500' outdoors including paved & grass surfaces with rollator walker & prosthesis modified independent to enable community mobility.     Baseline  Partially MET 01/31/2018 He needs general cues for improved technique    Time  3    Period  Months    Status  Partially Met      PT LONG TERM GOAL #5   Title  Patient negotiates ramps, curbs & stairs with LRAD & prosthesis modified independent to enable community access.     Baseline  Partially  MET 01/31/2018 with rollator walker cues/supervision but requires modA with cane    Time  3    Period  Months    Status  Partially Met  PT LONG TERM GOAL #6   Title  Patient ambulates 100' around furniture carrying plate or cup with cane or less & prosthesis modified independent.     Baseline  NOT MET 01/31/2018 Pt ambulates 100' around furniture with cane & prosthesis with close supervision due to unsteadiness.     Time  3    Period  Months    Status  Not Met        PT Long Term Goals - 01/31/18 1306      PT LONG TERM GOAL #1   Title  Patient verbalizes proper prosthetic care including problem solving new issues and tolerates wear >90% of awake hours without issues. (All updated LTGs Target Date: 04/12/2018)    Time  2    Period  Months    Status  Revised    Target Date  04/12/18      PT LONG TERM GOAL #2   Title  Patient reports back pain increases </= 2 increments on 0-10 scale with standing & gait activities and verbalizes proper use of assistive devices to maximize function without significant pain increases.     Time  2    Period  Months    Status  New    Target Date  04/12/18      PT LONG TERM GOAL #3   Title  Berg Balance >36/56 to indicate lower fall risk & less dependency in standing ADLs.     Time  3    Period  Months    Status  On-going    Target Date  04/12/18      PT LONG TERM GOAL #4   Title  Patient ambulates 200' including paved surfaces with cane & prosthesis modified indepenent.     Time  2    Period  Months    Status  Revised    Target Date  04/12/18      PT LONG TERM GOAL #5   Title  Patient negotiates ramps, curbs & stairs 1 rail with cane & prosthesis modified independent to enable community access.     Time  2    Period  Months    Status  Revised    Target Date  04/12/18            Plan - 01/31/18 1226    Clinical Impression Statement  Patient partially met 4 of 6 LTGs. His visits were limited by fact he used initial visit limit  pre-prosthetically. So PT & patient used limited visits to optimize outcomes with limited number. He has potential to meet LTGs with cane at basic community level with prosthesis and improve balance with lower fall risk.     Rehab Potential  Good    Clinical Impairments Affecting Rehab Potential  weakness BUEs, BLEs & trunk, impaired balance with high fall risk, dependent gait with prosthesis, dependent in proper prosthetic care & use    PT Frequency  2x / week    PT Duration  8 weeks    PT Treatment/Interventions  ADLs/Self Care Home Management;Canalith Repostioning;DME Instruction;Gait training;Stair training;Functional mobility training;Therapeutic activities;Therapeutic exercise;Balance training;Patient/family education;Neuromuscular re-education;Prosthetic Training;Manual techniques;Vestibular    PT Next Visit Plan  pt requesting renewal in 2020 when insurance visit limit resets. Vestibular assessment for dizziness, balance activities.    Consulted and Agree with Plan of Care  Patient       Patient will benefit from skilled therapeutic intervention in order to improve the following deficits and impairments:  Abnormal gait, Decreased activity  tolerance, Decreased balance, Decreased coordination, Decreased endurance, Decreased knowledge of use of DME, Decreased mobility, Decreased range of motion, Decreased strength, Dizziness, Impaired flexibility, Prosthetic Dependency, Postural dysfunction, Pain, Decreased skin integrity  Visit Diagnosis: Muscle weakness (generalized)  Other abnormalities of gait and mobility  Unsteadiness on feet  Abnormal posture  Dizziness and giddiness  Other lack of coordination  Repeated falls     Problem List Patient Active Problem List   Diagnosis Date Noted  . SOB (shortness of breath) 12/06/2017  . Uncontrolled type 2 diabetes mellitus with insulin therapy ([place]) 12/06/2017  . Primary osteoarthritis of left knee 11/02/2017  . Open wound 06/05/2017   . Diabetic ulcer of left lower leg ([place]) 05/16/2017  . Acquired absence of left leg below knee ([place]) 02/28/2017  . Impaired mobility and activities of daily living 02/15/2017  . Atherosclerotic heart disease of native coronary artery without angina pectoris 01/05/2017  . HTN (hypertension) 12/19/2016  . Atherosclerosis of native arteries of the extremities with ulceration ([place]) 12/19/2016  . Peripheral vascular disease ([place]) 12/07/2016  . CHF (congestive heart failure) ([place]) 11/24/2016  . Demand ischemia ([place])   . 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 Stage 3 chronic kidney disease due to diabetes mellitus ([place]) 11/23/2016  . NSTEMI (non-ST elevated myocardial infarction) ([place]) 11/23/2016  . Acute diastolic CHF (congestive heart failure) ([place]) 11/23/2016  . Acute congestive heart failure ([place])   . Burn of left foot 11/21/2016  . Diabetic peripheral neuropathy ([place]) 11/21/2016  . Diabetic ulcer of right foot associated with type 2 diabetes mellitus, with fat layer exposed ([place]) 11/21/2016  . DDD (degenerative disc disease), lumbar 03/27/2016  . History of deep vein thrombosis (DVT) of lower extremity 03/26/2016  . History of pulmonary embolism 10/01/2015  . Nephropathy, diabetic ([place]) 10/01/2015  . 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 Chronic obstructive pulmonary disease, unspecified ([place]) 12/16/2013  . Medication management 04/14/2013  . History of recurrent DVT/E 04/11/2013  . Sleep apnea 04/11/2013  . Bradycardia 04/11/2013  . History of Elevated CK level  04/11/2013  . Class 1 obesity due to excess calories with serious comorbidity and body mass index (BMI) of 34.0 to 34.9 in adult 04/11/2013  . Peripheral neuropathy ([place])   . Charcot's arthropathy   . Chronic diastolic heart failure ([place]) 04/08/2013  . Paroxysmal atrial fibrillation ([place]) 04/07/2013  . Reactive depression (situational) 05/16/2011  . Long term (current) use of  anticoagulants 09/21/2010  . 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 ED (erectile dysfunction) of organic origin 07/28/2010  . Poorly controlled type 2 diabetes mellitus with peripheral neuropathy ([place]) 05/26/2008  . Hyperlipidemia 05/26/2008  . Hypertensive heart disease 05/26/2008    Jamey Reas  PT, DPT 01/31/2018, 1:03 PM  [facility] [address] [place], [place], [address] Phone: [phone]   Fax:  [phone]  Name: CHADRICK SPRINKLE MRN: [id number] Date of Birth: 09-May-1955

## 2018-02-02 ENCOUNTER — Other Ambulatory Visit: Payer: Self-pay | Admitting: Physician Assistant

## 2018-02-02 DIAGNOSIS — I5033 Acute on chronic diastolic (congestive) heart failure: Secondary | ICD-10-CM

## 2018-02-14 ENCOUNTER — Ambulatory Visit: Payer: Managed Care, Other (non HMO) | Attending: Physical Medicine & Rehabilitation | Admitting: Physical Therapy

## 2018-02-14 VITALS — BP 121/72 | HR 69

## 2018-02-14 DIAGNOSIS — R2689 Other abnormalities of gait and mobility: Secondary | ICD-10-CM | POA: Diagnosis present

## 2018-02-14 DIAGNOSIS — R293 Abnormal posture: Secondary | ICD-10-CM | POA: Diagnosis present

## 2018-02-14 DIAGNOSIS — R296 Repeated falls: Secondary | ICD-10-CM | POA: Diagnosis present

## 2018-02-14 DIAGNOSIS — R2681 Unsteadiness on feet: Secondary | ICD-10-CM | POA: Diagnosis present

## 2018-02-14 DIAGNOSIS — M6281 Muscle weakness (generalized): Secondary | ICD-10-CM | POA: Diagnosis not present

## 2018-02-14 DIAGNOSIS — R42 Dizziness and giddiness: Secondary | ICD-10-CM | POA: Diagnosis present

## 2018-02-14 DIAGNOSIS — R262 Difficulty in walking, not elsewhere classified: Secondary | ICD-10-CM | POA: Insufficient documentation

## 2018-02-14 NOTE — Therapy (Signed)
[facility] [address] [place], [place], [address] Phone: [phone]   Fax:  [phone]  Physical Therapy Treatment  Patient Details  Name: Bobby Lindsey MRN: [id number] Date of Birth: 08/27/1955 Referring Provider (PT): Rhina Brackett, MD   Encounter Date: 02/14/2018  PT End of Session - 02/14/18 1201    Visit Number  15   pt aware of self pay after 10 approved visits.    Number of Visits  30    Authorization Type  [employer] Managed Pt has met VL. Wife reports approved for 10 additional visits for now. Wife & pt plan to apply for [place].     Authorization Time Period  visit limit should reset in 2020 & he wants to continue PT to work on balance & gait with prosthesis    Authorization - Visit Number  1   visits start over; pt aware of self pay after 10 approved visits   Authorization - Number of Visits  20    PT Start Time  1103    PT Stop Time  1148    PT Time Calculation (min)  45 min    Equipment Utilized During Treatment  --    Activity Tolerance  Patient tolerated treatment well    Behavior During Therapy  WFL for tasks assessed/performed       Past Medical History:  Diagnosis Date  . Anginal pain ([place])    last pm  . Atrial fibrillation ([place])    a. Transient during 03/2013 admission.  . Bradycardia    a. Bradycardia/pauses/possible Mobitz II during 03/2013 adm. Not on BB due to this.  Marland Kitchen CAD (coronary artery disease)    a. s/p CABG 2002. b. Hx Cypher stent to the RCA. c. Inf-lat STEMI 03/2013:  LHC (04/05/13):  mLAD occluded, pD1 90, apical br of Dx occluded, CFX occluded, pOM1 90-95, RCA stents patent, diff RCA 30, S-Dx occluded, S-PDA occluded, S-OM1 40-50, L-LAD patent, EF 40% with inf HK.  PCI:  Promus (2.5 x 28) DES to mid to dist CFX.  Marland Kitchen Charcot's joint of knee   . COPD (chronic obstructive pulmonary disease) ([place])   . Deep venous thrombosis (HCC)    right lower extremity  .  Diabetes mellitus    a. A1C 10.7 in 03/2013.  . Diastolic CHF ([place])    a. EF 40% by cath, 55-60% during 03/2013 adm, required IV diuresis.  . DVT, lower extremity, recurrent ([place])    a. Hx recurrent DVT per record.  . Dyslipidemia   . Elevated CK    a. Pt has refused rheum workup in the past.  . GERD (gastroesophageal reflux disease)   . History of hiatal hernia   . HTN (hypertension)    x 15 years  . Hx of cardiovascular stress test    a. Lexiscan Myoview (03/2010):  diaph atten vs inf scar, no ischemia, EF 47%; Low Risk.  Marland Kitchen Hx of echocardiogram    a. Echo (04/08/13):  Mild LVH, EF 55-60%, restrictive physiology, severe LAE, mild reduced RVSF, mild RAE.  . Leg pain    ABI 6/16:  R 1.2, L 1.1 - normal  . Myocardial infarction ([place])   . Obesity   . Peripheral neuropathy   . Pulmonary embolism ([place])   . Sleep apnea     Past Surgical History:  Procedure Laterality Date  . CORONARY ANGIOPLASTY WITH STENT PLACEMENT    . CORONARY ARTERY BYPASS GRAFT  4 time since 2002  . LEFT HEART CATHETERIZATION WITH CORONARY/GRAFT ANGIOGRAM  04/05/2013   Procedure: LEFT HEART CATHETERIZATION WITH Beatrix Fetters;  Surgeon: Peter M Martinique, MD;  Location: [facility] C[facility] LAB;  Service: Cardiovascular;;  . left knee surgery    . LOWER EXTREMITY ANGIOGRAPHY Left 12/25/2016   Procedure: LOWER EXTREMITY ANGIOGRAPHY;  Surgeon: Algernon Huxley, MD;  Location: [facility] CV LAB;  Service: Cardiovascular;  Laterality: Left;  . PERCUTANEOUS CORONARY STENT INTERVENTION (PCI-S)  04/05/2013   Procedure: PERCUTANEOUS CORONARY STENT INTERVENTION (PCI-S);  Surgeon: Peter M Martinique, MD;  Location: [facility] CATH LAB;  Service: Cardiovascular;;  DES to native Mid cx  . VASCULAR SURGERY      Vitals:   02/14/18 1131 02/14/18 1134  BP: (!) 137/58 121/72  Pulse: 68 69    Subjective Assessment - 02/14/18 1115    Subjective  Rash has cleared up mostly.  No new skin issues.  No falls.  Asking if insurance was verified for  2020 visits; would like to continue to progress with therapy.    Pertinent History  L TTA, DM2, perepheral neuropathy, PVD, CAD, CABG X3, CHF, DVTs, HTN, HLD, A-Fib,     Limitations  Standing;House hold activities;Walking    How long can you sit comfortably?  as long as he wants to sit    How long can you stand comfortably?  unable    How long can you walk comfortably?  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  No/denies    Pain Onset  More than a month ago             Vestibular Assessment - 02/14/18 1116      Vestibular Assessment   General Observation  Pt reports feeling "swimmy headed".  Begins after he has been up and moving around for a while.  Denies any feelings of spinning.  Does feel sleepy when he feels dizzy.  Denies nausea, tinnitus, changes in vision.  Reports hearing has been getting worse gradually.        Symptom Behavior   Type of Dizziness  Comment   swimmy headed   Frequency of Dizziness  intermittent    Duration of Dizziness  hours    Aggravating Factors  No known aggravating factors;Spontaneous onset    Relieving Factors  No known relieving factors      Occulomotor Exam   Occulomotor Alignment  Normal    Spontaneous  Absent    Gaze-induced  Absent    Smooth Pursuits  Intact    Saccades  Intact    Comment  convergence impaired but pt wears reading glasses      Vestibulo-Occular Reflex   VOR to Slow Head Movement  Normal    VOR Cancellation  Normal    Comment  HIT: + to L, - to R      Positional Testing   Dix-Hallpike  Dix-Hallpike Right;Dix-Hallpike Left      Dix-Hallpike Right   Dix-Hallpike Right Duration  0    Dix-Hallpike Right Symptoms  No nystagmus      Dix-Hallpike Left   Dix-Hallpike Left Duration  0    Dix-Hallpike Left Symptoms  No nystagmus      Orthostatics   BP sitting  137/58    BP standing (after 1 minute)  121/78  PT Education - 02/14/18 1150    Education provided  Yes    Education Details  hypofunction as possible cause for dizziness; monitor CBG and BP for 5 days to see if this correlates to feeling dizzy    Person(s) Educated  Patient    Methods  Explanation    Comprehension  Verbalized understanding       PT Short Term Goals - 01/31/18 1258      PT SHORT TERM GOAL #1   Title  Patient verbalizes proper skin management for residual limb with use of prosthesis. (All updated STGs Target Date: 03/15/2018)    Time  1    Period  Months    Status  New    Target Date  03/15/18      PT SHORT TERM GOAL #2   Title  Patient reports back pain increases <4 increments on 0-10 scale with gait & standing activities.     Time  1    Period  Months    Status  New    Target Date  03/15/18      PT SHORT TERM GOAL #3   Title  Patient ambulates 130' with cane & prosthesis scanning environment with supervision.     Time  1    Period  Months    Status  New    Target Date  03/15/18      PT SHORT TERM GOAL #4   Title  Patient negotiates ramps, curbs with cane & stairs with 1 rail/cane with prosthesis with minimal assist.     Time  1    Period  Months    Status  Revised    Target Date  03/15/18      PT SHORT TERM GOAL #5   Title  Patient able to stand 2 minutes without UE support safely.     Time  1    Period  Months    Status  New    Target Date  03/15/18        PT Long Term Goals - 01/31/18 1306      PT LONG TERM GOAL #1   Title  Patient verbalizes proper prosthetic care including problem solving new issues and tolerates wear >90% of awake hours without issues. (All updated LTGs Target Date: 04/12/2018)    Time  2    Period  Months    Status  Revised    Target Date  04/12/18      PT LONG TERM GOAL #2   Title  Patient reports back pain increases </= 2 increments on 0-10 scale with standing & gait activities and verbalizes proper use of assistive devices to  maximize function without significant pain increases.     Time  2    Period  Months    Status  New    Target Date  04/12/18      PT LONG TERM GOAL #3   Title  Berg Balance >36/56 to indicate lower fall risk & less dependency in standing ADLs.     Time  3    Period  Months    Status  On-going    Target Date  04/12/18      PT LONG TERM GOAL #4   Title  Patient ambulates 200' including paved surfaces with cane & prosthesis modified indepenent.     Time  2    Period  Months    Status  Revised    Target Date  04/12/18        PT LONG TERM GOAL #5   Title  Patient negotiates ramps, curbs & stairs 1 rail with cane & prosthesis modified independent to enable community access.     Time  2    Period  Months    Status  Revised    Target Date  04/12/18            Plan - 02/14/18 1202    Clinical Impression Statement  Performed vestibular assessment due to pt reporting dizziness with daily activity/movement.  Pt does report he is inconsistent with taking his medication and that the dizziness may be related to his blood sugar or BP.  Advised pt to log when he feels dizzy and to also log BP/CBG to determine if there is a correlation.  Pt did demonstrate compensatory saccade with HIT to L which may indicate a hypofunction.  Will continue to address at next visit.    Rehab Potential  Good    Clinical Impairments Affecting Rehab Potential  weakness BUEs, BLEs & trunk, impaired balance with high fall risk, dependent gait with prosthesis, dependent in proper prosthetic care & use    PT Frequency  2x / week    PT Duration  8 weeks    PT Treatment/Interventions  ADLs/Self Care Home Management;Canalith Repostioning;DME Instruction;Gait training;Stair training;Functional mobility training;Therapeutic activities;Therapeutic exercise;Balance training;Patient/family education;Neuromuscular re-education;Prosthetic Training;Manual techniques;Vestibular    PT Next Visit Plan  did he log CBG/BP and is there a  correlation between readings and dizziness?  teach x 1 viewing.  Did Susan verify insurance benefits?  Pt would also like exercises to strengthen his back.  Gait and standing balance with decreased UE support; gait with cane    Consulted and Agree with Plan of Care  Patient       Patient will benefit from skilled therapeutic intervention in order to improve the following deficits and impairments:  Abnormal gait, Decreased activity tolerance, Decreased balance, Decreased coordination, Decreased endurance, Decreased knowledge of use of DME, Decreased mobility, Decreased range of motion, Decreased strength, Dizziness, Impaired flexibility, Prosthetic Dependency, Postural dysfunction, Pain, Decreased skin integrity  Visit Diagnosis: Muscle weakness (generalized)  Other abnormalities of gait and mobility  Unsteadiness on feet  Dizziness and giddiness  Difficulty in walking, not elsewhere classified  Repeated falls     Problem List Patient Active Problem List   Diagnosis Date Noted  . SOB (shortness of breath) 12/06/2017  . Uncontrolled type 2 diabetes mellitus with insulin therapy (HCC) 12/06/2017  . Primary osteoarthritis of left knee 11/02/2017  . Open wound 06/05/2017  . Diabetic ulcer of left lower leg (HCC) 05/16/2017  . Acquired absence of left leg below knee (HCC) 02/28/2017  . Impaired mobility and activities of daily living 02/15/2017  . Atherosclerotic heart disease of native coronary artery without angina pectoris 01/05/2017  . HTN (hypertension) 12/19/2016  . Atherosclerosis of native arteries of the extremities with ulceration (HCC) 12/19/2016  . Peripheral vascular disease (HCC) 12/07/2016  . CHF (congestive heart failure) (HCC) 11/24/2016  . Demand ischemia (HCC)   . 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 NSTEMI (non-ST elevated myocardial infarction) (HCC) 11/23/2016  .  Acute diastolic CHF (congestive heart failure) (HCC) 11/23/2016  . Acute congestive heart failure (HCC)   . Burn of left foot 11/21/2016  . Diabetic peripheral neuropathy (HCC) 11/21/2016  . Diabetic ulcer of right foot associated with type 2 diabetes mellitus, with fat layer exposed (HCC)   11/21/2016  . DDD (degenerative disc disease), lumbar 03/27/2016  . History of deep vein thrombosis (DVT) of lower extremity 03/26/2016  . History of pulmonary embolism 10/01/2015  . Nephropathy, diabetic ([place]) 10/01/2015  . 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 Chronic obstructive pulmonary disease, unspecified ([place]) 12/16/2013  . Medication management 04/14/2013  . History of recurrent DVT/E 04/11/2013  . Sleep apnea 04/11/2013  . Bradycardia 04/11/2013  . History of Elevated CK level  04/11/2013  . Class 1 obesity due to excess calories with serious comorbidity and body mass index (BMI) of 34.0 to 34.9 in adult 04/11/2013  . Peripheral neuropathy ([place])   . Charcot's arthropathy   . Chronic diastolic heart failure ([place]) 04/08/2013  . Paroxysmal atrial fibrillation ([place]) 04/07/2013  . Reactive depression (situational) 05/16/2011  . Long term (current) use of anticoagulants 09/21/2010  . 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 ED (erectile dysfunction) of organic origin 07/28/2010  . Poorly controlled type 2 diabetes mellitus with peripheral neuropathy ([place]) 05/26/2008  . Hyperlipidemia 05/26/2008  . Hypertensive heart disease 05/26/2008    Rico Junker, PT, DPT 02/14/18    12:08 PM    [facility] [address] [place], [place], [address] Phone: [phone]   Fax:  [phone]  Name: VALERIA KRISKO MRN: [id number] Date of Birth: [birth year]

## 2018-02-14 NOTE — Patient Instructions (Addendum)
Blood Pressure Record Sheet To take your blood pressure, you will need a blood pressure machine. You can buy a blood pressure machine (blood pressure monitor) at your clinic, drug store, or online. When choosing one, consider:  An automatic monitor that has an arm cuff.  A cuff that wraps snugly around your upper arm. You should be able to fit only one finger between your arm and the cuff.  A device that stores blood pressure reading results.  Do not choose a monitor that measures your blood pressure from your wrist or finger. Follow your health care provider's instructions for how to take your blood pressure. To use this form:  Get one reading in the morning (a.m.) before you take any medicines.  Get one reading in the evening (p.m.) before supper.  Take at least 2 readings with each blood pressure check. This makes sure the results are correct. Wait 1-2 minutes between measurements.  Write down the results in the spaces on this form.  Repeat this once a week, or as told by your health care provider.  Make a follow-up appointment with your health care provider to discuss the results. Blood pressure log Date: _______________________  a.m. _____________________(1st reading) _____________________(2nd reading)  p.m. _____________________(1st reading) _____________________(2nd reading) Date: _______________________  a.m. _____________________(1st reading) _____________________(2nd reading)  p.m. _____________________(1st reading) _____________________(2nd reading) Date: _______________________  a.m. _____________________(1st reading) _____________________(2nd reading)  p.m. _____________________(1st reading) _____________________(2nd reading) Date: _______________________  a.m. _____________________(1st reading) _____________________(2nd reading)  p.m. _____________________(1st reading) _____________________(2nd reading) Date: _______________________  a.m.  _____________________(1st reading) _____________________(2nd reading)  p.m. _____________________(1st reading) _____________________(2nd reading) This information is not intended to replace advice given to you by your health care provider. Make sure you discuss any questions you have with your health care provider. Document Released: 10/29/2002 Document Revised: 01/30/2017 Document Reviewed: 01/30/2017 Elsevier Interactive Patient Education  2019 [employer].

## 2018-02-20 ENCOUNTER — Encounter: Payer: Self-pay | Admitting: Physical Therapy

## 2018-02-20 ENCOUNTER — Ambulatory Visit: Payer: Managed Care, Other (non HMO) | Admitting: Physical Therapy

## 2018-02-20 VITALS — BP 130/60

## 2018-02-20 DIAGNOSIS — M6281 Muscle weakness (generalized): Secondary | ICD-10-CM | POA: Diagnosis not present

## 2018-02-20 DIAGNOSIS — R2681 Unsteadiness on feet: Secondary | ICD-10-CM

## 2018-02-20 DIAGNOSIS — R42 Dizziness and giddiness: Secondary | ICD-10-CM

## 2018-02-20 DIAGNOSIS — R2689 Other abnormalities of gait and mobility: Secondary | ICD-10-CM

## 2018-02-20 NOTE — Patient Instructions (Addendum)
Access Code: [email or web address]  URL: [email or web address]  Date: 02/20/2018  Prepared by: Willow Ora   Exercises  Seated Gaze Stabilization with Head Rotation - 3 reps - 1 sets - 30 hold - 1x daily - 5x weekly  Seated Gaze Stabilization with Head Nod - 3 reps - 1 sets - 30 hold - 1x daily - 5x weekly  Seated Pelvic Tilts - 10 reps - 1 sets - 1x daily - 5x weekly

## 2018-02-21 NOTE — Therapy (Signed)
[facility] [address] [place], [place], [address] Phone: [phone]   Fax:  [phone]  Physical Therapy Treatment  Patient Details  Name: Bobby Lindsey MRN: [id number] Date of Birth: [birth year] Referring Provider (PT): Rhina Brackett, MD   Encounter Date: 02/20/2018  PT End of Session - 02/20/18 1327    Visit Number  16       Number of Visits  30    Authorization Type  [employer] Managed Pt has met VL. Wife reports approved for 10 additional visits for now. Wife & pt plan to apply for [place].     Authorization Time Period  visit limit should reset in 2020 & he wants to continue PT to work on balance & gait with prosthesis    Authorization - Visit Number  2    Authorization - Number of Visits  20    PT Start Time  1321    PT Stop Time  1400    PT Time Calculation (min)  39 min    Activity Tolerance  Patient tolerated treatment well    Behavior During Therapy  WFL for tasks assessed/performed       Past Medical History:  Diagnosis Date  . Anginal pain ([place])    last pm  . Atrial fibrillation ([place])    a. Transient during 03/2013 admission.  . Bradycardia    a. Bradycardia/pauses/possible Mobitz II during 03/2013 adm. Not on BB due to this.  Marland Kitchen CAD (coronary artery disease)    a. s/p CABG 2002. b. Hx Cypher stent to the RCA. c. Inf-lat STEMI 03/2013:  LHC (04/05/13):  mLAD occluded, pD1 90, apical br of Dx occluded, CFX occluded, pOM1 90-95, RCA stents patent, diff RCA 30, S-Dx occluded, S-PDA occluded, S-OM1 40-50, L-LAD patent, EF 40% with inf HK.  PCI:  Promus (2.5 x 28) DES to mid to dist CFX.  Marland Kitchen Charcot's joint of knee   . COPD (chronic obstructive pulmonary disease) ([place])   . Deep venous thrombosis (H[facility])    right lower extremity  . Diabetes mellitus    a. A1C 10.7 in 03/2013.  . Diastolic CHF ([place])    a. EF 40% by cath, 55-60% during 03/2013 adm, required IV diuresis.  . DVT, lower  extremity, recurrent ([place])    a. Hx recurrent DVT per record.  . Dyslipidemia   . Elevated CK    a. Pt has refused rheum workup in the past.  . GERD (gastroesophageal reflux disease)   . History of hiatal hernia   . HTN (hypertension)    x 15 years  . Hx of cardiovascular stress test    a. Lexiscan Myoview (03/2010):  diaph atten vs inf scar, no ischemia, EF 47%; Low Risk.  Marland Kitchen Hx of echocardiogram    a. Echo (04/08/13):  Mild LVH, EF 55-60%, restrictive physiology, severe LAE, mild reduced RVSF, mild RAE.  . Leg pain    ABI 6/16:  R 1.2, L 1.1 - normal  . Myocardial infarction ([place])   . Obesity   . Peripheral neuropathy   . Pulmonary embolism ([place])   . Sleep apnea     Past Surgical History:  Procedure Laterality Date  . CORONARY ANGIOPLASTY WITH STENT PLACEMENT    . CORONARY ARTERY BYPASS GRAFT     4 time since 2002  . LEFT HEART CATHETERIZATION WITH CORONARY/GRAFT ANGIOGRAM  04/05/2013   Procedure: LEFT HEART CATHETERIZATION WITH Beatrix Fetters;  Surgeon: Peter M Martinique,  MD;  Location: [facility]ATH [facility];  Service: Cardiovascular;;  . left knee surgery    . LOWER EXTREMITY ANGIOGRAPHY Left 12/25/2016   Procedure: LOWER EXTREMITY ANGIOGRAPHY;  Surgeon: Algernon Huxley, MD;  Location: [facility];  Service: Cardiovascular;  Laterality: Left;  . PERCUTANEOUS CORONARY STENT INTERVENTION (PCI-S)  04/05/2013   Procedure: PERCUTANEOUS CORONARY STENT INTERVENTION (PCI-S);  Surgeon: Peter M Martinique, MD;  Location: [facility];  Service: Cardiovascular;;  DES to native Mid cx  . VASCULAR SURGERY      Vitals:   02/20/18 1324  BP: 130/60    Subjective Assessment - 02/20/18 1324    Subjective  No falls. Continues to report incr back pain with activity. Did not record his readings (blood sugar and BP) as directed to last session. Continues have dizzy episodes, not every day. Is dizzy at this time in therapy, however feels that is due to no sleeping but 2 hours last night.      Pertinent History  L TTA, DM2, perepheral neuropathy, PVD, CAD, CABG X3, CHF, DVTs, HTN, HLD, A-Fib,     Limitations  Standing;House hold activities;Walking    How long can you sit comfortably?  as long as he wants to sit    How long can you stand comfortably?  unable    How long can you walk comfortably?  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  Yes    Pain Score  8    8/10 with walking, 2/10 at rest   Pain Location  Back    Pain Orientation  Lower    Pain Descriptors / Indicators  Aching    Pain Type  Chronic pain    Pain Onset  More than a month ago    Pain Frequency  Intermittent    Aggravating Factors   walking and standing    Pain Relieving Factors  sitting down, resting           [facility] Adult PT Treatment/Exercise - 02/20/18 1730      Transfers   Transfers  Sit to Stand;Stand to Sit    Sit to Stand  5: Supervision;With upper extremity assist;With armrests;From chair/3-in-1   able to stabilize without UE support   Stand to Sit  5: Supervision;With upper extremity assist;With armrests;To chair/3-in-1      Ambulation/Gait   Ambulation/Gait  Yes    Ambulation/Gait Assistance  5: Supervision    Ambulation/Gait Assistance Details  pt uisng rollator today. needed to stop about half way each time due to back pain, with encouragement was able to continue after standing rest only vs seated on rollaotr.     Ambulation Distance (Feet)  100 Feet   x2   Assistive device  Rollator;Prosthesis    Gait Pattern  Step-through pattern;Decreased arm swing - left;Decreased step length - right;Decreased stance time - left;Decreased stride length;Decreased hip/knee flexion - left;Decreased weight shift to left;Left flexed knee in stance;Antalgic;Trunk flexed;Abducted - left    Ambulation Surface  Level;Indoor      Prosthetics   Prosthetic Care Comments   rash appears to be clearing. PT reminded  patient that cleaning liner daily, using antiperspirant and drying limb/liner properly will help skin / rash.     Current prosthetic wear tolerance (days/week)   daily    Current prosthetic wear tolerance (#hours/day)   most of awake hours    Current  prosthetic weight-bearing tolerance (hours/day)   Patient tolerates standing & gait with no limb pain. However he experience back pain with gait >100' with cane or less.     Residual limb condition   scab is dry & appears to be healing. light pink rash under area of liner improve from red, irritated appearance 2 weeks ago.       Vestibular Treatment/Exercise - 02/20/18 1333      Vestibular Treatment/Exercise   Habituation Exercises  Seated Horizontal Head Turns;Seated Vertical Head Turns      Seated Horizontal Head Turns   Number of Reps   3    Symptom Description   for 30 sec's each with mild incr in dizziness reported that resolved with rest breaks between reps      Seated Vertical Head Turns   Number of Reps   3    Symptom Description   for 30 sec's each with mild symptom increase that resolved with rest breaks between reps            PT Education - 02/20/18 1730    Education Details  added seated x1 viewing ex's to HEP today.     Person(s) Educated  Patient    Methods  Explanation;Demonstration;Verbal cues;Handout    Comprehension  Verbalized understanding;Returned demonstration;Verbal cues required;Need further instruction       PT Short Term Goals - 01/31/18 1258      PT SHORT TERM GOAL #1   Title  Patient verbalizes proper skin management for residual limb with use of prosthesis. (All updated STGs Target Date: 03/15/2018)    Time  1    Period  Months    Status  New    Target Date  03/15/18      PT SHORT TERM GOAL #2   Title  Patient reports back pain increases <4 increments on 0-10 scale with gait & standing activities.     Time  1    Period  Months    Status  New    Target Date  03/15/18      PT SHORT TERM GOAL  #3   Title  Patient ambulates 1' with cane & prosthesis scanning environment with supervision.     Time  1    Period  Months    Status  New    Target Date  03/15/18      PT SHORT TERM GOAL #4   Title  Patient negotiates ramps, curbs with cane & stairs with 1 rail/cane with prosthesis with minimal assist.     Time  1    Period  Months    Status  Revised    Target Date  03/15/18      PT SHORT TERM GOAL #5   Title  Patient able to stand 2 minutes without UE support safely.     Time  1    Period  Months    Status  New    Target Date  03/15/18        PT Long Term Goals - 01/31/18 1306      PT LONG TERM GOAL #1   Title  Patient verbalizes proper prosthetic care including problem solving new issues and tolerates wear >90% of awake hours without issues. (All updated LTGs Target Date: 04/12/2018)    Time  2    Period  Months    Status  Revised    Target Date  04/12/18      PT LONG TERM GOAL #2   Title  Patient reports back pain increases </= 2 increments on 0-10 scale with standing & gait activities and verbalizes proper use of assistive devices to maximize function without significant pain increases.     Time  2    Period  Months    Status  New    Target Date  04/12/18      PT LONG TERM GOAL #3   Title  Berg Balance >36/56 to indicate lower fall risk & less dependency in standing ADLs.     Time  3    Period  Months    Status  On-going    Target Date  04/12/18      PT LONG TERM GOAL #4   Title  Patient ambulates 200' including paved surfaces with cane & prosthesis modified indepenent.     Time  2    Period  Months    Status  Revised    Target Date  04/12/18      PT LONG TERM GOAL #5   Title  Patient negotiates ramps, curbs & stairs 1 rail with cane & prosthesis modified independent to enable community access.     Time  2    Period  Months    Status  Revised    Target Date  04/12/18            Plan - 02/20/18 1327    Clinical Impression Statement  Today's  skilled session continued to address gait with rollator, decreased vestibular system imput and low back pain. Added seated pelvic tilts to HEP today. Attempted use of band with UE's for core strengthening, however pt unable to do this due to right RTC tear/issues. Will need to further address this at next session as back pain appears to be the primary limiting factor with mobility.     Rehab Potential  Good    Clinical Impairments Affecting Rehab Potential  weakness BUEs, BLEs & trunk, impaired balance with high fall risk, dependent gait with prosthesis, dependent in proper prosthetic care & use    PT Frequency  2x / week    PT Duration  8 weeks    PT Treatment/Interventions  ADLs/Self Care Home Management;Canalith Repostioning;DME Instruction;Gait training;Stair training;Functional mobility training;Therapeutic activities;Therapeutic exercise;Balance training;Patient/family education;Neuromuscular re-education;Prosthetic Training;Manual techniques;Vestibular    PT Next Visit Plan  did he log CBG/BP and is there a correlation between readings and dizziness (did not for the 02/20/18 session, suppossed to bring this to his Friday 02/22/18 appt);    Did Manuela Schwartz verify insurance benefits ([place] i am checking with Shirlean Mylar on this, no new notes seen in EPIC);  Pt would also like exercises to strengthen his back: try supine (not sure he can lay flat) or seated on a stool with feet on ground,   Gait and standing balance with decreased UE support; gait with cane    PT Home Exercise Plan  Access Code: [email or web address]     Consulted and Agree with Plan of Care  Patient       Patient will benefit from skilled therapeutic intervention in order to improve the following deficits and impairments:  Abnormal gait, Decreased activity tolerance, Decreased balance, Decreased coordination, Decreased endurance, Decreased knowledge of use of DME, Decreased mobility, Decreased range of motion, Decreased strength, Dizziness, Impaired  flexibility, Prosthetic Dependency, Postural dysfunction, Pain, Decreased skin integrity  Visit Diagnosis: Muscle weakness (generalized)  Other abnormalities of gait and mobility  Unsteadiness on feet  Dizziness and giddiness     Problem List Patient Active Problem List  Diagnosis Date Noted  . SOB (shortness of breath) 12/06/2017  . Uncontrolled type 2 diabetes mellitus with insulin therapy ([place]) 12/06/2017  . Primary osteoarthritis of left knee 11/02/2017  . Open wound 06/05/2017  . Diabetic ulcer of left lower leg ([place]) 05/16/2017  . Acquired absence of left leg below knee ([place]) 02/28/2017  . Impaired mobility and activities of daily living 02/15/2017  . Atherosclerotic heart disease of native coronary artery without angina pectoris 01/05/2017  . HTN (hypertension) 12/19/2016  . Atherosclerosis of native arteries of the extremities with ulceration ([place]) 12/19/2016  . Peripheral vascular disease ([place]) 12/07/2016  . CHF (congestive heart failure) ([place]) 11/24/2016  . Demand ischemia ([place])   . 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 NSTEMI (non-ST elevated myocardial infarction) ([place]) 11/23/2016  . Acute diastolic CHF (congestive heart failure) ([place]) 11/23/2016  . Acute congestive heart failure ([place])   . Burn of left foot 11/21/2016  . Diabetic peripheral neuropathy ([place]) 11/21/2016  . Diabetic ulcer of right foot associated with type 2 diabetes mellitus, with fat layer exposed ([place]) 11/21/2016  . DDD (degenerative disc disease), lumbar 03/27/2016  . History of deep vein thrombosis (DVT) of lower extremity 03/26/2016  . History of pulmonary embolism 10/01/2015  . Nephropathy, diabetic ([place]) 10/01/2015  . 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 Chronic obstructive pulmonary disease, unspecified ([place]) 12/16/2013  . Medication management 04/14/2013  . History of  recurrent DVT/E 04/11/2013  . Sleep apnea 04/11/2013  . Bradycardia 04/11/2013  . History of Elevated CK level  04/11/2013  . Class 1 obesity due to excess calories with serious comorbidity and body mass index (BMI) of 34.0 to 34.9 in adult 04/11/2013  . Peripheral neuropathy ([place])   . Charcot's arthropathy   . Chronic diastolic heart failure ([place]) 04/08/2013  . Paroxysmal atrial fibrillation ([place]) 04/07/2013  . Reactive depression (situ[place]) 05/16/2011  . Long term (current) use of anticoagulants 09/21/2010  . 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 ED (erectile dysfunction) of organic origin 07/28/2010  . Poorly controlled type 2 diabetes mellitus with peripheral neuropathy ([place]) 05/26/2008  . Hyperlipidemia 05/26/2008  . Hypertensive heart disease 05/26/2008    Willow Ora, PTA, [facility] [address] [place], [place] [address] [phone] 02/21/18, 12:03 PM   Name: TYMEER VAQUERA MRN: [id number] Date of Birth: April 26, 1955

## 2018-02-22 ENCOUNTER — Ambulatory Visit: Payer: Managed Care, Other (non HMO)

## 2018-02-25 ENCOUNTER — Emergency Department (HOSPITAL_COMMUNITY)
Admission: EM | Admit: 2018-02-25 | Discharge: 2018-02-25 | Disposition: A | Discharge status: Home / Self Care | Payer: Managed Care, Other (non HMO) | Attending: Emergency Medicine | Admitting: Emergency Medicine

## 2018-02-25 ENCOUNTER — Ambulatory Visit: Payer: Managed Care, Other (non HMO) | Admitting: Physical Therapy

## 2018-02-25 ENCOUNTER — Emergency Department (HOSPITAL_COMMUNITY): Payer: Managed Care, Other (non HMO)

## 2018-02-25 DIAGNOSIS — Z794 Long term (current) use of insulin: Secondary | ICD-10-CM | POA: Insufficient documentation

## 2018-02-25 DIAGNOSIS — I5030 Unspecified diastolic (congestive) heart failure: Secondary | ICD-10-CM | POA: Diagnosis not present

## 2018-02-25 DIAGNOSIS — I252 Old myocardial infarction: Secondary | ICD-10-CM | POA: Diagnosis not present

## 2018-02-25 DIAGNOSIS — E1122 Type 2 diabetes mellitus with diabetic chronic kidney disease: Secondary | ICD-10-CM | POA: Insufficient documentation

## 2018-02-25 DIAGNOSIS — Z79899 Other long term (current) drug therapy: Secondary | ICD-10-CM | POA: Insufficient documentation

## 2018-02-25 DIAGNOSIS — R197 Diarrhea, unspecified: Secondary | ICD-10-CM

## 2018-02-25 DIAGNOSIS — I130 Hypertensive heart and chronic kidney disease with heart failure and stage 1 through stage 4 chronic kidney disease, or unspecified chronic kidney disease: Secondary | ICD-10-CM | POA: Insufficient documentation

## 2018-02-25 DIAGNOSIS — R1084 Generalized abdominal pain: Secondary | ICD-10-CM | POA: Diagnosis not present

## 2018-02-25 DIAGNOSIS — N183 Chronic kidney disease, stage 3 (moderate): Secondary | ICD-10-CM | POA: Insufficient documentation

## 2018-02-25 DIAGNOSIS — Z7901 Long term (current) use of anticoagulants: Secondary | ICD-10-CM | POA: Diagnosis not present

## 2018-02-25 DIAGNOSIS — J449 Chronic obstructive pulmonary disease, unspecified: Secondary | ICD-10-CM | POA: Insufficient documentation

## 2018-02-25 DIAGNOSIS — Z955 Presence of coronary angioplasty implant and graft: Secondary | ICD-10-CM | POA: Insufficient documentation

## 2018-02-25 DIAGNOSIS — I2510 Atherosclerotic heart disease of native coronary artery without angina pectoris: Secondary | ICD-10-CM | POA: Diagnosis not present

## 2018-02-25 LAB — URINALYSIS, ROUTINE W REFLEX MICROSCOPIC
Bilirubin Urine: NEGATIVE
Glucose, UA: 150 mg/dL — AB
Ketones, ur: NEGATIVE mg/dL
Leukocytes, UA: NEGATIVE
Nitrite: NEGATIVE
Protein, ur: NEGATIVE mg/dL
Specific Gravity, Urine: 1.012 (ref 1.005–1.030)
pH: 5 (ref 5.0–8.0)

## 2018-02-25 LAB — COMPREHENSIVE METABOLIC PANEL
ALT: 17 U/L (ref 0–44)
AST: 21 U/L (ref 15–41)
Albumin: 3.8 g/dL (ref 3.5–5.0)
Alkaline Phosphatase: 57 U/L (ref 38–126)
Anion gap: 8 (ref 5–15)
BUN: 24 mg/dL — ABNORMAL HIGH (ref 8–23)
CO2: 26 mmol/L (ref 22–32)
Calcium: 9.4 mg/dL (ref 8.9–10.3)
Chloride: 101 mmol/L (ref 98–111)
Creatinine, Ser: 1.22 mg/dL (ref 0.61–1.24)
GFR calc Af Amer: >60 mL/min (ref >60)
GFR calc non Af Amer: >60 mL/min (ref >60)
Glucose, Bld: 227 mg/dL — ABNORMAL HIGH (ref 70–99)
Potassium: 4.9 mmol/L (ref 3.5–5.1)
Sodium: 135 mmol/L (ref 135–145)
Total Bilirubin: 0.7 mg/dL (ref 0.3–1.2)
Total Protein: 7.7 g/dL (ref 6.5–8.1)

## 2018-02-25 LAB — CBC
HCT: 49.7 % (ref 39.0–52.0)
Hemoglobin: 16.6 g/dL (ref 13.0–17.0)
MCH: 28.2 pg (ref 26.0–34.0)
MCHC: 33.4 g/dL (ref 30.0–36.0)
MCV: 84.4 fL (ref 80.0–100.0)
Platelets: 198 10*3/uL (ref 150–400)
RBC: 5.89 MIL/uL — ABNORMAL HIGH (ref 4.22–5.81)
RDW: 14.1 % (ref 11.5–15.5)
WBC: 9.7 10*3/uL (ref 4.0–10.5)
nRBC: 0 % (ref 0.0–0.2)

## 2018-02-25 LAB — LIPASE, BLOOD: Lipase: 31 U/L (ref 11–51)

## 2018-02-25 MED ORDER — IOHEXOL 300 MG/ML  SOLN
100.0000 mL | Freq: Once | INTRAMUSCULAR | Status: AC | PRN
  Administered 2018-02-25: 100 mL via INTRAVENOUS

## 2018-02-25 MED ORDER — SODIUM CHLORIDE 0.9 % IV BOLUS
500.0000 mL | Freq: Once | INTRAVENOUS | Status: AC
  Administered 2018-02-25: 500 mL via INTRAVENOUS

## 2018-02-25 NOTE — Discharge Instructions (Signed)
Return if any problems.  See your Physician for recheck in 2-3 days °

## 2018-02-25 NOTE — ED Notes (Signed)
Pt was given gram crackers and peanut butter at d/c-Monique,RN

## 2018-02-25 NOTE — ED Triage Notes (Signed)
Pt here with c/o abd pain and n/v/d time 2 days , no fevers

## 2018-02-25 NOTE — ED Provider Notes (Signed)
[facility] EMERGENCY DEPARTMENT Provider Note   CSN: [id number] Arrival date & time: 02/25/18  1350     History   Chief Complaint Chief Complaint  Patient presents with  . Abdominal Pain    HPI Bobby Lindsey is a 63 y.o. male.  The history is provided by the patient. No language interpreter was used.  Abdominal Pain  Pain location:  Generalized Pain quality: aching   Pain radiates to:  Does not radiate Pain severity:  Moderate Onset quality:  Gradual Duration:  2 days Timing:  Constant Progression:  Worsening Chronicity:  New Context: not alcohol use   Relieved by:  Nothing Worsened by:  Nothing Ineffective treatments:  None tried Associated symptoms: nausea   Risk factors: no alcohol abuse     Past Medical History:  Diagnosis Date  . Anginal pain ([place])    last pm  . Atrial fibrillation ([place])    a. Transient during 03/2013 admission.  . Bradycardia    a. Bradycardia/pauses/possible Mobitz II during 03/2013 adm. Not on BB due to this.  Marland Kitchen CAD (coronary artery disease)    a. s/p CABG 2002. b. Hx Cypher stent to the RCA. c. Inf-lat STEMI 03/2013:  LHC (04/05/13):  mLAD occluded, pD1 90, apical br of Dx occluded, CFX occluded, pOM1 90-95, RCA stents patent, diff RCA 30, S-Dx occluded, S-PDA occluded, S-OM1 40-50, L-LAD patent, EF 40% with inf HK.  PCI:  Promus (2.5 x 28) DES to mid to dist CFX.  Marland Kitchen Charcot's joint of knee   . COPD (chronic obstructive pulmonary disease) ([place])   . Deep venous thrombosis (HCC)    right lower extremity  . Diabetes mellitus    a. A1C 10.7 in 03/2013.  . Diastolic CHF ([place])    a. EF 40% by cath, 55-60% during 03/2013 adm, required IV diuresis.  . DVT, lower extremity, recurrent ([place])    a. Hx recurrent DVT per record.  . Dyslipidemia   . Elevated CK    a. Pt has refused rheum workup in the past.  . GERD (gastroesophageal reflux disease)   . History of hiatal hernia   . HTN (hypertension)    x 15 years  . Hx of  cardiovascular stress test    a. Lexiscan Myoview (03/2010):  diaph atten vs inf scar, no ischemia, EF 47%; Low Risk.  Marland Kitchen Hx of echocardiogram    a. Echo (04/08/13):  Mild LVH, EF 55-60%, restrictive physiology, severe LAE, mild reduced RVSF, mild RAE.  . Leg pain    ABI 6/16:  R 1.2, L 1.1 - normal  . Myocardial infarction ([place])   . Obesity   . Peripheral neuropathy   . Pulmonary embolism ([place])   . Sleep apnea     Patient Active Problem List   Diagnosis Date Noted  . SOB (shortness of breath) 12/06/2017  . Uncontrolled type 2 diabetes mellitus with insulin therapy ([place]) 12/06/2017  . Primary osteoarthritis of left knee 11/02/2017  . Open wound 06/05/2017  . Diabetic ulcer of left lower leg ([place]) 05/16/2017  . 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 Impaired mobility and activities of daily living 02/15/2017  . Atherosclerotic heart disease of native coronary artery without angina pectoris 01/05/2017  . HTN (hypertension) 12/19/2016  . Atherosclerosis of native arteries of the extremities with ulceration ([place]) 12/19/2016  . Peripheral vascular disease ([place]) 12/07/2016  . CHF (congestive heart failure) ([place]) 11/24/2016  . Demand ischemia ([place])   .  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 Stage 3 chronic kidney disease due to diabetes mellitus ([place]) 11/23/2016  . NSTEMI (non-ST elevated myocardial infarction) ([place]) 11/23/2016  . Acute diastolic CHF (congestive heart failure) ([place]) 11/23/2016  . Acute congestive heart failure ([place])   . Burn of left foot 11/21/2016  . Diabetic peripheral neuropathy ([place]) 11/21/2016  . Diabetic ulcer of right foot associated with type 2 diabetes mellitus, with fat layer exposed ([place]) 11/21/2016  . DDD (degenerative disc disease), lumbar 03/27/2016  . History of deep vein thrombosis (DVT) of lower extremity 03/26/2016  . History of pulmonary embolism 10/01/2015  . Nephropathy, diabetic ([place]) 10/01/2015  . Familial  multiple lipoprotein-type hyperlipidemia 03/15/2015  . Chronic obstructive pulmonary disease, unspecified ([place]) 12/16/2013  . Medication management 04/14/2013  . History of recurrent DVT/E 04/11/2013  . Sleep apnea 04/11/2013  . Bradycardia 04/11/2013  . History of Elevated CK level  04/11/2013  . Class 1 obesity due to excess calories with serious comorbidity and body mass index (BMI) of 34.0 to 34.9 in adult 04/11/2013  . Peripheral neuropathy ([place])   . Charcot's arthropathy   . Chronic diastolic heart failure ([place]) 04/08/2013  . Paroxysmal atrial fibrillation ([place]) 04/07/2013  . Reactive depression ([place]) 05/16/2011  . Long term (current) use of anticoagulants 09/21/2010  . 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 ED (erectile dysfunction) of organic origin 07/28/2010  . Poorly controlled type 2 diabetes mellitus with peripheral neuropathy ([place]) 05/26/2008  . Hyperlipidemia 05/26/2008  . Hypertensive heart disease 05/26/2008    Past Surgical History:  Procedure Laterality Date  . CORONARY ANGIOPLASTY WITH STENT PLACEMENT    . CORONARY ARTERY BYPASS GRAFT     4 time since 2002  . LEFT HEART CATHETERIZATION WITH CORONARY/GRAFT ANGIOGRAM  04/05/2013   Procedure: LEFT HEART CATHETERIZATION WITH Beatrix Fetters;  Surgeon: Peter M Martinique, MD;  Location: [facility] CATH LAB;  Service: Cardiovascular;;  . left knee surgery    . LOWER EXTREMITY ANGIOGRAPHY Left 12/25/2016   Procedure: LOWER EXTREMITY ANGIOGRAPHY;  Surgeon: Algernon Huxley, MD;  Location: [facility] CV LAB;  Service: Cardiovascular;  Laterality: Left;  . PERCUTANEOUS CORONARY STENT INTERVENTION (PCI-S)  04/05/2013   Procedure: PERCUTANEOUS CORONARY STENT INTERVENTION (PCI-S);  Surgeon: Peter M Martinique, MD;  Location: [facility] CATH LAB;  Service: Cardiovascular;;  DES to native Mid cx  . VASCULAR SURGERY          Home Medications    Prior to Admission medications   Medication Sig Start Date End Date Taking? Authorizing Provider    acetaminophen (TYLENOL) 500 MG tablet Take 1,000 mg every 8 (eight) hours as needed by mouth for mild pain or headache.     [provider]  amoxicillin (AMOXIL) 875 MG tablet Take 1 tablet (875 mg total) by mouth 2 (two) times daily. 12/06/17   Darlyne Russian, MD  atorvastatin (LIPITOR) 20 MG tablet Take 20 mg daily by mouth. 11/27/16   [provider]  azelastine (ASTELIN) 0.1 % nasal spray Place into the nose. 04/06/17 11/14/18  [provider]  Blood Glucose Monitoring Suppl (FIFTY50 GLUCOSE METER 2.0) w/Device KIT Use as directed. 04/27/14   [provider]  furosemide (LASIX) 80 MG tablet Take 1 tablet (80 mg total) by mouth daily. NEED OV. 01/28/18   Almyra Deforest, PA  gabapentin (NEURONTIN) 300 MG capsule Take 600 mg daily as needed by mouth (pain in foot and ankle).     [provider]  insulin lispro (HUMALOG KWIKPEN) 100  UNIT/ML injection Inject 22-25 Units 3 (three) times daily before meals into the skin. Per sliding scale    [provider]  Insulin Syringe-Needle U-100 31G X 5/16" 0.3 ML MISC use as directed 02/12/08   [provider]  isosorbide mononitrate (IMDUR) 30 MG 24 hr tablet TAKE 1 TABLET BY MOUTH EVERY DAY 12/24/17   Almyra Deforest, PA  losartan (COZAAR) 25 MG tablet Take 1 tablet (25 mg total) by mouth daily. 01/02/17   Almyra Deforest, PA  metoprolol succinate (TOPROL-XL) 25 MG 24 hr tablet Take 0.5 tablets (12.5 mg total) by mouth daily. NEED OV. 02/04/18   Almyra Deforest, PA  nitroGLYCERIN (NITROSTAT) 0.4 MG SL tablet Place 1 tablet (0.4 mg total) under the tongue every 5 (five) minutes as needed for chest pain. NEEDS APPOINTMENT 08/15/16   Burtis Junes, NP  pantoprazole (PROTONIX) 40 MG tablet TAKE 1 TABLET (40 MG TOTAL) BY MOUTH DAILY AS NEEDED (INDIGESTION). 11/22/17   Meng, Isaac Laud, PA  TRULICITY 1.47 WG/9.5AO SOPN INJECT 0.75 MG SUBCUTANEOUSLY EVERY 7 (SEVEN) DAYS. 11/27/16   [provider]  warfarin (COUMADIN) 5 MG  tablet Take 2 tablets (10 mg total) by mouth one time only at 6 PM. 01/02/17   Almyra Deforest, PA  warfarin (COUMADIN) 5 MG tablet TAKE 1 TO 2 TABLETS DAILY AS DIRECTED BY COUMADIN CLINIC 12/20/17   Minus Breeding, MD    Family History Family History  Problem Relation Age of Onset  . Heart disease Mother   . Hypertension Mother   . Cancer Father   . Hypertension Sister   . Heart attack Neg Hx   . Stroke Neg Hx     Social History Social History   Tobacco Use  . Smoking status: Never Smoker  . Smokeless tobacco: Never Used  Substance Use Topics  . Alcohol use: Yes    Comment: rare  . Drug use: No     Allergies   Simvastatin   Review of Systems Review of Systems  Gastrointestinal: Positive for abdominal pain and nausea.  All other systems reviewed and are negative.    Physical Exam Updated Vital Signs BP (!) 130/94   Pulse 65   Temp 97.7 F (36.5 C)   Resp 16   SpO2 98%   Physical Exam Vitals signs and nursing note reviewed.  Constitutional:      Appearance: He is well-developed.  HENT:     Head: Normocephalic and atraumatic.     Mouth/Throat:     Mouth: Mucous membranes are moist.  Eyes:     Extraocular Movements: Extraocular movements intact.     Conjunctiva/sclera: Conjunctivae normal.  Neck:     Musculoskeletal: Neck supple.  Cardiovascular:     Rate and Rhythm: Normal rate and regular rhythm.     Heart sounds: No murmur.  Pulmonary:     Effort: Pulmonary effort is normal. No respiratory distress.     Breath sounds: Normal breath sounds.  Abdominal:     General: Abdomen is flat. Bowel sounds are normal.     Palpations: Abdomen is soft.     Tenderness: There is generalized abdominal tenderness.  Skin:    General: Skin is warm and dry.  Neurological:     General: No focal deficit present.     Mental Status: He is alert.      ED Treatments / Results  Labs (all labs ordered are listed, but only abnormal results are displayed) Labs Reviewed    COMPREHENSIVE METABOLIC PANEL -  Abnormal; Notable for the following components:      Result Value   Glucose, Bld 227 (*)    BUN 24 (*)    All other components within normal limits  CBC - Abnormal; Notable for the following components:   RBC 5.89 (*)    All other components within normal limits  URINALYSIS, ROUTINE W REFLEX MICROSCOPIC - Abnormal; Notable for the following components:   Glucose, UA 150 (*)    Hgb urine dipstick SMALL (*)    Bacteria, UA RARE (*)    All other components within normal limits  LIPASE, BLOOD    EKG None  Radiology Ct Abdomen Pelvis W Contrast  Result Date: 02/25/2018 CLINICAL DATA:  Abdominal pain, nausea/vomiting/diarrhea x2 days EXAM: CT ABDOMEN AND PELVIS WITH CONTRAST TECHNIQUE: Multidetector CT imaging of the abdomen and pelvis was performed using the standard protocol following bolus administration of intravenous contrast. CONTRAST:  151m OMNIPAQUE IOHEXOL 300 MG/ML  SOLN COMPARISON:  08/18/2010 FINDINGS: Lower chest: Left pleural thickening with calcifications. Linear scarring in the left lower lobe. Hepatobiliary: Liver is within normal limits. Layering small gallstones (series 3/image 24), without associated inflammatory changes. No intrahepatic or extrahepatic ductal dilatation. Pancreas: Within normal limits. Spleen: In normal limits. Adrenals/Urinary Tract: Adrenal glands are within normal limits. 4 mm nonobstructing right upper pole renal calculus (series 3/image 35). Two nonobstructing left renal calculi measuring 2-3 mm (series 3/images 31 and 39). No hydronephrosis. Bladder is within normal limits. Stomach/Bowel: Stomach is within normal limits. No evidence of bowel obstruction. Appendix is not discretely visualized. Vascular/Lymphatic: No evidence of abdominal aortic aneurysm. Atherosclerotic calcifications of the abdominal aorta and branch vessels. No suspicious abdominopelvic lymphadenopathy. Reproductive: Prostate is unremarkable. Other: No  abdominopelvic ascites. Musculoskeletal: Degenerative changes of the visualized thoracolumbar spine. Bilateral pars defects at L5-S1. IMPRESSION: No evidence of bowel obstruction. Appendix is not discretely visualized but there are no findings to suggest acute appendicitis. Cholelithiasis, without associated inflammatory changes. Bilateral nonobstructing renal calculi, measuring up to 4 mm in the right upper pole. No hydronephrosis. Additional ancillary findings as above. Electronically Signed   By: SJulian HyM.D.   On: 02/25/2018 20:02    Procedures Procedures (including critical care time)  Medications Ordered in ED Medications  sodium chloride 0.9 % bolus 500 mL (500 mLs Intravenous New Bag/Given 02/25/18 1845)  iohexol (OMNIPAQUE) 300 MG/ML solution 100 mL (100 mLs Intravenous Contrast Given 02/25/18 1925)     Initial Impression / Assessment and Plan / ED Course  I have reviewed the triage vital signs and the nursing notes.  Pertinent labs & imaging results that were available during my care of the patient were reviewed by me and considered in my medical decision making (see chart for details).    MDM  Pt is currently pain free.  Pt is concerned that he wil vomit if he eats.  Pt advised 12 hours of clear liquids, progress slowly to solids.   Final Clinical Impressions(s) / ED Diagnoses   Final diagnoses:  Generalized abdominal pain  Diarrhea, unspecified type    ED Discharge Orders    None    An After Visit Summary was printed and given to the patient.    SFransico Meadow PVermont01/14/20 1659    JOrlie Dakin MD 03/08/18 1355

## 2018-02-27 ENCOUNTER — Encounter: Payer: Self-pay | Admitting: Physical Therapy

## 2018-02-27 ENCOUNTER — Ambulatory Visit: Payer: Managed Care, Other (non HMO) | Admitting: Physical Therapy

## 2018-02-27 DIAGNOSIS — M6281 Muscle weakness (generalized): Secondary | ICD-10-CM | POA: Diagnosis not present

## 2018-02-27 DIAGNOSIS — R2689 Other abnormalities of gait and mobility: Secondary | ICD-10-CM

## 2018-02-27 DIAGNOSIS — R2681 Unsteadiness on feet: Secondary | ICD-10-CM

## 2018-02-27 DIAGNOSIS — R262 Difficulty in walking, not elsewhere classified: Secondary | ICD-10-CM

## 2018-02-27 DIAGNOSIS — R293 Abnormal posture: Secondary | ICD-10-CM

## 2018-02-27 DIAGNOSIS — R42 Dizziness and giddiness: Secondary | ICD-10-CM

## 2018-02-27 NOTE — Patient Instructions (Addendum)
Access Code: [email or web address]  URL: [email or web address]  Date: 02/27/2018  Prepared by: Jamey Reas   Exercises  Seated Gaze Stabilization with Head Rotation - 3 reps - 1 sets - 30 hold - 1x daily - 5x weekly  Seated Gaze Stabilization with Head Nod - 3 reps - 1 sets - 30 hold - 1x daily - 5x weekly  Seated Pelvic Tilts - 10 reps - 1 sets - 1x daily - 5x weekly  Seated Hamstring Stretch - 3 reps - 1 sets - 30 seconds hold - 2x daily - 7x weekly  Seated Gastroc Stretch with Strap - 3 reps - 1 sets - 30 seconds hold - 2x daily - 7x weekly  Seated Hamstring Stretch with Strap - 3 reps - 1 sets - 30 seconds hold - 2x daily - 7x weekly  Seated trunk rotation - 3 reps - 1 sets - 30 seconds hold - 2x dailly - 7x weekly

## 2018-02-28 NOTE — Therapy (Signed)
[facility] [address] [place], [place], [address] Phone: [phone]   Fax:  [phone]  Physical Therapy Treatment  Patient Details  Name: Bobby Lindsey MRN: [id number] Date of Birth: 04/15/1955 Referring Provider (PT): Rhina Brackett, MD   Encounter Date: 02/27/2018  PT End of Session - 02/27/18 1407    Visit Number  17       Number of Visits  30    Authorization Type  [employer] Managed Pt has met VL. Wife reports approved for 10 additional visits for now. Wife & pt plan to apply for [place].     Authorization Time Period  visit limit should reset in 2020 & he wants to continue PT to work on balance & gait with prosthesis    Authorization - Visit Number  3    Authorization - Number of Visits  20    PT Start Time  1322    PT Stop Time  1405    PT Time Calculation (min)  43 min    Activity Tolerance  Patient tolerated treatment well    Behavior During Therapy  WFL for tasks assessed/performed       Past Medical History:  Diagnosis Date  . Anginal pain ([place])    last pm  . Atrial fibrillation ([place])    a. Transient during 03/2013 admission.  . Bradycardia    a. Bradycardia/pauses/possible Mobitz II during 03/2013 adm. Not on BB due to this.  Marland Kitchen CAD (coronary artery disease)    a. s/p CABG 2002. b. Hx Cypher stent to the RCA. c. Inf-lat STEMI 03/2013:  LHC (04/05/13):  mLAD occluded, pD1 90, apical br of Dx occluded, CFX occluded, pOM1 90-95, RCA stents patent, diff RCA 30, S-Dx occluded, S-PDA occluded, S-OM1 40-50, L-LAD patent, EF 40% with inf HK.  PCI:  Promus (2.5 x 28) DES to mid to dist CFX.  Marland Kitchen Charcot's joint of knee   . COPD (chronic obstructive pulmonary disease) ([place])   . Deep venous thrombosis (H[facility])    right lower extremity  . Diabetes mellitus    a. A1C 10.7 in 03/2013.  . Diastolic CHF ([place])    a. EF 40% by cath, 55-60% during 03/2013 adm, required IV diuresis.  . DVT, lower  extremity, recurrent ([place])    a. Hx recurrent DVT per record.  . Dyslipidemia   . Elevated CK    a. Pt has refused rheum workup in the past.  . GERD (gastroesophageal reflux disease)   . History of hiatal hernia   . HTN (hypertension)    x 15 years  . Hx of cardiovascular stress test    a. Lexiscan Myoview (03/2010):  diaph atten vs inf scar, no ischemia, EF 47%; Low Risk.  Marland Kitchen Hx of echocardiogram    a. Echo (04/08/13):  Mild LVH, EF 55-60%, restrictive physiology, severe LAE, mild reduced RVSF, mild RAE.  . Leg pain    ABI 6/16:  R 1.2, L 1.1 - normal  . Myocardial infarction ([place])   . Obesity   . Peripheral neuropathy   . Pulmonary embolism ([place])   . Sleep apnea     Past Surgical History:  Procedure Laterality Date  . CORONARY ANGIOPLASTY WITH STENT PLACEMENT    . CORONARY ARTERY BYPASS GRAFT     4 time since 2002  . LEFT HEART CATHETERIZATION WITH CORONARY/GRAFT ANGIOGRAM  04/05/2013   Procedure: LEFT HEART CATHETERIZATION WITH Beatrix Fetters;  Surgeon: Peter M Martinique,  MD;  Location: [facility] CATH [facility];  Service: Cardiovascular;;  . left knee surgery    . LOWER EXTREMITY ANGIOGRAPHY Left 12/25/2016   Procedure: LOWER EXTREMITY ANGIOGRAPHY;  Surgeon: Algernon Huxley, MD;  Location: [facility] CV [facility];  Service: Cardiovascular;  Laterality: Left;  . PERCUTANEOUS CORONARY STENT INTERVENTION (PCI-S)  04/05/2013   Procedure: PERCUTANEOUS CORONARY STENT INTERVENTION (PCI-S);  Surgeon: Peter M Martinique, MD;  Location: [facility] C[facility];  Service: Cardiovascular;;  DES to native Mid cx  . VASCULAR SURGERY      There were no vitals filed for this visit.  Subjective Assessment - 02/27/18 1323    Subjective  He went to ED with abdominal pain that felt was virus. He seems better.     Pertinent History  L TTA, DM2, perepheral neuropathy, PVD, CAD, CABG X3, CHF, DVTs, HTN, HLD, A-Fib,     Limitations  Standing;House hold activities;Walking    How long can you sit comfortably?  as long as he  wants to sit    How long can you stand comfortably?  unable    How long can you walk comfortably?  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  No/denies    Pain Onset  More than a month ago                       [facility] Adult PT Treatment/Exercise - 02/27/18 1325      Transfers   Transfers  Sit to Stand;Stand to Sit    Sit to Stand  5: Supervision;With upper extremity assist;With armrests;From chair/3-in-1   able to stabilize without UE support   Stand to Sit  5: Supervision;With upper extremity assist;With armrests;To chair/3-in-1      Ambulation/Gait   Ambulation/Gait  Yes    Ambulation/Gait Assistance  5: Supervision    Ambulation/Gait Assistance Details  household no device, limited up to 125' with cane and enter/exit with rollator    Ambulation Distance (Feet)  200 Feet   50' no device, 125' cane & 200' X 2 rollator   Assistive device  Prosthesis;Straight cane;None;Rollator    Gait Pattern  Step-through pattern;Decreased arm swing - left;Decreased step length - right;Decreased stance time - left;Decreased stride length;Decreased hip/knee flexion - left;Decreased weight shift to left;Left flexed knee in stance;Antalgic;Trunk flexed;Abducted - left    Ambulation Surface  Indoor;Level    Ramp  4: Min assist   cane & prosthesis, min guard   Ramp Details (indicate cue type and reason)  verbal cues on posture & wt shift    Curb  5: Supervision   cane & prosthesis, min guard   Curb Details (indicate cue type and reason)  verbal cues on technique, foot position and step thru momentum      Prosthetics   Prosthetic Care Comments   rash appears to be clearing. PT reminded patient that cleaning liner daily, using antiperspirant and drying limb/liner properly will help skin / rash.     Current prosthetic wear tolerance (days/week)   daily    Current prosthetic wear tolerance  (#hours/day)   most of awake hours    Current prosthetic weight-bearing tolerance (hours/day)   Patient tolerates standing & gait with no limb pain. However he experience back pain with gait >100' with cane or less.     Residual limb condition   scab is dry & appears to  be healing. light pink rash under area of liner improve from red, irritated appearance 2 weeks ago.              PT Education - 02/27/18 1340    Education provided  Yes    Education Details  updated HEP,  modifying assistive device to keep LBP from increasing no more than 4 increments: limited household prosthesis only but use cane if fatigued, limited community with cane & household when fatigued and longer community or uneven terrain with rollator walker    Person(s) Educated  Patient    Methods  Explanation;Verbal cues    Comprehension  Verbalized understanding;Verbal cues required;Need further instruction       PT Short Term Goals - 01/31/18 1258      PT SHORT TERM GOAL #1   Title  Patient verbalizes proper skin management for residual limb with use of prosthesis. (All updated STGs Target Date: 03/15/2018)    Time  1    Period  Months    Status  New    Target Date  03/15/18      PT SHORT TERM GOAL #2   Title  Patient reports back pain increases <4 increments on 0-10 scale with gait & standing activities.     Time  1    Period  Months    Status  New    Target Date  03/15/18      PT SHORT TERM GOAL #3   Title  Patient ambulates 74' with cane & prosthesis scanning environment with supervision.     Time  1    Period  Months    Status  New    Target Date  03/15/18      PT SHORT TERM GOAL #4   Title  Patient negotiates ramps, curbs with cane & stairs with 1 rail/cane with prosthesis with minimal assist.     Time  1    Period  Months    Status  Revised    Target Date  03/15/18      PT SHORT TERM GOAL #5   Title  Patient able to stand 2 minutes without UE support safely.     Time  1    Period  Months     Status  New    Target Date  03/15/18        PT Long Term Goals - 01/31/18 1306      PT LONG TERM GOAL #1   Title  Patient verbalizes proper prosthetic care including problem solving new issues and tolerates wear >90% of awake hours without issues. (All updated LTGs Target Date: 04/12/2018)    Time  2    Period  Months    Status  Revised    Target Date  04/12/18      PT LONG TERM GOAL #2   Title  Patient reports back pain increases </= 2 increments on 0-10 scale with standing & gait activities and verbalizes proper use of assistive devices to maximize function without significant pain increases.     Time  2    Period  Months    Status  New    Target Date  04/12/18      PT LONG TERM GOAL #3   Title  Berg Balance >36/56 to indicate lower fall risk & less dependency in standing ADLs.     Time  3    Period  Months    Status  On-going    Target Date  04/12/18  PT LONG TERM GOAL #4   Title  Patient ambulates 200' including paved surfaces with cane & prosthesis modified indepenent.     Time  2    Period  Months    Status  Revised    Target Date  04/12/18      PT LONG TERM GOAL #5   Title  Patient negotiates ramps, curbs & stairs 1 rail with cane & prosthesis modified independent to enable community access.     Time  2    Period  Months    Status  Revised    Target Date  04/12/18            Plan - 02/27/18 1801    Clinical Impression Statement  Today's skilled session focused on maximizing activity level with low back pain management. PT added seated stretches for hamstrings, gastrocs and trunk rotation to HEP.     Rehab Potential  Good    Clinical Impairments Affecting Rehab Potential  weakness BUEs, BLEs & trunk, impaired balance with high fall risk, dependent gait with prosthesis, dependent in proper prosthetic care & use    PT Frequency  2x / week    PT Duration  8 weeks    PT Treatment/Interventions  ADLs/Self Care Home Management;Canalith Repostioning;DME  Instruction;Gait training;Stair training;Functional mobility training;Therapeutic activities;Therapeutic exercise;Balance training;Patient/family education;Neuromuscular re-education;Prosthetic Training;Manual techniques;Vestibular    PT Next Visit Plan  updated HEP with low back exercises, prosthetic gait household no device, limited community cane & longer distances with rollator    PT Home Exercise Plan  Access Code: [email or web address]     Consulted and Agree with Plan of Care  Patient       Patient will benefit from skilled therapeutic intervention in order to improve the following deficits and impairments:  Abnormal gait, Decreased activity tolerance, Decreased balance, Decreased coordination, Decreased endurance, Decreased knowledge of use of DME, Decreased mobility, Decreased range of motion, Decreased strength, Dizziness, Impaired flexibility, Prosthetic Dependency, Postural dysfunction, Pain, Decreased skin integrity  Visit Diagnosis: Muscle weakness (generalized)  Other abnormalities of gait and mobility  Unsteadiness on feet  Dizziness and giddiness  Difficulty in walking, not elsewhere classified  Abnormal posture     Problem List Patient Active Problem List   Diagnosis Date Noted  . SOB (shortness of breath) 12/06/2017  . Uncontrolled type 2 diabetes mellitus with insulin therapy ([place]) 12/06/2017  . Primary osteoarthritis of left knee 11/02/2017  . Open wound 06/05/2017  . Diabetic ulcer of left lower leg ([place]) 05/16/2017  . Acquired absence of left leg below knee ([place]) 02/28/2017  . Impaired mobility and activities of daily living 02/15/2017  . Atherosclerotic heart disease of native coronary artery without angina pectoris 01/05/2017  . HTN (hypertension) 12/19/2016  . Atherosclerosis of native arteries of the extremities with ulceration ([place]) 12/19/2016  . Peripheral vascular disease ([place]) 12/07/2016  . CHF (congestive heart failure) ([place]) 11/24/2016  . Demand ischemia  ([place])   . 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 Stage 3 chronic kidney disease due to diabetes mellitus ([place]) 11/23/2016  . NSTEMI (non-ST elevated myocardial infarction) ([place]) 11/23/2016  . Acute diastolic CHF (congestive heart failure) ([place]) 11/23/2016  . Acute congestive heart failure ([place])   . Burn of left foot 11/21/2016  . Diabetic peripheral neuropathy ([place]) 11/21/2016  . Diabetic ulcer of right foot associated with type 2 diabetes mellitus, with fat layer exposed ([place]) 11/21/2016  . DDD (degenerative disc disease), lumbar 03/27/2016  . History of deep vein thrombosis (  DVT) of lower extremity 03/26/2016  . History of pulmonary embolism 10/01/2015  . Nephropathy, diabetic ([employer]) 10/01/2015  . 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 Chronic obstructive pulmonary disease, unspecified ([place]) 12/16/2013  . Medication management 04/14/2013  . History of recurrent DVT/E 04/11/2013  . Sleep apnea 04/11/2013  . Bradycardia 04/11/2013  . History of Elevated CK level  04/11/2013  . Class 1 obesity due to excess calories with serious comorbidity and body mass index (BMI) of 34.0 to 34.9 in adult 04/11/2013  . Peripheral neuropathy ([place])   . Charcot's arthropathy   . Chronic diastolic heart failure ([place]) 04/08/2013  . Paroxysmal atrial fibrillation ([place]) 04/07/2013  . Reactive depression (situational) 05/16/2011  . Long term (current) use of anticoagulants 09/21/2010  . 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 ED (erectile dysfunction) of organic origin 07/28/2010  . Poorly controlled type 2 diabetes mellitus with peripheral neuropathy ([place]) 05/26/2008  . Hyperlipidemia 05/26/2008  . Hypertensive heart disease 05/26/2008    Jamey Reas PT, DPT 02/28/2018, 6:05 PM  [facility] [address] [place], [place], [address] Phone: [phone]   Fax:  [phone]  Name: Bobby Lindsey MRN:  [id number] Date of Birth: 06-May-1955

## 2018-03-02 ENCOUNTER — Other Ambulatory Visit: Payer: Self-pay | Admitting: Physician Assistant

## 2018-03-02 DIAGNOSIS — I5033 Acute on chronic diastolic (congestive) heart failure: Secondary | ICD-10-CM

## 2018-03-04 ENCOUNTER — Ambulatory Visit: Payer: Managed Care, Other (non HMO) | Admitting: Physical Therapy

## 2018-03-06 ENCOUNTER — Ambulatory Visit: Payer: Managed Care, Other (non HMO) | Admitting: Physical Therapy

## 2018-03-06 ENCOUNTER — Encounter: Payer: Self-pay | Admitting: Physical Therapy

## 2018-03-06 DIAGNOSIS — R42 Dizziness and giddiness: Secondary | ICD-10-CM

## 2018-03-06 DIAGNOSIS — R2681 Unsteadiness on feet: Secondary | ICD-10-CM

## 2018-03-06 DIAGNOSIS — R2689 Other abnormalities of gait and mobility: Secondary | ICD-10-CM

## 2018-03-06 DIAGNOSIS — M6281 Muscle weakness (generalized): Secondary | ICD-10-CM

## 2018-03-06 NOTE — Patient Instructions (Signed)
Access Code: [email or web address]  URL: [email or web address]  Date: 03/06/2018  Prepared by: Jamey Reas   Exercises  Seated Gaze Stabilization with Head Rotation - 3 reps - 1 sets - 30 hold - 1x daily - 5x weekly  Seated Gaze Stabilization with Head Nod - 3 reps - 1 sets - 30 hold - 1x daily - 5x weekly  Seated Pelvic Tilts - 10 reps - 1 sets - 1x daily - 5x weekly  Seated Hamstring Stretch - 3 reps - 1 sets - 30 seconds hold - 2x daily - 7x weekly  Seated Gastroc Stretch with Strap - 3 reps - 1 sets - 30 seconds hold - 2x daily - 7x weekly  Seated Hamstring Stretch with Strap - 3 reps - 1 sets - 30 seconds hold - 2x daily - 7x weekly  Seated Trunk Rotation Stretch - 3 reps - 1 sets - 30 seconds hold - 1x daily - 7x weekly  Sit to Stand with Armchair - 5-10 reps - 1 sets - 5 seconds hold - 1x daily - 5x weekly  Standing Pelvic Tilt - 5-10 reps - 1 sets - 5 seconds hold - 1x daily - 5x weekly

## 2018-03-07 NOTE — Therapy (Signed)
[facility] [address] [place], [place], [address] Phone: [phone]   Fax:  [phone]  Physical Therapy Treatment  Patient Details  Name: Bobby Lindsey MRN: [id number] Date of Birth: 06-Aug-1955 Referring Provider (PT): Rhina Brackett, MD   Encounter Date: 03/06/2018  PT End of Session - 03/06/18 1528    Visit Number  18    Number of Visits  30    Authorization Type  [employer] Managed Pt has met VL. Wife reports approved for 10 additional visits for now. Wife & pt plan to apply for [place].     Authorization Time Period  visit limit should reset in 2020 & he wants to continue PT to work on balance & gait with prosthesis    Authorization - Visit Number  4    Authorization - Number of Visits  20    PT Start Time  1326    PT Stop Time  1400    PT Time Calculation (min)  34 min    Activity Tolerance  Patient tolerated treatment well    Behavior During Therapy  WFL for tasks assessed/performed       Past Medical History:  Diagnosis Date  . Anginal pain ([place])    last pm  . Atrial fibrillation ([place])    a. Transient during 03/2013 admission.  . Bradycardia    a. Bradycardia/pauses/possible Mobitz II during 03/2013 adm. Not on BB due to this.  Marland Kitchen CAD (coronary artery disease)    a. s/p CABG 2002. b. Hx Cypher stent to the RCA. c. Inf-lat STEMI 03/2013:  LHC (04/05/13):  mLAD occluded, pD1 90, apical br of Dx occluded, CFX occluded, pOM1 90-95, RCA stents patent, diff RCA 30, S-Dx occluded, S-PDA occluded, S-OM1 40-50, L-LAD patent, EF 40% with inf HK.  PCI:  Promus (2.5 x 28) DES to mid to dist CFX.  Marland Kitchen Charcot's joint of knee   . COPD (chronic obstructive pulmonary disease) ([place])   . Deep venous thrombosis (H[facility])    right lower extremity  . Diabetes mellitus    a. A1C 10.7 in 03/2013.  . Diastolic CHF ([place])    a. EF 40% by cath, 55-60% during 03/2013 adm, required IV diuresis.  . DVT, lower extremity,  recurrent ([place])    a. Hx recurrent DVT per record.  . Dyslipidemia   . Elevated CK    a. Pt has refused rheum workup in the past.  . GERD (gastroesophageal reflux disease)   . History of hiatal hernia   . HTN (hypertension)    x 15 years  . Hx of cardiovascular stress test    a. Lexiscan Myoview (03/2010):  diaph atten vs inf scar, no ischemia, EF 47%; Low Risk.  Marland Kitchen Hx of echocardiogram    a. Echo (04/08/13):  Mild LVH, EF 55-60%, restrictive physiology, severe LAE, mild reduced RVSF, mild RAE.  . Leg pain    ABI 6/16:  R 1.2, L 1.1 - normal  . Myocardial infarction ([place])   . Obesity   . Peripheral neuropathy   . Pulmonary embolism ([place])   . Sleep apnea     Past Surgical History:  Procedure Laterality Date  . CORONARY ANGIOPLASTY WITH STENT PLACEMENT    . CORONARY ARTERY BYPASS GRAFT     4 time since 2002  . LEFT HEART CATHETERIZATION WITH CORONARY/GRAFT ANGIOGRAM  04/05/2013   Procedure: LEFT HEART CATHETERIZATION WITH Beatrix Fetters;  Surgeon: Peter M Martinique, MD;  Location:  [facility]ATH [facility];  Service: Cardiovascular;;  . left knee surgery    . LOWER EXTREMITY ANGIOGRAPHY Left 12/25/2016   Procedure: LOWER EXTREMITY ANGIOGRAPHY;  Surgeon: Algernon Huxley, MD;  Location: [facility];  Service: Cardiovascular;  Laterality: Left;  . PERCUTANEOUS CORONARY STENT INTERVENTION (PCI-S)  04/05/2013   Procedure: PERCUTANEOUS CORONARY STENT INTERVENTION (PCI-S);  Surgeon: Peter M Martinique, MD;  Location: [facility] CATH LAB;  Service: Cardiovascular;;  DES to native Mid cx  . VASCULAR SURGERY      There were no vitals filed for this visit.  Subjective Assessment - 03/06/18 1333    Subjective  No falls. He has been trying to use the device appropriate to activity that he is doing without increasing his pain significantly.     Pertinent History  L TTA, DM2, perepheral neuropathy, PVD, CAD, CABG X3, CHF, DVTs, HTN, HLD, A-Fib,     Limitations  Standing;House hold activities;Walking    How  long can you sit comfortably?  as long as he wants to sit    How long can you stand comfortably?  unable    How long can you walk comfortably?  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  No/denies    Pain Onset  More than a month ago      Therapeutic Exercise: PT updated & reviewed HEP  Seated Gaze Stabilization with Head Rotation - 3 reps - 1 sets - 30 hold - 1x daily - 5x weekly   Seated Gaze Stabilization with Head Nod - 3 reps - 1 sets - 30 hold - 1x daily - 5x weekly   Seated Pelvic Tilts - 10 reps - 1 sets - 1x daily - 5x weekly   Seated Hamstring Stretch - 3 reps - 1 sets - 30 seconds hold - 2x daily - 7x weekly   Seated Gastroc Stretch with Strap - 3 reps - 1 sets - 30 seconds hold - 2x daily - 7x weekly   Seated Hamstring Stretch with Strap - 3 reps - 1 sets - 30 seconds hold - 2x daily - 7x weekly   Seated Trunk Rotation Stretch - 3 reps - 1 sets - 30 seconds hold - 1x daily - 7x weekly   Sit to Stand with Armchair - 5-10 reps - 1 sets - 5 seconds hold - 1x daily - 5x weekly   Standing Pelvic Tilt - 5-10 reps - 1 sets - 5 seconds hold - 1x daily - 5x weekly                            PT Short Term Goals - 01/31/18 1258      PT SHORT TERM GOAL #1   Title  Patient verbalizes proper skin management for residual limb with use of prosthesis. (All updated STGs Target Date: 03/15/2018)    Time  1    Period  Months    Status  New    Target Date  03/15/18      PT SHORT TERM GOAL #2   Title  Patient reports back pain increases <4 increments on 0-10 scale with gait & standing activities.     Time  1    Period  Months    Status  New    Target Date  03/15/18      PT SHORT  TERM GOAL #3   Title  Patient ambulates 29' with cane & prosthesis scanning environment with supervision.     Time  1    Period  Months    Status  New    Target Date  03/15/18       PT SHORT TERM GOAL #4   Title  Patient negotiates ramps, curbs with cane & stairs with 1 rail/cane with prosthesis with minimal assist.     Time  1    Period  Months    Status  Revised    Target Date  03/15/18      PT SHORT TERM GOAL #5   Title  Patient able to stand 2 minutes without UE support safely.     Time  1    Period  Months    Status  New    Target Date  03/15/18        PT Long Term Goals - 01/31/18 1306      PT LONG TERM GOAL #1   Title  Patient verbalizes proper prosthetic care including problem solving new issues and tolerates wear >90% of awake hours without issues. (All updated LTGs Target Date: 04/12/2018)    Time  2    Period  Months    Status  Revised    Target Date  04/12/18      PT LONG TERM GOAL #2   Title  Patient reports back pain increases </= 2 increments on 0-10 scale with standing & gait activities and verbalizes proper use of assistive devices to maximize function without significant pain increases.     Time  2    Period  Months    Status  New    Target Date  04/12/18      PT LONG TERM GOAL #3   Title  Berg Balance >36/56 to indicate lower fall risk & less dependency in standing ADLs.     Time  3    Period  Months    Status  On-going    Target Date  04/12/18      PT LONG TERM GOAL #4   Title  Patient ambulates 200' including paved surfaces with cane & prosthesis modified indepenent.     Time  2    Period  Months    Status  Revised    Target Date  04/12/18      PT LONG TERM GOAL #5   Title  Patient negotiates ramps, curbs & stairs 1 rail with cane & prosthesis modified independent to enable community access.     Time  2    Period  Months    Status  Revised    Target Date  04/12/18            Plan - 03/06/18 1530    Clinical Impression Statement  Today's skilled session focused on updating HEP.  Patient was late to session & PT discussed how negatively impacts his sessions.     Rehab Potential  Good    Clinical Impairments  Affecting Rehab Potential  weakness BUEs, BLEs & trunk, impaired balance with high fall risk, dependent gait with prosthesis, dependent in proper prosthetic care & use    PT Frequency  2x / week    PT Duration  8 weeks    PT Treatment/Interventions  ADLs/Self Care Home Management;Canalith Repostioning;DME Instruction;Gait training;Stair training;Functional mobility training;Therapeutic activities;Therapeutic exercise;Balance training;Patient/family education;Neuromuscular re-education;Prosthetic Training;Manual techniques;Vestibular    PT Next Visit Plan  verbally check HEP with low back  exercises, prosthetic gait household no device, limited community cane & longer distances with rollator    PT Home Exercise Plan  Access Code: [email or web address]     Consulted and Agree with Plan of Care  Patient       Patient will benefit from skilled therapeutic intervention in order to improve the following deficits and impairments:  Abnormal gait, Decreased activity tolerance, Decreased balance, Decreased coordination, Decreased endurance, Decreased knowledge of use of DME, Decreased mobility, Decreased range of motion, Decreased strength, Dizziness, Impaired flexibility, Prosthetic Dependency, Postural dysfunction, Pain, Decreased skin integrity  Visit Diagnosis: Muscle weakness (generalized)  Other abnormalities of gait and mobility  Unsteadiness on feet  Dizziness and giddiness     Problem List Patient Active Problem List   Diagnosis Date Noted  . SOB (shortness of breath) 12/06/2017  . Uncontrolled type 2 diabetes mellitus with insulin therapy ([place]) 12/06/2017  . Primary osteoarthritis of left knee 11/02/2017  . Open wound 06/05/2017  . Diabetic ulcer of left lower leg ([place]) 05/16/2017  . Acquired absence of left leg below knee ([place]) 02/28/2017  . Impaired mobility and activities of daily living 02/15/2017  . Atherosclerotic heart disease of native coronary artery without angina pectoris 01/05/2017   . HTN (hypertension) 12/19/2016  . Atherosclerosis of native arteries of the extremities with ulceration ([place]) 12/19/2016  . Peripheral vascular disease ([place]) 12/07/2016  . CHF (congestive heart failure) ([place]) 11/24/2016  . Demand ischemia ([place])   . 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 Stage 3 chronic kidney disease due to diabetes mellitus ([place]) 11/23/2016  . NSTEMI (non-ST elevated myocardial infarction) ([place]) 11/23/2016  . Acute diastolic CHF (congestive heart failure) ([place]) 11/23/2016  . Acute congestive heart failure ([place])   . Burn of left foot 11/21/2016  . Diabetic peripheral neuropathy ([place]) 11/21/2016  . Diabetic ulcer of right foot associated with type 2 diabetes mellitus, with fat layer exposed ([place]) 11/21/2016  . DDD (degenerative disc disease), lumbar 03/27/2016  . History of deep vein thrombosis (DVT) of lower extremity 03/26/2016  . History of pulmonary embolism 10/01/2015  . Nephropathy, diabetic ([place]) 10/01/2015  . 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 Chronic obstructive pulmonary disease, unspecified ([place]) 12/16/2013  . Medication management 04/14/2013  . History of recurrent DVT/E 04/11/2013  . Sleep apnea 04/11/2013  . Bradycardia 04/11/2013  . History of Elevated CK level  04/11/2013  . Class 1 obesity due to excess calories with serious comorbidity and body mass index (BMI) of 34.0 to 34.9 in adult 04/11/2013  . Peripheral neuropathy ([place])   . Charcot's arthropathy   . Chronic diastolic heart failure ([place]) 04/08/2013  . Paroxysmal atrial fibrillation ([place]) 04/07/2013  . Reactive depression ([place]) 05/16/2011  . Long term (current) use of anticoagulants 09/21/2010  . 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 ED (erectile dysfunction) of organic origin 07/28/2010  . Poorly controlled type 2 diabetes mellitus with peripheral neuropathy ([place]) 05/26/2008  . Hyperlipidemia 05/26/2008  . Hypertensive heart disease 05/26/2008     Bobby Lindsey PT, DPT 03/07/2018, 3:31 PM  [facility] [address] [place], [place], [address] Phone: [phone]   Fax:  [phone]  Name: Bobby Lindsey MRN: [id number] Date of Birth: 1955-08-01

## 2018-03-11 ENCOUNTER — Encounter: Payer: Self-pay | Admitting: Physical Therapy

## 2018-03-11 ENCOUNTER — Ambulatory Visit: Payer: Managed Care, Other (non HMO) | Admitting: Physical Therapy

## 2018-03-11 DIAGNOSIS — R42 Dizziness and giddiness: Secondary | ICD-10-CM

## 2018-03-11 DIAGNOSIS — M6281 Muscle weakness (generalized): Secondary | ICD-10-CM | POA: Diagnosis not present

## 2018-03-11 DIAGNOSIS — R2689 Other abnormalities of gait and mobility: Secondary | ICD-10-CM

## 2018-03-11 DIAGNOSIS — R2681 Unsteadiness on feet: Secondary | ICD-10-CM

## 2018-03-11 NOTE — Patient Instructions (Signed)
Access Code: [facility]  URL: [email or web address]  Date: 03/11/2018  Prepared by: Jamey Reas   Program Notes  Swiss or Therapy ball that is 75cm slightly underinflated. Place ball in front of couch or recliner with 2 kitchen chairs on each side.  Start each exercise holding onto chairs at sides and then progress to not holding onto chairs.    Exercises  Pelvic Tilt on Swiss Ball - 10 reps - 1 sets - 5 seconds hold - 1x daily - 5x weekly  Seated Lateral Pelvic Tilt on Swiss Ball - 10 reps - 1 sets - 5 seconds hold - 1x daily - 5x weekly  Pelvic Circles on Swiss Ball - 10 reps - 1 sets - 5 seconds hold - 1x daily - 5x weekly  Swiss Ball March - 10 reps - 1 sets - 5 seconds hold - 1x daily - 5x weekly  Swiss Ball forward step - 10 reps - 1 sets - 5 seconds hold - 1x daily - 5x weekly  Swiss ball side step seated - 10 reps - 1 sets - 5 seconds hold - 1x daily - 5x weekly

## 2018-03-11 NOTE — Therapy (Signed)
[facility] [address] [place], [place], [address] Phone: [phone]   Fax:  [phone]  Physical Therapy Treatment  Patient Details  Name: Bobby Lindsey MRN: [id number] Date of Birth: Sep 23, 1955 Referring Provider (PT): Rhina Brackett, MD   Encounter Date: 03/11/2018  PT End of Session - 03/11/18 1403    Visit Number  19    Number of Visits  30    Authorization Type  [employer] Managed Pt has met VL. Wife reports approved for 10 additional visits for now. Wife & pt plan to apply for [place].     Authorization Time Period  visit limit should reset in 2020 & he wants to continue PT to work on balance & gait with prosthesis    Authorization - Visit Number  5    Authorization - Number of Visits  20    PT Start Time  1315    PT Stop Time  1400    PT Time Calculation (min)  45 min    Activity Tolerance  Patient tolerated treatment well    Behavior During Therapy  WFL for tasks assessed/performed       Past Medical History:  Diagnosis Date  . Anginal pain ([place])    last pm  . Atrial fibrillation ([place])    a. Transient during 03/2013 admission.  . Bradycardia    a. Bradycardia/pauses/possible Mobitz II during 03/2013 adm. Not on BB due to this.  Marland Kitchen CAD (coronary artery disease)    a. s/p CABG 2002. b. Hx Cypher stent to the RCA. c. Inf-lat STEMI 03/2013:  LHC (04/05/13):  mLAD occluded, pD1 90, apical br of Dx occluded, CFX occluded, pOM1 90-95, RCA stents patent, diff RCA 30, S-Dx occluded, S-PDA occluded, S-OM1 40-50, L-LAD patent, EF 40% with inf HK.  PCI:  Promus (2.5 x 28) DES to mid to dist CFX.  Marland Kitchen Charcot's joint of knee   . COPD (chronic obstructive pulmonary disease) ([place])   . Deep venous thrombosis (H[facility])    right lower extremity  . Diabetes mellitus    a. A1C 10.7 in 03/2013.  . Diastolic CHF ([place])    a. EF 40% by cath, 55-60% during 03/2013 adm, required IV diuresis.  . DVT, lower extremity,  recurrent ([place])    a. Hx recurrent DVT per record.  . Dyslipidemia   . Elevated CK    a. Pt has refused rheum workup in the past.  . GERD (gastroesophageal reflux disease)   . History of hiatal hernia   . HTN (hypertension)    x 15 years  . Hx of cardiovascular stress test    a. Lexiscan Myoview (03/2010):  diaph atten vs inf scar, no ischemia, EF 47%; Low Risk.  Marland Kitchen Hx of echocardiogram    a. Echo (04/08/13):  Mild LVH, EF 55-60%, restrictive physiology, severe LAE, mild reduced RVSF, mild RAE.  . Leg pain    ABI 6/16:  R 1.2, L 1.1 - normal  . Myocardial infarction ([place])   . Obesity   . Peripheral neuropathy   . Pulmonary embolism ([place])   . Sleep apnea     Past Surgical History:  Procedure Laterality Date  . CORONARY ANGIOPLASTY WITH STENT PLACEMENT    . CORONARY ARTERY BYPASS GRAFT     4 time since 2002  . LEFT HEART CATHETERIZATION WITH CORONARY/GRAFT ANGIOGRAM  04/05/2013   Procedure: LEFT HEART CATHETERIZATION WITH Beatrix Fetters;  Surgeon: Peter M Martinique, MD;  Location:  [facility] CATH LAB;  Service: Cardiovascular;;  . left knee surgery    . LOWER EXTREMITY ANGIOGRAPHY Left 12/25/2016   Procedure: LOWER EXTREMITY ANGIOGRAPHY;  Surgeon: Algernon Huxley, MD;  Location: [facility] CV LAB;  Service: Cardiovascular;  Laterality: Left;  . PERCUTANEOUS CORONARY STENT INTERVENTION (PCI-S)  04/05/2013   Procedure: PERCUTANEOUS CORONARY STENT INTERVENTION (PCI-S);  Surgeon: Peter M Martinique, MD;  Location: [facility] CATH LAB;  Service: Cardiovascular;;  DES to native Mid cx  . VASCULAR SURGERY      There were no vitals filed for this visit.  Subjective Assessment - 03/11/18 1315    Subjective  He used rollator to go hunting with family but mainly sitting near truck. No falls. He has been doing the exercises some when sitting.     Pertinent History  L TTA, DM2, perepheral neuropathy, PVD, CAD, CABG X3, CHF, DVTs, HTN, HLD, A-Fib,     Limitations  Standing;House hold activities;Walking     How long can you sit comfortably?  as long as he wants to sit    How long can you stand comfortably?  unable    How long can you walk comfortably?  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  Yes    Pain Score  5     Pain Location  Back    Pain Orientation  Lower;Mid    Pain Descriptors / Indicators  Aching    Pain Type  Chronic pain    Pain Onset  More than a month ago    Pain Frequency  Intermittent    Aggravating Factors   walking too far with cane    Pain Relieving Factors  sitting down, resting       Standing balance in parallel bars with intermittent UE support with tactile, visual & verbal cues: rocker board right/left & anterior/posterior stabilization for 30 seconds & wt shifts/stabilization/recovery 5 reps ea.  PT instructed in safe set-up & exercises with 75cm Swiss ball Access Code: [email or web address]  URL: [email or web address]  Date: 03/11/2018  Prepared by: Jamey Reas   Program Notes  Swiss or Therapy ball that is 75cm slightly underinflated. Place ball in front of couch or recliner with 2 kitchen chairs on each side.  Start each exercise holding onto chairs at sides and then progress to not holding onto chairs.    Exercises  Pelvic Tilt on Swiss Ball - 10 reps - 1 sets - 5 seconds hold - 1x daily - 5x weekly  Seated Lateral Pelvic Tilt on Swiss Ball - 10 reps - 1 sets - 5 seconds hold - 1x daily - 5x weekly  Pelvic Circles on Swiss Ball - 10 reps - 1 sets - 5 seconds hold - 1x daily - 5x weekly  Swiss Ball March - 10 reps - 1 sets - 5 seconds hold - 1x daily - 5x weekly  Swiss Ball forward step - 10 reps - 1 sets - 5 seconds hold - 1x daily - 5x weekly  Swiss ball side step seated - 10 reps - 1 sets - 5 seconds hold - 1x daily - 5x weekly                           PT Short Term Goals - 03/11/18 2230      PT SHORT TERM GOAL #1   Title  Patient verbalizes proper skin management for residual limb with use of prosthesis. (All updated STGs Target Date: 03/15/2018)    Baseline  MET 03/11/2018    Time  1    Period  Months    Status  Achieved      PT SHORT TERM GOAL #2   Title  Patient reports back pain increases <4 increments on 0-10 scale with gait & standing activities.     Time  1    Period  Months    Status  On-going    Target Date  03/15/18      PT SHORT TERM GOAL #3   Title  Patient ambulates 91' with cane & prosthesis scanning environment with supervision.     Time  1    Period  Months    Status  On-going    Target Date  03/15/18      PT SHORT TERM GOAL #4   Title  Patient negotiates ramps, curbs with cane & stairs with 1 rail/cane with prosthesis with minimal assist.     Time  1    Period  Months    Status  On-going    Target Date  03/15/18      PT SHORT TERM GOAL #5   Title  Patient able to stand 2 minutes without UE support safely.     Baseline  MET 03/11/2018    Time  1    Period  Months    Status  Achieved        PT Long Term Goals - 03/11/18 2232      PT LONG TERM GOAL #1   Title  Patient verbalizes proper prosthetic care including problem solving new issues and tolerates wear >90% of awake hours without issues. (All updated LTGs Target Date: 04/12/2018)    Time  2    Period  Months    Status  On-going    Target Date  04/12/18      PT LONG TERM GOAL #2   Title  Patient reports back pain increases </= 2 increments on 0-10 scale with standing & gait activities and verbalizes proper use of assistive devices to maximize function without significant pain increases.     Time  2    Period  Months    Status  On-going    Target Date  04/12/18      PT LONG TERM GOAL #3   Title  Berg Balance >36/56 to indicate lower fall risk & less dependency in standing ADLs.     Time  3    Period  Months    Status  On-going    Target Date  04/12/18      PT LONG TERM GOAL #4   Title  Patient ambulates 200'  including paved surfaces with cane & prosthesis modified indepenent.     Time  2    Period  Months    Status  On-going    Target Date  04/12/18      PT LONG TERM GOAL #5   Title  Patient negotiates ramps, curbs & stairs 1 rail with cane & prosthesis modified independent to enable community access.     Time  2    Period  Months    Status  On-going    Target Date  04/12/18            Plan - 03/11/18 2233    Clinical Impression Statement  Today's skilled session focused on therapeutic activities & exercises with  Swiss Ball improving balance, strength & spinal flexibility.     Rehab Potential  Good    Clinical Impairments Affecting Rehab Potential  weakness BUEs, BLEs & trunk, impaired balance with high fall risk, dependent gait with prosthesis, dependent in proper prosthetic care & use    PT Frequency  2x / week    PT Duration  8 weeks    PT Treatment/Interventions  ADLs/Self Care Home Management;Canalith Repostioning;DME Instruction;Gait training;Stair training;Functional mobility training;Therapeutic activities;Therapeutic exercise;Balance training;Patient/family education;Neuromuscular re-education;Prosthetic Training;Manual techniques;Vestibular    PT Next Visit Plan  check remaining STGs, verbally check HEP with low back exercises, prosthetic gait household no device, limited community cane & longer distances with rollator    PT Home Exercise Plan  Access Code: [email or web address]     Consulted and Agree with Plan of Care  Patient       Patient will benefit from skilled therapeutic intervention in order to improve the following deficits and impairments:  Abnormal gait, Decreased activity tolerance, Decreased balance, Decreased coordination, Decreased endurance, Decreased knowledge of use of DME, Decreased mobility, Decreased range of motion, Decreased strength, Dizziness, Impaired flexibility, Prosthetic Dependency, Postural dysfunction, Pain, Decreased skin integrity  Visit  Diagnosis: Muscle weakness (generalized)  Other abnormalities of gait and mobility  Unsteadiness on feet  Dizziness and giddiness     Problem List Patient Active Problem List   Diagnosis Date Noted  . SOB (shortness of breath) 12/06/2017  . Uncontrolled type 2 diabetes mellitus with insulin therapy ([place]) 12/06/2017  . Primary osteoarthritis of left knee 11/02/2017  . Open wound 06/05/2017  . Diabetic ulcer of left lower leg ([place]) 05/16/2017  . Acquired absence of left leg below knee ([place]) 02/28/2017  . Impaired mobility and activities of daily living 02/15/2017  . Atherosclerotic heart disease of native coronary artery without angina pectoris 01/05/2017  . HTN (hypertension) 12/19/2016  . Atherosclerosis of native arteries of the extremities with ulceration ([place]) 12/19/2016  . Peripheral vascular disease ([place]) 12/07/2016  . CHF (congestive heart failure) ([place]) 11/24/2016  . Demand ischemia ([place])   . 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 Stage 3 chronic kidney disease due to diabetes mellitus ([place]) 11/23/2016  . NSTEMI (non-ST elevated myocardial infarction) ([place]) 11/23/2016  . Acute diastolic CHF (congestive heart failure) ([place]) 11/23/2016  . Acute congestive heart failure ([place])   . Burn of left foot 11/21/2016  . Diabetic peripheral neuropathy ([place]) 11/21/2016  . Diabetic ulcer of right foot associated with type 2 diabetes mellitus, with fat layer exposed ([place]) 11/21/2016  . DDD (degenerative disc disease), lumbar 03/27/2016  . History of deep vein thrombosis (DVT) of lower extremity 03/26/2016  . History of pulmonary embolism 10/01/2015  . Nephropathy, diabetic ([place]) 10/01/2015  . 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 Chronic obstructive pulmonary disease, unspecified ([place]) 12/16/2013  . Medication management 04/14/2013  . History of recurrent DVT/E 04/11/2013  . Sleep apnea 04/11/2013  . Bradycardia 04/11/2013  . History of  Elevated CK level  04/11/2013  . Class 1 obesity due to excess calories with serious comorbidity and body mass index (BMI) of 34.0 to 34.9 in adult 04/11/2013  . Peripheral neuropathy ([place])   . Charcot's arthropathy   . Chronic diastolic heart failure ([place]) 04/08/2013  . Paroxysmal atrial fibrillation ([place]) 04/07/2013  . Reactive depression ([place]) 05/16/2011  . Long term (current) use of anticoagulants 09/21/2010  . 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 ED (erectile dysfunction) of organic origin 07/28/2010  . Poorly controlled type 2 diabetes mellitus  with peripheral neuropathy ([place]) 05/26/2008  . Hyperlipidemia 05/26/2008  . Hypertensive heart disease 05/26/2008    Jamey Reas PT, DPT 03/11/2018, 10:35 PM  [facility] [address] [place], [place], [address] Phone: [phone]   Fax:  [phone]  Name: Bobby Lindsey MRN: [id number] Date of Birth: [birth year]

## 2018-03-13 ENCOUNTER — Ambulatory Visit: Payer: Managed Care, Other (non HMO) | Admitting: Physical Therapy

## 2018-03-16 ENCOUNTER — Other Ambulatory Visit: Payer: Self-pay | Admitting: Cardiology

## 2018-03-18 ENCOUNTER — Encounter: Payer: Self-pay | Admitting: Physical Therapy

## 2018-03-18 ENCOUNTER — Ambulatory Visit: Payer: Managed Care, Other (non HMO) | Attending: Physical Medicine & Rehabilitation | Admitting: Physical Therapy

## 2018-03-18 DIAGNOSIS — R2689 Other abnormalities of gait and mobility: Secondary | ICD-10-CM

## 2018-03-18 DIAGNOSIS — R2681 Unsteadiness on feet: Secondary | ICD-10-CM | POA: Diagnosis present

## 2018-03-18 DIAGNOSIS — R42 Dizziness and giddiness: Secondary | ICD-10-CM | POA: Diagnosis present

## 2018-03-18 DIAGNOSIS — M6281 Muscle weakness (generalized): Secondary | ICD-10-CM | POA: Diagnosis present

## 2018-03-18 DIAGNOSIS — R293 Abnormal posture: Secondary | ICD-10-CM | POA: Diagnosis present

## 2018-03-18 NOTE — Therapy (Signed)
[facility] [address] [place], [place], [address] Phone: [phone]   Fax:  [phone]  Physical Therapy Treatment  Patient Details  Name: Bobby Lindsey MRN: [id number] Date of Birth: [birth year] Referring Provider (PT): Rhina Brackett, MD   Encounter Date: 03/18/2018  PT End of Session - 03/18/18 1323    Visit Number  20    Number of Visits  30    Authorization Type  [employer] Managed Pt has met VL. Wife reports approved for 10 additional visits for now. Wife & pt plan to apply for [facility].     Authorization Time Period  visit limit should reset in 2020 & he wants to continue PT to work on balance & gait with prosthesis    Authorization - Visit Number  6    Authorization - Number of Visits  20    PT Start Time  1318    PT Stop Time  1400    PT Time Calculation (min)  42 min    Equipment Utilized During Treatment  Gait belt    Activity Tolerance  Patient tolerated treatment well;Patient limited by pain;Patient limited by fatigue    Behavior During Therapy  [facility] for tasks assessed/performed       Past Medical History:  Diagnosis Date  . Anginal pain ([place])    last pm  . Atrial fibrillation ([name])    a. Transient during 03/2013 admission.  . Bradycardia    a. Bradycardia/pauses/possible Mobitz II during 03/2013 adm. Not on BB due to this.  Marland Kitchen CAD (coronary artery disease)    a. s/p CABG 2002. b. Hx Cypher stent to the RCA. c. Inf-lat STEMI 03/2013:  LHC (04/05/13):  mLAD occluded, pD1 90, apical br of Dx occluded, CFX occluded, pOM1 90-95, RCA stents patent, diff RCA 30, S-Dx occluded, S-PDA occluded, S-OM1 40-50, L-LAD patent, EF 40% with inf HK.  PCI:  Promus (2.5 x 28) DES to mid to dist CFX.  Marland Kitchen Charcot's joint of knee   . COPD (chronic obstructive pulmonary disease) ([place])   . Deep venous thrombosis (HCC)    right lower extremity  . Diabetes mellitus    a. A1C 10.7 in 03/2013.  . Diastolic CHF  ([place])    a. EF 40% by cath, 55-60% during 03/2013 adm, required IV diuresis.  . DVT, lower extremity, recurrent ([place])    a. Hx recurrent DVT per record.  . Dyslipidemia   . Elevated CK    a. Pt has refused rheum workup in the past.  . GERD (gastroesophageal reflux disease)   . History of hiatal hernia   . HTN (hypertension)    x 15 years  . Hx of cardiovascular stress test    a. Lexiscan Myoview (03/2010):  diaph atten vs inf scar, no ischemia, EF 47%; Low Risk.  Marland Kitchen Hx of echocardiogram    a. Echo (04/08/13):  Mild LVH, EF 55-60%, restrictive physiology, severe LAE, mild reduced RVSF, mild RAE.  . Leg pain    ABI 6/16:  R 1.2, L 1.1 - normal  . Myocardial infarction ([place])   . Obesity   . Peripheral neuropathy   . Pulmonary embolism ([place])   . Sleep apnea     Past Surgical History:  Procedure Laterality Date  . CORONARY ANGIOPLASTY WITH STENT PLACEMENT    . CORONARY ARTERY BYPASS GRAFT     4 time since 2002  . LEFT HEART CATHETERIZATION WITH CORONARY/GRAFT ANGIOGRAM  04/05/2013  Procedure: LEFT HEART CATHETERIZATION WITH Beatrix Fetters;  Surgeon: Peter M Martinique, MD;  Location: [facility] CATH LAB;  Service: Cardiovascular;;  . left knee surgery    . LOWER EXTREMITY ANGIOGRAPHY Left 12/25/2016   Procedure: LOWER EXTREMITY ANGIOGRAPHY;  Surgeon: Algernon Huxley, MD;  Location: [facility] CV LAB;  Service: Cardiovascular;  Laterality: Left;  . PERCUTANEOUS CORONARY STENT INTERVENTION (PCI-S)  04/05/2013   Procedure: PERCUTANEOUS CORONARY STENT INTERVENTION (PCI-S);  Surgeon: Peter M Martinique, MD;  Location: [facility] CATH LAB;  Service: Cardiovascular;;  DES to native Mid cx  . VASCULAR SURGERY      There were no vitals filed for this visit.  Subjective Assessment - 03/18/18 1321    Subjective  No new complaints. No falls. Did have increased residual limb pain on Saturday "hurt all day long". Stopped over night when he was asleep, woke up on Sunday with it gone.     Pertinent History  L  TTA, DM2, perepheral neuropathy, PVD, CAD, CABG X3, CHF, DVTs, HTN, HLD, A-Fib,     Limitations  Standing;House hold activities;Walking    How long can you sit comfortably?  as long as he wants to sit    How long can you stand comfortably?  unable    How long can you walk comfortably?  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  Yes    Pain Score  5     Pain Location  Back    Pain Orientation  Lower;Mid    Pain Descriptors / Indicators  Aching    Pain Type  Chronic pain    Pain Onset  More than a month ago    Pain Frequency  Intermittent    Aggravating Factors   increased walking/activity    Pain Relieving Factors  sitting down,resting            [facility] Adult PT Treatment/Exercise - 03/18/18 1325      Transfers   Transfers  Sit to Stand;Stand to Sit    Sit to Stand  5: Supervision;With upper extremity assist;With armrests;From chair/3-in-1    Stand to Sit  5: Supervision;With upper extremity assist;With armrests;To chair/3-in-1      Ambulation/Gait   Ambulation/Gait  Yes    Ambulation Distance (Feet)  70 Feet   x2 with cane   Assistive device  Rollator;Straight cane;Prosthesis   cane with rubber quad tip   Gait Pattern  Step-through pattern;Decreased arm swing - left;Decreased step length - right;Decreased stance time - left;Decreased stride length;Decreased hip/knee flexion - left;Decreased weight shift to left;Left flexed knee in stance;Antalgic;Trunk flexed;Abducted - left    Ambulation Surface  Level;Indoor    Stairs  Yes    Stairs Assistance  5: Supervision    Stairs Assistance Details (indicate cue type and reason)  reminder cues for weight shifting, cane placement and to advance hand on rail.     Stair Management Technique  One rail Right;One rail Left;Step to pattern;Forwards;With cane    Number of Stairs  4   x2 reps   Ramp  Other (comment)   min guard assist   Ramp  Details (indicate cue type and reason)  reminder cues on technique, step length and weight shifting with cane/prosthesis    Curb  Other (comment);4: Min assist   min guard assist   Curb Details (indicate cue type and reason)  reminder cues on sequencing and  stance upon descending/ascending with cane/prosthesis; HHA with cane needed to ascend.       Self-Care   Self-Care  Other Self-Care Comments    Other Self-Care Comments   pt stating he want to loose weight to help his back pain. discussed fitness center options and ensuring to choose one that has trainers that can assist him/monitor him due to his cardiac history. He is going to call his insurace to see if they cover any fitness plans. Also discussed diet as he should be following a cardiac diet, however had a country fried biscuit on the way to therapy. Pt verbalizes understanding of what he should be doing. Also discussed daily weights to monitor himself for fluid overload due to h/o CHF and being in hospital in the past for this.       Knee/Hip Exercises: Aerobic   Other Aerobic  Scifit with LE's/left UE (no right due to shoulder issues) on Level 2.0 for 8 minutes with goal >/= 65 rpm for strengthening and activity tolerance. SaO2 95%, HR 64 before and 91%, HR 66 after. quick increase to 95% with seated rest break (1-2 minutes).       Prosthetics   Current prosthetic wear tolerance (days/week)   daily    Current prosthetic wear tolerance (#hours/day)   most of awake hours    Residual limb condition   scab is dry, intact on incision line; otherwise no issues, some reddness from heat, no sweat noted.     Donning Prosthesis  Modified independent (device/increased time)    Doffing Prosthesis  Modified independent (device/increased time)               PT Short Term Goals - 03/18/18 1325      PT SHORT TERM GOAL #1   Title  Patient verbalizes proper skin management for residual limb with use of prosthesis. (All updated STGs Target  Date: 03/15/2018)    Baseline  MET 03/11/2018    Status  Achieved      PT SHORT TERM GOAL #2   Title  Patient reports back pain increases <4 increments on 0-10 scale with gait & standing activities.     Baseline  03/18/18: met with activity in session today    Status  Achieved      PT SHORT TERM GOAL #3   Title  Patient ambulates 9' with cane & prosthesis scanning environment with supervision.     Baseline  03/18/18: pt needed up to min guard to min assist and was not able to meet the distance due to back pain.     Status  Not Met      PT SHORT TERM GOAL #4   Title  Patient negotiates ramps, curbs with cane & stairs with 1 rail/cane with prosthesis with minimal assist.     Baseline  03/18/18: met today with cane/prosthesis    Status  Achieved      PT SHORT TERM GOAL #5   Title  Patient able to stand 2 minutes without UE support safely.     Baseline  MET 03/11/2018    Status  Achieved        PT Long Term Goals - 03/11/18 2232      PT LONG TERM GOAL #1   Title  Patient verbalizes proper prosthetic care including problem solving new issues and tolerates wear >90% of awake hours without issues. (All updated LTGs Target Date: 04/12/2018)    Time  2    Period  Months  Status  On-going    Target Date  04/12/18      PT LONG TERM GOAL #2   Title  Patient reports back pain increases </= 2 increments on 0-10 scale with standing & gait activities and verbalizes proper use of assistive devices to maximize function without significant pain increases.     Time  2    Period  Months    Status  On-going    Target Date  04/12/18      PT LONG TERM GOAL #3   Title  Berg Balance >36/56 to indicate lower fall risk & less dependency in standing ADLs.     Time  3    Period  Months    Status  On-going    Target Date  04/12/18      PT LONG TERM GOAL #4   Title  Patient ambulates 200' including paved surfaces with cane & prosthesis modified indepenent.     Time  2    Period  Months    Status   On-going    Target Date  04/12/18      PT LONG TERM GOAL #5   Title  Patient negotiates ramps, curbs & stairs 1 rail with cane & prosthesis modified independent to enable community access.     Time  2    Period  Months    Status  On-going    Target Date  04/12/18            Plan - 03/18/18 1324    Clinical Impression Statement  Today's skilled session finished looking at progress toward STGs with 2/3 goals checked met, other goal not met due to back pain with gait with cane limiting distance. Remainder of session focused on strengthening and activity tolerance with education on use of Scifit recumbant bike and community places to use one. The pt should benefit from continued PT to progress toward unmet goals.                                         Rehab Potential  Good    Clinical Impairments Affecting Rehab Potential  weakness BUEs, BLEs & trunk, impaired balance with high fall risk, dependent gait with prosthesis, dependent in proper prosthetic care & use    PT Frequency  2x / week    PT Duration  8 weeks    PT Treatment/Interventions  ADLs/Self Care Home Management;Canalith Repostioning;DME Instruction;Gait training;Stair training;Functional mobility training;Therapeutic activities;Therapeutic exercise;Balance training;Patient/family education;Neuromuscular re-education;Prosthetic Training;Manual techniques;Vestibular    PT Next Visit Plan  prosthetic gait household no device, limited community cane & longer distances with rollator    PT Home Exercise Plan  Access Code: [email or web address]     Consulted and Agree with Plan of Care  Patient       Patient will benefit from skilled therapeutic intervention in order to improve the following deficits and impairments:  Abnormal gait, Decreased activity tolerance, Decreased balance, Decreased coordination, Decreased endurance, Decreased knowledge of use of DME, Decreased mobility, Decreased range of motion, Decreased strength, Dizziness, Impaired  flexibility, Prosthetic Dependency, Postural dysfunction, Pain, Decreased skin integrity  Visit Diagnosis: Muscle weakness (generalized)  Other abnormalities of gait and mobility  Unsteadiness on feet     Problem List Patient Active Problem List   Diagnosis Date Noted  . SOB (shortness of breath) 12/06/2017  . Uncontrolled type 2 diabetes mellitus with insulin therapy ([place])  12/06/2017  . Primary osteoarthritis of left knee 11/02/2017  . Open wound 06/05/2017  . Diabetic ulcer of left lower leg ([place]) 05/16/2017  . Acquired absence of left leg below knee ([place]) 02/28/2017  . Impaired mobility and activities of daily living 02/15/2017  . Atherosclerotic heart disease of native coronary artery without angina pectoris 01/05/2017  . HTN (hypertension) 12/19/2016  . Atherosclerosis of native arteries of the extremities with ulceration ([place]) 12/19/2016  . Peripheral vascular disease ([place]) 12/07/2016  . CHF (congestive heart failure) ([place]) 11/24/2016  . Demand ischemia ([place])   . 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 Stage 3 chronic kidney disease due to diabetes mellitus ([place]) 11/23/2016  . NSTEMI (non-ST elevated myocardial infarction) ([place]) 11/23/2016  . Acute diastolic CHF (congestive heart failure) ([place]) 11/23/2016  . Acute congestive heart failure ([place])   . Burn of left foot 11/21/2016  . Diabetic peripheral neuropathy ([place]) 11/21/2016  . Diabetic ulcer of right foot associated with type 2 diabetes mellitus, with fat layer exposed ([place]) 11/21/2016  . DDD (degenerative disc disease), lumbar 03/27/2016  . History of deep vein thrombosis (DVT) of lower extremity 03/26/2016  . History of pulmonary embolism 10/01/2015  . Nephropathy, diabetic ([place]) 10/01/2015  . 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 Chronic obstructive pulmonary disease, unspecified ([place]) 12/16/2013  . Medication management 04/14/2013  . History of recurrent DVT/E 04/11/2013   . Sleep apnea 04/11/2013  . Bradycardia 04/11/2013  . History of Elevated CK level  04/11/2013  . Class 1 obesity due to excess calories with serious comorbidity and body mass index (BMI) of 34.0 to 34.9 in adult 04/11/2013  . Peripheral neuropathy ([place])   . Charcot's arthropathy   . Chronic diastolic heart failure ([place]) 04/08/2013  . Paroxysmal atrial fibrillation ([place]) 04/07/2013  . Reactive depression (situational) 05/16/2011  . Long term (current) use of anticoagulants 09/21/2010  . 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 ED (erectile dysfunction) of organic origin 07/28/2010  . Poorly controlled type 2 diabetes mellitus with peripheral neuropathy ([place]) 05/26/2008  . Hyperlipidemia 05/26/2008  . Hypertensive heart disease 05/26/2008    Willow Ora, PTA, [facility] [address], [place], [place] [address] [phone] 03/18/18, 3:25 PM   Name: DAGOBERTO NEALY MRN: [id number] Date of Birth: 1955-04-15

## 2018-03-18 NOTE — Telephone Encounter (Signed)
Call pt left msg stating that refill will not be authorized until checked inr or scheduled for an appt and will only give the amount needed to get pt to that appt told pt to call [place] office

## 2018-03-20 ENCOUNTER — Telehealth: Payer: Self-pay | Admitting: Cardiology

## 2018-03-20 ENCOUNTER — Encounter: Payer: Self-pay | Admitting: Physical Therapy

## 2018-03-20 ENCOUNTER — Ambulatory Visit (INDEPENDENT_AMBULATORY_CARE_PROVIDER_SITE_OTHER): Payer: Managed Care, Other (non HMO)

## 2018-03-20 ENCOUNTER — Ambulatory Visit: Payer: Managed Care, Other (non HMO) | Admitting: Physical Therapy

## 2018-03-20 DIAGNOSIS — I480 Paroxysmal atrial fibrillation: Secondary | ICD-10-CM

## 2018-03-20 DIAGNOSIS — I2119 ST elevation (STEMI) myocardial infarction involving other coronary artery of inferior wall: Secondary | ICD-10-CM | POA: Diagnosis not present

## 2018-03-20 DIAGNOSIS — R2689 Other abnormalities of gait and mobility: Secondary | ICD-10-CM

## 2018-03-20 DIAGNOSIS — Z5181 Encounter for therapeutic drug level monitoring: Secondary | ICD-10-CM

## 2018-03-20 DIAGNOSIS — Z86711 Personal history of pulmonary embolism: Secondary | ICD-10-CM

## 2018-03-20 DIAGNOSIS — Z86718 Personal history of other venous thrombosis and embolism: Secondary | ICD-10-CM | POA: Diagnosis not present

## 2018-03-20 DIAGNOSIS — M6281 Muscle weakness (generalized): Secondary | ICD-10-CM

## 2018-03-20 DIAGNOSIS — R2681 Unsteadiness on feet: Secondary | ICD-10-CM

## 2018-03-20 DIAGNOSIS — Z7901 Long term (current) use of anticoagulants: Secondary | ICD-10-CM

## 2018-03-20 LAB — POCT INR: INR: 1.1 — AB (ref 2.0–3.0)

## 2018-03-20 MED ORDER — WARFARIN SODIUM 5 MG PO TABS: ORAL_TABLET | ORAL | 0 refills | Status: DC

## 2018-03-20 NOTE — Therapy (Signed)
[facility] [address] [place], [place], [address] Phone: [phone]   Fax:  [phone]  Physical Therapy Treatment  Patient Details  Name: Bobby Lindsey MRN: [id number] Date of Birth: [birth year] Referring Provider (PT): Rhina Brackett, MD   Encounter Date: 03/20/2018  PT End of Session - 03/20/18 1239    Visit Number  21    Number of Visits  30    Authorization Type  [employer] Managed Pt has met VL. Wife reports approved for 10 additional visits for now. Wife & pt plan to apply for [place].     Authorization Time Period  visit limit should reset in 2020 & he wants to continue PT to work on balance & gait with prosthesis    Authorization - Visit Number  6    Authorization - Number of Visits  20    PT Start Time  1147    PT Stop Time  1234    PT Time Calculation (min)  47 min    Equipment Utilized During Treatment  Gait belt    Activity Tolerance  Patient tolerated treatment well;Patient limited by pain;Patient limited by fatigue    Behavior During Therapy  [facility] for tasks assessed/performed       Past Medical History:  Diagnosis Date  . Anginal pain ([place])    last pm  . Atrial fibrillation ([place])    a. Transient during 03/2013 admission.  . Bradycardia    a. Bradycardia/pauses/possible Mobitz II during 03/2013 adm. Not on BB due to this.  Marland Kitchen CAD (coronary artery disease)    a. s/p CABG 2002. b. Hx Cypher stent to the RCA. c. Inf-lat STEMI 03/2013:  LHC (04/05/13):  mLAD occluded, pD1 90, apical br of Dx occluded, CFX occluded, pOM1 90-95, RCA stents patent, diff RCA 30, S-Dx occluded, S-PDA occluded, S-OM1 40-50, L-LAD patent, EF 40% with inf HK.  PCI:  Promus (2.5 x 28) DES to mid to dist CFX.  Marland Kitchen Charcot's joint of knee   . COPD (chronic obstructive pulmonary disease) ([place])   . Deep venous thrombosis (H[facility])    right lower extremity  . Diabetes mellitus    a. A1C 10.7 in 03/2013.  . Diastolic CHF  ([place])    a. EF 40% by cath, 55-60% during 03/2013 adm, required IV diuresis.  . DVT, lower extremity, recurrent ([place])    a. Hx recurrent DVT per record.  . Dyslipidemia   . Elevated CK    a. Pt has refused rheum workup in the past.  . GERD (gastroesophageal reflux disease)   . History of hiatal hernia   . HTN (hypertension)    x 15 years  . Hx of cardiovascular stress test    a. Lexiscan Myoview (03/2010):  diaph atten vs inf scar, no ischemia, EF 47%; Low Risk.  Marland Kitchen Hx of echocardiogram    a. Echo (04/08/13):  Mild LVH, EF 55-60%, restrictive physiology, severe LAE, mild reduced RVSF, mild RAE.  . Leg pain    ABI 6/16:  R 1.2, L 1.1 - normal  . Myocardial infarction ([place])   . Obesity   . Peripheral neuropathy   . Pulmonary embolism ([place])   . Sleep apnea     Past Surgical History:  Procedure Laterality Date  . CORONARY ANGIOPLASTY WITH STENT PLACEMENT    . CORONARY ARTERY BYPASS GRAFT     4 time since 2002  . LEFT HEART CATHETERIZATION WITH CORONARY/GRAFT ANGIOGRAM  04/05/2013  Procedure: LEFT HEART CATHETERIZATION WITH Beatrix Fetters;  Surgeon: Peter M Martinique, MD;  Location: [facility] CATH LAB;  Service: Cardiovascular;;  . left knee surgery    . LOWER EXTREMITY ANGIOGRAPHY Left 12/25/2016   Procedure: LOWER EXTREMITY ANGIOGRAPHY;  Surgeon: Algernon Huxley, MD;  Location: [facility] CV LAB;  Service: Cardiovascular;  Laterality: Left;  . PERCUTANEOUS CORONARY STENT INTERVENTION (PCI-S)  04/05/2013   Procedure: PERCUTANEOUS CORONARY STENT INTERVENTION (PCI-S);  Surgeon: Peter M Martinique, MD;  Location[facility] CATH LAB;  Service: Cardiovascular;;  DES to native Mid cx  . VASCULAR SURGERY      There were no vitals filed for this visit.  Subjective Assessment - 03/20/18 1151    Subjective  Walking in house sometimes with SPC.    Pertinent History  L TTA, DM2, perepheral neuropathy, PVD, CAD, CABG X3, CHF, DVTs, HTN, HLD, A-Fib,     Limitations  Standing;House hold activities;Walking     How long can you sit comfortably?  as long as he wants to sit    How long can you stand comfortably?  unable    How long can you walk comfortably?  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  Yes    Pain Score  5     Pain Location  Back    Pain Orientation  Lower;Mid    Pain Descriptors / Indicators  Aching    Pain Onset  More than a month ago    Pain Frequency  Intermittent                       [facility] Adult PT Treatment/Exercise - 03/20/18 0001      Transfers   Transfers  Sit to Stand;Stand to Sit    Sit to Stand  5: Supervision;With upper extremity assist;With armrests;From chair/3-in-1    Sit to Stand Details  Verbal cues for sequencing;Verbal cues for precautions/safety    Stand to Sit  5: Supervision;With upper extremity assist;With armrests;To chair/3-in-1    Stand to Sit Details (indicate cue type and reason)  Verbal cues for precautions/safety;Verbal cues for sequencing    Stand to Sit Details  cues to completely turn to sit down vs reaching for the chair      Ambulation/Gait   Ambulation/Gait  Yes    Ambulation/Gait Assistance  5: Supervision    Ambulation/Gait Assistance Details  working on posture and visual scanning.    Ambulation Distance (Feet)  120 Feet    Assistive device  Straight cane    Gait Pattern  Step-through pattern;Decreased arm swing - left;Decreased step length - right;Decreased stance time - left;Decreased stride length;Decreased hip/knee flexion - left;Decreased weight shift to left;Left flexed knee in stance;Antalgic;Trunk flexed;Abducted - left    Ambulation Surface  Level;Indoor    Stairs  Yes    Stairs Assistance  5: Supervision    Stairs Assistance Details (indicate cue type and reason)  cues for sequence    Stair Management Technique  One rail Right;One rail Left;Step to pattern;Forwards;With cane    Number of Stairs  4    Height of  Stairs  6      Knee/Hip Exercises: Aerobic   Other Aerobic  Scifit level 2.0, LE only, cues for hip adductor activation to control LE movement,  Attempted to keep RPMs around 65, but pt was fatigued at end of session and  had multiple quick stops, continued to 9 min.                                         Prosthetics   Prosthetic Care Comments   Recommed pt bring in more ply socks to allow PTs to demonstrate correct adjustment.                                      Current prosthetic wear tolerance (days/week)   daily    Current prosthetic wear tolerance (#hours/day)   most of awake hours    Residual limb condition   no rash, no skin issues, continued healing where scab is per pt.    Person(s) Educated  Patient    Education Method  Explanation    Education Method  Verbalized understanding    Donning Prosthesis  Modified independent (device/increased time)    Doffing Prosthesis  Modified independent (device/increased time)          Balance Exercises - 03/20/18 1218      Balance Exercises: Standing   Turning  Both   figure 8 with SPC and then without AD, close supervision, cues for safe technique.         PT Short Term Goals - 03/18/18 1325      PT SHORT TERM GOAL #1   Title  Patient verbalizes proper skin management for residual limb with use of prosthesis. (All updated STGs Target Date: 03/15/2018)    Baseline  MET 03/11/2018    Status  Achieved      PT SHORT TERM GOAL #2   Title  Patient reports back pain increases <4 increments on 0-10 scale with gait & standing activities.     Baseline  03/18/18: met with activity in session today    Status  Achieved      PT SHORT TERM GOAL #3   Title  Patient ambulates 101' with cane & prosthesis scanning environment with supervision.     Baseline  03/18/18: pt needed up to min guard to min assist and was not able to meet the distance due to back pain.     Status  Not Met      PT SHORT TERM GOAL #4   Title  Patient negotiates ramps,  curbs with cane & stairs with 1 rail/cane with prosthesis with minimal assist.     Baseline  03/18/18: met today with cane/prosthesis    Status  Achieved      PT SHORT TERM GOAL #5   Title  Patient able to stand 2 minutes without UE support safely.     Baseline  MET 03/11/2018    Status  Achieved        PT Long Term Goals - 03/11/18 2232      PT LONG TERM GOAL #1   Title  Patient verbalizes proper prosthetic care including problem solving new issues and tolerates wear >90% of awake hours without issues. (All updated LTGs Target Date: 04/12/2018)    Time  2    Period  Months    Status  On-going    Target Date  04/12/18      PT LONG TERM GOAL #2   Title  Patient reports back pain increases </= 2 increments on 0-10 scale with standing & gait activities and verbalizes proper  use of assistive devices to maximize function without significant pain increases.     Time  2    Period  Months    Status  On-going    Target Date  04/12/18      PT LONG TERM GOAL #3   Title  Berg Balance >36/56 to indicate lower fall risk & less dependency in standing ADLs.     Time  3    Period  Months    Status  On-going    Target Date  04/12/18      PT LONG TERM GOAL #4   Title  Patient ambulates 200' including paved surfaces with cane & prosthesis modified indepenent.     Time  2    Period  Months    Status  On-going    Target Date  04/12/18      PT LONG TERM GOAL #5   Title  Patient negotiates ramps, curbs & stairs 1 rail with cane & prosthesis modified independent to enable community access.     Time  2    Period  Months    Status  On-going    Target Date  04/12/18            Plan - 03/20/18 1204    Clinical Impression Statement  Skilled session focused on prosthetic care, gait training with SPC, safety with transfers, and LE strengthening and endurance training on Sci fit stepper.  Pt demonstrated gait at short distances with cane and then progressing without AD, negotiating obstacles at a  supervision level.  Pt continues to have limited activity tolerance reporting back pain and fatigue but seems to be increasing tolerance gradually.                                                                           Rehab Potential  Good    Clinical Impairments Affecting Rehab Potential  weakness BUEs, BLEs & trunk, impaired balance with high fall risk, dependent gait with prosthesis, dependent in proper prosthetic care & use    PT Frequency  2x / week    PT Duration  8 weeks    PT Treatment/Interventions  ADLs/Self Care Home Management;Canalith Repostioning;DME Instruction;Gait training;Stair training;Functional mobility training;Therapeutic activities;Therapeutic exercise;Balance training;Patient/family education;Neuromuscular re-education;Prosthetic Training;Manual techniques;Vestibular    PT Next Visit Plan  prosthetic gait household no device, limited community cane & longer distances with rollator    PT Home Exercise Plan  Access Code: [email or web address]     Consulted and Agree with Plan of Care  Patient       Patient will benefit from skilled therapeutic intervention in order to improve the following deficits and impairments:  Abnormal gait, Decreased activity tolerance, Decreased balance, Decreased coordination, Decreased endurance, Decreased knowledge of use of DME, Decreased mobility, Decreased range of motion, Decreased strength, Dizziness, Impaired flexibility, Prosthetic Dependency, Postural dysfunction, Pain, Decreased skin integrity  Visit Diagnosis: Muscle weakness (generalized)  Other abnormalities of gait and mobility  Unsteadiness on feet     Problem List Patient Active Problem List   Diagnosis Date Noted  . SOB (shortness of breath) 12/06/2017  . Uncontrolled type 2 diabetes mellitus with insulin therapy ([place]) 12/06/2017  . Primary osteoarthritis of left knee 11/02/2017  .  Open wound 06/05/2017  . Diabetic ulcer of left lower leg ([place]) 05/16/2017  . Acquired absence of  left leg below knee ([place]) 02/28/2017  . Impaired mobility and activities of daily living 02/15/2017  . Atherosclerotic heart disease of native coronary artery without angina pectoris 01/05/2017  . HTN (hypertension) 12/19/2016  . Atherosclerosis of native arteries of the extremities with ulceration ([place]) 12/19/2016  . Peripheral vascular disease ([place]) 12/07/2016  . CHF (congestive heart failure) ([place]) 11/24/2016  . Demand ischemia ([place])   . 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 Stage 3 chronic kidney disease due to diabetes mellitus ([place]) 11/23/2016  . NSTEMI (non-ST elevated myocardial infarction) ([place]) 11/23/2016  . Acute diastolic CHF (congestive heart failure) ([place]) 11/23/2016  . Acute congestive heart failure ([place])   . Burn of left foot 11/21/2016  . Diabetic peripheral neuropathy ([place]) 11/21/2016  . Diabetic ulcer of right foot associated with type 2 diabetes mellitus, with fat layer exposed ([place]) 11/21/2016  . DDD (degenerative disc disease), lumbar 03/27/2016  . History of deep vein thrombosis (DVT) of lower extremity 03/26/2016  . History of pulmonary embolism 10/01/2015  . Nephropathy, diabetic ([place]) 10/01/2015  . 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 Chronic obstructive pulmonary disease, unspecified ([place]) 12/16/2013  . Medication management 04/14/2013  . History of recurrent DVT/E 04/11/2013  . Sleep apnea 04/11/2013  . Bradycardia 04/11/2013  . History of Elevated CK level  04/11/2013  . Class 1 obesity due to excess calories with serious comorbidity and body mass index (BMI) of 34.0 to 34.9 in adult 04/11/2013  . Peripheral neuropathy ([place])   . Charcot's arthropathy   . Chronic diastolic heart failure ([place]) 04/08/2013  . Paroxysmal atrial fibrillation ([place]) 04/07/2013  . Reactive depression (situational) 05/16/2011  . Long term (current) use of anticoagulants 09/21/2010  . 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 ED (erectile dysfunction)  of organic origin 07/28/2010  . Poorly controlled type 2 diabetes mellitus with peripheral neuropathy ([place]) 05/26/2008  . Hyperlipidemia 05/26/2008  . Hypertensive heart disease 05/26/2008    Bjorn Loser, PTA  03/20/18, 12:49 PM [facility] [address] [place], [place], [address] Phone: [phone]   Fax:  [phone]  Name: JOSEFF LUCKMAN MRN: [id number] Date of Birth: 1955-04-28

## 2018-03-20 NOTE — Telephone Encounter (Signed)
Patient not seen since October.  Refilled enough med to last until Dr. Percival Spanish visit on Feb 19

## 2018-03-20 NOTE — Telephone Encounter (Signed)
New Message           *STAT* If patient is at the pharmacy, call can be transferred to refill team.   1. Which medications need to be refilled? (please list name of each medication and dose if known) [place]   2. Which pharmacy/location (including street and city if local pharmacy) is medication to be sent to?[facility]  3. Do they need a 30 day or 90 day supply? [place]

## 2018-03-20 NOTE — Patient Instructions (Signed)
Please take 3 tablets tonight & tomorrow, 2 tablets on Friday, then resume dosage of 2 tablets daily except 1 TABLET ON MONDAYS & FRIDAYS. Recheck in 2 weeks.

## 2018-03-23 ENCOUNTER — Other Ambulatory Visit: Payer: Self-pay | Admitting: Physician Assistant

## 2018-03-23 DIAGNOSIS — I5033 Acute on chronic diastolic (congestive) heart failure: Secondary | ICD-10-CM

## 2018-03-25 ENCOUNTER — Encounter: Payer: Self-pay | Admitting: Physical Therapy

## 2018-03-25 ENCOUNTER — Ambulatory Visit: Payer: Managed Care, Other (non HMO) | Admitting: Physical Therapy

## 2018-03-25 DIAGNOSIS — M6281 Muscle weakness (generalized): Secondary | ICD-10-CM

## 2018-03-25 DIAGNOSIS — R2681 Unsteadiness on feet: Secondary | ICD-10-CM

## 2018-03-25 DIAGNOSIS — R42 Dizziness and giddiness: Secondary | ICD-10-CM

## 2018-03-25 DIAGNOSIS — R2689 Other abnormalities of gait and mobility: Secondary | ICD-10-CM

## 2018-03-25 DIAGNOSIS — R293 Abnormal posture: Secondary | ICD-10-CM

## 2018-03-26 NOTE — Therapy (Signed)
[facility] [address] [place], [place], [address] Phone: [phone]   Fax:  [phone]  Physical Therapy Treatment  Patient Details  Name: Bobby Lindsey MRN: [id number] Date of Birth: 01/03/1956 Referring Provider (PT): Rhina Brackett, MD   Encounter Date: 03/25/2018  PT End of Session - 03/25/18 1412    Visit Number  22    Number of Visits  30    Authorization Type  [employer]. 30 visit per year.    Authorization - Visit Number  7    Authorization - Number of Visits  20    PT Start Time  1320    PT Stop Time  1405    PT Time Calculation (min)  45 min    Equipment Utilized During Treatment  Gait belt    Activity Tolerance  Patient tolerated treatment well;Patient limited by pain;Patient limited by fatigue    Behavior During Therapy  [facility] for tasks assessed/performed       Past Medical History:  Diagnosis Date  . Anginal pain ([place])    last pm  . Atrial fibrillation ([place])    a. Transient during 03/2013 admission.  . Bradycardia    a. Bradycardia/pauses/possible Mobitz II during 03/2013 adm. Not on BB due to this.  Marland Kitchen CAD (coronary artery disease)    a. s/p CABG 2002. b. Hx Cypher stent to the RCA. c. Inf-lat STEMI 03/2013:  LHC (04/05/13):  mLAD occluded, pD1 90, apical br of Dx occluded, CFX occluded, pOM1 90-95, RCA stents patent, diff RCA 30, S-Dx occluded, S-PDA occluded, S-OM1 40-50, L-LAD patent, EF 40% with inf HK.  PCI:  Promus (2.5 x 28) DES to mid to dist CFX.  Marland Kitchen Charcot's joint of knee   . COPD (chronic obstructive pulmonary disease) ([place])   . Deep venous thrombosis (HCC)    right lower extremity  . Diabetes mellitus    a. A1C 10.7 in 03/2013.  . Diastolic CHF ([place])    a. EF 40% by cath, 55-60% during 03/2013 adm, required IV diuresis.  . DVT, lower extremity, recurrent ([place])    a. Hx recurrent DVT per record.  . Dyslipidemia   . Elevated CK    a. Pt has refused rheum workup in the past.  . GERD  (gastroesophageal reflux disease)   . History of hiatal hernia   . HTN (hypertension)    x 15 years  . Hx of cardiovascular stress test    a. Lexiscan Myoview (03/2010):  diaph atten vs inf scar, no ischemia, EF 47%; Low Risk.  Marland Kitchen Hx of echocardiogram    a. Echo (04/08/13):  Mild LVH, EF 55-60%, restrictive physiology, severe LAE, mild reduced RVSF, mild RAE.  . Leg pain    ABI 6/16:  R 1.2, L 1.1 - normal  . Myocardial infarction ([place])   . Obesity   . Peripheral neuropathy   . Pulmonary embolism ([place])   . Sleep apnea     Past Surgical History:  Procedure Laterality Date  . CORONARY ANGIOPLASTY WITH STENT PLACEMENT    . CORONARY ARTERY BYPASS GRAFT     4 time since 2002  . LEFT HEART CATHETERIZATION WITH CORONARY/GRAFT ANGIOGRAM  04/05/2013   Procedure: LEFT HEART CATHETERIZATION WITH Beatrix Fetters;  Surgeon: Peter M Martinique, MD;  Location: [facility] CATH LAB;  Service: Cardiovascular;;  . left knee surgery    . LOWER EXTREMITY ANGIOGRAPHY Left 12/25/2016   Procedure: LOWER EXTREMITY ANGIOGRAPHY;  Surgeon: Algernon Huxley, MD;  Location: [facility] CV LAB;  Service: Cardiovascular;  Laterality: Left;  . PERCUTANEOUS CORONARY STENT INTERVENTION (PCI-S)  04/05/2013   Procedure: PERCUTANEOUS CORONARY STENT INTERVENTION (PCI-S);  Surgeon: Peter M Martinique, MD;  Location: [facility] CATH LAB;  Service: Cardiovascular;;  DES to native Mid cx  . VASCULAR SURGERY      There were no vitals filed for this visit.  Subjective Assessment - 03/25/18 1315    Subjective  He went hunting again. He sat on rollator near truck.     Pertinent History  L TTA, DM2, perepheral neuropathy, PVD, CAD, CABG X3, CHF, DVTs, HTN, HLD, A-Fib,     Limitations  Standing;House hold activities;Walking    How long can you sit comfortably?  as long as he wants to sit    How long can you stand comfortably?  unable    How long can you walk comfortably?  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  Yes    Pain Score  5    sitting 2/10   Pain Location  Back    Pain Orientation  Lower;Mid    Pain Descriptors / Indicators  Aching    Pain Type  Chronic pain    Pain Onset  More than a month ago    Pain Frequency  Intermittent    Aggravating Factors   increased walking / activity    Pain Relieving Factors  sitting down, resting                       [facility] Adult PT Treatment/Exercise - 03/25/18 1315      Transfers   Transfers  Sit to Stand;Stand to Sit    Sit to Stand  5: Supervision;With upper extremity assist;With armrests;From chair/3-in-1    Sit to Stand Details  Verbal cues for sequencing;Verbal cues for precautions/safety    Stand to Sit  5: Supervision;With upper extremity assist;With armrests;To chair/3-in-1    Stand to Sit Details (indicate cue type and reason)  Verbal cues for precautions/safety;Verbal cues for sequencing      Ambulation/Gait   Ambulation/Gait  Yes    Ambulation/Gait Assistance  5: Supervision    Ambulation Distance (Feet)  125 Feet   125' with cane & 30' no device,    Assistive device  Straight cane;None;Prosthesis    Gait Pattern  Step-through pattern;Decreased arm swing - left;Decreased step length - right;Decreased stance time - left;Decreased stride length;Decreased hip/knee flexion - left;Decreased weight shift to left;Left flexed knee in stance;Antalgic;Trunk flexed;Abducted - left    Ambulation Surface  Indoor;Level    Stairs  Yes    Stairs Assistance  5: Supervision    Stairs Assistance Details (indicate cue type and reason)  verbal cues on technique with TTA prosthesis    Stair Management Technique  One rail Right;One rail Left;Step to pattern;Forwards;With cane;Alternating pattern    Number of Stairs  4   3 reps   Height of Stairs  6    Ramp  5: Supervision   cane & prosthesis   Ramp Details (indicate cue type and reason)  verbal cues on technique with TTA  prosthesis    Curb  5: Supervision   cane & prosthesis   Curb Details (indicate cue type and reason)  verbal cues on technique with TTA prosthesis      Prosthetics   Prosthetic Care Comments  PT discussed adjusting ply socks. When too few, prosthesis will appear too short & too many, will appear too tall.  Either can effect balance    Current prosthetic wear tolerance (days/week)   daily    Current prosthetic wear tolerance (#hours/day)   most of awake hours    Residual limb condition   no rash, no skin issues, continued healing where scab is per pt.    Education Provided  Correct ply sock adjustment;Proper Donning    Person(s) Educated  Patient    Education Method  Explanation;Demonstration;Verbal cues;Tactile cues    Education Method  Verbalized understanding;Needs further instruction       Swiss or Therapy ball that is 75cm slightly underinflated. Place ball in front of couch or recliner with 2 kitchen chairs on each side.  Start each exercise holding onto chairs at sides and then progress to not holding onto chairs.    Exercises   Pelvic Tilt on Swiss Ball - 10 reps   Seated Lateral Pelvic Tilt on Swiss Ball - 10 reps -   Pelvic Circles on Swiss [place] - 10 reps -   Swiss Ball March - 10 reps  Swiss Diona Foley forward step 10 reps  Swiss ball side step seated 10 reps UE reciprocal motions: shoulder flexion (right limited by orthopedic issue) 10 reps And lateral reach to across midline reach 10 reps         PT Short Term Goals - 03/18/18 1325      PT SHORT TERM GOAL #1   Title  Patient verbalizes proper skin management for residual limb with use of prosthesis. (All updated STGs Target Date: 03/15/2018)    Baseline  MET 03/11/2018    Status  Achieved      PT SHORT TERM GOAL #2   Title  Patient reports back pain increases <4 increments on 0-10 scale with gait & standing activities.     Baseline  03/18/18: met with activity in session today    Status  Achieved      PT  SHORT TERM GOAL #3   Title  Patient ambulates 68' with cane & prosthesis scanning environment with supervision.     Baseline  03/18/18: pt needed up to min guard to min assist and was not able to meet the distance due to back pain.     Status  Not Met      PT SHORT TERM GOAL #4   Title  Patient negotiates ramps, curbs with cane & stairs with 1 rail/cane with prosthesis with minimal assist.     Baseline  03/18/18: met today with cane/prosthesis    Status  Achieved      PT SHORT TERM GOAL #5   Title  Patient able to stand 2 minutes without UE support safely.     Baseline  MET 03/11/2018    Status  Achieved        PT Long Term Goals - 03/11/18 2232      PT LONG TERM GOAL #1   Title  Patient verbalizes proper prosthetic care including problem solving new issues and tolerates wear >90% of awake hours without issues. (All updated LTGs Target Date: 04/12/2018)    Time  2    Period  Months    Status  On-going    Target Date  04/12/18      PT LONG TERM GOAL #2   Title  Patient reports back pain increases </= 2 increments on 0-10 scale with standing & gait activities and  verbalizes proper use of assistive devices to maximize function without significant pain increases.     Time  2    Period  Months    Status  On-going    Target Date  04/12/18      PT LONG TERM GOAL #3   Title  Berg Balance >36/56 to indicate lower fall risk & less dependency in standing ADLs.     Time  3    Period  Months    Status  On-going    Target Date  04/12/18      PT LONG TERM GOAL #4   Title  Patient ambulates 200' including paved surfaces with cane & prosthesis modified indepenent.     Time  2    Period  Months    Status  On-going    Target Date  04/12/18      PT LONG TERM GOAL #5   Title  Patient negotiates ramps, curbs & stairs 1 rail with cane & prosthesis modified independent to enable community access.     Time  2    Period  Months    Status  On-going    Target Date  04/12/18             Plan - 03/25/18 1413    Clinical Impression Statement  Today's skilled session focused on balance reactions in standing and therapy ball exercises to improve low back mobility and balance. PT Reviewed using different assistive devices to maximize activity tolerance     Rehab Potential  Good    Clinical Impairments Affecting Rehab Potential  weakness BUEs, BLEs & trunk, impaired balance with high fall risk, dependent gait with prosthesis, dependent in proper prosthetic care & use    PT Frequency  2x / week    PT Duration  8 weeks    PT Treatment/Interventions  ADLs/Self Care Home Management;Canalith Repostioning;DME Instruction;Gait training;Stair training;Functional mobility training;Therapeutic activities;Therapeutic exercise;Balance training;Patient/family education;Neuromuscular re-education;Prosthetic Training;Manual techniques;Vestibular    PT Next Visit Plan  prosthetic gait household no device, limited community cane & longer distances with rollator    PT Home Exercise Plan  Access Code: [email or web address]     Consulted and Agree with Plan of Care  Patient       Patient will benefit from skilled therapeutic intervention in order to improve the following deficits and impairments:  Abnormal gait, Decreased activity tolerance, Decreased balance, Decreased coordination, Decreased endurance, Decreased knowledge of use of DME, Decreased mobility, Decreased range of motion, Decreased strength, Dizziness, Impaired flexibility, Prosthetic Dependency, Postural dysfunction, Pain, Decreased skin integrity  Visit Diagnosis: Muscle weakness (generalized)  Other abnormalities of gait and mobility  Unsteadiness on feet  Dizziness and giddiness  Abnormal posture     Problem List Patient Active Problem List   Diagnosis Date Noted  . SOB (shortness of breath) 12/06/2017  . Uncontrolled type 2 diabetes mellitus with insulin therapy ([place]) 12/06/2017  . Primary osteoarthritis of left knee  11/02/2017  . Open wound 06/05/2017  . Diabetic ulcer of left lower leg ([place]) 05/16/2017  . Acquired absence of left leg below knee ([place]) 02/28/2017  . Impaired mobility and activities of daily living 02/15/2017  . Atherosclerotic heart disease of native coronary artery without angina pectoris 01/05/2017  . HTN (hypertension) 12/19/2016  . Atherosclerosis of native arteries of the extremities with ulceration ([place]) 12/19/2016  . Peripheral vascular disease ([place]) 12/07/2016  . CHF (congestive heart failure) ([place]) 11/24/2016  . Demand ischemia ([place])   . Old inferior wall myocardial  infarction 11/23/2016  . Stage 3 chronic kidney disease due to diabetes mellitus ([place]) 11/23/2016  . NSTEMI (non-ST elevated myocardial infarction) ([place]) 11/23/2016  . Acute diastolic CHF (congestive heart failure) ([place]) 11/23/2016  . Acute congestive heart failure ([place])   . Burn of left foot 11/21/2016  . Diabetic peripheral neuropathy ([place]) 11/21/2016  . Diabetic ulcer of right foot associated with type 2 diabetes mellitus, with fat layer exposed ([place]) 11/21/2016  . DDD (degenerative disc disease), lumbar 03/27/2016  . History of deep vein thrombosis (DVT) of lower extremity 03/26/2016  . History of pulmonary embolism 10/01/2015  . Nephropathy, diabetic ([place]) 10/01/2015  . 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 Chronic obstructive pulmonary disease, unspecified ([place]) 12/16/2013  . Medication management 04/14/2013  . History of recurrent DVT/E 04/11/2013  . Sleep apnea 04/11/2013  . Bradycardia 04/11/2013  . History of Elevated CK level  04/11/2013  . Class 1 obesity due to excess calories with serious comorbidity and body mass index (BMI) of 34.0 to 34.9 in adult 04/11/2013  . Peripheral neuropathy ([place])   . Charcot's arthropathy   . Chronic diastolic heart failure ([place]) 04/08/2013  . Paroxysmal atrial fibrillation ([place]) 04/07/2013  . Reactive depression (situational) 05/16/2011   . Long term (current) use of anticoagulants 09/21/2010  . 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 ED (erectile dysfunction) of organic origin 07/28/2010  . Poorly controlled type 2 diabetes mellitus with peripheral neuropathy ([place]) 05/26/2008  . Hyperlipidemia 05/26/2008  . Hypertensive heart disease 05/26/2008    Jamey Reas PT, DPT 03/26/2018, 12:33 PM  [facility] [address] [place], [place], [address] Phone: [phone]   Fax:  [phone]  Name: Bobby Lindsey MRN: [id number] Date of Birth: [birth year]

## 2018-03-27 ENCOUNTER — Ambulatory Visit: Payer: Managed Care, Other (non HMO) | Admitting: Physical Therapy

## 2018-03-27 ENCOUNTER — Encounter: Payer: Self-pay | Admitting: Physical Therapy

## 2018-03-27 DIAGNOSIS — R2681 Unsteadiness on feet: Secondary | ICD-10-CM

## 2018-03-27 DIAGNOSIS — M6281 Muscle weakness (generalized): Secondary | ICD-10-CM | POA: Diagnosis not present

## 2018-03-27 DIAGNOSIS — R293 Abnormal posture: Secondary | ICD-10-CM

## 2018-03-27 DIAGNOSIS — R2689 Other abnormalities of gait and mobility: Secondary | ICD-10-CM

## 2018-03-28 NOTE — Therapy (Signed)
[facility] [address] [place], [place], [address] Phone: [phone]   Fax:  [phone]  Physical Therapy Treatment  Patient Details  Name: Bobby Lindsey MRN: [id number] Date of Birth: [birth year] Referring Provider (PT): Rhina Brackett, MD   Encounter Date: 03/27/2018  PT End of Session - 03/27/18 1324    Visit Number  23    Number of Visits  30    Authorization Type  [employer]. 30 visit per year.    Authorization - Visit Number  8    Authorization - Number of Visits  20    PT Start Time  7262   pt late for appt   PT Stop Time  1402    PT Time Calculation (min)  41 min    Equipment Utilized During Treatment  Gait belt    Activity Tolerance  Patient tolerated treatment well;Patient limited by pain;Patient limited by fatigue    Behavior During Therapy  [facility] for tasks assessed/performed       Past Medical History:  Diagnosis Date  . Anginal pain ([place])    last pm  . Atrial fibrillation ([place])    a. Transient during 03/2013 admission.  . Bradycardia    a. Bradycardia/pauses/possible Mobitz II during 03/2013 adm. Not on BB due to this.  Marland Kitchen CAD (coronary artery disease)    a. s/p CABG 2002. b. Hx Cypher stent to the RCA. c. Inf-lat STEMI 03/2013:  LHC (04/05/13):  mLAD occluded, pD1 90, apical br of Dx occluded, CFX occluded, pOM1 90-95, RCA stents patent, diff RCA 30, S-Dx occluded, S-PDA occluded, S-OM1 40-50, L-LAD patent, EF 40% with inf HK.  PCI:  Promus (2.5 x 28) DES to mid to dist CFX.  Marland Kitchen Charcot's joint of knee   . COPD (chronic obstructive pulmonary disease) ([place])   . Deep venous thrombosis (HCC)    right lower extremity  . Diabetes mellitus    a. A1C 10.7 in 03/2013.  . Diastolic CHF ([place])    a. EF 40% by cath, 55-60% during 03/2013 adm, required IV diuresis.  . DVT, lower extremity, recurrent ([place])    a. Hx recurrent DVT per record.  . Dyslipidemia   . Elevated CK    a. Pt has refused rheum workup in  the past.  . GERD (gastroesophageal reflux disease)   . History of hiatal hernia   . HTN (hypertension)    x 15 years  . Hx of cardiovascular stress test    a. Lexiscan Myoview (03/2010):  diaph atten vs inf scar, no ischemia, EF 47%; Low Risk.  Marland Kitchen Hx of echocardiogram    a. Echo (04/08/13):  Mild LVH, EF 55-60%, restrictive physiology, severe LAE, mild reduced RVSF, mild RAE.  . Leg pain    ABI 6/16:  R 1.2, L 1.1 - normal  . Myocardial infarction ([place])   . Obesity   . Peripheral neuropathy   . Pulmonary embolism ([place])   . Sleep apnea     Past Surgical History:  Procedure Laterality Date  . CORONARY ANGIOPLASTY WITH STENT PLACEMENT    . CORONARY ARTERY BYPASS GRAFT     4 time since 2002  . LEFT HEART CATHETERIZATION WITH CORONARY/GRAFT ANGIOGRAM  04/05/2013   Procedure: LEFT HEART CATHETERIZATION WITH Beatrix Fetters;  Surgeon: Peter M Martinique, MD;  Location: [facility] CATH LAB;  Service: Cardiovascular;;  . left knee surgery    . LOWER EXTREMITY ANGIOGRAPHY Left 12/25/2016   Procedure: LOWER EXTREMITY ANGIOGRAPHY;  Surgeon: Algernon Huxley, MD;  Location: [facility] CV LAB;  Service: Cardiovascular;  Laterality: Left;  . PERCUTANEOUS CORONARY STENT INTERVENTION (PCI-S)  04/05/2013   Procedure: PERCUTANEOUS CORONARY STENT INTERVENTION (PCI-S);  Surgeon: Peter M Martinique, MD;  Location: [facility] CATH LAB;  Service: Cardiovascular;;  DES to native Mid cx  . VASCULAR SURGERY      There were no vitals filed for this visit.  Subjective Assessment - 03/27/18 1323    Subjective  Used cane from lobby to gym with increased back pain, left rollator in lobby due to mud on tires    Pertinent History  L TTA, DM2, perepheral neuropathy, PVD, CAD, CABG X3, CHF, DVTs, HTN, HLD, A-Fib,     Limitations  Standing;House hold activities;Walking    How long can you sit comfortably?  as long as he wants to sit    How long can you stand comfortably?  unable    How long can you walk comfortably?  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  Yes    Pain Score  8     Pain Location  Back    Pain Orientation  Lower;Mid    Pain Descriptors / Indicators  Aching    Pain Type  Chronic pain    Pain Onset  More than a month ago    Pain Frequency  Constant    Aggravating Factors   increased walking/activity    Pain Relieving Factors  sitting down, resting                       [facility] Adult PT Treatment/Exercise - 03/27/18 1326      Transfers   Transfers  Sit to Stand;Stand to Sit    Sit to Stand  5: Supervision;With upper extremity assist;With armrests;From chair/3-in-1    Stand to Sit  5: Supervision;With upper extremity assist;With armrests;To chair/3-in-1      Ambulation/Gait   Ambulation/Gait  Yes    Ambulation/Gait Assistance  5: Supervision;4: Min guard    Ambulation details Cues needed on posture, cane placement and weight shifting. Distance limited by onset of back pain each time.    Ambulation Distance (Feet)  115 Feet   x 3 w/cane, around gym with activities   Assistive device  Straight cane;None;Prosthesis    Gait Pattern  Step-through pattern;Decreased arm swing - left;Decreased step length - right;Decreased stance time - left;Decreased stride length;Decreased hip/knee flexion - left;Decreased weight shift to left;Left flexed knee in stance;Antalgic;Trunk flexed;Abducted - left    Ambulation Surface  Level;Indoor    Stairs  Yes    Stairs Assistance  5: Supervision    Stairs Assistance Details (indicate cue type and reason)  reminder cues for weight shifting and to advance hand along rails.     Stair Management Technique  One rail Right;One rail Left;Step to pattern;Forwards;With cane    Number of Stairs  4   x2 reps   Height of Stairs  6      High Level Balance   High Level Balance Activities  Side stepping;Marching forwards;Marching backwards;Tandem walking   tandem  fwd/bwd   High Level Balance Comments  in parallel bars with single to no UE support: 3 laps each with cues on posture and ex form/technique     Exercises   Exercises  Other Exercises    Other Exercises  seated on tall stool in parallel bars: fwd flex to upright posture, then bwd partial curl ups, lateral reaching alternating sides x 10 reps each; with 3# weighted dowel rod- UE flexion/raises, then UE rotation left<>right x 10 reps each way.        Prosthetics   Current prosthetic wear tolerance (days/week)   daily    Current prosthetic wear tolerance (#hours/day)   most of awake hours    Residual limb condition   intact per pyt report    Donning Prosthesis  Modified independent (device/increased time)    Doffing Prosthesis  Modified independent (device/increased time)          PT Short Term Goals - 03/18/18 1325      PT SHORT TERM GOAL #1   Title  Patient verbalizes proper skin management for residual limb with use of prosthesis. (All updated STGs Target Date: 03/15/2018)    Baseline  MET 03/11/2018    Status  Achieved      PT SHORT TERM GOAL #2   Title  Patient reports back pain increases <4 increments on 0-10 scale with gait & standing activities.     Baseline  03/18/18: met with activity in session today    Status  Achieved      PT SHORT TERM GOAL #3   Title  Patient ambulates 21' with cane & prosthesis scanning environment with supervision.     Baseline  03/18/18: pt needed up to min guard to min assist and was not able to meet the distance due to back pain.     Status  Not Met      PT SHORT TERM GOAL #4   Title  Patient negotiates ramps, curbs with cane & stairs with 1 rail/cane with prosthesis with minimal assist.     Baseline  03/18/18: met today with cane/prosthesis    Status  Achieved      PT SHORT TERM GOAL #5   Title  Patient able to stand 2 minutes without UE support safely.     Baseline  MET 03/11/2018    Status  Achieved        PT Long Term Goals - 03/11/18  2232      PT LONG TERM GOAL #1   Title  Patient verbalizes proper prosthetic care including problem solving new issues and tolerates wear >90% of awake hours without issues. (All updated LTGs Target Date: 04/12/2018)    Time  2    Period  Months    Status  On-going    Target Date  04/12/18      PT LONG TERM GOAL #2   Title  Patient reports back pain increases </= 2 increments on 0-10 scale with standing & gait activities and verbalizes proper use of assistive devices to maximize function without significant pain increases.     Time  2    Period  Months    Status  On-going    Target Date  04/12/18      PT LONG TERM GOAL #3   Title  Berg Balance >36/56 to indicate lower fall risk & less dependency in standing ADLs.     Time  3    Period  Months    Status  On-going    Target Date  04/12/18      PT LONG TERM GOAL #4   Title  Patient ambulates 200' including paved surfaces with cane & prosthesis modified indepenent.     Time  2    Period  Months    Status  On-going    Target Date  04/12/18      PT LONG TERM GOAL #5   Title  Patient negotiates ramps, curbs & stairs 1 rail with cane & prosthesis modified independent to enable community access.     Time  2    Period  Months    Status  On-going    Target Date  04/12/18         03/27/18 1325  Plan  Clinical Impression Statement Today's skilled session focused on gait with cane, balance reactions and strengthening. Pt continues to be limited in standing by back pain. Improves with seated rest breaks. The pt is progressing toward goals and should benefit from continued PT to progress toward unmet goals.   Pt will benefit from skilled therapeutic intervention in order to improve on the following deficits Abnormal gait;Decreased activity tolerance;Decreased balance;Decreased coordination;Decreased endurance;Decreased knowledge of use of DME;Decreased mobility;Decreased range of motion;Decreased strength;Dizziness;Impaired  flexibility;Prosthetic Dependency;Postural dysfunction;Pain;Decreased skin integrity  Rehab Potential Good  Clinical Impairments Affecting Rehab Potential weakness BUEs, BLEs & trunk, impaired balance with high fall risk, dependent gait with prosthesis, dependent in proper prosthetic care & use  PT Frequency 2x / week  PT Duration 8 weeks  PT Treatment/Interventions ADLs/Self Care Home Management;[place] Repostioning;DME Instruction;Gait training;Stair training;Functional mobility training;Therapeutic activities;Therapeutic exercise;Balance training;Patient/family education;Neuromuscular re-education;Prosthetic Training;Manual techniques;Vestibular  PT Next Visit Plan prosthetic gait household no device, limited community cane & longer distances with rollator; continue to address core strengthening/posture as able to work toward decr'd back pain with activity   PT Home Exercise Plan Access Code: [email or web address]   Consulted and Agree with Plan of Care Patient          Patient will benefit from skilled therapeutic intervention in order to improve the following deficits and impairments:  Abnormal gait, Decreased activity tolerance, Decreased balance, Decreased coordination, Decreased endurance, Decreased knowledge of use of DME, Decreased mobility, Decreased range of motion, Decreased strength, Dizziness, Impaired flexibility, Prosthetic Dependency, Postural dysfunction, Pain, Decreased skin integrity  Visit Diagnosis: Muscle weakness (generalized)  Other abnormalities of gait and mobility  Unsteadiness on feet  Abnormal posture     Problem List Patient Active Problem List   Diagnosis Date Noted  . SOB (shortness of breath) 12/06/2017  . Uncontrolled type 2 diabetes mellitus with insulin therapy ([place]) 12/06/2017  . Primary osteoarthritis of left knee 11/02/2017  . Open wound 06/05/2017  . Diabetic ulcer of left lower leg ([place]) 05/16/2017  . Acquired absence of left leg below knee ([place])  02/28/2017  . Impaired mobility and activities of daily living 02/15/2017  . Atherosclerotic heart disease of native coronary artery without angina pectoris 01/05/2017  . HTN (hypertension) 12/19/2016  . Atherosclerosis of native arteries of the extremities with ulceration ([place]) 12/19/2016  . Peripheral vascular disease ([place]) 12/07/2016  . CHF (congestive heart failure) ([place]) 11/24/2016  . Demand ischemia ([place])   . 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 Stage 3 chronic kidney disease due to diabetes mellitus ([place]) 11/23/2016  . NSTEMI (non-ST elevated myocardial infarction) ([place]) 11/23/2016  . Acute diastolic CHF (congestive heart failure) ([place]) 11/23/2016  . Acute congestive heart failure ([place])   . Burn of left foot 11/21/2016  . Diabetic peripheral neuropathy ([place]) 11/21/2016  . Diabetic ulcer of right foot associated with type 2 diabetes mellitus, with fat layer exposed ([place]) 11/21/2016  . DDD (degenerative disc disease), lumbar 03/27/2016  . History of deep vein thrombosis (DVT) of lower extremity  03/26/2016  . History of pulmonary embolism 10/01/2015  . Nephropathy, diabetic ([place]) 10/01/2015  . 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 Chronic obstructive pulmonary disease, unspecified ([place]) 12/16/2013  . Medication management 04/14/2013  . History of recurrent DVT/E 04/11/2013  . Sleep apnea 04/11/2013  . Bradycardia 04/11/2013  . History of Elevated CK level  04/11/2013  . Class 1 obesity due to excess calories with serious comorbidity and body mass index (BMI) of 34.0 to 34.9 in adult 04/11/2013  . Peripheral neuropathy ([place])   . Charcot's arthropathy   . Chronic diastolic heart failure ([place]) 04/08/2013  . Paroxysmal atrial fibrillation ([place]) 04/07/2013  . Reactive depression (situational) 05/16/2011  . Long term (current) use of anticoagulants 09/21/2010  . 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 ED (erectile dysfunction) of organic origin  07/28/2010  . Poorly controlled type 2 diabetes mellitus with peripheral neuropathy ([place]) 05/26/2008  . Hyperlipidemia 05/26/2008  . Hypertensive heart disease 05/26/2008    Willow Ora, PTA, [facility] [address] [place], [place] [address] [phone] 03/28/18, 2:40 PM   Name: DESMEN SCHOFFSTALL MRN: [id number] Date of Birth: 1956/02/10

## 2018-03-31 NOTE — Progress Notes (Addendum)
Cardiology Office Note   Date:  04/03/2018   ID:  Bobby Lindsey, DOB 1955/06/03, MRN [id number]  PCP:  Adin Hector, MD  Cardiologist:   Minus Breeding, MD    Chief Complaint  Patient presents with  . Coronary Artery Disease      History of Present Illness: Bobby Lindsey is a 63 y.o. male who presents for follow up of dyspnea.  He has a history of CAD s/p CABG 2002 and DES to RCA, recurrent PE/DVT, HTN, HLD, DM and OSA.  He was admitted in February 2015 with inferior STEMI and underwent emergent cardiac catheterization. This demonstrated patent SVG to OM1 and LIMA to LAD, occluded SVG to diagonal and SVG to PDA. EF 40% with inferior wall abnormality. Echo showed normal LV EF prior to discharge. The culprit was felt to be an occluded native left circumflex which was treated with drug-eluting stent. the hospitalization was complicated by transient atrial fibrillation and acute on chronic diastolic heart failure.  He has a history of elevated CK and remain off of statin. After he was started on amiodarone for A. fib, he developed Mobitz II AV block, beta blocker was discontinued. He later self discontinued amiodarone thinking it was causing dyspnea. Plavix was discontinued in September 2016.  He presented in October with cellulitis and volume overload.  He had PCI of his left mid to distal anterior tibial artery in Nov 2018.  He has not been seen since 2018.  Since I last saw him he did have a left BKA at [facility].  I reviewed these records and do not see any cardiac work-up but I also do not see any cardiac complications with this.  He gets around with a cane.  He does physical therapy.  He is not been having any chest pressure, neck or arm discomfort.  He occasionally will have some right lower extremity swelling and will take an extra Lasix but for the most part this has not been an issue.  He says his breathing is good.  Is not been having any PND or orthopnea.  He is not having  any palpitations, presyncope or syncope.  Unfortunately his sugar is not well controlled.  It may be because he eats cherry pie for breakfast.  He actively is working with [facility] on this.   Past Medical History:  Diagnosis Date  . Anginal pain ([place])    last pm  . Atrial fibrillation ([place])    a. Transient during 03/2013 admission.  . Bradycardia    a. Bradycardia/pauses/possible Mobitz II during 03/2013 adm. Not on BB due to this.  Marland Kitchen CAD (coronary artery disease)    a. s/p CABG 2002. b. Hx Cypher stent to the RCA. c. Inf-lat STEMI 03/2013:  LHC (04/05/13):  mLAD occluded, pD1 90, apical br of Dx occluded, CFX occluded, pOM1 90-95, RCA stents patent, diff RCA 30, S-Dx occluded, S-PDA occluded, S-OM1 40-50, L-LAD patent, EF 40% with inf HK.  PCI:  Promus (2.5 x 28) DES to mid to dist CFX.  Marland Kitchen Charcot's joint of knee   . COPD (chronic obstructive pulmonary disease) ([place])   . Deep venous thrombosis (HCC)    right lower extremity  . Diabetes mellitus    a. A1C 10.7 in 03/2013.  . Diastolic CHF ([place])    a. EF 40% by cath, 55-60% during 03/2013 adm, required IV diuresis.  . DVT, lower extremity, recurrent ([place])    a. Hx recurrent DVT per record.  Marland Kitchen  Dyslipidemia   . Elevated CK    a. Pt has refused rheum workup in the past.  . GERD (gastroesophageal reflux disease)   . History of hiatal hernia   . HTN (hypertension)    x 15 years  . Hx of cardiovascular stress test    a. Lexiscan Myoview (03/2010):  diaph atten vs inf scar, no ischemia, EF 47%; Low Risk.  Marland Kitchen Hx of echocardiogram    a. Echo (04/08/13):  Mild LVH, EF 55-60%, restrictive physiology, severe LAE, mild reduced RVSF, mild RAE.  . Leg pain    ABI 6/16:  R 1.2, L 1.1 - normal  . Myocardial infarction ([place])   . Obesity   . Peripheral neuropathy   . Pulmonary embolism ([place])   . Sleep apnea     Past Surgical History:  Procedure Laterality Date  . CORONARY ANGIOPLASTY WITH STENT PLACEMENT    . CORONARY ARTERY BYPASS GRAFT     4 time  since 2002  . LEFT HEART CATHETERIZATION WITH CORONARY/GRAFT ANGIOGRAM  04/05/2013   Procedure: LEFT HEART CATHETERIZATION WITH Beatrix Fetters;  Surgeon: Peter M Martinique, MD;  Location: [facility] CATH LAB;  Service: Cardiovascular;;  . left knee surgery    . LOWER EXTREMITY ANGIOGRAPHY Left 12/25/2016   Procedure: LOWER EXTREMITY ANGIOGRAPHY;  Surgeon: Algernon Huxley, MD;  Location: [facility] CV LAB;  Service: Cardiovascular;  Laterality: Left;  . PERCUTANEOUS CORONARY STENT INTERVENTION (PCI-S)  04/05/2013   Procedure: PERCUTANEOUS CORONARY STENT INTERVENTION (PCI-S);  Surgeon: Peter M Martinique, MD;  [facility] CATH LAB;  Service: Cardiovascular;;  DES to native Mid cx  . VASCULAR SURGERY       Current Outpatient Medications  Medication Sig Dispense Refill  . acetaminophen (TYLENOL) 500 MG tablet Take 1,000 mg every 8 (eight) hours as needed by mouth for mild pain or headache.     Marland Kitchen atorvastatin (LIPITOR) 20 MG tablet Take 1 tablet (20 mg total) by mouth daily. 90 tablet 3  . azelastine (ASTELIN) 0.1 % nasal spray Place into the nose.    . Blood Glucose Monitoring Suppl (FIFTY50 GLUCOSE METER 2.0) w/Device KIT Use as directed.    . furosemide (LASIX) 80 MG tablet Take 1 tablet (80 mg total) by mouth daily. 90 tablet 3  . gabapentin (NEURONTIN) 300 MG capsule Take 600 mg daily as needed by mouth (pain in foot and ankle).     . insulin lispro (HUMALOG KWIKPEN) 100 UNIT/ML injection Inject 22-25 Units 3 (three) times daily before meals into the skin. Per sliding scale    . Insulin Syringe-Needle U-100 31G X 5/16" 0.3 ML MISC use as directed    . isosorbide mononitrate (IMDUR) 30 MG 24 hr tablet Take 1 tablet (30 mg total) by mouth daily. 90 tablet 3  . losartan (COZAAR) 25 MG tablet Take 1 tablet (25 mg total) by mouth daily. 90 tablet 3  . metoprolol succinate (TOPROL-XL) 25 MG 24 hr tablet Take 0.5 tablets (12.5 mg total) by mouth daily. 90 tablet 3  . nitroGLYCERIN (NITROSTAT) 0.4 MG SL  tablet Place 1 tablet (0.4 mg total) under the tongue every 5 (five) minutes as needed for chest pain. NEEDS APPOINTMENT 25 tablet 6  . pantoprazole (PROTONIX) 40 MG tablet TAKE 1 TABLET (40 MG TOTAL) BY MOUTH DAILY AS NEEDED (INDIGESTION). 90 tablet 0  . TRULICITY 8.36 OQ/9.4TM SOPN INJECT 0.75 MG SUBCUTANEOUSLY EVERY 7 (SEVEN) DAYS.  8  . warfarin (COUMADIN) 5 MG tablet Take 1-2 tablets by mouth  daily as directed by coumadin clinic 50 tablet 0   No current facility-administered medications for this visit.     Allergies:   Simvastatin    ROS:  Please see the history of present illness.   Otherwise, review of systems are positive for none.   All other systems are reviewed and negative.    PHYSICAL EXAM: VS:  BP (!) 144/98   Pulse 70   Ht 6' (1.829 m)   Wt 264 lb 6.4 oz (119.9 kg)   BMI 35.86 kg/m  , BMI Body mass index is 35.86 kg/m.  GENERAL:  Well appearing NECK:  No jugular venous distention, waveform within normal limits, carotid upstroke brisk and symmetric, no bruits, no thyromegaly LUNGS:  Clear to auscultation bilaterally CHEST:  Well healed sternotomy scar. HEART:  PMI not displaced or sustained,S1 and S2 within normal limits, no S3, no S4, no clicks, no rubs, no murmurs ABD:  Flat, positive bowel sounds normal in frequency in pitch, no bruits, no rebound, no guarding, no midline pulsatile mass, no hepatomegaly, no splenomegaly EXT:  2 plus pulses throughout, right leg edema, Charcot joint, no cyanosis no clubbing, status post left BKA SKIN: Slight ulcer on the dorsum of his right foot   EKG:  EKG is  ordered today. The ekg ordered today demonstrates sinus rhythm, rate 70, left bundle branch block, no acute ST-T wave changes.   Recent Labs: 02/25/2018: ALT 17; BUN 24; Creatinine, Ser 1.22; Hemoglobin 16.6; Platelets 198; Potassium 4.9; Sodium 135    Lipid Panel    Component Value Date/Time   CHOL 125 08/15/2016 1509   TRIG 365 (H) 08/15/2016 1509   HDL 31 (L)  08/15/2016 1509   CHOLHDL 4.0 08/15/2016 1509   CHOLHDL 3.6 CALC 03/11/2008 0930   VLDL 21 03/11/2008 0930   LDLCALC 21 08/15/2016 1509      Wt Readings from Last 3 Encounters:  04/03/18 264 lb 6.4 oz (119.9 kg)  01/09/17 249 lb (112.9 kg)  01/02/17 249 lb (112.9 kg)      Other studies Reviewed: Additional studies/ records that were reviewed today include:  Labs, Care Everywehere  Review of the above records demonstrates:  Please see elsewhere in the note.     ASSESSMENT AND PLAN:   LOWER EXTREMITY EDEMA:   Managed conservatively.  No change in therapy.  CAD: He has had no overt symptoms.  No further cardiovascular testing is suggested.  He needs continued risk reduction.   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  He does well with this.  No change in therapy.   HTN:    Blood pressure slightly elevated today but this is unusual and I reviewed previous readings.  No change in therapy.   HYPERLIPIDEMIA:     He is overdue for a lipid profile and I will order this.  Goal will be an LDL less than 70.  DM:     He says his A1c is greater than 10 and I do not have this.  He is actively followed at [facility].  He is also managed by Adin Hector, MD   OSA:    He cannot wear his CPAP and does not want to try again.   Current medicines are reviewed at length with the patient today.  The patient does not have concerns regarding medicines.  The following changes have been made:   None  Labs/ tests ordered today include:  Orders Placed This Encounter  Procedures  . Lipid panel  . AMB referral to cardiac rehabilitation  . EKG 12-Lead     Disposition:   FU with APP in six months.       Signed, Minus Breeding, MD  04/03/2018 10:51 AM    [facility]

## 2018-04-01 ENCOUNTER — Ambulatory Visit: Payer: Managed Care, Other (non HMO) | Admitting: Physical Therapy

## 2018-04-03 ENCOUNTER — Other Ambulatory Visit: Payer: Self-pay | Admitting: *Deleted

## 2018-04-03 ENCOUNTER — Ambulatory Visit: Payer: Managed Care, Other (non HMO) | Admitting: Physical Therapy

## 2018-04-03 ENCOUNTER — Encounter: Payer: Self-pay | Admitting: Cardiology

## 2018-04-03 ENCOUNTER — Encounter: Payer: Self-pay | Admitting: Physical Therapy

## 2018-04-03 ENCOUNTER — Ambulatory Visit (INDEPENDENT_AMBULATORY_CARE_PROVIDER_SITE_OTHER): Payer: Managed Care, Other (non HMO) | Admitting: Pharmacist

## 2018-04-03 ENCOUNTER — Ambulatory Visit: Payer: Managed Care, Other (non HMO) | Admitting: Cardiology

## 2018-04-03 VITALS — BP 144/98 | HR 70 | Ht 72.0 in | Wt 264.4 lb

## 2018-04-03 DIAGNOSIS — M6281 Muscle weakness (generalized): Secondary | ICD-10-CM

## 2018-04-03 DIAGNOSIS — I480 Paroxysmal atrial fibrillation: Secondary | ICD-10-CM | POA: Diagnosis not present

## 2018-04-03 DIAGNOSIS — Z5181 Encounter for therapeutic drug level monitoring: Secondary | ICD-10-CM | POA: Diagnosis not present

## 2018-04-03 DIAGNOSIS — Z7901 Long term (current) use of anticoagulants: Secondary | ICD-10-CM | POA: Diagnosis not present

## 2018-04-03 DIAGNOSIS — R2681 Unsteadiness on feet: Secondary | ICD-10-CM

## 2018-04-03 DIAGNOSIS — I25810 Atherosclerosis of coronary artery bypass graft(s) without angina pectoris: Secondary | ICD-10-CM

## 2018-04-03 DIAGNOSIS — I5032 Chronic diastolic (congestive) heart failure: Secondary | ICD-10-CM

## 2018-04-03 DIAGNOSIS — I5033 Acute on chronic diastolic (congestive) heart failure: Secondary | ICD-10-CM

## 2018-04-03 DIAGNOSIS — I2119 ST elevation (STEMI) myocardial infarction involving other coronary artery of inferior wall: Secondary | ICD-10-CM

## 2018-04-03 DIAGNOSIS — E785 Hyperlipidemia, unspecified: Secondary | ICD-10-CM | POA: Diagnosis not present

## 2018-04-03 DIAGNOSIS — I214 Non-ST elevation (NSTEMI) myocardial infarction: Secondary | ICD-10-CM

## 2018-04-03 DIAGNOSIS — Z86718 Personal history of other venous thrombosis and embolism: Secondary | ICD-10-CM

## 2018-04-03 DIAGNOSIS — I2510 Atherosclerotic heart disease of native coronary artery without angina pectoris: Secondary | ICD-10-CM | POA: Diagnosis not present

## 2018-04-03 DIAGNOSIS — Z86711 Personal history of pulmonary embolism: Secondary | ICD-10-CM

## 2018-04-03 DIAGNOSIS — R2689 Other abnormalities of gait and mobility: Secondary | ICD-10-CM

## 2018-04-03 LAB — POCT INR: INR: 2 (ref 2.0–3.0)

## 2018-04-03 MED ORDER — ISOSORBIDE MONONITRATE ER 30 MG PO TB24: 30.0000 mg | ORAL_TABLET | Freq: Every day | ORAL | 3 refills | Status: DC

## 2018-04-03 MED ORDER — METOPROLOL SUCCINATE ER 25 MG PO TB24: 12.5000 mg | ORAL_TABLET | Freq: Every day | ORAL | 3 refills | Status: DC

## 2018-04-03 MED ORDER — ATORVASTATIN CALCIUM 20 MG PO TABS: 20.0000 mg | ORAL_TABLET | Freq: Every day | ORAL | 3 refills | Status: DC

## 2018-04-03 MED ORDER — LOSARTAN POTASSIUM 25 MG PO TABS: 25.0000 mg | ORAL_TABLET | Freq: Every day | ORAL | 3 refills | Status: DC

## 2018-04-03 MED ORDER — FUROSEMIDE 80 MG PO TABS: 80.0000 mg | ORAL_TABLET | Freq: Every day | ORAL | 3 refills | Status: DC

## 2018-04-03 MED ORDER — WARFARIN SODIUM 5 MG PO TABS: ORAL_TABLET | ORAL | 0 refills | Status: DC

## 2018-04-03 NOTE — Patient Instructions (Signed)
Medication Instructions:  Continue current medications  If you need a refill on your cardiac medications before your next appointment, please call your pharmacy.  Labwork: Fasting Lipids HERE IN OUR OFFICE AT [facility]  You will need to fast. DO NOT EAT OR DRINK PAST MIDNIGHT.     You will NOT need to fast   Take the provided lab slips with you to the lab for your blood draw.   When you have your labs (blood work) drawn today and your tests are completely normal, you will receive your results only by MyChart Message (if you have MyChart) -OR-  A paper copy in the mail.  If you have any lab test that is abnormal or we need to change your treatment, we will call you to review these results.  Testing/Procedures: None Ordered   Follow-Up: You will need a follow up appointment in 6 months.  Please call our office 2 months in advance to schedule this appointment.  You may see one of the following Advanced Practice Providers on your designated Care Team:   Rosaria Ferries, PA-C . Jory Sims, DNP, ANP  At [facility], you and your health needs are our priority.  As part of our continuing mission to provide you with exceptional heart care, we have created designated Provider Care Teams.  These Care Teams include your primary Cardiologist (physician) and Advanced Practice Providers (APPs -  Physician Assistants and Nurse Practitioners) who all work together to provide you with the care you need, when you need it.  Thank you for choosing [facility] at [facility]!!

## 2018-04-04 NOTE — Therapy (Signed)
[facility] [address] [place], [place], [address] Phone: [phone]   Fax:  [phone]  Physical Therapy Treatment  Patient Details  Name: Bobby Lindsey MRN: [id number] Date of Birth: Jan 09, 1956 Referring Provider (PT): Rhina Brackett, MD   Encounter Date: 04/03/2018  PT End of Session - 04/03/18 1321    Visit Number  24    Number of Visits  30    Authorization Type  [employer]. 30 visit per year.    Authorization - Visit Number  10    Authorization - Number of Visits  20    PT Start Time  6962    PT Stop Time  1400    PT Time Calculation (min)  42 min    Equipment Utilized During Treatment  Gait belt    Activity Tolerance  Patient tolerated treatment well;Patient limited by pain;Patient limited by fatigue    Behavior During Therapy  [facility] for tasks assessed/performed       Past Medical History:  Diagnosis Date  . Anginal pain ([place])    last pm  . Atrial fibrillation ([place])    a. Transient during 03/2013 admission.  . Bradycardia    a. Bradycardia/pauses/possible Mobitz II during 03/2013 adm. Not on BB due to this.  Marland Kitchen CAD (coronary artery disease)    a. s/p CABG 2002. b. Hx Cypher stent to the RCA. c. Inf-lat STEMI 03/2013:  LHC (04/05/13):  mLAD occluded, pD1 90, apical br of Dx occluded, CFX occluded, pOM1 90-95, RCA stents patent, diff RCA 30, S-Dx occluded, S-PDA occluded, S-OM1 40-50, L-LAD patent, EF 40% with inf HK.  PCI:  Promus (2.5 x 28) DES to mid to dist CFX.  Marland Kitchen Charcot's joint of knee   . COPD (chronic obstructive pulmonary disease) ([place])   . Deep venous thrombosis (HCC)    right lower extremity  . Diabetes mellitus    a. A1C 10.7 in 03/2013.  . Diastolic CHF ([place])    a. EF 40% by cath, 55-60% during 03/2013 adm, required IV diuresis.  . DVT, lower extremity, recurrent ([place])    a. Hx recurrent DVT per record.  . Dyslipidemia   . Elevated CK    a. Pt has refused rheum workup in the past.  .  GERD (gastroesophageal reflux disease)   . History of hiatal hernia   . HTN (hypertension)    x 15 years  . Hx of cardiovascular stress test    a. Lexiscan Myoview (03/2010):  diaph atten vs inf scar, no ischemia, EF 47%; Low Risk.  Marland Kitchen Hx of echocardiogram    a. Echo (04/08/13):  Mild LVH, EF 55-60%, restrictive physiology, severe LAE, mild reduced RVSF, mild RAE.  . Leg pain    ABI 6/16:  R 1.2, L 1.1 - normal  . Myocardial infarction ([place])   . Obesity   . Peripheral neuropathy   . Pulmonary embolism ([place])   . Sleep apnea     Past Surgical History:  Procedure Laterality Date  . CORONARY ANGIOPLASTY WITH STENT PLACEMENT    . CORONARY ARTERY BYPASS GRAFT     4 time since 2002  . LEFT HEART CATHETERIZATION WITH CORONARY/GRAFT ANGIOGRAM  04/05/2013   Procedure: LEFT HEART CATHETERIZATION WITH Beatrix Fetters;  Surgeon: Peter M Martinique, MD;  Location: [facility] CATH LAB;  Service: Cardiovascular;;  . left knee surgery    . LOWER EXTREMITY ANGIOGRAPHY Left 12/25/2016   Procedure: LOWER EXTREMITY ANGIOGRAPHY;  Surgeon: Algernon Huxley, MD;  Location: [facility] CV LAB;  Service: Cardiovascular;  Laterality: Left;  . PERCUTANEOUS CORONARY STENT INTERVENTION (PCI-S)  04/05/2013   Procedure: PERCUTANEOUS CORONARY STENT INTERVENTION (PCI-S);  Surgeon: Peter M Martinique, MD;  Location: [facility] CATH LAB;  Service: Cardiovascular;;  DES to native Mid cx  . VASCULAR SURGERY      There were no vitals filed for this visit.  Subjective Assessment - 04/03/18 1319    Subjective  Forgot his rollator, so has been using his cane all day (had an appt with the Cardiologist this am). The doctor wants him back into cardiac rehab.     Pertinent History  L TTA, DM2, perepheral neuropathy, PVD, CAD, CABG X3, CHF, DVTs, HTN, HLD, A-Fib,     Limitations  Standing;House hold activities;Walking    How long can you sit comfortably?  as long as he wants to sit    How long can you stand comfortably?  unable    How long can  you walk comfortably?  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  No/denies    Pain Score  0-No pain           [facility] Adult PT Treatment/Exercise - 04/03/18 1322      Transfers   Transfers  Sit to Stand;Stand to Sit    Sit to Stand  5: Supervision;With upper extremity assist;With armrests;From chair/3-in-1    Stand to Sit  5: Supervision;With upper extremity assist;With armrests;To chair/3-in-1      Ambulation/Gait   Ambulation/Gait  Yes    Ambulation/Gait Assistance  5: Supervision;4: Min guard    Ambulation/Gait Assistance Details  cues on posture, step length and weight shifting with gait.     Ambulation Distance (Feet)  --   into/out of/around gym    Assistive device  Straight cane;Prosthesis    Gait Pattern  Step-through pattern;Decreased arm swing - left;Decreased step length - right;Decreased stance time - left;Decreased stride length;Decreased hip/knee flexion - left;Decreased weight shift to left;Left flexed knee in stance;Antalgic;Trunk flexed;Abducted - left    Ambulation Surface  Level;Indoor      High Level Balance   High Level Balance Activities  Side stepping;Marching forwards;Marching backwards    High Level Balance Comments  in parallel bars with single to no UE support: 3 laps each with min guard to min assist for balance. cues on posture and weight shifting.       Prosthetics   Current prosthetic wear tolerance (days/week)   daily    Current prosthetic wear tolerance (#hours/day)   most of awake hours    Residual limb condition   intact per pt report    Donning Prosthesis  Modified independent (device/increased time)    Doffing Prosthesis  Modified independent (device/increased time)          Balance Exercises - 04/03/18 1352      Balance Exercises: Standing   Standing Eyes Closed  Wide (BOA);Head turns;Foam/compliant surface;Other reps (comment);30  secs;Limitations      Balance Exercises: Standing   Standing Eyes Closed Limitations  on blue airex in parallel bars: wide base of support for EC no head movements, progressing to EC head movements left<>right, then up<>down. min guard to min assist for balance.           PT Short Term Goals - 03/18/18 1325      PT SHORT TERM GOAL #1   Title  Patient verbalizes proper skin management for residual limb with use of prosthesis. (All updated STGs Target Date: 03/15/2018)    Baseline  MET 03/11/2018    Status  Achieved      PT SHORT TERM GOAL #2   Title  Patient reports back pain increases <4 increments on 0-10 scale with gait & standing activities.     Baseline  03/18/18: met with activity in session today    Status  Achieved      PT SHORT TERM GOAL #3   Title  Patient ambulates 79' with cane & prosthesis scanning environment with supervision.     Baseline  03/18/18: pt needed up to min guard to min assist and was not able to meet the distance due to back pain.     Status  Not Met      PT SHORT TERM GOAL #4   Title  Patient negotiates ramps, curbs with cane & stairs with 1 rail/cane with prosthesis with minimal assist.     Baseline  03/18/18: met today with cane/prosthesis    Status  Achieved      PT SHORT TERM GOAL #5   Title  Patient able to stand 2 minutes without UE support safely.     Baseline  MET 03/11/2018    Status  Achieved        PT Long Term Goals - 03/11/18 2232      PT LONG TERM GOAL #1   Title  Patient verbalizes proper prosthetic care including problem solving new issues and tolerates wear >90% of awake hours without issues. (All updated LTGs Target Date: 04/12/2018)    Time  2    Period  Months    Status  On-going    Target Date  04/12/18      PT LONG TERM GOAL #2   Title  Patient reports back pain increases </= 2 increments on 0-10 scale with standing & gait activities and verbalizes proper use of assistive devices to maximize function without significant pain  increases.     Time  2    Period  Months    Status  On-going    Target Date  04/12/18      PT LONG TERM GOAL #3   Title  Berg Balance >36/56 to indicate lower fall risk & less dependency in standing ADLs.     Time  3    Period  Months    Status  On-going    Target Date  04/12/18      PT LONG TERM GOAL #4   Title  Patient ambulates 200' including paved surfaces with cane & prosthesis modified indepenent.     Time  2    Period  Months    Status  On-going    Target Date  04/12/18      PT LONG TERM GOAL #5   Title  Patient negotiates ramps, curbs & stairs 1 rail with cane & prosthesis modified independent to enable community access.     Time  2    Period  Months    Status  On-going    Target Date  04/12/18            Plan - 04/03/18 1322    Clinical Impression Statement  Today's skilled session continued to address gait with cane/prosthesis and began to address balance reactions. Pt was limited today by back pain, needing short rest breaks. Pt also reporting mild dizziness today and felt this was due to his blood  sugar being up. Spouse brought his meter in about half way through session with his blood sugar level 311. Both stated this was high, however not too far from where he runs at times. He plans to go home to take insulin as needed. Pt's BP also checked and found to be 137/81. Monitored his dizziness with session with no change reported by pt during session.     Rehab Potential  Good    Clinical Impairments Affecting Rehab Potential  weakness BUEs, BLEs & trunk, impaired balance with high fall risk, dependent gait with prosthesis, dependent in proper prosthetic care & use    PT Frequency  2x / week    PT Duration  8 weeks    PT Treatment/Interventions  ADLs/Self Care Home Management;Canalith Repostioning;DME Instruction;Gait training;Stair training;Functional mobility training;Therapeutic activities;Therapeutic exercise;Balance training;Patient/family education;Neuromuscular  re-education;Prosthetic Training;Manual techniques;Vestibular    PT Next Visit Plan  prosthetic gait household no device, limited community cane & longer distances with rollator; continue to address core strengthening/posture as able to work toward decr'd back pain with activity     PT Home Exercise Plan  Access Code: [email or web address]     Consulted and Agree with Plan of Care  Patient       Patient will benefit from skilled therapeutic intervention in order to improve the following deficits and impairments:  Abnormal gait, Decreased activity tolerance, Decreased balance, Decreased coordination, Decreased endurance, Decreased knowledge of use of DME, Decreased mobility, Decreased range of motion, Decreased strength, Dizziness, Impaired flexibility, Prosthetic Dependency, Postural dysfunction, Pain, Decreased skin integrity  Visit Diagnosis: Muscle weakness (generalized)  Other abnormalities of gait and mobility  Unsteadiness on feet     Problem List Patient Active Problem List   Diagnosis Date Noted  . SOB (shortness of breath) 12/06/2017  . Uncontrolled type 2 diabetes mellitus with insulin therapy ([place]) 12/06/2017  . Primary osteoarthritis of left knee 11/02/2017  . Open wound 06/05/2017  . Diabetic ulcer of left lower leg ([place]) 05/16/2017  . Acquired absence of left leg below knee ([place]) 02/28/2017  . Impaired mobility and activities of daily living 02/15/2017  . Atherosclerotic heart disease of native coronary artery without angina pectoris 01/05/2017  . HTN (hypertension) 12/19/2016  . Atherosclerosis of native arteries of the extremities with ulceration ([place]) 12/19/2016  . Peripheral vascular disease ([place]) 12/07/2016  . CHF (congestive heart failure) ([place]) 11/24/2016  . Demand ischemia ([place])   . 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 NSTEMI (non-ST elevated myocardial infarction) ([place])  11/23/2016  . Acute diastolic CHF (congestive heart failure) ([place]) 11/23/2016  . Acute congestive heart failure ([place])   . Burn of left foot 11/21/2016  . Diabetic peripheral neuropathy ([place]) 11/21/2016  . Diabetic ulcer of right foot associated with type 2 diabetes mellitus, with fat layer exposed ([place]) 11/21/2016  . DDD (degenerative disc disease), lumbar 03/27/2016  . History of deep vein thrombosis (DVT) of lower extremity 03/26/2016  . History of pulmonary embolism 10/01/2015  . Nephropathy, diabetic ([place]) 10/01/2015  . 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 Chronic obstructive pulmonary disease, unspecified ([place]) 12/16/2013  . Medication management 04/14/2013  . History of recurrent DVT/E 04/11/2013  . Sleep apnea 04/11/2013  . Bradycardia 04/11/2013  . History of Elevated CK level  04/11/2013  . Class 1 obesity due to excess calories with serious comorbidity and body mass index (BMI) of 34.0 to 34.9 in adult 04/11/2013  .  Peripheral neuropathy ([place])   . Charcot's arthropathy   . Chronic diastolic heart failure ([place]) 04/08/2013  . Paroxysmal atrial fibrillation ([place]) 04/07/2013  . Reactive depression (situational) 05/16/2011  . Long term (current) use of anticoagulants 09/21/2010  . 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 ED (erectile dysfunction) of organic origin 07/28/2010  . Poorly controlled type 2 diabetes mellitus with peripheral neuropathy ([place]) 05/26/2008  . Hyperlipidemia 05/26/2008  . Hypertensive heart disease 05/26/2008    Willow Ora, PTA, [place] [address], [place] [employer], [place] [address] [phone] 04/04/18, 1:25 PM   Name: Bobby Lindsey MRN: [id number] Date of Birth: [birth year]

## 2018-04-08 ENCOUNTER — Encounter: Payer: Self-pay | Admitting: Physical Therapy

## 2018-04-08 ENCOUNTER — Ambulatory Visit: Payer: Managed Care, Other (non HMO) | Admitting: Physical Therapy

## 2018-04-08 VITALS — BP 155/75

## 2018-04-08 DIAGNOSIS — R2689 Other abnormalities of gait and mobility: Secondary | ICD-10-CM

## 2018-04-08 DIAGNOSIS — R293 Abnormal posture: Secondary | ICD-10-CM

## 2018-04-08 DIAGNOSIS — R2681 Unsteadiness on feet: Secondary | ICD-10-CM

## 2018-04-08 DIAGNOSIS — M6281 Muscle weakness (generalized): Secondary | ICD-10-CM | POA: Diagnosis not present

## 2018-04-08 NOTE — Therapy (Signed)
[facility] [address] [place], [place], [address] Phone: [phone]   Fax:  [phone]  Physical Therapy Treatment  Patient Details  Name: Bobby Lindsey MRN: [id number] Date of Birth: 21-Mar-1955 Referring Provider (PT): Rhina Brackett, MD   Encounter Date: 04/08/2018  PT End of Session - 04/08/18 1246    Visit Number  25    Number of Visits  30    Authorization Type  [employer]. 30 visit per year.    Authorization - Visit Number  10    Authorization - Number of Visits  20    PT Start Time  1155    PT Stop Time  1233    PT Time Calculation (min)  38 min    Equipment Utilized During Treatment  Gait belt    Activity Tolerance  Patient tolerated treatment well;Patient limited by pain;Patient limited by fatigue    Behavior During Therapy  [facility] for tasks assessed/performed       Past Medical History:  Diagnosis Date  . Anginal pain ([place])    last pm  . Atrial fibrillation ([place])    a. Transient during 03/2013 admission.  . Bradycardia    a. Bradycardia/pauses/possible Mobitz II during 03/2013 adm. Not on BB due to this.  Marland Kitchen CAD (coronary artery disease)    a. s/p CABG 2002. b. Hx Cypher stent to the RCA. c. Inf-lat STEMI 03/2013:  LHC (04/05/13):  mLAD occluded, pD1 90, apical br of Dx occluded, CFX occluded, pOM1 90-95, RCA stents patent, diff RCA 30, S-Dx occluded, S-PDA occluded, S-OM1 40-50, L-LAD patent, EF 40% with inf HK.  PCI:  Promus (2.5 x 28) DES to mid to dist CFX.  Marland Kitchen Charcot's joint of knee   . COPD (chronic obstructive pulmonary disease) ([place])   . Deep venous thrombosis (H[facility])    right lower extremity  . Diabetes mellitus    a. A1C 10.7 in 03/2013.  . Diastolic CHF ([place])    a. EF 40% by cath, 55-60% during 03/2013 adm, required IV diuresis.  . DVT, lower extremity, recurrent ([place])    a. Hx recurrent DVT per record.  . Dyslipidemia   . Elevated CK    a. Pt has refused rheum workup in the past.  .  GERD (gastroesophageal reflux disease)   . History of hiatal hernia   . HTN (hypertension)    x 15 years  . Hx of cardiovascular stress test    a. Lexiscan Myoview (03/2010):  diaph atten vs inf scar, no ischemia, EF 47%; Low Risk.  Marland Kitchen Hx of echocardiogram    a. Echo (04/08/13):  Mild LVH, EF 55-60%, restrictive physiology, severe LAE, mild reduced RVSF, mild RAE.  . Leg pain    ABI 6/16:  R 1.2, L 1.1 - normal  . Myocardial infarction ([place])   . Obesity   . Peripheral neuropathy   . Pulmonary embolism ([place])   . Sleep apnea     Past Surgical History:  Procedure Laterality Date  . CORONARY ANGIOPLASTY WITH STENT PLACEMENT    . CORONARY ARTERY BYPASS GRAFT     4 time since 2002  . LEFT HEART CATHETERIZATION WITH CORONARY/GRAFT ANGIOGRAM  04/05/2013   Procedure: LEFT HEART CATHETERIZATION WITH Beatrix Fetters;  Surgeon: Peter M Martinique, MD;  Location: [facility] CATH LAB;  Service: Cardiovascular;;  . left knee surgery    . LOWER EXTREMITY ANGIOGRAPHY Left 12/25/2016   Procedure: LOWER EXTREMITY ANGIOGRAPHY;  Surgeon: Algernon Huxley, MD;  Location: [facility] CV LAB;  Service: Cardiovascular;  Laterality: Left;  . PERCUTANEOUS CORONARY STENT INTERVENTION (PCI-S)  04/05/2013   Procedure: PERCUTANEOUS CORONARY STENT INTERVENTION (PCI-S);  Surgeon: Peter M Martinique, MD;  Location: [facility] CATH LAB;  Service: Cardiovascular;;  DES to native Mid cx  . VASCULAR SURGERY      Vitals:   04/08/18 1157  BP: (!) 155/75    Subjective Assessment - 04/08/18 1157    Subjective  Brought cane today vs. rollator due to it having fresh mud on it.    Pertinent History  L TTA, DM2, perepheral neuropathy, PVD, CAD, CABG X3, CHF, DVTs, HTN, HLD, A-Fib,     Limitations  Standing;House hold activities;Walking    How long can you sit comfortably?  as long as he wants to sit    How long can you stand comfortably?  unable    How long can you walk comfortably?  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  Yes    Pain Score  4     Pain Location  Back    Pain Orientation  Mid    Pain Type  Chronic pain    Pain Onset  More than a month ago    Pain Frequency  Intermittent                       [facility] Adult PT Treatment/Exercise - 04/08/18 0001      Transfers   Transfers  Sit to Stand;Stand to Sit    Sit to Stand  5: Supervision   cues to fully turn to sit.   Stand to Sit  5: Supervision;With upper extremity assist;With armrests;To chair/3-in-1;Without upper extremity assist      Ambulation/Gait   Ambulation/Gait  Yes    Ambulation/Gait Assistance  5: Supervision    Ambulation/Gait Assistance Details  cues for posture. Cane with community barriers. without cane for household distance with obstacle negotiation (practised figure 8 turning)                                   Ambulation Distance (Feet)  100 Feet   with cane + 60 without SPC   Assistive device  Straight cane;Prosthesis    Gait Pattern  Step-through pattern;Decreased arm swing - left;Decreased step length - right;Decreased stance time - left;Decreased stride length;Decreased hip/knee flexion - left;Decreased weight shift to left;Left flexed knee in stance;Antalgic;Trunk flexed;Abducted - left    Ambulation Surface  Level;Indoor    Ramp  5: Supervision    Ramp Details (indicate cue type and reason)  with cane    Curb  4: Min assist    Curb Details (indicate cue type and reason)  with SPC; pt required min A (HHA) due to Legs feeling weak)      Knee/Hip Exercises: Aerobic   Other Aerobic  Scifit level 2.0, LE and UEs, cues for hip adductor activation to control LE movement,  Attempted to keep RPMs around 65, but pt was fatigued during  session and had multiple quick stops, continued to 9 min.  Prosthetics   Current prosthetic wear tolerance (days/week)   daily    Current prosthetic wear  tolerance (#hours/day)   most of awake hours    Residual limb condition   intact per pt report          Balance Exercises - 04/08/18 1222      Balance Exercises: Standing   Retro Gait  Upper extremity support;4 reps   cues for posture, steplength, intermittent UE support.   Sidestepping  4 reps    Marching Limitations  with 1 UE support.          PT Short Term Goals - 03/18/18 1325      PT SHORT TERM GOAL #1   Title  Patient verbalizes proper skin management for residual limb with use of prosthesis. (All updated STGs Target Date: 03/15/2018)    Baseline  MET 03/11/2018    Status  Achieved      PT SHORT TERM GOAL #2   Title  Patient reports back pain increases <4 increments on 0-10 scale with gait & standing activities.     Baseline  03/18/18: met with activity in session today    Status  Achieved      PT SHORT TERM GOAL #3   Title  Patient ambulates 32' with cane & prosthesis scanning environment with supervision.     Baseline  03/18/18: pt needed up to min guard to min assist and was not able to meet the distance due to back pain.     Status  Not Met      PT SHORT TERM GOAL #4   Title  Patient negotiates ramps, curbs with cane & stairs with 1 rail/cane with prosthesis with minimal assist.     Baseline  03/18/18: met today with cane/prosthesis    Status  Achieved      PT SHORT TERM GOAL #5   Title  Patient able to stand 2 minutes without UE support safely.     Baseline  MET 03/11/2018    Status  Achieved        PT Long Term Goals - 03/11/18 2232      PT LONG TERM GOAL #1   Title  Patient verbalizes proper prosthetic care including problem solving new issues and tolerates wear >90% of awake hours without issues. (All updated LTGs Target Date: 04/12/2018)    Time  2    Period  Months    Status  On-going    Target Date  04/12/18      PT LONG TERM GOAL #2   Title  Patient reports back pain increases </= 2 increments on 0-10 scale with standing & gait activities and  verbalizes proper use of assistive devices to maximize function without significant pain increases.     Time  2    Period  Months    Status  On-going    Target Date  04/12/18      PT LONG TERM GOAL #3   Title  Berg Balance >36/56 to indicate lower fall risk & less dependency in standing ADLs.     Time  3    Period  Months    Status  On-going    Target Date  04/12/18      PT LONG TERM GOAL #4   Title  Patient ambulates 200' including paved surfaces with cane & prosthesis modified indepenent.     Time  2    Period  Months    Status  On-going  Target Date  04/12/18      PT LONG TERM GOAL #5   Title  Patient negotiates ramps, curbs & stairs 1 rail with cane & prosthesis modified independent to enable community access.     Time  2    Period  Months    Status  On-going    Target Date  04/12/18            Plan - 04/08/18 1215    Clinical Impression Statement  Skilled session focused LE strengthening, gait with [facility] negotiating community, no device with household type barriers, and standing balance.  Pt continues to need intermittent UE support for dynamic gait without SPC.  Pt required HHA (min A) for curb negotiation with SPC reporting that LEs felt weak.                                                                                  Rehab Potential  Good    Clinical Impairments Affecting Rehab Potential  weakness BUEs, BLEs & trunk, impaired balance with high fall risk, dependent gait with prosthesis, dependent in proper prosthetic care & use    PT Frequency  2x / week    PT Duration  8 weeks    PT Treatment/Interventions  ADLs/Self Care Home Management;Canalith Repostioning;DME Instruction;Gait training;Stair training;Functional mobility training;Therapeutic activities;Therapeutic exercise;Balance training;Patient/family education;Neuromuscular re-education;Prosthetic Training;Manual techniques;Vestibular    PT Next Visit Plan  Check LTGs, schedule?    PT Home Exercise Plan   Access Code: [email or web address]     Consulted and Agree with Plan of Care  Patient       Patient will benefit from skilled therapeutic intervention in order to improve the following deficits and impairments:  Abnormal gait, Decreased activity tolerance, Decreased balance, Decreased coordination, Decreased endurance, Decreased knowledge of use of DME, Decreased mobility, Decreased range of motion, Decreased strength, Dizziness, Impaired flexibility, Prosthetic Dependency, Postural dysfunction, Pain, Decreased skin integrity  Visit Diagnosis: Muscle weakness (generalized)  Other abnormalities of gait and mobility  Unsteadiness on feet  Abnormal posture     Problem List Patient Active Problem List   Diagnosis Date Noted  . SOB (shortness of breath) 12/06/2017  . Uncontrolled type 2 diabetes mellitus with insulin therapy ([place]) 12/06/2017  . Primary osteoarthritis of left knee 11/02/2017  . Open wound 06/05/2017  . Diabetic ulcer of left lower leg ([place]) 05/16/2017  . Acquired absence of left leg below knee ([place]) 02/28/2017  . Impaired mobility and activities of daily living 02/15/2017  . Atherosclerotic heart disease of native coronary artery without angina pectoris 01/05/2017  . HTN (hypertension) 12/19/2016  . Atherosclerosis of native arteries of the extremities with ulceration ([place]) 12/19/2016  . Peripheral vascular disease ([place]) 12/07/2016  . CHF (congestive heart failure) ([place]) 11/24/2016  . Demand ischemia ([place])   . 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 Stage 3 chronic kidney disease due to diabetes mellitus ([place]) 11/23/2016  . NSTEMI (non-ST elevated myocardial infarction) ([place]) 11/23/2016  . Acute diastolic CHF (congestive heart failure) ([place]) 11/23/2016  . Acute congestive heart failure ([place])   . Burn of left foot 11/21/2016  . Diabetic peripheral neuropathy ([place]) 11/21/2016  . Diabetic ulcer of  right foot associated with type 2 diabetes mellitus, with fat layer  exposed ([place]) 11/21/2016  . DDD (degenerative disc disease), lumbar 03/27/2016  . History of deep vein thrombosis (DVT) of lower extremity 03/26/2016  . History of pulmonary embolism 10/01/2015  . Nephropathy, diabetic ([place]) 10/01/2015  . 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 Chronic obstructive pulmonary disease, unspecified ([place]) 12/16/2013  . Medication management 04/14/2013  . History of recurrent DVT/E 04/11/2013  . Sleep apnea 04/11/2013  . Bradycardia 04/11/2013  . History of Elevated CK level  04/11/2013  . Class 1 obesity due to excess calories with serious comorbidity and body mass index (BMI) of 34.0 to 34.9 in adult 04/11/2013  . Peripheral neuropathy ([place])   . Charcot's arthropathy   . Chronic diastolic heart failure ([place]) 04/08/2013  . Paroxysmal atrial fibrillation ([place]) 04/07/2013  . Reactive depression (situational) 05/16/2011  . Long term (current) use of anticoagulants 09/21/2010  . 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 ED (erectile dysfunction) of organic origin 07/28/2010  . Poorly controlled type 2 diabetes mellitus with peripheral neuropathy ([place]) 05/26/2008  . Hyperlipidemia 05/26/2008  . Hypertensive heart disease 05/26/2008    Bjorn Loser, PTA  04/08/18, 12:52 PM [facility] [address] [place], [place], [address] Phone: [phone]   Fax:  [phone]  Name: LYNTON CRESCENZO MRN: [id number] Date of Birth: Aug 01, 1955

## 2018-04-10 ENCOUNTER — Encounter: Payer: Self-pay | Admitting: Physical Therapy

## 2018-04-10 ENCOUNTER — Ambulatory Visit: Payer: Managed Care, Other (non HMO) | Admitting: Physical Therapy

## 2018-04-10 DIAGNOSIS — R42 Dizziness and giddiness: Secondary | ICD-10-CM

## 2018-04-10 DIAGNOSIS — M6281 Muscle weakness (generalized): Secondary | ICD-10-CM

## 2018-04-10 DIAGNOSIS — R293 Abnormal posture: Secondary | ICD-10-CM

## 2018-04-10 DIAGNOSIS — R2681 Unsteadiness on feet: Secondary | ICD-10-CM

## 2018-04-10 DIAGNOSIS — R2689 Other abnormalities of gait and mobility: Secondary | ICD-10-CM

## 2018-04-11 NOTE — Therapy (Signed)
[facility] [address] [place], [place], [address] Phone: [phone]   Fax:  [phone]  Physical Therapy Treatment & Discharge Summary  Patient Details  Name: Bobby Lindsey MRN: [id number] Date of Birth: [birth year] Referring Provider (PT): Rhina Brackett, MD   Encounter Date: 04/10/2018   PHYSICAL THERAPY DISCHARGE SUMMARY  Visits from Start of Care: 26  Current functional level related to goals / functional outcomes: See below   Remaining deficits: See assessment below   Education / Equipment: Prosthetic care, varying assistive device to manage pain & maximize mobility, HEP.  Pt purchased rollator walker.   Plan: Patient agrees to discharge.  Patient goals were partially met. Patient is being discharged due to meeting the stated rehab goals.  ?????          PT End of Session - 04/10/18 1410    Visit Number  26    Number of Visits  30    Authorization Type  [employer]. 30 visit per year.    Authorization - Visit Number  11    Authorization - Number of Visits  20    PT Start Time  3151    PT Stop Time  1400    PT Time Calculation (min)  45 min    Equipment Utilized During Treatment  Gait belt    Activity Tolerance  Patient tolerated treatment well;Patient limited by pain;Patient limited by fatigue    Behavior During Therapy  [facility] for tasks assessed/performed       Past Medical History:  Diagnosis Date  . Anginal pain ([place])    last pm  . Atrial fibrillation ([place])    a. Transient during 03/2013 admission.  . Bradycardia    a. Bradycardia/pauses/possible Mobitz II during 03/2013 adm. Not on BB due to this.  Marland Kitchen CAD (coronary artery disease)    a. s/p CABG 2002. b. Hx Cypher stent to the RCA. c. Inf-lat STEMI 03/2013:  LHC (04/05/13):  mLAD occluded, pD1 90, apical br of Dx occluded, CFX occluded, pOM1 90-95, RCA stents patent, diff RCA 30, S-Dx occluded, S-PDA occluded, S-OM1 40-50, L-LAD patent, EF  40% with inf HK.  PCI:  Promus (2.5 x 28) DES to mid to dist CFX.  Marland Kitchen Charcot's joint of knee   . COPD (chronic obstructive pulmonary disease) ([place])   . Deep venous thrombosis (HCC)    right lower extremity  . Diabetes mellitus    a. A1C 10.7 in 03/2013.  . Diastolic CHF ([place])    a. EF 40% by cath, 55-60% during 03/2013 adm, required IV diuresis.  . DVT, lower extremity, recurrent ([place])    a. Hx recurrent DVT per record.  . Dyslipidemia   . Elevated CK    a. Pt has refused rheum workup in the past.  . GERD (gastroesophageal reflux disease)   . History of hiatal hernia   . HTN (hypertension)    x 15 years  . Hx of cardiovascular stress test    a. Lexiscan Myoview (03/2010):  diaph atten vs inf scar, no ischemia, EF 47%; Low Risk.  Marland Kitchen Hx of echocardiogram    a. Echo (04/08/13):  Mild LVH, EF 55-60%, restrictive physiology, severe LAE, mild reduced RVSF, mild RAE.  . Leg pain    ABI 6/16:  R 1.2, L 1.1 - normal  . Myocardial infarction ([place])   . Obesity   . Peripheral neuropathy   . Pulmonary embolism ([place])   . Sleep apnea  Past Surgical History:  Procedure Laterality Date  . CORONARY ANGIOPLASTY WITH STENT PLACEMENT    . CORONARY ARTERY BYPASS GRAFT     4 time since 2002  . LEFT HEART CATHETERIZATION WITH CORONARY/GRAFT ANGIOGRAM  04/05/2013   Procedure: LEFT HEART CATHETERIZATION WITH Beatrix Fetters;  Surgeon: Peter M Martinique, MD;  Location: [facility] CATH LAB;  Service: Cardiovascular;;  . left knee surgery    . LOWER EXTREMITY ANGIOGRAPHY Left 12/25/2016   Procedure: LOWER EXTREMITY ANGIOGRAPHY;  Surgeon: Algernon Huxley, MD;  Location: [facility] CV LAB;  Service: Cardiovascular;  Laterality: Left;  . PERCUTANEOUS CORONARY STENT INTERVENTION (PCI-S)  04/05/2013   Procedure: PERCUTANEOUS CORONARY STENT INTERVENTION (PCI-S);  Surgeon: Peter M Martinique, MD;  Location: [facility] CATH LAB;  Service: Cardiovascular;;  DES to native Mid cx  . VASCULAR SURGERY      There were no vitals  filed for this visit.  Subjective Assessment - 04/10/18 1315    Subjective  He is wearing prosthesis daily most of awake hours. No falls.     Pertinent History  L TTA, DM2, perepheral neuropathy, PVD, CAD, CABG X3, CHF, DVTs, HTN, HLD, A-Fib,     Limitations  Standing;House hold activities;Walking    How long can you sit comfortably?  as long as he wants to sit    How long can you stand comfortably?  unable    How long can you walk comfortably?  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  Yes    Pain Score  8     Pain Location  Back    Pain Orientation  Right;Left    Pain Descriptors / Indicators  Aching;Sore    Pain Type  Chronic pain    Pain Onset  More than a month ago    Aggravating Factors   increased walking without rollator    Pain Relieving Factors  sitting down for rest         [facility] PT Assessment - 04/10/18 1315      Assessment   Medical Diagnosis  Left Transtibial Amputation      Transfers   Transfers  Sit to Stand;Stand to Sit    Sit to Stand  6: Modified independent (Device/Increase time);With upper extremity assist;With armrests;From chair/3-in-1    Stand to Sit  6: Modified independent (Device/Increase time);With upper extremity assist;With armrests;To chair/3-in-1      Ambulation/Gait   Ambulation/Gait  Yes    Ambulation/Gait Assistance  6: Modified independent (Device/Increase time)    Ambulation/Gait Assistance Details  varying assistive device from rollator for community/uneven terrains like job sites, cane for limited distances like household & handicap parking in/out buildings, and no device within room.  Increasing support with LBP & fatigue.     Ambulation Distance (Feet)  200 Feet    Assistive device  Straight cane;Prosthesis    Gait Pattern  Step-through pattern;Decreased arm swing - left;Decreased step length - right;Decreased stance time - left;Decreased  stride length;Left hip hike;Abducted - left    Ambulation Surface  Indoor;Level    Stairs  Yes    Stairs Assistance  6: Modified independent (Device/Increase time)    Stair Management Technique  One rail Left;With cane;Step to pattern;Forwards    Number of Stairs  4    Ramp  6: Modified independent (Device)   cane & prosthesis   Curb  6: Modified independent (Device/increase time)  cane & prosthesis     Berg Balance Test   Sit to Stand  Able to stand  independently using hands    Standing Unsupported  Able to stand safely 2 minutes    Sitting with Back Unsupported but Feet Supported on Floor or Stool  Able to sit safely and securely 2 minutes    Stand to Sit  Controls descent by using hands    Transfers  Able to transfer safely, definite need of hands    Standing Unsupported with Eyes Closed  Able to stand 10 seconds with supervision    Standing Ubsupported with Feet Together  Able to place feet together independently but unable to hold for 30 seconds    From Standing, Reach Forward with Outstretched Arm  Can reach forward >12 cm safely (5")    From Standing Position, Pick up Object from Floor  Able to pick up shoe, needs supervision    From Standing Position, Turn to Look Behind Over each Shoulder  Looks behind one side only/other side shows less weight shift    Turn 360 Degrees  Able to turn 360 degrees safely but slowly    Standing Unsupported, Alternately Place Feet on Step/Stool  Able to complete >2 steps/needs minimal assist    Standing Unsupported, One Foot in Front  Able to take small step independently and hold 30 seconds    Standing on One Leg  Tries to lift leg/unable to hold 3 seconds but remains standing independently    Total Score  37    Berg commentLinus Orn 9/56      Prosthetics Assessment - 04/10/18 [place] with  Skin check;Residual limb care;Care of non-amputated limb;Prosthetic cleaning;Ply sock cleaning;Correct  ply sock adjustment;Proper wear schedule/adjustment;Proper weight-bearing schedule/adjustment    Donning prosthesis   Modified independent (Device/Increase time)    Doffing prosthesis   Modified independent (Device/Increase time)    Current prosthetic wear tolerance (days/week)   daily    Current prosthetic wear tolerance (#hours/day)   most of awake hours    Residual limb condition   intact                            PT Short Term Goals - 03/18/18 1325      PT SHORT TERM GOAL #1   Title  Patient verbalizes proper skin management for residual limb with use of prosthesis. (All updated STGs Target Date: 03/15/2018)    Baseline  MET 03/11/2018    Status  Achieved      PT SHORT TERM GOAL #2   Title  Patient reports back pain increases <4 increments on 0-10 scale with gait & standing activities.     Baseline  03/18/18: met with activity in session today    Status  Achieved      PT SHORT TERM GOAL #3   Title  Patient ambulates 80' with cane & prosthesis scanning environment with supervision.     Baseline  03/18/18: pt needed up to min guard to min assist and was not able to meet the distance due to back pain.     Status  Not Met      PT SHORT TERM GOAL #4   Title  Patient negotiates ramps, curbs with cane & stairs with 1 rail/cane with prosthesis with minimal assist.     Baseline  03/18/18: met today with cane/prosthesis  Status  Achieved      PT SHORT TERM GOAL #5   Title  Patient able to stand 2 minutes without UE support safely.     Baseline  MET 03/11/2018    Status  Achieved        PT Long Term Goals - 04/10/18 1616      PT LONG TERM GOAL #1   Title  Patient verbalizes proper prosthetic care including problem solving new issues and tolerates wear >90% of awake hours without issues. (All updated LTGs Target Date: 04/12/2018)    Baseline  MET 04/10/2018    Time  2    Period  Months    Status  Achieved      PT LONG TERM GOAL #2   Title  Patient reports back  pain increases </= 2 increments on 0-10 scale with standing & gait activities and verbalizes proper use of assistive devices to maximize function without significant pain increases.     Baseline  Partially MET 04/10/2018  Patient verbalizes how to use various assistive devices to control back pain. When he pushes distance too far with cane, his back pain increases ~4 increments.     Time  2    Period  Months    Status  Partially Met      PT LONG TERM GOAL #3   Title  Berg Balance >36/56 to indicate lower fall risk & less dependency in standing ADLs.     Baseline  MET 04/10/2018  Merrilee Jansky 37/56    Time  3    Period  Months    Status  Achieved      PT LONG TERM GOAL #4   Title  Patient ambulates 200' including paved surfaces with cane & prosthesis modified indepenent.     Baseline  MET 04/10/2018    Time  2    Period  Months    Status  Achieved      PT LONG TERM GOAL #5   Title  Patient negotiates ramps, curbs & stairs 1 rail with cane & prosthesis modified independent to enable community access.     Baseline  MET 04/10/2018    Time  2    Period  Months    Status  Achieved            Plan - 04/10/18 1619    Clinical Impression Statement  Patient met or partially met long term goals. He verbalizes understanding of varying assistive devices to control low back pain and safety. Berg Balance score 37/56 is improved from 9/56 initially but still indicates high fall risk with score <45/56.  His mobility is still limited by chronic low back pain and vascular pain.     Rehab Potential  Good    Clinical Impairments Affecting Rehab Potential  weakness BUEs, BLEs & trunk, impaired balance with high fall risk, dependent gait with prosthesis, dependent in proper prosthetic care & use    PT Frequency  2x / week    PT Duration  8 weeks    PT Treatment/Interventions  ADLs/Self Care Home Management;Canalith Repostioning;DME Instruction;Gait training;Stair training;Functional mobility  training;Therapeutic activities;Therapeutic exercise;Balance training;Patient/family education;Neuromuscular re-education;Prosthetic Training;Manual techniques;Vestibular    PT Next Visit Plan  discharge    PT Home Exercise Plan  Access Code: [email or web address]     Consulted and Agree with Plan of Care  Patient       Patient will benefit from skilled therapeutic intervention in order to improve the following deficits and impairments:  Abnormal  gait, Decreased activity tolerance, Decreased balance, Decreased coordination, Decreased endurance, Decreased knowledge of use of DME, Decreased mobility, Decreased range of motion, Decreased strength, Dizziness, Impaired flexibility, Prosthetic Dependency, Postural dysfunction, Pain, Decreased skin integrity  Visit Diagnosis: Muscle weakness (generalized)  Other abnormalities of gait and mobility  Unsteadiness on feet  Abnormal posture  Dizziness and giddiness     Problem List Patient Active Problem List   Diagnosis Date Noted  . SOB (shortness of breath) 12/06/2017  . Uncontrolled type 2 diabetes mellitus with insulin therapy ([place]) 12/06/2017  . Primary osteoarthritis of left knee 11/02/2017  . Open wound 06/05/2017  . Diabetic ulcer of left lower leg ([place]) 05/16/2017  . Acquired absence of left leg below knee ([place]) 02/28/2017  . Impaired mobility and activities of daily living 02/15/2017  . Atherosclerotic heart disease of native coronary artery without angina pectoris 01/05/2017  . HTN (hypertension) 12/19/2016  . Atherosclerosis of native arteries of the extremities with ulceration ([place]) 12/19/2016  . Peripheral vascular disease ([place]) 12/07/2016  . CHF (congestive heart failure) ([place]) 11/24/2016  . Demand ischemia ([place])   . 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 Stage 3 chronic kidney disease due to diabetes mellitus ([place]) 11/23/2016  . NSTEMI (non-ST elevated myocardial infarction) ([place]) 11/23/2016  . Acute diastolic CHF  (congestive heart failure) ([place]) 11/23/2016  . Acute congestive heart failure ([place])   . Burn of left foot 11/21/2016  . Diabetic peripheral neuropathy ([place]) 11/21/2016  . Diabetic ulcer of right foot associated with type 2 diabetes mellitus, with fat layer exposed ([place]) 11/21/2016  . DDD (degenerative disc disease), lumbar 03/27/2016  . History of deep vein thrombosis (DVT) of lower extremity 03/26/2016  . History of pulmonary embolism 10/01/2015  . Nephropathy, diabetic ([place]) 10/01/2015  . 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 Chronic obstructive pulmonary disease, unspecified ([place]) 12/16/2013  . Medication management 04/14/2013  . History of recurrent DVT/E 04/11/2013  . Sleep apnea 04/11/2013  . Bradycardia 04/11/2013  . History of Elevated CK level  04/11/2013  . Class 1 obesity due to excess calories with serious comorbidity and body mass index (BMI) of 34.0 to 34.9 in adult 04/11/2013  . Peripheral neuropathy ([place])   . Charcot's arthropathy   . Chronic diastolic heart failure ([place]) 04/08/2013  . Paroxysmal atrial fibrillation ([place]) 04/07/2013  . Reactive depression (situ[place]) 05/16/2011  . Long term (current) use of anticoagulants 09/21/2010  . 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 ED (erectile dysfunction) of organic origin 07/28/2010  . Poorly controlled type 2 diabetes mellitus with peripheral neuropathy ([place]) 05/26/2008  . Hyperlipidemia 05/26/2008  . Hypertensive heart disease 05/26/2008    Jamey Reas PT, DPT 04/11/2018, 4:22 PM  [facility] [address] [place], [place], [address] Phone: [phone]   Fax:  [phone]  Name: MOSIAH BASTIN MRN: [id number] Date of Birth: 1955-11-30

## 2018-04-13 IMAGING — CR DG CHEST 2V
2 series · 2 of 2 positions shown · non-contrast
Comparison: Radiographs 09/24/2016 and 03/04/2015.

CLINICAL DATA: Left chest pain and burning today with shortness of
breath. Cough for 2 days. History of diabetes.

EXAM:
CHEST  2 VIEW

[chest lat]
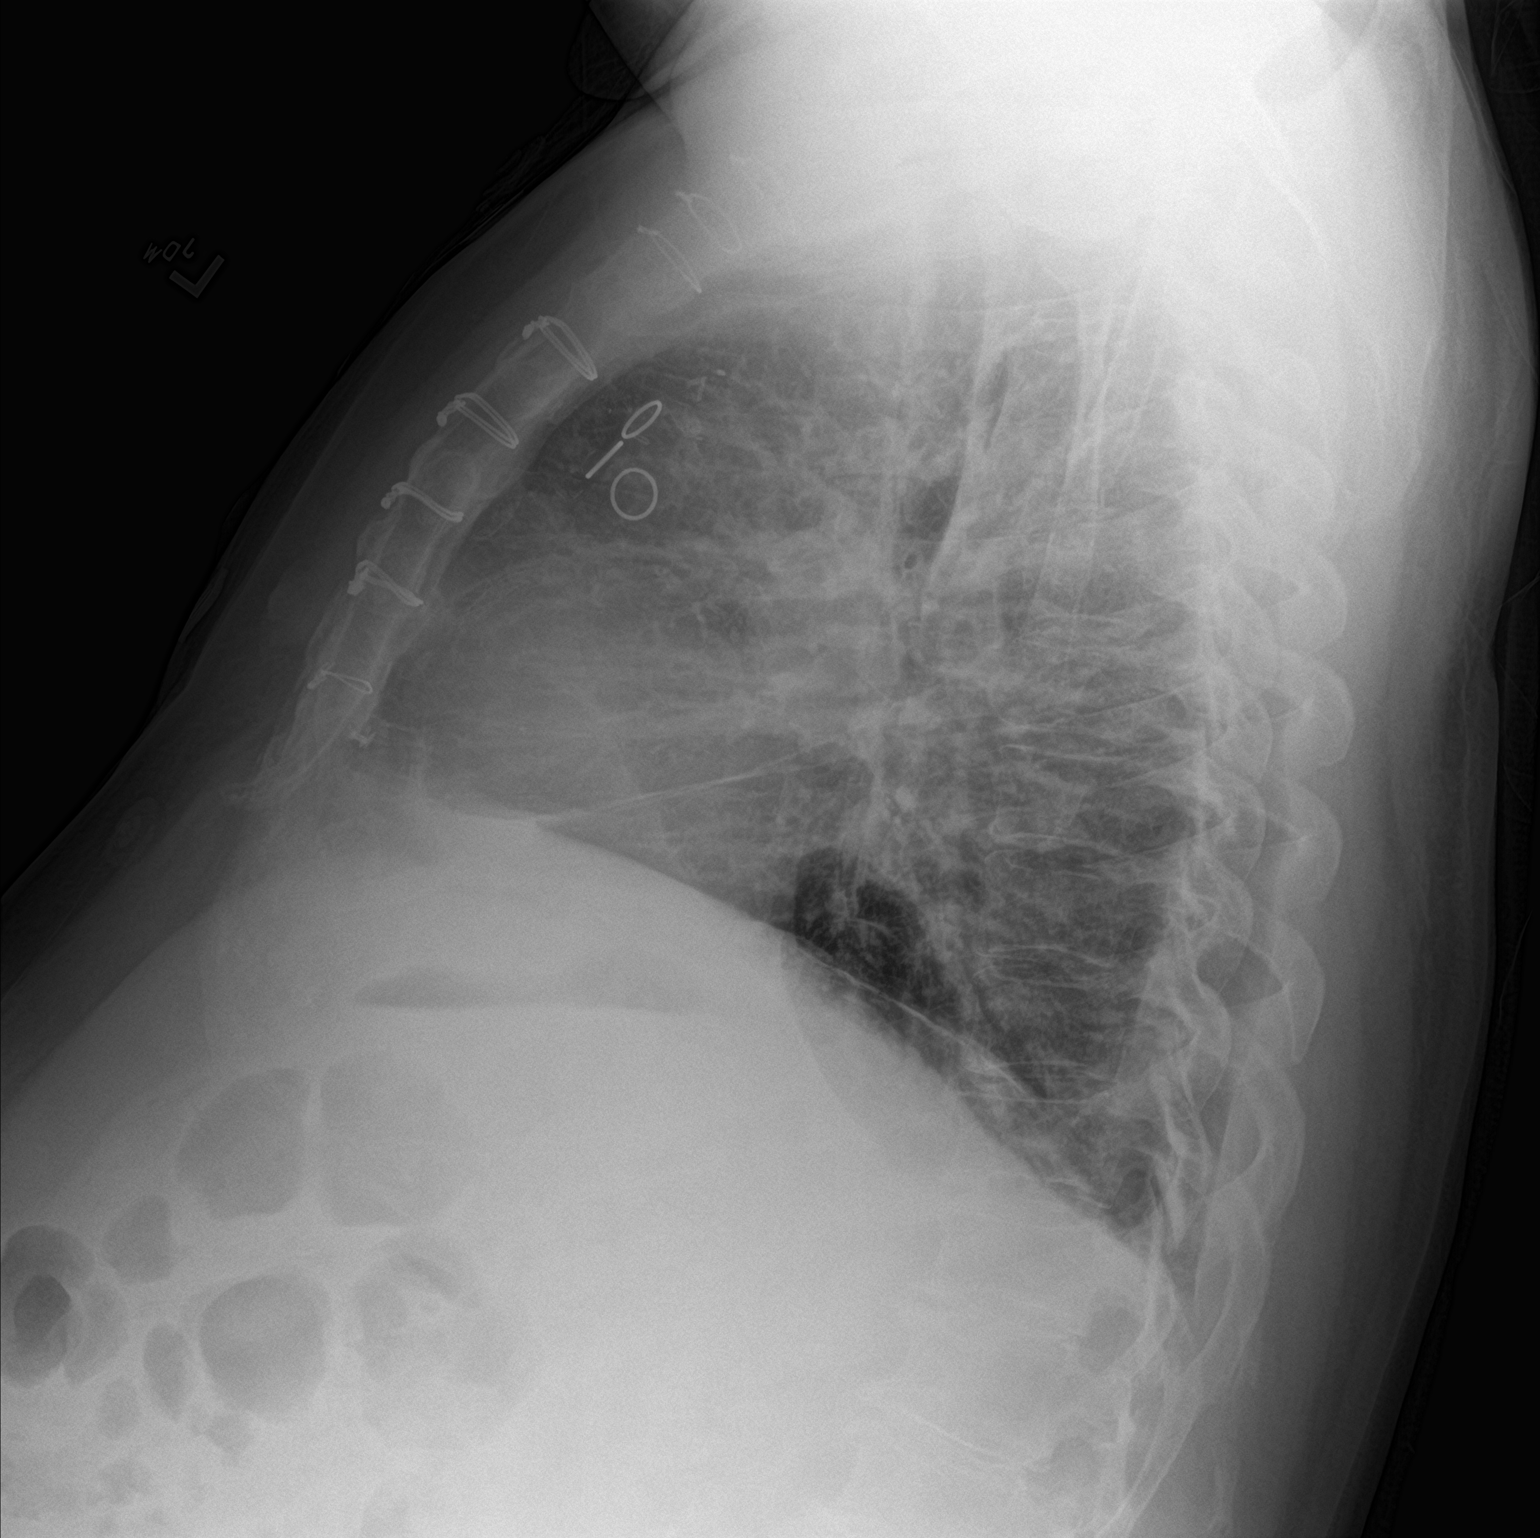

[chest pa]
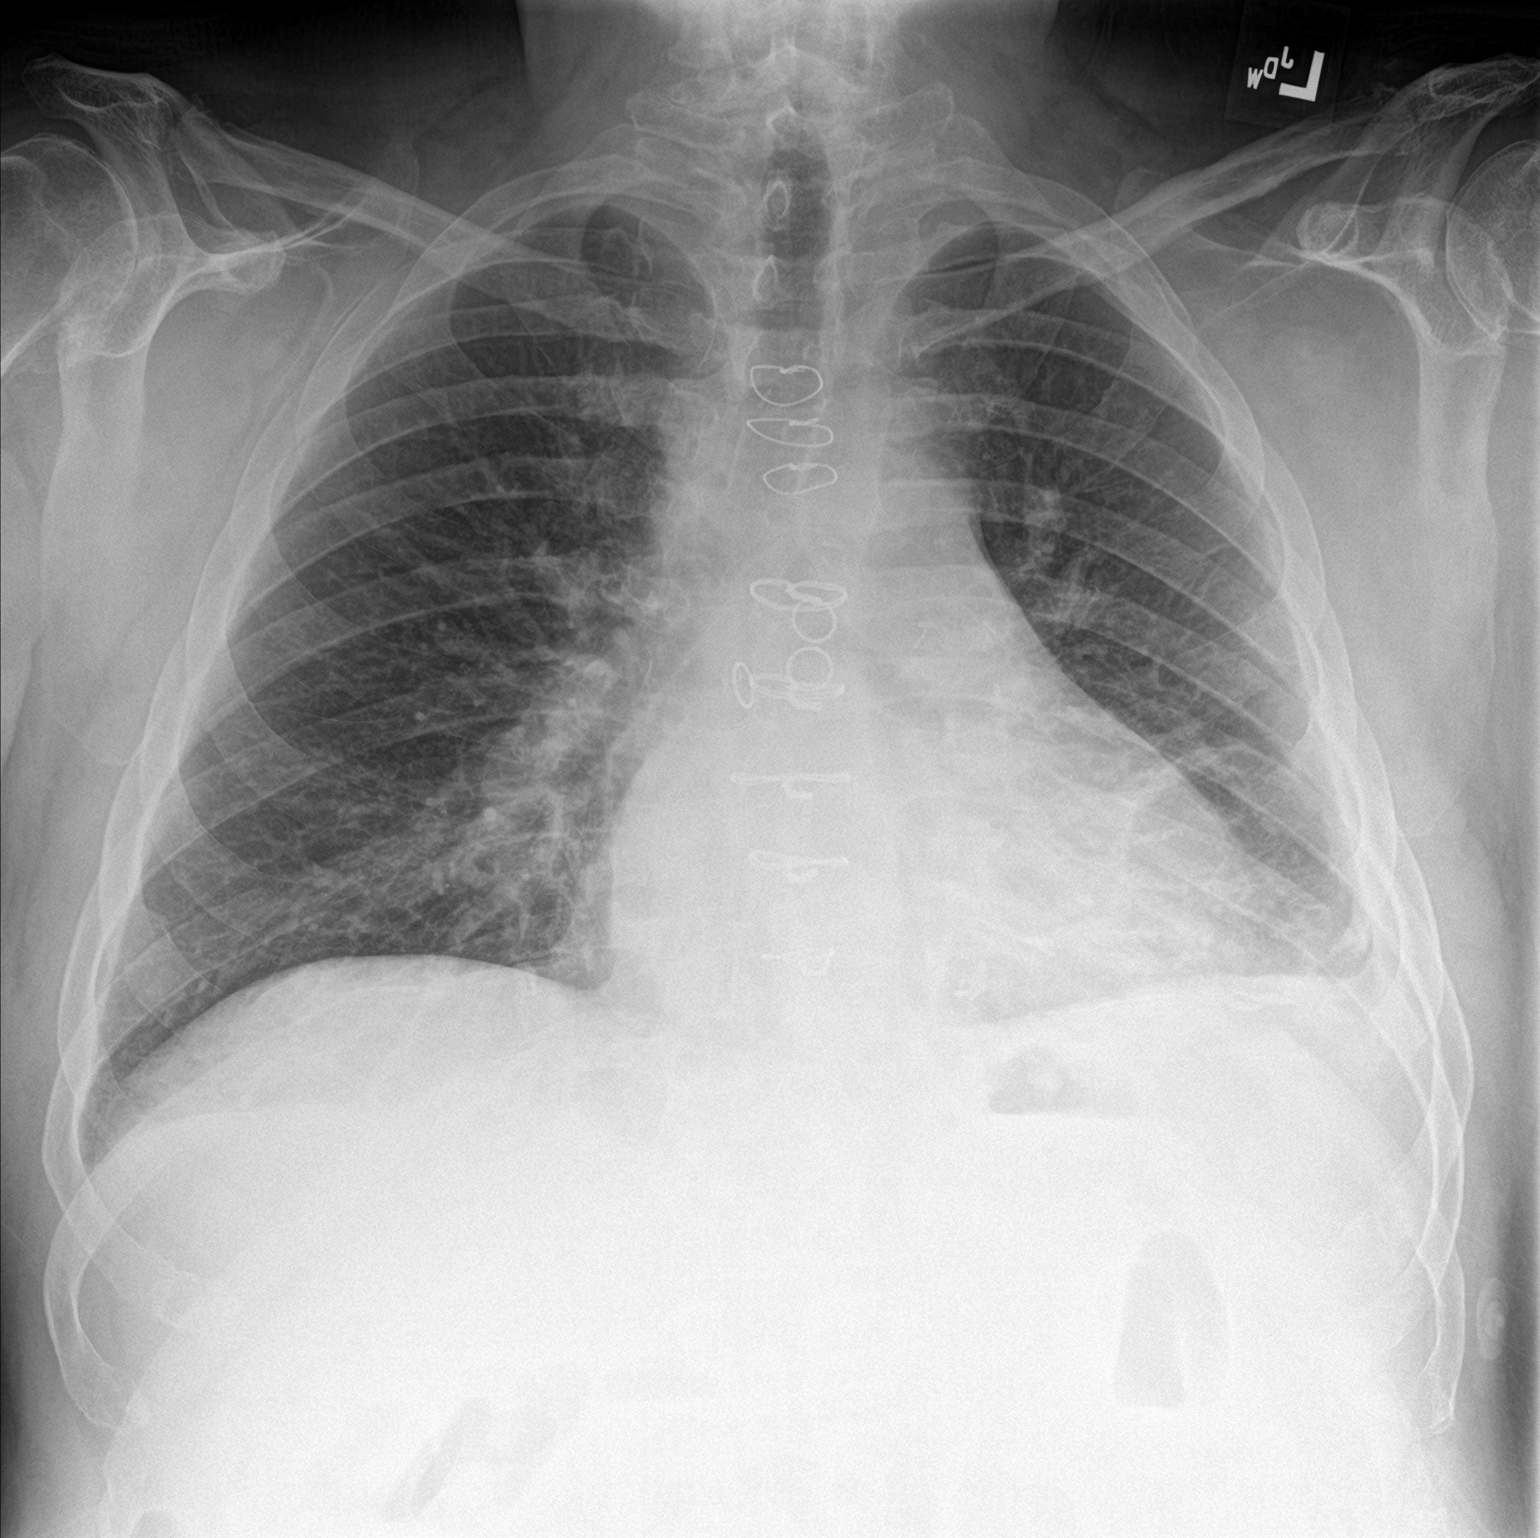

[2 of 2 positions shown; findings below may reference images not displayed]

FINDINGS: Stable mild cardiomegaly status post median sternotomy and CABG.
There is chronic pleural thickening and basilar scarring on the
left. No edema, confluent airspace opacity or pleural effusion is
demonstrated. The bones appear unchanged.
IMPRESSION: Chronic postsurgical changes in the left hemithorax. No acute
cardiopulmonary process.

## 2018-04-18 ENCOUNTER — Other Ambulatory Visit: Payer: Managed Care, Other (non HMO)

## 2018-04-18 DIAGNOSIS — E785 Hyperlipidemia, unspecified: Secondary | ICD-10-CM | POA: Diagnosis not present

## 2018-04-19 LAB — LIPID PANEL
Chol/HDL Ratio: 3.3 ratio (ref 0.0–5.0)
Cholesterol, Total: 123 mg/dL (ref 100–199)
HDL: 37 mg/dL — ABNORMAL LOW (ref >39)
LDL Calculated: 63 mg/dL (ref 0–99)
Triglycerides: 117 mg/dL (ref 0–149)
VLDL Cholesterol Cal: 23 mg/dL (ref 5–40)

## 2018-04-19 NOTE — Progress Notes (Signed)
Lab Results from 04/18/18 have been routed to the Ordering Provider -[facility] Won Kreuzer CMA

## 2018-04-22 ENCOUNTER — Telehealth: Payer: Self-pay | Admitting: Cardiology

## 2018-04-22 NOTE — Telephone Encounter (Signed)
Pt aware of his blood work  

## 2018-04-22 NOTE — Telephone Encounter (Signed)
Patient's wife returned call for lab results.  

## 2018-04-30 IMAGING — CR DG CHEST 2V
2 series · 2 of 2 positions shown · non-contrast
Comparison: 11/06/2016

CLINICAL DATA: Cough and fever for 3 weeks question pneumonia,
history hypertension, CHF, diabetes mellitus, COPD, prior pulmonary
emboli and DVT, atrial fibrillation

EXAM:
CHEST  2 VIEW

[chest lat]
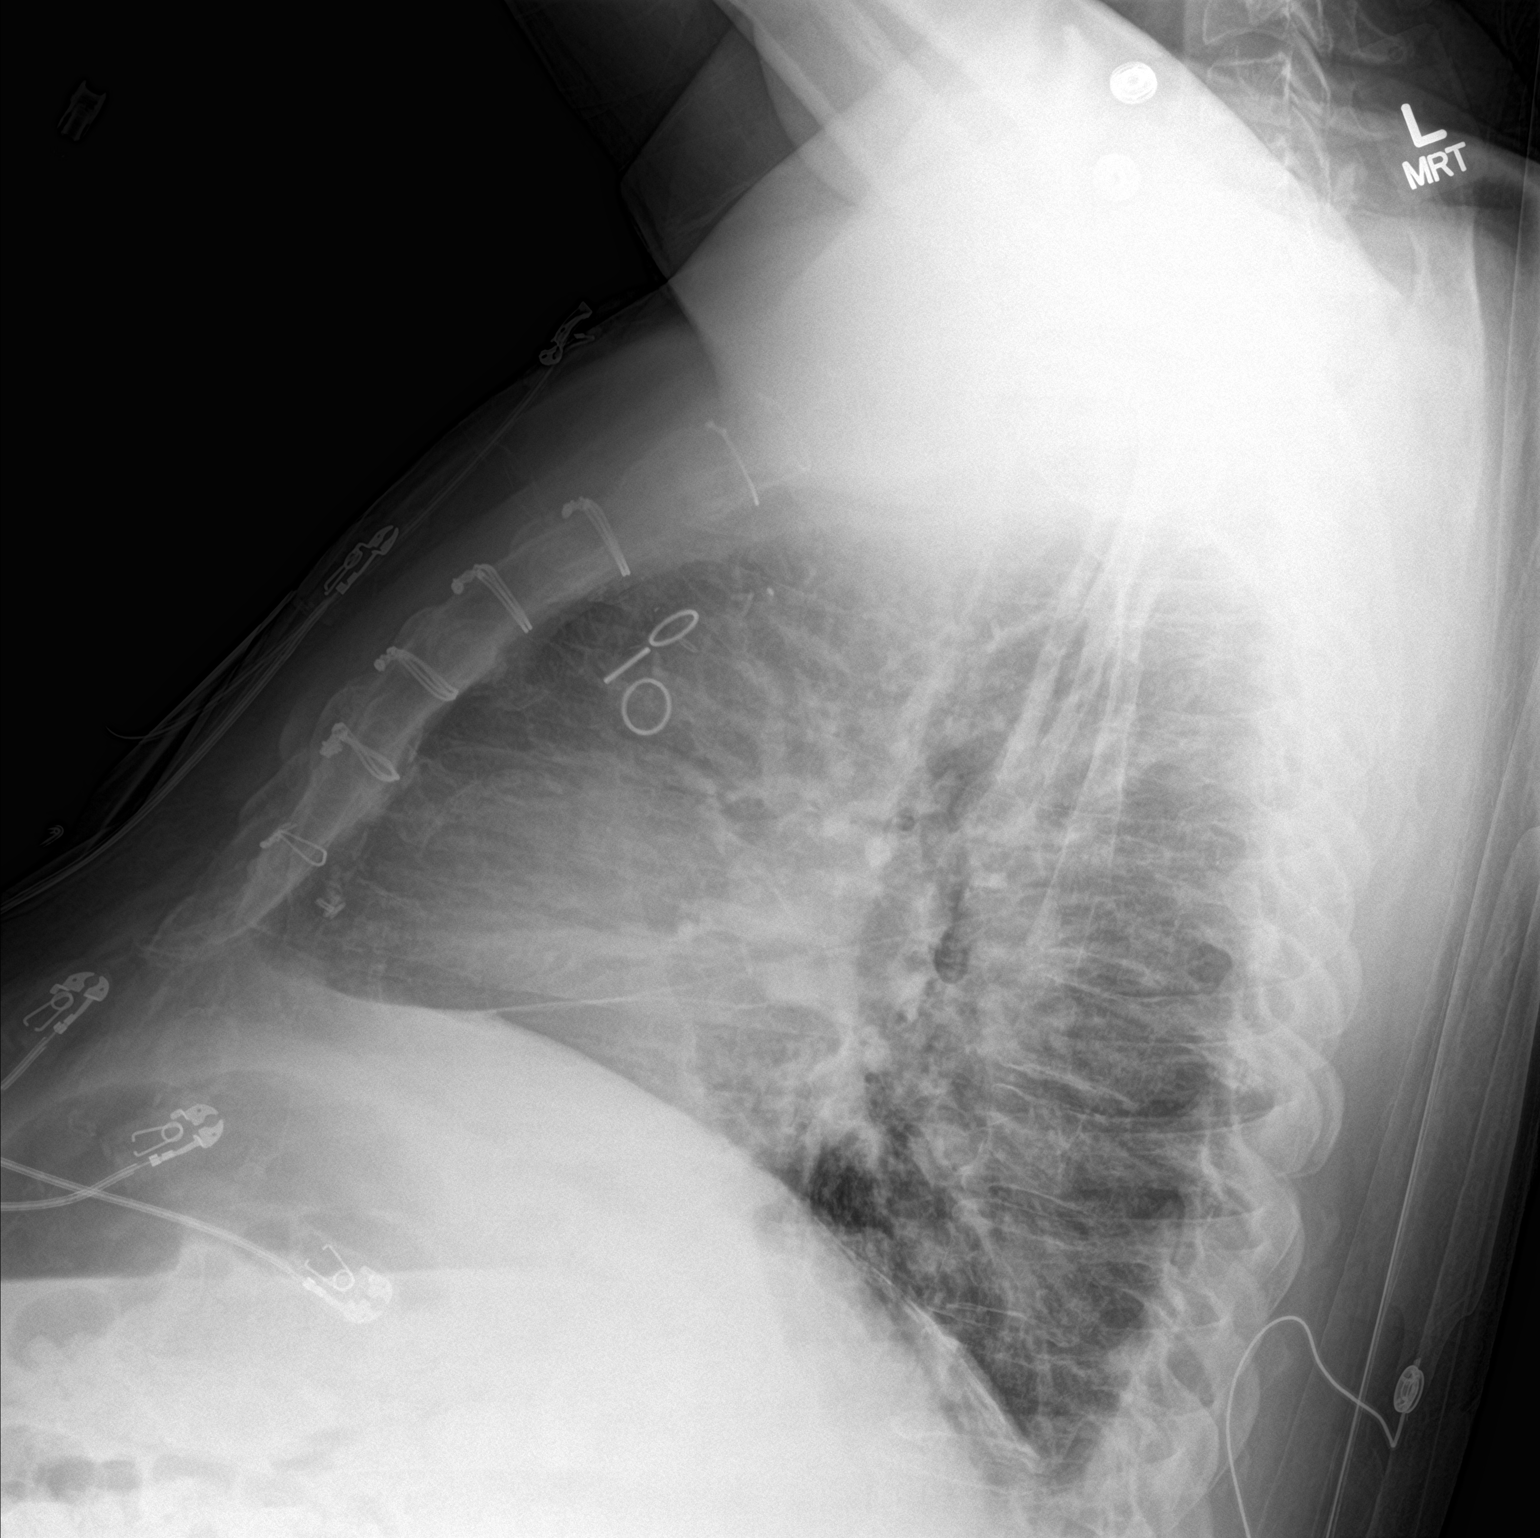

[chest ap]
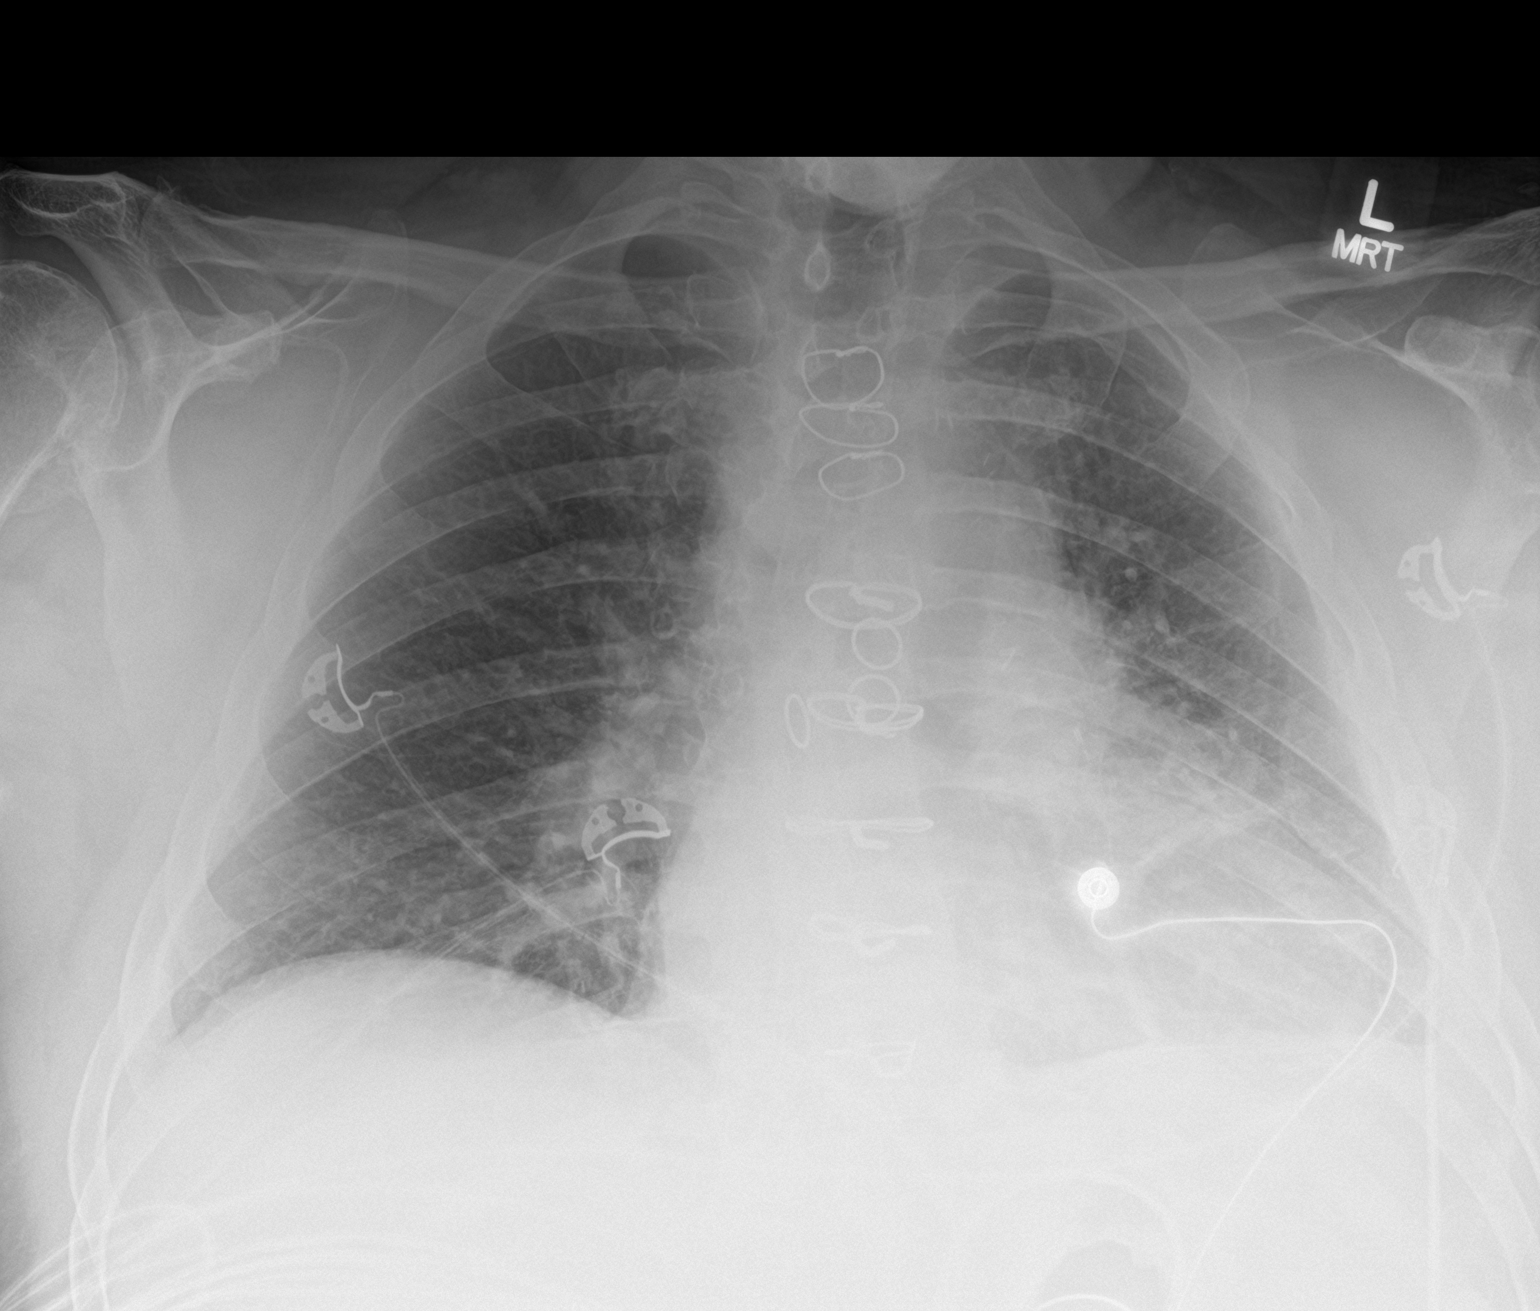

[2 of 2 positions shown; findings below may reference images not displayed]

FINDINGS: Enlargement of cardiac silhouette post CABG.

Mediastinal contours and pulmonary vascularity normal.

Scarring at lingula.

Central peribronchial thickening.

No infiltrate, pleural effusion or pneumothorax.

Bones demineralized.
IMPRESSION: Enlargement of cardiac silhouette post CABG.

Bronchitic changes with lingular scarring.

No acute infiltrate.

## 2018-05-02 ENCOUNTER — Other Ambulatory Visit: Payer: Self-pay | Admitting: Cardiology

## 2018-05-13 ENCOUNTER — Telehealth: Payer: Self-pay

## 2018-05-13 NOTE — Telephone Encounter (Signed)
Left message on pt's vm that I am offering drive thru INR checks at the [facility] this Wednesday, offered him 9:30, 9:45 or 10:30. Asked him to call back if he can come by at one of these times.

## 2018-05-21 ENCOUNTER — Other Ambulatory Visit: Payer: Self-pay | Admitting: Cardiology

## 2018-05-21 NOTE — Telephone Encounter (Signed)
Overdue for INR check. [place] RN waiting for call back to schedule patient.

## 2018-06-06 LAB — POCT INR: INR: 2.7 (ref 2.0–3.0)

## 2018-06-06 NOTE — Addendum Note (Signed)
Addended by: Judie Petit on: 06/06/2018 10:03 AM   Modules accepted: Level of Service

## 2018-06-07 ENCOUNTER — Other Ambulatory Visit: Payer: Self-pay | Admitting: Nurse Practitioner

## 2018-06-07 ENCOUNTER — Ambulatory Visit (INDEPENDENT_AMBULATORY_CARE_PROVIDER_SITE_OTHER): Payer: Managed Care, Other (non HMO)

## 2018-06-07 ENCOUNTER — Telehealth: Payer: Self-pay

## 2018-06-07 DIAGNOSIS — Z86711 Personal history of pulmonary embolism: Secondary | ICD-10-CM | POA: Diagnosis not present

## 2018-06-07 DIAGNOSIS — Z86718 Personal history of other venous thrombosis and embolism: Secondary | ICD-10-CM

## 2018-06-07 DIAGNOSIS — I480 Paroxysmal atrial fibrillation: Secondary | ICD-10-CM

## 2018-06-07 DIAGNOSIS — Z7901 Long term (current) use of anticoagulants: Secondary | ICD-10-CM

## 2018-06-07 NOTE — Telephone Encounter (Signed)
Called pt to schedule overdue INR check. He reports that he saw his PCP yesterday and he had them draw PT/INR since he knew he was overdue.  Please see anticoag note for results.

## 2018-06-07 NOTE — Patient Instructions (Signed)
Continue 2 tablets daily except 1 TABLET ON MONDAYS & FRIDAYS. Recheck in 6 weeks.

## 2018-06-10 ENCOUNTER — Other Ambulatory Visit: Payer: Self-pay

## 2018-06-10 MED ORDER — WARFARIN SODIUM 5 MG PO TABS: 5.0000 mg | ORAL_TABLET | ORAL | 0 refills | Status: DC

## 2018-06-17 ENCOUNTER — Emergency Department (HOSPITAL_COMMUNITY): Payer: Managed Care, Other (non HMO)

## 2018-06-17 ENCOUNTER — Telehealth: Payer: Self-pay | Admitting: Cardiology

## 2018-06-17 ENCOUNTER — Encounter (HOSPITAL_COMMUNITY): Payer: Self-pay | Admitting: Emergency Medicine

## 2018-06-17 ENCOUNTER — Encounter (HOSPITAL_COMMUNITY): Payer: Managed Care, Other (non HMO)

## 2018-06-17 ENCOUNTER — Other Ambulatory Visit: Payer: Self-pay

## 2018-06-17 ENCOUNTER — Inpatient Hospital Stay (HOSPITAL_COMMUNITY)
Admission: EM | Admit: 2018-06-17 | Discharge: 2018-06-20 | DRG: 308 | Disposition: A | Discharge status: Home / Self Care | Payer: Managed Care, Other (non HMO) | Attending: Internal Medicine | Admitting: Internal Medicine

## 2018-06-17 DIAGNOSIS — R001 Bradycardia, unspecified: Secondary | ICD-10-CM | POA: Diagnosis present

## 2018-06-17 DIAGNOSIS — I4892 Unspecified atrial flutter: Secondary | ICD-10-CM

## 2018-06-17 DIAGNOSIS — G4733 Obstructive sleep apnea (adult) (pediatric): Secondary | ICD-10-CM | POA: Diagnosis present

## 2018-06-17 DIAGNOSIS — Z89512 Acquired absence of left leg below knee: Secondary | ICD-10-CM

## 2018-06-17 DIAGNOSIS — Z86711 Personal history of pulmonary embolism: Secondary | ICD-10-CM

## 2018-06-17 DIAGNOSIS — E1122 Type 2 diabetes mellitus with diabetic chronic kidney disease: Secondary | ICD-10-CM | POA: Diagnosis present

## 2018-06-17 DIAGNOSIS — I2510 Atherosclerotic heart disease of native coronary artery without angina pectoris: Secondary | ICD-10-CM | POA: Diagnosis present

## 2018-06-17 DIAGNOSIS — Z20828 Contact with and (suspected) exposure to other viral communicable diseases: Secondary | ICD-10-CM

## 2018-06-17 DIAGNOSIS — I252 Old myocardial infarction: Secondary | ICD-10-CM | POA: Diagnosis not present

## 2018-06-17 DIAGNOSIS — R590 Localized enlarged lymph nodes: Secondary | ICD-10-CM | POA: Diagnosis not present

## 2018-06-17 DIAGNOSIS — I480 Paroxysmal atrial fibrillation: Principal | ICD-10-CM | POA: Diagnosis present

## 2018-06-17 DIAGNOSIS — E1140 Type 2 diabetes mellitus with diabetic neuropathy, unspecified: Secondary | ICD-10-CM | POA: Diagnosis present

## 2018-06-17 DIAGNOSIS — I509 Heart failure, unspecified: Secondary | ICD-10-CM | POA: Diagnosis present

## 2018-06-17 DIAGNOSIS — Z6835 Body mass index (BMI) 35.0-35.9, adult: Secondary | ICD-10-CM

## 2018-06-17 DIAGNOSIS — I4430 Unspecified atrioventricular block: Secondary | ICD-10-CM | POA: Diagnosis not present

## 2018-06-17 DIAGNOSIS — L03115 Cellulitis of right lower limb: Secondary | ICD-10-CM | POA: Diagnosis present

## 2018-06-17 DIAGNOSIS — Z951 Presence of aortocoronary bypass graft: Secondary | ICD-10-CM | POA: Diagnosis not present

## 2018-06-17 DIAGNOSIS — Z86718 Personal history of other venous thrombosis and embolism: Secondary | ICD-10-CM | POA: Diagnosis not present

## 2018-06-17 DIAGNOSIS — G4730 Sleep apnea, unspecified: Secondary | ICD-10-CM | POA: Diagnosis present

## 2018-06-17 DIAGNOSIS — Z8249 Family history of ischemic heart disease and other diseases of the circulatory system: Secondary | ICD-10-CM | POA: Diagnosis not present

## 2018-06-17 DIAGNOSIS — E1165 Type 2 diabetes mellitus with hyperglycemia: Secondary | ICD-10-CM | POA: Diagnosis not present

## 2018-06-17 DIAGNOSIS — N179 Acute kidney failure, unspecified: Secondary | ICD-10-CM | POA: Diagnosis present

## 2018-06-17 DIAGNOSIS — Z79899 Other long term (current) drug therapy: Secondary | ICD-10-CM

## 2018-06-17 DIAGNOSIS — I739 Peripheral vascular disease, unspecified: Secondary | ICD-10-CM | POA: Diagnosis not present

## 2018-06-17 DIAGNOSIS — I484 Atypical atrial flutter: Secondary | ICD-10-CM | POA: Diagnosis present

## 2018-06-17 DIAGNOSIS — I50813 Acute on chronic right heart failure: Secondary | ICD-10-CM | POA: Diagnosis not present

## 2018-06-17 DIAGNOSIS — I441 Atrioventricular block, second degree: Secondary | ICD-10-CM | POA: Diagnosis not present

## 2018-06-17 DIAGNOSIS — Z7901 Long term (current) use of anticoagulants: Secondary | ICD-10-CM

## 2018-06-17 DIAGNOSIS — R0602 Shortness of breath: Secondary | ICD-10-CM

## 2018-06-17 DIAGNOSIS — Z7189 Other specified counseling: Secondary | ICD-10-CM

## 2018-06-17 DIAGNOSIS — I110 Hypertensive heart disease with heart failure: Secondary | ICD-10-CM | POA: Diagnosis present

## 2018-06-17 DIAGNOSIS — N183 Chronic kidney disease, stage 3 (moderate): Secondary | ICD-10-CM | POA: Diagnosis present

## 2018-06-17 DIAGNOSIS — E118 Type 2 diabetes mellitus with unspecified complications: Secondary | ICD-10-CM | POA: Diagnosis not present

## 2018-06-17 DIAGNOSIS — I5033 Acute on chronic diastolic (congestive) heart failure: Secondary | ICD-10-CM | POA: Diagnosis present

## 2018-06-17 DIAGNOSIS — Z955 Presence of coronary angioplasty implant and graft: Secondary | ICD-10-CM | POA: Diagnosis not present

## 2018-06-17 DIAGNOSIS — J449 Chronic obstructive pulmonary disease, unspecified: Secondary | ICD-10-CM | POA: Diagnosis present

## 2018-06-17 DIAGNOSIS — R599 Enlarged lymph nodes, unspecified: Secondary | ICD-10-CM | POA: Diagnosis present

## 2018-06-17 DIAGNOSIS — Z794 Long term (current) use of insulin: Secondary | ICD-10-CM

## 2018-06-17 DIAGNOSIS — Z888 Allergy status to other drugs, medicaments and biological substances status: Secondary | ICD-10-CM

## 2018-06-17 DIAGNOSIS — I872 Venous insufficiency (chronic) (peripheral): Secondary | ICD-10-CM | POA: Diagnosis present

## 2018-06-17 DIAGNOSIS — K219 Gastro-esophageal reflux disease without esophagitis: Secondary | ICD-10-CM | POA: Diagnosis present

## 2018-06-17 DIAGNOSIS — E669 Obesity, unspecified: Secondary | ICD-10-CM | POA: Diagnosis present

## 2018-06-17 DIAGNOSIS — T502X5A Adverse effect of carbonic-anhydrase inhibitors, benzothiadiazides and other diuretics, initial encounter: Secondary | ICD-10-CM | POA: Diagnosis not present

## 2018-06-17 DIAGNOSIS — Z9119 Patient's noncompliance with other medical treatment and regimen: Secondary | ICD-10-CM

## 2018-06-17 DIAGNOSIS — I361 Nonrheumatic tricuspid (valve) insufficiency: Secondary | ICD-10-CM | POA: Diagnosis not present

## 2018-06-17 DIAGNOSIS — E785 Hyperlipidemia, unspecified: Secondary | ICD-10-CM | POA: Diagnosis present

## 2018-06-17 DIAGNOSIS — J9610 Chronic respiratory failure, unspecified whether with hypoxia or hypercapnia: Secondary | ICD-10-CM | POA: Diagnosis present

## 2018-06-17 DIAGNOSIS — I10 Essential (primary) hypertension: Secondary | ICD-10-CM | POA: Diagnosis not present

## 2018-06-17 DIAGNOSIS — R238 Other skin changes: Secondary | ICD-10-CM

## 2018-06-17 LAB — CBC
HCT: 45.8 % (ref 39.0–52.0)
Hemoglobin: 15 g/dL (ref 13.0–17.0)
MCH: 28.4 pg (ref 26.0–34.0)
MCHC: 32.8 g/dL (ref 30.0–36.0)
MCV: 86.7 fL (ref 80.0–100.0)
Platelets: 196 10*3/uL (ref 150–400)
RBC: 5.28 MIL/uL (ref 4.22–5.81)
RDW: 14.5 % (ref 11.5–15.5)
WBC: 9.9 10*3/uL (ref 4.0–10.5)
nRBC: 0 % (ref 0.0–0.2)

## 2018-06-17 LAB — TROPONIN I
Troponin I: <0.03 ng/mL (ref <0.03)
Troponin I: <0.03 ng/mL (ref <0.03)

## 2018-06-17 LAB — BASIC METABOLIC PANEL
Anion gap: 11 (ref 5–15)
BUN: 20 mg/dL (ref 8–23)
CO2: 27 mmol/L (ref 22–32)
Calcium: 9.2 mg/dL (ref 8.9–10.3)
Chloride: 99 mmol/L (ref 98–111)
Creatinine, Ser: 1.28 mg/dL — ABNORMAL HIGH (ref 0.61–1.24)
GFR calc Af Amer: >60 mL/min (ref >60)
GFR calc non Af Amer: 59 mL/min — ABNORMAL LOW (ref >60)
Glucose, Bld: 279 mg/dL — ABNORMAL HIGH (ref 70–99)
Potassium: 4.4 mmol/L (ref 3.5–5.1)
Sodium: 137 mmol/L (ref 135–145)

## 2018-06-17 LAB — HEMOGLOBIN A1C
Hgb A1c MFr Bld: 10.1 % — ABNORMAL HIGH (ref 4.8–5.6)
Mean Plasma Glucose: 243.17 mg/dL

## 2018-06-17 LAB — TSH: TSH: 3.144 u[IU]/mL (ref 0.350–4.500)

## 2018-06-17 LAB — PROTIME-INR
INR: 2.4 — ABNORMAL HIGH (ref 0.8–1.2)
Prothrombin Time: 26 seconds — ABNORMAL HIGH (ref 11.4–15.2)

## 2018-06-17 LAB — SARS CORONAVIRUS 2 BY RT PCR (HOSPITAL ORDER, PERFORMED IN ~~LOC~~ HOSPITAL LAB): SARS Coronavirus 2: NEGATIVE

## 2018-06-17 LAB — GLUCOSE, CAPILLARY: Glucose-Capillary: 224 mg/dL — ABNORMAL HIGH (ref 70–99)

## 2018-06-17 LAB — BRAIN NATRIURETIC PEPTIDE: B Natriuretic Peptide: 307.1 pg/mL — ABNORMAL HIGH (ref 0.0–100.0)

## 2018-06-17 MED ORDER — ACETAMINOPHEN 650 MG RE SUPP: 650.0000 mg | Freq: Four times a day (QID) | RECTAL | Status: DC | PRN

## 2018-06-17 MED ORDER — ATORVASTATIN CALCIUM 10 MG PO TABS
20.0000 mg | ORAL_TABLET | Freq: Every day | ORAL | Status: DC
  Administered 2018-06-18 – 2018-06-20 (×3): 20 mg via ORAL
  Filled 2018-06-17 (×3): qty 2

## 2018-06-17 MED ORDER — CEFAZOLIN SODIUM-DEXTROSE 2-4 GM/100ML-% IV SOLN
2.0000 g | Freq: Three times a day (TID) | INTRAVENOUS | Status: DC
  Administered 2018-06-17: 2 g via INTRAVENOUS
  Filled 2018-06-17 (×3): qty 100

## 2018-06-17 MED ORDER — POLYETHYLENE GLYCOL 3350 17 G PO PACK: 17.0000 g | PACK | Freq: Every day | ORAL | Status: DC | PRN

## 2018-06-17 MED ORDER — WARFARIN - PHARMACIST DOSING INPATIENT
Freq: Every day | Status: DC
  Administered 2018-06-18: 17:00:00

## 2018-06-17 MED ORDER — SODIUM CHLORIDE 0.9% FLUSH: 3.0000 mL | Freq: Once | INTRAVENOUS | Status: DC

## 2018-06-17 MED ORDER — IOHEXOL 350 MG/ML SOLN
100.0000 mL | Freq: Once | INTRAVENOUS | Status: AC | PRN
  Administered 2018-06-17: 100 mL via INTRAVENOUS

## 2018-06-17 MED ORDER — WARFARIN SODIUM 5 MG PO TABS: 5.0000 mg | ORAL_TABLET | ORAL | Status: DC

## 2018-06-17 MED ORDER — INSULIN ASPART 100 UNIT/ML ~~LOC~~ SOLN
5.0000 [IU] | Freq: Three times a day (TID) | SUBCUTANEOUS | Status: DC
  Administered 2018-06-18 – 2018-06-20 (×6): 5 [IU] via SUBCUTANEOUS

## 2018-06-17 MED ORDER — INSULIN ASPART 100 UNIT/ML ~~LOC~~ SOLN
0.0000 [IU] | Freq: Three times a day (TID) | SUBCUTANEOUS | Status: DC
  Administered 2018-06-18 (×2): 7 [IU] via SUBCUTANEOUS
  Administered 2018-06-18: 4 [IU] via SUBCUTANEOUS
  Administered 2018-06-19 – 2018-06-20 (×2): 7 [IU] via SUBCUTANEOUS
  Administered 2018-06-20: 11 [IU] via SUBCUTANEOUS

## 2018-06-17 MED ORDER — INSULIN ASPART 100 UNIT/ML ~~LOC~~ SOLN
0.0000 [IU] | Freq: Every day | SUBCUTANEOUS | Status: DC
  Administered 2018-06-17: 2 [IU] via SUBCUTANEOUS

## 2018-06-17 MED ORDER — PANTOPRAZOLE SODIUM 40 MG PO TBEC: 40.0000 mg | DELAYED_RELEASE_TABLET | Freq: Every day | ORAL | Status: DC

## 2018-06-17 MED ORDER — WARFARIN SODIUM 10 MG PO TABS
10.0000 mg | ORAL_TABLET | Freq: Once | ORAL | Status: AC
  Administered 2018-06-17: 10 mg via ORAL
  Filled 2018-06-17: qty 1

## 2018-06-17 MED ORDER — GABAPENTIN 300 MG PO CAPS: 600.0000 mg | ORAL_CAPSULE | Freq: Every evening | ORAL | Status: DC | PRN

## 2018-06-17 MED ORDER — ACETAMINOPHEN 325 MG PO TABS: 650.0000 mg | ORAL_TABLET | Freq: Four times a day (QID) | ORAL | Status: DC | PRN

## 2018-06-17 MED ORDER — FUROSEMIDE 10 MG/ML IJ SOLN
80.0000 mg | Freq: Two times a day (BID) | INTRAMUSCULAR | Status: DC
  Administered 2018-06-17 – 2018-06-19 (×5): 80 mg via INTRAVENOUS
  Filled 2018-06-17 (×5): qty 8

## 2018-06-17 NOTE — Telephone Encounter (Signed)
Lm to call back ./cy 

## 2018-06-17 NOTE — Progress Notes (Signed)
Pt refused CPAP, stated he can't breathe good with it over her nose.

## 2018-06-17 NOTE — Progress Notes (Signed)
ANTICOAGULATION CONSULT NOTE - Initial Consult  Pharmacy Consult for warfarin Indication: recurrent PE/DVT, PAF  Allergies  Allergen Reactions  . Simvastatin Diarrhea and Other (See Comments)    Fatigue, diarrhea      Patient Measurements: Height: 6' (182.9 cm) Weight: 268 lb 1.3 oz (121.6 kg) IBW/kg (Calculated) : 77.6  Vital Signs: Temp: 97.9 F (36.6 C) (05/04 1843) Temp Source: Oral (05/04 1843) BP: 150/81 (05/04 1843) Pulse Rate: 44 (05/04 1843)  Labs: Recent Labs    06/17/18 1202 06/17/18 1327  HGB 15.0  --   HCT 45.8  --   PLT 196  --   LABPROT  --  26.0*  INR  --  2.4*  CREATININE 1.28*  --   TROPONINI  --  <0.03    Estimated Creatinine Clearance: 79.5 mL/min (A) (by C-G formula based on SCr of 1.28 mg/dL (H)).   Medical History: Past Medical History:  Diagnosis Date  . Anginal pain ([place])    last pm  . Atrial fibrillation ([place])    a. Transient during 03/2013 admission.  . Bradycardia    a. Bradycardia/pauses/possible Mobitz II during 03/2013 adm. Not on BB due to this.  Marland Kitchen CAD (coronary artery disease)    a. s/p CABG 2002. b. Hx Cypher stent to the RCA. c. Inf-lat STEMI 03/2013:  LHC (04/05/13):  mLAD occluded, pD1 90, apical br of Dx occluded, CFX occluded, pOM1 90-95, RCA stents patent, diff RCA 30, S-Dx occluded, S-PDA occluded, S-OM1 40-50, L-LAD patent, EF 40% with inf HK.  PCI:  Promus (2.5 x 28) DES to mid to dist CFX.  Marland Kitchen Charcot's joint of knee   . COPD (chronic obstructive pulmonary disease) ([place])   . Deep venous thrombosis (HCC)    right lower extremity  . Diabetes mellitus    a. A1C 10.7 in 03/2013.  . Diastolic CHF ([place])    a. EF 40% by cath, 55-60% during 03/2013 adm, required IV diuresis.  . DVT, lower extremity, recurrent ([place])    a. Hx recurrent DVT per record.  . Dyslipidemia   . Elevated CK    a. Pt has refused rheum workup in the past.  . GERD (gastroesophageal reflux disease)   . History of hiatal hernia   . HTN (hypertension)     x 15 years  . Hx of cardiovascular stress test    a. Lexiscan Myoview (03/2010):  diaph atten vs inf scar, no ischemia, EF 47%; Low Risk.  Marland Kitchen Hx of echocardiogram    a. Echo (04/08/13):  Mild LVH, EF 55-60%, restrictive physiology, severe LAE, mild reduced RVSF, mild RAE.  . Leg pain    ABI 6/16:  R 1.2, L 1.1 - normal  . Myocardial infarction ([place])   . Obesity   . Peripheral neuropathy   . Pulmonary embolism ([place])   . Sleep apnea     Medications:  Medications Prior to Admission  Medication Sig Dispense Refill Last Dose  . acetaminophen (TYLENOL) 500 MG tablet Take 1,000 mg every 8 (eight) hours as needed by mouth for mild pain or headache.    Taking  . atorvastatin (LIPITOR) 20 MG tablet Take 1 tablet (20 mg total) by mouth daily. 90 tablet 3   . azelastine (ASTELIN) 0.1 % nasal spray Place into the nose.   Taking  . Blood Glucose Monitoring Suppl (FIFTY50 GLUCOSE METER 2.0) w/Device KIT Use as directed.   Taking  . furosemide (LASIX) 80 MG tablet Take 1 tablet (80 mg total) by  mouth daily. 90 tablet 3   . gabapentin (NEURONTIN) 300 MG capsule Take 600 mg daily as needed by mouth (pain in foot and ankle).    Taking  . insulin lispro (HUMALOG KWIKPEN) 100 UNIT/ML injection Inject 22-25 Units 3 (three) times daily before meals into the skin. Per sliding scale   Taking  . Insulin Syringe-Needle U-100 31G X 5/16" 0.3 ML MISC use as directed   Taking  . isosorbide mononitrate (IMDUR) 30 MG 24 hr tablet Take 1 tablet (30 mg total) by mouth daily. 90 tablet 3   . losartan (COZAAR) 25 MG tablet Take 1 tablet (25 mg total) by mouth daily. 90 tablet 3   . metoprolol succinate (TOPROL-XL) 25 MG 24 hr tablet Take 0.5 tablets (12.5 mg total) by mouth daily. 90 tablet 3   . nitroGLYCERIN (NITROSTAT) 0.4 MG SL tablet Place 1 tablet (0.4 mg total) under the tongue every 5 (five) minutes as needed for chest pain. NEEDS APPOINTMENT 25 tablet 6 Taking  . pantoprazole (PROTONIX) 40 MG tablet TAKE 1 TABLET  (40 MG TOTAL) BY MOUTH AS NEEDED (INDIGESTION). 90 tablet 1   . TRULICITY 7.84 XQ/8.2KS SOPN INJECT 0.75 MG SUBCUTANEOUSLY EVERY 7 (SEVEN) DAYS.  8 Taking  . warfarin (COUMADIN) 5 MG tablet Take 1 tablet (5 mg total) by mouth as directed. Take as directed by the coumadin clinic. 50 tablet 0     Assessment: 63 y/o male admitted with increased SOB and R leg swelling. He takes warfarin PTA for PAF and hx VTE. INR is therapeutic at 2.4. No bleeding noted, CBC is normal. CTA chest neg for PE, RLE duplex neg for DVT.  PTA: 10 mg daily except 5 mg Mon/Fri per OP clinic visit 4/24, last taken 5/3  Goal of Therapy:  INR 2-3 Monitor platelets by anticoagulation protocol: Yes   Plan:  Warfarin 10 mg PO tonight Daily INR Monitor for s/sx of bleeding   Renold Genta, PharmD, BCPS Clinical Pharmacist Clinical phone for 06/17/2018 until 10p is [phone] 06/17/2018 8:21 PM  **Pharmacist phone directory can now be found on [email or web address] listed under [place]**

## 2018-06-17 NOTE — H&P (Signed)
Date: 06/17/2018               Patient Name:  Bobby Lindsey MRN: [id number]  DOB: February 23, 1955 Age / Sex: 63 y.o., male   PCP: Adin Hector, MD         Medical Service: Internal Medicine Teaching Service         Attending Physician: Dr. Bartholomew Crews, MD    First Contact: Dr. Sharon Seller Pager: [phone]  Second Contact: Dr. Frederico Hamman Pager: [phone]       After Hours (After 5p/  First Contact Pager: [phone]  weekends / holidays): Second Contact Pager: [phone]   Chief Complaint: Shortness of breath and LLE lesion   History of Present Illness:  Bobby Lindsey. 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 He states the dyspnea has come on slowly. He normally is able to lay propped up but has been having to sleep sitting up straight and is having a lot of trouble sleeping. He has mild cough with some phlegm. Last night he had three episodes of chest pain while sitting that lasted for only a few seconds. He denies palpitations or radiating chest pain. He denies nausea, vomiting, or changes in BM. He feels that his RLE has been more swollen for the past several days and increased his lasix, normally 80 mg qd po to twice a day. He has noticed increased urine output but breathing has not improved. He denies dysuria. He denies recent changes in medications. He is also concerned about a fluid-filled vesicle in his LLE that is associated with surrounding redness and swelling. He denies fever or chills. He has a L BKA and is worried he may loose his R leg. States he believes he may have scraped the affected area but does not remember. He has chronic neuropathy up to his upper calf. He has no calf pain or pain over the site of the wound. Follows up at [facility]  wound care center. He has been using a cream but does not recall the name. He denies fever, chills, and night sweats.   Meds:  No outpatient medications have been marked as taking for the 06/17/18 encounter [facility] Encounter).  Gabapentin 600 mg qd prn  Imdur 30 mg xl qd Losartan 25 mg qd  humalog 22-25 units tid before meals  lipitor 20 mg qd  Toprol-XL 25 mg  Warfarin 5 mg qd protonix 40 mg qd prn  Nitro .4 prn prn chest pain trulicity .75 mg qweekly Lasix 80 mg qd    Allergies: Allergies as of 06/17/2018 - Review Complete 06/17/2018  Allergen Reaction Noted   Simvastatin Diarrhea and Other (See Comments) 04/13/2014   Past Medical History:  Diagnosis Date   Anginal pain ([place])    last pm   Atrial fibrillation ([place])    a. Transient during 03/2013 admission.   Bradycardia    a. Bradycardia/pauses/possible Mobitz II during 03/2013 adm. Not on BB due to this.   CAD (coronary artery disease)    a. s/p CABG 2002. b. Hx Cypher stent to the RCA. c. Inf-lat STEMI 03/2013:  LHC (04/05/13):  mLAD occluded, pD1 90, apical br of Dx occluded, CFX occluded, pOM1 90-95, RCA stents patent, diff RCA  30, S-Dx occluded, S-PDA occluded, S-OM1 40-50, L-LAD patent, EF 40% with inf HK.  PCI:  Promus (2.5 x 28) DES to mid to dist CFX.   Charcot's joint of knee    COPD (chronic obstructive pulmonary disease) (HCC)    Deep venous thrombosis ([place])    right lower extremity   Diabetes mellitus    a. A1C 10.7 in 03/2013.   Diastolic CHF ([place])    a. EF 40% by cath, 55-60% during 03/2013 adm, required IV diuresis.   DVT, lower extremity, recurrent ([place])    a. Hx recurrent DVT per record.   Dyslipidemia    Elevated CK    a. Pt has refused rheum workup in the past.   GERD (gastroesophageal reflux disease)    History of hiatal hernia    HTN (hypertension)    x 15 years   Hx of cardiovascular stress test    a. Lexiscan Myoview (03/2010):  diaph atten vs inf scar, no ischemia, EF 47%; Low  Risk.   Hx of echocardiogram    a. Echo (04/08/13):  Mild LVH, EF 55-60%, restrictive physiology, severe LAE, mild reduced RVSF, mild RAE.   Leg pain    ABI 6/16:  R 1.2, L 1.1 - normal   Myocardial infarction (HCC)    Obesity    Peripheral neuropathy    Pulmonary embolism (HCC)    Sleep apnea     Family History:  Family History  Problem Relation Age of Onset   Heart disease Mother    Hypertension Mother    Cancer Father    Hypertension Sister    Heart attack Neg Hx    Stroke Neg Hx     Social History:  He has no smoking or tobacco history  He lives at home with his wife.   Review of Systems: A complete ROS was negative except as per HPI.   Physical Exam: Blood pressure (!) 150/81, pulse (!) 44, temperature 97.9 F (36.6 C), temperature source Oral, resp. rate 14, height 6' (1.829 m), weight 121.6 kg, SpO2 92 %.  Constitution: anxious, sitting up in bed HEENT: [place]/AT, EOM intact Cardio: bradycardic, irregular rhythm, no m/r/g  Respiratory: CTA, no wheezing rales or rhonchi  Abdominal: central obesity, soft, NTTP, +bs MSK: trace LE edema, Left BKA, moving extremities Neuro: a&o, anxious affect  Skin: anterior right shin +erythema & warmth, 4x4cm, bullae with clear fluid     EKG: personally reviewed my interpretation is atrial flutter   CXR: personally reviewed my interpretation is left sided basilar atalectasis  Assessment & Plan by Problem: Active Problems:   Bradycardia  63yo male with PMH CAD with CABG 2002 and RCA DES, 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  Bradycardia New Onset Dyspnea EKG showing atrial flutter with ventricular bradycardia. 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 Last echo 2018 showed normal EF and diastolic function and left atrial enlargement. He takes lasix 80 mg po qd and has upped his dose to bid over the past several days. Baseline weight unclear. His dyspnea may be secondary to his ventricular bradycardia vs. New onset heart failure although BNP is 307. Baseline weight is 270 and he is currently 268. No recent changes in medications or current  medications that are likely causing arrhythmia.   - consulted cardiology, appreciate recommendations  - holding lasix 80 mig, cozaar 25 mg qd, toprol-xl 25 mg, and imdur 30 mg qd  - ECHO ordered - am CMP, CBC, TSH  - strict I/O's - daily weights - admit to telemetry  - am EKG   RLE Cellulitis  RLE erythema and warmth, possible cellulitis without purulent drainage. Previous L BKA 2018 for non-healing burn and infection, unclear infectious species per chart review.   -  cefazolin IV 2 mg q8h for five days - will likely switch to po    Acute Kidney Injury Likely secondary to overdiuresis as he has doubled his lasix dose from 80mg  po qd to 80 bid over the last several days. He took his lasix once this morning.   - hold lasix, will reassess tomorrow  - hold losartan - am CMP   TIIDM  Takes trulicity .75 mg qweekly and humalog 22-25U tid before meals   - HgbA1c - novolog 5U TID  - SSI - resistant TID with meals  - SSI qhs  Hx of Afib On Toprol 25 mg qd   - holding for a.flutter with bradycardia  Dispo: Admit patient to Inpatient with expected length of stay greater than 2 midnights.  SignedMarty Heck, DO 06/17/2018, 7:58 PM  Pager: [phone]

## 2018-06-17 NOTE — Consult Note (Signed)
Cardiology Admission History and Physical:   Patient ID: Bobby Lindsey MRN: [id number]; DOB: December 20, 1955   Admission date: 06/17/2018  Primary Care Provider: Adin Hector, MD Primary Cardiologist: Minus Breeding, MD  Primary Electrophysiologist:  None   Chief Complaint:  Lower ext edema and drainage despite Lasix 160 mg daily  Bobby Lindsey is a 63 y.o. male who is being seen today for the evaluation of bradycardia and CHF at the request of Dr. Lynnae January.   Patient Profile:   Bobby Lindsey is a 63 y.o. male with CAD with CABG 2002 and STEMI 2015, with graft failure and DES to LCX, PAF recurrent PE/DVT, HTN, HLD, DM and OSA no longer wearing CPAP,  With amiodarone and BB he had MOBITZ 2 and BB stopped, PAD with PCI of L mid to distal Ant tib, 2018.  Also Lt BKA at [facility]. Now presents with Rt lower ext edema and drainage  History of Present Illness:   Bobby Lindsey with above hx presented with lower ext edema of Rt leg and drainage despite his Lasix 160 mg daily.  On EKG HR was 45 with a flutter.  He is on lopressor 25 daily.   Echo 2018 with EF 55-60% LA severely dilated Pk PA pressure 35 mm HG  CT of his chest with new New patchy atelectasis at the left lung base with new pleural calcifications on the left hemidiaphragm. New nonspecific slight mediastinal and bilateral hilar adenopathy. This could be reactive. Does the patient have any history of malignancy?  INR therapeutic  Troponin < 0.03,  BNP 307 Na 137, K+ 4.4, glucose 279 Cr 1.28.   EKG:  The ECG that was done 06/17/18 was personally reviewed and demonstrates HR 45 with a flutter In Feb 2020 on EKG was in SR.  CXR Stable left basilar opacities are noted most consistent with scarring or subsegmental atelectasis with associated pleural Thickening.  Venous doppler of Rt leg neg for DVT. Preliminary negative for DVT.  Currently still orthopneic. Worried about blisters on shin. No fever    Past Medical History:    Diagnosis Date   Anginal pain ([place])    last pm   Atrial fibrillation ([place])    a. Transient during 03/2013 admission.   Bradycardia    a. Bradycardia/pauses/possible Mobitz II during 03/2013 adm. Not on BB due to this.   CAD (coronary artery disease)    a. s/p CABG 2002. b. Hx Cypher stent to the RCA. c. Inf-lat STEMI 03/2013:  LHC (04/05/13):  mLAD occluded, pD1 90, apical br of Dx occluded, CFX occluded, pOM1 90-95, RCA stents patent, diff RCA 30, S-Dx occluded, S-PDA occluded, S-OM1 40-50, L-LAD patent, EF 40% with inf HK.  PCI:  Promus (2.5 x 28) DES to mid to dist CFX.   Charcot's joint of knee    COPD (chronic obstructive pulmonary disease) (HCC)    Deep venous thrombosis ([place])    right lower extremity   Diabetes mellitus    a. A1C 10.7 in 03/2013.   Diastolic CHF ([place])    a. EF 40% by cath, 55-60% during 03/2013 adm, required IV diuresis.   DVT, lower extremity, recurrent ([place])    a. Hx recurrent DVT per record.   Dyslipidemia    Elevated CK    a. Pt has refused rheum workup in the past.   GERD (gastroesophageal reflux disease)    History of hiatal hernia    HTN (hypertension)    x 15 years  Hx of cardiovascular stress test    a. Lexiscan Myoview (03/2010):  diaph atten vs inf scar, no ischemia, EF 47%; Low Risk.   Hx of echocardiogram    a. Echo (04/08/13):  Mild LVH, EF 55-60%, restrictive physiology, severe LAE, mild reduced RVSF, mild RAE.   Leg pain    ABI 6/16:  R 1.2, L 1.1 - normal   Myocardial infarction ([place])    Obesity    Peripheral neuropathy    Pulmonary embolism ([place])    Sleep apnea     Past Surgical History:  Procedure Laterality Date   CORONARY ANGIOPLASTY WITH STENT PLACEMENT     CORONARY ARTERY BYPASS GRAFT     4 time since 2002   LEFT HEART CATHETERIZATION WITH CORONARY/GRAFT ANGIOGRAM  04/05/2013   Procedure: LEFT HEART CATHETERIZATION WITH Beatrix Fetters;  Surgeon: Peter M Martinique, MD;  Location: [facility] C[facility] LAB;   Service: Cardiovascular;;   left knee surgery     LOWER EXTREMITY ANGIOGRAPHY Left 12/25/2016   Procedure: LOWER EXTREMITY ANGIOGRAPHY;  Surgeon: Algernon Huxley, MD;  Location: [facility] CV LAB;  Service: Cardiovascular;  Laterality: Left;   PERCUTANEOUS CORONARY STENT INTERVENTION (PCI-S)  04/05/2013   Procedure: PERCUTANEOUS CORONARY STENT INTERVENTION (PCI-S);  Surgeon: Peter M Martinique, MD;  Location: [facility] CATH LAB;  Service: Cardiovascular;;  DES to native Mid cx   VASCULAR SURGERY       Medications Prior to Admission: Prior to Admission medications   Medication Sig Start Date End Date Taking? Authorizing Provider  acetaminophen (TYLENOL) 500 MG tablet Take 1,000 mg every 8 (eight) hours as needed by mouth for mild pain or headache.     [provider]  atorvastatin (LIPITOR) 20 MG tablet Take 1 tablet (20 mg total) by mouth daily. 04/03/18   Minus Breeding, MD  azelastine (ASTELIN) 0.1 % nasal spray Place into the nose. 04/06/17 11/14/18  [provider]  Blood Glucose Monitoring Suppl (FIFTY50 GLUCOSE METER 2.0) w/Device KIT Use as directed. 04/27/14   [provider]  furosemide (LASIX) 80 MG tablet Take 1 tablet (80 mg total) by mouth daily. 04/03/18   Minus Breeding, MD  gabapentin (NEURONTIN) 300 MG capsule Take 600 mg daily as needed by mouth (pain in foot and ankle).     [provider]  insulin lispro (HUMALOG KWIKPEN) 100 UNIT/ML injection Inject 22-25 Units 3 (three) times daily before meals into the skin. Per sliding scale    [provider]  Insulin Syringe-Needle U-100 31G X 5/16" 0.3 ML MISC use as directed 02/12/08   [provider]  isosorbide mononitrate (IMDUR) 30 MG 24 hr tablet Take 1 tablet (30 mg total) by mouth daily. 04/03/18   Minus Breeding, MD  losartan (COZAAR) 25 MG tablet Take 1 tablet (25 mg total) by mouth daily. 04/03/18   Minus Breeding, MD  metoprolol succinate (TOPROL-XL) 25 MG 24 hr tablet Take 0.5  tablets (12.5 mg total) by mouth daily. 04/03/18   Minus Breeding, MD  nitroGLYCERIN (NITROSTAT) 0.4 MG SL tablet Place 1 tablet (0.4 mg total) under the tongue every 5 (five) minutes as needed for chest pain. NEEDS APPOINTMENT 08/15/16   Burtis Junes, NP  pantoprazole (PROTONIX) 40 MG tablet TAKE 1 TABLET (40 MG TOTAL) BY MOUTH AS NEEDED (INDIGESTION). 06/08/18   Minus Breeding, MD  TRULICITY 3.87 FI/4.3PI SOPN INJECT 0.75 MG SUBCUTANEOUSLY EVERY 7 (SEVEN) DAYS. 11/27/16   [provider]  warfarin (COUMADIN) 5 MG tablet Take 1 tablet (5  mg total) by mouth as directed. Take as directed by the coumadin clinic. 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: Not on file   Food insecurity:    Worry: Not on file    Inability: Not on file   Transportation needs:    Medical: Not on file    Non-medical: Not on file  Tobacco Use   Smoking status: Never Smoker   Smokeless tobacco: Never Used  Substance and Sexual Activity   Alcohol use: Yes    Comment: rare   Drug use: No   Sexual activity: Yes  Lifestyle   Physical activity:    Days per week: Not on file    Minutes per session: Not on file   Stress: Not on file  Relationships   Social connections:    Talks on phone: Not on file    Gets together: Not on file    Attends religious service: Not on file    Active member of club or organization: Not on file    Attends meetings of clubs or organizations: Not on  file    Relationship status: Not on file   Intimate partner violence:    Fear of current or ex partner: Not on file    Emotionally abused: Not on file    Physically abused: Not on file    Forced sexual activity: Not on file  Other Topics Concern   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     Family History:   The patient's family history includes Cancer in his father; Heart disease in his mother; Hypertension in his mother and sister. There is no history of Heart attack or Stroke.    ROS:  Please see the history of present illness.  General:no colds or fevers, no weight changes Skin:no rashes or ulcers HEENT:no blurred vision, no congestion CV:see HPI PUL:see HPI GI:no diarrhea constipation or melena, no indigestion GU:no hematuria, no dysuria MS:no joint pain, no claudication, + edema Neuro:no syncope, no lightheadedness Endo:no diabetes, no thyroid disease All other ROS reviewed and negative.     Physical Exam/Data:   Vitals:   06/17/18 1316 06/17/18 1330 06/17/18 1345 06/17/18 1400  BP: (!) 141/69 (!) 144/71 (!) 129/91 (!) 151/77  Pulse: (!) 45 (!) 44 (!) 41 (!) 44  Resp:    17  Temp:      TempSrc:      SpO2: 93% 93% 93% 92%  Weight:        Intake/Output Summary (Last 24 hours) at 06/17/2018 1819 Last data filed at 06/17/2018 1424 Gross per 24 hour  Intake --  Output 500 ml  Net -500 ml   Last 3 Weights 06/17/2018 04/03/2018 01/09/2017  Weight (lbs) 264 lb 8.8 oz 264 lb 6.4 oz 249 lb  Weight (kg) 120 kg 119.931 kg 112.946 kg  Body mass index is 35.88 kg/m.  General:  Well nourished, well developed, in no acute distress. Severely obese. Orthopneic HEENT: normal Lymph: no adenopathy Neck: 10 cm JVD Endocrine:  No thryomegaly Vascular: No carotid bruits; FA pulses 2+ bilaterally without bruits  Cardiac:  normal S1, S2; RRR; no murmur  Lungs:  clear to auscultation bilaterally, no wheezing, rhonchi or rales  Abd:  soft, nontender, no hepatomegaly  Ext: 2+ edema and blisters right shin s/p L BKA Musculoskeletal:  No deformities, BUE and BLE strength normal and equal Skin: warm and dry  Neuro:  CNs 2-12 intact, no focal abnormalities noted Psych:  Normal affect    Relevant CV Studies: Echo 2018 Study Conclusions  - Left ventricle: The cavity size was normal. There was mild focal   basal hypertrophy of the septum. Systolic function was normal.   The estimated ejection fraction was in the range of 55% to 60%.   Wall motion was normal; there were no regional wall motion   abnormalities. Left ventricular diastolic function parameters   were normal. - Mitral valve: Calcified annulus. - Left atrium: The atrium was severely dilated. - Pulmonary arteries: Systolic pressure was mildly increased. PA   peak pressure: 35 mm Hg (S).   Laboratory Data:  Chemistry Recent Labs  Lab 06/17/18 1202  NA 137  K 4.4  CL 99  CO2 27  GLUCOSE 279*  BUN 20  CREATININE 1.28*  CALCIUM 9.2  GFRNONAA 59*  GFRAA >60  ANIONGAP 11    No results for input(s): PROT, ALBUMIN, AST, ALT, ALKPHOS, BILITOT in the last 168 hours. Hematology Recent Labs  Lab 06/17/18 1202  WBC 9.9  RBC 5.28  HGB 15.0  HCT 45.8  MCV 86.7  MCH 28.4  MCHC 32.8  RDW 14.5  PLT 196   Cardiac Enzymes Recent Labs  Lab 06/17/18 1327  TROPONINI <0.03   No results for input(s): TROPIPOC in the last 168 hours.  BNP Recent Labs  Lab 06/17/18 1327  BNP 307.1*    DDimer No results for input(s): DDIMER in the last 168 hours.  Radiology/Studies:  Dg Chest 2 View  Result Date: 06/17/2018 CLINICAL DATA:  Shortness of breath. EXAM: CHEST - 2 VIEW COMPARISON:  Radiographs of November 23, 2016. FINDINGS: Stable cardiomegaly. Status post coronary bypass graft. No pneumothorax is noted. No pleural effusion is noted. Stable left basilar opacities are noted most consistent with scarring or subsegmental atelectasis with associated pleural  thickening. Right lung is clear. Bony thorax is unremarkable. IMPRESSION: Stable left basilar opacities are noted most consistent with scarring or subsegmental atelectasis with associated pleural thickening. Electronically Signed   By: Marijo Conception M.D.   On: 06/17/2018 13:01   Ct Angio Chest Pe W/cm &/or Wo Cm  Result Date: 06/17/2018 CLINICAL DATA:  Chest pain and shortness of breath. EXAM: CT ANGIOGRAPHY CHEST WITH CONTRAST TECHNIQUE: Multidetector CT imaging of the chest was performed using the standard protocol during bolus administration of intravenous contrast. Multiplanar CT image reconstructions and MIPs were obtained to evaluate the vascular anatomy. CONTRAST:  188m OMNIPAQUE IOHEXOL 350 MG/ML SOLN COMPARISON:  Chest x-ray dated 06/17/2018 and chest CT dated 04/18/2010 FINDINGS: Cardiovascular: No pulmonary emboli. There is pulmonary vascular congestion. There is new borderline cardiomegaly since 2012. CABG. No pericardial effusion. Mediastinum/Nodes: The patient has developed slight bilateral hilar and mediastinal adenopathy, slightly more prominent on the right than the left. This is nonspecific. Lungs/Pleura: There are focal areas of atelectasis at the left lung  base. There is pleural calcification on the left hemidiaphragm. There is adjacent pleural thickening. There is tracheobronchomalacia, new since the prior study. There is a patchy mosaic appearance of the density of both lungs which probably represents small airway disease. Upper Abdomen: Cholelithiasis, chronic. Musculoskeletal: No chest wall abnormality. No acute or significant osseous findings. Review of the MIP images confirms the above findings. IMPRESSION: 1. No pulmonary emboli. 2. Interval development of borderline cardiomegaly since 2012. 3. 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 4. New patchy atelectasis at the left lung base with new pleural calcifications on the  left hemidiaphragm. 5. New nonspecific slight mediastinal and bilateral hilar adenopathy. This could be reactive. Does the patient have any history of malignancy? Electronically Signed   By: Lorriane Shire M.D.   On: 06/17/2018 15:17   Vas [place] Lower Extremity Venous (dvt) (only Mc & Wl 7a-7p)  Result Date: 06/17/2018  Lower Venous Study Indications: SOB, and blistering / drainage right shin.  Limitations: Body habitus. Performing Technologist: June Leap RDMS, RVT  Examination Guidelines: A complete evaluation includes B-mode imaging, spectral Doppler, color Doppler, and power Doppler as needed of all accessible portions of each vessel. Bilateral testing is considered an integral part of a complete examination. Limited examinations for reoccurring indications may be performed as noted.  +---------+---------------+---------+-----------+----------+-------+  RIGHT     Compressibility Phasicity Spontaneity Properties Summary  +---------+---------------+---------+-----------+----------+-------+  CFV       Full            Yes       Yes                             +---------+---------------+---------+-----------+----------+-------+  SFJ       Full                                                      +---------+---------------+---------+-----------+----------+-------+  FV Prox   Full                                                      +---------+---------------+---------+-----------+----------+-------+  FV Mid    Full                                                      +---------+---------------+---------+-----------+----------+-------+  FV Distal Full                                                      +---------+---------------+---------+-----------+----------+-------+  PFV       Full                                                      +---------+---------------+---------+-----------+----------+-------+  POP       Full            Yes       Yes                              +---------+---------------+---------+-----------+----------+-------+  PTV       Full                                                      +---------+---------------+---------+-----------+----------+-------+  PERO      Full                                                      +---------+---------------+---------+-----------+----------+-------+   +----+---------------+---------+-----------+----------+--------------+  LEFT Compressibility Phasicity Spontaneity Properties Summary         +----+---------------+---------+-----------+----------+--------------+  CFV                                                   Not visualized  +----+---------------+---------+-----------+----------+--------------+     Summary: Right: There is no evidence of deep vein thrombosis in the lower extremity. No cystic structure found in the popliteal fossa.  *See table(s) above for measurements and observations.    Preliminary     Assessment and Plan:   1. Acute CHF with Rt lower ext edema and hx LBKA.  Pt has increased his lasix to 160 mg from 80 mg daily off and on for last 7 days.  Has not seen much improvement.  He also has a flutter new from 03/2018.  His INR is therapeutic.  Neg CTA for PE and neg DVT on venous doppler.  Admit and diuresis.   In 2018 EF was normal but has been to 40% at times.  Will check Echo, 2. bradycardia with hx of Mobitz 2 on amiodarone and BB.  Now on BB alone, will stop.  May need EP consult  3. A flutter with hx of a fib transiently in 2015.  Hold BB monitor for tachycardia.  The slow HR  And flutter may be contributing to his HF.  4. CAD with hx CABG and has had stents since with last cath 2015 patent LIMA to LAD, patent VG to OM, occluded VG to diag. Occluded VG to PDA, patent stents in native RCA and successful stenting of the mid to distal native LCx with DES.   5. Hx of recurrent PE/DVT on coumadin and neg PE and DVT this admit.  Hold coumadin and add lovenox until decision made ? A flutter ablation  or PPM  6. Leg wounds will have wound care eval. 7. PAD with hx of stent and Lt BKA 8. DM-2 add SSI  Managed at [facility]. OSA no longer tolerates CPAP 10. HTN will need to adjust meds with stopping BB now BP 183/131?  11. HLD continue statin last statin 04/2018 was 63 on lipitor 20  Severity of Illness: The appropriate patient status for this patient is INPATIENT. Inpatient status is judged to be reasonable  and necessary in order to provide the required intensity of service to ensure the patient's safety. The patient's presenting symptoms, physical exam findings, and initial radiographic and laboratory data in the context of their chronic comorbidities is felt to place them at high risk for further clinical deterioration. Furthermore, it is not anticipated that the patient will be medically stable for discharge from the hospital within 2 midnights of admission. The following factors support the patient status of inpatient.   " The patient's presenting symptoms include SOB, edema not responding to lasix at higher dose, and a flutter with slow response.   " The worrisome physical exam findings include significant edema. " The initial radiographic and laboratory data are worrisome because of bradycardia . " The chronic co-morbidities include CAD, PAD    * I certify that at the point of admission it is my clinical judgment that the patient will require inpatient hospital care spanning beyond 2 midnights from the point of admission due to high intensity of service, high risk for further deterioration and high frequency of surveillance required.*    For questions or updates, please contact [place] Please consult [email or web address] for contact info under        Signed, Cecilie Kicks, NP  06/17/2018 6:19 PM    I have seen and examined the patient along with Cecilie Kicks, NP.  I have reviewed the chart, notes and new data.  I agree with NP's note.  Key new complaints: orthopnea and edema, no  angina, unaware of  Key examination changes: obese, JVD present, edema and blisters R shin, but weight is minimally changed from visit with Dr. Percival Spanish in February. Key new findings / data: atrial flutter with high grad AV block and ventricular rate of 44 bpm.   PLAN: Biventricular HF, apparently triggered by atrial arrhythmia (and subsequent marked bradycardia), considering the absence of serious evidence of hypervolemia. IV diuretics next 24h, until orthopnea resolves, but I think he will need DC cardioversion to return to previous baseline. INR > 2 for last 2 months.  This procedure has been fully reviewed with the patient and informed consent has been obtained. Tentative plan for CV on Wednesday, after diuresis. Stop beta blocker due to bradycardia.   Sanda Klein, MD, [place] [phone] 06/17/2018, 8:04 PM

## 2018-06-17 NOTE — ED Triage Notes (Signed)
Pt in with sob since last last night. Takes 160mg  Lasix daily, reports new and incr R leg swelling. Denies any fevers, cough or cp. Blistering and serosanguinous drainage from RLE

## 2018-06-17 NOTE — Telephone Encounter (Signed)
° ° °  Please return call to patient °

## 2018-06-17 NOTE — ED Provider Notes (Addendum)
[facility] EMERGENCY DEPARTMENT Provider Note   CSN: [id number] Arrival date & time: 06/17/18  1107   History   Chief Complaint Chief Complaint  Patient presents with  . Shortness of Breath  . Chest Pain  . Leg Swelling   HPI Bobby Lindsey is a 63 y.o. male with a hx of paroxysmal afib & prior RLE DVT & PE on coumadin, CAD, COPD, diastolic CHF last EF 20-35%, DM, GERD, dyslipidemia, HTN, & sleep apnea who presents to the ED with complaints of dyspnea x 8-9 days and RLE pain/swelling for the past 1 week. Patient notes progressively worsening dyspnea- worse when supine and with exertion, alleviated some with rest. Had 3 brief episodes of chest discomfort last night- none at present.  Notes dry cough. Also mention some right lower extremity swelling which has been progressively worsening over the past 1 week.  He states that he has developed fluid blisters to the anterior lower leg.  At baseline he has diabetic neuropathy, states the area is not painful.  Denies fever, chills, nausea, vomiting, diaphoresis, or syncope.  He has been taking his warfarin as prescribed.  He states this feels similar to prior     HPI  Past Medical History:  Diagnosis Date  . Anginal pain ([place])    last pm  . Atrial fibrillation ([place])    a. Transient during 03/2013 admission.  . Bradycardia    a. Bradycardia/pauses/possible Mobitz II during 03/2013 adm. Not on BB due to this.  Marland Kitchen CAD (coronary artery disease)    a. s/p CABG 2002. b. Hx Cypher stent to the RCA. c. Inf-lat STEMI 03/2013:  LHC (04/05/13):  mLAD occluded, pD1 90, apical br of Dx occluded, CFX occluded, pOM1 90-95, RCA stents patent, diff RCA 30, S-Dx occluded, S-PDA occluded, S-OM1 40-50, L-LAD patent, EF 40% with inf HK.  PCI:  Promus (2.5 x 28) DES to mid to dist CFX.  Marland Kitchen Charcot's joint of knee   . COPD (chronic obstructive pulmonary disease) ([place])   . Deep venous thrombosis (HCC)    right lower extremity  . Diabetes mellitus     a. A1C 10.7 in 03/2013.  . Diastolic CHF ([place])    a. EF 40% by cath, 55-60% during 03/2013 adm, required IV diuresis.  . DVT, lower extremity, recurrent ([place])    a. Hx recurrent DVT per record.  . Dyslipidemia   . Elevated CK    a. Pt has refused rheum workup in the past.  . GERD (gastroesophageal reflux disease)   . History of hiatal hernia   . HTN (hypertension)    x 15 years  . Hx of cardiovascular stress test    a. Lexiscan Myoview (03/2010):  diaph atten vs inf scar, no ischemia, EF 47%; Low Risk.  Marland Kitchen Hx of echocardiogram    a. Echo (04/08/13):  Mild LVH, EF 55-60%, restrictive physiology, severe LAE, mild reduced RVSF, mild RAE.  . Leg pain    ABI 6/16:  R 1.2, L 1.1 - normal  . Myocardial infarction ([place])   . Obesity   . Peripheral neuropathy   . Pulmonary embolism ([place])   . Sleep apnea     Patient Active Problem List   Diagnosis Date Noted  . SOB (shortness of breath) 12/06/2017  . Uncontrolled type 2 diabetes mellitus with insulin therapy ([place]) 12/06/2017  . Primary osteoarthritis of left knee 11/02/2017  . Open wound 06/05/2017  . Diabetic ulcer of left lower leg ([place]) 05/16/2017  .  Acquired absence of left leg below knee ([place]) 02/28/2017  . Impaired mobility and activities of daily living 02/15/2017  . Atherosclerotic heart disease of native coronary artery without angina pectoris 01/05/2017  . Essential hypertension 12/19/2016  . Atherosclerosis of native arteries of the extremities with ulceration ([place]) 12/19/2016  . Peripheral vascular disease ([place]) 12/07/2016  . CHF (congestive heart failure) ([place]) 11/24/2016  . Demand ischemia ([place])   . 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 Stage 3 chronic kidney disease due to diabetes mellitus ([place]) 11/23/2016  . NSTEMI (non-ST elevated myocardial infarction) ([place]) 11/23/2016  . Acute diastolic CHF (congestive heart failure) ([place]) 11/23/2016  . Acute congestive heart failure ([place])   . Burn of left foot  11/21/2016  . Diabetic peripheral neuropathy ([place]) 11/21/2016  . Diabetic ulcer of right foot associated with type 2 diabetes mellitus, with fat layer exposed ([place]) 11/21/2016  . Diabetic peripheral vascular disease ([place]) 11/21/2016  . DDD (degenerative disc disease), lumbar 03/27/2016  . History of deep vein thrombosis (DVT) of lower extremity 03/26/2016  . History of pulmonary embolism 10/01/2015  . Nephropathy, diabetic ([place]) 10/01/2015  . 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 Chronic obstructive pulmonary disease, unspecified ([place]) 12/16/2013  . Medication management 04/14/2013  . History of recurrent DVT/E 04/11/2013  . Sleep apnea 04/11/2013  . Bradycardia 04/11/2013  . History of Elevated CK level  04/11/2013  . Class 1 obesity due to excess calories with serious comorbidity and body mass index (BMI) of 34.0 to 34.9 in adult 04/11/2013  . Peripheral neuropathy ([place])   . Charcot's arthropathy   . Heart failure, unspecified ([place]) 04/08/2013  . Paroxysmal atrial fibrillation ([place]) 04/07/2013  . Reactive depression (situational) 05/16/2011  . Long term (current) use of anticoagulants 09/21/2010  . 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 ED (erectile dysfunction) of organic origin 07/28/2010  . Hyperlipidemia 05/26/2008  . Hypertensive heart disease 05/26/2008  . Diabetes ([place]) 02/14/1991    Past Surgical History:  Procedure Laterality Date  . CORONARY ANGIOPLASTY WITH STENT PLACEMENT    . CORONARY ARTERY BYPASS GRAFT     4 time since 2002  . LEFT HEART CATHETERIZATION WITH CORONARY/GRAFT ANGIOGRAM  04/05/2013   Procedure: LEFT HEART CATHETERIZATION WITH Beatrix Fetters;  Surgeon: Peter M Martinique, MD;  Location: [facility] CATH LAB;  Service: Cardiovascular;;  . left knee surgery    . LOWER EXTREMITY ANGIOGRAPHY Left 12/25/2016   Procedure: LOWER EXTREMITY ANGIOGRAPHY;  Surgeon: Algernon Huxley, MD;  Location: [facility] CV LAB;  Service: Cardiovascular;   Laterality: Left;  . PERCUTANEOUS CORONARY STENT INTERVENTION (PCI-S)  04/05/2013   Procedure: PERCUTANEOUS CORONARY STENT INTERVENTION (PCI-S);  Surgeon: Peter M Martinique, MD;  Location: [facility] CATH LAB;  Service: Cardiovascular;;  DES to native Mid cx  . VASCULAR SURGERY          Home Medications    Prior to Admission medications   Medication Sig Start Date End Date Taking? Authorizing Provider  acetaminophen (TYLENOL) 500 MG tablet Take 1,000 mg every 8 (eight) hours as needed by mouth for mild pain or headache.     [provider]  atorvastatin (LIPITOR) 20 MG tablet Take 1 tablet (20 mg total) by mouth daily. 04/03/18   Minus Breeding, MD  azelastine (ASTELIN) 0.1 % nasal spray Place into the nose. 04/06/17 11/14/18  [provider]  Blood Glucose Monitoring Suppl (FIFTY50 GLUCOSE METER 2.0) w/Device KIT Use as directed. 04/27/14   [provider]  furosemide (LASIX) 80 MG tablet  Take 1 tablet (80 mg total) by mouth daily. 04/03/18   Minus Breeding, MD  gabapentin (NEURONTIN) 300 MG capsule Take 600 mg daily as needed by mouth (pain in foot and ankle).     [provider]  insulin lispro (HUMALOG KWIKPEN) 100 UNIT/ML injection Inject 22-25 Units 3 (three) times daily before meals into the skin. Per sliding scale    [provider]  Insulin Syringe-Needle U-100 31G X 5/16" 0.3 ML MISC use as directed 02/12/08   [provider]  isosorbide mononitrate (IMDUR) 30 MG 24 hr tablet Take 1 tablet (30 mg total) by mouth daily. 04/03/18   Minus Breeding, MD  losartan (COZAAR) 25 MG tablet Take 1 tablet (25 mg total) by mouth daily. 04/03/18   Minus Breeding, MD  metoprolol succinate (TOPROL-XL) 25 MG 24 hr tablet Take 0.5 tablets (12.5 mg total) by mouth daily. 04/03/18   Minus Breeding, MD  nitroGLYCERIN (NITROSTAT) 0.4 MG SL tablet Place 1 tablet (0.4 mg total) under the tongue every 5 (five) minutes as needed for chest pain. NEEDS APPOINTMENT 08/15/16    Burtis Junes, NP  pantoprazole (PROTONIX) 40 MG tablet TAKE 1 TABLET (40 MG TOTAL) BY MOUTH AS NEEDED (INDIGESTION). 06/08/18   Minus Breeding, MD  TRULICITY 9.38 BO/1.7PZ SOPN INJECT 0.75 MG SUBCUTANEOUSLY EVERY 7 (SEVEN) DAYS. 11/27/16   [provider]  warfarin (COUMADIN) 5 MG tablet Take 1 tablet (5 mg total) by mouth as directed. Take as directed by the coumadin clinic. 06/10/18   Minus Breeding, MD    Family History Family History  Problem Relation Age of Onset  . Heart disease Mother   . Hypertension Mother   . Cancer Father   . Hypertension Sister   . Heart attack Neg Hx   . Stroke Neg Hx     Social History Social History   Tobacco Use  . Smoking status: Never Smoker  . Smokeless tobacco: Never Used  Substance Use Topics  . Alcohol use: Yes    Comment: rare  . Drug use: No     Allergies   Simvastatin   Review of Systems Review of Systems  Constitutional: Negative for chills, diaphoresis and fever.  Respiratory: Positive for cough and shortness of breath.   Cardiovascular: Positive for chest pain and leg swelling.  Gastrointestinal: Negative for diarrhea, nausea and vomiting.  Skin: Positive for wound.  Neurological: Negative for syncope.       + for baseline peripheral neuropathy unchanged.   All other systems reviewed and are negative.  Physical Exam Updated Vital Signs BP 131/66 (BP Location: Right Arm)   Pulse (!) 44   Temp 98 F (36.7 C) (Oral)   Resp 20   Wt 120 kg   SpO2 95%   BMI 35.88 kg/m   Physical Exam Vitals signs and nursing note reviewed.  Constitutional:      General: He is not in acute distress.    Appearance: He is well-developed. He is not toxic-appearing.  HENT:     Head: Normocephalic and atraumatic.  Eyes:     General:        Right eye: No discharge.        Left eye: No discharge.     Conjunctiva/sclera: Conjunctivae normal.  Neck:     Musculoskeletal: Neck supple.  Cardiovascular:     Rate and Rhythm:  Bradycardia present. Rhythm irregular.     Comments: Intact distal pulses.  Pulmonary:     Effort: Pulmonary effort is  normal. No respiratory distress.     Breath sounds: Decreased breath sounds (throughout) present. No wheezing, rhonchi or rales.  Abdominal:     General: There is no distension.     Palpations: Abdomen is soft.     Tenderness: There is no abdominal tenderness.  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  Right lower extremity with 2+ edema to the lower leg.  There are fluid-filled blisters anteriorly with mild surrounding erythema, not overly warm to the touch, no purulent drainage.  Intact active range of motion to the right knee and ankle.  Nontender to palpation.  Skin:    General: Skin is warm and dry.     Findings: No rash.  Neurological:     Mental Status: He is alert.     Comments: Clear speech.  5 out of 5 strength with plantar dorsiflexion to the right lower extremity.  Lower extremity not tested secondary to AKA.  Psychiatric:        Behavior: Behavior normal.       ED Treatments / Results  Labs (all labs ordered are listed, but only abnormal results are displayed) Labs Reviewed  BASIC METABOLIC PANEL - Abnormal; Notable for the following components:      Result Value   Glucose, Bld 279 (*)    Creatinine, Ser 1.28 (*)    GFR calc non Af Amer 59 (*)    All other components within normal limits  BRAIN NATRIURETIC PEPTIDE - Abnormal; Notable for the following components:   B Natriuretic Peptide 307.1 (*)    All other components within normal limits  PROTIME-INR - Abnormal; Notable for the following components:   Prothrombin Time 26.0 (*)    INR 2.4 (*)    All other components within normal limits  SARS CORONAVIRUS 2 (HOSPITAL ORDER, [facility])  CBC  TROPONIN I    EKG EKG Interpretation  Date/Time:  Monday Jun 17 2018 11:52:38 EDT Ventricular Rate:   45 PR Interval:    QRS Duration: 90 QT Interval:  526 QTC Calculation: 454 R Axis:   69 Text Interpretation:  Atrial flutter with 5:1 A-V conduction Low voltage QRS Cannot rule out Inferior infarct , age undetermined Abnormal ECG When compared to prior, now a flutter.  No STEMI Confirmed by Antony Blackbird [id number]) on 06/17/2018 12:41:20 PM   Radiology Dg Chest 2 View  Result Date: 06/17/2018 CLINICAL DATA:  Shortness of breath. EXAM: CHEST - 2 VIEW COMPARISON:  Radiographs of November 23, 2016. FINDINGS: Stable cardiomegaly. Status post coronary bypass graft. No pneumothorax is noted. No pleural effusion is noted. Stable left basilar opacities are noted most consistent with scarring or subsegmental atelectasis with associated pleural thickening. Right lung is clear. Bony thorax is unremarkable. IMPRESSION: Stable left basilar opacities are noted most consistent with scarring or subsegmental atelectasis with associated pleural thickening. Electronically Signed   By: Marijo Conception M.D.   On: 06/17/2018 13:01   Ct Angio Chest Pe W/cm &/or Wo Cm  Result Date: 06/17/2018 CLINICAL DATA:  Chest pain and shortness of breath. EXAM: CT ANGIOGRAPHY CHEST WITH CONTRAST TECHNIQUE: Multidetector CT imaging of the chest was performed using the standard protocol during bolus administration of intravenous contrast. Multiplanar CT image reconstructions and MIPs were obtained to evaluate the vascular anatomy. CONTRAST:  131m OMNIPAQUE IOHEXOL 350 MG/ML SOLN COMPARISON:  Chest x-ray dated 06/17/2018 and chest CT dated 04/18/2010 FINDINGS: Cardiovascular:  No pulmonary emboli. There is pulmonary vascular congestion. There is new borderline cardiomegaly since 2012. CABG. No pericardial effusion. Mediastinum/Nodes: The patient has developed slight bilateral hilar and mediastinal adenopathy, slightly more prominent on the right than the left. This is nonspecific. Lungs/Pleura: There are focal areas of atelectasis at the left  lung base. There is pleural calcification on the left hemidiaphragm. There is adjacent pleural thickening. There is tracheobronchomalacia, new since the prior study. There is a patchy mosaic appearance of the density of both lungs which probably represents small airway disease. Upper Abdomen: Cholelithiasis, chronic. Musculoskeletal: No chest wall abnormality. No acute or significant osseous findings. Review of the MIP images confirms the above findings. IMPRESSION: 1. No pulmonary emboli. 2. Interval development of borderline cardiomegaly since 2012. 3. 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 4. New patchy atelectasis at the left lung base with new pleural calcifications on the left hemidiaphragm. 5. New nonspecific slight mediastinal and bilateral hilar adenopathy. This could be reactive. Does the patient have any history of malignancy? Electronically Signed   By: Lorriane Shire M.D.   On: 06/17/2018 15:17    Procedures Procedures (including critical care time)  Medications Ordered in ED Medications  sodium chloride flush (NS) 0.9 % injection 3 mL (has no administration in time range)    Prior results reviewed:  11/24/16 Echocardiogram:  Study Conclusions  - Left ventricle: The cavity size was normal. There was mild focal   basal hypertrophy of the septum. Systolic function was normal.   The estimated ejection fraction was in the range of 55% to 60%.   Wall motion was normal; there were no regional wall motion   abnormalities. Left ventricular diastolic function parameters   were normal. - Mitral valve: Calcified annulus. - Left atrium: The atrium was severely dilated. - Pulmonary arteries: Systolic pressure was mildly increased. PA   peak pressure: 35 mm Hg (S).  Initial Impression / Assessment and Plan / ED Course  I have reviewed the triage vital signs and the nursing notes.  Pertinent labs & imaging results that  were available during my care of the patient were reviewed by me and considered in my medical decision making (see chart for details).  Clinical Course as of Jun 17 1012  Mon Jun 17, 2018  1730 Patient being admitted.    [KM]    Clinical Course User Index [KM] Alveria Apley, PA-C  12:35: Attempted to assess patient- currently in x-ray.    Patient presents to the emergency department with progressively worsening dyspnea as well as right lower extremity swelling now with some fluid blisters.  Patient nontoxic-appearing, no apparent distress, vitals notable for bradycardia, patient is in atrial flutter, history of A. fib anticoagulated on Coumadin.  On exam he has decreased breath sounds.  His right lower extremity is noted to be edematous with fluid-filled blisters anteriorly with some mild surrounding erythema, no purulent drainage or significant warmth.  Intact active range of motion to joints above and below.  Neurovascularly intact distally with exception of decreased sensation which is baseline for him.   Work-up thus far reviewed: CBC: No anemia or leukocytosis. BMP: Creatinine mildly elevated, consistent with prior.  No significant electrolyte derangement.  Hyperglycemia noted. PT/INR: Therapeutic range.  Trop: WNL EKG: Aflutter as described above, no STEMI BNP: Mildly elevated.  CXR: Stable left basilar opacities are noted most consistent with scarring or subsegmental atelectasis with associated pleural thickening  Discussed findings  and plan of care with supervising physician Dr. Sherry Ruffing, he has recommended CT angio of the chest and right lower extremity Doppler ultrasound for further assessment for PE/DVT  CTA negative for PE. IMPRESSION: 1. No pulmonary emboli. 2. Interval development of borderline cardiomegaly since 2012. 3. 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 4. New patchy atelectasis at the left lung base with  new pleural calcifications on the left hemidiaphragm. 5. New nonspecific slight mediastinal and bilateral hilar adenopathy. This could be reactive. Does the patient have any history of malignancy?  DVT study & COVID testing pending at this time.   His history and physical exam is concerning for a CHF exacerbation, BNP mildly elevated, imaging without significant fluid overload, attempted to ambulate the patient and upon moving to the bedside desaturated to the upper 80s. He feels he needs admission for fluid removal. I feel this is reasonable. Again his RLE doppler is pending to r/o DVT, the blisters he has anteriorly are mildly erythematous, considering cellulitis- will discuss abx & diuresis options w/ admitting team. Will consult for unassigned medical admission.   Findings and plan of care discussed with supervising physician Dr. Sherry Ruffing who is in agreement.   16:36: CONSULT: Discussed case with internal medicine teaching service- accepts admission, will evaluate patient & administer lasix as well as abx as they feel is necessary. .   Final Clinical Impressions(s) / ED Diagnoses   Final diagnoses:  Acute on chronic congestive heart failure, unspecified heart failure type [facility])    ED Discharge Orders    None       Amaryllis Dyke, PA-C 06/17/18 1639    Tegeler, Gwenyth Allegra, MD 06/17/18 1640  [name] was evaluated in Emergency Department on 06/17/18 for the symptoms described in the history of present illness. He/she was evaluated in the context of the global COVID-19 pandemic, which necessitated consideration that the patient might be at risk for infection with the SARS-CoV-2 virus that causes COVID-19. Institutional protocols and algorithms that pertain to the evaluation of patients at risk for COVID-19 are in a state of rapid change based on information released by regulatory bodies including the CDC and federal and state organizations. These policies and algorithms  were followed during the patient's care in the ED.     Amaryllis Dyke, [place] 06/18/18 1014    Tegeler, Gwenyth Allegra, MD 06/18/18 [phone]

## 2018-06-17 NOTE — Telephone Encounter (Signed)
Spoke with pt and while on phone wife started to speak as well Per pt has noted right leg swelling and SOB for about 8 days and now blisters are appearing to right leg and bursting Per wife has off and on for 7 days doubled up on Lasix 160 mg but is afraid to continue without knowing what kidney functions are and edema has not changed also has watched salt intake for the last couple of weeks .Per pt wants to be seen is going to ED for eval and tx .Will forward to Dr Percival Spanish for review .Adonis Housekeeper
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
## 2018-06-17 NOTE — Telephone Encounter (Signed)
New message   Pt c/o swelling: STAT is pt has developed SOB within 24 hours  1) How much weight have you gained and in what time span? Patient does not know if he gained any weight  2) If swelling, where is the swelling located? Below knee and down to ankle on right leg  3) Are you currently taking a fluid pill? Yes   4) Are you currently SOB? yes  5) Do you have a log of your daily weights (if so, list)? No   6) Have you gained 3 pounds in a day or 5 pounds in a week? Does not know  7) Have you traveled recently?no

## 2018-06-17 NOTE — Progress Notes (Signed)
RLE venous duplex       has been completed. Preliminary results can be found under CV proc through chart review. Madelynn Malson, BS, RDMS, RVT   

## 2018-06-17 NOTE — ED Notes (Signed)
Murlean Iba Pt's sps. Contact Ph#[phone]. Pls contact with an update

## 2018-06-18 ENCOUNTER — Inpatient Hospital Stay (HOSPITAL_COMMUNITY): Payer: Managed Care, Other (non HMO)

## 2018-06-18 DIAGNOSIS — I509 Heart failure, unspecified: Secondary | ICD-10-CM

## 2018-06-18 DIAGNOSIS — I484 Atypical atrial flutter: Secondary | ICD-10-CM

## 2018-06-18 DIAGNOSIS — I361 Nonrheumatic tricuspid (valve) insufficiency: Secondary | ICD-10-CM

## 2018-06-18 LAB — PROTIME-INR
INR: 2.3 — ABNORMAL HIGH (ref 0.8–1.2)
Prothrombin Time: 25.2 seconds — ABNORMAL HIGH (ref 11.4–15.2)

## 2018-06-18 LAB — ECHOCARDIOGRAM LIMITED
Height: 72 in
Weight: 4119.96 oz

## 2018-06-18 LAB — CBC
HCT: 46 % (ref 39.0–52.0)
Hemoglobin: 15.3 g/dL (ref 13.0–17.0)
MCH: 28.3 pg (ref 26.0–34.0)
MCHC: 33.3 g/dL (ref 30.0–36.0)
MCV: 85.2 fL (ref 80.0–100.0)
Platelets: 182 10*3/uL (ref 150–400)
RBC: 5.4 MIL/uL (ref 4.22–5.81)
RDW: 14.4 % (ref 11.5–15.5)
WBC: 9.4 10*3/uL (ref 4.0–10.5)
nRBC: 0 % (ref 0.0–0.2)

## 2018-06-18 LAB — COMPREHENSIVE METABOLIC PANEL
ALT: 29 U/L (ref 0–44)
AST: 18 U/L (ref 15–41)
Albumin: 3.5 g/dL (ref 3.5–5.0)
Alkaline Phosphatase: 73 U/L (ref 38–126)
Anion gap: 12 (ref 5–15)
BUN: 20 mg/dL (ref 8–23)
CO2: 24 mmol/L (ref 22–32)
Calcium: 9.2 mg/dL (ref 8.9–10.3)
Chloride: 102 mmol/L (ref 98–111)
Creatinine, Ser: 1.1 mg/dL (ref 0.61–1.24)
GFR calc Af Amer: >60 mL/min (ref >60)
GFR calc non Af Amer: >60 mL/min (ref >60)
Glucose, Bld: 205 mg/dL — ABNORMAL HIGH (ref 70–99)
Potassium: 3.5 mmol/L (ref 3.5–5.1)
Sodium: 138 mmol/L (ref 135–145)
Total Bilirubin: 1.3 mg/dL — ABNORMAL HIGH (ref 0.3–1.2)
Total Protein: 7 g/dL (ref 6.5–8.1)

## 2018-06-18 LAB — GLUCOSE, CAPILLARY
Glucose-Capillary: 161 mg/dL — ABNORMAL HIGH (ref 70–99)
Glucose-Capillary: 242 mg/dL — ABNORMAL HIGH (ref 70–99)
Glucose-Capillary: 201 mg/dL — ABNORMAL HIGH (ref 70–99)
Glucose-Capillary: 193 mg/dL — ABNORMAL HIGH (ref 70–99)

## 2018-06-18 LAB — HIV ANTIBODY (ROUTINE TESTING W REFLEX): HIV Screen 4th Generation wRfx: NONREACTIVE

## 2018-06-18 LAB — MAGNESIUM: Magnesium: 2 mg/dL (ref 1.7–2.4)

## 2018-06-18 MED ORDER — RAMELTEON 8 MG PO TABS
8.0000 mg | ORAL_TABLET | Freq: Once | ORAL | Status: DC
  Filled 2018-06-18: qty 1

## 2018-06-18 MED ORDER — WARFARIN SODIUM 10 MG PO TABS
10.0000 mg | ORAL_TABLET | Freq: Once | ORAL | Status: AC
  Administered 2018-06-18: 10 mg via ORAL
  Filled 2018-06-18: qty 1

## 2018-06-18 MED ORDER — PERFLUTREN LIPID MICROSPHERE
1.0000 mL | INTRAVENOUS | Status: AC | PRN
  Administered 2018-06-18: 2 mL via INTRAVENOUS
  Filled 2018-06-18: qty 10

## 2018-06-18 MED ORDER — RAMELTEON 8 MG PO TABS
8.0000 mg | ORAL_TABLET | Freq: Once | ORAL | Status: AC
  Administered 2018-06-18: 8 mg via ORAL
  Filled 2018-06-18: qty 1

## 2018-06-18 MED ORDER — LIVING WELL WITH DIABETES BOOK
Freq: Once | Status: AC
  Administered 2018-06-18: 17:00:00
  Filled 2018-06-18: qty 1

## 2018-06-18 MED ORDER — INSULIN GLARGINE 100 UNIT/ML ~~LOC~~ SOLN
50.0000 [IU] | Freq: Every day | SUBCUTANEOUS | Status: DC
  Administered 2018-06-18: 50 [IU] via SUBCUTANEOUS
  Filled 2018-06-18 (×2): qty 0.5

## 2018-06-18 MED ORDER — CEPHALEXIN 500 MG PO CAPS
500.0000 mg | ORAL_CAPSULE | Freq: Two times a day (BID) | ORAL | Status: DC
  Administered 2018-06-18 – 2018-06-20 (×5): 500 mg via ORAL
  Filled 2018-06-18 (×5): qty 1

## 2018-06-18 NOTE — Progress Notes (Signed)
[place] for warfarin Indication: recurrent PE/DVT, PAF  Patient Measurements: Height: 6' (182.9 cm) Weight: 257 lb 8 oz (116.8 kg) IBW/kg (Calculated) : 77.6  Vital Signs: Temp: (P) 97.8 F (36.6 C) (05/05 0900) Temp Source: (P) Oral (05/05 0900) BP: 113/88 (05/05 0800) Pulse Rate: 44 (05/05 0800)  Labs: Recent Labs    06/17/18 1202 06/17/18 1327 06/17/18 2142 06/18/18 0324  HGB 15.0  --   --  15.3  HCT 45.8  --   --  46.0  PLT 196  --   --  182  LABPROT  --  26.0*  --  25.2*  INR  --  2.4*  --  2.3*  CREATININE 1.28*  --   --  1.10  TROPONINI  --  <0.03 <0.03  --     Assessment: 63 y/o male admitted with increased SOB and R leg swelling. He takes warfarin PTA for PAF and hx VTE. INR is therapeutic at 2.4. No bleeding noted, CBC is normal. CTA chest neg for PE, RLE duplex neg for DVT. Current INR is therapeutic 2.3.  PTA: 10 mg daily except 5 mg Mon/Fri per OP clinic visit 4/24, last taken 5/3  Goal of Therapy:  INR 2-3 Monitor platelets by anticoagulation protocol: Yes   Plan:  Warfarin 10 mg PO x1 Daily INR    Harvel Quale 06/18/2018 10:07 AM

## 2018-06-18 NOTE — Progress Notes (Signed)
RT Note:  Patient stated he does not use CPAP at home.  Refused CPAP.

## 2018-06-18 NOTE — Progress Notes (Signed)
Inpatient Diabetes Program Recommendations  AACE/ADA: New Consensus Statement on Inpatient Glycemic Control   Target Ranges:  Prepandial:   less than 140 mg/dL      Peak postprandial:   less than 180 mg/dL (1-2 hours)      Critically ill patients:  140 - 180 mg/dL   Results for Bobby Lindsey, Bobby Lindsey (MRN [id number]) as of 06/18/2018 09:36  Ref. Range 06/17/2018 21:11 06/18/2018 06:28  Glucose-Capillary Latest Ref Range: 70 - 99 mg/dL 224 (H) 161 (H)  Results for Bobby Lindsey, Bobby Lindsey (MRN [id number]) as of 06/18/2018 09:36  Ref. Range 06/17/2018 20:32  Hemoglobin A1C Latest Ref Range: 4.8 - 5.6 % 10.1 (H)   Review of Glycemic Control  Diabetes history: DM2 Outpatient Diabetes medications: Toujeo 70 units QAM, Metformin XR 500 mg QAM, Humalog 36 units TID with meals Current orders for Inpatient glycemic control: Novolog 0-20 units TID with meals, Novolog 0-5 units QHS, Novolog 5 units TID with meals  Inpatient Diabetes Program Recommendations:   Insulin - Basal: Patient reports that he took Toujeo 70 units at home on 06/17/18. Please consider ordering at least half of home dose of basal insulin; recommend ordering Lantus 35 units daily (to start now).  HgbA1C: A1C 10.1% on 06/17/18 indicating an average glucose of 243 mg/dl over the past 2-3 months.  NOTE: Spoke with patient over the phone about diabetes and home regimen for diabetes control. Patient reports that he is followed by Endocrinologist at [facility] for diabetes management. Patient states he takes Toujeo 70 units QAM (took at home on 06/17/18), Humalog 36 units TID with meals, and Metformin XR 500 mg QAM as an outpatient for diabetes control. Patient reports that he use to take Lantus but it was changed to a different long acting insulin. Patient reports that he was taking Trulicity but he stopped taking it due to GI side effects. Called patient's wife and she verifies that Lantus was changed to Toujeo several months ago. Patient reports that he is taking DM  medications as prescribed.  Patient states that he has a FreeStyle Libre (glucose monitoring sensor) and his glucose runs fairly well if he follows a diabetic diet but when he eats things he shouldn't eat, his glucose is much higher. Inquired about prior A1C and patient reports that A1C is usually 10-11% but he notes that his Endocrinologist has been encouraging him to get A1C down. Discussed A1C results (10.1% on 06/17/18) and explained that his current A1C indicates an average glucose of 243 mg/dl over the past 2-3 months. Discussed glucose and A1C goals. Discussed importance of checking CBGs and maintaining good CBG control to prevent long-term and short-term complications. Explained how hyperglycemia leads to damage within blood vessels which lead to the common complications seen with uncontrolled diabetes. Stressed to the patient the importance of improving glycemic control to prevent further complications from uncontrolled diabetes. Discussed impact of nutrition, exercise, stress, sickness, and medications on diabetes control. Patient states that he tries to do good with his diet but notes it is hard. Patient states that he would like additional education on Carb Modified diet and would benefit from meal planning examples and handouts.  Ordered RD consult and Living Well with DM book for further information and education.  Discussed carbohydrates, carbohydrate goals per day and meal, along with portion sizes. Encouraged patient to check his glucose 4 times per day (before meals and at bedtime) and to keep a log book of glucose readings and insulin taken which he will need to  take to doctor appointments. Encouraged patient to try to make better dietary choices and asked that he read Living Well with DM book to enhance knowledge on DM management.  Patient verbalized understanding of information discussed and he states that he has no further questions at this time related to diabetes.  Thanks, Barnie Alderman, RN,  MSN, CDE Diabetes Coordinator Inpatient Diabetes Program [phone] (Team Pager)

## 2018-06-18 NOTE — H&P (View-Only) (Signed)
Progress Note  Patient Name: Bobby Lindsey Date of Encounter: 06/18/2018  Primary Cardiologist: Minus Breeding, MD   Subjective   Mr. Baugher is feeling better this morning.  He is diuresed 2 L and is less short of breath.  His peripheral edema has improved as well.  Inpatient Medications    Scheduled Meds: . atorvastatin  20 mg Oral Daily  . furosemide  80 mg Intravenous BID  . insulin aspart  0-20 Units Subcutaneous TID WC  . insulin aspart  0-5 Units Subcutaneous QHS  . insulin aspart  5 Units Subcutaneous TID WC  . sodium chloride flush  3 mL Intravenous Once  . Warfarin - Pharmacist Dosing Inpatient   Does not apply q1800   Continuous Infusions: .  ceFAZolin (ANCEF) IV 2 g (06/17/18 2210)   PRN Meds: acetaminophen **OR** acetaminophen, gabapentin, perflutren lipid microspheres (DEFINITY) IV suspension, polyethylene glycol   Vital Signs    Vitals:   06/17/18 2108 06/17/18 2304 06/18/18 0413 06/18/18 0757  BP: 135/63 (!) 141/69 (!) 147/75 113/88  Pulse: (!) 48 (!) 45 (!) 44 (!) 44  Resp: 15 15 14  (!) 21  Temp: 97.6 F (36.4 C) 98 F (36.7 C) 97.7 F (36.5 C) 97.8 F (36.6 C)  TempSrc: Oral Oral Oral Oral  SpO2: 94% 95% 94% 93%  Weight:   116.8 kg   Height:        Intake/Output Summary (Last 24 hours) at 06/18/2018 0913 Last data filed at 06/18/2018 0631 Gross per 24 hour  Intake -  Output 2025 ml  Net -2025 ml   Last 3 Weights 06/18/2018 06/17/2018 06/17/2018  Weight (lbs) 257 lb 8 oz 268 lb 1.3 oz 264 lb 8.8 oz  Weight (kg) 116.8 kg 121.6 kg 120 kg      Telemetry    Atrial flutter flutter with ventricular spots of 44- Personally Reviewed  ECG    Aflutter - Personally Reviewed  Physical Exam   GEN: No acute distress.   Neck: No JVD Cardiac: RRR, no murmurs, rubs, or gallops.  Respiratory: Clear to auscultation bilaterally. GI: Soft, nontender, non-distended  MS: No edema; No deformity. Neuro:  Nonfocal  Psych: Normal affect  Extremities- right  lower extreme edema has significantly improved.  He still has some bullous changes.   L Labs    Chemistry Recent Labs  Lab 06/17/18 1202 06/18/18 0324  NA 137 138  K 4.4 3.5  CL 99 102  CO2 27 24  GLUCOSE 279* 205*  BUN 20 20  CREATININE 1.28* 1.10  CALCIUM 9.2 9.2  PROT  --  7.0  ALBUMIN  --  3.5  AST  --  18  ALT  --  29  ALKPHOS  --  73  BILITOT  --  1.3*  GFRNONAA 59* >60  GFRAA >60 >60  ANIONGAP 11 12     Hematology Recent Labs  Lab 06/17/18 1202 06/18/18 0324  WBC 9.9 9.4  RBC 5.28 5.40  HGB 15.0 15.3  HCT 45.8 46.0  MCV 86.7 85.2  MCH 28.4 28.3  MCHC 32.8 33.3  RDW 14.5 14.4  PLT 196 182    Cardiac Enzymes Recent Labs  Lab 06/17/18 1327 06/17/18 2142  TROPONINI <0.03 <0.03   No results for input(s): TROPIPOC in the last 168 hours.   BNP Recent Labs  Lab 06/17/18 1327  BNP 307.1*     DDimer No results for input(s): DDIMER in the last 168 hours.   Radiology  Dg Chest 2 View  Result Date: 06/17/2018 CLINICAL DATA:  Shortness of breath. EXAM: CHEST - 2 VIEW COMPARISON:  Radiographs of November 23, 2016. FINDINGS: Stable cardiomegaly. Status post coronary bypass graft. No pneumothorax is noted. No pleural effusion is noted. Stable left basilar opacities are noted most consistent with scarring or subsegmental atelectasis with associated pleural thickening. Right lung is clear. Bony thorax is unremarkable. IMPRESSION: Stable left basilar opacities are noted most consistent with scarring or subsegmental atelectasis with associated pleural thickening. Electronically Signed   By: Marijo Conception M.D.   On: 06/17/2018 13:01   Ct Angio Chest Pe W/cm &/or Wo Cm  Result Date: 06/17/2018 CLINICAL DATA:  Chest pain and shortness of breath. EXAM: CT ANGIOGRAPHY CHEST WITH CONTRAST TECHNIQUE: Multidetector CT imaging of the chest was performed using the standard protocol during bolus administration of intravenous contrast. Multiplanar CT image reconstructions  and MIPs were obtained to evaluate the vascular anatomy. CONTRAST:  155mL OMNIPAQUE IOHEXOL 350 MG/ML SOLN COMPARISON:  Chest x-ray dated 06/17/2018 and chest CT dated 04/18/2010 FINDINGS: Cardiovascular: No pulmonary emboli. There is pulmonary vascular congestion. There is new borderline cardiomegaly since 2012. CABG. No pericardial effusion. Mediastinum/Nodes: The patient has developed slight bilateral hilar and mediastinal adenopathy, slightly more prominent on the right than the left. This is nonspecific. Lungs/Pleura: There are focal areas of atelectasis at the left lung base. There is pleural calcification on the left hemidiaphragm. There is adjacent pleural thickening. There is tracheobronchomalacia, new since the prior study. There is a patchy mosaic appearance of the density of both lungs which probably represents small airway disease. Upper Abdomen: Cholelithiasis, chronic. Musculoskeletal: No chest wall abnormality. No acute or significant osseous findings. Review of the MIP images confirms the above findings. IMPRESSION: 1. No pulmonary emboli. 2. Interval development of borderline cardiomegaly since 2012. 3. 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 4. New patchy atelectasis at the left lung base with new pleural calcifications on the left hemidiaphragm. 5. New nonspecific slight mediastinal and bilateral hilar adenopathy. This could be reactive. Does the patient have any history of malignancy? Electronically Signed   By: Lorriane Shire M.D.   On: 06/17/2018 15:17   Vas [place] Lower Extremity Venous (dvt) (only Mc & Wl 7a-7p)  Result Date: 06/18/2018  Lower Venous Study Indications: SOB, and blistering / drainage right shin.  Limitations: Body habitus. Performing Technologist: June Leap RDMS, RVT  Examination Guidelines: A complete evaluation includes B-mode imaging, spectral Doppler, color Doppler, and power Doppler as needed of all  accessible portions of each vessel. Bilateral testing is considered an integral part of a complete examination. Limited examinations for reoccurring indications may be performed as noted.  +---------+---------------+---------+-----------+----------+-------+ RIGHT    CompressibilityPhasicitySpontaneityPropertiesSummary +---------+---------------+---------+-----------+----------+-------+ CFV      Full           Yes      Yes                          +---------+---------------+---------+-----------+----------+-------+ SFJ      Full                                                 +---------+---------------+---------+-----------+----------+-------+ FV Prox  Full                                                 +---------+---------------+---------+-----------+----------+-------+  FV Mid   Full                                                 +---------+---------------+---------+-----------+----------+-------+ FV DistalFull                                                 +---------+---------------+---------+-----------+----------+-------+ PFV      Full                                                 +---------+---------------+---------+-----------+----------+-------+ POP      Full           Yes      Yes                          +---------+---------------+---------+-----------+----------+-------+ PTV      Full                                                 +---------+---------------+---------+-----------+----------+-------+ PERO     Full                                                 +---------+---------------+---------+-----------+----------+-------+   +----+---------------+---------+-----------+----------+--------------+ LEFTCompressibilityPhasicitySpontaneityPropertiesSummary        +----+---------------+---------+-----------+----------+--------------+ CFV                                              Not visualized  +----+---------------+---------+-----------+----------+--------------+     Summary: Right: There is no evidence of deep vein thrombosis in the lower extremity. No cystic structure found in the popliteal fossa.  *See table(s) above for measurements and observations. Electronically signed by Ruta Hinds MD on 06/18/2018 at 2:36:42 AM.    Final     Cardiac Studies   TTE: pending  Patient Profile     63 y.o. male with CAD with CABG 2002 and STEMI 2015, with graft failure and DES to LCX, PAF recurrent PE/DVT, HTN, HLD, DM and OSA no longer wearing CPAP,  With amiodarone and BB he had MOBITZ 2 and BB stopped, PAD with PCI of L mid to distal Ant tib, 2018.  Also Lt BKA at [facility]. Presented with Rt lower ext edema and drainage  Assessment & Plan    1. Acute CHF with Rt lower ext edema and hx LBKA:  Pt had increased his lasix to 160 mg from 80 mg daily off and on for last 7 days prior to admission.   -- currently on 80mg  IV lasix BID. Net - 2L, weight trending down. Renal function stable. Would continue with diuretic.   2. Bradycardia with hx of Mobitz 2: BB stopped on admission.   3. A flutter with hx  of a fib transiently in 2015: HR stable, remains in aflutter. Planned outlined by Dr. Sallyanne Kuster for DCCV to restore SR. Discuss with MD regarding timing, possibly in the am.  4. CAD with hx CABG: Stents since with last cath 2015 patent LIMA to LAD, patent VG to OM, occluded VG to diag. Occluded VG to PDA, patent stents in native RCA and successful stenting of the mid to distal native LCx with DES.    5. Hx of recurrent PE/DVT: on coumadin and neg PE and DVT this admit. Coumadin dosing per PharmD. INR 2.3  6. Leg wounds: wound RN following.  7. HTN: stable  8. HLD:  continue statin last statin 04/2018 was 63.  9. DM: Hgb A1c 10.1. On SSI. Management per primary  For questions or updates, please contact [place] Please consult [email or web address] for contact info under   Signed, Reino Bellis,  NP  06/18/2018, 9:13 AM     Agree with note by Reino Bellis NP-C  Mr. Lundahl was admitted with lower extremity edema and some weeping of his right leg.  He has diuresed 2 L with IV Lasix.  His INR is have remained therapeutic.  He is in a flutter with a ventricular spots in the 40s.  2D echo was ordered but has not yet been resulted.  His exam is benign.  Lungs are clear.  His INR is 2.3.  He is scheduled for DC cardioversion tomorrow.  His BNP was low at 307 and his enzymes were negative.  Lorretta Harp, M.D., [place], [facility], [place] [address] [place], [place]  [address]  [phone] 06/18/2018 11:26 AM

## 2018-06-18 NOTE — Progress Notes (Signed)
   Subjective:  He feels that breathing has improved and he is able to lay flatter. He denies chest pain, palpitations. He does feel tired. He feels that the redness on his leg has improved but decided not to take his IV antibiotic this morning as he is worried it could interfere with his procedure tomorrow. He had trouble sleeping overnight states his stomach just feels off and nervous. He attributes this possibly to his hernia. He denies abdominal pain, nausea, or changes in BM.   Objective:  Vital signs in last 24 hours: Vitals:   06/17/18 1843 06/17/18 2108 06/17/18 2304 06/18/18 0413  BP: (!) 150/81 135/63 (!) 141/69 (!) 147/75  Pulse: (!) 44 (!) 48 (!) 45 (!) 44  Resp:  15 15 14   Temp: 97.9 F (36.6 C) 97.6 F (36.4 C) 98 F (36.7 C) 97.7 F (36.5 C)  TempSrc: Oral Oral Oral Oral  SpO2: 92% 94% 95% 94%  Weight: 121.6 kg   116.8 kg  Height: 6' (1.829 m)      Constitution: NAD, appears tired, sitting up in bed Cardio: irregular rate and rhythm, no m/r/g  Respiratory: CTA, no wheezing rales or rhonchi Abdominal: central obesity, umbilical hernia, NTTP, soft, non-distended MSK: Left BKA, no edema RLE Skin: anterior right shin +erythema & warmth improved from yesterday, 4x4cm, bullae with clear fluid  Assessment/Plan:  Active Problems:   Bradycardia   Cellulitis of right lower extremity  63yo male with PMH CAD with CABG 2002 and RCA DES, 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  A. Flutter with AV Block and Ventricular Bradycardia  New Onset Dyspnea AKI - resolved Continues to have ventricular bradycardia. Cardiology consulted and recommended IV lasix 80 mg bid and possible cardioversion tomorrow after diuresis. He is net - 1.6L since admission last night, renal function stable. EKG this am  similar to yesterdays.   - cont. Lasix 80 mg IV bid - ECHO pending - hold beta blocker  RLE Cellulitis  Erythema and warmth improved from yesterday. He declined his antibiotic this morning as he was worried this would interfere with his procedure tomorrow. He is interested in completed po abx and reassured him we will make sure it will not interfere with his procedure or warfarin.   - switch to po cephalexin for five days abx total  TIIDM Takes trujeo 70U qam, humalog 36U tid, and metformin 500 XR qd  - cont. SSI resistent - qhs SSI - novolog 5U with meals - start lantus 50U qam    VTE: warfarin IVF: none Diet: heart healthy/carb modified Code: full  Dispo: Anticipated discharge in approximately 2-3 days.   Marty Heck, DO 06/18/2018, 5:29 AM Pager: [phone]

## 2018-06-18 NOTE — Progress Notes (Signed)
Progress Note  Patient Name: Bobby Lindsey Date of Encounter: 06/18/2018  Primary Cardiologist: Minus Breeding, MD   Subjective   Bobby Lindsey is feeling better this morning.  He is diuresed 2 L and is less short of breath.  His peripheral edema has improved as well.  Inpatient Medications    Scheduled Meds: . atorvastatin  20 mg Oral Daily  . furosemide  80 mg Intravenous BID  . insulin aspart  0-20 Units Subcutaneous TID WC  . insulin aspart  0-5 Units Subcutaneous QHS  . insulin aspart  5 Units Subcutaneous TID WC  . sodium chloride flush  3 mL Intravenous Once  . Warfarin - Pharmacist Dosing Inpatient   Does not apply q1800   Continuous Infusions: .  ceFAZolin (ANCEF) IV 2 g (06/17/18 2210)   PRN Meds: acetaminophen **OR** acetaminophen, gabapentin, perflutren lipid microspheres (DEFINITY) IV suspension, polyethylene glycol   Vital Signs    Vitals:   06/17/18 2108 06/17/18 2304 06/18/18 0413 06/18/18 0757  BP: 135/63 (!) 141/69 (!) 147/75 113/88  Pulse: (!) 48 (!) 45 (!) 44 (!) 44  Resp: 15 15 14  (!) 21  Temp: 97.6 F (36.4 C) 98 F (36.7 C) 97.7 F (36.5 C) 97.8 F (36.6 C)  TempSrc: Oral Oral Oral Oral  SpO2: 94% 95% 94% 93%  Weight:   116.8 kg   Height:        Intake/Output Summary (Last 24 hours) at 06/18/2018 0913 Last data filed at 06/18/2018 0631 Gross per 24 hour  Intake -  Output 2025 ml  Net -2025 ml   Last 3 Weights 06/18/2018 06/17/2018 06/17/2018  Weight (lbs) 257 lb 8 oz 268 lb 1.3 oz 264 lb 8.8 oz  Weight (kg) 116.8 kg 121.6 kg 120 kg      Telemetry    Atrial flutter flutter with ventricular spots of 44- Personally Reviewed  ECG    Aflutter - Personally Reviewed  Physical Exam   GEN: No acute distress.   Neck: No JVD Cardiac: RRR, no murmurs, rubs, or gallops.  Respiratory: Clear to auscultation bilaterally. GI: Soft, nontender, non-distended  MS: No edema; No deformity. Neuro:  Nonfocal  Psych: Normal affect  Extremities- right  lower extreme edema has significantly improved.  He still has some bullous changes.   L Labs    Chemistry Recent Labs  Lab 06/17/18 1202 06/18/18 0324  NA 137 138  K 4.4 3.5  CL 99 102  CO2 27 24  GLUCOSE 279* 205*  BUN 20 20  CREATININE 1.28* 1.10  CALCIUM 9.2 9.2  PROT  --  7.0  ALBUMIN  --  3.5  AST  --  18  ALT  --  29  ALKPHOS  --  73  BILITOT  --  1.3*  GFRNONAA 59* >60  GFRAA >60 >60  ANIONGAP 11 12     Hematology Recent Labs  Lab 06/17/18 1202 06/18/18 0324  WBC 9.9 9.4  RBC 5.28 5.40  HGB 15.0 15.3  HCT 45.8 46.0  MCV 86.7 85.2  MCH 28.4 28.3  MCHC 32.8 33.3  RDW 14.5 14.4  PLT 196 182    Cardiac Enzymes Recent Labs  Lab 06/17/18 1327 06/17/18 2142  TROPONINI <0.03 <0.03   No results for input(s): TROPIPOC in the last 168 hours.   BNP Recent Labs  Lab 06/17/18 1327  BNP 307.1*     DDimer No results for input(s): DDIMER in the last 168 hours.   Radiology  Dg Chest 2 View  Result Date: 06/17/2018 CLINICAL DATA:  Shortness of breath. EXAM: CHEST - 2 VIEW COMPARISON:  Radiographs of November 23, 2016. FINDINGS: Stable cardiomegaly. Status post coronary bypass graft. No pneumothorax is noted. No pleural effusion is noted. Stable left basilar opacities are noted most consistent with scarring or subsegmental atelectasis with associated pleural thickening. Right lung is clear. Bony thorax is unremarkable. IMPRESSION: Stable left basilar opacities are noted most consistent with scarring or subsegmental atelectasis with associated pleural thickening. Electronically Signed   By: Marijo Conception M.D.   On: 06/17/2018 13:01   Ct Angio Chest Pe W/cm &/or Wo Cm  Result Date: 06/17/2018 CLINICAL DATA:  Chest pain and shortness of breath. EXAM: CT ANGIOGRAPHY CHEST WITH CONTRAST TECHNIQUE: Multidetector CT imaging of the chest was performed using the standard protocol during bolus administration of intravenous contrast. Multiplanar CT image reconstructions  and MIPs were obtained to evaluate the vascular anatomy. CONTRAST:  157mL OMNIPAQUE IOHEXOL 350 MG/ML SOLN COMPARISON:  Chest x-ray dated 06/17/2018 and chest CT dated 04/18/2010 FINDINGS: Cardiovascular: No pulmonary emboli. There is pulmonary vascular congestion. There is new borderline cardiomegaly since 2012. CABG. No pericardial effusion. Mediastinum/Nodes: The patient has developed slight bilateral hilar and mediastinal adenopathy, slightly more prominent on the right than the left. This is nonspecific. Lungs/Pleura: There are focal areas of atelectasis at the left lung base. There is pleural calcification on the left hemidiaphragm. There is adjacent pleural thickening. There is tracheobronchomalacia, new since the prior study. There is a patchy mosaic appearance of the density of both lungs which probably represents small airway disease. Upper Abdomen: Cholelithiasis, chronic. Musculoskeletal: No chest wall abnormality. No acute or significant osseous findings. Review of the MIP images confirms the above findings. IMPRESSION: 1. No pulmonary emboli. 2. Interval development of borderline cardiomegaly since 2012. 3. 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 4. New patchy atelectasis at the left lung base with new pleural calcifications on the left hemidiaphragm. 5. New nonspecific slight mediastinal and bilateral hilar adenopathy. This could be reactive. Does the patient have any history of malignancy? Electronically Signed   By: Lorriane Shire M.D.   On: 06/17/2018 15:17   Vas [place] Lower Extremity Venous (dvt) (only Mc & Wl 7a-7p)  Result Date: 06/18/2018  Lower Venous Study Indications: SOB, and blistering / drainage right shin.  Limitations: Body habitus. Performing Technologist: June Leap RDMS, RVT  Examination Guidelines: A complete evaluation includes B-mode imaging, spectral Doppler, color Doppler, and power Doppler as needed of all  accessible portions of each vessel. Bilateral testing is considered an integral part of a complete examination. Limited examinations for reoccurring indications may be performed as noted.  +---------+---------------+---------+-----------+----------+-------+ RIGHT    CompressibilityPhasicitySpontaneityPropertiesSummary +---------+---------------+---------+-----------+----------+-------+ CFV      Full           Yes      Yes                          +---------+---------------+---------+-----------+----------+-------+ SFJ      Full                                                 +---------+---------------+---------+-----------+----------+-------+ FV Prox  Full                                                 +---------+---------------+---------+-----------+----------+-------+  FV Mid   Full                                                 +---------+---------------+---------+-----------+----------+-------+ FV DistalFull                                                 +---------+---------------+---------+-----------+----------+-------+ PFV      Full                                                 +---------+---------------+---------+-----------+----------+-------+ POP      Full           Yes      Yes                          +---------+---------------+---------+-----------+----------+-------+ PTV      Full                                                 +---------+---------------+---------+-----------+----------+-------+ PERO     Full                                                 +---------+---------------+---------+-----------+----------+-------+   +----+---------------+---------+-----------+----------+--------------+ LEFTCompressibilityPhasicitySpontaneityPropertiesSummary        +----+---------------+---------+-----------+----------+--------------+ CFV                                              Not visualized  +----+---------------+---------+-----------+----------+--------------+     Summary: Right: There is no evidence of deep vein thrombosis in the lower extremity. No cystic structure found in the popliteal fossa.  *See table(s) above for measurements and observations. Electronically signed by Ruta Hinds MD on 06/18/2018 at 2:36:42 AM.    Final     Cardiac Studies   TTE: pending  Patient Profile     63 y.o. male with CAD with CABG 2002 and STEMI 2015, with graft failure and DES to LCX, PAF recurrent PE/DVT, HTN, HLD, DM and OSA no longer wearing CPAP,  With amiodarone and BB he had MOBITZ 2 and BB stopped, PAD with PCI of L mid to distal Ant tib, 2018.  Also Lt [facility]KA at [facility]. Presented with Rt lower ext edema and drainage  Assessment & Plan    1. Acute CHF with Rt lower ext edema and hx LBKA:  Pt had increased his lasix to 160 mg from 80 mg daily off and on for last 7 days prior to admission.   -- currently on 80mg  IV lasix BID. Net - 2L, weight trending down. Renal function stable. Would continue with diuretic.   2. Bradycardia with hx of Mobitz 2: BB stopped on admission.   3. A flutter with hx  of a fib transiently in 2015: HR stable, remains in aflutter. Planned outlined by Dr. Sallyanne Kuster for DCCV to restore SR. Discuss with MD regarding timing, possibly in the am.  4. CAD with hx CABG: Stents since with last cath 2015 patent LIMA to LAD, patent VG to OM, occluded VG to diag. Occluded VG to PDA, patent stents in native RCA and successful stenting of the mid to distal native LCx with DES.    5. Hx of recurrent PE/DVT: on coumadin and neg PE and DVT this admit. Coumadin dosing per PharmD. INR 2.3  6. Leg wounds: wound RN following.  7. HTN: stable  8. HLD:  continue statin last statin 04/2018 was 63.  9. DM: Hgb A1c 10.1. On SSI. Management per primary  For questions or updates, please contact [place] Please consult [email or web address] for contact info under   Signed, Reino Bellis,  NP  06/18/2018, 9:13 AM     Agree with note by Reino Bellis NP-C  Bobby Lindsey was admitted with lower extremity edema and some weeping of his right leg.  He has diuresed 2 L with IV Lasix.  His INR is have remained therapeutic.  He is in a flutter with a ventricular spots in the 40s.  2D echo was ordered but has not yet been resulted.  His exam is benign.  Lungs are clear.  His INR is 2.3.  He is scheduled for DC cardioversion tomorrow.  His BNP was low at 307 and his enzymes were negative.  Lorretta Harp, M.D., [place], [facility], [place] [address] [place], [place]  [address]  [phone] 06/18/2018 11:26 AM

## 2018-06-18 NOTE — Progress Notes (Signed)
  Echocardiogram 2D Echocardiogram has been performed.  Jennette Dubin 06/18/2018, 9:23 AM

## 2018-06-18 NOTE — Consult Note (Signed)
[facility] Nurse wound consult note Reason for Consult:Edema to Right lower extremity with bullae, erythema and tenderness.  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 Ultrasound of right lower extremity is negative for DVT.   History Diabetes.  Wound type:inflammatory, infectious Pressure Injury POA:/NA Measurement:Scattered bullae to anterior right lower leg.  Serum filled.  Wound ITJ:LLVDIX blisters Drainage (amount, consistency, odor) weeping to right lower leg Periwound:erythema and edema Dressing procedure/placement/frequency:Cleanse right lower leg with soap and water and pat dry.  Apply Xeroform to blistering.  Secure with kerlix and tape.  Change daily.  Will not follow at this time.  Please re-consult if needed.  Domenic Moras MSN, RN, FNP-BC [facility]ON Wound, Ostomy, Continence Nurse Pager [phone]

## 2018-06-18 NOTE — Progress Notes (Signed)
Date: 06/18/2018  Patient name: Bobby Lindsey  Medical record number: [id number]  Date of birth: [birth year]   I have seen and evaluated Bobby Lindsey and discussed their care with the Residency Team.  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  He presents with 1 week of worsening dyspnea on exertion and orthopnea.  On the night prior to admission, he had chest pain which lasted only a few seconds.  He also noticed 1 day of right lower extremity redness, swelling, and blisters.  He tried to treat his symptoms with an increased home dose of Lasix without any improvement in his breathing.  Vitals:   06/18/18 0900 06/18/18 1128  BP:  (!) 153/87  Pulse:  (!) 46  Resp: 18 19  Temp: 97.8 F (36.6 C) 97.7 F (36.5 C)  SpO2:  92%  Obese man lying in bed in no acute distress.  Able to speak in full sentences. Heart bradycardic no murmurs Lungs Rales in the bases bilaterally Extremity right shows changes consistent with chronic venous insufficiency and also fluid-filled blisters over the pretibial area. Neuro moves all 4 extremities.  Alert.  Fasciculations right medial thigh just proximal to the knee.  Sodium 138 Creatinine 1.28 - 1.10 BNP 307 Trop <0.03 INR 2.3 A1c 10.1  Echo completed today showed an EF of 60 to 65% with mild dilation of the left ventricle.  He has intermediate diastolic dysfunction.  Right ventricle is normal with the exception of an elevated systolic pressure of 56.2.  I personally viewed the CXR images and confirmed my reading with the official read.  Two-view PA and lateral, good quality.  Sternal wires are present, streaky opacity left base which is chronic.  I personally viewed the EKG and confirmed my reading with the official read.  A flutter with a ventricular rate in the 40s.  Normal axis.  No ischemic changes.  Assessment and Plan: I have seen  and evaluated the patient as outlined above. 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  He presented with subacute on chronic respiratory failure and right leg erythema, edema, and blisters.  He was found to have significant bradycardia ranging from the high 30s to the low 50s.  He was ruled out for an ACS, TSH returned normal, COVID returned negative, and an echo showed no significant abnormalities compared to his echo in October 2018.  It was felt that he had volume overload based on his symptoms and edema seen on a CT scan.  His BNP and dry weight argues against volume overload.  Volume overload was thought to be due to his bradycardia along with new onset a flutter.  The bradycardia etiology is not certain but he is on a beta-blocker at home and has a history of Mobitz type II treated with amiodarone and a beta-blocker.  Risk factors for his a flutter include his sex, age, and known CAD.  Cardiology feels that the atrial arrhythmia triggered the heart failure due to the mild volume overload.  They plan for DC cardioversion while inpatient.  He has been diuresed with improvement in his symptoms.    1.  New onset a flutter with AV block - DC cardioversion per cardiology  2.  Bradycardia - BB held on admission.  3.  Acute heart failure with a normal EF - this is thought to be a result of his a flutter.  His volume overload symptoms are responding well to IV diuresis.  4.  Right lower extremity cellulitis - today, after receiving antibiotics IV yesterday, his leg has the appearance of chronic venous insufficiency.  The admitting team he was able to see him before antibiotics felt that the initial presentation was more concerning for a bacterial cellulitis and that he did make  improvement in his leg appearance since admission.  Therefore, we will continue with oral antibiotics for a total course of 5 days.  5.  Poorly controlled insulin-dependent type 2 diabetes - cont Toujeo, Humalog, and metformin.  He will need outpatient management as his A1c is 10.  6.  OSA not compliant with CPAP - he will need outpatient management as untreated OSA can definitely trigger arrhythmias and heart failure exacerbations.  Bartholomew Crews, MD 5/5/20201:59 PM

## 2018-06-19 ENCOUNTER — Inpatient Hospital Stay (HOSPITAL_COMMUNITY): Payer: Managed Care, Other (non HMO) | Admitting: Anesthesiology

## 2018-06-19 ENCOUNTER — Encounter (HOSPITAL_COMMUNITY)
Admission: EM | Disposition: A | Discharge status: Home / Self Care | Payer: Self-pay | Source: Home / Self Care | Attending: Internal Medicine

## 2018-06-19 DIAGNOSIS — I4430 Unspecified atrioventricular block: Secondary | ICD-10-CM

## 2018-06-19 DIAGNOSIS — I10 Essential (primary) hypertension: Secondary | ICD-10-CM

## 2018-06-19 DIAGNOSIS — Z7189 Other specified counseling: Secondary | ICD-10-CM

## 2018-06-19 HISTORY — PX: CARDIOVERSION: SHX1299

## 2018-06-19 LAB — BASIC METABOLIC PANEL
Anion gap: 15 (ref 5–15)
BUN: 24 mg/dL — ABNORMAL HIGH (ref 8–23)
CO2: 26 mmol/L (ref 22–32)
Calcium: 9.4 mg/dL (ref 8.9–10.3)
Chloride: 98 mmol/L (ref 98–111)
Creatinine, Ser: 1.17 mg/dL (ref 0.61–1.24)
GFR calc Af Amer: >60 mL/min (ref >60)
GFR calc non Af Amer: >60 mL/min (ref >60)
Glucose, Bld: 159 mg/dL — ABNORMAL HIGH (ref 70–99)
Potassium: 3.6 mmol/L (ref 3.5–5.1)
Sodium: 139 mmol/L (ref 135–145)

## 2018-06-19 LAB — GLUCOSE, CAPILLARY
Glucose-Capillary: 146 mg/dL — ABNORMAL HIGH (ref 70–99)
Glucose-Capillary: 166 mg/dL — ABNORMAL HIGH (ref 70–99)
Glucose-Capillary: 231 mg/dL — ABNORMAL HIGH (ref 70–99)
Glucose-Capillary: 270 mg/dL — ABNORMAL HIGH (ref 70–99)

## 2018-06-19 LAB — PROTIME-INR
INR: 2.4 — ABNORMAL HIGH (ref 0.8–1.2)
Prothrombin Time: 25.6 seconds — ABNORMAL HIGH (ref 11.4–15.2)

## 2018-06-19 LAB — MAGNESIUM: Magnesium: 2 mg/dL (ref 1.7–2.4)

## 2018-06-19 SURGERY — CARDIOVERSION
Anesthesia: General

## 2018-06-19 MED ORDER — INSULIN GLARGINE 100 UNIT/ML ~~LOC~~ SOLN
30.0000 [IU] | Freq: Every day | SUBCUTANEOUS | Status: DC
  Administered 2018-06-19 – 2018-06-20 (×2): 30 [IU] via SUBCUTANEOUS
  Filled 2018-06-19 (×3): qty 0.3

## 2018-06-19 MED ORDER — SODIUM CHLORIDE 0.9 % IV SOLN
INTRAVENOUS | Status: DC
  Administered 2018-06-19: 13:00:00 via INTRAVENOUS

## 2018-06-19 MED ORDER — POTASSIUM CHLORIDE CRYS ER 20 MEQ PO TBCR
40.0000 meq | EXTENDED_RELEASE_TABLET | Freq: Once | ORAL | Status: AC
  Administered 2018-06-19: 40 meq via ORAL
  Filled 2018-06-19: qty 2

## 2018-06-19 MED ORDER — LIDOCAINE 2% (20 MG/ML) 5 ML SYRINGE
INTRAMUSCULAR | Status: DC | PRN
  Administered 2018-06-19: 100 mg via INTRAVENOUS

## 2018-06-19 MED ORDER — PROPOFOL 10 MG/ML IV BOLUS
INTRAVENOUS | Status: DC | PRN
  Administered 2018-06-19: 80 mg via INTRAVENOUS

## 2018-06-19 MED ORDER — INSULIN GLARGINE 100 UNIT/ML ~~LOC~~ SOLN
30.0000 [IU] | Freq: Every day | SUBCUTANEOUS | Status: DC
  Administered 2018-06-19: 30 [IU] via SUBCUTANEOUS
  Filled 2018-06-19 (×2): qty 0.3

## 2018-06-19 MED ORDER — WARFARIN SODIUM 10 MG PO TABS
10.0000 mg | ORAL_TABLET | Freq: Once | ORAL | Status: AC
  Administered 2018-06-19: 10 mg via ORAL
  Filled 2018-06-19: qty 1

## 2018-06-19 NOTE — Discharge Instructions (Signed)
Carbohydrate Counting for People with Diabetes  Why Is Carbohydrate Counting Important? Counting carbohydrate servings may help you control your blood glucose level so that you feel better. The balance between the carbohydrates you eat and insulin determines what your blood glucose level will be after eating. Carbohydrate counting can also help you plan your meals.  Which Foods Have Carbohydrates? Foods with carbohydrates include: Breads, crackers, and cereals Pasta, rice, and grains Starchy vegetables, such as potatoes, corn, and peas Beans and legumes Milk, soy milk, and yogurt Fruits and fruit juices Sweets, such as cakes, cookies, ice cream, jam, and jelly  Carbohydrate Servings In diabetes meal planning, 1 serving of a food with carbohydrate has about 15 grams of carbohydrate: Check serving sizes with measuring cups and spoons or a food scale. Read the Nutrition Facts on food labels to find out how many grams of carbohydrate are in foods you eat. The food lists in this handout show portions that have about 15 grams of carbohydrate.   Tips Meal Planning Tips An Eating Plan tells you how many carbohydrate servings to eat at your meals and snacks. For many adults, eating 3 to 5 servings of carbohydrate foods at each meal and 1 or 2 carbohydrate servings for each snack works well. In a healthy daily Eating Plan, most carbohydrates come from: At least 6 servings of fruits and nonstarchy vegetables At least 6 servings of grains, beans, and starchy vegetables, with at least 3 servings from whole grains At least 2 servings of milk or milk products Check your blood glucose level regularly. It can tell you if you need to adjust when you eat carbohydrates. Eating foods that have fiber, such as whole grains, and having very few salty foods is good for your health. Eat 4 to 6 ounces of meat or other protein foods (such as soybean burgers) each day. Choose low-fat sources of protein, such  as lean beef, lean pork, chicken, fish, low-fat cheese, or vegetarian foods such as soy. Eat some healthy fats, such as olive oil, canola oil, and nuts. Eat very little saturated fats. These unhealthy fats are found in butter, cream, and high-fat meats, such as bacon and sausage. Eat very little or no trans fats. These unhealthy fats are found in all foods that list partially hydrogenated oil as an Ingredient.  Label Reading Tips The Nutrition Facts panel on a label lists the grams of total carbohydrate in 1 standard serving. The labels standard serving may be larger or smaller than 1 carbohydrate serving. To figure out how many carbohydrate servings are in the food: First, look at the labels standard serving size. Check the grams of total carbohydrate. This is the amount of carbohydrate in 1 standard serving. Divide the grams of total carbohydrate by 15. This number equals the number of carbohydrate servings in 1 standard serving. Remember: 1 carbohydrate serving is 15 grams of carbohydrate. Note: You may ignore the grams of sugars on the Nutrition Facts panel because they are included in the grams of total carbohydrate.  Foods Recommended 1 serving = about 15 grams of carbohydrate Starches 1 slice bread (1 ounce) 1 tortilla (6-inch size)  large bagel (1 ounce) 2 taco shells (5-inch size)  hamburger or hot dog bun ( ounce)  cup ready-to-eat unsweetened cereal  cup cooked cereal 1 cup broth-based soup 4 to 6 small crackers 1/3 cup pasta or rice (cooked)  cup beans, peas, corn, sweet potatoes, winter squash, or mashed or boiled potatoes (cooked)  large baked potato (  3 ounces)  ounce pretzels, potato chips, or tortilla chips 3 cups popcorn (popped) Fruit 1 small fresh fruit ( to 1 cup)  cup canned or frozen fruit 2 tablespoons dried fruit (blueberries, cherries, cranberries, mixed fruit, raisins) 17 small grapes (3 ounces) 1 cup melon or berries  cup unsweetened  fruit juice Milk 1 cup fat-free or reduced-fat milk 1 cup soy milk 2/3 cup (6 ounces) nonfat yogurt sweetened with sugar-free sweetener Copyright Academy of Nutrition and Dietetics. This handout may be duplicated for client education. Page 4/7 Sweets and Desserts 2-inch square cake (unfrosted) 2 small cookies (2/3 ounce)  cup ice cream or frozen yogurt  cup sherbet or sorbet 1 tablespoon syrup, jam, jelly, table sugar, or honey 2 tablespoons light syrup Other Foods Count 1 cup raw vegetables or  cup cooked nonstarchy vegetables as zero (0) carbohydrate servings or free foods. If you eat 3 or more servings at one meal, count them as 1 carbohydrate serving. Foods that have less than 20 calories in each serving also may be counted as zero carbohydrate servings or free foods. Count 1 cup of casserole or other mixed foods as 2 carbohydrate servings.  Carbohydrate Counting for People with Diabetes Sample 1-Day Menu Breakfast 1 extra-small banana (1 carbohydrate serving) 3/4 cup corn flakes (1 carbohydrate serving) 1 cup low-fat or fat-free milk (1 carbohydrate serving) 1 slice whole wheat bread (1 carbohydrate serving) 1 teaspoon margarine Lunch 2 ounces [place] slices 2 slices whole wheat bread (2 carbohydrate servings) 2 lettuce leaves 4 celery sticks 4 carrot sticks 1 medium apple (1 carbohydrate serving) 1 cup low-fat or fat-free milk (1 carbohydrate serving) Afternoon Snack 2 tablespoons raisins (1 carbohydrate serving) 3/4 ounce unsalted mini pretzels (1 carbohydrate serving) Evening Meal 3 ounces lean roast beef 1/2 large baked potato (2 carbohydrate servings) 1 tablespoon reduced-fat sour cream 1/2 cup green beans 1 cup vegetable salad 1 tablespoon light salad dressing 1 whole wheat dinner roll (1 carbohydrate serving) 1 teaspoon margarine 1 cup melon balls (1 carbohydrate serving) Evening Snack 6 ounces low-fat sugar-free fruit yogurt (1 carbohydrate  serving) 2 tablespoons unsalted nuts  Carbohydrate Counting for People with Diabetes Vegan Sample 1-Day Menu Breakfast 1 cup cooked oatmeal (2 carbohydrate servings)  cup blueberries (1 carbohydrate serving) 2 tablespoons flaxseeds 1 cup soymilk fortified with calcium and vitamin D 1 cup coffee Lunch 2 slices whole wheat bread (2 carbohydrate servings)  cup baked tofu  cup lettuce 2 slices tomato 2 slices avocado  cup baby carrots 1 orange (1 carbohydrate serving) 1 cup soymilk fortified with calcium and vitamin D Evening Meal Burrito made with: 1 6-inch corn tortilla (1 carbohydrate serving) 1 cup refried vegetarian beans (1 carbohydrate serving)  cup chopped tomatoes  cup lettuce  cup salsa 1/3 cup brown rice (1 carbohydrate serving) 1 tablespoon olive oil for rice  cup zucchini Evening Snack 6 small whole grain crackers (1 carbohydrate serving) 2 apricots ( carbohydrate serving)  cup unsalted peanuts ( carbohydrate serving)  Carbohydrate Counting for People with Diabetes Vegetarian (Lacto-Ovo) Sample 1-Day Menu Breakfast 1 cup cooked oatmeal (2 carbohydrate servings)  cup blueberries (1 carbohydrate serving) 2 tablespoons flaxseeds 1 egg 1 cup 1% milk (1 carbohydrate serving) 1 cup coffee Lunch 2 slices whole wheat bread (2 carbohydrate servings) 2 ounces low-fat cheese  cup lettuce 2 slices tomato 2 slices avocado  cup baby carrots 1 orange (1 carbohydrate serving) 1 cup unsweetened tea Evening Meal Burrito made with: 1 6-inch corn tortilla (1 carbohydrate serving)  cup refried vegetarian beans (1 carbohydrate serving)  cup tomatoes  cup lettuce  cup salsa 1/3 cup brown rice (1 carbohydrate serving) 1 tablespoon olive oil for rice  cup zucchini 1 cup 1% milk (1 carbohydrate serving) Evening Snack 6 small whole grain crackers (1 carbohydrate serving) 2 apricots ( carbohydrate serving)  cup unsalted peanuts ( carbohydrate  serving)

## 2018-06-19 NOTE — Progress Notes (Signed)
   Subjective: Denies sob, feeling hungry. Denies chest pain or nausea. States he slept well last night. Discussed plan for cardioversion at noon today. His leg swelling and redness is better.   Objective:  Vital signs in last 24 hours: Vitals:   06/18/18 2022 06/19/18 0013 06/19/18 0449 06/19/18 0500  BP: (!) 155/74 128/73 (!) 151/78   Pulse: (!) 52 (!) 55 (!) 56   Resp: 18 16 17 20   Temp: 98.3 F (36.8 C) 98.1 F (36.7 C) 97.7 F (36.5 C)   TempSrc: Oral Oral Oral   SpO2: 95% 95% 91%   Weight:    114.8 kg  Height:       Constitution: NAD, sitting up in bed  Cardio: irregular rate & rhythm, no m/r/g  Respiratory: CTA, no wheezing rales rhonchi  MSK: no LE edema, RLE BKA  Neuro: a&o, normal affect Skin: anterior right shin +erythema from yesterday, bullae present  Assessment/Plan:  Active Problems:   Bradycardia   Cellulitis of right lower extremity   Atypical atrial flutter ([place])  63yo male with PMH CAD with CABG 2002 and RCA DES, recurrent PE/DVT on coumadin, STEMI 2015, HTN, HLD, TIIDM s/p BKA 2018 and OSA presenting with worsening dyspnea on exertion, orthopneafor 8 days, and RLE redness and skin changesthat he first noticed the day before admission.   Atrial Flutter with AV Block and Ventricular Bradycardia New Onset Dyspnea ECHO showed EF 60-65%, mild dilation of L ventricle, and increase right ventricular pressure. Diastolic function could not be determined. Plan is for cardioversion today. He has been receiving 80 mg IV lasix bid and is net -3.5 L. Appears euvolemic on exam.   - cardiology consult, appreciate recommendations  - f/u post-procedure EKG - cardioversion today  - diuresis per cardiology  - will replete K - cont. lipitor  RLE Cellulitis Seen by wound care yesterday. Recommending xeroform with kerlix wrap and daily change.   - cont. Cephalexin day 3/5  TIIDM Glucose remains elevated. Takes 70U tujeo qam, 36U humalog TID at home, metformin 500  XR qd  - increase lantus to 30U bid - cont. SSI resistant TID and 5U tid with meals - cont. SSI qhs  VTE: warfarin IVF: none  Diet: NPO until post-procedure Code: full   Dispo: Anticipated discharge today or tomorrow.   Molli Hazard A, DO 06/19/2018, 6:05 AM Pager: [phone]

## 2018-06-19 NOTE — Anesthesia Procedure Notes (Signed)
Procedure Name: MAC Date/Time: 06/19/2018 1:03 PM Performed by: Marsa Aris, CRNA Pre-anesthesia Checklist: Patient identified, Emergency Drugs available, Suction available, Patient being monitored and Timeout performed Patient Re-evaluated:Patient Re-evaluated prior to induction Oxygen Delivery Method: Ambu bag Preoxygenation: Pre-oxygenation with 100% oxygen Induction Type: IV induction

## 2018-06-19 NOTE — CV Procedure (Signed)
    Cardioversion Note  Bobby Lindsey [id number] Jun 24, 1955  Procedure: DC Cardioversion Indications: atrial fib   Procedure Details Consent: Obtained Time Out: Verified patient identification, verified procedure, site/side was marked, verified correct patient position, special equipment/implants available, Radiology Safety Procedures followed,  medications/allergies/relevent history reviewed, required imaging and test results available.  Performed  The patient has been on adequate anticoagulation.  The patient received IV Lidocaine 100 mg followed by Propofol 80 mg Iv  for sedation.  Synchronous cardioversion was performed at 200  joules.  The cardioversion was successful .     Complications: No apparent complications Patient did tolerate procedure well.   Thayer Headings, Brooke Bonito., MD, [facility] 06/19/2018, 1:07 PM

## 2018-06-19 NOTE — Anesthesia Postprocedure Evaluation (Signed)
Anesthesia Post Note  Patient: Bobby Lindsey  Procedure(s) Performed: CARDIOVERSION (N/A )     Patient location during evaluation: Endoscopy Anesthesia Type: General Level of consciousness: awake and alert Pain management: pain level controlled Vital Signs Assessment: post-procedure vital signs reviewed and stable Respiratory status: spontaneous breathing, nonlabored ventilation, respiratory function stable and patient connected to nasal cannula oxygen Cardiovascular status: blood pressure returned to baseline and stable Postop Assessment: no apparent nausea or vomiting Anesthetic complications: no    Last Vitals:  Vitals:   06/19/18 1400 06/19/18 1954  BP: (!) 141/48   Pulse: 62   Resp:    Temp:  36.8 C  SpO2:  92%    Last Pain:  Vitals:   06/19/18 1954  TempSrc: Oral  PainSc:                  Lorayne Getchell L Siana Panameno

## 2018-06-19 NOTE — Progress Notes (Signed)
  Date: 06/19/2018  Patient name: JAHMARI ESBENSHADE  Medical record number: [id number]  Date of birth: 05/07/1955   I have seen and evaluated this patient and I have discussed the plan of care with the house staff. Please see their note for complete details. I concur with their findings with the following additions/corrections: Mr. Leighton was seen this morning on team rounds.  He is scheduled for DC cardioversion today.  His right lower extremity has less erythema compared to yesterday and 1 of them bullae has opened.  The improved erythema simply could be due to better volume status but he has also been on an antibiotic arguing that there may have been a bacterial infection responding to the antibiotics.  We will complete a total of 5 days of oral antibiotics.  Bartholomew Crews, MD 06/19/2018, 12:14 PM

## 2018-06-19 NOTE — Progress Notes (Signed)
Nutrition Brief Note  RD consulted to provide further diabetes diet education.   Of note: diabetes coordination discussed "carbohydrates, carbohydrate goals per day and meal, along with portion sizes" on 5/5.   Attempted to speak with pt via phone as RD is working remotely due to [place] precautions. Pt reports he doesn't feel up to talking at the moment. RD asked if he would like for [place] to call back, pt states "I guess, if I feel like talking." RD to attempt to call back if possible.   Will provide DM handout from the Academy of Nutrition and Dietetics in discharge instructions.   Mariana Single RD, LDN Clinical Nutrition Pager # [phone]

## 2018-06-19 NOTE — Progress Notes (Signed)
Bobby Lindsey for warfarin Indication: recurrent PE/DVT, PAF  Patient Measurements: Height: 6' (182.9 cm) Weight: 253 lb 1.4 oz (114.8 kg) IBW/kg (Calculated) : 77.6  Vital Signs: Temp: 97.8 F (36.6 C) (05/06 1311) Temp Source: Oral (05/06 1311) BP: 127/77 (05/06 1330) Pulse Rate: 60 (05/06 1338)  Labs: Recent Labs    06/17/18 1202 06/17/18 1327 06/17/18 2142 06/18/18 0324 06/19/18 0307  HGB 15.0  --   --  15.3  --   HCT 45.8  --   --  46.0  --   PLT 196  --   --  182  --   LABPROT  --  26.0*  --  25.2* 25.6*  INR  --  2.4*  --  2.3* 2.4*  CREATININE 1.28*  --   --  1.10 1.17  TROPONINI  --  <0.03 <0.03  --   --     Assessment: 63 y/o male admitted with increased SOB and R leg swelling. He takes warfarin PTA for PAF and hx VTE. INR is therapeutic at 2.4. No bleeding noted, CBC is normal. CTA chest neg for PE, RLE duplex neg for DVT. Current INR is therapeutic 2.4.  PTA: 10 mg daily except 5 mg Mon/Fri per OP clinic visit 4/24, last taken 5/3  Goal of Therapy:  INR 2-3 Monitor platelets by anticoagulation protocol: Yes   Plan:  Warfarin 10 mg PO x1 Daily INR    Harvel Quale 06/19/2018 1:59 PM

## 2018-06-19 NOTE — Anesthesia Preprocedure Evaluation (Addendum)
Anesthesia Evaluation  Patient identified by MRN, date of birth, ID band Patient awake    Reviewed: Allergy & Precautions, NPO status , Patient's Chart, lab work & pertinent test results  Airway Mallampati: III  TM Distance: >3 FB Neck ROM: Full    Dental no notable dental hx. (+) Teeth Intact, Dental Advisory Given   Pulmonary sleep apnea , COPD, PE   Pulmonary exam normal breath sounds clear to auscultation       Cardiovascular hypertension, + CAD, + Past MI, + Peripheral Vascular Disease, +CHF and + DVT  Normal cardiovascular exam+ dysrhythmias Atrial Fibrillation  Rhythm:Irregular Rate:Normal  TTE 2020 EF 60-65%, no valvular abnormalities   Neuro/Psych PSYCHIATRIC DISORDERS Depression negative neurological ROS     GI/Hepatic Neg liver ROS, hiatal hernia, GERD  ,  Endo/Other  negative endocrine ROSdiabetes, Type 2  Renal/GU negative Renal ROS  negative genitourinary   Musculoskeletal negative musculoskeletal ROS (+)   Abdominal   Peds negative pediatric ROS (+)  Hematology negative hematology ROS (+)   Anesthesia Other Findings On coumadin  Reproductive/Obstetrics negative OB ROS                            Anesthesia Physical Anesthesia Plan  ASA: III  Anesthesia Plan: General   Post-op Pain Management:    Induction: Intravenous  PONV Risk Score and Plan: 2 and Propofol infusion and Treatment may vary due to age or medical condition  Airway Management Planned: Natural Airway and Simple Face Mask  Additional Equipment:   Intra-op Plan:   Post-operative Plan:   Informed Consent: I have reviewed the patients History and Physical, chart, labs and discussed the procedure including the risks, benefits and alternatives for the proposed anesthesia with the patient or authorized representative who has indicated his/her understanding and acceptance.     Dental advisory  given  Plan Discussed with: CRNA  Anesthesia Plan Comments:         Anesthesia Quick Evaluation

## 2018-06-19 NOTE — Transfer of Care (Signed)
Immediate Anesthesia Transfer of Care Note  Patient: Bobby Lindsey  Procedure(s) Performed: CARDIOVERSION (N/A )  Patient Location: Endoscopy Unit  Anesthesia Type:MAC  Level of Consciousness: awake, alert  and oriented  Airway & Oxygen Therapy: Patient Spontanous Breathing  Post-op Assessment: Report given to RN and Post -op Vital signs reviewed and stable  Post vital signs: Reviewed and stable  Last Vitals:  Vitals Value Taken Time  BP 161/76   Temp 97.6   Pulse 63   Resp 16   SpO2 96     Last Pain:  Vitals:   06/19/18 1144  TempSrc: Oral  PainSc: 0-No pain         Complications: No apparent anesthesia complications

## 2018-06-19 NOTE — Interval H&P Note (Signed)
History and Physical Interval Note:  06/19/2018 8:11 AM  Bobby Lindsey  has presented today for surgery, with the diagnosis of afib.  The various methods of treatment have been discussed with the patient and family. After consideration of risks, benefits and other options for treatment, the patient has consented to  Procedure(s): CARDIOVERSION (N/A) as a surgical intervention.  The patient's history has been reviewed, patient examined, no change in status, stable for surgery.  I have reviewed the patient's chart and labs.  Questions were answered to the patient's satisfaction.     Mertie Moores

## 2018-06-19 NOTE — Progress Notes (Addendum)
Progress Note  Patient Name: Bobby Lindsey Date of Encounter: 06/19/2018  Primary Cardiologist: Minus Breeding, MD  Subjective   Planned for cardioversion today.  Inpatient Medications    Scheduled Meds:  atorvastatin  20 mg Oral Daily   cephALEXin  500 mg Oral Q12H   furosemide  80 mg Intravenous BID   insulin aspart  0-20 Units Subcutaneous TID WC   insulin aspart  5 Units Subcutaneous TID WC   insulin glargine  30 Units Subcutaneous QHS   insulin glargine  30 Units Subcutaneous Q breakfast   ramelteon  8 mg Oral Once   sodium chloride flush  3 mL Intravenous Once   Warfarin - Pharmacist Dosing Inpatient   Does not apply q1800   Continuous Infusions:  PRN Meds: acetaminophen **OR** acetaminophen, gabapentin, polyethylene glycol   Vital Signs    Vitals:   06/19/18 0013 06/19/18 0449 06/19/18 0500 06/19/18 0756  BP: 128/73 (!) 151/78  (!) 141/60  Pulse: (!) 55 (!) 56  (!) 52  Resp: 16 17 20 13   Temp: 98.1 F (36.7 C) 97.7 F (36.5 C)  98.5 F (36.9 C)  TempSrc: Oral Oral  Oral  SpO2: 95% 91%  95%  Weight:   114.8 kg   Height:        Intake/Output Summary (Last 24 hours) at 06/19/2018 4825 Last data filed at 06/19/2018 0017 Gross per 24 hour  Intake 480 ml  Output 2050 ml  Net -1570 ml   Last 3 Weights 06/19/2018 06/18/2018 06/17/2018  Weight (lbs) 253 lb 1.4 oz 257 lb 8 oz 268 lb 1.3 oz  Weight (kg) 114.8 kg 116.8 kg 121.6 kg      Telemetry    Atrial flutter- Personally Reviewed  ECG    None today- Personally Reviewed  Physical Exam   GEN: No acute distress.   Neck: No JVD Cardiac: RRR, no murmurs, rubs, or gallops.  Respiratory: Clear to auscultation bilaterally. GI: Soft, nontender, non-distended  MS: No edema; No deformity. Neuro:  Nonfocal  Psych: Normal affect   Labs    Chemistry Recent Labs  Lab 06/17/18 1202 06/18/18 0324 06/19/18 0307  NA 137 138 139  K 4.4 3.5 3.6  CL 99 102 98  CO2 27 24 26   GLUCOSE 279* 205* 159*   BUN 20 20 24*  CREATININE 1.28* 1.10 1.17  CALCIUM 9.2 9.2 9.4  PROT  --  7.0  --   ALBUMIN  --  3.5  --   AST  --  18  --   ALT  --  29  --   ALKPHOS  --  73  --   BILITOT  --  1.3*  --   GFRNONAA 59* >60 >60  GFRAA >60 >60 >60  ANIONGAP 11 12 15      Hematology Recent Labs  Lab 06/17/18 1202 06/18/18 0324  WBC 9.9 9.4  RBC 5.28 5.40  HGB 15.0 15.3  HCT 45.8 46.0  MCV 86.7 85.2  MCH 28.4 28.3  MCHC 32.8 33.3  RDW 14.5 14.4  PLT 196 182    Cardiac Enzymes Recent Labs  Lab 06/17/18 1327 06/17/18 2142  TROPONINI <0.03 <0.03   No results for input(s): TROPIPOC in the last 168 hours.   BNP Recent Labs  Lab 06/17/18 1327  BNP 307.1*     DDimer No results for input(s): DDIMER in the last 168 hours.   Radiology    Dg Chest 2 View  Result Date: 06/17/2018 CLINICAL  DATA:  Shortness of breath. EXAM: CHEST - 2 VIEW COMPARISON:  Radiographs of November 23, 2016. FINDINGS: Stable cardiomegaly. Status post coronary bypass graft. No pneumothorax is noted. No pleural effusion is noted. Stable left basilar opacities are noted most consistent with scarring or subsegmental atelectasis with associated pleural thickening. Right lung is clear. Bony thorax is unremarkable. IMPRESSION: Stable left basilar opacities are noted most consistent with scarring or subsegmental atelectasis with associated pleural thickening. Electronically Signed   By: Marijo Conception M.D.   On: 06/17/2018 13:01   Ct Angio Chest Pe W/cm &/or Wo Cm  Result Date: 06/17/2018 CLINICAL DATA:  Chest pain and shortness of breath. EXAM: CT ANGIOGRAPHY CHEST WITH CONTRAST TECHNIQUE: Multidetector CT imaging of the chest was performed using the standard protocol during bolus administration of intravenous contrast. Multiplanar CT image reconstructions and MIPs were obtained to evaluate the vascular anatomy. CONTRAST:  142mL OMNIPAQUE IOHEXOL 350 MG/ML SOLN COMPARISON:  Chest x-ray dated 06/17/2018 and chest CT dated  04/18/2010 FINDINGS: Cardiovascular: No pulmonary emboli. There is pulmonary vascular congestion. There is new borderline cardiomegaly since 2012. CABG. No pericardial effusion. Mediastinum/Nodes: The patient has developed slight bilateral hilar and mediastinal adenopathy, slightly more prominent on the right than the left. This is nonspecific. Lungs/Pleura: There are focal areas of atelectasis at the left lung base. There is pleural calcification on the left hemidiaphragm. There is adjacent pleural thickening. There is tracheobronchomalacia, new since the prior study. There is a patchy mosaic appearance of the density of both lungs which probably represents small airway disease. Upper Abdomen: Cholelithiasis, chronic. Musculoskeletal: No chest wall abnormality. No acute or significant osseous findings. Review of the MIP images confirms the above findings. IMPRESSION: 1. No pulmonary emboli. 2. Interval development of borderline cardiomegaly since 2012. 3. 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 4. New patchy atelectasis at the left lung base with new pleural calcifications on the left hemidiaphragm. 5. New nonspecific slight mediastinal and bilateral hilar adenopathy. This could be reactive. Does the patient have any history of malignancy? Electronically Signed   By: Lorriane Shire M.D.   On: 06/17/2018 15:17   Vas [place] Lower Extremity Venous (dvt) (only Mc & Wl 7a-7p)  Result Date: 06/18/2018  Lower Venous Study Indications: SOB, and blistering / drainage right shin.  Limitations: Body habitus. Performing Technologist: June Leap RDMS, RVT  Examination Guidelines: A complete evaluation includes B-mode imaging, spectral Doppler, color Doppler, and power Doppler as needed of all accessible portions of each vessel. Bilateral testing is considered an integral part of a complete examination. Limited examinations for reoccurring indications may be  performed as noted.  +---------+---------------+---------+-----------+----------+-------+  RIGHT     Compressibility Phasicity Spontaneity Properties Summary  +---------+---------------+---------+-----------+----------+-------+  CFV       Full            Yes       Yes                             +---------+---------------+---------+-----------+----------+-------+  SFJ       Full                                                      +---------+---------------+---------+-----------+----------+-------+  FV Prox  Full                                                      +---------+---------------+---------+-----------+----------+-------+  FV Mid    Full                                                      +---------+---------------+---------+-----------+----------+-------+  FV Distal Full                                                      +---------+---------------+---------+-----------+----------+-------+  PFV       Full                                                      +---------+---------------+---------+-----------+----------+-------+  POP       Full            Yes       Yes                             +---------+---------------+---------+-----------+----------+-------+  PTV       Full                                                      +---------+---------------+---------+-----------+----------+-------+  PERO      Full                                                      +---------+---------------+---------+-----------+----------+-------+   +----+---------------+---------+-----------+----------+--------------+  LEFT Compressibility Phasicity Spontaneity Properties Summary         +----+---------------+---------+-----------+----------+--------------+  CFV                                                   Not visualized  +----+---------------+---------+-----------+----------+--------------+     Summary: Right: There is no evidence of deep vein thrombosis in the lower extremity. No cystic structure found in the  popliteal fossa.  *See table(s) above for measurements and observations. Electronically signed by Ruta Hinds MD on 06/18/2018 at 2:36:42 AM.    Final     Cardiac Studies   TTE: 06/18/18  IMPRESSIONS    1. The left ventricle has normal systolic function, with an ejection fraction of 60-65%. The cavity size was mildly dilated. There is mildly increased left ventricular wall thickness. Left ventricular diastolic Doppler parameters are consistent with  indeterminate diastolic dysfunction.  2. The right ventricle has normal systolc function. The cavity was normal. Right ventricular systolic pressure is moderately elevated with an estimated pressure of 56.5 mmHg.  3. Left atrial size was mildly dilated.  4. There is mild mitral annular calcification present.  5. The tricuspid valve was grossly normal.  6. The aortic valve is tricuspid Mild calcification of the aortic valve. No stenosis of the aortic valve.  7. The inferior vena cava was dilated in size with >50% respiratory variability.  8. Technically difficult; definity used; normal LV systolic function; mild LVH and LVE; mild LAE; mild TR; moderate pulmonary hypertension.  Patient Profile     63 y.o. male with CAD with CABG 2002 and STEMI 2015, with graft failure and DES to LCX, PAF recurrent PE/DVT, HTN, HLD, DM and OSA no longer wearing CPAP, With amiodarone and BB he had MOBITZ 2 and BB stopped, PAD with PCI of L mid to distal Ant tib, 2018. Also Lt BKA at [facility]. Presented with Rt lower ext edema and drainage  Assessment & Plan    1. Acute CHF with Rt lower ext edema and hx LBKA: Pt had increased his lasix to 160 mg from 80 mg daily off and on for last 7 days prior to admission.  -- currently on 80mg  IV lasix BID. Net - 3.3L, weight trending down. Renal function stable. Would continue with diuretic.   2. Bradycardia with hx of Mobitz 2: BB stopped on admission.  3. A flutter with hx of a fib transiently in 2015: HR stable,  remains in aflutter. Planned for DCCV today.  4. CAD with hx CABG: Stents since with last cath 2015 patent LIMA to LAD, patent VG to OM, occluded VG to diag. Occluded VG to PDA, patent stents in native RCA and successful stenting of the mid to distal native LCx with DES.   5. Hx of recurrent PE/DVT: on coumadin and neg PE and DVT this admit. Coumadin dosing per PharmD. INR 2.4  6. Leg wounds: wound RN following.  7. HTN: stable  8. HLD:  continue statin last statin 04/2018 was 63.  9. DM: Hgb A1c 10.1. On SSI. Management per primary  For questions or updates, please contact [place] Please consult [email or web address] for contact info under        Signed, Reino Bellis, NP  06/19/2018, 9:27 AM    Agree with note by Reino Bellis NP-C  Mr. Hopkin has diuresed 3.3 L.  He is on Coumadin for PE/DVT with an INR of 2.4 and has been therapeutic for the last several months.  He does have a flutter which was slow yesterday but somewhat faster today.  Possible that this was contributing to his volume overload.  His exam is benign.  He scheduled for DC cardioversion today.   Lorretta Harp, M.D., [place], [facility], [place] [address] [place], [place]  [address]  [phone] 06/19/2018 11:35 AM  Addendum: Had successful cardioversion to sinus rhythm today.  I suspect this will assist in treatment of heart failure.  He is diuresed over 3 L and is clinically improved.  He is on Coumadin and is therapeutic.  At this point, we will sign off and arrange follow-up with Dr. Percival Spanish as an outpatient.

## 2018-06-20 ENCOUNTER — Encounter (HOSPITAL_COMMUNITY): Payer: Self-pay | Admitting: Cardiovascular Disease

## 2018-06-20 DIAGNOSIS — R590 Localized enlarged lymph nodes: Secondary | ICD-10-CM

## 2018-06-20 DIAGNOSIS — I110 Hypertensive heart disease with heart failure: Secondary | ICD-10-CM

## 2018-06-20 LAB — PROTIME-INR
INR: 2.4 — ABNORMAL HIGH (ref 0.8–1.2)
Prothrombin Time: 25.8 seconds — ABNORMAL HIGH (ref 11.4–15.2)

## 2018-06-20 LAB — BASIC METABOLIC PANEL
Anion gap: 15 (ref 5–15)
BUN: 25 mg/dL — ABNORMAL HIGH (ref 8–23)
CO2: 25 mmol/L (ref 22–32)
Calcium: 9.1 mg/dL (ref 8.9–10.3)
Chloride: 97 mmol/L — ABNORMAL LOW (ref 98–111)
Creatinine, Ser: 1.15 mg/dL (ref 0.61–1.24)
GFR calc Af Amer: >60 mL/min (ref >60)
GFR calc non Af Amer: >60 mL/min (ref >60)
Glucose, Bld: 191 mg/dL — ABNORMAL HIGH (ref 70–99)
Potassium: 3.9 mmol/L (ref 3.5–5.1)
Sodium: 137 mmol/L (ref 135–145)

## 2018-06-20 LAB — GLUCOSE, CAPILLARY
Glucose-Capillary: 195 mg/dL — ABNORMAL HIGH (ref 70–99)
Glucose-Capillary: 228 mg/dL — ABNORMAL HIGH (ref 70–99)
Glucose-Capillary: 194 mg/dL — ABNORMAL HIGH (ref 70–99)
Glucose-Capillary: 261 mg/dL — ABNORMAL HIGH (ref 70–99)

## 2018-06-20 LAB — MAGNESIUM: Magnesium: 2.1 mg/dL (ref 1.7–2.4)

## 2018-06-20 MED ORDER — FUROSEMIDE 80 MG PO TABS
80.0000 mg | ORAL_TABLET | Freq: Every day | ORAL | Status: DC
  Administered 2018-06-20: 80 mg via ORAL
  Filled 2018-06-20: qty 1

## 2018-06-20 MED ORDER — WARFARIN SODIUM 10 MG PO TABS: 10.0000 mg | ORAL_TABLET | Freq: Once | ORAL | Status: DC

## 2018-06-20 MED ORDER — CEPHALEXIN 500 MG PO CAPS: 500.0000 mg | ORAL_CAPSULE | Freq: Two times a day (BID) | ORAL | 0 refills | Status: DC

## 2018-06-20 NOTE — Progress Notes (Signed)
IV and telemetry discontinued at this time. CCMD notified. Discharge instructions reviewed with patient. All questions answered.   K. Starr Rigby Swamy, RN 

## 2018-06-20 NOTE — Progress Notes (Signed)
Bobby Lindsey for warfarin Indication: recurrent PE/DVT, PAF  Patient Measurements: Height: 6' (182.9 cm) Weight: 251 lb 1.7 oz (113.9 kg)(includes prosthesis) IBW/kg (Calculated) : 77.6  Vital Signs: Temp: 98.8 F (37.1 C) (05/07 0823) Temp Source: Oral (05/07 0823) BP: 148/76 (05/07 0823) Pulse Rate: 65 (05/07 0823)  Labs: Recent Labs    06/17/18 1202  06/17/18 1327 06/17/18 2142 06/18/18 0324 06/19/18 0307 06/20/18 0430  HGB 15.0  --   --   --  15.3  --   --   HCT 45.8  --   --   --  46.0  --   --   PLT 196  --   --   --  182  --   --   LABPROT  --    < > 26.0*  --  25.2* 25.6* 25.8*  INR  --    < > 2.4*  --  2.3* 2.4* 2.4*  CREATININE 1.28*  --   --   --  1.10 1.17 1.15  TROPONINI  --   --  <0.03 <0.03  --   --   --    < > = values in this interval not displayed.    Assessment: 63 y/o male admitted with increased SOB and R leg swelling. He takes warfarin PTA for PAF and hx VTE. INR is therapeutic at 2.4. No bleeding noted, CBC is normal. CTA chest neg for PE, RLE duplex neg for DVT.   S/P Cardioversion 5/6; INR today 2.4  PTA: 10 mg daily except 5 mg Mon/Fri per OP clinic visit 4/24, last taken 5/3  Goal of Therapy:  INR 2-3 Monitor platelets by anticoagulation protocol: Yes   Plan:  Warfarin 10 mg x 1 tonight Daily INR  Levester Fresh, PharmD, BCPS, BCCCP Clinical Pharmacist [phone]  Please check AMION for all [place] numbers  06/20/2018 9:11 AM

## 2018-06-20 NOTE — Progress Notes (Signed)
   Subjective:  Feeling well today and has more energy than the last few days. Denies CP, SOB, or leg swelling. Discussed strategies to monitor for recurrence of heart failure.   Objective:  Vital signs in last 24 hours: Vitals:   06/19/18 1400 06/19/18 1954 06/20/18 0012 06/20/18 0408  BP: (!) 141/48 (!) 146/76 133/77   Pulse: 62  68   Resp:  13 (!) 22   Temp:  98.2 F (36.8 C) 98.6 F (37 C) 98.2 F (36.8 C)  TempSrc:  Oral Oral Oral  SpO2:  92% 92% 94%  Weight:    113.9 kg  Height:       Constitution: NAD, sitting up in bed Cardio: RRR, no m/r/g  Respiratory: CTA, no w/r/r  MSK: trace edema, left BKA  Neuro: a&o, pleasant  Skin: anterior right shin +erythema from yesterday, bullae draining, bandage in place   Assessment/Plan:  Active Problems:   Bradycardia   Cellulitis of right lower extremity   Atypical atrial flutter (HCC)   Educated About Covid-19 Virus Infection  63yo male with PMH CAD with CABG 2002 and RCA DES, recurrent PE/DVT on coumadin, STEMI 2015, HTN, HLD, TIIDM s/p BKA 2018 and OSA presenting with worsening dyspnea on exertion, orthopneafor 8 days, and RLE redness and skin changesthat he first noticed the day before admission.   Atrial Flutter with AV Block and Ventricular [place]ala Dock - resolved Successful cardioversion yesterday, now in NSR. Appears euvolemic, kidney function stable. Likely stable for discharge, will follow-up with cardiology prior to discharge.   - cardiology consulted, appreciate recs - recommended close follow-up with PCP, has f/u with cardiology - resumed home lasix 80 mg po qd - resume beta blocker at discharge - provided education on recurrence of HF symptoms, he plans to start weighing himself in the mornings.   RLE Cellulitis Continuing to improve.   Cont. Cephalexin day 4/5 - will have one more dose tomorrow after discharge  TIIDM Elevated. Will resume home dose at discharge, otherwise will increase lantus.    VTE:  coumadin IVF: none Diet: carb modified Code: full   Dispo: Anticipated discharge today.    Marty Heck, DO 06/20/2018, 6:21 AM Pager: [phone]

## 2018-06-20 NOTE — Discharge Summary (Signed)
Name: Bobby Lindsey MRN: [id number] DOB: 1956/01/19 63 y.o. PCP: Bobby Hector, MD  Date of Admission: 06/17/2018 11:37 AM Date of Discharge: 5/7/202005/08/2018 Attending Physician: No att. providers found  Discharge Diagnosis: 1. Atrial Flutter with AV Block and Ventricular Bradycardia 2. Acute on Chronic Diastolic Heart Failure  2. RLE Cellulitis  3. Acute Kidney Injury 4. Mediastinal/Hilar Bilateral Adenopathy  Discharge Medications: Allergies as of 06/20/2018      Reactions   Simvastatin Diarrhea, Other (See Comments)   Fatigue, diarrhea      Medication List    TAKE these medications   atorvastatin 20 MG tablet Commonly known as:  LIPITOR Take 1 tablet (20 mg total) by mouth daily.   cephALEXin 500 MG capsule Commonly known as:  KEFLEX Take 1 capsule (500 mg total) by mouth every 12 (twelve) hours.   Fifty50 Glucose Meter 2.0 w/Device Kit Use as directed.   furosemide 80 MG tablet Commonly known as:  LASIX Take 1 tablet (80 mg total) by mouth daily. What changed:    when to take this  additional instructions   gabapentin 300 MG capsule Commonly known as:  NEURONTIN Take 600 mg by mouth at bedtime as needed (pain in foot and ankle).   HumaLOG KwikPen 100 UNIT/ML injection Generic drug:  insulin lispro Inject 36 Units into the skin 3 (three) times daily before meals.   Insulin Syringe-Needle U-100 31G X 5/16" 0.3 ML Misc use as directed   isosorbide mononitrate 30 MG 24 hr tablet Commonly known as:  IMDUR Take 1 tablet (30 mg total) by mouth daily.   losartan 25 MG tablet Commonly known as:  COZAAR Take 1 tablet (25 mg total) by mouth daily.   metFORMIN 500 MG 24 hr tablet Commonly known as:  GLUCOPHAGE-XR Take 500 mg by mouth daily with breakfast.   metoprolol succinate 25 MG 24 hr tablet Commonly known as:  TOPROL-XL Take 0.5 tablets (12.5 mg total) by mouth daily.   nitroGLYCERIN 0.4 MG SL tablet Commonly known as:  NITROSTAT Place  1 tablet (0.4 mg total) under the tongue every 5 (five) minutes as needed for chest pain. NEEDS APPOINTMENT   pantoprazole 40 MG tablet Commonly known as:  PROTONIX TAKE 1 TABLET (40 MG TOTAL) BY MOUTH AS NEEDED (INDIGESTION). What changed:  See the new instructions.   potassium chloride 10 MEQ tablet Commonly known as:  K-DUR Take 10 mEq by mouth every morning.   Toujeo SoloStar 300 UNIT/ML Sopn Generic drug:  Insulin Glargine (1 Unit Dial) Inject 70 Units into the skin every morning. Patient takes Toujeo 70 units QAM; last dose on 06/17/18   TYLENOL 500 MG tablet Generic drug:  acetaminophen Take 1,000 mg every 8 (eight) hours as needed by mouth for mild pain or headache.   warfarin 5 MG tablet Commonly known as:  COUMADIN Take as directed. If you are unsure how to take this medication, talk to your nurse or doctor. Original instructions:  Take 1 tablet (5 mg total) by mouth as directed. Take as directed by the coumadin clinic. What changed:    how much to take  when to take this  additional instructions       Disposition and follow-up:   [name] was discharged from [facility] in Stable condition.  At the hospital follow up visit please address:  1.  Atrial Flutter with AV Block and Ventricular Bradycardia: resolved with cardioversion.  Acute on Chronic Diastolic Heart Failure: diuresed  with IV lasix 66m bid. Discharged with resumption of home dose lasix.  RLE Cellulitis: Improved with antibiotics, discharged with one more day keflex to complete 5 days antibiotics total.  Acute Kidney Injury: resolved with diuresis Mediastinal/Hilar Adenopathy: Incidentally seen on CT angio, possibly reactive 2/2 interstitial disease. Additionally, left lower lobe calcifications and tracheobromchomalacia are present. Radiology recommends f/u with pulmonary. Discussed with patient.  2.  Labs / imaging needed at time of follow-up: BMP, CT  3.  Pending labs/  test needing follow-up: none  Follow-up Appointments: Follow-up Information    HMinus Breeding MD Follow up.   Specialty:  Cardiology Why:  The office will call to arrange for outpatient telehealth visit through mobile phone.  Contact information: [address] [id number]        KAdin Hector MD Follow up.   Specialty:  Internal Medicine Why:  Please schedule a hospital folllow up appointment with your regular doctor within the next 7 days.  Contact information: [id number] HospitalCourse by problem list: Atrial Flutter with AV Block and Ventricular Bradycardia: Acute on Chronic Diastolic Heart Failure  Acute Kidney Injury Bobby Brasil WBrickleyis a 89yomale with PMH CAD with CABG 2002 and RCA DES, recurrent PE/DVT on coumadin, STEMI in 2015 w/DES, HTN, HLD, TIIDM s/p BKA 2018 and OSA who presented with worsening dyspnea on exertion, orthopnea for past 8 days, and RLE redness and skin changes that he first noticed the day prior to admission. He normally takes 862mpo lasix and had double his dose for the previous three days. He was found to be slightly volume up with LE edema although CXR showed chronic atelectasis without interstitial opacity, weight similar to previous office visit, and BNP 307. CT angio was done which showed new nonspecific slight mediastinal and bilateral hilar adenopathy. EKG showed atrial fibrillation with ventricular bradycardia. He was admitted to the IMTS service for further workup and cardiology was additionally consulted.  Home metoprolol was held for bradycardia. He was diuresed with IV lasix with improvement in breathing, edema, and resolution of AKI. Echo showed normal systolic function and increased right ventricular systolic pressure. Successful cardioversion was done on day 2 of admission, and he returned to sinus rhythm. He was discharged with follow-up  with cardiology and resolution of home metoprolol.   RLE Cellulitis  He had RLE warmth, erythema and bullae on anterior shin appearing to be cellulitis. He was treated with cephazolin IV initially then transitioned to po keflex 500 mg qid. He was discharged with one more day of antibiotic therapy.   Mediastinal/Hilar Adenopathy Slight mediastinal bilateral hilar adenopathy seen on CT angio, thought to possibly be reactive. Additionally, left lower lobe calcifications and tracheobronchomalaic were present. Radiology recommends follow-up with Pulmonary specialist. No systemic symptoms. Shortness of breath resolved with cardioversion and diuresis. Discussed follow-up recommendations with him.   Discharge Vitals:   BP 137/71 (BP Location: Right Arm)    Pulse 65    Temp 98.3 F (36.8 C) (Oral)    Resp (!) 21    Ht 6' (1.829 m)    Wt 113.9 kg Comment: includes prosthesis   SpO2 92%    BMI 34.06 kg/m   Pertinent Labs, Studies, and Procedures:   CT Angio 06/18/2018 IMPRESSION: 1. No pulmonary emboli. 2. Interval development of borderline cardiomegaly since 2012. 3. Tracheobronchomalacia with a mosaic appearance of the  lungs which can be seen with small airway disease. 4. New patchy atelectasis at the left lung base with new pleural calcifications on the left hemidiaphragm. 5. New nonspecific slight mediastinal and bilateral hilar adenopathy. This could be reactive. Does the patient have any history of malignancy?  BNP: 307  ECHO 06/18/2018 1. The left ventricle has normal systolic function, with an ejection fraction of 60-65%. The cavity size was mildly dilated. There is mildly increased left ventricular wall thickness. Left ventricular diastolic Doppler parameters are consistent with  indeterminate diastolic dysfunction.  2. The right ventricle has normal systolc function. The cavity was normal. Right ventricular systolic pressure is moderately elevated with an estimated pressure of 56.5  mmHg.  3. Left atrial size was mildly dilated.  4. There is mild mitral annular calcification present.  5. The tricuspid valve was grossly normal.  6. The aortic valve is tricuspid Mild calcification of the aortic valve. No stenosis of the aortic valve.  7. The inferior vena cava was dilated in size with >50% respiratory variability.  8. Technically difficult; definity used; normal LV systolic function; mild LVH and LVE; mild LAE; mild TR; moderate pulmonary hypertension.  CXR 06/17/2018 IMPRESSION: Stable left basilar opacities are noted most consistent with scarring or subsegmental atelectasis with associated pleural thickening. Electronically Signed   By: Marijo Conception M.D.   On: 06/17/2018 13:01  Discharge Instructions: Discharge Instructions    Diet - low sodium heart healthy   Complete by:  As directed    Discharge instructions   Complete by:  As directed    You were hospitalized for heart arrhythmia and shortness of breath. Thank you for allowing [place] to be part of your care.   Please follow-up with cardiology as scheduled. They will call you to arrange follow-up.  Please call your primary care doctor and schedule follow-up for next week as well. This can be done as a telehealth appointment if needed.   Please note these changes made to your medications:   Please START cephalexin (KEFLEX) 500 MG capsule - take one this evening and then every 12 hours.   Increase activity slowly   Complete by:  As directed       Signed: Molli Hazard A, DO 06/21/2018, 11:07 AM   Pager: [phone]

## 2018-06-21 ENCOUNTER — Telehealth: Payer: Self-pay | Admitting: Cardiology

## 2018-06-21 NOTE — Telephone Encounter (Signed)
LMTCB - Patient needs 1-2 week hospital follow up with Dr. Percival Spanish. Please schedule virtual visit./dc

## 2018-06-25 NOTE — Telephone Encounter (Signed)
New Message   Patient prefers televisit due to not having a smart phone or computer.  Please call to get consent and help setup.

## 2018-06-26 ENCOUNTER — Telehealth: Payer: Self-pay | Admitting: *Deleted

## 2018-06-26 ENCOUNTER — Telehealth: Payer: Self-pay | Admitting: Cardiology

## 2018-06-26 ENCOUNTER — Telehealth: Payer: Self-pay | Admitting: Cardiovascular Disease

## 2018-06-26 ENCOUNTER — Encounter: Payer: Self-pay | Admitting: Cardiology

## 2018-06-26 ENCOUNTER — Telehealth (INDEPENDENT_AMBULATORY_CARE_PROVIDER_SITE_OTHER): Payer: Managed Care, Other (non HMO) | Admitting: Cardiology

## 2018-06-26 VITALS — BP 140/77 | HR 52 | Ht 72.0 in | Wt 261.0 lb

## 2018-06-26 DIAGNOSIS — I484 Atypical atrial flutter: Secondary | ICD-10-CM | POA: Diagnosis not present

## 2018-06-26 DIAGNOSIS — I2510 Atherosclerotic heart disease of native coronary artery without angina pectoris: Secondary | ICD-10-CM

## 2018-06-26 DIAGNOSIS — I480 Paroxysmal atrial fibrillation: Secondary | ICD-10-CM

## 2018-06-26 DIAGNOSIS — I5033 Acute on chronic diastolic (congestive) heart failure: Secondary | ICD-10-CM

## 2018-06-26 MED ORDER — TORSEMIDE 20 MG PO TABS: 40.0000 mg | ORAL_TABLET | Freq: Two times a day (BID) | ORAL | 3 refills | Status: DC

## 2018-06-26 NOTE — Progress Notes (Signed)
Virtual Visit via Video Note   This visit type was conducted due to national recommendations for restrictions regarding the COVID-19 Pandemic (e.g. social distancing) in an effort to limit this patient's exposure and mitigate transmission in our community.  Due to his co-morbid illnesses, this patient is at least at moderate risk for complications without adequate follow up.  This format is felt to be most appropriate for this patient at this time.  All issues noted in this document were discussed and addressed.  A limited physical exam was performed with this format.  Please refer to the patient's chart for his consent to telehealth for [facility].   Date:  06/26/2018   ID:  Bobby Lindsey, DOB [birth year], MRN [id number]  Patient Location: Home Provider Location: Home  PCP:  Adin Hector, MD  Cardiologist:  Minus Breeding, MD  Electrophysiologist:  None   Evaluation Performed:  Follow-Up Visit  Chief Complaint:  SOB  History of Present Illness:    Bobby Lindsey is a 63 y.o. male with acute on chronic diastolic HF.  He was just in the hospital for this.  I reviewed these records for this visit.    He presented with volume overload and was in atrial flutter.  Echo was done and his EF was 60 - 65%.  There were no significant valvular abnormalities.    He was also treated for cellulitis.  He did have cardioversion.  Of note he had a slow ventricular rate.  He had diuresis.  Unfortunately he does not know what his weight was after he had been diuresed and when he got home.  He says he is not having the leg weeping that he was having but he is having some dyspnea at night.  He is vague about this and not describing PND or orthopnea.  He says that his HR gets into the 40s at night but it is higher than it was when he was in flutter.  The patient denies any new symptoms such as chest discomfort, neck or arm discomfort. There has been no new PND or orthopnea. There have been no reported  palpitations, presyncope or syncope.   The patient does not have symptoms concerning for COVID-19 infection (fever, chills, cough, or new shortness of breath).    Past Medical History:  Diagnosis Date  . Anginal pain ([place])    last pm  . Atrial fibrillation ([place])    a. Transient during 03/2013 admission.  . Bradycardia    a. Bradycardia/pauses/possible Mobitz II during 03/2013 adm. Not on BB due to this.  Marland Kitchen CAD (coronary artery disease)    a. s/p CABG 2002. b. Hx Cypher stent to the RCA. c. Inf-lat STEMI 03/2013:  LHC (04/05/13):  mLAD occluded, pD1 90, apical br of Dx occluded, CFX occluded, pOM1 90-95, RCA stents patent, diff RCA 30, S-Dx occluded, S-PDA occluded, S-OM1 40-50, L-LAD patent, EF 40% with inf HK.  PCI:  Promus (2.5 x 28) DES to mid to dist CFX.  Marland Kitchen Charcot's joint of knee   . COPD (chronic obstructive pulmonary disease) ([place])   . Deep venous thrombosis ([facility])    right lower extremity  . Diabetes mellitus    a. A1C 10.7 in 03/2013.  . Diastolic CHF ([place])    a. EF 40% by cath, 55-60% during 03/2013 adm, required IV diuresis.  . DVT, lower extremity, recurrent ([place])    a. Hx recurrent DVT per record.  . Dyslipidemia   . Elevated CK  a. Pt has refused rheum workup in the past.  . GERD (gastroesophageal reflux disease)   . History of hiatal hernia   . HTN (hypertension)    x 15 years  . Hx of cardiovascular stress test    a. Lexiscan Myoview (03/2010):  diaph atten vs inf scar, no ischemia, EF 47%; Low Risk.  Marland Kitchen Hx of echocardiogram    a. Echo (04/08/13):  Mild LVH, EF 55-60%, restrictive physiology, severe LAE, mild reduced RVSF, mild RAE.  . Leg pain    ABI 6/16:  R 1.2, L 1.1 - normal  . Myocardial infarction ([place])   . Obesity   . Peripheral neuropathy   . Pulmonary embolism ([place])   . Sleep apnea    Past Surgical History:  Procedure Laterality Date  . CARDIOVERSION N/A 06/19/2018   Procedure: CARDIOVERSION;  Surgeon: Acie Fredrickson, Wonda Cheng, MD;  Location: [facility];   Service: Cardiovascular;  Laterality: N/A;  . CORONARY ANGIOPLASTY WITH STENT PLACEMENT    . CORONARY ARTERY BYPASS GRAFT     4 time since 2002  . LEFT HEART CATHETERIZATION WITH CORONARY/GRAFT ANGIOGRAM  04/05/2013   Procedure: LEFT HEART CATHETERIZATION WITH Beatrix Fetters;  Surgeon: Peter M Martinique, MD;  Location[facility] LAB;  Service: Cardiovascular;;  . left knee surgery    . LOWER EXTREMITY ANGIOGRAPHY Left 12/25/2016   Procedure: LOWER EXTREMITY ANGIOGRAPHY;  Surgeon: Algernon Huxley, MD;  Location: [facility] CV LAB;  Service: Cardiovascular;  Laterality: Left;  . PERCUTANEOUS CORONARY STENT INTERVENTION (PCI-S)  04/05/2013   Procedure: PERCUTANEOUS CORONARY STENT INTERVENTION (PCI-S);  Surgeon: Peter M Martinique, MD;  Location: [facility] CATH LAB;  Service: Cardiovascular;;  DES to native Mid cx  . VASCULAR SURGERY       Current Meds  Medication Sig  . acetaminophen (TYLENOL) 500 MG tablet Take 1,000 mg every 8 (eight) hours as needed by mouth for mild pain or headache.   Marland Kitchen atorvastatin (LIPITOR) 20 MG tablet Take 1 tablet (20 mg total) by mouth daily.  . Blood Glucose Monitoring Suppl (FIFTY50 GLUCOSE METER 2.0) w/Device KIT Use as directed.  . cephALEXin (KEFLEX) 500 MG capsule Take 1 capsule (500 mg total) by mouth every 12 (twelve) hours.  . furosemide (LASIX) 80 MG tablet Take 1 tablet (80 mg total) by mouth daily. (Patient taking differently: Take 80 mg by mouth 2 (two) times daily. Take extra 80 mg at night for extra fluid)  . gabapentin (NEURONTIN) 300 MG capsule Take 600 mg by mouth at bedtime as needed (pain in foot and ankle).   . Insulin Glargine, 1 Unit Dial, (TOUJEO SOLOSTAR) 300 UNIT/ML SOPN Inject 70 Units into the skin every morning. Patient takes Toujeo 70 units QAM; last dose on 06/17/18  . insulin lispro (HUMALOG KWIKPEN) 100 UNIT/ML injection Inject 36 Units into the skin 3 (three) times daily before meals.   . Insulin Syringe-Needle U-100 31G X 5/16" 0.3 ML MISC use  as directed  . isosorbide mononitrate (IMDUR) 30 MG 24 hr tablet Take 1 tablet (30 mg total) by mouth daily.  Marland Kitchen losartan (COZAAR) 25 MG tablet Take 1 tablet (25 mg total) by mouth daily.  . metFORMIN (GLUCOPHAGE-XR) 500 MG 24 hr tablet Take 500 mg by mouth daily with breakfast.  . metoprolol succinate (TOPROL-XL) 25 MG 24 hr tablet Take 0.5 tablets (12.5 mg total) by mouth daily.  . nitroGLYCERIN (NITROSTAT) 0.4 MG SL tablet Place 1 tablet (0.4 mg total) under the tongue every 5 (five) minutes as  needed for chest pain. NEEDS APPOINTMENT  . pantoprazole (PROTONIX) 40 MG tablet TAKE 1 TABLET (40 MG TOTAL) BY MOUTH AS NEEDED (INDIGESTION). (Patient taking differently: Take 40 mg by mouth every evening. )  . potassium chloride (K-DUR) 10 MEQ tablet Take 10 mEq by mouth every morning.  . warfarin (COUMADIN) 5 MG tablet Take 1 tablet (5 mg total) by mouth as directed. Take as directed by the coumadin clinic. (Patient taking differently: Take 5-10 mg by mouth daily at 6 PM. 10 mg daily except 5 mg on Mon/Fri)     Allergies:   Simvastatin   Social History   Tobacco Use  . Smoking status: Never Smoker  . Smokeless tobacco: Never Used  Substance Use Topics  . Alcohol use: Yes    Comment: rare  . Drug use: No     Family Hx: The patient's family history includes Cancer in his father; Heart disease in his mother; Hypertension in his mother and sister. There is no history of Heart attack or Stroke.  ROS:   Please see the history of present illness.     All other systems reviewed and are negative.   Prior CV studies:   The following studies were reviewed today:  Hospital records reviewed  Labs/Other Tests and Data Reviewed:    EKG:  06/19/18, NSR, rate 61, axis WNL, QT prolonged non specific ST T wave changes  Recent Labs: 06/17/2018: B Natriuretic Peptide 307.1; TSH 3.144 06/18/2018: ALT 29; Hemoglobin 15.3; Platelets 182 06/20/2018: BUN 25; Creatinine, Ser 1.15; Magnesium 2.1; Potassium 3.9;  Sodium 137   Recent Lipid Panel Lab Results  Component Value Date/Time   CHOL 123 04/18/2018 08:33 AM   TRIG 117 04/18/2018 08:33 AM   HDL 37 (L) 04/18/2018 08:33 AM   CHOLHDL 3.3 04/18/2018 08:33 AM   CHOLHDL 3.6 CALC 03/11/2008 09:30 AM   LDLCALC 63 04/18/2018 08:33 AM    Wt Readings from Last 3 Encounters:  06/26/18 261 lb (118.4 kg)  06/20/18 251 lb 1.7 oz (113.9 kg)  04/03/18 264 lb 6.4 oz (119.9 kg)     Objective:    Vital Signs:  BP 140/77   Pulse (!) 52   Ht 6' (1.829 m)   Wt 261 lb (118.4 kg)   SpO2 93%   BMI 35.40 kg/m    VITAL SIGNS:  reviewed GEN:  no acute distress EYES:  sclerae anicteric, EOMI - Extraocular Movements Intact MUSCULOSKELETAL:  no obvious deformities. NEURO:  alert and oriented x 3, no obvious focal deficit PSYCH:  normal affect  ASSESSMENT & PLAN:     ACUTE ON CHRONIC DIASTOLIC HF:   I am going to change to Torsemide.  He will get a BMET in 10 days.    CAD:  The patient has no new sypmtoms.  No further cardiovascular testing is indicated.  We will continue with aggressive risk reduction and meds as listed.   HTN:    Blood pressure is well controlled.   HYPERLIPIDEMIA:   LDL was at target.  No change in therapy.   DM:     This is managed per Adin Hector, MD  OSA:    He cannot wear his CPAP but now wants to try again and I will refer him for a sleep study.   ATRIAL FLUTTER:  I will order a two week ZIO patch .  COVID-19 Education: The signs and symptoms of COVID-19 were discussed with the patient and how to seek care for testing (follow  up with PCP or arrange E-visit).  The importance of social distancing was discussed today.  Time:   Today, I have spent  30 minutes with the patient with telehealth technology discussing the above problems.   Greater than 60 minutes reviewing the chart and seeing the patient    Medication Adjustments/Labs and Tests Ordered: Current medicines are reviewed at length with the patient  today.  Concerns regarding medicines are outlined above.   Tests Ordered: No orders of the defined types were placed in this encounter.   Medication Changes: No orders of the defined types were placed in this encounter.   Disposition:  Follow up with me in 1 month  Signed, Minus Breeding, MD  06/26/2018 12:31 PM    [place]

## 2018-06-26 NOTE — Telephone Encounter (Signed)
LVM for patient to call and schedule 1 month followup with Dr. Percival Spanish.

## 2018-06-26 NOTE — Telephone Encounter (Signed)
Called patient and LVM to call back and schedule a sleep clinic appointment with Dr. Claiborne Billings.

## 2018-06-26 NOTE — Patient Instructions (Addendum)
Medication Instructions:  STOP- Furosemide START- Torsemide 40 mg daily  If you need a refill on your cardiac medications before your next appointment, please call your pharmacy.  Labwork: BMP in 10 days HERE IN OUR OFFICE AT [facility]  You will NOT need to fast   Take the provided lab slips with you to the lab for your blood draw.   When you have your labs (blood work) drawn today and your tests are completely normal, you will receive your results only by MyChart Message (if you have MyChart) -OR-  A paper copy in the mail.  If you have any lab test that is abnormal or we need to change your treatment, we will call you to review these results.  Testing/Procedures: Your physician has recommended that you wear a Zio Patch monitor. Holter monitors are medical devices that record the heart's electrical activity. Doctors most often use these monitors to diagnose arrhythmias. Arrhythmias are problems with the speed or rhythm of the heartbeat. The monitor is a small, portable device. You can wear one while you do your normal daily activities. This is usually used to diagnose what is causing palpitations/syncope (passing out).   Follow-Up: . Your physician recommends that you schedule a follow-up appointment in: 1 Month Your physician recommends that you schedule a follow-up appointment in: Sleep Clinic appt with Dr Claiborne Billings   At [facility], you and your health needs are our priority.  As part of our continuing mission to provide you with exceptional heart care, we have created designated Provider Care Teams.  These Care Teams include your primary Cardiologist (physician) and Advanced Practice Providers (APPs -  Physician Assistants and Nurse Practitioners) who all work together to provide you with the care you need, when you need it.  Thank you for choosing [facility]!!

## 2018-06-26 NOTE — Telephone Encounter (Signed)
[employer] to mail a 3 day ZIO XT long term holter monitor to your home.  Instructions reviewed briefly as they are included in the monitor kit.

## 2018-06-26 NOTE — Telephone Encounter (Signed)
Pt had an E-visit appt with Dr Percival Spanish today

## 2018-06-27 ENCOUNTER — Telehealth: Payer: Managed Care, Other (non HMO) | Admitting: Cardiology

## 2018-06-27 ENCOUNTER — Telehealth: Payer: Self-pay | Admitting: Cardiology

## 2018-06-27 NOTE — Telephone Encounter (Signed)
LVM for patient to call and schedule a 1 month followup with Dr. Percival Spanish and a visit with Dr. Claiborne Billings for sleep.

## 2018-07-02 ENCOUNTER — Ambulatory Visit (INDEPENDENT_AMBULATORY_CARE_PROVIDER_SITE_OTHER): Payer: Managed Care, Other (non HMO)

## 2018-07-02 DIAGNOSIS — I484 Atypical atrial flutter: Secondary | ICD-10-CM

## 2018-07-03 ENCOUNTER — Telehealth: Payer: Self-pay | Admitting: Cardiology

## 2018-07-03 NOTE — Telephone Encounter (Signed)
New Message     Pts wife is calling because she is wondering if he needs to hold Warfarin because they are removing a cyst on his back next week.  She also says an in office visit is suppose to be scheduled with Dr Percival Spanish In one month   Please call

## 2018-07-03 NOTE — Telephone Encounter (Signed)
Patient is scheduled for 07/09/18 for cyst removal. Wife is not even sure physician doing procedure is aware patient on Warfarin Advised wife to call them first thing in am an let them know and find out if he is requiring holding Warfarin. She is to cal back tomorrow with updated information Will forward to Pharm D for review

## 2018-07-04 NOTE — Telephone Encounter (Signed)
Dermatologic procedures are consider low bleeding risk and not needed to HOLD warfarin unless requested by surgeon.

## 2018-07-05 ENCOUNTER — Other Ambulatory Visit: Payer: Self-pay | Admitting: Cardiology

## 2018-07-09 ENCOUNTER — Emergency Department
Admission: EM | Admit: 2018-07-09 | Discharge: 2018-07-10 | Disposition: A | Discharge status: Home / Self Care | Payer: Managed Care, Other (non HMO) | Attending: Emergency Medicine | Admitting: Emergency Medicine

## 2018-07-09 ENCOUNTER — Encounter: Payer: Self-pay | Admitting: Emergency Medicine

## 2018-07-09 ENCOUNTER — Other Ambulatory Visit: Payer: Self-pay

## 2018-07-09 DIAGNOSIS — I130 Hypertensive heart and chronic kidney disease with heart failure and stage 1 through stage 4 chronic kidney disease, or unspecified chronic kidney disease: Secondary | ICD-10-CM | POA: Diagnosis not present

## 2018-07-09 DIAGNOSIS — E1142 Type 2 diabetes mellitus with diabetic polyneuropathy: Secondary | ICD-10-CM | POA: Insufficient documentation

## 2018-07-09 DIAGNOSIS — I25810 Atherosclerosis of coronary artery bypass graft(s) without angina pectoris: Secondary | ICD-10-CM | POA: Insufficient documentation

## 2018-07-09 DIAGNOSIS — N183 Chronic kidney disease, stage 3 (moderate): Secondary | ICD-10-CM | POA: Diagnosis not present

## 2018-07-09 DIAGNOSIS — E1122 Type 2 diabetes mellitus with diabetic chronic kidney disease: Secondary | ICD-10-CM | POA: Insufficient documentation

## 2018-07-09 DIAGNOSIS — E1151 Type 2 diabetes mellitus with diabetic peripheral angiopathy without gangrene: Secondary | ICD-10-CM | POA: Insufficient documentation

## 2018-07-09 DIAGNOSIS — I252 Old myocardial infarction: Secondary | ICD-10-CM | POA: Insufficient documentation

## 2018-07-09 DIAGNOSIS — Z79899 Other long term (current) drug therapy: Secondary | ICD-10-CM | POA: Insufficient documentation

## 2018-07-09 DIAGNOSIS — Z9889 Other specified postprocedural states: Secondary | ICD-10-CM

## 2018-07-09 DIAGNOSIS — I4892 Unspecified atrial flutter: Secondary | ICD-10-CM | POA: Insufficient documentation

## 2018-07-09 DIAGNOSIS — I480 Paroxysmal atrial fibrillation: Secondary | ICD-10-CM | POA: Insufficient documentation

## 2018-07-09 DIAGNOSIS — T148XXA Other injury of unspecified body region, initial encounter: Secondary | ICD-10-CM

## 2018-07-09 DIAGNOSIS — I5030 Unspecified diastolic (congestive) heart failure: Secondary | ICD-10-CM | POA: Insufficient documentation

## 2018-07-09 DIAGNOSIS — L7621 Postprocedural hemorrhage and hematoma of skin and subcutaneous tissue following a dermatologic procedure: Secondary | ICD-10-CM | POA: Diagnosis present

## 2018-07-09 DIAGNOSIS — Z794 Long term (current) use of insulin: Secondary | ICD-10-CM | POA: Diagnosis not present

## 2018-07-09 DIAGNOSIS — J449 Chronic obstructive pulmonary disease, unspecified: Secondary | ICD-10-CM | POA: Diagnosis not present

## 2018-07-09 DIAGNOSIS — Z872 Personal history of diseases of the skin and subcutaneous tissue: Secondary | ICD-10-CM

## 2018-07-09 HISTORY — DX: Other specified postprocedural states: Z98.890

## 2018-07-09 HISTORY — DX: Personal history of diseases of the skin and subcutaneous tissue: Z87.2

## 2018-07-09 LAB — CBC WITH DIFFERENTIAL/PLATELET
Abs Immature Granulocytes: 0.04 10*3/uL (ref 0.00–0.07)
Basophils Absolute: 0.1 10*3/uL (ref 0.0–0.1)
Basophils Relative: 1 %
Eosinophils Absolute: 0.2 10*3/uL (ref 0.0–0.5)
Eosinophils Relative: 2 %
HCT: 43.4 % (ref 39.0–52.0)
Hemoglobin: 14.3 g/dL (ref 13.0–17.0)
Immature Granulocytes: 0 %
Lymphocytes Relative: 28 %
Lymphs Abs: 2.6 10*3/uL (ref 0.7–4.0)
MCH: 28.3 pg (ref 26.0–34.0)
MCHC: 32.9 g/dL (ref 30.0–36.0)
MCV: 85.9 fL (ref 80.0–100.0)
Monocytes Absolute: 0.9 10*3/uL (ref 0.1–1.0)
Monocytes Relative: 9 %
Neutro Abs: 5.6 10*3/uL (ref 1.7–7.7)
Neutrophils Relative %: 60 %
Platelets: 175 10*3/uL (ref 150–400)
RBC: 5.05 MIL/uL (ref 4.22–5.81)
RDW: 14.1 % (ref 11.5–15.5)
WBC: 9.4 10*3/uL (ref 4.0–10.5)
nRBC: 0 % (ref 0.0–0.2)

## 2018-07-09 LAB — COMPREHENSIVE METABOLIC PANEL
ALT: 30 U/L (ref 0–44)
AST: 26 U/L (ref 15–41)
Albumin: 3.8 g/dL (ref 3.5–5.0)
Alkaline Phosphatase: 72 U/L (ref 38–126)
Anion gap: 9 (ref 5–15)
BUN: 26 mg/dL — ABNORMAL HIGH (ref 8–23)
CO2: 27 mmol/L (ref 22–32)
Calcium: 9 mg/dL (ref 8.9–10.3)
Chloride: 103 mmol/L (ref 98–111)
Creatinine, Ser: 1.4 mg/dL — ABNORMAL HIGH (ref 0.61–1.24)
GFR calc Af Amer: >60 mL/min (ref >60)
GFR calc non Af Amer: 53 mL/min — ABNORMAL LOW (ref >60)
Glucose, Bld: 280 mg/dL — ABNORMAL HIGH (ref 70–99)
Potassium: 4.7 mmol/L (ref 3.5–5.1)
Sodium: 139 mmol/L (ref 135–145)
Total Bilirubin: 1 mg/dL (ref 0.3–1.2)
Total Protein: 7.4 g/dL (ref 6.5–8.1)

## 2018-07-09 LAB — PROTIME-INR
INR: 2.7 — ABNORMAL HIGH (ref 0.8–1.2)
Prothrombin Time: 28.4 seconds — ABNORMAL HIGH (ref 11.4–15.2)

## 2018-07-09 NOTE — ED Provider Notes (Signed)
[facility] Emergency Department Provider Note ____________________________________________   First MD Initiated Contact with Patient 07/09/18 2325     (approximate)  I have reviewed the triage vital signs and the nursing notes.   HISTORY  Chief Complaint Coagulation Disorder    HPI BARI HANDSHOE is a 63 y.o. male with PMH as noted below who is on Coumadin and who presents for evaluation after an episode of bleeding to a surgical wound on his back earlier this evening.  The patient states that he had a cyst removed on his back by his dermatologist Dr. Nehemiah Massed around 2 PM.  He started having some bleeding that required cautery, and then returned again in the evening due to persistent bleeding.  The bleeding was then controlled but Dr. Nehemiah Massed wanted him evaluated for possible supratherapeutic INR and for significant blood loss.  The patient states that the bleeding has subsided.  He denies any weakness or lightheadedness.  He is otherwise asymptomatic at this time.  Past Medical History:  Diagnosis Date  . Anginal pain ([place])    last pm  . Atrial fibrillation ([place])    a. Transient during 03/2013 admission.  . Bradycardia    a. Bradycardia/pauses/possible Mobitz II during 03/2013 adm. Not on BB due to this.  Marland Kitchen CAD (coronary artery disease)    a. s/p CABG 2002. b. Hx Cypher stent to the RCA. c. Inf-lat STEMI 03/2013:  LHC (04/05/13):  mLAD occluded, pD1 90, apical br of Dx occluded, CFX occluded, pOM1 90-95, RCA stents patent, diff RCA 30, S-Dx occluded, S-PDA occluded, S-OM1 40-50, L-LAD patent, EF 40% with inf HK.  PCI:  Promus (2.5 x 28) DES to mid to dist CFX.  Marland Kitchen Charcot's joint of knee   . COPD (chronic obstructive pulmonary disease) ([place])   . Deep venous thrombosis (HCC)    right lower extremity  . Diabetes mellitus    a. A1C 10.7 in 03/2013.  . Diastolic CHF ([place])    a. EF 40% by cath, 55-60% during 03/2013 adm, required IV diuresis.  . DVT, lower  extremity, recurrent ([place])    a. Hx recurrent DVT per record.  . Dyslipidemia   . Elevated CK    a. Pt has refused rheum workup in the past.  . GERD (gastroesophageal reflux disease)   . History of hiatal hernia   . HTN (hypertension)    x 15 years  . Hx of cardiovascular stress test    a. Lexiscan Myoview (03/2010):  diaph atten vs inf scar, no ischemia, EF 47%; Low Risk.  Marland Kitchen Hx of echocardiogram    a. Echo (04/08/13):  Mild LVH, EF 55-60%, restrictive physiology, severe LAE, mild reduced RVSF, mild RAE.  . Leg pain    ABI 6/16:  R 1.2, L 1.1 - normal  . Myocardial infarction ([place])   . Obesity   . Peripheral neuropathy   . Pulmonary embolism ([place])   . Sleep apnea     Patient Active Problem List   Diagnosis Date Noted  . Educated About Covid-19 Virus Infection   . Atypical atrial flutter ([place])   . Cellulitis of right lower extremity   . SOB (shortness of breath) 12/06/2017  . Uncontrolled type 2 diabetes mellitus with insulin therapy ([place]) 12/06/2017  . Primary osteoarthritis of left knee 11/02/2017  . Open wound 06/05/2017  . Diabetic ulcer of left lower leg ([place]) 05/16/2017  . 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 Impaired mobility  and activities of daily living 02/15/2017  . Atherosclerotic heart disease of native coronary artery without angina pectoris 01/05/2017  . Essential hypertension 12/19/2016  . Atherosclerosis of native arteries of the extremities with ulceration ([place]) 12/19/2016  . Peripheral vascular disease ([place]) 12/07/2016  . CHF (congestive heart failure) ([place]) 11/24/2016  . Demand ischemia ([place])   . 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 Stage 3 chronic kidney disease due to diabetes mellitus ([place]) 11/23/2016  . NSTEMI (non-ST elevated myocardial infarction) ([place]) 11/23/2016  . Acute diastolic CHF (congestive heart failure) ([place]) 11/23/2016  . Acute congestive heart failure ([place])   . Burn of left foot 11/21/2016  .  Diabetic peripheral neuropathy ([place]) 11/21/2016  . Diabetic ulcer of right foot associated with type 2 diabetes mellitus, with fat layer exposed ([place]) 11/21/2016  . Diabetic peripheral vascular disease ([place]) 11/21/2016  . DDD (degenerative disc disease), lumbar 03/27/2016  . History of deep vein thrombosis (DVT) of lower extremity 03/26/2016  . History of pulmonary embolism 10/01/2015  . Nephropathy, diabetic ([place]) 10/01/2015  . 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 Chronic obstructive pulmonary disease, unspecified ([place]) 12/16/2013  . Medication management 04/14/2013  . History of recurrent DVT/E 04/11/2013  . Sleep apnea 04/11/2013  . Bradycardia 04/11/2013  . History of Elevated CK level  04/11/2013  . Class 1 obesity due to excess calories with serious comorbidity and body mass index (BMI) of 34.0 to 34.9 in adult 04/11/2013  . Peripheral neuropathy ([place])   . Charcot's arthropathy   . Heart failure, unspecified ([place]) 04/08/2013  . Paroxysmal atrial fibrillation ([place]) 04/07/2013  . Reactive depression (situational) 05/16/2011  . Long term (current) use of anticoagulants 09/21/2010  . 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 ED (erectile dysfunction) of organic origin 07/28/2010  . Hyperlipidemia 05/26/2008  . Hypertensive heart disease 05/26/2008  . Diabetes ([place]) 02/14/1991    Past Surgical History:  Procedure Laterality Date  . CARDIOVERSION N/A 06/19/2018   Procedure: CARDIOVERSION;  Surgeon: Acie Fredrickson, Wonda Cheng, MD;  Location: [facility];  Service: Cardiovascular;  Laterality: N/A;  . CORONARY ANGIOPLASTY WITH STENT PLACEMENT    . CORONARY ARTERY BYPASS GRAFT     4 time since 2002  . LEFT HEART CATHETERIZATION WITH CORONARY/GRAFT ANGIOGRAM  04/05/2013   Procedure: LEFT HEART CATHETERIZATION WITH Beatrix Fetters;  Surgeon: Peter M Martinique, MD;  Location: [facility] CATH LAB;  Service: Cardiovascular;;  . left knee surgery    . LOWER EXTREMITY ANGIOGRAPHY Left  12/25/2016   Procedure: LOWER EXTREMITY ANGIOGRAPHY;  Surgeon: Algernon Huxley, MD;  Location: [facility] CV LAB;  Service: Cardiovascular;  Laterality: Left;  . PERCUTANEOUS CORONARY STENT INTERVENTION (PCI-S)  04/05/2013   Procedure: PERCUTANEOUS CORONARY STENT INTERVENTION (PCI-S);  Surgeon: Peter M Martinique, MD;  Location: [facility] CATH LAB;  Service: Cardiovascular;;  DES to native Mid cx  . VASCULAR SURGERY      Prior to Admission medications   Medication Sig Start Date End Date Taking? Authorizing Provider  acetaminophen (TYLENOL) 500 MG tablet Take 1,000 mg every 8 (eight) hours as needed by mouth for mild pain or headache.     [provider]  atorvastatin (LIPITOR) 20 MG tablet Take 1 tablet (20 mg total) by mouth daily. 04/03/18   Minus Breeding, MD  Blood Glucose Monitoring Suppl (FIFTY50 GLUCOSE METER 2.0) w/Device KIT Use as directed. 04/27/14   [provider]  cephALEXin (KEFLEX) 500 MG capsule Take 1 capsule (500 mg total) by mouth every 12 (twelve) hours. 06/20/18  Seawell, Jaimie A, DO  gabapentin (NEURONTIN) 300 MG capsule Take 600 mg by mouth at bedtime as needed (pain in foot and ankle).     [provider]  Insulin Glargine, 1 Unit Dial, (TOUJEO SOLOSTAR) 300 UNIT/ML SOPN Inject 70 Units into the skin every morning. Patient takes Toujeo 70 units QAM; last dose on 06/17/18    [provider]  insulin lispro (HUMALOG KWIKPEN) 100 UNIT/ML injection Inject 36 Units into the skin 3 (three) times daily before meals.     [provider]  Insulin Syringe-Needle U-100 31G X 5/16" 0.3 ML MISC use as directed 02/12/08   [provider]  isosorbide mononitrate (IMDUR) 30 MG 24 hr tablet Take 1 tablet (30 mg total) by mouth daily. 04/03/18   Minus Breeding, MD  losartan (COZAAR) 25 MG tablet Take 1 tablet (25 mg total) by mouth daily. 04/03/18   Minus Breeding, MD  metFORMIN (GLUCOPHAGE-XR) 500 MG 24 hr tablet Take 500 mg by mouth daily with  breakfast.    [provider]  metoprolol succinate (TOPROL-XL) 25 MG 24 hr tablet Take 0.5 tablets (12.5 mg total) by mouth daily. 04/03/18   Minus Breeding, MD  nitroGLYCERIN (NITROSTAT) 0.4 MG SL tablet Place 1 tablet (0.4 mg total) under the tongue every 5 (five) minutes as needed for chest pain. NEEDS APPOINTMENT 08/15/16   Burtis Junes, NP  pantoprazole (PROTONIX) 40 MG tablet TAKE 1 TABLET (40 MG TOTAL) BY MOUTH AS NEEDED (INDIGESTION). Patient taking differently: Take 40 mg by mouth every evening.  06/08/18   Minus Breeding, MD  potassium chloride (K-DUR) 10 MEQ tablet Take 10 mEq by mouth every morning.    [provider]  torsemide (DEMADEX) 20 MG tablet Take 2 tablets (40 mg total) by mouth 2 (two) times daily. 06/26/18 09/24/18  Minus Breeding, MD  warfarin (COUMADIN) 5 MG tablet TAKE 1 TABLET (5 MG TOTAL) BY MOUTH AS DIRECTED. TAKE AS DIRECTED BY THE COUMADIN CLINIC. 07/05/18   Minus Breeding, MD    Allergies Simvastatin  Family History  Problem Relation Age of Onset  . Heart disease Mother   . Hypertension Mother   . Cancer Father   . Hypertension Sister   . Heart attack Neg Hx   . Stroke Neg Hx     Social History Social History   Tobacco Use  . Smoking status: Never Smoker  . Smokeless tobacco: Never Used  Substance Use Topics  . Alcohol use: Yes    Comment: rare  . Drug use: No    Review of Systems  Constitutional: No fever. Eyes: No redness. ENT: No sore throat. Cardiovascular: Denies chest pain. Respiratory: Denies shortness of breath. Gastrointestinal: No vomiting. Genitourinary: Negative for dysuria.  Musculoskeletal: Negative for back pain. Skin: Negative for rash. Neurological: Negative for headache.   ____________________________________________   PHYSICAL EXAM:  VITAL SIGNS: ED Triage Vitals [07/09/18 2153]  Enc Vitals Group     BP 135/63     Pulse Rate (!) 59     Resp 20     Temp 98.6 F (37 C)     Temp Source  Oral     SpO2 94 %     Weight 261 lb 0.4 oz (118.4 kg)     Height 6' (1.829 m)     Head Circumference      Peak Flow      Pain Score 0     Pain Loc      Pain Edu?  Excl. in [place]?     Constitutional: Alert and oriented. Well appearing and in no acute distress. Eyes: Conjunctivae are normal.  Head: Atraumatic. Nose: No congestion/rhinnorhea. Mouth/Throat: Mucous membranes are moist.   Neck: Normal range of motion.  Cardiovascular: Normal rate, regular rhythm. Good peripheral circulation. Respiratory: Normal respiratory effort. Gastrointestinal: No distention.  Musculoskeletal: No lower extremity edema.  Extremities warm and well perfused.  Neurologic:  Normal speech and language. No gross focal neurologic deficits are appreciated.  Skin:  Skin is warm and dry. No rash noted.  Dressing in place on mid back with no bleeding or tenderness. Psychiatric: Mood and affect are normal. Speech and behavior are normal.  ____________________________________________   LABS (all labs ordered are listed, but only abnormal results are displayed)  Labs Reviewed  COMPREHENSIVE METABOLIC PANEL - Abnormal; Notable for the following components:      Result Value   Glucose, Bld 280 (*)    BUN 26 (*)    Creatinine, Ser 1.40 (*)    GFR calc non Af Amer 53 (*)    All other components within normal limits  PROTIME-INR - Abnormal; Notable for the following components:   Prothrombin Time 28.4 (*)    INR 2.7 (*)    All other components within normal limits  CBC WITH DIFFERENTIAL/PLATELET   ____________________________________________  EKG   ____________________________________________  RADIOLOGY    ____________________________________________   PROCEDURES  Procedure(s) performed: No  Procedures  Critical Care performed: No ____________________________________________   INITIAL IMPRESSION / ASSESSMENT AND PLAN / ED COURSE  Pertinent labs & imaging results that were available  during my care of the patient were reviewed by me and considered in my medical decision making (see chart for details).  63 year old male with PMH as noted above and who is on Coumadin presents for evaluation after an episode of postprocedural bleeding.  He had a cyst removed from his back by his dermatologist Dr. Nehemiah Massed earlier this afternoon.  Due to persistent bleeding, Dr. Nehemiah Massed had to cauterize the wound, but he was concerned for possible supratherapeutic INR and wanted him evaluated in the emergency department.  At this time, the patient has no further bleeding and a dressing is in place.  His vital signs are normal.  He has no symptoms at this time.  Lab work-up reveals therapeutic INR and normal H/H.  I discussed the case with Dr. Nehemiah Massed over the phone.  I did not remove the dressing or examine the wound directly since the bleeding is currently under control and I thought it would be best not to further disturb the wound.  Dr. Drusilla Kanner agreed with this.  He is seeing the patient tomorrow morning at 1130 and agrees with discharge home.  I gave the patient the return precautions and he expressed understanding.  He is stable for discharge at this time.   ____________________________________________   FINAL CLINICAL IMPRESSION(S) / ED DIAGNOSES  Final diagnoses:  Bleeding from wound      NEW MEDICATIONS STARTED DURING THIS VISIT:  New Prescriptions   No medications on file     Note:  This document was prepared using Dragon voice recognition software and may include unintentional dictation errors.    Arta Silence, MD 07/09/18 [phone]

## 2018-07-09 NOTE — Discharge Instructions (Addendum)
Follow-up with Dr. Nehemiah Massed tomorrow as planned.  Return to the ER immediately for any new, worsening, or recurrent bleeding, weakness or lightheadedness, or any other new or worsening symptoms that concern you.

## 2018-07-09 NOTE — ED Triage Notes (Signed)
Patient to ER with bleeding from cyst on back (middle). Bleeding now controlled, but was sent by Dr. Alveria Apley office d/t level of bleeding and patient being on blood thinners. Did not have levels checked prior to having cyst removed.

## 2018-07-09 NOTE — Telephone Encounter (Signed)
Spoke with wife and she stated office doing procedure did not think necessary to hold

## 2018-07-09 NOTE — ED Notes (Signed)
Pt to room 3 via w/c with no distress noted; pt reports cyst removed from mid upper back at 2pm today with Dr Erin Fulling; pt reports site was bleeding, returned immed to office and site was cauterized and redressed; reports site has continued to bleed; currently taking coumadin; dressing D&I, pt with no c/o

## 2018-07-10 ENCOUNTER — Telehealth: Payer: Self-pay | Admitting: Cardiology

## 2018-07-10 ENCOUNTER — Ambulatory Visit (INDEPENDENT_AMBULATORY_CARE_PROVIDER_SITE_OTHER): Payer: Managed Care, Other (non HMO) | Admitting: Cardiology

## 2018-07-10 DIAGNOSIS — Z7901 Long term (current) use of anticoagulants: Secondary | ICD-10-CM | POA: Diagnosis not present

## 2018-07-10 DIAGNOSIS — Z86718 Personal history of other venous thrombosis and embolism: Secondary | ICD-10-CM

## 2018-07-10 DIAGNOSIS — I480 Paroxysmal atrial fibrillation: Secondary | ICD-10-CM

## 2018-07-10 DIAGNOSIS — Z86711 Personal history of pulmonary embolism: Secondary | ICD-10-CM | POA: Diagnosis not present

## 2018-07-10 NOTE — Telephone Encounter (Signed)
Pt was in the ED last night and states his level was 2.7. He was told to cut back on his Warfarin. Please advise.

## 2018-07-10 NOTE — Patient Instructions (Signed)
Description   Spoke with wife and instructed pt to hold tomorrow's dose as their MD instructed to have stitches removed then continue 2 tablets daily except 1 tablet on Mondays and Fridays. Recheck in 1 week.

## 2018-07-10 NOTE — Telephone Encounter (Signed)
Returned call to the pt and spoke with wife, who is on the [place]. Please refer to Anticoagulation Encounter.

## 2018-07-15 ENCOUNTER — Telehealth: Payer: Self-pay

## 2018-07-15 NOTE — Telephone Encounter (Signed)
Left message for pt reminding him of appt on Wed in the office, he will need to enter thru the [facility] entrance of [facility]. Asked him to call back to r/s if he answers yes to any of the following questions:  1. COVID-19 Pre-Screening Questions:  . In the past 7 to 10 days have you had a cough,  shortness of breath, headache, congestion, fever (100 or greater) body aches, chills, sore throat, or sudden loss of taste or sense of smell?   . Have you been around anyone with known Covid 19.   . Have you been around anyone who is awaiting Covid 19 test results in the past 7 to 10 days?  . Have you been around anyone who has been exposed to Covid 19, or has mentioned symptoms of Covid 19 within the past 7 to 10 days?    2. Pt advised of visitor restrictions (no visitors allowed except if needed to conduct the visit). Also advised to arrive at appointment time and wear a mask.     

## 2018-07-16 ENCOUNTER — Other Ambulatory Visit: Payer: Self-pay

## 2018-07-16 ENCOUNTER — Telehealth: Payer: Self-pay | Admitting: Cardiology

## 2018-07-16 ENCOUNTER — Telehealth: Payer: Self-pay

## 2018-07-16 NOTE — Telephone Encounter (Signed)
Received call- sent information to MD and assistant in another telephone message.

## 2018-07-16 NOTE — Telephone Encounter (Signed)
New Message   Bobby Lindsey from [facility] calling to give abnormal Zio patch results which were:  Slow Afib 38 bpm and 3.8 sec pauses

## 2018-07-16 NOTE — Telephone Encounter (Signed)
Received call from [facility]- she states that the patient had slow Afib- 38 bpm for 1 minute.  10 pauses- longest lasting 3.8 seconds. Monitor was for 4 days- and he was in Afib 89% of the time.   Advised I would to MD to make aware. Report should be available to view.

## 2018-07-17 ENCOUNTER — Ambulatory Visit (INDEPENDENT_AMBULATORY_CARE_PROVIDER_SITE_OTHER): Payer: Managed Care, Other (non HMO)

## 2018-07-17 ENCOUNTER — Other Ambulatory Visit: Payer: Self-pay

## 2018-07-17 DIAGNOSIS — Z86718 Personal history of other venous thrombosis and embolism: Secondary | ICD-10-CM

## 2018-07-17 DIAGNOSIS — Z5181 Encounter for therapeutic drug level monitoring: Secondary | ICD-10-CM | POA: Diagnosis not present

## 2018-07-17 DIAGNOSIS — I480 Paroxysmal atrial fibrillation: Secondary | ICD-10-CM

## 2018-07-17 DIAGNOSIS — I2119 ST elevation (STEMI) myocardial infarction involving other coronary artery of inferior wall: Secondary | ICD-10-CM | POA: Diagnosis not present

## 2018-07-17 DIAGNOSIS — Z86711 Personal history of pulmonary embolism: Secondary | ICD-10-CM

## 2018-07-17 DIAGNOSIS — Z7901 Long term (current) use of anticoagulants: Secondary | ICD-10-CM

## 2018-07-17 LAB — POCT INR: INR: 1.7 — AB (ref 2.0–3.0)

## 2018-07-17 NOTE — Patient Instructions (Signed)
Please take 3 tablets tonight, then continue 2 tablets daily except 1 tablet on Mondays and Fridays. Recheck in 4 weeks.

## 2018-07-31 ENCOUNTER — Other Ambulatory Visit: Payer: Self-pay | Admitting: Cardiology

## 2018-08-12 ENCOUNTER — Telehealth: Payer: Self-pay

## 2018-08-12 NOTE — Progress Notes (Signed)
Cardiology Office Note   Date:  08/13/2018   ID:  Bobby Lindsey September 11, 1955, MRN [id number]  PCP:  Adin Hector, MD  Cardiologist:   Minus Breeding, MD   Chief Complaint  Patient presents with  . Edema     History of Present Illness: Bobby Lindsey is a 63 y.o. male who presents for follow up of acute on chronic diastolic HF.  In May he was in the hospital for this.  I reviewed these records for this visit.    He presented with volume overload and was in atrial flutter.  Echo was done and his EF was 60 - 65%.  There were no significant valvular abnormalities.    He was also treated for cellulitis.  He did have cardioversion.  Of note he had a slow ventricular rate.  He had diuresis.  Unfortunately he does not know what his weight was after he had been diuresed and when he got home.   I saw him virtually following this.  He had a ZIO patch and he had atrial fib with slow ventricular rates.  He had pauses during sleep with a max pause of 3.8 sec  Since that visit.  He is had no acute problems.  He is not weighing himself daily but his wife does report that he is watching his salt.  He feels fatigued.  He is not having any new shortness of breath, PND or orthopnea.  He thinks his leg swelling is usually pretty good.  He is not describing any chest pressure, neck or arm discomfort.  He does not notice that his heart is out of rhythm.  He is not having any palpitations, presyncope or syncope.   Past Medical History:  Diagnosis Date  . Anginal pain ([place])    last pm  . Atrial fibrillation ([place])    a. Transient during 03/2013 admission.  . Bradycardia    a. Bradycardia/pauses/possible Mobitz II during 03/2013 adm. Not on BB due to this.  Marland Kitchen CAD (coronary artery disease)    a. s/p CABG 2002. b. Hx Cypher stent to the RCA. c. Inf-lat STEMI 03/2013:  LHC (04/05/13):  mLAD occluded, pD1 90, apical br of Dx occluded, CFX occluded, pOM1 90-95, RCA stents patent, diff RCA 30, S-Dx occluded,  S-PDA occluded, S-OM1 40-50, L-LAD patent, EF 40% with inf HK.  PCI:  Promus (2.5 x 28) DES to mid to dist CFX.  Marland Kitchen Charcot's joint of knee   . COPD (chronic obstructive pulmonary disease) ([place])   . Deep venous thrombosis ([facility])    right lower extremity  . Diabetes mellitus    a. A1C 10.7 in 03/2013.  . Diastolic CHF ([place])    a. EF 40% by cath, 55-60% during 03/2013 adm, required IV diuresis.  . DVT, lower extremity, recurrent ([place])    a. Hx recurrent DVT per record.  . Dyslipidemia   . Elevated CK    a. Pt has refused rheum workup in the past.  . GERD (gastroesophageal reflux disease)   . History of hiatal hernia   . HTN (hypertension)    x 15 years  . Hx of cardiovascular stress test    a. Lexiscan Myoview (03/2010):  diaph atten vs inf scar, no ischemia, EF 47%; Low Risk.  Marland Kitchen Hx of echocardiogram    a. Echo (04/08/13):  Mild LVH, EF 55-60%, restrictive physiology, severe LAE, mild reduced RVSF, mild RAE.  . Leg pain    ABI 6/16:  R 1.2, L 1.1 - normal  . Myocardial infarction ([place])   . Obesity   . Peripheral neuropathy   . Pulmonary embolism ([place])   . Sleep apnea     Past Surgical History:  Procedure Laterality Date  . CARDIOVERSION N/A 06/19/2018   Procedure: CARDIOVERSION;  Surgeon: Acie Fredrickson, Wonda Cheng, MD;  Location: [facility];  Service: Cardiovascular;  Laterality: N/A;  . CORONARY ANGIOPLASTY WITH STENT PLACEMENT    . CORONARY ARTERY BYPASS GRAFT     4 time since 2002  . LEFT HEART CATHETERIZATION WITH CORONARY/GRAFT ANGIOGRAM  04/05/2013   Procedure: LEFT HEART CATHETERIZATION WITH Beatrix Fetters;  Surgeon: Peter M Martinique, MD;  Location: [facility] CATH LAB;  Service: Cardiovascular;;  . left knee surgery    . LOWER EXTREMITY ANGIOGRAPHY Left 12/25/2016   Procedure: LOWER EXTREMITY ANGIOGRAPHY;  Surgeon: Algernon Huxley, MD;  Location: [facility] CV LAB;  Service: Cardiovascular;  Laterality: Left;  . PERCUTANEOUS CORONARY STENT INTERVENTION (PCI-S)  04/05/2013    Procedure: PERCUTANEOUS CORONARY STENT INTERVENTION (PCI-S);  Surgeon: Peter M Martinique, MD;  Location: [facility] CATH LAB;  Service: Cardiovascular;;  DES to native Mid cx  . VASCULAR SURGERY       Current Outpatient Medications  Medication Sig Dispense Refill  . acetaminophen (TYLENOL) 500 MG tablet Take 1,000 mg every 8 (eight) hours as needed by mouth for mild pain or headache.     Marland Kitchen atorvastatin (LIPITOR) 20 MG tablet Take 1 tablet (20 mg total) by mouth daily. 90 tablet 3  . Blood Glucose Monitoring Suppl (FIFTY50 GLUCOSE METER 2.0) w/Device KIT Use as directed.    . gabapentin (NEURONTIN) 300 MG capsule Take 600 mg by mouth at bedtime as needed (pain in foot and ankle).     . Insulin Glargine, 1 Unit Dial, (TOUJEO SOLOSTAR) 300 UNIT/ML SOPN Inject 70 Units into the skin every morning. Patient takes Toujeo 70 units QAM; last dose on 06/17/18    . insulin lispro (HUMALOG KWIKPEN) 100 UNIT/ML injection Inject 36 Units into the skin 3 (three) times daily before meals.     . Insulin Syringe-Needle U-100 31G X 5/16" 0.3 ML MISC use as directed    . isosorbide mononitrate (IMDUR) 30 MG 24 hr tablet Take 1 tablet (30 mg total) by mouth daily. 90 tablet 3  . losartan (COZAAR) 25 MG tablet Take 1 tablet (25 mg total) by mouth daily. 90 tablet 3  . metFORMIN (GLUCOPHAGE-XR) 500 MG 24 hr tablet Take 500 mg by mouth daily with breakfast.    . metoprolol succinate (TOPROL-XL) 25 MG 24 hr tablet Take 0.5 tablets (12.5 mg total) by mouth daily. 90 tablet 3  . nitroGLYCERIN (NITROSTAT) 0.4 MG SL tablet Place 1 tablet (0.4 mg total) under the tongue every 5 (five) minutes as needed for chest pain. NEEDS APPOINTMENT 25 tablet 6  . pantoprazole (PROTONIX) 40 MG tablet TAKE 1 TABLET (40 MG TOTAL) BY MOUTH AS NEEDED (INDIGESTION). (Patient taking differently: Take 40 mg by mouth every evening. ) 90 tablet 1  . potassium chloride (K-DUR) 10 MEQ tablet Take 10 mEq by mouth every morning.    . torsemide (DEMADEX) 20 MG  tablet Take 40 mg by mouth daily.    Marland Kitchen warfarin (COUMADIN) 5 MG tablet TAKE 1 TABLET (5 MG TOTAL) BY MOUTH AS DIRECTED. TAKE AS DIRECTED BY THE COUMADIN CLINIC. 60 tablet 0   No current facility-administered medications for this visit.     Allergies:   Simvastatin  ROS:  Please see the history of present illness.   Otherwise, review of systems are positive for none.   All other systems are reviewed and negative.    PHYSICAL EXAM: VS:  BP 128/75   Pulse 68   Temp 98.6 F (37 C)   Ht 6' (1.829 m)   Wt 268 lb (121.6 kg)   BMI 36.35 kg/m  , BMI Body mass index is 36.35 kg/m. GENERAL:  Well appearing HEENT:  Pupils equal round and reactive, fundi not visualized, oral mucosa unremarkable NECK:  No jugular venous distention, waveform within normal limits, carotid upstroke brisk and symmetric, no bruits, no thyromegaly LYMPHATICS:  No cervical, inguinal adenopathy LUNGS:  Clear to auscultation bilaterally BACK:  No CVA tenderness CHEST:  Well healed sternotomy scar.] HEART:  PMI not displaced or sustained,S1 and S2 within normal limits, no S3, no clicks, no rubs, no murmurs, irregular.  ABD:  Flat, positive bowel sounds normal in frequency in pitch, no bruits, no rebound, no guarding, no midline pulsatile mass, no hepatomegaly, no splenomegaly EXT:  2 plus pulses throughout, moderate right edema, no cyanosis no clubbing, status post left BKA.      EKG:  EKG is not ordered today.    Recent Labs: 06/17/2018: B Natriuretic Peptide 307.1; TSH 3.144 06/20/2018: Magnesium 2.1 07/09/2018: ALT 30; BUN 26; Creatinine, Ser 1.40; Hemoglobin 14.3; Platelets 175; Potassium 4.7; Sodium 139    Lipid Panel    Component Value Date/Time   CHOL 123 04/18/2018 0833   TRIG 117 04/18/2018 0833   HDL 37 (L) 04/18/2018 0833   CHOLHDL 3.3 04/18/2018 0833   CHOLHDL 3.6 CALC 03/11/2008 0930   VLDL 21 03/11/2008 0930   LDLCALC 63 04/18/2018 0833      Wt Readings from Last 3 Encounters:  08/13/18  268 lb (121.6 kg)  07/09/18 261 lb 0.4 oz (118.4 kg)  06/26/18 261 lb (118.4 kg)      Other studies Reviewed: Additional studies/ records that were reviewed today include: None. Review of the above records demonstrates:     ASSESSMENT AND PLAN:  ACUTE ON CHRONIC DIASTOLIC HF:    We talked again about PRN dosing of his diuretic.  I am going to check a basic metabolic profile today.  I reinforced the need to weigh himself daily.  CAD:   The patient has no new sypmtoms.  No further cardiovascular testing is indicated.  We will continue with aggressive risk reduction and meds as listed.  WGY:KZLDJ pressure is controlled.  Continue meds as listed.  HYPERLIPIDEMIA: LDL was as above and at target.  No change in therapy.   DM:A1c was 10.1.This is managed per Adin Hector, MD  OSA:He agrees to have a sleep study and we will make this consult. p study.   ATRIAL FLUTTER:   He does have slow rates when he is asleep but otherwise does not notice this rhythm.  We will check his Coumadin today.  No change in therapy.  I do not think his arrhythmias contributing to any volume issues or symptoms.   Current medicines are reviewed at length with the patient today.  The patient does not have concerns regarding medicines.  The following changes have been made:  no change  Labs/ tests ordered today include:   Orders Placed This Encounter  Procedures  . Basic metabolic panel  . Split night study     Disposition:   FU with APP or me in 2 months in the office.  Signed, Minus Breeding, MD  08/13/2018 2:56 PM    [facility]

## 2018-08-12 NOTE — Telephone Encounter (Signed)
Attempted to contact pt to prescreen for COVID19 prior to appt for INR check on Wed, 7/1. Left message on his vm advising him of his appt time and that he will need to enter thru the [place] entrance of [facility] and wear a mask.  Asked him to call back w/ any questions or concerns.

## 2018-08-13 ENCOUNTER — Ambulatory Visit: Payer: Managed Care, Other (non HMO) | Admitting: Cardiology

## 2018-08-13 ENCOUNTER — Encounter: Payer: Self-pay | Admitting: Cardiology

## 2018-08-13 ENCOUNTER — Ambulatory Visit (INDEPENDENT_AMBULATORY_CARE_PROVIDER_SITE_OTHER): Payer: Managed Care, Other (non HMO) | Admitting: Pharmacist

## 2018-08-13 ENCOUNTER — Other Ambulatory Visit: Payer: Self-pay

## 2018-08-13 VITALS — BP 128/75 | HR 68 | Temp 98.6°F | Ht 72.0 in | Wt 268.0 lb

## 2018-08-13 DIAGNOSIS — Z5181 Encounter for therapeutic drug level monitoring: Secondary | ICD-10-CM

## 2018-08-13 DIAGNOSIS — I10 Essential (primary) hypertension: Secondary | ICD-10-CM

## 2018-08-13 DIAGNOSIS — G4733 Obstructive sleep apnea (adult) (pediatric): Secondary | ICD-10-CM

## 2018-08-13 DIAGNOSIS — I2119 ST elevation (STEMI) myocardial infarction involving other coronary artery of inferior wall: Secondary | ICD-10-CM

## 2018-08-13 DIAGNOSIS — I480 Paroxysmal atrial fibrillation: Secondary | ICD-10-CM

## 2018-08-13 DIAGNOSIS — I4891 Unspecified atrial fibrillation: Secondary | ICD-10-CM | POA: Diagnosis not present

## 2018-08-13 DIAGNOSIS — Z86718 Personal history of other venous thrombosis and embolism: Secondary | ICD-10-CM

## 2018-08-13 DIAGNOSIS — Z86711 Personal history of pulmonary embolism: Secondary | ICD-10-CM

## 2018-08-13 DIAGNOSIS — I5032 Chronic diastolic (congestive) heart failure: Secondary | ICD-10-CM

## 2018-08-13 DIAGNOSIS — Z7901 Long term (current) use of anticoagulants: Secondary | ICD-10-CM

## 2018-08-13 LAB — POCT INR: INR: 2.5 (ref 2.0–3.0)

## 2018-08-13 NOTE — Patient Instructions (Addendum)
Medication Instructions:  Your physician recommends that you continue on your current medications as directed. Please refer to the Current Medication list given to you today.  If you need a refill on your cardiac medications before your next appointment, please call your pharmacy.   Lab work: BMET TODAY   If you have labs (blood work) drawn today and your tests are completely normal, you will receive your results only by: Marland Kitchen MyChart Message (if you have MyChart) OR . A paper copy in the mail If you have any lab test that is abnormal or we need to change your treatment, we will call you to review the results.  Testing/Procedures: Your physician has recommended that you have a sleep study. This test records several body functions during sleep, including: brain activity, eye movement, oxygen and carbon dioxide blood levels, heart rate and rhythm, breathing rate and rhythm, the flow of air through your mouth and nose, snoring, body muscle movements, and chest and belly movement. THE OFFICE WILL CALL YOU WITH DATE AND TIME    Follow-Up: Your physician recommends that you schedule a follow-up appointment in: 2 MONTHS WITH H MENG PA  10/14/18 AT 1:30

## 2018-08-14 LAB — BASIC METABOLIC PANEL
BUN/Creatinine Ratio: 22 (ref 10–24)
BUN: 23 mg/dL (ref 8–27)
CO2: 25 mmol/L (ref 20–29)
Calcium: 8.9 mg/dL (ref 8.6–10.2)
Chloride: 100 mmol/L (ref 96–106)
Creatinine, Ser: 1.04 mg/dL (ref 0.76–1.27)
GFR calc Af Amer: 88 mL/min/{1.73_m2} (ref >59)
GFR calc non Af Amer: 76 mL/min/{1.73_m2} (ref >59)
Glucose: 245 mg/dL — ABNORMAL HIGH (ref 65–99)
Potassium: 4.4 mmol/L (ref 3.5–5.2)
Sodium: 140 mmol/L (ref 134–144)

## 2018-08-19 ENCOUNTER — Telehealth: Payer: Self-pay | Admitting: *Deleted

## 2018-08-19 NOTE — Telephone Encounter (Signed)
Patient was in office last week and Dr Percival Spanish ordered sleep study. Advised patient today COVID 19 test would need to be be done few days prior to sleep study. Will order and schedule once the sleep study has been scheduled

## 2018-08-21 ENCOUNTER — Telehealth: Payer: Self-pay | Admitting: *Deleted

## 2018-08-21 NOTE — Telephone Encounter (Signed)
PA submitted to [facility] via web portal for split night  sleep study.

## 2018-08-21 NOTE — Telephone Encounter (Signed)
-----   Message from Earvin Hansen, LPN sent at 04/16/4354  4:57 PM EDT ----- Geronimo Boot I think I sent you message on him needing a sleep study but cant remember. I did forget to tell him about needing COVID testing prior but told him today. I did not put the order in yet because I had done that for another patient and [facility] called him to schedule and don't want him to get scheduled too early. Anyway you let me know when scheduled and I will put orders in and get scheduled  Thanks Rip Harbour

## 2018-08-22 ENCOUNTER — Other Ambulatory Visit: Payer: Self-pay | Admitting: Cardiology

## 2018-09-11 ENCOUNTER — Ambulatory Visit (INDEPENDENT_AMBULATORY_CARE_PROVIDER_SITE_OTHER): Payer: Managed Care, Other (non HMO)

## 2018-09-11 ENCOUNTER — Other Ambulatory Visit: Payer: Self-pay

## 2018-09-11 DIAGNOSIS — Z5181 Encounter for therapeutic drug level monitoring: Secondary | ICD-10-CM

## 2018-09-11 DIAGNOSIS — Z86711 Personal history of pulmonary embolism: Secondary | ICD-10-CM

## 2018-09-11 DIAGNOSIS — Z86718 Personal history of other venous thrombosis and embolism: Secondary | ICD-10-CM | POA: Diagnosis not present

## 2018-09-11 DIAGNOSIS — I480 Paroxysmal atrial fibrillation: Secondary | ICD-10-CM

## 2018-09-11 DIAGNOSIS — Z7901 Long term (current) use of anticoagulants: Secondary | ICD-10-CM

## 2018-09-11 DIAGNOSIS — I2119 ST elevation (STEMI) myocardial infarction involving other coronary artery of inferior wall: Secondary | ICD-10-CM | POA: Diagnosis not present

## 2018-09-11 LAB — POCT INR: INR: 2.3 (ref 2.0–3.0)

## 2018-09-11 NOTE — Patient Instructions (Signed)
Continue 2 tablets daily except 1 tablet on Mondays and Fridays. Recheck in 5 weeks.

## 2018-09-15 ENCOUNTER — Other Ambulatory Visit: Payer: Self-pay | Admitting: Cardiology

## 2018-10-08 ENCOUNTER — Other Ambulatory Visit: Payer: Self-pay | Admitting: Cardiology

## 2018-10-08 NOTE — Telephone Encounter (Signed)
Please review for refill. Thank you! 

## 2018-10-11 ENCOUNTER — Telehealth: Payer: Self-pay

## 2018-10-11 NOTE — Telephone Encounter (Signed)
Left a detailed message for the patient on both his mobile and home number listed for the patient and on the number listed for Amy Raffetto about rescheduling his upcoming appointment with Almyra Deforest, PA-C on 10/14/18 at 1:30PM to a different day and time and to please give our office a call and someone will assist him.

## 2018-10-14 ENCOUNTER — Ambulatory Visit: Payer: Managed Care, Other (non HMO) | Admitting: Physician Assistant

## 2018-10-16 ENCOUNTER — Other Ambulatory Visit: Payer: Self-pay

## 2018-10-16 ENCOUNTER — Ambulatory Visit (INDEPENDENT_AMBULATORY_CARE_PROVIDER_SITE_OTHER): Payer: Managed Care, Other (non HMO)

## 2018-10-16 DIAGNOSIS — I2119 ST elevation (STEMI) myocardial infarction involving other coronary artery of inferior wall: Secondary | ICD-10-CM

## 2018-10-16 DIAGNOSIS — Z5181 Encounter for therapeutic drug level monitoring: Secondary | ICD-10-CM | POA: Diagnosis not present

## 2018-10-16 DIAGNOSIS — Z86718 Personal history of other venous thrombosis and embolism: Secondary | ICD-10-CM | POA: Diagnosis not present

## 2018-10-16 DIAGNOSIS — Z86711 Personal history of pulmonary embolism: Secondary | ICD-10-CM

## 2018-10-16 DIAGNOSIS — Z7901 Long term (current) use of anticoagulants: Secondary | ICD-10-CM | POA: Diagnosis not present

## 2018-10-16 DIAGNOSIS — I480 Paroxysmal atrial fibrillation: Secondary | ICD-10-CM

## 2018-10-16 LAB — POCT INR: INR: 3.1 — AB (ref 2.0–3.0)

## 2018-10-16 NOTE — Patient Instructions (Signed)
Have a serving of greens tonight, and continue Dr. Olin Pia dosage of 9 mg every day. Recheck in 2 weeks.

## 2018-10-18 ENCOUNTER — Telehealth: Payer: Self-pay | Admitting: *Deleted

## 2018-10-18 NOTE — Telephone Encounter (Signed)
Received a call from [place] me patient's sleep study submitted back in July has been denied. Dr Percival Spanish will need to call  [phone] and do a peer to peer. Message sent back to [place]. This message will be routed to Dr Percival Spanish.

## 2018-10-29 ENCOUNTER — Encounter: Payer: Self-pay | Admitting: Physician Assistant

## 2018-10-29 ENCOUNTER — Other Ambulatory Visit: Payer: Self-pay

## 2018-10-29 ENCOUNTER — Ambulatory Visit: Payer: Managed Care, Other (non HMO) | Admitting: Physician Assistant

## 2018-10-29 VITALS — BP 146/70 | HR 69 | Temp 98.4°F | Ht 72.0 in | Wt 267.0 lb

## 2018-10-29 DIAGNOSIS — I10 Essential (primary) hypertension: Secondary | ICD-10-CM | POA: Diagnosis not present

## 2018-10-29 DIAGNOSIS — I480 Paroxysmal atrial fibrillation: Secondary | ICD-10-CM

## 2018-10-29 DIAGNOSIS — Z794 Long term (current) use of insulin: Secondary | ICD-10-CM

## 2018-10-29 DIAGNOSIS — I5032 Chronic diastolic (congestive) heart failure: Secondary | ICD-10-CM

## 2018-10-29 DIAGNOSIS — I25810 Atherosclerosis of coronary artery bypass graft(s) without angina pectoris: Secondary | ICD-10-CM | POA: Diagnosis not present

## 2018-10-29 DIAGNOSIS — E785 Hyperlipidemia, unspecified: Secondary | ICD-10-CM

## 2018-10-29 DIAGNOSIS — E119 Type 2 diabetes mellitus without complications: Secondary | ICD-10-CM

## 2018-10-29 NOTE — Patient Instructions (Signed)
Medication Instructions:   Continue to take the Torsemide AS NEEDED in the afternoons   If you need a refill on your cardiac medications before your next appointment, please call your pharmacy.   Lab work:  You will need labs (blood work) drawn today:  BMET If you have labs (blood work) drawn today and your tests are completely normal, you will receive your results only by: Marland Kitchen MyChart Message (if you have MyChart) OR . A paper copy in the mail If you have any lab test that is abnormal or we need to change your treatment, we will call you to review the results.  Testing/Procedures: NONE ordered at this time of appointment   Follow-Up: At [facility], you and your health needs are our priority.  As part of our continuing mission to provide you with exceptional heart care, we have created designated Provider Care Teams.  These Care Teams include your primary Cardiologist (physician) and Advanced Practice Providers (APPs -  Physician Assistants and Nurse Practitioners) who all work together to provide you with the care you need, when you need it. . You will need a follow up appointment in 3 months with Minus Breeding, MD  Any Other Special Instructions Will Be Listed Below (If Applicable).

## 2018-10-29 NOTE — Progress Notes (Signed)
Cardiology Office Note    Date:  10/30/2018   ID:  Bobby Lindsey, DOB 1955/06/22, MRN [id number]  PCP:  Adin Hector, MD  Cardiologist:  Dr. Percival Spanish   Chief Complaint  Patient presents with   Follow-up    seen for Dr. Percival Spanish    History of Present Illness:  Bobby Lindsey is a 63 y.o. male with PMH of CAD s/p CABG 2002 and DES to RCA, recurrent PE/DVT, HTN, HLD, DM, PAF and OSA. He was admitted in February 2015 with inferior STEMI and underwent emergent cardiac catheterization. This demonstrated patent SVG to OM1 and LIMA to LAD, occluded SVG to diagonal and SVG to PDA. EF 40% with inferior wall abnormality. Echo showed normal LV EF prior to discharge. Culprit was felt to be occluded native left circumflex which was treated with drug-eluting stent. Hospitalization complicated by transient atrial fibrillation and acute on chronic diastolic heart failure. He has a history of elevated CK and remain off of statin. After he was started on amiodarone for A. fib, he developed Mobitz II AV block, beta blocker was discontinued. He later self discontinued amiodarone thinking it was causing dyspnea. Plavix was discontinued in September 2016.   He was treated for CHF exacerbation in August 2018.  Lasix was increased.  He was readmitted in October 2018 with shortness of breath and weakness.  Her troponin was elevated and it was felt to be demand ischemia in the setting of URI and the lower extremity cellulitis.  Discharge weight was 256 pounds.  Repeat echocardiogram obtained on 11/24/2016 showed EF 55 to 60%, severe LAE, mild focal basal hypertrophy of the septum, PA peak pressure 35 mmHg.  He had PCI of his left mid to distal anterior tibial artery in November 2018.  He later underwent left BKA at [facility].  More recently, patient was admitted with worsening lower extremity edema and drainage in May 2020 despite increasing Lasix to 160 mg daily.  CTA of the chest was negative  for PE.  Venous Doppler negative.  Beta-blocker discontinued given bradycardia.  He underwent IV diuresis and antibiotic treatment.  EKG shows he is in atrial flutter and eventually underwent cardioversion on 06/19/2018.  He was discharged on 80 mg daily of Lasix.  At discharge weight was 251 pounds. Zio patch after discharge demonstrated recurrent A. fib with slow ventricular rate.  He also had a pauses during sleep was maximal pause of 3.8 seconds.  Sleep study was recommended.  Patient presents today along with his wife.  His blood pressure is elevated, however he just took his blood pressure medication prior to arrival.  His sleep study was denied by insurance and is currently waiting for peer-to-peer phone call from Dr. Percival Spanish.  Otherwise, he has 1+ right lower extremity pitting edema.  His wife is managing his medication.  He is taking 40 mg daily of torsemide and has a additional 20 mg in the afternoon on a as needed basis for increasing edema.  She will need to use extra dose of torsemide for both today and tomorrow.  Otherwise she says she typically use extra dose of torsemide about 2 days a week.  Otherwise he denies any chest pain, orthopnea or PND.   Past Medical History:  Diagnosis Date   Anginal pain ([place])    last pm   Atrial fibrillation ([place])    a. Transient during 03/2013 admission.   Bradycardia    a. Bradycardia/pauses/possible Mobitz II during 03/2013  adm. Not on BB due to this.   CAD (coronary artery disease)    a. s/p CABG 2002. b. Hx Cypher stent to the RCA. c. Inf-lat STEMI 03/2013:  LHC (04/05/13):  mLAD occluded, pD1 90, apical br of Dx occluded, CFX occluded, pOM1 90-95, RCA stents patent, diff RCA 30, S-Dx occluded, S-PDA occluded, S-OM1 40-50, L-LAD patent, EF 40% with inf HK.  PCI:  Promus (2.5 x 28) DES to mid to dist CFX.   Charcot's joint of knee    COPD (chronic obstructive pulmonary disease) (HCC)    Deep venous thrombosis ([place])    right lower extremity    Diabetes mellitus    a. A1C 10.7 in 03/2013.   Diastolic CHF ([place])    a. EF 40% by cath, 55-60% during 03/2013 adm, required IV diuresis.   DVT, lower extremity, recurrent ([place])    a. Hx recurrent DVT per record.   Dyslipidemia    Elevated CK    a. Pt has refused rheum workup in the past.   GERD (gastroesophageal reflux disease)    History of hiatal hernia    HTN (hypertension)    x 15 years   Hx of cardiovascular stress test    a. Lexiscan Myoview (03/2010):  diaph atten vs inf scar, no ischemia, EF 47%; Low Risk.   Hx of echocardiogram    a. Echo (04/08/13):  Mild LVH, EF 55-60%, restrictive physiology, severe LAE, mild reduced RVSF, mild RAE.   Leg pain    ABI 6/16:  R 1.2, L 1.1 - normal   Myocardial infarction (HCC)    Obesity    Peripheral neuropathy    Pulmonary embolism ([place])    Sleep apnea     Past Surgical History:  Procedure Laterality Date   CARDIOVERSION N/A 06/19/2018   Procedure: CARDIOVERSION;  Surgeon: Nahser, Wonda Cheng, MD;  Location: [place] ENDOSCOPY;  Service: Cardiovascular;  Laterality: N/A;   CORONARY ANGIOPLASTY WITH STENT PLACEMENT     CORONARY ARTERY BYPASS GRAFT     4 time since 2002   LEFT HEART CATHETERIZATION WITH CORONARY/GRAFT ANGIOGRAM  04/05/2013   Procedure: LEFT HEART CATHETERIZATION WITH Beatrix Fetters;  Surgeon: Peter M Martinique, MD;  Location: [facility] CATH LAB;  Service: Cardiovascular;;   left knee surgery     LOWER EXTREMITY ANGIOGRAPHY Left 12/25/2016   Procedure: LOWER EXTREMITY ANGIOGRAPHY;  Surgeon: Algernon Huxley, MD;  Location: [place] CV LAB;  Service: Cardiovascular;  Laterality: Left;   PERCUTANEOUS CORONARY STENT INTERVENTION (PCI-S)  04/05/2013   Procedure: PERCUTANEOUS CORONARY STENT INTERVENTION (PCI-S);  Surgeon: Peter M Martinique, MD;  Location: [facility] CATH LAB;  Service: Cardiovascular;;  DES to native Mid cx   VASCULAR SURGERY      Current Medications: Outpatient Medications Prior to Visit  Medication Sig  Dispense Refill   acetaminophen (TYLENOL) 500 MG tablet Take 1,000 mg every 8 (eight) hours as needed by mouth for mild pain or headache.      atorvastatin (LIPITOR) 20 MG tablet Take 1 tablet (20 mg total) by mouth daily. 90 tablet 3   Blood Glucose Monitoring Suppl (FIFTY50 GLUCOSE METER 2.0) w/Device KIT Use as directed.     gabapentin (NEURONTIN) 300 MG capsule Take 600 mg by mouth at bedtime as needed (pain in foot and ankle).      Insulin Glargine, 1 Unit Dial, (TOUJEO SOLOSTAR) 300 UNIT/ML SOPN Inject 70 Units into the skin every morning. Patient takes Toujeo 70 units QAM; last dose on 06/17/18  insulin lispro (HUMALOG KWIKPEN) 100 UNIT/ML injection Inject 36 Units into the skin 3 (three) times daily before meals.      Insulin Syringe-Needle U-100 31G X 5/16" 0.3 ML MISC use as directed     isosorbide mononitrate (IMDUR) 30 MG 24 hr tablet Take 1 tablet (30 mg total) by mouth daily. 90 tablet 3   losartan (COZAAR) 25 MG tablet Take 1 tablet (25 mg total) by mouth daily. 90 tablet 3   metFORMIN (GLUCOPHAGE-XR) 500 MG 24 hr tablet Take 500 mg by mouth daily with breakfast.     metoprolol succinate (TOPROL-XL) 25 MG 24 hr tablet Take 0.5 tablets (12.5 mg total) by mouth daily. 90 tablet 3   nitroGLYCERIN (NITROSTAT) 0.4 MG SL tablet Place 1 tablet (0.4 mg total) under the tongue every 5 (five) minutes as needed for chest pain. NEEDS APPOINTMENT 25 tablet 6   pantoprazole (PROTONIX) 40 MG tablet TAKE 1 TABLET (40 MG TOTAL) BY MOUTH AS NEEDED (INDIGESTION). (Patient taking differently: Take 40 mg by mouth every evening. ) 90 tablet 1   potassium chloride (K-DUR) 10 MEQ tablet Take 10 mEq by mouth every morning.     torsemide (DEMADEX) 20 MG tablet Take 40 mg by mouth daily.     warfarin (COUMADIN) 5 MG tablet TAKE 1 TABLET (5 MG TOTAL) BY MOUTH AS DIRECTED. TAKE AS DIRECTED BY THE COUMADIN CLINIC. 60 tablet 0   No facility-administered medications prior to visit.       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 strain: Not on file   Food insecurity    Worry: Not on file    Inability: Not on file   Transportation needs    Medical: Not on file    Non-medical: Not on file  Tobacco Use   Smoking status: Never Smoker   Smokeless tobacco: Never Used  Substance and Sexual Activity   Alcohol use: Yes    Comment: rare   Drug use: No   Sexual activity: Yes  Lifestyle   Physical activity    Days per week: Not on file    Minutes per session: Not on file   Stress: Not on file  Relationships   Social connections    Talks on phone: Not on file    Gets together: Not on file    Attends religious service: Not on file    Active member of club or organization: Not on file    Attends meetings of clubs or organizations: Not on file    Relationship status: Not on file  Other Topics Concern   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      Family History:  The patient's family history includes Cancer in his father; Heart disease in his mother; Hypertension in his mother and sister.   ROS:   Please see the history of present illness.    ROS All other systems reviewed and are negative.   PHYSICAL EXAM:   VS:  BP (!) 146/70    Pulse 69    Temp 98.4  F (36.9 C) (Temporal)    Ht 6' (1.829 m)    Wt 267 lb (121.1 kg)    SpO2 95%    BMI 36.21 kg/m    GEN: Well nourished, well developed, in no acute distress  HEENT: normal  Neck: no JVD, carotid bruits, or masses Cardiac: RRR; no murmurs, rubs, or gallops. 1+ RLE edema, LLE amputation  Respiratory:   clear to auscultation bilaterally, normal work of breathing GI: soft, nontender, nondistended, + BS MS: no deformity or atrophy  Skin: warm and dry, no rash Neuro:  Alert and Oriented x 3, Strength and sensation are intact Psych: euthymic mood, full affect  Wt Readings from Last 3 Encounters:  10/29/18 267 lb (121.1 kg)  08/13/18 268 lb (121.6 kg)  07/09/18 261 lb 0.4 oz (118.4 kg)      Studies/Labs Reviewed:   EKG:  EKG is ordered today.  The ekg ordered today demonstrates normal sinus rhythm, occasional Wenckebach rhythm, inferior Q waves, occasional PVC  Recent Labs: 06/17/2018: B Natriuretic Peptide 307.1; TSH 3.144 06/20/2018: Magnesium 2.1 07/09/2018: ALT 30; Hemoglobin 14.3; Platelets 175 10/29/2018: BUN 21; Creatinine, Ser 1.10; Potassium 4.1; Sodium 139   Lipid Panel    Component Value Date/Time   CHOL 123 04/18/2018 0833   TRIG 117 04/18/2018 0833   HDL 37 (L) 04/18/2018 0833   CHOLHDL 3.3 04/18/2018 0833   CHOLHDL 3.6 CALC 03/11/2008 0930   VLDL 21 03/11/2008 0930   LDLCALC 63 04/18/2018 0833    Additional studies/ records that were reviewed today include:   Zio monitor 07/2018 Study Highlights  Atrial fib,   Pauses of 3.8 seconds longest are during the sleeping hours.   No symptoms reported.         ASSESSMENT:    1. Chronic diastolic heart failure ([place])   2. PAF (paroxysmal atrial fibrillation) ([place])   3. Coronary artery disease involving coronary bypass graft of native heart without angina pectoris   4. Essential hypertension   5. Hyperlipidemia, unspecified hyperlipidemia type   6. Controlled type 2 diabetes mellitus without complication, with long-term current use of insulin (HCC)      PLAN:  In order of problems listed above:  1. Chronic diastolic heart failure: Continue on current diuretic.  Take additional torsemide in the afternoon on a as needed basis for increased swelling.  Obtain basic metabolic panel today.  2. PAF: Managed on  metoprolol and Coumadin.  3. CAD s/p CABG: Continue Lipitor, denies any recent chest pain  4. Hypertension: Blood pressure stable  5. Hyperlipidemia: On Lipitor 20 mg daily  6. DM2: On insulin, managed by primary care provider.    Medication Adjustments/Labs and Tests Ordered: Current medicines are reviewed at length with the patient today.  Concerns regarding medicines are outlined above.  Medication changes, Labs and Tests ordered today are listed in the Patient Instructions below. Patient Instructions  Medication Instructions:   Continue to take the Torsemide AS NEEDED in the afternoons   If you need a refill on your cardiac medications before your next appointment, please call your pharmacy.   Lab work:  You will need labs (blood work) drawn today:  BMET If you have labs (blood work) drawn today and your tests are completely normal, you will receive your results only by:  [place] (if you have MyChart) OR  A paper copy in the mail If you have any lab test that is abnormal or we need to change your treatment, we will call you  to review the results.  Testing/Procedures: NONE ordered at this time of appointment   Follow-Up: At [facility], you and your health needs are our priority.  As part of our continuing mission to provide you with exceptional heart care, we have created designated Provider Care Teams.  These Care Teams include your primary Cardiologist (physician) and Advanced Practice Providers (APPs -  Physician Assistants and Nurse Practitioners) who all work together to provide you with the care you need, when you need it.  You will need a follow up appointment in 3 months with Minus Breeding, MD  Any Other Special Instructions Will Be Listed Below (If Applicable).       Hilbert Corrigan, [place]  10/30/2018 10:30 PM    [facility] [place], [place], [place]  [address] Phone: [phone]; Fax: [phone]

## 2018-10-30 ENCOUNTER — Telehealth: Payer: Self-pay

## 2018-10-30 ENCOUNTER — Encounter: Payer: Self-pay | Admitting: Physician Assistant

## 2018-10-30 ENCOUNTER — Ambulatory Visit (INDEPENDENT_AMBULATORY_CARE_PROVIDER_SITE_OTHER): Payer: Managed Care, Other (non HMO)

## 2018-10-30 DIAGNOSIS — I2119 ST elevation (STEMI) myocardial infarction involving other coronary artery of inferior wall: Secondary | ICD-10-CM | POA: Diagnosis not present

## 2018-10-30 DIAGNOSIS — I480 Paroxysmal atrial fibrillation: Secondary | ICD-10-CM

## 2018-10-30 DIAGNOSIS — Z86711 Personal history of pulmonary embolism: Secondary | ICD-10-CM

## 2018-10-30 DIAGNOSIS — Z5181 Encounter for therapeutic drug level monitoring: Secondary | ICD-10-CM | POA: Diagnosis not present

## 2018-10-30 DIAGNOSIS — Z7901 Long term (current) use of anticoagulants: Secondary | ICD-10-CM | POA: Diagnosis not present

## 2018-10-30 DIAGNOSIS — Z86718 Personal history of other venous thrombosis and embolism: Secondary | ICD-10-CM

## 2018-10-30 LAB — BASIC METABOLIC PANEL
BUN/Creatinine Ratio: 19 (ref 10–24)
BUN: 21 mg/dL (ref 8–27)
CO2: 24 mmol/L (ref 20–29)
Calcium: 9.4 mg/dL (ref 8.6–10.2)
Chloride: 98 mmol/L (ref 96–106)
Creatinine, Ser: 1.1 mg/dL (ref 0.76–1.27)
GFR calc Af Amer: 82 mL/min/{1.73_m2} (ref >59)
GFR calc non Af Amer: 71 mL/min/{1.73_m2} (ref >59)
Glucose: 261 mg/dL — ABNORMAL HIGH (ref 65–99)
Potassium: 4.1 mmol/L (ref 3.5–5.2)
Sodium: 139 mmol/L (ref 134–144)

## 2018-10-30 LAB — POCT INR: INR: 1.9 — AB (ref 2.0–3.0)

## 2018-10-30 NOTE — Patient Instructions (Addendum)
Please take 2 of the 5 mg tablets tonight, and continue Dr. Olin Pia dosage of 9 mg every day. Recheck in 3 weeks.

## 2018-10-30 NOTE — Telephone Encounter (Addendum)
Left a detailed message for the patient with his results and to give the office a call back if he has any questions.  ----- Message from Almyra Deforest, [place] sent at 10/30/2018  3:03 PM EDT ----- Kidney function and electrolyte stable.

## 2018-11-20 ENCOUNTER — Ambulatory Visit (INDEPENDENT_AMBULATORY_CARE_PROVIDER_SITE_OTHER): Payer: Managed Care, Other (non HMO)

## 2018-11-20 ENCOUNTER — Other Ambulatory Visit: Payer: Self-pay

## 2018-11-20 DIAGNOSIS — I2119 ST elevation (STEMI) myocardial infarction involving other coronary artery of inferior wall: Secondary | ICD-10-CM | POA: Diagnosis not present

## 2018-11-20 DIAGNOSIS — I480 Paroxysmal atrial fibrillation: Secondary | ICD-10-CM

## 2018-11-20 DIAGNOSIS — Z5181 Encounter for therapeutic drug level monitoring: Secondary | ICD-10-CM | POA: Diagnosis not present

## 2018-11-20 DIAGNOSIS — Z86718 Personal history of other venous thrombosis and embolism: Secondary | ICD-10-CM | POA: Diagnosis not present

## 2018-11-20 DIAGNOSIS — Z86711 Personal history of pulmonary embolism: Secondary | ICD-10-CM | POA: Diagnosis not present

## 2018-11-20 DIAGNOSIS — Z7901 Long term (current) use of anticoagulants: Secondary | ICD-10-CM

## 2018-11-20 LAB — POCT INR: INR: 3.5 — AB (ref 2.0–3.0)

## 2018-11-20 NOTE — Patient Instructions (Signed)
Please skip warfarin tonight, then continue Dr. Olin Pia dosage of 9 mg every day. Recheck in 3 weeks.

## 2018-12-02 ENCOUNTER — Other Ambulatory Visit: Payer: Self-pay | Admitting: Cardiology

## 2018-12-03 NOTE — Telephone Encounter (Signed)
Rx request sent to pharmacy.  

## 2018-12-11 ENCOUNTER — Other Ambulatory Visit: Payer: Self-pay

## 2018-12-11 ENCOUNTER — Ambulatory Visit (INDEPENDENT_AMBULATORY_CARE_PROVIDER_SITE_OTHER): Payer: Managed Care, Other (non HMO)

## 2018-12-11 DIAGNOSIS — Z86718 Personal history of other venous thrombosis and embolism: Secondary | ICD-10-CM | POA: Diagnosis not present

## 2018-12-11 DIAGNOSIS — Z5181 Encounter for therapeutic drug level monitoring: Secondary | ICD-10-CM | POA: Diagnosis not present

## 2018-12-11 DIAGNOSIS — Z7901 Long term (current) use of anticoagulants: Secondary | ICD-10-CM

## 2018-12-11 DIAGNOSIS — Z86711 Personal history of pulmonary embolism: Secondary | ICD-10-CM

## 2018-12-11 DIAGNOSIS — I480 Paroxysmal atrial fibrillation: Secondary | ICD-10-CM

## 2018-12-11 DIAGNOSIS — I2119 ST elevation (STEMI) myocardial infarction involving other coronary artery of inferior wall: Secondary | ICD-10-CM | POA: Diagnosis not present

## 2018-12-11 LAB — POCT INR: INR: 3.4 — AB (ref 2.0–3.0)

## 2018-12-11 NOTE — Patient Instructions (Signed)
Please skip warfarin tonight, then START NEW DOSAGE of 9 mg every day EXCEPT 5 mg on [place]. Recheck in 2 weeks.

## 2018-12-18 ENCOUNTER — Other Ambulatory Visit: Payer: Self-pay | Admitting: Cardiology

## 2019-01-27 DIAGNOSIS — I5032 Chronic diastolic (congestive) heart failure: Secondary | ICD-10-CM | POA: Insufficient documentation

## 2019-01-27 DIAGNOSIS — I25810 Atherosclerosis of coronary artery bypass graft(s) without angina pectoris: Secondary | ICD-10-CM | POA: Insufficient documentation

## 2019-01-27 NOTE — Progress Notes (Addendum)
Cardiology Office Note   Date:  01/28/2019   ID:  [name], DOB 04/18/1955, MRN [id number]  PCP:  Adin Hector, MD  Cardiologist:   Minus Breeding, MD   Chief Complaint  Patient presents with  . Shortness of Breath     History of Present Illness: Bobby Lindsey is a 63 y.o. male who presents for follow up of acute on chronic diastolic HF.  In May he was in the hospital for this.  I reviewed these records for this visit.    He presented with volume overload and was in atrial flutter.  Echo was done and his EF was 60 - 65%.  There were no significant valvular abnormalities.    He was also treated for cellulitis.  He did have cardioversion.  Of note he had a slow ventricular rate.  He had diuresis.  Unfortunately he does not know what his weight was after he had been diuresed and when he got home.   I saw him virtually following this.  He had a ZIO patch and he had atrial fib with slow ventricular rates.  He had pauses during sleep with a max pause of 3.8 sec .  He was to have a sleep study but this did not get approved.    He returns for routine follow-up.  Since I last saw him he has been getting slowly more short of breath.  He said this has been accumulating over the last couple of weeks.  Has had increased lower extremity swelling.  It is clear that he does not watch his fluid and he drinks "quite a bit".  He has a limited understanding of what we are asking him to do with fluid management.  He is only been taking 40 mg in the morning and 20 mg of Demadex although we had listed 40 and 40.  His wife has been giving him an extra 47 recently.  He has had some oxygen sats have been down and he sleeps chronically with his head slightly elevated in the bed and yesterday slept in a chair.  He is not having any chest pressure, neck or arm discomfort.  He is not having any cough fevers or chills.  He is not weighing himself daily.  He said he is good with salt management.   Past  Medical History:  Diagnosis Date  . Anginal pain ([place])    last pm  . Atrial fibrillation ([place])    a. Transient during 03/2013 admission.  . Bradycardia    a. Bradycardia/pauses/possible Mobitz II during 03/2013 adm. Not on BB due to this.  Marland Kitchen CAD (coronary artery disease)    a. s/p CABG 2002. b. Hx Cypher stent to the RCA. c. Inf-lat STEMI 03/2013:  LHC (04/05/13):  mLAD occluded, pD1 90, apical br of Dx occluded, CFX occluded, pOM1 90-95, RCA stents patent, diff RCA 30, S-Dx occluded, S-PDA occluded, S-OM1 40-50, L-LAD patent, EF 40% with inf HK.  PCI:  Promus (2.5 x 28) DES to mid to dist CFX.  Marland Kitchen Charcot's joint of knee   . COPD (chronic obstructive pulmonary disease) ([place])   . Deep venous thrombosis ([facility])    right lower extremity  . Diabetes mellitus    a. A1C 10.7 in 03/2013.  . Diastolic CHF ([place])    a. EF 40% by cath, 55-60% during 03/2013 adm, required IV diuresis.  . DVT, lower extremity, recurrent ([place])    a. Hx recurrent DVT per record.  Marland Kitchen  Dyslipidemia   . Elevated CK    a. Pt has refused rheum workup in the past.  . GERD (gastroesophageal reflux disease)   . History of hiatal hernia   . HTN (hypertension)    x 15 years  . Hx of cardiovascular stress test    a. Lexiscan Myoview (03/2010):  diaph atten vs inf scar, no ischemia, EF 47%; Low Risk.  Marland Kitchen Hx of echocardiogram    a. Echo (04/08/13):  Mild LVH, EF 55-60%, restrictive physiology, severe LAE, mild reduced RVSF, mild RAE.  . Leg pain    ABI 6/16:  R 1.2, L 1.1 - normal  . Myocardial infarction ([place])   . Obesity   . Peripheral neuropathy   . Pulmonary embolism ([place])   . Sleep apnea     Past Surgical History:  Procedure Laterality Date  . CARDIOVERSION N/A 06/19/2018   Procedure: CARDIOVERSION;  Surgeon: Acie Fredrickson, Wonda Cheng, MD;  Location: [facility];  Service: Cardiovascular;  Laterality: N/A;  . CORONARY ANGIOPLASTY WITH STENT PLACEMENT    . CORONARY ARTERY BYPASS GRAFT     4 time since 2002  . LEFT HEART  CATHETERIZATION WITH CORONARY/GRAFT ANGIOGRAM  04/05/2013   Procedure: LEFT HEART CATHETERIZATION WITH Beatrix Fetters;  Surgeon: Peter M Martinique, MD;  Location: [facility] CATH LAB;  Service: Cardiovascular;;  . left knee surgery    . LOWER EXTREMITY ANGIOGRAPHY Left 12/25/2016   Procedure: LOWER EXTREMITY ANGIOGRAPHY;  Surgeon: Algernon Huxley, MD;  Location: [facility] CV LAB;  Service: Cardiovascular;  Laterality: Left;  . PERCUTANEOUS CORONARY STENT INTERVENTION (PCI-S)  04/05/2013   Procedure: PERCUTANEOUS CORONARY STENT INTERVENTION (PCI-S);  Surgeon: Peter M Martinique, MD;  Location: [facility] CATH LAB;  Service: Cardiovascular;;  DES to native Mid cx  . VASCULAR SURGERY       Current Outpatient Medications  Medication Sig Dispense Refill  . acetaminophen (TYLENOL) 500 MG tablet Take 1,000 mg every 8 (eight) hours as needed by mouth for mild pain or headache.     Marland Kitchen atorvastatin (LIPITOR) 20 MG tablet Take 1 tablet (20 mg total) by mouth daily. 90 tablet 3  . Blood Glucose Monitoring Suppl (FIFTY50 GLUCOSE METER 2.0) w/Device KIT Use as directed.    . gabapentin (NEURONTIN) 300 MG capsule Take 600 mg by mouth at bedtime as needed (pain in foot and ankle).     . Insulin Glargine, 1 Unit Dial, (TOUJEO SOLOSTAR) 300 UNIT/ML SOPN Inject 70 Units into the skin every morning. Patient takes Toujeo 70 units QAM; last dose on 06/17/18    . insulin lispro (HUMALOG KWIKPEN) 100 UNIT/ML injection Inject 36 Units into the skin 3 (three) times daily before meals.     . Insulin Syringe-Needle U-100 31G X 5/16" 0.3 ML MISC use as directed    . isosorbide mononitrate (IMDUR) 30 MG 24 hr tablet Take 1 tablet (30 mg total) by mouth daily. 90 tablet 3  . losartan (COZAAR) 25 MG tablet Take 1 tablet (25 mg total) by mouth daily. 90 tablet 3  . metFORMIN (GLUCOPHAGE-XR) 500 MG 24 hr tablet Take 500 mg by mouth daily with breakfast.    . metoprolol succinate (TOPROL-XL) 25 MG 24 hr tablet Take 0.5 tablets (12.5 mg total)  by mouth daily. 90 tablet 3  . nitroGLYCERIN (NITROSTAT) 0.4 MG SL tablet Place 1 tablet (0.4 mg total) under the tongue every 5 (five) minutes as needed for chest pain. NEEDS APPOINTMENT 25 tablet 6  . pantoprazole (PROTONIX) 40 MG tablet Take  1 tablet (40 mg total) by mouth every evening. 90 tablet 1  . potassium chloride (K-DUR) 10 MEQ tablet Take 10 mEq by mouth every morning.    Derrill Memo ON 02/01/2019] torsemide (DEMADEX) 20 MG tablet Take 2 tablets (40 mg total) by mouth 2 (two) times daily. 180 tablet 4  . warfarin (COUMADIN) 5 MG tablet TAKE 1 TABLET (5 MG TOTAL) BY MOUTH AS DIRECTED. TAKE AS DIRECTED BY THE COUMADIN CLINIC. 60 tablet 0   No current facility-administered medications for this visit.    Allergies:   Simvastatin    ROS:  Please see the history of present illness.   Otherwise, review of systems are positive for none.   All other systems are reviewed and negative.    PHYSICAL EXAM: VS:  BP 118/62   Pulse (!) 55   Ht 6' (1.829 m)   Wt 269 lb (122 kg)   SpO2 (!) 87%   BMI 36.48 kg/m  , BMI Body mass index is 36.48 kg/m. GENERAL:  Well appearing NECK:  No jugular venous distention, waveform within normal limits, carotid upstroke brisk and symmetric, no bruits, no thyromegaly LUNGS:  Clear to auscultation bilaterally CHEST:  Well healed sternotomy scar. HEART:  PMI not displaced or sustained,S1 and S2 within normal limits, no S3, no S4, no clicks, no rubs, no murmurs ABD:  Flat, positive bowel sounds normal in frequency in pitch, no bruits, no rebound, no guarding, no midline pulsatile mass, no hepatomegaly, no splenomegaly EXT:  2 plus pulses upper absent dorsalis pedis and posterior tibialis, moderate right leg edema with a blister, no cyanosis no clubbing, s/p left BKA.,  Chronic venous stasis changes   EKG:  EKG is not ordered today.    Recent Labs: 06/17/2018: B Natriuretic Peptide 307.1; TSH 3.144 06/20/2018: Magnesium 2.1 07/09/2018: ALT 30; Hemoglobin 14.3;  Platelets 175 10/29/2018: BUN 21; Creatinine, Ser 1.10; Potassium 4.1; Sodium 139    Lipid Panel    Component Value Date/Time   CHOL 123 04/18/2018 0833   TRIG 117 04/18/2018 0833   HDL 37 (L) 04/18/2018 0833   CHOLHDL 3.3 04/18/2018 0833   CHOLHDL 3.6 CALC 03/11/2008 0930   VLDL 21 03/11/2008 0930   LDLCALC 63 04/18/2018 0833      Wt Readings from Last 3 Encounters:  01/28/19 269 lb (122 kg)  10/29/18 267 lb (121.1 kg)  08/13/18 268 lb (121.6 kg)    Other studies Reviewed: Additional studies/ records that were reviewed today include: Labs.  Greater than 60 minutes with this patient this visit more than 30 minutes face-to-face Review of the above records demonstrates:     ASSESSMENT AND PLAN:  CHRONIC DIASTOLIC HF:    Today he again has fluid overload.  We again had a discussion about salt fluid and weight management.  I am going to have him increase his Demadex to 60 mg twice daily for 3 days and then go down to 40 and 20.  He will need a virtual visit early next week and should get a basic metabolic profile early next week.  His last creatinine was normal.   CAD:  The patient has no new sypmtoms.  No further cardiovascular testing is indicated.   RDE:YCXKG pressure is well controlled.  No change in therapy.   HYPERLIPIDEMIA: LDL was 63 with an HDL of 37.  No change in therapy.   DM:A1c was 11.7.  He has not had follow-up with his endocrinologist he says in over a year but they  stated we will schedule this.   OSA:The patient has hypersomnolence, snoring.  He has an elevated Epworth sleep score.  Screening with a sleep study is indicated.   ATRIAL FLUTTER: He has had slow rates as recorded above.  He has had no symptoms related to this however.  He is not having any presyncope or syncope.  He is overdue for INR and is going to come back to get this checked.   DYSPNEA: He has hypoxemia as described above which I think is volume overload.  He does have a pulse  ox at home.  I am going to keep a close eye on this when he gets increased diuresis and check back with him as above early next week.  He qualifies for oxygen above this will be ordered.  Start O2 sat: 88%- no O2 at rest Pt started on 2L O2  Sitting with 2L: 90%  Sitting with 3L: 96%  Sitting with 4L: 98%   Walking/exertion without O2:  82-85% during walk  By the time pt sat down O2 sat at 74%  Pt c/o lightheadedness; noticeably SOB   Restarted O2 at 2L  2L: 94%  3L: 97%   Discontinued O2:  pt sat at 94%    COVID EDUCATION: We talked about the vaccine.   Current medicines are reviewed at length with the patient today.  The patient does not have concerns regarding medicines.  The following changes have been made:  As above  Labs/ tests ordered today include: See elsewhere  Orders Placed This Encounter  Procedures  . Basic metabolic panel     Disposition:   FU with virtual clinic with Almyra Deforest PA early next week.  Keane Scrape, MD  01/28/2019 3:52 PM    [facility]

## 2019-01-28 ENCOUNTER — Ambulatory Visit: Payer: Managed Care, Other (non HMO) | Admitting: Cardiology

## 2019-01-28 ENCOUNTER — Telehealth: Payer: Self-pay

## 2019-01-28 ENCOUNTER — Other Ambulatory Visit: Payer: Self-pay

## 2019-01-28 ENCOUNTER — Encounter: Payer: Self-pay | Admitting: Cardiology

## 2019-01-28 VITALS — BP 118/62 | HR 55 | Ht 72.0 in | Wt 269.0 lb

## 2019-01-28 DIAGNOSIS — I5032 Chronic diastolic (congestive) heart failure: Secondary | ICD-10-CM | POA: Diagnosis not present

## 2019-01-28 DIAGNOSIS — I110 Hypertensive heart disease with heart failure: Secondary | ICD-10-CM

## 2019-01-28 DIAGNOSIS — E118 Type 2 diabetes mellitus with unspecified complications: Secondary | ICD-10-CM

## 2019-01-28 DIAGNOSIS — I25810 Atherosclerosis of coronary artery bypass graft(s) without angina pectoris: Secondary | ICD-10-CM | POA: Diagnosis not present

## 2019-01-28 DIAGNOSIS — I10 Essential (primary) hypertension: Secondary | ICD-10-CM

## 2019-01-28 DIAGNOSIS — I4892 Unspecified atrial flutter: Secondary | ICD-10-CM

## 2019-01-28 DIAGNOSIS — E785 Hyperlipidemia, unspecified: Secondary | ICD-10-CM

## 2019-01-28 DIAGNOSIS — Z7189 Other specified counseling: Secondary | ICD-10-CM

## 2019-01-28 MED ORDER — TORSEMIDE 20 MG PO TABS: 40.0000 mg | ORAL_TABLET | Freq: Two times a day (BID) | ORAL | 4 refills | Status: DC

## 2019-01-28 NOTE — Telephone Encounter (Signed)
Pt seen today in office. He is aware that Dr. Michelle Piper ordering oxygen for home use and that [facility] will be in contact with him. Signed order for home use only DME oxygen faxed to [facility] at [phone] on 01/28/2019. Community message sent to [employer] on 01/28/2019 regarding order for oxygen.

## 2019-01-28 NOTE — Patient Instructions (Addendum)
Medication Instructions:  Your physician has recommended you make the following change in your medication:   TAKE 3 TABLETS (60 MG) OF YOUR DEMADEX TWICE A DAY FOR 3 DAYS. AFTER 3 DAYS, DECREASE TO 2 TABLETS (40 MG) TWICE A DAY AND CONTINUE ON THIS DOSE.  *If you need a refill on your cardiac medications before your next appointment, please call your pharmacy*  Lab Work: Your physician recommends that you have labs drawn next week at the [facility] location. You do not need an appointment for lab work.  BASIC METABOLIC PANEL  If you have labs (blood work) drawn today and your tests are completely normal, you will receive your results only by: Marland Kitchen MyChart Message (if you have MyChart) OR . A paper copy in the mail If you have any lab test that is abnormal or we need to change your treatment, we will call you to review the results.  Testing/Procedures: NONE  Follow-Up: At [facility], you and your health needs are our priority.  As part of our continuing mission to provide you with exceptional heart care, we have created designated Provider Care Teams.  These Care Teams include your primary Cardiologist (physician) and Advanced Practice Providers (APPs -  Physician Assistants and Nurse Practitioners) who all work together to provide you with the care you need, when you need it.  Your next appointment:   Next week Monday or Tuesday)  The format for your next appointment:   Virtual Visit   Provider:   You may see one of the following Advanced Practice Providers on your designated Care Team:    Almyra Deforest, [place]

## 2019-01-29 LAB — POCT INR: INR: 3.1 — AB (ref 2.0–3.0)

## 2019-02-03 NOTE — Progress Notes (Signed)
Virtual Visit via Telephone Note   This visit type was conducted due to national recommendations for restrictions regarding the COVID-19 Pandemic (e.g. social distancing) in an effort to limit this patient's exposure and mitigate transmission in our community.  Due to his co-morbid illnesses, this patient is at least at moderate risk for complications without adequate follow up.  This format is felt to be most appropriate for this patient at this time.  The patient did not have access to video technology/had technical difficulties with video requiring transitioning to audio format only (telephone).  All issues noted in this document were discussed and addressed.  No physical exam could be performed with this format.  Please refer to the patient's chart for his  consent to telehealth for [facility]. Virtual platform was offered given ongoing worsening Covid-19 pandemic.  Date:  02/04/2019   ID:  Bobby Lindsey, DOB July 21, 1955, MRN [id number]  Patient Location: Home Provider Location: [facility]  PCP:  Adin Hector, MD  Cardiologist:  Minus Breeding, MD  Electrophysiologist:  None   Evaluation Performed:  Follow-Up Visit  Chief Complaint: follow-up of CHF  History of Present Illness:    Bobby Lindsey is a 63 y.o. male with a history of CAD s/p CABG in 2002 with subsequent DES to LCX in 3267, chronic diastolic CHF, atrial fibrillation on Coumadin, prior DVT and PE, COPD, hypertension, hyperlipidemia, diabetes mellitus with peripheral neuropathy, GERD, Charcot's of knee joint who is followed by Dr. Percival Spanish.   Most recent ischemic evaluation was a cardiac catheterization in 03/2013 which showed severe 3 vessel CAD with patent LIMA to LAD and SVG to OM and patent stents to native RCA. SVG to Diagonal and SVG to PDA were both occluded. Patient underwent successful PCI with DES to mid to distal native LCX with 90-95% stenosis.   Patient was admitted in 06/2018 with acute on  chronic diastolic CHF and was found to be in atrial flutter. Echo during this admission showed LVEF of 60-65% with mild LVH, indeterminate diastolic dysfunction, mild left atrial enlargement, mild TR, and moderate pulmonary hypertension. He was diuresed and underwent DCCV. Of note, he did have slow ventricular rate. Following discharge, he had Zio Monitor which showed atrial fibrillation with occasional slow ventricular rate as well as pauses as long as 3.8 seconds a night while sleeping. No symptoms reported. Sleep study was recommended but this did not get approved.   Patient was recently seen by Dr. Percival Spanish on 01/28/2019 at which time he reported more shortness of breath and lower extremity edema. It was clear that he was not limiting his fluid intake. He was also only taking Demadex 57m in the morning and 267min the evening although he was prescribed 4039mwice daily. O2 sats were also noted to be in the high 80's. Demadex was increased to 39m3mice daily for 3 days and then patient was instructed to go back to 40mg66mce daily. Home O2 was also ordered. Patient was instructed to follow-up in 1 week.   Patient presents today for virtual visit. Tried to do video visit but was unable to get it to work. Spoke with patient and wife on the phone. Patient states breathing and lower extremity edema have improved since last week, especially over the last 2 days. Daily weights have been trending down (270 >> 269 >> 266 >> 268 >> 269 >> 263 >> 264). Breathing better at night as well. Stable orthopnea. No PND. He has been doing  a better job at restricting his fluid and really trying to stick to <48 oz per day. Also following low sodium diet. No chest pain. No palpitations. He reports occasional mild dizziness but no falls, near syncope, or syncope. No abnormal bleeding.   Past Medical History:  Diagnosis Date  . Anginal pain ([place])    last pm  . Atrial fibrillation ([place])    a. Transient during 03/2013  admission.  . Bradycardia    a. Bradycardia/pauses/possible Mobitz II during 03/2013 adm. Not on BB due to this.  Marland Kitchen CAD (coronary artery disease)    a. s/p CABG 2002. b. Hx Cypher stent to the RCA. c. Inf-lat STEMI 03/2013:  LHC (04/05/13):  mLAD occluded, pD1 90, apical br of Dx occluded, CFX occluded, pOM1 90-95, RCA stents patent, diff RCA 30, S-Dx occluded, S-PDA occluded, S-OM1 40-50, L-LAD patent, EF 40% with inf HK.  PCI:  Promus (2.5 x 28) DES to mid to dist CFX.  Marland Kitchen Charcot's joint of knee   . COPD (chronic obstructive pulmonary disease) ([place])   . Deep venous thrombosis (H[facility])    right lower extremity  . Diabetes mellitus    a. A1C 10.7 in 03/2013.  . Diastolic CHF ([place])    a. EF 40% by cath, 55-60% during 03/2013 adm, required IV diuresis.  . DVT, lower extremity, recurrent ([place])    a. Hx recurrent DVT per record.  . Dyslipidemia   . Elevated CK    a. Pt has refused rheum workup in the past.  . GERD (gastroesophageal reflux disease)   . History of hiatal hernia   . HTN (hypertension)    x 15 years  . Hx of cardiovascular stress test    a. Lexiscan Myoview (03/2010):  diaph atten vs inf scar, no ischemia, EF 47%; Low Risk.  Marland Kitchen Hx of echocardiogram    a. Echo (04/08/13):  Mild LVH, EF 55-60%, restrictive physiology, severe LAE, mild reduced RVSF, mild RAE.  . Leg pain    ABI 6/16:  R 1.2, L 1.1 - normal  . Myocardial infarction ([place])   . Obesity   . Peripheral neuropathy   . Pulmonary embolism ([place])   . Sleep apnea    Past Surgical History:  Procedure Laterality Date  . CARDIOVERSION N/A 06/19/2018   Procedure: CARDIOVERSION;  Surgeon: Acie Fredrickson, Wonda Cheng, MD;  Location: [facility];  Service: Cardiovascular;  Laterality: N/A;  . CORONARY ANGIOPLASTY WITH STENT PLACEMENT    . CORONARY ARTERY BYPASS GRAFT     4 time since 2002  . LEFT HEART CATHETERIZATION WITH CORONARY/GRAFT ANGIOGRAM  04/05/2013   Procedure: LEFT HEART CATHETERIZATION WITH Beatrix Fetters;  Surgeon: Peter  M Martinique, MD;  Location: [facility] CATH LAB;  Service: Cardiovascular;;  . left knee surgery    . LOWER EXTREMITY ANGIOGRAPHY Left 12/25/2016   Procedure: LOWER EXTREMITY ANGIOGRAPHY;  Surgeon: Algernon Huxley, MD;  Location: [place] CV LAB;  Service: Cardiovascular;  Laterality: Left;  . PERCUTANEOUS CORONARY STENT INTERVENTION (PCI-S)  04/05/2013   Procedure: PERCUTANEOUS CORONARY STENT INTERVENTION (PCI-S);  Surgeon: Peter M Martinique, MD;  Location: [facility] CATH LAB;  Service: Cardiovascular;;  DES to native Mid cx  . VASCULAR SURGERY       Current Meds  Medication Sig  . acetaminophen (TYLENOL) 500 MG tablet Take 1,000 mg every 8 (eight) hours as needed by mouth for mild pain or headache.   Marland Kitchen atorvastatin (LIPITOR) 20 MG tablet Take 1 tablet (20 mg total) by mouth daily.  Marland Kitchen  Blood Glucose Monitoring Suppl (FIFTY50 GLUCOSE METER 2.0) w/Device KIT Use as directed.  . gabapentin (NEURONTIN) 300 MG capsule Take 600 mg by mouth at bedtime as needed (pain in foot and ankle).   . Insulin Glargine, 1 Unit Dial, (TOUJEO SOLOSTAR) 300 UNIT/ML SOPN Inject 70 Units into the skin every morning. Patient takes Toujeo 70 units QAM; last dose on 06/17/18  . insulin lispro (HUMALOG KWIKPEN) 100 UNIT/ML injection Inject 36 Units into the skin 3 (three) times daily before meals.   . Insulin Syringe-Needle U-100 31G X 5/16" 0.3 ML MISC use as directed  . isosorbide mononitrate (IMDUR) 30 MG 24 hr tablet Take 1 tablet (30 mg total) by mouth daily.  Marland Kitchen losartan (COZAAR) 25 MG tablet Take 1 tablet (25 mg total) by mouth daily.  . metFORMIN (GLUCOPHAGE-XR) 500 MG 24 hr tablet Take 500 mg by mouth daily with breakfast.  . metoprolol succinate (TOPROL-XL) 25 MG 24 hr tablet Take 0.5 tablets (12.5 mg total) by mouth daily.  . nitroGLYCERIN (NITROSTAT) 0.4 MG SL tablet Place 1 tablet (0.4 mg total) under the tongue every 5 (five) minutes as needed for chest pain. NEEDS APPOINTMENT  . pantoprazole (PROTONIX) 40 MG tablet Take 1 tablet  (40 mg total) by mouth every evening.  . potassium chloride (K-DUR) 10 MEQ tablet Take 10 mEq by mouth every morning.  . torsemide (DEMADEX) 20 MG tablet Take 2 tablets (40 mg total) by mouth 2 (two) times daily.  Marland Kitchen warfarin (COUMADIN) 5 MG tablet TAKE 1 TABLET (5 MG TOTAL) BY MOUTH AS DIRECTED. TAKE AS DIRECTED BY THE COUMADIN CLINIC.     Allergies:   Simvastatin   Social History   Tobacco Use  . Smoking status: Never Smoker  . Smokeless tobacco: Never Used  Substance Use Topics  . Alcohol use: Yes    Comment: rare  . Drug use: No     Family Hx: The patient's family history includes Cancer in his father; Heart disease in his mother; Hypertension in his mother and sister. There is no history of Heart attack or Stroke.  ROS:   Please see the history of present illness.    All other systems reviewed and are negative.   Prior CV studies:    The following studies were reviewed today:   Cardiac Catheterization 04/05/2018: Final Conclusions:  1. Severe 3 vessel obstructive CAD 2. Patent LIMA to the LAD 3. Patent SVG to the OM 4. Occluded SVG to the diagonal 5. Occluded SVG to the PDA 6. Patent stents in the native RCA 7. Moderate LV dysfunction 8. Successful stenting of the mid to distal native LCx with a DES. Given the small caliber of vessel and length of the lesion I did not feel a BMS was appropriate.  _______________  Echocardiogram 06/18/2018: Impressions: 1. The left ventricle has normal systolic function, with an ejection fraction of 60-65%. The cavity size was mildly dilated. There is mildly increased left ventricular wall thickness. Left ventricular diastolic Doppler parameters are consistent with  indeterminate diastolic dysfunction.  2. The right ventricle has normal systolc function. The cavity was normal. Right ventricular systolic pressure is moderately elevated with an estimated pressure of 56.5 mmHg.  3. Left atrial size was mildly dilated.  4. There is mild  mitral annular calcification present.  5. The tricuspid valve was grossly normal.  6. The aortic valve is tricuspid Mild calcification of the aortic valve. No stenosis of the aortic valve.  7. The inferior vena cava was dilated  in size with >50% respiratory variability.  8. Technically difficult; definity used; normal LV systolic function; mild LVH and LVE; mild LAE; mild TR; moderate pulmonary hypertension. _______________  Chauncy Passy 07/02/2018 to 07/06/2018: Predominant underlying rhythm Atrial fibrillation/flutter   Pauses of 3.8 seconds longest are during the sleeping hours.   No symptoms reported.   Labs/Other Tests and Data Reviewed:    EKG: Most recent EKG from 10/27/2018 personally reviewed and demonstrates normal sinus rhythm, rate 66 bpm, with PVC and possible 2nd degree type 1 AV block (Wenkebach). Q waves noted in lead III and anon-specific ST/T changes noted elsewhere but no significant changes from prior tracings.  Recent Labs: 06/17/2018: B Natriuretic Peptide 307.1; TSH 3.144 06/20/2018: Magnesium 2.1 07/09/2018: ALT 30; Hemoglobin 14.3; Platelets 175 10/29/2018: BUN 21; Creatinine, Ser 1.10; Potassium 4.1; Sodium 139   Recent Lipid Panel Lab Results  Component Value Date/Time   CHOL 123 04/18/2018 08:33 AM   TRIG 117 04/18/2018 08:33 AM   HDL 37 (L) 04/18/2018 08:33 AM   CHOLHDL 3.3 04/18/2018 08:33 AM   CHOLHDL 3.6 CALC 03/11/2008 09:30 AM   LDLCALC 63 04/18/2018 08:33 AM    Wt Readings from Last 3 Encounters:  02/04/19 264 lb 3.2 oz (119.8 kg)  01/28/19 269 lb (122 kg)  10/29/18 267 lb (121.1 kg)     Objective:    Vital Signs:  BP 122/70   Pulse 60   Ht 6' (1.829 m)   Wt 264 lb 3.2 oz (119.8 kg)   BMI 35.83 kg/m    VS Reviewed. General: No acute distress. Pulm: No labored breathing. No coughing during visit. No audible wheezing. Speaking in full sentences. Neuro: Alert and oriented. No slurred speech. Answers questions appropriately. Psych: Pleasant  affect.  ASSESSMENT & PLAN:    Chronic Diastolic CHF - Most recent Echo from 10/2018 showed LVEF of 60-65% with mild LVH, indeterminate diastolic dysfunction, mild left atrial enlargement, mild TR, and moderate pulmonary hypertension. - Unable to assess volume status over virtual visit but dyspnea and edema have improved and weights are trending down after increased dose of Demadex. - Currently taking Demadex 37m twice daily. Will continue for now. Patient going to go in to have repeat BMET tomorrow. If renal function and potassium stable, can continue this dose. - Continue daily weights and sodium/fluid restriction.  Of note, wife reports O2 sats still ranging from 87-90%. Was 90% today. Dr. HPercival Spanishordered home O2 but [place]. Will follow-up on this. Advised patient that if O2 sats drop to mid 80's or below and patient has worsening shortness of breath, he should be evaluated immediately. Patient voiced understanding and agreed.  CAD without Angina - History of CABG x4 in 2002 with subsequent stenting to LCX in 2015. - Stable. No angina. - Continue medical therapy: Toprol-XL 12.561mdaily, Imdur 3019maily, Lipitor 25m8mily. No Aspirin due to need for Coumadin.   Atrial Fibrillation/Flutter - Has history of slow ventricular rates but asymptomatic with this.  - Tolerating low dose Toprol well. Will continue.  - Continue chronic anticoagulation with Coumadin. Patient had INR checked at PCP's office on 01/29/2019 and it was 3.1. Will notify our Coumadin clinic so they are aware.  Hypertension - BP well controlled at 122/70 today. - Continue current medications: Losartan 25mg19mly, Toprol-XL 12.5mg d71my, and Imdur 30mg d56m.  Hyperlipidemia - LDL 63 in 04/2018. At goal of <70 given CAD. - Continue Lipitor 25mg da6m  Diabetes  [place] - Hemoglobin A1c 10.1 in 06/2018. - On Insulin at home.  - Discussed importance of better control of blood  sugar. - Managed by PCP.  Obstructive Sleep Apnea - Patient has hypersomnolence and snoring. Sleep study has been ordered in the past but was not approved by insurance. We were previously waiting on peer-to-peer phone with Dr. Percival Spanish but unsure if this has happened yet.  Time:   Today, I have spent 24 minutes with the patient with telehealth technology discussing the above problems.     Medication Adjustments/Labs and Tests Ordered: Current medicines are reviewed at length with the patient today.  Concerns regarding medicines are outlined above.   Follow Up:  In Person in 1 month with Dr. Percival Spanish or APP.  Signed, Darreld Mclean, PA-C  02/04/2019 4:49 PM    [facility]

## 2019-02-04 ENCOUNTER — Telehealth: Payer: Self-pay

## 2019-02-04 ENCOUNTER — Telehealth: Payer: Managed Care, Other (non HMO) | Admitting: Physician Assistant

## 2019-02-04 ENCOUNTER — Telehealth (INDEPENDENT_AMBULATORY_CARE_PROVIDER_SITE_OTHER): Payer: Managed Care, Other (non HMO) | Admitting: Student

## 2019-02-04 ENCOUNTER — Ambulatory Visit: Payer: Self-pay | Admitting: Cardiology

## 2019-02-04 ENCOUNTER — Encounter: Payer: Self-pay | Admitting: Student

## 2019-02-04 VITALS — BP 122/70 | HR 60 | Ht 72.0 in | Wt 264.2 lb

## 2019-02-04 DIAGNOSIS — I2510 Atherosclerotic heart disease of native coronary artery without angina pectoris: Secondary | ICD-10-CM

## 2019-02-04 DIAGNOSIS — E1169 Type 2 diabetes mellitus with other specified complication: Secondary | ICD-10-CM

## 2019-02-04 DIAGNOSIS — Z86718 Personal history of other venous thrombosis and embolism: Secondary | ICD-10-CM

## 2019-02-04 DIAGNOSIS — G4733 Obstructive sleep apnea (adult) (pediatric): Secondary | ICD-10-CM

## 2019-02-04 DIAGNOSIS — I10 Essential (primary) hypertension: Secondary | ICD-10-CM

## 2019-02-04 DIAGNOSIS — Z86711 Personal history of pulmonary embolism: Secondary | ICD-10-CM

## 2019-02-04 DIAGNOSIS — I480 Paroxysmal atrial fibrillation: Secondary | ICD-10-CM

## 2019-02-04 DIAGNOSIS — E785 Hyperlipidemia, unspecified: Secondary | ICD-10-CM

## 2019-02-04 DIAGNOSIS — I5032 Chronic diastolic (congestive) heart failure: Secondary | ICD-10-CM | POA: Diagnosis not present

## 2019-02-04 DIAGNOSIS — E669 Obesity, unspecified: Secondary | ICD-10-CM

## 2019-02-04 DIAGNOSIS — Z7901 Long term (current) use of anticoagulants: Secondary | ICD-10-CM

## 2019-02-04 NOTE — Patient Instructions (Signed)
Medication Instructions:  Sande Rives, PA recommends that you continue on your current medications as directed. Please refer to the Current Medication list given to you today.  *If you need a refill on your cardiac medications before your next appointment, please call your pharmacy*  Lab Work: Please have labs tomorrow in [place].  If you have labs (blood work) drawn today and your tests are completely normal, you will receive your results only by: Marland Kitchen MyChart Message (if you have MyChart) OR . A paper copy in the mail If you have any lab test that is abnormal or we need to change your treatment, we will call you to review the results.  Follow-Up: At [facility], you and your health needs are our priority.  As part of our continuing mission to provide you with exceptional heart care, we have created designated Provider Care Teams.  These Care Teams include your primary Cardiologist (physician) and Advanced Practice Providers (APPs -  Physician Assistants and Nurse Practitioners) who all work together to provide you with the care you need, when you need it.  Your next appointment:   1 month(s)  The format for your next appointment:   In Person  Provider:   You may see Minus Breeding, MD or one of the following Advanced Practice Providers on your designated Care Team:    Rosaria Ferries, PA-C  Jory Sims, DNP, ANP  Cadence Kathlen Mody, NP

## 2019-02-04 NOTE — Telephone Encounter (Signed)
lmom for overdue inr 

## 2019-02-05 ENCOUNTER — Telehealth: Payer: Self-pay | Admitting: Cardiology

## 2019-02-05 NOTE — Telephone Encounter (Signed)
Left message for patient to call and schedule 1 month follow up appointment with Dr. Percival Spanish

## 2019-02-05 NOTE — Telephone Encounter (Signed)
    I left a message for pt to call and schedule an appt with Dr Warren Lacy for an in person visit in 1 month.

## 2019-02-19 ENCOUNTER — Other Ambulatory Visit
Admission: RE | Admit: 2019-02-19 | Discharge: 2019-02-19 | Disposition: A | Discharge status: Home / Self Care | Payer: Managed Care, Other (non HMO) | Source: Ambulatory Visit | Attending: Cardiology | Admitting: Cardiology

## 2019-02-19 ENCOUNTER — Other Ambulatory Visit: Payer: Self-pay

## 2019-02-19 ENCOUNTER — Encounter (HOSPITAL_COMMUNITY): Payer: Self-pay | Admitting: Emergency Medicine

## 2019-02-19 ENCOUNTER — Emergency Department (HOSPITAL_COMMUNITY): Payer: Managed Care, Other (non HMO)

## 2019-02-19 ENCOUNTER — Inpatient Hospital Stay (HOSPITAL_COMMUNITY)
Admission: EM | Admit: 2019-02-19 | Discharge: 2019-02-22 | DRG: 292 | Disposition: A | Discharge status: Home / Self Care | Payer: Managed Care, Other (non HMO) | Attending: Internal Medicine | Admitting: Internal Medicine

## 2019-02-19 DIAGNOSIS — R791 Abnormal coagulation profile: Secondary | ICD-10-CM | POA: Diagnosis present

## 2019-02-19 DIAGNOSIS — I509 Heart failure, unspecified: Secondary | ICD-10-CM

## 2019-02-19 DIAGNOSIS — J398 Other specified diseases of upper respiratory tract: Secondary | ICD-10-CM | POA: Diagnosis present

## 2019-02-19 DIAGNOSIS — E1151 Type 2 diabetes mellitus with diabetic peripheral angiopathy without gangrene: Secondary | ICD-10-CM | POA: Diagnosis present

## 2019-02-19 DIAGNOSIS — Z79899 Other long term (current) drug therapy: Secondary | ICD-10-CM

## 2019-02-19 DIAGNOSIS — Z7901 Long term (current) use of anticoagulants: Secondary | ICD-10-CM

## 2019-02-19 DIAGNOSIS — Z951 Presence of aortocoronary bypass graft: Secondary | ICD-10-CM

## 2019-02-19 DIAGNOSIS — I214 Non-ST elevation (NSTEMI) myocardial infarction: Secondary | ICD-10-CM

## 2019-02-19 DIAGNOSIS — E785 Hyperlipidemia, unspecified: Secondary | ICD-10-CM | POA: Diagnosis present

## 2019-02-19 DIAGNOSIS — E669 Obesity, unspecified: Secondary | ICD-10-CM | POA: Diagnosis present

## 2019-02-19 DIAGNOSIS — I272 Pulmonary hypertension, unspecified: Secondary | ICD-10-CM | POA: Diagnosis present

## 2019-02-19 DIAGNOSIS — I484 Atypical atrial flutter: Secondary | ICD-10-CM | POA: Diagnosis present

## 2019-02-19 DIAGNOSIS — I252 Old myocardial infarction: Secondary | ICD-10-CM

## 2019-02-19 DIAGNOSIS — R590 Localized enlarged lymph nodes: Secondary | ICD-10-CM | POA: Diagnosis present

## 2019-02-19 DIAGNOSIS — Z20822 Contact with and (suspected) exposure to covid-19: Secondary | ICD-10-CM | POA: Diagnosis present

## 2019-02-19 DIAGNOSIS — N179 Acute kidney failure, unspecified: Secondary | ICD-10-CM | POA: Diagnosis present

## 2019-02-19 DIAGNOSIS — I5033 Acute on chronic diastolic (congestive) heart failure: Secondary | ICD-10-CM | POA: Diagnosis not present

## 2019-02-19 DIAGNOSIS — Z89512 Acquired absence of left leg below knee: Secondary | ICD-10-CM

## 2019-02-19 DIAGNOSIS — Z86718 Personal history of other venous thrombosis and embolism: Secondary | ICD-10-CM

## 2019-02-19 DIAGNOSIS — E1142 Type 2 diabetes mellitus with diabetic polyneuropathy: Secondary | ICD-10-CM | POA: Diagnosis present

## 2019-02-19 DIAGNOSIS — E1165 Type 2 diabetes mellitus with hyperglycemia: Secondary | ICD-10-CM | POA: Diagnosis present

## 2019-02-19 DIAGNOSIS — K219 Gastro-esophageal reflux disease without esophagitis: Secondary | ICD-10-CM | POA: Diagnosis present

## 2019-02-19 DIAGNOSIS — I2510 Atherosclerotic heart disease of native coronary artery without angina pectoris: Secondary | ICD-10-CM | POA: Diagnosis present

## 2019-02-19 DIAGNOSIS — I480 Paroxysmal atrial fibrillation: Secondary | ICD-10-CM | POA: Diagnosis present

## 2019-02-19 DIAGNOSIS — I110 Hypertensive heart disease with heart failure: Principal | ICD-10-CM | POA: Diagnosis present

## 2019-02-19 DIAGNOSIS — J449 Chronic obstructive pulmonary disease, unspecified: Secondary | ICD-10-CM | POA: Diagnosis present

## 2019-02-19 DIAGNOSIS — Z955 Presence of coronary angioplasty implant and graft: Secondary | ICD-10-CM

## 2019-02-19 DIAGNOSIS — IMO0002 Reserved for concepts with insufficient information to code with codable children: Secondary | ICD-10-CM

## 2019-02-19 DIAGNOSIS — Z6835 Body mass index (BMI) 35.0-35.9, adult: Secondary | ICD-10-CM

## 2019-02-19 DIAGNOSIS — R42 Dizziness and giddiness: Secondary | ICD-10-CM | POA: Diagnosis present

## 2019-02-19 DIAGNOSIS — Z9119 Patient's noncompliance with other medical treatment and regimen: Secondary | ICD-10-CM

## 2019-02-19 DIAGNOSIS — I441 Atrioventricular block, second degree: Secondary | ICD-10-CM | POA: Diagnosis present

## 2019-02-19 DIAGNOSIS — Z86711 Personal history of pulmonary embolism: Secondary | ICD-10-CM

## 2019-02-19 DIAGNOSIS — J9601 Acute respiratory failure with hypoxia: Secondary | ICD-10-CM

## 2019-02-19 DIAGNOSIS — I248 Other forms of acute ischemic heart disease: Secondary | ICD-10-CM | POA: Diagnosis present

## 2019-02-19 DIAGNOSIS — R778 Other specified abnormalities of plasma proteins: Secondary | ICD-10-CM | POA: Diagnosis present

## 2019-02-19 DIAGNOSIS — R001 Bradycardia, unspecified: Secondary | ICD-10-CM | POA: Diagnosis present

## 2019-02-19 DIAGNOSIS — Z794 Long term (current) use of insulin: Secondary | ICD-10-CM

## 2019-02-19 DIAGNOSIS — Z8249 Family history of ischemic heart disease and other diseases of the circulatory system: Secondary | ICD-10-CM

## 2019-02-19 DIAGNOSIS — G4733 Obstructive sleep apnea (adult) (pediatric): Secondary | ICD-10-CM | POA: Diagnosis present

## 2019-02-19 DIAGNOSIS — R079 Chest pain, unspecified: Secondary | ICD-10-CM | POA: Diagnosis present

## 2019-02-19 DIAGNOSIS — Z888 Allergy status to other drugs, medicaments and biological substances status: Secondary | ICD-10-CM

## 2019-02-19 LAB — BASIC METABOLIC PANEL
Anion gap: 8 (ref 5–15)
Anion gap: 11 (ref 5–15)
BUN: 38 mg/dL — ABNORMAL HIGH (ref 8–23)
BUN: 34 mg/dL — ABNORMAL HIGH (ref 8–23)
CO2: 29 mmol/L (ref 22–32)
CO2: 25 mmol/L (ref 22–32)
Calcium: 9.2 mg/dL (ref 8.9–10.3)
Calcium: 9.3 mg/dL (ref 8.9–10.3)
Chloride: 101 mmol/L (ref 98–111)
Chloride: 103 mmol/L (ref 98–111)
Creatinine, Ser: 1.29 mg/dL — ABNORMAL HIGH (ref 0.61–1.24)
Creatinine, Ser: 1.46 mg/dL — ABNORMAL HIGH (ref 0.61–1.24)
GFR calc Af Amer: >60 mL/min (ref >60)
GFR calc Af Amer: 58 mL/min — ABNORMAL LOW (ref >60)
GFR calc non Af Amer: 58 mL/min — ABNORMAL LOW (ref >60)
GFR calc non Af Amer: 50 mL/min — ABNORMAL LOW (ref >60)
Glucose, Bld: 181 mg/dL — ABNORMAL HIGH (ref 70–99)
Glucose, Bld: 136 mg/dL — ABNORMAL HIGH (ref 70–99)
Potassium: 4.1 mmol/L (ref 3.5–5.1)
Potassium: 4.2 mmol/L (ref 3.5–5.1)
Sodium: 138 mmol/L (ref 135–145)
Sodium: 139 mmol/L (ref 135–145)

## 2019-02-19 LAB — TROPONIN I (HIGH SENSITIVITY): Troponin I (High Sensitivity): 173 ng/L (ref <18)

## 2019-02-19 LAB — CBC
HCT: 48.7 % (ref 39.0–52.0)
Hemoglobin: 15.6 g/dL (ref 13.0–17.0)
MCH: 28.4 pg (ref 26.0–34.0)
MCHC: 32 g/dL (ref 30.0–36.0)
MCV: 88.7 fL (ref 80.0–100.0)
Platelets: 185 10*3/uL (ref 150–400)
RBC: 5.49 MIL/uL (ref 4.22–5.81)
RDW: 15 % (ref 11.5–15.5)
WBC: 8.5 10*3/uL (ref 4.0–10.5)
nRBC: 0 % (ref 0.0–0.2)

## 2019-02-19 LAB — CBG MONITORING, ED: Glucose-Capillary: 122 mg/dL — ABNORMAL HIGH (ref 70–99)

## 2019-02-19 MED ORDER — SODIUM CHLORIDE 0.9% FLUSH: 3.0000 mL | Freq: Once | INTRAVENOUS | Status: DC

## 2019-02-19 NOTE — ED Notes (Signed)
Lab called this RN for critical troponin 173

## 2019-02-19 NOTE — ED Notes (Signed)
Pt complaining of dizziness, weakness, and feeling sweaty. Triage RN notified.

## 2019-02-19 NOTE — ED Triage Notes (Addendum)
Patient reports intermittent central chest pain with exertional dyspnea onset this week with lightheadedness , history of bradycardia , denies cough or fever .

## 2019-02-20 ENCOUNTER — Observation Stay (HOSPITAL_COMMUNITY): Payer: Managed Care, Other (non HMO)

## 2019-02-20 ENCOUNTER — Encounter (HOSPITAL_COMMUNITY): Payer: Self-pay | Admitting: Internal Medicine

## 2019-02-20 DIAGNOSIS — I252 Old myocardial infarction: Secondary | ICD-10-CM | POA: Diagnosis not present

## 2019-02-20 DIAGNOSIS — I214 Non-ST elevation (NSTEMI) myocardial infarction: Secondary | ICD-10-CM | POA: Diagnosis present

## 2019-02-20 DIAGNOSIS — I361 Nonrheumatic tricuspid (valve) insufficiency: Secondary | ICD-10-CM

## 2019-02-20 DIAGNOSIS — I509 Heart failure, unspecified: Secondary | ICD-10-CM

## 2019-02-20 DIAGNOSIS — I484 Atypical atrial flutter: Secondary | ICD-10-CM | POA: Diagnosis present

## 2019-02-20 DIAGNOSIS — I5031 Acute diastolic (congestive) heart failure: Secondary | ICD-10-CM

## 2019-02-20 DIAGNOSIS — I110 Hypertensive heart disease with heart failure: Secondary | ICD-10-CM | POA: Diagnosis present

## 2019-02-20 DIAGNOSIS — I5033 Acute on chronic diastolic (congestive) heart failure: Secondary | ICD-10-CM | POA: Diagnosis present

## 2019-02-20 DIAGNOSIS — Z20822 Contact with and (suspected) exposure to covid-19: Secondary | ICD-10-CM | POA: Diagnosis present

## 2019-02-20 DIAGNOSIS — R791 Abnormal coagulation profile: Secondary | ICD-10-CM | POA: Diagnosis present

## 2019-02-20 DIAGNOSIS — R079 Chest pain, unspecified: Secondary | ICD-10-CM | POA: Diagnosis not present

## 2019-02-20 DIAGNOSIS — R778 Other specified abnormalities of plasma proteins: Secondary | ICD-10-CM | POA: Diagnosis present

## 2019-02-20 DIAGNOSIS — J9601 Acute respiratory failure with hypoxia: Secondary | ICD-10-CM | POA: Diagnosis not present

## 2019-02-20 DIAGNOSIS — I248 Other forms of acute ischemic heart disease: Secondary | ICD-10-CM | POA: Diagnosis present

## 2019-02-20 DIAGNOSIS — K219 Gastro-esophageal reflux disease without esophagitis: Secondary | ICD-10-CM | POA: Diagnosis present

## 2019-02-20 DIAGNOSIS — R001 Bradycardia, unspecified: Secondary | ICD-10-CM | POA: Diagnosis present

## 2019-02-20 DIAGNOSIS — I6523 Occlusion and stenosis of bilateral carotid arteries: Secondary | ICD-10-CM

## 2019-02-20 DIAGNOSIS — E785 Hyperlipidemia, unspecified: Secondary | ICD-10-CM | POA: Diagnosis present

## 2019-02-20 DIAGNOSIS — I2510 Atherosclerotic heart disease of native coronary artery without angina pectoris: Secondary | ICD-10-CM | POA: Diagnosis present

## 2019-02-20 DIAGNOSIS — E1151 Type 2 diabetes mellitus with diabetic peripheral angiopathy without gangrene: Secondary | ICD-10-CM | POA: Diagnosis present

## 2019-02-20 DIAGNOSIS — E669 Obesity, unspecified: Secondary | ICD-10-CM | POA: Diagnosis present

## 2019-02-20 DIAGNOSIS — J449 Chronic obstructive pulmonary disease, unspecified: Secondary | ICD-10-CM | POA: Diagnosis present

## 2019-02-20 DIAGNOSIS — N179 Acute kidney failure, unspecified: Secondary | ICD-10-CM | POA: Diagnosis present

## 2019-02-20 DIAGNOSIS — I272 Pulmonary hypertension, unspecified: Secondary | ICD-10-CM | POA: Diagnosis present

## 2019-02-20 DIAGNOSIS — E1142 Type 2 diabetes mellitus with diabetic polyneuropathy: Secondary | ICD-10-CM | POA: Diagnosis present

## 2019-02-20 DIAGNOSIS — G4733 Obstructive sleep apnea (adult) (pediatric): Secondary | ICD-10-CM | POA: Diagnosis present

## 2019-02-20 DIAGNOSIS — E1165 Type 2 diabetes mellitus with hyperglycemia: Secondary | ICD-10-CM | POA: Diagnosis present

## 2019-02-20 DIAGNOSIS — R42 Dizziness and giddiness: Secondary | ICD-10-CM

## 2019-02-20 DIAGNOSIS — R590 Localized enlarged lymph nodes: Secondary | ICD-10-CM | POA: Diagnosis present

## 2019-02-20 DIAGNOSIS — I441 Atrioventricular block, second degree: Secondary | ICD-10-CM | POA: Diagnosis present

## 2019-02-20 DIAGNOSIS — I480 Paroxysmal atrial fibrillation: Secondary | ICD-10-CM | POA: Diagnosis present

## 2019-02-20 LAB — COMPREHENSIVE METABOLIC PANEL
ALT: 33 U/L (ref 0–44)
AST: 28 U/L (ref 15–41)
Albumin: 3.6 g/dL (ref 3.5–5.0)
Alkaline Phosphatase: 57 U/L (ref 38–126)
Anion gap: 13 (ref 5–15)
BUN: 36 mg/dL — ABNORMAL HIGH (ref 8–23)
CO2: 25 mmol/L (ref 22–32)
Calcium: 9.4 mg/dL (ref 8.9–10.3)
Chloride: 103 mmol/L (ref 98–111)
Creatinine, Ser: 1.36 mg/dL — ABNORMAL HIGH (ref 0.61–1.24)
GFR calc Af Amer: >60 mL/min (ref >60)
GFR calc non Af Amer: 55 mL/min — ABNORMAL LOW (ref >60)
Glucose, Bld: 125 mg/dL — ABNORMAL HIGH (ref 70–99)
Potassium: 4.2 mmol/L (ref 3.5–5.1)
Sodium: 141 mmol/L (ref 135–145)
Total Bilirubin: 1 mg/dL (ref 0.3–1.2)
Total Protein: 7.3 g/dL (ref 6.5–8.1)

## 2019-02-20 LAB — CBG MONITORING, ED: Glucose-Capillary: 179 mg/dL — ABNORMAL HIGH (ref 70–99)

## 2019-02-20 LAB — CBC
HCT: 47.7 % (ref 39.0–52.0)
Hemoglobin: 15.3 g/dL (ref 13.0–17.0)
MCH: 28.6 pg (ref 26.0–34.0)
MCHC: 32.1 g/dL (ref 30.0–36.0)
MCV: 89.2 fL (ref 80.0–100.0)
Platelets: 184 10*3/uL (ref 150–400)
RBC: 5.35 MIL/uL (ref 4.22–5.81)
RDW: 15.1 % (ref 11.5–15.5)
WBC: 8.9 10*3/uL (ref 4.0–10.5)
nRBC: 0 % (ref 0.0–0.2)

## 2019-02-20 LAB — HEMOGLOBIN A1C
Hgb A1c MFr Bld: 9.6 % — ABNORMAL HIGH (ref 4.8–5.6)
Mean Plasma Glucose: 228.82 mg/dL

## 2019-02-20 LAB — GLUCOSE, CAPILLARY
Glucose-Capillary: 181 mg/dL — ABNORMAL HIGH (ref 70–99)
Glucose-Capillary: 198 mg/dL — ABNORMAL HIGH (ref 70–99)

## 2019-02-20 LAB — TROPONIN I (HIGH SENSITIVITY)
Troponin I (High Sensitivity): 196 ng/L (ref <18)
Troponin I (High Sensitivity): 99 ng/L — ABNORMAL HIGH (ref <18)

## 2019-02-20 LAB — PROTIME-INR
INR: 2.2 — ABNORMAL HIGH (ref 0.8–1.2)
Prothrombin Time: 24.4 seconds — ABNORMAL HIGH (ref 11.4–15.2)

## 2019-02-20 LAB — BRAIN NATRIURETIC PEPTIDE: B Natriuretic Peptide: 259.4 pg/mL — ABNORMAL HIGH (ref 0.0–100.0)

## 2019-02-20 LAB — SARS CORONAVIRUS 2 (TAT 6-24 HRS): SARS Coronavirus 2: NEGATIVE

## 2019-02-20 LAB — ECHOCARDIOGRAM COMPLETE

## 2019-02-20 MED ORDER — INSULIN GLARGINE (1 UNIT DIAL) 300 UNIT/ML ~~LOC~~ SOPN: 70.0000 [IU] | PEN_INJECTOR | Freq: Every morning | SUBCUTANEOUS | Status: DC

## 2019-02-20 MED ORDER — INSULIN ASPART 100 UNIT/ML ~~LOC~~ SOLN
4.0000 [IU] | Freq: Three times a day (TID) | SUBCUTANEOUS | Status: DC
  Administered 2019-02-20 – 2019-02-22 (×4): 4 [IU] via SUBCUTANEOUS

## 2019-02-20 MED ORDER — SODIUM CHLORIDE 0.9% FLUSH: 3.0000 mL | INTRAVENOUS | Status: DC | PRN

## 2019-02-20 MED ORDER — INSULIN ASPART 100 UNIT/ML ~~LOC~~ SOLN: 0.0000 [IU] | Freq: Every day | SUBCUTANEOUS | Status: DC

## 2019-02-20 MED ORDER — RAMELTEON 8 MG PO TABS
8.0000 mg | ORAL_TABLET | Freq: Once | ORAL | Status: AC
  Administered 2019-02-21: 8 mg via ORAL
  Filled 2019-02-20 (×2): qty 1

## 2019-02-20 MED ORDER — SODIUM CHLORIDE 0.9% FLUSH
3.0000 mL | Freq: Two times a day (BID) | INTRAVENOUS | Status: DC
  Administered 2019-02-20 – 2019-02-21 (×3): 3 mL via INTRAVENOUS

## 2019-02-20 MED ORDER — POTASSIUM CHLORIDE CRYS ER 10 MEQ PO TBCR
10.0000 meq | EXTENDED_RELEASE_TABLET | Freq: Every morning | ORAL | Status: DC
  Administered 2019-02-20 – 2019-02-22 (×3): 10 meq via ORAL
  Filled 2019-02-20 (×3): qty 1

## 2019-02-20 MED ORDER — WARFARIN SODIUM 3 MG PO TABS
9.0000 mg | ORAL_TABLET | Freq: Once | ORAL | Status: AC
  Filled 2019-02-20: qty 3

## 2019-02-20 MED ORDER — PANTOPRAZOLE SODIUM 40 MG PO TBEC: 40.0000 mg | DELAYED_RELEASE_TABLET | Freq: Every evening | ORAL | Status: DC

## 2019-02-20 MED ORDER — ISOSORBIDE MONONITRATE ER 30 MG PO TB24
30.0000 mg | ORAL_TABLET | Freq: Every day | ORAL | Status: DC
  Administered 2019-02-20 – 2019-02-22 (×3): 30 mg via ORAL
  Filled 2019-02-20 (×3): qty 1

## 2019-02-20 MED ORDER — FUROSEMIDE 10 MG/ML IJ SOLN: 80.0000 mg | Freq: Two times a day (BID) | INTRAMUSCULAR | Status: DC

## 2019-02-20 MED ORDER — ACETAMINOPHEN 325 MG PO TABS
650.0000 mg | ORAL_TABLET | Freq: Four times a day (QID) | ORAL | Status: DC | PRN
  Filled 2019-02-20: qty 2

## 2019-02-20 MED ORDER — TORSEMIDE 20 MG PO TABS
40.0000 mg | ORAL_TABLET | Freq: Two times a day (BID) | ORAL | Status: DC
  Administered 2019-02-20: 40 mg via ORAL
  Filled 2019-02-20: qty 2

## 2019-02-20 MED ORDER — FUROSEMIDE 10 MG/ML IJ SOLN
40.0000 mg | Freq: Two times a day (BID) | INTRAMUSCULAR | Status: DC
  Filled 2019-02-20: qty 4

## 2019-02-20 MED ORDER — ATORVASTATIN CALCIUM 10 MG PO TABS
20.0000 mg | ORAL_TABLET | Freq: Every day | ORAL | Status: DC
  Administered 2019-02-20 – 2019-02-22 (×3): 20 mg via ORAL
  Filled 2019-02-20 (×3): qty 2

## 2019-02-20 MED ORDER — INSULIN ASPART 100 UNIT/ML ~~LOC~~ SOLN
0.0000 [IU] | Freq: Three times a day (TID) | SUBCUTANEOUS | Status: DC
  Administered 2019-02-20 – 2019-02-21 (×2): 3 [IU] via SUBCUTANEOUS
  Administered 2019-02-21: 17:00:00 2 [IU] via SUBCUTANEOUS

## 2019-02-20 MED ORDER — LOSARTAN POTASSIUM 25 MG PO TABS
25.0000 mg | ORAL_TABLET | Freq: Every day | ORAL | Status: DC
  Administered 2019-02-20 – 2019-02-22 (×3): 25 mg via ORAL
  Filled 2019-02-20 (×3): qty 1

## 2019-02-20 MED ORDER — SODIUM CHLORIDE 0.9 % IV SOLN: 250.0000 mL | INTRAVENOUS | Status: DC | PRN

## 2019-02-20 MED ORDER — FUROSEMIDE 10 MG/ML IJ SOLN
40.0000 mg | INTRAMUSCULAR | Status: AC
  Administered 2019-02-20: 02:00:00 40 mg via INTRAVENOUS
  Filled 2019-02-20: qty 4

## 2019-02-20 MED ORDER — WARFARIN - PHARMACIST DOSING INPATIENT: Freq: Every day | Status: DC

## 2019-02-20 MED ORDER — GABAPENTIN 300 MG PO CAPS: 600.0000 mg | ORAL_CAPSULE | Freq: Every evening | ORAL | Status: DC | PRN

## 2019-02-20 MED ORDER — INSULIN GLARGINE (1 UNIT DIAL) 300 UNIT/ML ~~LOC~~ SOPN: 25.0000 [IU] | PEN_INJECTOR | Freq: Two times a day (BID) | SUBCUTANEOUS | Status: DC

## 2019-02-20 MED ORDER — INSULIN GLARGINE 100 UNIT/ML ~~LOC~~ SOLN
25.0000 [IU] | Freq: Two times a day (BID) | SUBCUTANEOUS | Status: DC
  Administered 2019-02-20 – 2019-02-22 (×5): 25 [IU] via SUBCUTANEOUS
  Filled 2019-02-20 (×7): qty 0.25

## 2019-02-20 MED ORDER — ACETAMINOPHEN 650 MG RE SUPP: 650.0000 mg | Freq: Four times a day (QID) | RECTAL | Status: DC | PRN

## 2019-02-20 NOTE — H&P (Signed)
TRH H&P    Patient Demographics:    Bobby Lindsey, is a 64 y.o. male  MRN: [id number]  DOB - 12-23-1955  Admit Date - 02/19/2019  Referring MD/NP/PA:   Antonietta Breach  Outpatient Primary MD for the patient is Adin Hector, MD Minus Breeding - cardiology  Patient coming from:  home  Chief complaint- chest, pain, sob   HPI:    Bobby Lindsey  is a 64 y.o. male,  w hypertension, hyperlipidemia, Dm2, peripheral neuropathy, CAD s/p CABG 2002, STEMI 2015,  Pafib/ Flutter w h/o cardioversion, Bradycardia, CHD (diastolic), h/o recurrent DVT, PE, COPD, moderate pulmonary hypertension,  OSA noncompliant with cpap, L BKA apparently presents with c/o lightheadedness, chest pain, and sob.   Pt notes sob has been worse for several days.  The chest pain occurred while he was in the ED walking to the bathroom.  Pt points to the right side/ right mid area of his chest and states lasted for seconds.  Pt notes slight lightheadedness as well.  No recent change in medications per patient.  Pt notes he has lost weight.  No complaints of excessive leg swelling. Pt denies fever, chills, cough, alteration in sense of taste or smell , palp, orthopnea, n/v, abd pain, diarrhea, brbpr.   In ED,  T 98.3, P 43, R 18, Bp 131/73  Pox 95% on RA  CXR IMPRESSION: Cardiomegaly with possible mild vascular congestion. No focal consolidation.   Na 139, K 4.2, Bun 34, creatinine 1.46 Wbc 8.5, Hgb 15.6, Plt 185 BNP 259.4 INR 2.2  Trop 173-> 196  Ekg -> aflutter at 43, nl axis, no st-t changes c/w ischemia   Pt will be admitted for dyspnea secondary to mild acute on chronic CHF/ bradycardia, chest pain with mild elevation in troponin, and also dizziness secondary to bradycardia.     Review of systems:    In addition to the HPI above,  No Fever-chills, No Headache, No changes with Vision or hearing, No problems swallowing food or  Liquids, No  Cough   No Abdominal pain, No Nausea or Vomiting, bowel movements are regular, No Blood in stool or Urine, No dysuria, No new skin rashes or bruises, No new joints pains-aches,  No new weakness, tingling, numbness in any extremity, No recent weight gain or loss, No polyuria, polydypsia or polyphagia, No significant Mental Stressors.  All other systems reviewed and are negative.    Past History of the following :    Past Medical History:  Diagnosis Date  . Anginal pain ([place])    last pm  . Atrial fibrillation ([place])    a. Transient during 03/2013 admission.  . Bradycardia    a. Bradycardia/pauses/possible Mobitz II during 03/2013 adm. Not on BB due to this.  Marland Kitchen CAD (coronary artery disease)    a. s/p CABG 2002. b. Hx Cypher stent to the RCA. c. Inf-lat STEMI 03/2013:  LHC (04/05/13):  mLAD occluded, pD1 90, apical br of Dx occluded, CFX occluded, pOM1 90-95, RCA stents patent, diff RCA 30, S-Dx occluded,  S-PDA occluded, S-OM1 40-50, L-LAD patent, EF 40% with inf HK.  PCI:  Promus (2.5 x 28) DES to mid to dist CFX.  Marland Kitchen Charcot's joint of knee   . COPD (chronic obstructive pulmonary disease) ([place])   . Deep venous thrombosis (HCC)    right lower extremity  . Diabetes mellitus    a. A1C 10.7 in 03/2013.  . Diastolic CHF ([place])    a. EF 40% by cath, 55-60% during 03/2013 adm, required IV diuresis.  . DVT, lower extremity, recurrent ([place])    a. Hx recurrent DVT per record.  . Dyslipidemia   . Elevated CK    a. Pt has refused rheum workup in the past.  . GERD (gastroesophageal reflux disease)   . History of hiatal hernia   . HTN (hypertension)    x 15 years  . Hx of cardiovascular stress test    a. Lexiscan Myoview (03/2010):  diaph atten vs inf scar, no ischemia, EF 47%; Low Risk.  Marland Kitchen Hx of echocardiogram    a. Echo (04/08/13):  Mild LVH, EF 55-60%, restrictive physiology, severe LAE, mild reduced RVSF, mild RAE.  . Leg pain    ABI 6/16:  R 1.2, L 1.1 - normal  . Myocardial  infarction ([place])   . Obesity   . Peripheral neuropathy   . Pulmonary embolism ([place])   . Sleep apnea       Past Surgical History:  Procedure Laterality Date  . CARDIOVERSION N/A 06/19/2018   Procedure: CARDIOVERSION;  Surgeon: Acie Fredrickson, Wonda Cheng, MD;  Location: [place];  Service: Cardiovascular;  Laterality: N/A;  . CORONARY ANGIOPLASTY WITH STENT PLACEMENT    . CORONARY ARTERY BYPASS GRAFT     4 time since 2002  . LEFT HEART CATHETERIZATION WITH CORONARY/GRAFT ANGIOGRAM  04/05/2013   Procedure: LEFT HEART CATHETERIZATION WITH Beatrix Fetters;  Surgeon: Peter M Martinique, MD;  Location: [facility] CATH LAB;  Service: Cardiovascular;;  . left knee surgery    . LOWER EXTREMITY ANGIOGRAPHY Left 12/25/2016   Procedure: LOWER EXTREMITY ANGIOGRAPHY;  Surgeon: Algernon Huxley, MD;  Location: [place] CV LAB;  Service: Cardiovascular;  Laterality: Left;  . PERCUTANEOUS CORONARY STENT INTERVENTION (PCI-S)  04/05/2013   Procedure: PERCUTANEOUS CORONARY STENT INTERVENTION (PCI-S);  Surgeon: Peter M Martinique, MD;  Location: [facility] CATH LAB;  Service: Cardiovascular;;  DES to native Mid cx  . VASCULAR SURGERY        Social History:      Social History   Tobacco Use  . Smoking status: Never Smoker  . Smokeless tobacco: Never Used  Substance Use Topics  . Alcohol use: Yes    Comment: rare       Family History :     Family History  Problem Relation Age of Onset  . Heart disease Mother   . Hypertension Mother   . Cancer Father   . Hypertension Sister   . Heart attack Neg Hx   . Stroke Neg Hx        Home Medications:   Prior to Admission medications   Medication Sig Start Date End Date Taking? Authorizing Provider  acetaminophen (TYLENOL) 500 MG tablet Take 1,000 mg every 8 (eight) hours as needed by mouth for mild pain or headache.     [provider]  atorvastatin (LIPITOR) 20 MG tablet Take 1 tablet (20 mg total) by mouth daily. 04/03/18   Minus Breeding, MD  Blood  Glucose Monitoring Suppl (FIFTY50 GLUCOSE METER 2.0) w/Device KIT Use as  directed. 04/27/14   [provider]  gabapentin (NEURONTIN) 300 MG capsule Take 600 mg by mouth at bedtime as needed (pain in foot and ankle).     [provider]  Insulin Glargine, 1 Unit Dial, (TOUJEO SOLOSTAR) 300 UNIT/ML SOPN Inject 70 Units into the skin every morning. Patient takes Toujeo 70 units QAM; last dose on 06/17/18    [provider]  insulin lispro (HUMALOG KWIKPEN) 100 UNIT/ML injection Inject 36 Units into the skin 3 (three) times daily before meals.     [provider]  Insulin Syringe-Needle U-100 31G X 5/16" 0.3 ML MISC use as directed 02/12/08   [provider]  isosorbide mononitrate (IMDUR) 30 MG 24 hr tablet Take 1 tablet (30 mg total) by mouth daily. 04/03/18   Minus Breeding, MD  losartan (COZAAR) 25 MG tablet Take 1 tablet (25 mg total) by mouth daily. 04/03/18   Minus Breeding, MD  metFORMIN (GLUCOPHAGE-XR) 500 MG 24 hr tablet Take 500 mg by mouth daily with breakfast.    [provider]  metoprolol succinate (TOPROL-XL) 25 MG 24 hr tablet Take 0.5 tablets (12.5 mg total) by mouth daily. 04/03/18   Minus Breeding, MD  nitroGLYCERIN (NITROSTAT) 0.4 MG SL tablet Place 1 tablet (0.4 mg total) under the tongue every 5 (five) minutes as needed for chest pain. NEEDS APPOINTMENT 08/15/16   Burtis Junes, NP  pantoprazole (PROTONIX) 40 MG tablet Take 1 tablet (40 mg total) by mouth every evening. 12/03/18   Minus Breeding, MD  potassium chloride (K-DUR) 10 MEQ tablet Take 10 mEq by mouth every morning.    [provider]  torsemide (DEMADEX) 20 MG tablet Take 2 tablets (40 mg total) by mouth 2 (two) times daily. 02/01/19   Minus Breeding, MD  warfarin (COUMADIN) 5 MG tablet TAKE 1 TABLET (5 MG TOTAL) BY MOUTH AS DIRECTED. TAKE AS DIRECTED BY THE COUMADIN CLINIC. 10/08/18   Minus Breeding, MD     Allergies:     Allergies  Allergen Reactions    . Simvastatin Diarrhea and Other (See Comments)    Fatigue, diarrhea       Physical Exam:   Vitals  Blood pressure (!) 148/70, pulse (!) 40, temperature 97.9 F (36.6 C), temperature source Oral, resp. rate 20, SpO2 97 %.  1.  General: axoxo3 Slow speech  2. Psychiatric: euthymic  3. Neurologic: cn2-12 intact, reflexes 2+ symmetric, diffuse with no clonus, motor 5/5 in all ext  4. HEENMT:  Anicteric, pupils 1.76m symmetric, direct, consensual intact Neck: equivocal jvd  5. Respiratory : Faint crackles at bilateral base, no wheezing  6. Cardiovascular : rrr s1, s2, no m/g/r  7. Gastrointestinal:  Abd: soft, nt, nd, +bs  8. Skin:  Ext: no c/c/e, no rash, venous stasis change of the right distal lower ext L BKA  9.Musculoskeletal:  Good ROM    Data Review:    CBC Recent Labs  Lab 02/19/19 2056  WBC 8.5  HGB 15.6  HCT 48.7  PLT 185  MCV 88.7  MCH 28.4  MCHC 32.0  RDW 15.0   ------------------------------------------------------------------------------------------------------------------  Results for orders placed or performed during the hospital encounter of 02/19/19 (from the past 48 hour(s))  Basic metabolic panel     Status: Abnormal   Collection Time: 02/19/19  8:56 PM  Result Value Ref Range   Sodium 139 135 - 145 mmol/L   Potassium 4.2 3.5 - 5.1 mmol/L   Chloride 103 98 - 111 mmol/L  CO2 25 22 - 32 mmol/L   Glucose, Bld 136 (H) 70 - 99 mg/dL   BUN 34 (H) 8 - 23 mg/dL   Creatinine, Ser 1.46 (H) 0.61 - 1.24 mg/dL   Calcium 9.3 8.9 - 10.3 mg/dL   GFR calc non Af Amer 50 (L) >60 mL/min   GFR calc Af Amer 58 (L) >60 mL/min   Anion gap 11 5 - 15    Comment: Performed at [facility] [address], [place], [place] [address]  CBC     Status: None   Collection Time: 02/19/19  8:56 PM  Result Value Ref Range   WBC 8.5 4.0 - 10.5 K/uL   RBC 5.49 4.22 - 5.81 MIL/uL   Hemoglobin 15.6 13.0 - 17.0 g/dL   HCT 48.7 39.0 - 52.0 %   MCV  88.7 80.0 - 100.0 fL   MCH 28.4 26.0 - 34.0 pg   MCHC 32.0 30.0 - 36.0 g/dL   RDW 15.0 11.5 - 15.5 %   Platelets 185 150 - 400 K/uL   nRBC 0.0 0.0 - 0.2 %    Comment: Performed at [facility], [place] [address], [place], [place] [address]  Troponin I (High Sensitivity)     Status: Abnormal   Collection Time: 02/19/19  8:56 PM  Result Value Ref Range   Troponin I (High Sensitivity) 173 (HH) <18 ng/L    Comment: CRITICAL RESULT CALLED TO, READ BACK BY AND VERIFIED WITH: M COCHRANE RN AT 2205 ON [id number] BY K FORSYTH (NOTE) Elevated high sensitivity troponin I (hsTnI) values and significant  changes across serial measurements may suggest ACS but many other  chronic and acute conditions are known to elevate hsTnI results.  Refer to the Links section for chest pain algorithms and additional  guidance. Performed at [facility], [place] [address], [place], [place] [address]   Troponin I (High Sensitivity)     Status: Abnormal   Collection Time: 02/19/19 11:30 PM  Result Value Ref Range   Troponin I (High Sensitivity) 196 (HH) <18 ng/L    Comment: CRITICAL VALUE NOTED.  VALUE IS CONSISTENT WITH PREVIOUSLY REPORTED AND CALLED VALUE. (NOTE) Elevated high sensitivity troponin I (hsTnI) values and significant  changes across serial measurements may suggest ACS but many other  chronic and acute conditions are known to elevate hsTnI results.  Refer to the Links section for chest pain algorithms and additional  guidance. Performed at [facility], [place] [address] [place], [place] [address]   CBG monitoring, ED     Status: Abnormal   Collection Time: 02/19/19 11:30 PM  Result Value Ref Range   Glucose-Capillary 122 (H) 70 - 99 mg/dL  Brain natriuretic peptide     Status: Abnormal   Collection Time: 02/20/19  1:15 AM  Result Value Ref Range   B Natriuretic Peptide 259.4 (H) 0.0 - 100.0 pg/mL    Comment: Performed at [facility] [address], [place], [place] [address]   PT - INR     Status: Abnormal   Collection Time: 02/20/19  1:15 AM  Result Value Ref Range   Prothrombin Time 24.4 (H) 11.4 - 15.2 seconds   INR 2.2 (H) 0.8 - 1.2    Comment: (NOTE) INR goal varies based on device and disease states. Performed at [facility], [place] [address] [place],  [address]     Chemistries  Recent Labs  Lab 02/19/19 1031 02/19/19  2056  NA 138 139  K 4.1 4.2  CL 101 103  CO2 29 25  GLUCOSE 181* 136*  BUN 38* 34*  CREATININE 1.29* 1.46*  CALCIUM 9.2 9.3   ------------------------------------------------------------------------------------------------------------------  ------------------------------------------------------------------------------------------------------------------ GFR: CrCl cannot be calculated (Unknown ideal weight.). Liver Function Tests: No results for input(s): AST, ALT, ALKPHOS, BILITOT, PROT, ALBUMIN in the last 168 hours. No results for input(s): LIPASE, AMYLASE in the last 168 hours. No results for input(s): AMMONIA in the last 168 hours. Coagulation Profile: Recent Labs  Lab 02/20/19 0115  INR 2.2*   Cardiac Enzymes: No results for input(s): CKTOTAL, CKMB, CKMBINDEX, TROPONINI in the last 168 hours. BNP (last 3 results) No results for input(s): PROBNP in the last 8760 hours. HbA1C: No results for input(s): HGBA1C in the last 72 hours. CBG: Recent Labs  Lab 02/19/19 2330  GLUCAP 122*   Lipid Profile: No results for input(s): CHOL, HDL, LDLCALC, TRIG, CHOLHDL, LDLDIRECT in the last 72 hours. Thyroid Function Tests: No results for input(s): TSH, T4TOTAL, FREET4, T3FREE, THYROIDAB in the last 72 hours. Anemia Panel: No results for input(s): VITAMINB12, FOLATE, FERRITIN, TIBC, IRON, RETICCTPCT in the last 72 hours.  --------------------------------------------------------------------------------------------------------------- Urine analysis:    Component Value Date/Time   COLORURINE YELLOW  02/25/2018 1800   APPEARANCEUR CLEAR 02/25/2018 1800   LABSPEC 1.012 02/25/2018 1800   PHURINE 5.0 02/25/2018 1800   GLUCOSEU 150 (A) 02/25/2018 1800   HGBUR SMALL (A) 02/25/2018 1800   BILIRUBINUR NEGATIVE 02/25/2018 1800   KETONESUR NEGATIVE 02/25/2018 1800   PROTEINUR NEGATIVE 02/25/2018 1800   UROBILINOGEN 1.0 08/17/2010 2336   NITRITE NEGATIVE 02/25/2018 1800   LEUKOCYTESUR NEGATIVE 02/25/2018 1800      Imaging Results:    DG Chest 2 View  Result Date: 02/19/2019 CLINICAL DATA:  64 year old male with chest pain. EXAM: CHEST - 2 VIEW COMPARISON:  Chest radiograph dated 06/17/2018 FINDINGS: There is mild cardiomegaly with possible mild vascular congestion. No focal consolidation, pleural effusion or pneumothorax. Blunting of the left costophrenic angle similar to prior radiograph and CT, likely related to scarring. Median sternotomy wires. No acute osseous pathology. IMPRESSION: Cardiomegaly with possible mild vascular congestion. No focal consolidation. Electronically Signed   By: Anner Crete M.D.   On: 02/19/2019 21:36       Assessment & Plan:    Principal Problem:   Acute CHF (congestive heart failure) (HCC) Active Problems:   Chest pain   Dyspnea secondary to mild acute on chronic CHF (diastolic) As well as bradycardia Tele Trop I  Check cardiac echo Torsemide 101m po bid-> Lasix 433miv bid  Lightheadedness, secondary to Bradycardia Check carotid ultrasound STOP Toprol XL  Chest pain , atypical Elevated troponin Trop I  Check cardiac echo Cardiology consult requested through email for evaluation of elevated troponin  Bradycardia STOP Troprol XL  CAD s/p CABG 2002, STEMI 2015 PAfib/ Aflutter Tele Cont Lipitor 202mo qhs Cont Imdur 17m20m qday STOP Toprol XL as above Coumadin pharmacy to dose  Dm2 Cont Toujeo-> Lantus Hold Metformin  fsbs ac and qhs, ISS  Gerd Cont PPI  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  Code Status:  FULL CODE per patient, notified wife that patient admitted to [facility]  Admission status: Observation/: Based on patients clinical presentation and evaluation of above clinical data, I have made determination that patient meets observation criteria at this time.   Time spent in minutes : 55 minutes   Jani Gravel M.D on 02/20/2019 at 2:53 AM

## 2019-02-20 NOTE — Consult Note (Addendum)
Cardiology Consultation:   Patient ID: Bobby Lindsey MRN: [id number]; DOB: [birth year]  Admit date: 02/19/2019 Date of Consult: 02/20/2019  Primary Care Provider: Adin Hector, MD Primary Cardiologist: Minus Breeding, MD  Primary Electrophysiologist:  None    Patient Profile:   Bobby Lindsey is a 64 y.o. male with a hx of HTN, DM2, CAD s/p CABG 2002, STEMI 2015 with subsequent DES to LCx in 2015, paroxysmal atrial fibrillation s/p DCCV on coumadin, atypical aflutter, h/o of recurrent DVT and PE, moderate pulmonary hypertension, noncompliance, OSA, left BKA who is being seen today for the evaluation of afib/flutter at the request of Dr. Aileen Fass.  History of Present Illness:   [name] is followed by Dr. Percival Spanish. His most recent ischemic evaluation was a [facility] in 2015 (report not in epic) which showed severe 3 vessel CAD with patent LIMA to LAD and SVG to OM and patent stents to native RCA. SVG to diagonal and SVG to PDA were both occluded. Patient underwent successful PCI with DES to mid to distal native LCX with 90-95% stenosis.   The patient was admitted 06/2018 with acute on chronic diastolic CHF and was found to be in atrial flutter with slow ventricular rates. Echo during this admission showed LVEF of 60-65% with mild LVH, indeterminate diastolic dysfunction, mild left atrial enlargement, mild TR, and moderate pulmonary hypertension. He was diuresed and underwent DCCV. Following discharge he had a Zio monitor which showed atrial fibrillation with occasional slow ventricular rate and pauses as long as 3.8 seconds, asymptomatic. Sleep study was ordered but this was not approved.   He saw Dr. Percival Spanish 01/28/19 for SOB and lower leg edema. Demadex was increased to 60 mg BID for 3 days. Home O2 was ordered for low O2 sats. He had a virtual visit 02/04/19. At that appointment he was trying to restrict fluid and lower salt intake. He weight had been going down 270 > 264 lbs.   The  patient presented to the ED 02/20/19 for shortness of breath and lightheadedness. Symptoms started 2-3 weeks ago. He experienced progressive dyspnea on exertion and has to stop and catch his breath while walking. He had felt intermittent lightheadedness but yesterday it was worse than normal and started feeling dizzy as well. He had an episode of fleeting chest discomfort, 3/10/ Patient felt like he was gong to pass out at times but never did. He also reports mild orthopnea and lower leg swelling. Denies fever, cough, or recent illnesses.  He had been taking Torsemide 80 mg BID. He hasn't noticed much weight gain.   In the ED BP 141/78, pulse 43, afebrile, RR 17, 90% O2. Appeared dyspneic on exam. Labs showed glucose 136, creatinine 1.46 (baseline 1.10). BNP 259. INR 2.2. WBC 8.5,  Hgb 15.3 Hs troponin 173 > 196. EKG showed aflutter 43 bpm, 5:1 AV conduction, nonspecific T wave changes. CXR showed cardiomegaly with possible mild vascular congestion; no focal consolidation. Patient was admitted.   Patient denies tobacco/alcohol/durg use. He lives with his wife who helps take care of him. He tries to follow a low salt diet but sometimes eats bacon and sausage.    Heart Pathway Score:     Past Medical History:  Diagnosis Date   Anginal pain ([place])    last pm   Atrial fibrillation ([place])    a. Transient during 03/2013 admission.   Bradycardia    a. Bradycardia/pauses/possible Mobitz II during 03/2013 adm. Not on BB due to this.  CAD (coronary artery disease)    a. s/p CABG 2002. b. Hx Cypher stent to the RCA. c. Inf-lat STEMI 03/2013:  LHC (04/05/13):  mLAD occluded, pD1 90, apical br of Dx occluded, CFX occluded, pOM1 90-95, RCA stents patent, diff RCA 30, S-Dx occluded, S-PDA occluded, S-OM1 40-50, L-LAD patent, EF 40% with inf HK.  PCI:  Promus (2.5 x 28) DES to mid to dist CFX.   Charcot's joint of knee    COPD (chronic obstructive pulmonary disease) (HCC)    Deep venous thrombosis ([place])     right lower extremity   Diabetes mellitus    a. A1C 10.7 in 03/2013.   Diastolic CHF ([place])    a. EF 40% by cath, 55-60% during 03/2013 adm, required IV diuresis.   DVT, lower extremity, recurrent ([place])    a. Hx recurrent DVT per record.   Dyslipidemia    Elevated CK    a. Pt has refused rheum workup in the past.   GERD (gastroesophageal reflux disease)    History of hiatal hernia    HTN (hypertension)    x 15 years   Hx of cardiovascular stress test    a. Lexiscan Myoview (03/2010):  diaph atten vs inf scar, no ischemia, EF 47%; Low Risk.   Hx of echocardiogram    a. Echo (04/08/13):  Mild LVH, EF 55-60%, restrictive physiology, severe LAE, mild reduced RVSF, mild RAE.   Leg pain    ABI 6/16:  R 1.2, L 1.1 - normal   Myocardial infarction (HCC)    Obesity    Peripheral neuropathy    Pulmonary embolism ([employer])    Sleep apnea     Past Surgical History:  Procedure Laterality Date   CARDIOVERSION N/A 06/19/2018   Procedure: CARDIOVERSION;  Surgeon: Nahser, Wonda Cheng, MD;  Location: [facility];  Service: Cardiovascular;  Laterality: N/A;   CORONARY ANGIOPLASTY WITH STENT PLACEMENT     CORONARY ARTERY BYPASS GRAFT     4 time since 2002   LEFT HEART CATHETERIZATION WITH CORONARY/GRAFT ANGIOGRAM  04/05/2013   Procedure: LEFT HEART CATHETERIZATION WITH Beatrix Fetters;  Surgeon: Peter M Martinique, MD;  Location: [facility] C[facility] LAB;  Service: Cardiovascular;;   left knee surgery     LOWER EXTREMITY ANGIOGRAPHY Left 12/25/2016   Procedure: LOWER EXTREMITY ANGIOGRAPHY;  Surgeon: Algernon Huxley, MD;  Location: [facility] CV LAB;  Service: Cardiovascular;  Laterality: Left;   PERCUTANEOUS CORONARY STENT INTERVENTION (PCI-S)  04/05/2013   Procedure: PERCUTANEOUS CORONARY STENT INTERVENTION (PCI-S);  Surgeon: Peter M Martinique, MD;  Location: [facility] CATH LAB;  Service: Cardiovascular;;  DES to native Mid cx   VASCULAR SURGERY       Home Medications:  Prior to Admission  medications   Medication Sig Start Date End Date Taking? Authorizing Provider  acetaminophen (TYLENOL) 500 MG tablet Take 1,000 mg every 8 (eight) hours as needed by mouth for mild pain or headache.     [provider]  atorvastatin (LIPITOR) 20 MG tablet Take 1 tablet (20 mg total) by mouth daily. 04/03/18   Minus Breeding, MD  Blood Glucose Monitoring Suppl (FIFTY50 GLUCOSE METER 2.0) w/Device KIT Use as directed. 04/27/14   [provider]  gabapentin (NEURONTIN) 300 MG capsule Take 600 mg by mouth at bedtime as needed (pain in foot and ankle).     [provider]  Insulin Glargine, 1 Unit Dial, (TOUJEO SOLOSTAR) 300 UNIT/ML SOPN Inject 70 Units into the skin every morning. Patient takes Toujeo 34  units QAM; last dose on 06/17/18    [provider]  insulin lispro (HUMALOG KWIKPEN) 100 UNIT/ML injection Inject 36 Units into the skin 3 (three) times daily before meals.     [provider]  Insulin Syringe-Needle U-100 31G X 5/16" 0.3 ML MISC use as directed 02/12/08   [provider]  isosorbide mononitrate (IMDUR) 30 MG 24 hr tablet Take 1 tablet (30 mg total) by mouth daily. 04/03/18   Minus Breeding, MD  losartan (COZAAR) 25 MG tablet Take 1 tablet (25 mg total) by mouth daily. 04/03/18   Minus Breeding, MD  metFORMIN (GLUCOPHAGE-XR) 500 MG 24 hr tablet Take 500 mg by mouth daily with breakfast.    [provider]  metoprolol succinate (TOPROL-XL) 25 MG 24 hr tablet Take 0.5 tablets (12.5 mg total) by mouth daily. 04/03/18   Minus Breeding, MD  nitroGLYCERIN (NITROSTAT) 0.4 MG SL tablet Place 1 tablet (0.4 mg total) under the tongue every 5 (five) minutes as needed for chest pain. NEEDS APPOINTMENT 08/15/16   Burtis Junes, NP  pantoprazole (PROTONIX) 40 MG tablet Take 1 tablet (40 mg total) by mouth every evening. 12/03/18   Minus Breeding, MD  potassium chloride (K-DUR) 10 MEQ tablet Take 10 mEq by mouth every morning.    [provider]  torsemide (DEMADEX) 20 MG tablet Take 2 tablets (40 mg total) by mouth 2 (two) times daily. 02/01/19   Minus Breeding, MD  warfarin (COUMADIN) 5 MG tablet TAKE 1 TABLET (5 MG TOTAL) BY MOUTH AS DIRECTED. TAKE AS DIRECTED BY THE COUMADIN CLINIC. 10/08/18   Minus Breeding, MD    Inpatient Medications: Scheduled Meds:  atorvastatin  20 mg Oral Daily   furosemide  40 mg Intravenous BID   insulin aspart  0-15 Units Subcutaneous TID WC   insulin aspart  0-5 Units Subcutaneous QHS   insulin aspart  4 Units Subcutaneous TID WC   insulin glargine  25 Units Subcutaneous BID   isosorbide mononitrate  30 mg Oral Daily   losartan  25 mg Oral Daily   pantoprazole  40 mg Oral QPM   potassium chloride  10 mEq Oral q morning - 10a   sodium chloride flush  3 mL Intravenous Once   sodium chloride flush  3 mL Intravenous Q12H   torsemide  40 mg Oral BID   warfarin  9 mg Oral ONCE-1800   Warfarin - Pharmacist Dosing Inpatient   Does not apply q1800   Continuous Infusions:  sodium chloride     PRN Meds: sodium chloride, acetaminophen **OR** acetaminophen, gabapentin, sodium chloride flush  Allergies:    Allergies  Allergen Reactions   Simvastatin Diarrhea and Other (See Comments)    Fatigue, diarrhea      Social History:   Social History   Socioeconomic History   Marital status: Married    Spouse name: Not on file   Number of children: Not on file   Years of education: Not on file   Highest education level: Not on file  Occupational History   Occupation: CONSTRUCTION    Employer: [employer]    Comment: Clinical biochemist  Tobacco Use   Smoking status: Never Smoker   Smokeless tobacco: Never Used  Substance and Sexual Activity   Alcohol use: Yes    Comment: rare   Drug use: No   Sexual activity: Yes  Other Topics Concern   Not on file  Social History Narrative   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     Social Determinants of Health   Financial Resource Strain:    Difficulty of Paying Living Expenses: Not on file  Food Insecurity:    Worried About Charity fundraiser in the Last Year: Not on file   [employer] of Food in the Last Year: Not on file  Transportation Needs:    Lack of Transportation (Medical): Not on file   Lack of Transportation (Non-Medical): Not on file  Physical Activity:    Days of Exercise per Week: Not on file   Minutes of Exercise per Session: Not on file  Stress:    Feeling of Stress : Not on file  Social Connections:    Frequency of Communication with Friends and Family: Not on file   Frequency of Social Gatherings with Friends and Family: Not on file   Attends Religious Services: Not on file   Active Member of Clubs or Organizations: Not on file   Attends Archivist Meetings: Not on file   Marital Status: Not on file  Intimate Partner Violence:    Fear of Current or Ex-Partner: Not on file   Emotionally Abused: Not on file   Physically Abused: Not on file   Sexually Abused: Not on file    Family History:   Family History  Problem Relation Age of Onset   Heart disease Mother    Hypertension Mother    Cancer Father    Hypertension Sister    Heart attack Neg Hx    Stroke Neg Hx      ROS:  Please see the history of present illness.  All other ROS reviewed and negative.     Physical Exam/Data:   Vitals:   02/20/19 0500 02/20/19 0600 02/20/19 0615 02/20/19 0728  BP: 127/71 104/80 104/80   Pulse: (!) 46 (!) 55 (!) 41 (!) 46  Resp: 13 17 12 17   Temp:      TempSrc:      SpO2: 94% 94% 95%     Intake/Output Summary (Last 24 hours) at 02/20/2019 0811 Last data filed at 02/20/2019 0505 Gross per 24 hour  Intake --  Output 490 ml  Net -490 ml   Last 3 Weights 02/04/2019 01/28/2019 10/29/2018  Weight (lbs) 264 lb 3.2 oz 269 lb 267 lb  Weight (kg) 119.84 kg 122.018 kg 121.11 kg     There is no height or weight on  file to calculate BMI.  General:  Well nourished, well developed, in no acute distress HEENT: normal Lymph: no adenopathy Neck: no JVD Endocrine:  No thryomegaly Vascular: No carotid bruits; FA pulses 2+ bilaterally without bruits  Cardiac:  normal S1, S2; Reg Irreg; no murmur  Lungs:  Crackles at bases bilaterally; no wheezing, rhonchi  Abd: soft, nontender, no hepatomegaly  Ext: 1+ edema Musculoskeletal:  L BKA Skin: warm and dry  Neuro:  CNs 2-12 intact, no focal abnormalities noted Psych:  Normal affect   EKG:  The EKG was personally reviewed and demonstrates:  Aflutter, 43 bpm; 5:1 AV conduction, LAD; nonspecific T wave changes Telemetry:  Telemetry was personally reviewed and demonstrates:  Aflutter with 4:1 conduction. HR 40-50s  Relevant CV Studies:  Echocardiogram 06/18/2018: Impressions: 1. The left ventricle has normal systolic function, with an ejection fraction of 60-65%. The cavity size was mildly dilated. There is mildly increased left ventricular wall thickness. Left ventricular diastolic Doppler parameters are consistent with  indeterminate diastolic dysfunction. 2. The right ventricle has normal systolc  function. The cavity was normal. Right ventricular systolic pressure is moderately elevated with an estimated pressure of 56.5 mmHg. 3. Left atrial size was mildly dilated. 4. There is mild mitral annular calcification present. 5. The tricuspid valve was grossly normal. 6. The aortic valve is tricuspid Mild calcification of the aortic valve. No stenosis of the aortic valve. 7. The inferior vena cava was dilated in size with >50% respiratory variability. 8. Technically difficult; definity used; normal LV systolic function; mild LVH and LVE; mild LAE; mild TR; moderate pulmonary hypertension. _______________  Chauncy Passy 07/02/2018 to 07/06/2018: Predominant underlying rhythmAtrial fibrillation/flutter Pauses of 3.8 seconds longest are during the sleeping  hours.  No symptoms reported.  Laboratory Data:  High Sensitivity Troponin:   Recent Labs  Lab 02/19/19 2056 02/19/19 2330  TROPONINIHS 173* 196*     Chemistry Recent Labs  Lab 02/19/19 1031 02/19/19 2056 02/20/19 0448  NA 138 139 141  K 4.1 4.2 4.2  CL 101 103 103  CO2 29 25 25   GLUCOSE 181* 136* 125*  BUN 38* 34* 36*  CREATININE 1.29* 1.46* 1.36*  CALCIUM 9.2 9.3 9.4  GFRNONAA 58* 50* 55*  GFRAA >60 58* >60  ANIONGAP 8 11 13     Recent Labs  Lab 02/20/19 0448  PROT 7.3  ALBUMIN 3.6  AST 28  ALT 33  ALKPHOS 57  BILITOT 1.0   Hematology Recent Labs  Lab 02/19/19 2056 02/20/19 0448  WBC 8.5 8.9  RBC 5.49 5.35  HGB 15.6 15.3  HCT 48.7 47.7  MCV 88.7 89.2  MCH 28.4 28.6  MCHC 32.0 32.1  RDW 15.0 15.1  PLT 185 184   BNP Recent Labs  Lab 02/20/19 0115  BNP 259.4*    DDimer No results for input(s): DDIMER in the last 168 hours.   Radiology/Studies:  DG Chest 2 View  Result Date: 02/19/2019 CLINICAL DATA:  64 year old male with chest pain. EXAM: CHEST - 2 VIEW COMPARISON:  Chest radiograph dated 06/17/2018 FINDINGS: There is mild cardiomegaly with possible mild vascular congestion. No focal consolidation, pleural effusion or pneumothorax. Blunting of the left costophrenic angle similar to prior radiograph and CT, likely related to scarring. Median sternotomy wires. No acute osseous pathology. IMPRESSION: Cardiomegaly with possible mild vascular congestion. No focal consolidation. Electronically Signed   By: Anner Crete M.D.   On: 02/19/2019 21:36    Assessment and Plan:   Lightheadedness/Aflutter with ventricular bradycardia Patient presents with progressive SOB and lightheadedness found to be in alfutter with slow ventricular rates down to the 30s. CXR showed cardiomegaly with possible mild vascular congestion; no focal consolidation. BNP 259. HS troponin 173 > 196.  - Patient had an admission in 06/2018 for aflutter with ventricular rates in  the 40s and was successfully cardioverted.  - Coumadin per Pharmacy. INR today 2.2 - Metoprolol 12.5 mg BID held for bradycardia. Might have to discontinuation at discharge - Would consider repeat DCCV. Patient will likely need some diuresis before this. MD to see  Acute on Chronic diastolic HF Patient has progressive DOE. CXR with mild vascular congestion and BNP is mildly elevated. At baseline patient was taking Bumex 80 mg BID - Last echo 06/2018 showed preserved EF 60-65%, indeterminate DD, LA mildly dilated - Patient was started on Torsemide 40 mg BID - creatinine 1.46 > 1.36 - Patient does have 1+ edema and crackles at bases. I would give IV lasix while here  - Daily weights - Srict I/Os  Elevated Troponin - Patient reported fleeting  chest pain, 3/10 yesterday - Hs troponin 173 > 196. Will check a third troponin. - Suspect this is demand ischemia  DM2 - SSI per IM  CAD s/p CABG stenting 2015 - Continue Imdur, Lipitor - Metoprolol held   For questions or updates, please contact [place] Please consult [email or web address] for contact info under     Signed, Cadence Ninfa Meeker, PA-C  02/20/2019 8:11 AM   As above, patient seen and examined.  Briefly he is a 64 year old male with past medical history of hypertension, diabetes mellitus, coronary artery disease status post coronary artery bypass and graft, paroxysmal atrial fibrillation/flutter, history of recurrent DVT/pulmonary embolus, pulmonary hypertension, obstructive sleep apnea, left BKA for evaluation of atrial flutter and acute on chronic diastolic congestive heart failure.  Last echocardiogram May 2020 showed normal LV function, mild left ventricular hypertrophy, mild left atrial enlargement, moderate pulmonary hypertension.  Patient underwent cardioversion of atrial flutter May 2020.  Patient complains of increasing dyspnea on exertion for approximately 2 weeks.  He denies orthopnea.  Mild right lower extremity edema.  He has  occasional sharp pain in his chest with certain movements but no exertional chest pain.  No palpitations.  He had some dizziness yesterday but no syncope.  Cardiology asked to evaluate.  Chest x-ray shows mild vascular congestion.  BUN 36 creatinine 1.36.  BNP 259.  Troponin I 73 and 196.  Electrocardiogram shows atrial flutter with slow ventricular response, nonspecific ST changes.  1 acute on chronic diastolic congestive heart failure-symptoms likely exacerbated by atrial arrhythmias.  We will diurese with Lasix 80 mg IV twice daily and follow renal function.  Repeat echocardiogram.  2 atrial flutter-patient has recurrent atrial flutter.  He is on chronic Coumadin (CHADSvasc 4).  INR has been therapeutic.  He is bradycardic and we will hold metoprolol.  We will likely arrange cardioversion for tomorrow.  May need antiarrhythmic in the future.  However options will be somewhat limited.  He is bradycardic and amiodarone will likely exacerbate this. Tikosyn likely not a good choice given renal insufficiency. Not a candidate for flecainide given coronary artery disease.  3 bradycardia-hold metoprolol and follow.  Patient has a history of this in the past.  4 chest pain/elevated troponin-symptoms are extremely atypical and troponin minimally elevated but no clear trend.  We will not pursue further ischemia evaluation.  5 coronary artery disease-no aspirin given need for anticoagulation.  Would resume statin at discharge.  6 acute kidney injury-creatinine elevated compared to baseline; follow renal function closely with diuresis.  Kirk Ruths

## 2019-02-20 NOTE — Progress Notes (Signed)
[facility] PROGRESS NOTE    Progress Note  Bobby Lindsey  [id number] DOB: 1955/02/22 DOA: 02/19/2019 PCP: Bobby Hector, MD     Brief Narrative:   Bobby Lindsey is an 64 y.o. male past medical history of hypertension diabetes mellitus type 2, CAD status post CABG 2002, STEMI 2015, paroxysmal atrial fibrillation status post cardioversion on Coumadin, with a history of recurrent DVT and PE moderate pulmonary hypertension noncompliant with his obstructive sleep apnea and left BKA presents with lightheadedness chest pain and shortness of breath he relates his shortness of breath has been worsening for the last several days he cannot even call walk to the bathroom.  Assessment/Plan:   Lightheadedness likely due to atrial flutter with variable block and ventricular bradycardia: I have reviewed his telemetry strip he went down to 32, here in the ED.  There is likely the cause of his lightheadedness.  Went back through his discharge summary he was here on May 2020 for similar presentation with a flutter and AV block and ventricular tachycardia cardiology was consulted who cardioverted him and his symptoms resolved. I will go ahead and hold his metoprolol, continue monitoring telemetry. His BNP was 200 is unlikely to be a heart failure exacerbation his weight has been stable, today is 119 kg which is better than his previous weights. I really Lindsey believe he is not in heart failure his weight has not changed physical exam he appears euvolemic we will change his home dose of Lasix. Admitted to telemetry cycle cardiac enzymes.  A previous 2D echo in May 2020 showed an EF of 60% with mildly dilated cavity the left ventricular parameters are consistent with indeterminate diastolic dysfunction this echo was done with contrast. We will consult cardiology.    Chest pain: Twelve-lead EKG showed atrial flutter, with bradycardia. Cardiac biomarkers are mildly elevated, will consult cardiology.   Uncontrolled type 2 diabetes mellitus with insulin therapy ([place]) Blood glucose is fairly controlled, at home he is on long-acting insulin and short-acting. We will decrease his insulin and split into, start him on sliding scale.  CAD status post CABG and stenting 2015: Continue Lipitor Imdur metoprolol has been stopped.  GERD continue PPI.  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   DVT prophylaxis: coumadin Family Communication:none Disposition Plan/Barrier to D/C: unable to determine Code Status:     Code Status Orders  (From admission, onward)         Start     Ordered   02/20/19 0251  Full code  Continuous     02/20/19 0252        Code Status History    Date Active Date Inactive Code Status Order ID Comments User Context   06/17/2018 1953 06/20/2018 1935 Full Code [id number]  Bobby Lindsey Inpatient   11/23/2016 2308 11/26/2016 2240 Full Code [id number]  Bobby Grout, MD Inpatient   04/05/2013 1255 04/11/2013 1730 Full Code [id number]  Lindsey, Bobby M, MD Inpatient   Advance Care Planning Activity        IV Access:    Peripheral IV   Procedures and diagnostic studies:   DG Chest 2 View  Result Date: 02/19/2019 CLINICAL DATA:  64 year old male with chest pain. EXAM: CHEST - 2 VIEW COMPARISON:  Chest radiograph dated 06/17/2018 FINDINGS: There is mild cardiomegaly with possible mild vascular congestion. No focal consolidation, pleural effusion or pneumothorax. Blunting of the left costophrenic angle similar to prior radiograph and  CT, likely related to scarring. Median sternotomy wires. No acute osseous pathology. IMPRESSION: Cardiomegaly with possible mild vascular congestion. No focal consolidation. Electronically Signed   By: Bobby Lindsey Lindsey.D.   On: 02/19/2019 21:36     Medical Consultants:    None.  Anti-Infectives:   None  Subjective:    [name] he relates he  still has persistent lightheadedness specially when he sits up, relates his shortness of breath is slightly improved.  Objective:    Vitals:   02/20/19 0300 02/20/19 0400 02/20/19 0500 02/20/19 0600  BP: 133/67 (!) 148/131 127/71 104/80  Pulse: (!) 44 (!) 44 (!) 46 (!) 55  Resp: 16 11 13 17   Temp:      TempSrc:      SpO2: 95% 92% 94% 94%   SpO2: 94 % O2 Flow Rate (L/min): 2 L/min   Intake/Output Summary (Last 24 hours) at 02/20/2019 0707 Last data filed at 02/20/2019 0505 Gross per 24 hour  Intake -  Output 490 ml  Net -490 ml   There were no vitals filed for this visit.  Exam: General exam: In no acute distress, morbidly obese Respiratory system: Good air movement and clear to auscultation Cardiovascular system: S1 & S2 heard, RRR.  No appreciated JVD. Gastrointestinal system: Abdomen is nondistended, soft and nontender.  Central nervous system: Alert and oriented. No focal neurological deficits. Extremities: No pedal edema. Skin: No rashes, lesions or ulcers Psychiatry: Judgement and insight appear normal. Mood & affect appropriate.    Data Reviewed:    Labs: Basic Metabolic Panel: Recent Labs  Lab 02/19/19 1031 02/19/19 2056 02/20/19 0448  NA 138 139 141  K 4.1 4.2 4.2  CL 101 103 103  CO2 29 25 25   GLUCOSE 181* 136* 125*  BUN 38* 34* 36*  CREATININE 1.29* 1.46* 1.36*  CALCIUM 9.2 9.3 9.4   GFR CrCl cannot be calculated (Unknown ideal weight.). Liver Function Tests: Recent Labs  Lab 02/20/19 0448  AST 28  ALT 33  ALKPHOS 57  BILITOT 1.0  PROT 7.3  ALBUMIN 3.6   No results for input(s): LIPASE, AMYLASE in the last 168 hours. No results for input(s): AMMONIA in the last 168 hours. Coagulation profile Recent Labs  Lab 02/20/19 0115  INR 2.2*   COVID-19 Labs  No results for input(s): DDIMER, FERRITIN, LDH, CRP in the last 72 hours.  Lab Results  Component Value Date   SARSCOV2NAA NEGATIVE 02/20/2019   [place] NEGATIVE 06/17/2018     CBC: Recent Labs  Lab 02/19/19 2056 02/20/19 0448  WBC 8.5 8.9  HGB 15.6 15.3  HCT 48.7 47.7  MCV 88.7 89.2  PLT 185 184   Cardiac Enzymes: No results for input(s): CKTOTAL, CKMB, CKMBINDEX, TROPONINI in the last 168 hours. BNP (last 3 results) No results for input(s): PROBNP in the last 8760 hours. CBG: Recent Labs  Lab 02/19/19 2330  GLUCAP 122*   D-Dimer: No results for input(s): DDIMER in the last 72 hours. Hgb A1c: No results for input(s): HGBA1C in the last 72 hours. Lipid Profile: No results for input(s): CHOL, HDL, LDLCALC, TRIG, CHOLHDL, LDLDIRECT in the last 72 hours. Thyroid function studies: No results for input(s): TSH, T4TOTAL, T3FREE, THYROIDAB in the last 72 hours.  Invalid input(s): FREET3 Anemia work up: No results for input(s): VITAMINB12, FOLATE, FERRITIN, TIBC, IRON, RETICCTPCT in the last 72 hours. Sepsis Labs: Recent Labs  Lab 02/19/19 2056 02/20/19 0448  WBC 8.5 8.9   Microbiology Recent Results (from  the past 240 hour(s))  SARS CORONAVIRUS 2 (TAT 6-24 HRS) Nasopharyngeal Nasopharyngeal Swab     Status: None   Collection Time: 02/20/19  1:28 AM   Specimen: Nasopharyngeal Swab  Result Value Ref Range Status   SARS Coronavirus 2 NEGATIVE NEGATIVE Final    Comment: (NOTE) SARS-CoV-2 target nucleic acids are NOT DETECTED. The SARS-CoV-2 RNA is generally detectable in upper and lower respiratory specimens during the acute phase of infection. Negative results Lindsey not preclude SARS-CoV-2 infection, Lindsey not rule out co-infections with other pathogens, and should not be used as the sole basis for treatment or other patient management decisions. Negative results must be combined with clinical observations, patient history, and epidemiological information. The expected result is Negative. Fact Sheet for Patients: SugarRoll.be Fact Sheet for Healthcare Providers: https://www.woods-mathews.com/ This test is  not yet approved or cleared by the [place] FDA and  has been authorized for detection and/or diagnosis of SARS-CoV-2 by FDA under an Emergency Use Authorization (EUA). This EUA will remain  in effect (meaning this test can be used) for the duration of the COVID-19 declaration under Section 56 4(b)(1) of the Act, 21 U.S.C. section 360bbb-3(b)(1), unless the authorization is terminated or revoked sooner. Performed at [facility], [place] [address] [place],  [address]      Medications:   . atorvastatin  20 mg Oral Daily  . furosemide  40 mg Intravenous BID  . Insulin Glargine (1 Unit Dial)  70 Units Subcutaneous q morning - 10a  . isosorbide mononitrate  30 mg Oral Daily  . losartan  25 mg Oral Daily  . pantoprazole  40 mg Oral QPM  . potassium chloride  10 mEq Oral q morning - 10a  . sodium chloride flush  3 mL Intravenous Once  . sodium chloride flush  3 mL Intravenous Q12H  . warfarin  9 mg Oral ONCE-1800  . Warfarin - Pharmacist Dosing Inpatient   Does not apply q1800   Continuous Infusions: . sodium chloride        LOS: 0 days   Charlynne Cousins  [facility]  02/20/2019, 7:07 AM

## 2019-02-20 NOTE — Progress Notes (Signed)
ANTICOAGULATION CONSULT NOTE - Initial Consult  Pharmacy Consult for Coumadin Indication: atrial fibrillation, pulmonary embolus and DVT (history)  Allergies  Allergen Reactions  . Simvastatin Diarrhea and Other (See Comments)    Fatigue, diarrhea      Patient Measurements:     Vital Signs: Temp: 97.9 F (36.6 C) (01/07 0052) Temp Source: Oral (01/07 0052) BP: 104/80 (01/07 0600) Pulse Rate: 55 (01/07 0600)  Labs: Recent Labs    02/19/19 1031 02/19/19 2056 02/19/19 2330 02/20/19 0115 02/20/19 0448  HGB  --  15.6  --   --  15.3  HCT  --  48.7  --   --  47.7  PLT  --  185  --   --  184  LABPROT  --   --   --  24.4*  --   INR  --   --   --  2.2*  --   CREATININE 1.29* 1.46*  --   --   --   TROPONINIHS  --  173* 196*  --   --     CrCl cannot be calculated (Unknown ideal weight.).   Medical History: Past Medical History:  Diagnosis Date  . Anginal pain ([place])    last pm  . Atrial fibrillation ([place])    a. Transient during 03/2013 admission.  . Bradycardia    a. Bradycardia/pauses/possible Mobitz II during 03/2013 adm. Not on BB due to this.  Marland Kitchen CAD (coronary artery disease)    a. s/p CABG 2002. b. Hx Cypher stent to the RCA. c. Inf-lat STEMI 03/2013:  LHC (04/05/13):  mLAD occluded, pD1 90, apical br of Dx occluded, CFX occluded, pOM1 90-95, RCA stents patent, diff RCA 30, S-Dx occluded, S-PDA occluded, S-OM1 40-50, L-LAD patent, EF 40% with inf HK.  PCI:  Promus (2.5 x 28) DES to mid to dist CFX.  Marland Kitchen Charcot's joint of knee   . COPD (chronic obstructive pulmonary disease) ([place])   . Deep venous thrombosis ([facility])    right lower extremity  . Diabetes mellitus    a. A1C 10.7 in 03/2013.  . Diastolic CHF ([place])    a. EF 40% by cath, 55-60% during 03/2013 adm, required IV diuresis.  . DVT, lower extremity, recurrent ([place])    a. Hx recurrent DVT per record.  . Dyslipidemia   . Elevated CK    a. Pt has refused rheum workup in the past.  . GERD (gastroesophageal reflux  disease)   . History of hiatal hernia   . HTN (hypertension)    x 15 years  . Hx of cardiovascular stress test    a. Lexiscan Myoview (03/2010):  diaph atten vs inf scar, no ischemia, EF 47%; Low Risk.  Marland Kitchen Hx of echocardiogram    a. Echo (04/08/13):  Mild LVH, EF 55-60%, restrictive physiology, severe LAE, mild reduced RVSF, mild RAE.  . Leg pain    ABI 6/16:  R 1.2, L 1.1 - normal  . Myocardial infarction ([place])   . Obesity   . Peripheral neuropathy   . Pulmonary embolism ([place])   . Sleep apnea     Medications:  Coumadin PTA 9mg  daily except 5mg  Mon and Fri (per clinic records)  Assessment: 64 yo M presented with lightheadedness, chest pain, and SOB.  Patient on warfarin PTA for afib, hx DVT/PE.  INR therapeutic on home regimen.  Goal of Therapy:  INR 2-3 Monitor platelets by anticoagulation protocol: Yes   Plan:  Coumadin 9mg  PO x 1 tonight  [employer], Pharm.D., BCPS Clinical Pharmacist  **Pharmacist phone directory can be found on [email or web address] listed under [place].  02/20/2019 6:41 AM

## 2019-02-20 NOTE — Progress Notes (Signed)
Carotid duplex exam completed.  Preliminary results can be found under CV proc under chart review.  02/20/2019 11:06 AM  Daleen Squibb, RDMS, RVT

## 2019-02-20 NOTE — ED Notes (Signed)
Pt denies CP

## 2019-02-20 NOTE — Progress Notes (Signed)
  Echocardiogram 2D Echocardiogram has been performed.  Allah Reason A Stuart Mirabile 02/20/2019, 2:35 PM

## 2019-02-20 NOTE — ED Notes (Signed)
Pt given hygiene items

## 2019-02-20 NOTE — Progress Notes (Signed)
Baseline Pulse A-fib 30-50s

## 2019-02-20 NOTE — ED Provider Notes (Signed)
[facility] EMERGENCY DEPARTMENT Provider Note   CSN: [id number] Arrival date & time: 02/19/19  2015     History Chief Complaint  Patient presents with  . Chest Pain    Bobby Lindsey is a 64 y.o. male.   64 y/o male with hx of CAD s/p CABG in 2002 with subsequent DES to LCX in 2015, chronic dCHF, Afib on Coumadin, DVT and PE, COPD, HTN, hyperlipidemia, DM, GERD presents to the ED for c/o lightheadedness. Patient reports increased lightheadedness today - patient describes "dizziness", but states it is characterized more as "feeling like I'm going to pass out". He has not experienced any syncope today; however does report significant DOE and orthopnea. His chief complaint is listed as chest pain, but he denies any chest pain currently and further denies exertional chest pain. Patient goes on to note some associated diaphoresis. Has not had any recent fevers/cough. States his dry weight is 260-265. Was up to 56 around Christmas and started a "diet" on Monday; states weight today was around 264. Has been compliant with his daily medications including his Demedex.  Echo from 10/2018 showed LVEF of 60-65% with mild LVH   Chest Pain      Past Medical History:  Diagnosis Date  . Anginal pain ([place])    last pm  . Atrial fibrillation ([place])    a. Transient during 03/2013 admission.  . Bradycardia    a. Bradycardia/pauses/possible Mobitz II during 03/2013 adm. Not on BB due to this.  Marland Kitchen CAD (coronary artery disease)    a. s/p CABG 2002. b. Hx Cypher stent to the RCA. c. Inf-lat STEMI 03/2013:  LHC (04/05/13):  mLAD occluded, pD1 90, apical br of Dx occluded, CFX occluded, pOM1 90-95, RCA stents patent, diff RCA 30, S-Dx occluded, S-PDA occluded, S-OM1 40-50, L-LAD patent, EF 40% with inf HK.  PCI:  Promus (2.5 x 28) DES to mid to dist CFX.  Marland Kitchen Charcot's joint of knee   . COPD (chronic obstructive pulmonary disease) ([place])   . Deep venous thrombosis (H[facility])    right lower  extremity  . Diabetes mellitus    a. A1C 10.7 in 03/2013.  . Diastolic CHF ([place])    a. EF 40% by cath, 55-60% during 03/2013 adm, required IV diuresis.  . DVT, lower extremity, recurrent ([place])    a. Hx recurrent DVT per record.  . Dyslipidemia   . Elevated CK    a. Pt has refused rheum workup in the past.  . GERD (gastroesophageal reflux disease)   . History of hiatal hernia   . HTN (hypertension)    x 15 years  . Hx of cardiovascular stress test    a. Lexiscan Myoview (03/2010):  diaph atten vs inf scar, no ischemia, EF 47%; Low Risk.  Marland Kitchen Hx of echocardiogram    a. Echo (04/08/13):  Mild LVH, EF 55-60%, restrictive physiology, severe LAE, mild reduced RVSF, mild RAE.  . Leg pain    ABI 6/16:  R 1.2, L 1.1 - normal  . Myocardial infarction ([place])   . Obesity   . Peripheral neuropathy   . Pulmonary embolism ([place])   . Sleep apnea     Patient Active Problem List   Diagnosis Date Noted  . Coronary artery disease involving coronary bypass graft of native heart without angina pectoris 01/27/2019  . Chronic diastolic HF (heart failure) ([place]) 01/27/2019  . Educated about COVID-19 virus infection   . Atypical atrial flutter ([place])   .  Cellulitis of right lower extremity   . SOB (shortness of breath) 12/06/2017  . Uncontrolled type 2 diabetes mellitus with insulin therapy ([place]) 12/06/2017  . Primary osteoarthritis of left knee 11/02/2017  . Open wound 06/05/2017  . Diabetic ulcer of left lower leg ([place]) 05/16/2017  . Acquired absence of left leg below knee ([place]) 02/28/2017  . Impaired mobility and activities of daily living 02/15/2017  . Atherosclerotic heart disease of native coronary artery without angina pectoris 01/05/2017  . Essential hypertension 12/19/2016  . Atherosclerosis of native arteries of the extremities with ulceration ([place]) 12/19/2016  . Peripheral vascular disease ([place]) 12/07/2016  . CHF (congestive heart failure) ([place]) 11/24/2016  . Demand ischemia ([place])   . Old  inferior wall myocardial infarction 11/23/2016  . Stage 3 chronic kidney disease due to diabetes mellitus ([place]) 11/23/2016  . NSTEMI (non-ST elevated myocardial infarction) ([place]) 11/23/2016  . Acute diastolic CHF (congestive heart failure) ([place]) 11/23/2016  . Acute congestive heart failure ([place])   . Burn of left foot 11/21/2016  . Diabetic peripheral neuropathy ([place]) 11/21/2016  . Diabetic ulcer of right foot associated with type 2 diabetes mellitus, with fat layer exposed ([place]) 11/21/2016  . Diabetic peripheral vascular disease ([place]) 11/21/2016  . DDD (degenerative disc disease), lumbar 03/27/2016  . History of deep vein thrombosis (DVT) of lower extremity 03/26/2016  . History of pulmonary embolism 10/01/2015  . Nephropathy, diabetic ([place]) 10/01/2015  . 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 Chronic obstructive pulmonary disease, unspecified ([place]) 12/16/2013  . Medication management 04/14/2013  . History of recurrent DVT/E 04/11/2013  . Sleep apnea 04/11/2013  . Bradycardia 04/11/2013  . History of Elevated CK level  04/11/2013  . Class 1 obesity due to excess calories with serious comorbidity and body mass index (BMI) of 34.0 to 34.9 in adult 04/11/2013  . Peripheral neuropathy ([place])   . Charcot's arthropathy   . Heart failure, unspecified ([place]) 04/08/2013  . Paroxysmal atrial fibrillation ([place]) 04/07/2013  . Reactive depression (situational) 05/16/2011  . Long term (current) use of anticoagulants 09/21/2010  . 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 ED (erectile dysfunction) of organic origin 07/28/2010  . Hyperlipidemia 05/26/2008  . Hypertensive heart disease 05/26/2008  . Diabetes ([place]) 02/14/1991    Past Surgical History:  Procedure Laterality Date  . CARDIOVERSION N/A 06/19/2018   Procedure: CARDIOVERSION;  Surgeon: Acie Fredrickson, Wonda Cheng, MD;  Location: [facility];  Service: Cardiovascular;  Laterality: N/A;  . CORONARY ANGIOPLASTY WITH STENT PLACEMENT    .  CORONARY ARTERY BYPASS GRAFT     4 time since 2002  . LEFT HEART CATHETERIZATION WITH CORONARY/GRAFT ANGIOGRAM  04/05/2013   Procedure: LEFT HEART CATHETERIZATION WITH Beatrix Fetters;  Surgeon: Peter M Martinique, MD;  Location: [facility] CATH LAB;  Service: Cardiovascular;;  . left knee surgery    . LOWER EXTREMITY ANGIOGRAPHY Left 12/25/2016   Procedure: LOWER EXTREMITY ANGIOGRAPHY;  Surgeon: Algernon Huxley, MD;  Location: [facility] CV LAB;  Service: Cardiovascular;  Laterality: Left;  . PERCUTANEOUS CORONARY STENT INTERVENTION (PCI-S)  04/05/2013   Procedure: PERCUTANEOUS CORONARY STENT INTERVENTION (PCI-S);  Surgeon: Peter M Martinique, MD;  Location: [facility] CATH LAB;  Service: Cardiovascular;;  DES to native Mid cx  . VASCULAR SURGERY         Family History  Problem Relation Age of Onset  . Heart disease Mother   . Hypertension Mother   . Cancer Father   . Hypertension Sister   . Heart attack Neg Hx   . Stroke  Neg Hx     Social History   Tobacco Use  . Smoking status: Never Smoker  . Smokeless tobacco: Never Used  Substance Use Topics  . Alcohol use: Yes    Comment: rare  . Drug use: No    Home Medications Prior to Admission medications   Medication Sig Start Date End Date Taking? Authorizing Provider  acetaminophen (TYLENOL) 500 MG tablet Take 1,000 mg every 8 (eight) hours as needed by mouth for mild pain or headache.     [provider]  atorvastatin (LIPITOR) 20 MG tablet Take 1 tablet (20 mg total) by mouth daily. 04/03/18   Minus Breeding, MD  Blood Glucose Monitoring Suppl (FIFTY50 GLUCOSE METER 2.0) w/Device KIT Use as directed. 04/27/14   [provider]  gabapentin (NEURONTIN) 300 MG capsule Take 600 mg by mouth at bedtime as needed (pain in foot and ankle).     [provider]  Insulin Glargine, 1 Unit Dial, (TOUJEO SOLOSTAR) 300 UNIT/ML SOPN Inject 70 Units into the skin every morning. Patient takes Toujeo 70 units QAM; last dose on 06/17/18     [provider]  insulin lispro (HUMALOG KWIKPEN) 100 UNIT/ML injection Inject 36 Units into the skin 3 (three) times daily before meals.     [provider]  Insulin Syringe-Needle U-100 31G X 5/16" 0.3 ML MISC use as directed 02/12/08   [provider]  isosorbide mononitrate (IMDUR) 30 MG 24 hr tablet Take 1 tablet (30 mg total) by mouth daily. 04/03/18   Minus Breeding, MD  losartan (COZAAR) 25 MG tablet Take 1 tablet (25 mg total) by mouth daily. 04/03/18   Minus Breeding, MD  metFORMIN (GLUCOPHAGE-XR) 500 MG 24 hr tablet Take 500 mg by mouth daily with breakfast.    [provider]  metoprolol succinate (TOPROL-XL) 25 MG 24 hr tablet Take 0.5 tablets (12.5 mg total) by mouth daily. 04/03/18   Minus Breeding, MD  nitroGLYCERIN (NITROSTAT) 0.4 MG SL tablet Place 1 tablet (0.4 mg total) under the tongue every 5 (five) minutes as needed for chest pain. NEEDS APPOINTMENT 08/15/16   Burtis Junes, NP  pantoprazole (PROTONIX) 40 MG tablet Take 1 tablet (40 mg total) by mouth every evening. 12/03/18   Minus Breeding, MD  potassium chloride (K-DUR) 10 MEQ tablet Take 10 mEq by mouth every morning.    [provider]  torsemide (DEMADEX) 20 MG tablet Take 2 tablets (40 mg total) by mouth 2 (two) times daily. 02/01/19   Minus Breeding, MD  warfarin (COUMADIN) 5 MG tablet TAKE 1 TABLET (5 MG TOTAL) BY MOUTH AS DIRECTED. TAKE AS DIRECTED BY THE COUMADIN CLINIC. 10/08/18   Minus Breeding, MD    Allergies    Simvastatin  Review of Systems   Review of Systems  Cardiovascular: Positive for chest pain.  Ten systems reviewed and are negative for acute change, except as noted in the HPI.    Physical Exam Updated Vital Signs BP (!) 141/78 (BP Location: Right Arm)   Pulse (!) 43   Temp 97.9 F (36.6 C) (Oral)   Resp 17   SpO2 93%   Physical Exam Vitals and nursing note reviewed.  Constitutional:      General: He is not in acute distress.     Appearance: He is well-developed.     Comments: Obese. In NAD.  HENT:     Head: Normocephalic and atraumatic.  Eyes:     General: No scleral icterus.    Conjunctiva/sclera:  Conjunctivae normal.  Cardiovascular:     Rate and Rhythm: Regular rhythm. Bradycardia present.     Pulses: Normal pulses.     Comments: Monitor with atrial flutter; rate in the 40's Pulmonary:     Effort: Pulmonary effort is normal. No respiratory distress.     Breath sounds: No stridor.     Comments: Appears dyspneic without tachypnea. SpO2 91-94% on RA at rest. Was supposed to be on supplemental O2 PRN, per most recent cardiology note. Placed on 2L via [place] with sats improving to 98%.  Abdominal:     General: There is distension.     Palpations: Abdomen is soft.     Comments: Obese abdomen. Soft.  Musculoskeletal:        General: Normal range of motion.     Cervical back: Normal range of motion.     Comments: Minimal RLE edema. Left BKA.  Skin:    General: Skin is warm and dry.     Coloration: Skin is not pale.     Findings: No erythema or rash.  Neurological:     Mental Status: He is alert and oriented to person, place, and time.  Psychiatric:        Behavior: Behavior normal.     ED Results / Procedures / Treatments   Labs (all labs ordered are listed, but only abnormal results are displayed) Labs Reviewed  BASIC METABOLIC PANEL - Abnormal; Notable for the following components:      Result Value   Glucose, Bld 136 (*)    BUN 34 (*)    Creatinine, Ser 1.46 (*)    GFR calc non Af Amer 50 (*)    GFR calc Af Amer 58 (*)    All other components within normal limits  BRAIN NATRIURETIC PEPTIDE - Abnormal; Notable for the following components:   B Natriuretic Peptide 259.4 (*)    All other components within normal limits  PROTIME-INR - Abnormal; Notable for the following components:   Prothrombin Time 24.4 (*)    INR 2.2 (*)    All other components within normal limits  CBG MONITORING, ED -  Abnormal; Notable for the following components:   Glucose-Capillary 122 (*)    All other components within normal limits  TROPONIN I (HIGH SENSITIVITY) - Abnormal; Notable for the following components:   Troponin I (High Sensitivity) 173 (*)    All other components within normal limits  TROPONIN I (HIGH SENSITIVITY) - Abnormal; Notable for the following components:   Troponin I (High Sensitivity) 196 (*)    All other components within normal limits  SARS CORONAVIRUS 2 (TAT 6-24 HRS)  CBC  COMPREHENSIVE METABOLIC PANEL  CBC    EKG EKG Interpretation  Date/Time:  Thursday February 20 2019 00:55:28 EST Ventricular Rate:  43 PR Interval:    QRS Duration: 135 QT Interval:  522 QTC Calculation: 442 R Axis:   77 Text Interpretation: Atrial flutter Nonspecific intraventricular conduction delay Nonspecific T wave abnormality When compared with ecg dated 06/19/2018, atrial flutter has replaced sinus rhythm Confirmed by Veryl Speak [phone]) on 02/20/2019 1:15:39 AM   Radiology DG Chest 2 View  Result Date: 02/19/2019 CLINICAL DATA:  64 year old male with chest pain. EXAM: CHEST - 2 VIEW COMPARISON:  Chest radiograph dated 06/17/2018 FINDINGS: There is mild cardiomegaly with possible mild vascular congestion. No focal consolidation, pleural effusion or pneumothorax. Blunting of the left costophrenic angle similar to prior radiograph and CT, likely related to scarring. Median sternotomy wires. No acute  osseous pathology. IMPRESSION: Cardiomegaly with possible mild vascular congestion. No focal consolidation. Electronically Signed   By: Anner Crete M.D.   On: 02/19/2019 21:36    Procedures Procedures (including critical care time)  Cardiac Catheterization 04/05/2013: Final Conclusions: 1. Severe 3 vessel obstructive CAD 2. Patent LIMA to the LAD 3. Patent SVG to the OM 4. Occluded SVG to the diagonal 5. Occluded SVG to the PDA 6. Patent stents in the native RCA 7. Moderate LV  dysfunction 8. Successful stenting of the mid to distal native LCx with a DES. Given the small caliber of vessel and length of the lesion I did not feel a BMS was appropriate.  _______________  Echocardiogram 06/18/2018: Impressions: 1. The left ventricle has normal systolic function, with an ejection fraction of 60-65%. The cavity size was mildly dilated. There is mildly increased left ventricular wall thickness. Left ventricular diastolic Doppler parameters are consistent with  indeterminate diastolic dysfunction. 2. The right ventricle has normal systolc function. The cavity was normal. Right ventricular systolic pressure is moderately elevated with an estimated pressure of 56.5 mmHg. 3. Left atrial size was mildly dilated. 4. There is mild mitral annular calcification present. 5. The tricuspid valve was grossly normal. 6. The aortic valve is tricuspid Mild calcification of the aortic valve. No stenosis of the aortic valve. 7. The inferior vena cava was dilated in size with >50% respiratory variability. 8. Technically difficult; definity used; normal LV systolic function; mild LVH and LVE; mild LAE; mild TR; moderate pulmonary hypertension. _______________  Chauncy Passy 07/02/2018 to 07/06/2018: Predominant underlying rhythm Atrial fibrillation/flutter  Pauses of 3.8 seconds longest are during the sleeping hours.  No symptoms reported.    Medications Ordered in ED Medications  sodium chloride flush (NS) 0.9 % injection 3 mL (3 mLs Intravenous Not Given 02/20/19 0102)    ED Course  I have reviewed the triage vital signs and the nursing notes.   Pertinent labs & imaging results that were available during my care of the patient were reviewed by me and considered in my medical decision making (see chart for details).     MDM Rules/Calculators/A&P                       64 year old male presents to the emergency department for evaluation of lightheadedness as well as dyspnea  on exertion, orthopnea.  He has evidence of vascular congestion on chest x-ray as well as an elevated BNP.  His troponin was negative during admission in May 2020.  High-sensitivity troponin levels today are elevated up to 196.  Suspect this to be secondary to demand associated with CHF exacerbation.  Will require further trending.  His EKG today shows atrial flutter with bradycardic rate.  His cardiology notes reference ongoing bradycardia.  Historically, his rate has been in the 40s during prior admissions as well.  We will continue to monitor.  Dr. Maudie Mercury of [facility] to admit.   Final Clinical Impression(s) / ED Diagnoses Final diagnoses:  Acute on chronic diastolic congestive heart failure ([place])  NSTEMI (non-ST elevated myocardial infarction) [facility])    Rx / DC Orders ED Discharge Orders    None       Antonietta Breach, PA-C 02/20/19 0450    Veryl Speak, MD 02/20/19 [phone]

## 2019-02-21 ENCOUNTER — Encounter (HOSPITAL_COMMUNITY)
Admission: EM | Disposition: A | Discharge status: Home / Self Care | Payer: Self-pay | Source: Home / Self Care | Attending: Internal Medicine

## 2019-02-21 ENCOUNTER — Encounter (HOSPITAL_COMMUNITY): Payer: Self-pay | Admitting: Internal Medicine

## 2019-02-21 ENCOUNTER — Inpatient Hospital Stay (HOSPITAL_COMMUNITY): Payer: Managed Care, Other (non HMO) | Admitting: Anesthesiology

## 2019-02-21 DIAGNOSIS — R42 Dizziness and giddiness: Secondary | ICD-10-CM

## 2019-02-21 DIAGNOSIS — J9601 Acute respiratory failure with hypoxia: Secondary | ICD-10-CM

## 2019-02-21 DIAGNOSIS — R001 Bradycardia, unspecified: Secondary | ICD-10-CM

## 2019-02-21 DIAGNOSIS — E1165 Type 2 diabetes mellitus with hyperglycemia: Secondary | ICD-10-CM

## 2019-02-21 DIAGNOSIS — I484 Atypical atrial flutter: Secondary | ICD-10-CM

## 2019-02-21 DIAGNOSIS — Z794 Long term (current) use of insulin: Secondary | ICD-10-CM

## 2019-02-21 HISTORY — PX: CARDIOVERSION: SHX1299

## 2019-02-21 LAB — COMPREHENSIVE METABOLIC PANEL
ALT: 27 U/L (ref 0–44)
AST: 19 U/L (ref 15–41)
Albumin: 3.5 g/dL (ref 3.5–5.0)
Alkaline Phosphatase: 53 U/L (ref 38–126)
Anion gap: 8 (ref 5–15)
BUN: 34 mg/dL — ABNORMAL HIGH (ref 8–23)
CO2: 27 mmol/L (ref 22–32)
Calcium: 9.1 mg/dL (ref 8.9–10.3)
Chloride: 104 mmol/L (ref 98–111)
Creatinine, Ser: 1.3 mg/dL — ABNORMAL HIGH (ref 0.61–1.24)
GFR calc Af Amer: >60 mL/min (ref >60)
GFR calc non Af Amer: 58 mL/min — ABNORMAL LOW (ref >60)
Glucose, Bld: 164 mg/dL — ABNORMAL HIGH (ref 70–99)
Potassium: 3.8 mmol/L (ref 3.5–5.1)
Sodium: 139 mmol/L (ref 135–145)
Total Bilirubin: 1.6 mg/dL — ABNORMAL HIGH (ref 0.3–1.2)
Total Protein: 7 g/dL (ref 6.5–8.1)

## 2019-02-21 LAB — CBC
HCT: 45.8 % (ref 39.0–52.0)
Hemoglobin: 14.9 g/dL (ref 13.0–17.0)
MCH: 28.6 pg (ref 26.0–34.0)
MCHC: 32.5 g/dL (ref 30.0–36.0)
MCV: 87.9 fL (ref 80.0–100.0)
Platelets: 170 10*3/uL (ref 150–400)
RBC: 5.21 MIL/uL (ref 4.22–5.81)
RDW: 15 % (ref 11.5–15.5)
WBC: 7.2 10*3/uL (ref 4.0–10.5)
nRBC: 0 % (ref 0.0–0.2)

## 2019-02-21 LAB — GLUCOSE, CAPILLARY
Glucose-Capillary: 154 mg/dL — ABNORMAL HIGH (ref 70–99)
Glucose-Capillary: 109 mg/dL — ABNORMAL HIGH (ref 70–99)
Glucose-Capillary: 124 mg/dL — ABNORMAL HIGH (ref 70–99)
Glucose-Capillary: 162 mg/dL — ABNORMAL HIGH (ref 70–99)

## 2019-02-21 LAB — HEPARIN LEVEL (UNFRACTIONATED): Heparin Unfractionated: 0.35 IU/mL (ref 0.30–0.70)

## 2019-02-21 LAB — PROTIME-INR
INR: 1.7 — ABNORMAL HIGH (ref 0.8–1.2)
Prothrombin Time: 20.3 seconds — ABNORMAL HIGH (ref 11.4–15.2)

## 2019-02-21 SURGERY — CARDIOVERSION
Anesthesia: General

## 2019-02-21 MED ORDER — HEPARIN BOLUS VIA INFUSION
2000.0000 [IU] | Freq: Once | INTRAVENOUS | Status: DC
  Filled 2019-02-21: qty 2000

## 2019-02-21 MED ORDER — FLUTICASONE PROPIONATE 50 MCG/ACT NA SUSP
2.0000 | Freq: Every day | NASAL | Status: DC | PRN
  Administered 2019-02-21: 2 via NASAL
  Filled 2019-02-21: qty 16

## 2019-02-21 MED ORDER — LIDOCAINE HCL (CARDIAC) PF 100 MG/5ML IV SOSY
PREFILLED_SYRINGE | INTRAVENOUS | Status: DC | PRN
  Administered 2019-02-21: 60 mg via INTRATRACHEAL

## 2019-02-21 MED ORDER — WARFARIN SODIUM 2.5 MG PO TABS
12.5000 mg | ORAL_TABLET | Freq: Once | ORAL | Status: AC
  Administered 2019-02-21: 12.5 mg via ORAL
  Filled 2019-02-21: qty 1

## 2019-02-21 MED ORDER — SODIUM CHLORIDE 0.9 % IV SOLN: INTRAVENOUS | Status: DC | PRN

## 2019-02-21 MED ORDER — HEPARIN BOLUS VIA INFUSION
4000.0000 [IU] | Freq: Once | INTRAVENOUS | Status: AC
  Administered 2019-02-21: 4000 [IU] via INTRAVENOUS
  Filled 2019-02-21: qty 4000

## 2019-02-21 MED ORDER — PROPOFOL 10 MG/ML IV BOLUS
INTRAVENOUS | Status: DC | PRN
  Administered 2019-02-21: 60 mg via INTRAVENOUS

## 2019-02-21 MED ORDER — HEPARIN (PORCINE) 25000 UT/250ML-% IV SOLN
1600.0000 [IU]/h | INTRAVENOUS | Status: DC
  Administered 2019-02-21: 1500 [IU]/h via INTRAVENOUS
  Administered 2019-02-21: 1400 [IU]/h via INTRAVENOUS
  Filled 2019-02-21 (×2): qty 250

## 2019-02-21 MED ORDER — RAMELTEON 8 MG PO TABS
8.0000 mg | ORAL_TABLET | Freq: Every evening | ORAL | Status: DC | PRN
  Administered 2019-02-21: 8 mg via ORAL
  Filled 2019-02-21 (×2): qty 1

## 2019-02-21 MED ORDER — PANTOPRAZOLE SODIUM 40 MG PO TBEC
40.0000 mg | DELAYED_RELEASE_TABLET | Freq: Two times a day (BID) | ORAL | Status: DC
  Administered 2019-02-21 – 2019-02-22 (×3): 40 mg via ORAL
  Filled 2019-02-21 (×3): qty 1

## 2019-02-21 NOTE — Progress Notes (Signed)
Progress Note  Patient Name: Bobby Lindsey Date of Encounter: 02/21/2019  Primary Cardiologist: Minus Breeding, MD   Subjective   Still with weakness and DOE; no chest pain  Inpatient Medications    Scheduled Meds: . atorvastatin  20 mg Oral Daily  . furosemide  80 mg Intravenous BID  . heparin  2,000 Units Intravenous Once  . insulin aspart  0-15 Units Subcutaneous TID WC  . insulin aspart  0-5 Units Subcutaneous QHS  . insulin aspart  4 Units Subcutaneous TID WC  . insulin glargine  25 Units Subcutaneous BID  . isosorbide mononitrate  30 mg Oral Daily  . losartan  25 mg Oral Daily  . pantoprazole  40 mg Oral BID  . potassium chloride  10 mEq Oral q morning - 10a  . sodium chloride flush  3 mL Intravenous Once  . sodium chloride flush  3 mL Intravenous Q12H  . warfarin  12.5 mg Oral ONCE-1800  . warfarin  9 mg Oral ONCE-1800  . Warfarin - Pharmacist Dosing Inpatient   Does not apply q1800   Continuous Infusions: . sodium chloride    . heparin     PRN Meds: sodium chloride, acetaminophen **OR** acetaminophen, gabapentin, sodium chloride flush   Vital Signs    Vitals:   02/21/19 0523 02/21/19 0524 02/21/19 0755 02/21/19 0821  BP:  (!) 173/87 132/62 (!) 156/66  Pulse:  (!) 47 (!) 42 (!) 42  Resp:   18   Temp:  (!) 97.4 F (36.3 C) 98.4 F (36.9 C)   TempSrc:  Oral Oral   SpO2:  93% 91%   Weight: 118 kg     Height:        Intake/Output Summary (Last 24 hours) at 02/21/2019 1017 Last data filed at 02/21/2019 0711 Gross per 24 hour  Intake 480 ml  Output 2125 ml  Net -1645 ml   Last 3 Weights 02/21/2019 02/20/2019 02/04/2019  Weight (lbs) 260 lb 3.2 oz 259 lb 7.7 oz 264 lb 3.2 oz  Weight (kg) 118.026 kg 117.7 kg 119.84 kg      Telemetry    Atrial flutter with slow ventricular response- Personally Reviewed  Physical Exam   GEN: No acute distress.   Neck: No JVD Cardiac: irregular and bradycardic Respiratory: Clear to auscultation bilaterally. GI:  Soft, nontender, non-distended  MS: S/P left BKA; RLE with trace edema Neuro:  Nonfocal  Psych: Normal affect   Labs    High Sensitivity Troponin:   Recent Labs  Lab 02/19/19 2056 02/19/19 2330 02/20/19 1142  TROPONINIHS 173* 196* 99*      Chemistry Recent Labs  Lab 02/19/19 2056 02/20/19 0448 02/21/19 0421  NA 139 141 139  K 4.2 4.2 3.8  CL 103 103 104  CO2 25 25 27   GLUCOSE 136* 125* 164*  BUN 34* 36* 34*  CREATININE 1.46* 1.36* 1.30*  CALCIUM 9.3 9.4 9.1  PROT  --  7.3 7.0  ALBUMIN  --  3.6 3.5  AST  --  28 19  ALT  --  33 27  ALKPHOS  --  57 53  BILITOT  --  1.0 1.6*  GFRNONAA 50* 55* 58*  GFRAA 58* >60 >60  ANIONGAP 11 13 8      Hematology Recent Labs  Lab 02/19/19 2056 02/20/19 0448 02/21/19 0421  WBC 8.5 8.9 7.2  RBC 5.49 5.35 5.21  HGB 15.6 15.3 14.9  HCT 48.7 47.7 45.8  MCV 88.7 89.2 87.9  MCH  28.4 28.6 28.6  MCHC 32.0 32.1 32.5  RDW 15.0 15.1 15.0  PLT 185 184 170    BNP Recent Labs  Lab 02/20/19 0115  BNP 259.4*     Radiology    DG Chest 2 View  Result Date: 02/19/2019 CLINICAL DATA:  64 year old male with chest pain. EXAM: CHEST - 2 VIEW COMPARISON:  Chest radiograph dated 06/17/2018 FINDINGS: There is mild cardiomegaly with possible mild vascular congestion. No focal consolidation, pleural effusion or pneumothorax. Blunting of the left costophrenic angle similar to prior radiograph and CT, likely related to scarring. Median sternotomy wires. No acute osseous pathology. IMPRESSION: Cardiomegaly with possible mild vascular congestion. No focal consolidation. Electronically Signed   By: Anner Crete M.D.   On: 02/19/2019 21:36   ECHOCARDIOGRAM COMPLETE  Result Date: 02/20/2019   ECHOCARDIOGRAM REPORT   Patient Name:   Bobby Lindsey Date of Exam: 02/20/2019 Medical Rec #:  [id number]       Height:       72.0 in Accession #:    [id number]      Weight:       264.2 lb Date of Birth:  1955-05-10      BSA:          2.40 m Patient Age:     64 years        BP:           143/91 mmHg Patient Gender: M               HR:           50 bpm. Exam Location:  Inpatient Procedure: 2D Echo Indications:    Atrial Flutter 427.32 / I48.92  History:        Patient has prior history of Echocardiogram examinations, most                 recent 07/03/2018. CHF, Arrythmias:Bradycardia,                 Signs/Symptoms:Chest Pain; Risk Factors:Obesity, Hypertension,                 Dyslipidemia and Diabetes. CKD                 History of pulmonary embolism.  Sonographer:    Vikki Ports Turrentine Referring Phys: [id number] [place]  Sonographer Comments: Image acquisition challenging due to uncooperative patient. Unable to complete exam. Patient asked to end exam. IMPRESSIONS  1. Left ventricular ejection fraction, by visual estimation, is 55 to 60%. The left ventricle has normal function. There is no left ventricular hypertrophy.  2. Left ventricular diastolic function could not be evaluated.  3. Mildly dilated left ventricular internal cavity size.  4. The left ventricle has no regional wall motion abnormalities.  5. Global right ventricle has normal systolic function.The right ventricular size is normal. No increase in right ventricular wall thickness.  6. Left atrial size was mildly dilated.  7. Right atrial size was moderately dilated.  8. The pericardium was not well visualized.  9. Mild mitral annular calcification. 10. The mitral valve is normal in structure. Trivial mitral valve regurgitation. 11. The tricuspid valve is normal in structure. 12. The aortic valve has an indeterminant number of cusps. Aortic valve regurgitation is not visualized. No evidence of aortic valve sclerosis or stenosis. 13. The pulmonic valve was grossly normal. Pulmonic valve regurgitation is not visualized. 14. Severely elevated pulmonary artery systolic pressure. 15. Patient appears to be in rate controlled  atrial flutter throughout study. Study ended early due to patient request. 16. The  interatrial septum was not assessed. FINDINGS  Left Ventricle: Left ventricular ejection fraction, by visual estimation, is 55 to 60%. The left ventricle has normal function. The left ventricle has no regional wall motion abnormalities. The left ventricular internal cavity size was mildly dilated left ventricle. There is no left ventricular hypertrophy. Left ventricular diastolic function could not be evaluated. Right Ventricle: The right ventricular size is normal. No increase in right ventricular wall thickness. Global RV systolic function is has normal systolic function. The tricuspid regurgitant velocity is 3.89 m/s, and with an assumed right atrial pressure  of 8 mmHg, the estimated right ventricular systolic pressure is severely elevated at 68.5 mmHg. Left Atrium: Left atrial size was mildly dilated. Right Atrium: Right atrial size was moderately dilated Pericardium: The pericardium was not well visualized. Mitral Valve: The mitral valve is normal in structure. Mild mitral annular calcification. Trivial mitral valve regurgitation. Tricuspid Valve: The tricuspid valve is normal in structure. Tricuspid valve regurgitation is mild. Aortic Valve: The aortic valve has an indeterminant number of cusps. Aortic valve regurgitation is not visualized. The aortic valve is structurally normal, with no evidence of sclerosis or stenosis. Pulmonic Valve: The pulmonic valve was grossly normal. Pulmonic valve regurgitation is not visualized. Pulmonic regurgitation is not visualized. Aorta: The aortic root and ascending aorta are structurally normal, with no evidence of dilitation. Pulmonary Artery: The pulmonary artery is not well seen. Venous: The inferior vena cava was not well visualized. IAS/Shunts: The interatrial septum was not assessed.  LEFT VENTRICLE PLAX 2D LVIDd:         5.63 cm  Diastology LVIDs:         3.93 cm  LV e' lateral:   11.80 cm/s LV PW:         1.09 cm  LV E/e' lateral: 9.7 LV IVS:        1.06 cm LVOT  diam:     2.10 cm LV SV:         88 ml LV SV Index:   35.24 LVOT Area:     3.46 cm  LEFT ATRIUM           Index       RIGHT ATRIUM           Index LA diam:      5.70 cm 2.38 cm/m  RA Area:     28.30 cm LA Vol (A4C): 77.8 ml 32.44 ml/m RA Volume:   102.00 ml 42.53 ml/m  AORTIC VALVE LVOT Vmax:   99.90 cm/s LVOT Vmean:  68.100 cm/s LVOT VTI:    0.203 m  AORTA Ao Root diam: 2.90 cm MITRAL VALVE                         TRICUSPID VALVE MV Area (PHT): 3.27 cm              TR Peak grad:   60.5 mmHg MV PHT:        67.28 msec            TR Vmax:        389.00 cm/s MV Decel Time: 232 msec MV E velocity: 114.00 cm/s 103 cm/s  SHUNTS MV A velocity: 77.60 cm/s  70.3 cm/s Systemic VTI:  0.20 m MV E/A ratio:  1.47        1.5       Systemic  Diam: 2.10 cm  Buford Dresser MD Electronically signed by Buford Dresser MD Signature Date/Time: 02/20/2019/7:30:17 PM    Final    VAS US CAROTID  Result Date: 02/20/2019 Carotid Arterial Duplex Study Indications:       Dizziness. Risk Factors:      Hypertension, Diabetes, coronary artery disease. Limitations        Today's exam was limited due to the patient's inability or                    unwillingness to cooperate. Comparison Study:  No prior study. Performing Technologist: Baldwin Crown ARDMS, RVT  Examination Guidelines: A complete evaluation includes B-mode imaging, spectral Doppler, color Doppler, and power Doppler as needed of all accessible portions of each vessel. Bilateral testing is considered an integral part of a complete examination. Limited examinations for reoccurring indications may be performed as noted.  Right Carotid Findings: +----------+--------+--------+--------+------------------+------------------+           PSV cm/sEDV cm/sStenosisPlaque DescriptionComments           +----------+--------+--------+--------+------------------+------------------+ CCA Prox  101     10                                                    +----------+--------+--------+--------+------------------+------------------+ CCA Distal93      14                                intimal thickening +----------+--------+--------+--------+------------------+------------------+ ICA Prox  66      10      1-39%   focal and calcific                   +----------+--------+--------+--------+------------------+------------------+ ICA Distal79      13                                                   +----------+--------+--------+--------+------------------+------------------+ ECA       145     14              focal and calcific                   +----------+--------+--------+--------+------------------+------------------+ +----------+--------+-------+----------------+-------------------+           PSV cm/sEDV cmsDescribe        Arm Pressure (mmHG) +----------+--------+-------+----------------+-------------------+ [id number]     22     Multiphasic, WNL                    +----------+--------+-------+----------------+-------------------+ +---------+--------+--+--------+-+---------+ VertebralPSV cm/s41EDV cm/s8Antegrade +---------+--------+--+--------+-+---------+  Left Carotid Findings: +----------+--------+--------+--------+------------------+--------+           PSV cm/sEDV cm/sStenosisPlaque DescriptionComments +----------+--------+--------+--------+------------------+--------+ CCA Prox  137     19                                         +----------+--------+--------+--------+------------------+--------+ CCA Distal92      11              calcific                   +----------+--------+--------+--------+------------------+--------+  ICA Prox  96      15      1-39%                              +----------+--------+--------+--------+------------------+--------+ ICA Distal80      11                                         +----------+--------+--------+--------+------------------+--------+ ECA        141     16                                         +----------+--------+--------+--------+------------------+--------+ +----------+--------+--------+------------+-------------------+           PSV cm/sEDV cm/sDescribe    Arm Pressure (mmHG) +----------+--------+--------+------------+-------------------+ Subclavian                Not assessed                    +----------+--------+--------+------------+-------------------+ +---------+--------+--------+------------+ VertebralPSV cm/sEDV cm/sNot assessed +---------+--------+--------+------------+ Left vertebral and subclavian arteries not assessed due to patient unwilling to hold still and demanded to stop exam. Summary: Right Carotid: Velocities in the right ICA are consistent with a 1-39% stenosis. Left Carotid: Velocities in the left ICA are consistent with a 1-39% stenosis. Vertebrals:  Right vertebral artery demonstrates antegrade flow. Subclavians: Normal flow hemodynamics were seen in the right subclavian artery. *See table(s) above for measurements and observations.  Electronically signed by Monica Martinez MD on 02/20/2019 at 5:08:24 PM.    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  Echocardiogram this admission shows ejection fraction 55 to 60%, mild left atrial enlargement, moderate right atrial enlargement, severely elevated pulmonary pressure.   Assessment & Plan    1 acute on chronic diastolic congestive heart failure-symptoms likely exacerbated by atrial arrhythmias.  He appears to be euvolemic on examination today.  We will hold on further IV diuresis  and likely resume home dose of Demadex tomorrow morning.  2 atrial flutter-patient has recurrent atrial flutter.  He has been on chronic Coumadin (CHADSvasc 4).  INR has been therapeutic prior to admission but 1.7 today; begin IV heparin and continue until INR > 2.  Plan cardioversion today. May need antiarrhythmic in the future.  However options will be somewhat limited.  He is bradycardic and amiodarone will likely exacerbate this. Tikosyn likely not a good choice given renal insufficiency. Not a candidate for flecainide given coronary artery disease.  3 bradycardia-patient remains somewhat bradycardic.  Continue to hold metoprolol.  4 chest pain/elevated troponin-symptoms are extremely atypical and troponin minimally elevated but no clear trend.  We will not pursue further ischemia evaluation.  5 coronary artery disease-no aspirin given need for anticoagulation.  Would resume statin at discharge.  6 acute kidney injury-creatinine elevated compared to baseline; will hold on further diuresis today.  For questions or updates, please contact [place] Please consult [email or web address] for contact info under  Signed, Kirk Ruths, MD  02/21/2019, 10:17 AM

## 2019-02-21 NOTE — Transfer of Care (Signed)
Immediate Anesthesia Transfer of Care Note  Patient: Bobby Lindsey  Procedure(s) Performed: CARDIOVERSION (N/A )  Patient Location: Endoscopy Unit  Anesthesia Type:General  Level of Consciousness: awake, alert  and oriented  Airway & Oxygen Therapy: Patient Spontanous Breathing and Patient connected to nasal cannula oxygen  Post-op Assessment: Report given to RN, Post -op Vital signs reviewed and stable and Patient moving all extremities X 4  Post vital signs: Reviewed and stable  Last Vitals:  Vitals Value Taken Time  BP    Temp    Pulse    Resp    SpO2      Last Pain:  Vitals:   02/21/19 1106  TempSrc: Temporal  PainSc: 0-No pain         Complications: No apparent anesthesia complications

## 2019-02-21 NOTE — Anesthesia Postprocedure Evaluation (Signed)
Anesthesia Post Note  Patient: Bobby Lindsey  Procedure(s) Performed: CARDIOVERSION (N/A )     Patient location during evaluation: Endoscopy Anesthesia Type: General Level of consciousness: awake and alert Pain management: pain level controlled Vital Signs Assessment: post-procedure vital signs reviewed and stable Respiratory status: spontaneous breathing, nonlabored ventilation and respiratory function stable Cardiovascular status: blood pressure returned to baseline and stable Postop Assessment: no apparent nausea or vomiting Anesthetic complications: no    Last Vitals:  Vitals:   02/21/19 1132 02/21/19 1146  BP: (!) 150/77 125/65  Pulse:  69  Resp: 18 (!) 22  Temp:    SpO2: 96% 92%    Last Pain:  Vitals:   02/21/19 1146  TempSrc:   PainSc: 0-No pain                 Catalina Gravel

## 2019-02-21 NOTE — H&P (View-Only) (Signed)
[facility] PROGRESS NOTE    Progress Note  Bobby Lindsey  [id number] DOB: [birth year] DOA: 02/19/2019 PCP: Adin Hector, MD     Brief Narrative:   Bobby Lindsey is an 64 y.o. male past medical history of hypertension diabetes mellitus type 2, CAD status post CABG 2002, STEMI 2015, paroxysmal atrial fibrillation status post cardioversion on Coumadin, with a history of recurrent DVT and PE moderate pulmonary hypertension noncompliant with his obstructive sleep apnea and left BKA presents with lightheadedness chest pain and shortness of breath he relates his shortness of breath has been worsening for the last several days he cannot even call walk to the bathroom.  Assessment/Plan:   Lightheadedness likely due to atrial flutter with variable block and ventricular bradycardia: We will continue IV diuresis, there is not much difference in weight from admission to the previous weight documented.  Continue to hold metoprolol Might need to go home off metoprolol. Cardiology has been consulted as is I think the cause of his lightheadedness is the a flutter with slow rate and variable block. 2D echo done on 02/19/2018 showed preserved EF unable to evaluate diastolic function. Cardiology is to cardiovert 02/21/2019 is 1.7 pharmacy to dose. Continue IV Lasix per cards, good urine output K of 3.8 creatinine has remained stable.    Acute on chronic diastolic heart failure: Likely exacerbated by atrial arrhythmias, started on IV Lasix with good diuresis. 2D echo as above, monitor electrolytes and creatinine. Potassium and creatinine have remained stable.  Chest pain: Twelve-lead EKG showed atrial flutter with slow ventricular rate. Mild elevation in troponins they have remained flat likely demand ischemia in the setting of flutter. He denies any chest pain.  Uncontrolled type 2 diabetes mellitus with insulin therapy ([place]) With an A1c of 9.6, currently on long-acting insulin plus  sliding scale blood glucose fairly controlled continue current regimen.  CAD status post CABG and stenting 2015: Continue Lipitor, Imdur  GERD: Increase PPI to twice a day.  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   DVT prophylaxis: coumadin Family Communication:none Disposition Plan/Barrier to D/C: unable to determine Code Status:     Code Status Orders  (From admission, onward)         Start     Ordered   02/20/19 0251  Full code  Continuous     02/20/19 0252        Code Status History    Date Active Date Inactive Code Status Order ID Comments User Context   06/17/2018 1953 06/20/2018 1935 Full Code [id number]  Marty Heck, DO Inpatient   11/23/2016 2308 11/26/2016 2240 Full Code [id number]  Phillips Grout, MD Inpatient   04/05/2013 1255 04/11/2013 1730 Full Code [id number]  Martinique, Peter M, MD Inpatient   Advance Care Planning Activity        IV Access:    Peripheral IV   Procedures and diagnostic studies:   DG Chest 2 View  Result Date: 02/19/2019 CLINICAL DATA:  64 year old male with chest pain. EXAM: CHEST - 2 VIEW COMPARISON:  Chest radiograph dated 06/17/2018 FINDINGS: There is mild cardiomegaly with possible mild vascular congestion. No focal consolidation, pleural effusion or pneumothorax. Blunting of the left costophrenic angle similar to prior radiograph and CT, likely related to scarring. Median sternotomy wires. No acute osseous pathology. IMPRESSION: Cardiomegaly with possible mild vascular congestion. No focal consolidation. Electronically Signed   By: Anner Crete M.D.   On: 02/19/2019 21:36  ECHOCARDIOGRAM COMPLETE  Result Date: 02/20/2019   ECHOCARDIOGRAM REPORT   Patient Name:   Bobby Lindsey Date of Exam: 02/20/2019 Medical Rec #:  [id number]       Height:       72.0 in Accession #:    [id number]      Weight:       264.2 lb Date of Birth:  [birth year]      BSA:          2.40 m  Patient Age:    59 years        BP:           143/91 mmHg Patient Gender: M               HR:           50 bpm. Exam Location:  Inpatient Procedure: 2D Echo Indications:    Atrial Flutter 427.32 / I48.92  History:        Patient has prior history of Echocardiogram examinations, most                 recent 07/03/2018. CHF, Arrythmias:Bradycardia,                 Signs/Symptoms:Chest Pain; Risk Factors:Obesity, Hypertension,                 Dyslipidemia and Diabetes. CKD                 History of pulmonary embolism.  Sonographer:    Vikki Ports Turrentine Referring Phys: [id number] [place]  Sonographer Comments: Image acquisition challenging due to uncooperative patient. Unable to complete exam. Patient asked to end exam. IMPRESSIONS  1. Left ventricular ejection fraction, by visual estimation, is 55 to 60%. The left ventricle has normal function. There is no left ventricular hypertrophy.  2. Left ventricular diastolic function could not be evaluated.  3. Mildly dilated left ventricular internal cavity size.  4. The left ventricle has no regional wall motion abnormalities.  5. Global right ventricle has normal systolic function.The right ventricular size is normal. No increase in right ventricular wall thickness.  6. Left atrial size was mildly dilated.  7. Right atrial size was moderately dilated.  8. The pericardium was not well visualized.  9. Mild mitral annular calcification. 10. The mitral valve is normal in structure. Trivial mitral valve regurgitation. 11. The tricuspid valve is normal in structure. 12. The aortic valve has an indeterminant number of cusps. Aortic valve regurgitation is not visualized. No evidence of aortic valve sclerosis or stenosis. 13. The pulmonic valve was grossly normal. Pulmonic valve regurgitation is not visualized. 14. Severely elevated pulmonary artery systolic pressure. 15. Patient appears to be in rate controlled atrial flutter throughout study. Study ended early due to patient  request. 16. The interatrial septum was not assessed. FINDINGS  Left Ventricle: Left ventricular ejection fraction, by visual estimation, is 55 to 60%. The left ventricle has normal function. The left ventricle has no regional wall motion abnormalities. The left ventricular internal cavity size was mildly dilated left ventricle. There is no left ventricular hypertrophy. Left ventricular diastolic function could not be evaluated. Right Ventricle: The right ventricular size is normal. No increase in right ventricular wall thickness. Global RV systolic function is has normal systolic function. The tricuspid regurgitant velocity is 3.89 m/s, and with an assumed right atrial pressure  of 8 mmHg, the estimated right ventricular systolic pressure is severely elevated at 68.5 mmHg. Left Atrium: Left atrial  size was mildly dilated. Right Atrium: Right atrial size was moderately dilated Pericardium: The pericardium was not well visualized. Mitral Valve: The mitral valve is normal in structure. Mild mitral annular calcification. Trivial mitral valve regurgitation. Tricuspid Valve: The tricuspid valve is normal in structure. Tricuspid valve regurgitation is mild. Aortic Valve: The aortic valve has an indeterminant number of cusps. Aortic valve regurgitation is not visualized. The aortic valve is structurally normal, with no evidence of sclerosis or stenosis. Pulmonic Valve: The pulmonic valve was grossly normal. Pulmonic valve regurgitation is not visualized. Pulmonic regurgitation is not visualized. Aorta: The aortic root and ascending aorta are structurally normal, with no evidence of dilitation. Pulmonary Artery: The pulmonary artery is not well seen. Venous: The inferior vena cava was not well visualized. IAS/Shunts: The interatrial septum was not assessed.  LEFT VENTRICLE PLAX 2D LVIDd:         5.63 cm  Diastology LVIDs:         3.93 cm  LV e' lateral:   11.80 cm/s LV PW:         1.09 cm  LV E/e' lateral: 9.7 LV IVS:         1.06 cm LVOT diam:     2.10 cm LV SV:         88 ml LV SV Index:   35.24 LVOT Area:     3.46 cm  LEFT ATRIUM           Index       RIGHT ATRIUM           Index LA diam:      5.70 cm 2.38 cm/m  RA Area:     28.30 cm LA Vol (A4C): 77.8 ml 32.44 ml/m RA Volume:   102.00 ml 42.53 ml/m  AORTIC VALVE LVOT Vmax:   99.90 cm/s LVOT Vmean:  68.100 cm/s LVOT VTI:    0.203 m  AORTA Ao Root diam: 2.90 cm MITRAL VALVE                         TRICUSPID VALVE MV Area (PHT): 3.27 cm              TR Peak grad:   60.5 mmHg MV PHT:        67.28 msec            TR Vmax:        389.00 cm/s MV Decel Time: 232 msec MV E velocity: 114.00 cm/s 103 cm/s  SHUNTS MV A velocity: 77.60 cm/s  70.3 cm/s Systemic VTI:  0.20 m MV E/A ratio:  1.47        1.5       Systemic Diam: 2.10 cm  Buford Dresser MD Electronically signed by Buford Dresser MD Signature Date/Time: 02/20/2019/7:30:17 PM    Final    VAS US CAROTID  Result Date: 02/20/2019 Carotid Arterial Duplex Study Indications:       Dizziness. Risk Factors:      Hypertension, Diabetes, coronary artery disease. Limitations        Today's exam was limited due to the patient's inability or                    unwillingness to cooperate. Comparison Study:  No prior study. Performing Technologist: Baldwin Crown ARDMS, RVT  Examination Guidelines: A complete evaluation includes B-mode imaging, spectral Doppler, color Doppler, and power Doppler as needed of all accessible portions of each vessel.  Bilateral testing is considered an integral part of a complete examination. Limited examinations for reoccurring indications may be performed as noted.  Right Carotid Findings: +----------+--------+--------+--------+------------------+------------------+           PSV cm/sEDV cm/sStenosisPlaque DescriptionComments           +----------+--------+--------+--------+------------------+------------------+ CCA Prox  101     10                                                    +----------+--------+--------+--------+------------------+------------------+ CCA Distal93      14                                intimal thickening +----------+--------+--------+--------+------------------+------------------+ ICA Prox  66      10      1-39%   focal and calcific                   +----------+--------+--------+--------+------------------+------------------+ ICA Distal79      13                                                   +----------+--------+--------+--------+------------------+------------------+ ECA       145     14              focal and calcific                   +----------+--------+--------+--------+------------------+------------------+ +----------+--------+-------+----------------+-------------------+           PSV cm/sEDV cmsDescribe        Arm Pressure (mmHG) +----------+--------+-------+----------------+-------------------+ [id number]     22     Multiphasic, WNL                    +----------+--------+-------+----------------+-------------------+ +---------+--------+--+--------+-+---------+ VertebralPSV cm/s41EDV cm/s8Antegrade +---------+--------+--+--------+-+---------+  Left Carotid Findings: +----------+--------+--------+--------+------------------+--------+           PSV cm/sEDV cm/sStenosisPlaque DescriptionComments +----------+--------+--------+--------+------------------+--------+ CCA Prox  137     19                                         +----------+--------+--------+--------+------------------+--------+ CCA Distal92      11              calcific                   +----------+--------+--------+--------+------------------+--------+ ICA Prox  96      15      1-39%                              +----------+--------+--------+--------+------------------+--------+ ICA Distal80      11                                         +----------+--------+--------+--------+------------------+--------+ ECA        141     16                                         +----------+--------+--------+--------+------------------+--------+ +----------+--------+--------+------------+-------------------+  PSV cm/sEDV cm/sDescribe    Arm Pressure (mmHG) +----------+--------+--------+------------+-------------------+ Subclavian                Not assessed                    +----------+--------+--------+------------+-------------------+ +---------+--------+--------+------------+ VertebralPSV cm/sEDV cm/sNot assessed +---------+--------+--------+------------+ Left vertebral and subclavian arteries not assessed due to patient unwilling to hold still and demanded to stop exam. Summary: Right Carotid: Velocities in the right ICA are consistent with a 1-39% stenosis. Left Carotid: Velocities in the left ICA are consistent with a 1-39% stenosis. Vertebrals:  Right vertebral artery demonstrates antegrade flow. Subclavians: Normal flow hemodynamics were seen in the right subclavian artery. *See table(s) above for measurements and observations.  Electronically signed by Monica Martinez MD on 02/20/2019 at 5:08:24 PM.    Final      Medical Consultants:    None.  Anti-Infectives:   None  Subjective:    [name] he still feels tired and fatigue, complaining of some nausea denies any chest pain or shortness of breath he does relate he feels better than when he came on on admission  Objective:    Vitals:   02/20/19 2016 02/20/19 2102 02/21/19 0523 02/21/19 0524  BP: (!) 143/91 138/65  (!) 173/87  Pulse:  (!) 53  (!) 47  Resp:  18    Temp:  97.6 F (36.4 C)  (!) 97.4 F (36.3 C)  TempSrc:  Oral  Oral  SpO2:  95%  93%  Weight: 117.7 kg  118 kg   Height: 6' (1.829 m)      SpO2: 93 % O2 Flow Rate (L/min): 2 L/min   Intake/Output Summary (Last 24 hours) at 02/21/2019 0753 Last data filed at 02/21/2019 0711 Gross per 24 hour  Intake 480 ml  Output 2125 ml  Net -1645 ml    Filed Weights   02/20/19 2016 02/21/19 0523  Weight: 117.7 kg 118 kg    Exam: General exam: In no acute distress. Respiratory system: Good air movement and clear to auscultation. Cardiovascular system: S1 & S2 heard, RRR. No JVD. Gastrointestinal system: Abdomen is nondistended, soft and nontender.  Central nervous system: Alert and oriented. No focal neurological deficits. Extremities: No pedal edema. Skin: No rashes, lesions or ulcers Psychiatry: Judgement and insight appear normal. Mood & affect appropriate.    Data Reviewed:    Labs: Basic Metabolic Panel: Recent Labs  Lab 02/19/19 1031 02/19/19 2056 02/20/19 0448 02/21/19 0421  NA 138 139 141 139  K 4.1 4.2 4.2 3.8  CL 101 103 103 104  CO2 29 25 25 27   GLUCOSE 181* 136* 125* 164*  BUN 38* 34* 36* 34*  CREATININE 1.29* 1.46* 1.36* 1.30*  CALCIUM 9.2 9.3 9.4 9.1   GFR Estimated Creatinine Clearance: 76.2 mL/min (A) (by C-G formula based on SCr of 1.3 mg/dL (H)). Liver Function Tests: Recent Labs  Lab 02/20/19 0448 02/21/19 0421  AST 28 19  ALT 33 27  ALKPHOS 57 53  BILITOT 1.0 1.6*  PROT 7.3 7.0  ALBUMIN 3.6 3.5   No results for input(s): LIPASE, AMYLASE in the last 168 hours. No results for input(s): AMMONIA in the last 168 hours. Coagulation profile Recent Labs  Lab 02/20/19 0115 02/21/19 0421  INR 2.2* 1.7*   COVID-19 Labs  No results for input(s): DDIMER, FERRITIN, LDH, CRP in the last 72 hours.  Lab Results  Component Value Date   SARSCOV2NAA NEGATIVE 02/20/2019   SARSCOV2NAA NEGATIVE  06/17/2018    CBC: Recent Labs  Lab 02/19/19 2056 02/20/19 0448 02/21/19 0421  WBC 8.5 8.9 7.2  HGB 15.6 15.3 14.9  HCT 48.7 47.7 45.8  MCV 88.7 89.2 87.9  PLT 185 184 170   Cardiac Enzymes: No results for input(s): CKTOTAL, CKMB, CKMBINDEX, TROPONINI in the last 168 hours. BNP (last 3 results) No results for input(s): PROBNP in the last 8760 hours. CBG: Recent Labs  Lab 02/19/19 2330  02/20/19 0812 02/20/19 1729 02/20/19 2345 02/21/19 0622  GLUCAP 122* 179* 198* 181* 154*   D-Dimer: No results for input(s): DDIMER in the last 72 hours. Hgb A1c: Recent Labs    02/20/19 0848  HGBA1C 9.6*   Lipid Profile: No results for input(s): CHOL, HDL, LDLCALC, TRIG, CHOLHDL, LDLDIRECT in the last 72 hours. Thyroid function studies: No results for input(s): TSH, T4TOTAL, T3FREE, THYROIDAB in the last 72 hours.  Invalid input(s): FREET3 Anemia work up: No results for input(s): VITAMINB12, FOLATE, FERRITIN, TIBC, IRON, RETICCTPCT in the last 72 hours. Sepsis Labs: Recent Labs  Lab 02/19/19 2056 02/20/19 0448 02/21/19 0421  WBC 8.5 8.9 7.2   Microbiology Recent Results (from the past 240 hour(s))  SARS CORONAVIRUS 2 (TAT 6-24 HRS) Nasopharyngeal Nasopharyngeal Swab     Status: None   Collection Time: 02/20/19  1:28 AM   Specimen: Nasopharyngeal Swab  Result Value Ref Range Status   SARS Coronavirus 2 NEGATIVE NEGATIVE Final    Comment: (NOTE) SARS-CoV-2 target nucleic acids are NOT DETECTED. The SARS-CoV-2 RNA is generally detectable in upper and lower respiratory specimens during the acute phase of infection. Negative results do not preclude SARS-CoV-2 infection, do not rule out co-infections with other pathogens, and should not be used as the sole basis for treatment or other patient management decisions. Negative results must be combined with clinical observations, patient history, and epidemiological information. The expected result is Negative. Fact Sheet for Patients: SugarRoll.be Fact Sheet for Healthcare Providers: https://www.woods-mathews.com/ This test is not yet approved or cleared by the [place] FDA and  has been authorized for detection and/or diagnosis of SARS-CoV-2 by FDA under an Emergency Use Authorization (EUA). This EUA will remain  in effect (meaning this test can be used) for the duration of  the COVID-19 declaration under Section 56 4(b)(1) of the Act, 21 U.S.C. section 360bbb-3(b)(1), unless the authorization is terminated or revoked sooner. Performed at [facility], [place] [address] [place], [place] [address]      Medications:   . atorvastatin  20 mg Oral Daily  . furosemide  80 mg Intravenous BID  . insulin aspart  0-15 Units Subcutaneous TID WC  . insulin aspart  0-5 Units Subcutaneous QHS  . insulin aspart  4 Units Subcutaneous TID WC  . insulin glargine  25 Units Subcutaneous BID  . isosorbide mononitrate  30 mg Oral Daily  . losartan  25 mg Oral Daily  . pantoprazole  40 mg Oral QPM  . potassium chloride  10 mEq Oral q morning - 10a  . sodium chloride flush  3 mL Intravenous Once  . sodium chloride flush  3 mL Intravenous Q12H  . warfarin  12.5 mg Oral ONCE-1800  . warfarin  9 mg Oral ONCE-1800  . Warfarin - Pharmacist Dosing Inpatient   Does not apply q1800   Continuous Infusions: . sodium chloride        LOS: 1 day   Charlynne Cousins  [facility]  02/21/2019, 7:53 AM

## 2019-02-21 NOTE — Progress Notes (Addendum)
[place] for Coumadin Indication: atrial fibrillation, pulmonary embolus and DVT (history)  Allergies  Allergen Reactions  . Simvastatin Diarrhea and Other (See Comments)    Fatigue, diarrhea      Patient Measurements: Height: 6' (182.9 cm) Weight: 260 lb 3.2 oz (118 kg)(Scale A) IBW/kg (Calculated) : 77.6  Heparin dosing wt: 103kg   Vital Signs: Temp: 97.4 F (36.3 C) (01/08 0524) Temp Source: Oral (01/08 0524) BP: 173/87 (01/08 0524) Pulse Rate: 47 (01/08 0524)  Labs: Recent Labs    02/19/19 2056 02/19/19 2330 02/20/19 0115 02/20/19 0448 02/20/19 1142 02/21/19 0421  HGB 15.6  --   --  15.3  --  14.9  HCT 48.7  --   --  47.7  --  45.8  PLT 185  --   --  184  --  170  LABPROT  --   --  24.4*  --   --  20.3*  INR  --   --  2.2*  --   --  1.7*  CREATININE 1.46*  --   --  1.36*  --  1.30*  TROPONINIHS 173* 196*  --   --  99*  --     Estimated Creatinine Clearance: 76.2 mL/min (A) (by C-G formula based on SCr of 1.3 mg/dL (H)).   Medical History: Past Medical History:  Diagnosis Date  . Anginal pain ([place])    last pm  . Atrial fibrillation ([place])    a. Transient during 03/2013 admission.  . Bradycardia    a. Bradycardia/pauses/possible Mobitz II during 03/2013 adm. Not on BB due to this.  Marland Kitchen CAD (coronary artery disease)    a. s/p CABG 2002. b. Hx Cypher stent to the RCA. c. Inf-lat STEMI 03/2013:  LHC (04/05/13):  mLAD occluded, pD1 90, apical br of Dx occluded, CFX occluded, pOM1 90-95, RCA stents patent, diff RCA 30, S-Dx occluded, S-PDA occluded, S-OM1 40-50, L-LAD patent, EF 40% with inf HK.  PCI:  Promus (2.5 x 28) DES to mid to dist CFX.  Marland Kitchen Charcot's joint of knee   . COPD (chronic obstructive pulmonary disease) ([place])   . Deep venous thrombosis ([facility])    right lower extremity  . Diabetes mellitus    a. A1C 10.7 in 03/2013.  . Diastolic CHF ([place])    a. EF 40% by cath, 55-60% during 03/2013 adm, required IV diuresis.  .  DVT, lower extremity, recurrent ([place])    a. Hx recurrent DVT per record.  . Dyslipidemia   . Elevated CK    a. Pt has refused rheum workup in the past.  . GERD (gastroesophageal reflux disease)   . History of hiatal hernia   . HTN (hypertension)    x 15 years  . Hx of cardiovascular stress test    a. Lexiscan Myoview (03/2010):  diaph atten vs inf scar, no ischemia, EF 47%; Low Risk.  Marland Kitchen Hx of echocardiogram    a. Echo (04/08/13):  Mild LVH, EF 55-60%, restrictive physiology, severe LAE, mild reduced RVSF, mild RAE.  . Leg pain    ABI 6/16:  R 1.2, L 1.1 - normal  . Myocardial infarction ([place])   . Obesity   . Peripheral neuropathy   . Pulmonary embolism ([place])   . Sleep apnea     Medications:  Coumadin PTA 9mg  daily except 5mg  Mon and Fri (per clinic records)  Assessment: 64 yo M presented with lightheadedness, chest pain, and SOB.  Patient on warfarin PTA  for afib, hx DVT/PE.  Plans noted for possible DCCV -INR= 1.7 -coumadin not given 02/20/19  PTA: 9mg /day except 5mg  MF  Goal of Therapy:  INR 2-3 Monitor platelets by anticoagulation protocol: Yes   Plan:  -Coumadin 12.5mg  po today -Daily PT/INR  Hildred Laser, PharmD Clinical Pharmacist **Pharmacist phone directory can now be found on [email or web address] ([email or web address]).  Listed under [place].  Addendum -adding heparin with plans for cardioversion today  Plan -Since plans for DCCV today will give a heparin bolus of 2000 units x1 (reduced dose due to INR 1.7) -start heparin at 1400 units/hr -Heparin level in 6 hours and daily wth CBC daily  Hildred Laser, PharmD Clinical Pharmacist **Pharmacist phone directory can now be found on [email or web address] ([email or web address]).  Listed under [place].

## 2019-02-21 NOTE — Anesthesia Preprocedure Evaluation (Addendum)
Anesthesia Evaluation  Patient identified by MRN, date of birth, ID band Patient awake    Reviewed: Allergy & Precautions, NPO status , Patient's Chart, lab work & pertinent test results  Airway Mallampati: III  TM Distance: >3 FB Neck ROM: Full    Dental  (+) Teeth Intact, Dental Advisory Given   Pulmonary sleep apnea , COPD,    Pulmonary exam normal breath sounds clear to auscultation       Cardiovascular hypertension, Pt. on medications and Pt. on home beta blockers + angina + CAD, + Past MI, + Cardiac Stents, + CABG, + Peripheral Vascular Disease, +CHF and + DVT  + dysrhythmias Atrial Fibrillation  Rhythm:Irregular Rate:Abnormal     Neuro/Psych PSYCHIATRIC DISORDERS Depression  Neuromuscular disease    GI/Hepatic Neg liver ROS, hiatal hernia, GERD  Medicated,  Endo/Other  diabetes, Type 2, Insulin Dependent, Oral Hypoglycemic AgentsObesity   Renal/GU Renal InsufficiencyRenal disease     Musculoskeletal  (+) Arthritis ,   Abdominal   Peds  Hematology  (+) Blood dyscrasia (Warfarin), ,   Anesthesia Other Findings Day of surgery medications reviewed with the patient.  Reproductive/Obstetrics                            Anesthesia Physical Anesthesia Plan  ASA: III  Anesthesia Plan: General   Post-op Pain Management:    Induction: Intravenous  PONV Risk Score and Plan: 2 and Treatment may vary due to age or medical condition  Airway Management Planned: Mask  Additional Equipment:   Intra-op Plan:   Post-operative Plan:   Informed Consent: I have reviewed the patients History and Physical, chart, labs and discussed the procedure including the risks, benefits and alternatives for the proposed anesthesia with the patient or authorized representative who has indicated his/her understanding and acceptance.     Dental advisory given  Plan Discussed with: CRNA  Anesthesia Plan  Comments:         Anesthesia Quick Evaluation

## 2019-02-21 NOTE — Anesthesia Procedure Notes (Signed)
Procedure Name: MAC Date/Time: 02/21/2019 11:25 AM Performed by: Neldon Newport, CRNA Pre-anesthesia Checklist: Patient identified, Emergency Drugs available, Suction available, Patient being monitored and Timeout performed Patient Re-evaluated:Patient Re-evaluated prior to induction Oxygen Delivery Method: Simple face mask

## 2019-02-21 NOTE — Progress Notes (Signed)
[place] for Coumadin Indication: atrial fibrillation, pulmonary embolus and DVT (history)  Allergies  Allergen Reactions  . Simvastatin Diarrhea and Other (See Comments)    Fatigue, diarrhea      Patient Measurements: Height: 6' (182.9 cm) Weight: 260 lb 3.2 oz (118 kg)(Scale A) IBW/kg (Calculated) : 77.6  Heparin dosing wt: 103kg   Vital Signs: Temp: 98.4 F (36.9 C) (01/08 0755) Temp Source: Oral (01/08 0755) BP: 156/66 (01/08 0821) Pulse Rate: 42 (01/08 0821)  Labs: Recent Labs    02/19/19 2056 02/19/19 2330 02/20/19 0115 02/20/19 0448 02/20/19 1142 02/21/19 0421  HGB 15.6  --   --  15.3  --  14.9  HCT 48.7  --   --  47.7  --  45.8  PLT 185  --   --  184  --  170  LABPROT  --   --  24.4*  --   --  20.3*  INR  --   --  2.2*  --   --  1.7*  CREATININE 1.46*  --   --  1.36*  --  1.30*  TROPONINIHS 173* 196*  --   --  99*  --     Estimated Creatinine Clearance: 76.2 mL/min (A) (by C-G formula based on SCr of 1.3 mg/dL (H)).   Medical History: Past Medical History:  Diagnosis Date  . Anginal pain ([place])    last pm  . Atrial fibrillation ([place])    a. Transient during 03/2013 admission.  . Bradycardia    a. Bradycardia/pauses/possible Mobitz II during 03/2013 adm. Not on BB due to this.  Marland Kitchen CAD (coronary artery disease)    a. s/p CABG 2002. b. Hx Cypher stent to the RCA. c. Inf-lat STEMI 03/2013:  LHC (04/05/13):  mLAD occluded, pD1 90, apical br of Dx occluded, CFX occluded, pOM1 90-95, RCA stents patent, diff RCA 30, S-Dx occluded, S-PDA occluded, S-OM1 40-50, L-LAD patent, EF 40% with inf HK.  PCI:  Promus (2.5 x 28) DES to mid to dist CFX.  Marland Kitchen Charcot's joint of knee   . COPD (chronic obstructive pulmonary disease) ([place])   . Deep venous thrombosis (HCC)    right lower extremity  . Diabetes mellitus    a. A1C 10.7 in 03/2013.  . Diastolic CHF ([place])    a. EF 40% by cath, 55-60% during 03/2013 adm, required IV diuresis.  .  DVT, lower extremity, recurrent ([place])    a. Hx recurrent DVT per record.  . Dyslipidemia   . Elevated CK    a. Pt has refused rheum workup in the past.  . GERD (gastroesophageal reflux disease)   . History of hiatal hernia   . HTN (hypertension)    x 15 years  . Hx of cardiovascular stress test    a. Lexiscan Myoview (03/2010):  diaph atten vs inf scar, no ischemia, EF 47%; Low Risk.  Marland Kitchen Hx of echocardiogram    a. Echo (04/08/13):  Mild LVH, EF 55-60%, restrictive physiology, severe LAE, mild reduced RVSF, mild RAE.  . Leg pain    ABI 6/16:  R 1.2, L 1.1 - normal  . Myocardial infarction ([place])   . Obesity   . Peripheral neuropathy   . Pulmonary embolism ([place])   . Sleep apnea     Medications:  Coumadin PTA 9mg  daily except 5mg  Mon and Fri (per clinic records)  Assessment: 64 yo M presented with lightheadedness, chest pain, and SOB.  Patient on warfarin PTA  for afib, hx DVT/PE.  Plans noted for possible DCCV -INR= 1.7 -coumadin not given 02/20/19  PTA: 9mg /day except 5mg  MF  Goal of Therapy:  INR 2-3 Monitor platelets by anticoagulation protocol: Yes   Plan:  -Coumadin 12.5mg  po today -Daily PT/INR  Hildred Laser, PharmD Clinical Pharmacist **Pharmacist phone directory can now be found on [email or web address] ([email or web address]).  Listed under [place].  Addendum -adding heparin with plans for cardioversion this morning  Plan -Since plans for DCCV this morning will give a heparin bolus of 4000 units x1 -start heparin at 1400 units/hr -Heparin level in 6 hours and daily wth CBC daily  Hildred Laser, PharmD Clinical Pharmacist **Pharmacist phone directory can now be found on [email or web address] ([email or web address]).  Listed under [place].

## 2019-02-21 NOTE — Plan of Care (Signed)

## 2019-02-21 NOTE — Progress Notes (Signed)
[facility] for Coumadin, Heparin Indication: atrial fibrillation, pulmonary embolus and DVT (history)  Allergies  Allergen Reactions  . Simvastatin Diarrhea and Other (See Comments)    Fatigue, diarrhea      Patient Measurements: Height: 6' (182.9 cm) Weight: 260 lb 3.2 oz (118 kg)(Scale A) IBW/kg (Calculated) : 77.6  Heparin dosing wt: 103kg   Vital Signs: Temp: 97.9 F (36.6 C) (01/08 1611) Temp Source: Oral (01/08 1611) BP: 128/74 (01/08 1611) Pulse Rate: 64 (01/08 1611)  Labs: Recent Labs    02/19/19 2056 02/19/19 2330 02/20/19 0115 02/20/19 0448 02/20/19 1142 02/21/19 0421 02/21/19 1652  HGB 15.6  --   --  15.3  --  14.9  --   HCT 48.7  --   --  47.7  --  45.8  --   PLT 185  --   --  184  --  170  --   LABPROT  --   --  24.4*  --   --  20.3*  --   INR  --   --  2.2*  --   --  1.7*  --   HEPARINUNFRC  --   --   --   --   --   --  0.35  CREATININE 1.46*  --   --  1.36*  --  1.30*  --   TROPONINIHS 173* 196*  --   --  99*  --   --     Estimated Creatinine Clearance: 76.2 mL/min (A) (by C-G formula based on SCr of 1.3 mg/dL (H)).   Medical History: Past Medical History:  Diagnosis Date  . Anginal pain ([place])    last pm  . Atrial fibrillation ([place])    a. Transient during 03/2013 admission.  . Bradycardia    a. Bradycardia/pauses/possible Mobitz II during 03/2013 adm. Not on BB due to this.  Marland Kitchen CAD (coronary artery disease)    a. s/p CABG 2002. b. Hx Cypher stent to the RCA. c. Inf-lat STEMI 03/2013:  LHC (04/05/13):  mLAD occluded, pD1 90, apical br of Dx occluded, CFX occluded, pOM1 90-95, RCA stents patent, diff RCA 30, S-Dx occluded, S-PDA occluded, S-OM1 40-50, L-LAD patent, EF 40% with inf HK.  PCI:  Promus (2.5 x 28) DES to mid to dist CFX.  Marland Kitchen Charcot's joint of knee   . COPD (chronic obstructive pulmonary disease) ([place])   . Deep venous thrombosis (H[facility])    right lower extremity  . Diabetes mellitus    a. A1C 10.7 in  03/2013.  . Diastolic CHF ([place])    a. EF 40% by cath, 55-60% during 03/2013 adm, required IV diuresis.  . DVT, lower extremity, recurrent ([place])    a. Hx recurrent DVT per record.  . Dyslipidemia   . Elevated CK    a. Pt has refused rheum workup in the past.  . GERD (gastroesophageal reflux disease)   . History of hiatal hernia   . HTN (hypertension)    x 15 years  . Hx of cardiovascular stress test    a. Lexiscan Myoview (03/2010):  diaph atten vs inf scar, no ischemia, EF 47%; Low Risk.  Marland Kitchen Hx of echocardiogram    a. Echo (04/08/13):  Mild LVH, EF 55-60%, restrictive physiology, severe LAE, mild reduced RVSF, mild RAE.  . Leg pain    ABI 6/16:  R 1.2, L 1.1 - normal  . Myocardial infarction ([place])   . Obesity   . Peripheral neuropathy   .  Pulmonary embolism ([place])   . Sleep apnea     Medications:  Coumadin PTA 9mg  daily except 5mg  Mon and Fri (per clinic records)  Assessment: 64 yo M presented with lightheadedness, chest pain, and SOB.  Patient on warfarin PTA for afib, hx DVT/PE.  Plans noted for possible DCCV -INR= 1.7 -coumadin not given 02/20/19  PTA: 9mg /day except 5mg  MF  PM Follow up - heparin level now at low end of therapeutic range.    Goal of Therapy:  Heparin level 0.3-0.7 INR 2-3 Monitor platelets by anticoagulation protocol: Yes   Plan:  1. Increase IV heparin to 1500 units/hr. 2. Recheck heparin level in 6 hrs. 3. Daily heparin level and CBC.  Marguerite Olea, [facility] Clinical Pharmacist Phone [phone]  02/21/2019 6:07 PM

## 2019-02-21 NOTE — CV Procedure (Signed)
CARDIOVERSION  Patient sedated by anesthesia with propofol and lidocaine  With pads in AP position, patient cardioverted to SR from atrial flutter  Procedure without complication  12. Lead EKG pending    Dorris Carnes MD

## 2019-02-21 NOTE — Progress Notes (Signed)
[facility] PROGRESS NOTE    Progress Note  Bobby Lindsey  [id number] DOB: Feb 17, 1955 DOA: 02/19/2019 PCP: Adin Hector, MD     Brief Narrative:   Bobby Lindsey is an 64 y.o. male past medical history of hypertension diabetes mellitus type 2, CAD status post CABG 2002, STEMI 2015, paroxysmal atrial fibrillation status post cardioversion on Coumadin, with a history of recurrent DVT and PE moderate pulmonary hypertension noncompliant with his obstructive sleep apnea and left BKA presents with lightheadedness chest pain and shortness of breath he relates his shortness of breath has been worsening for the last several days he cannot even call walk to the bathroom.  Assessment/Plan:   Lightheadedness likely due to atrial flutter with variable block and ventricular bradycardia: We will continue IV diuresis, there is not much difference in weight from admission to the previous weight documented.  Continue to hold metoprolol Might need to go home off metoprolol. Cardiology has been consulted as is I think the cause of his lightheadedness is the a flutter with slow rate and variable block. 2D echo done on 02/19/2018 showed preserved EF unable to evaluate diastolic function. Cardiology is to cardiovert 02/21/2019 is 1.7 pharmacy to dose. Continue IV Lasix per cards, good urine output K of 3.8 creatinine has remained stable.    Acute on chronic diastolic heart failure: Likely exacerbated by atrial arrhythmias, started on IV Lasix with good diuresis. 2D echo as above, monitor electrolytes and creatinine. Potassium and creatinine have remained stable.  Chest pain: Twelve-lead EKG showed atrial flutter with slow ventricular rate. Mild elevation in troponins they have remained flat likely demand ischemia in the setting of flutter. He denies any chest pain.  Uncontrolled type 2 diabetes mellitus with insulin therapy ([place]) With an A1c of 9.6, currently on long-acting insulin plus  sliding scale blood glucose fairly controlled continue current regimen.  CAD status post CABG and stenting 2015: Continue Lipitor, Imdur  GERD: Increase PPI to twice a day.  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   DVT prophylaxis: coumadin Family Communication:none Disposition Plan/Barrier to D/C: unable to determine Code Status:     Code Status Orders  (From admission, onward)         Start     Ordered   02/20/19 0251  Full code  Continuous     02/20/19 0252        Code Status History    Date Active Date Inactive Code Status Order ID Comments User Context   06/17/2018 1953 06/20/2018 1935 Full Code [id number]  Marty Heck, DO Inpatient   11/23/2016 2308 11/26/2016 2240 Full Code [id number]  Phillips Grout, MD Inpatient   04/05/2013 1255 04/11/2013 1730 Full Code [id number]  Martinique, Peter M, MD Inpatient   Advance Care Planning Activity        IV Access:    Peripheral IV   Procedures and diagnostic studies:   DG Chest 2 View  Result Date: 02/19/2019 CLINICAL DATA:  64 year old male with chest pain. EXAM: CHEST - 2 VIEW COMPARISON:  Chest radiograph dated 06/17/2018 FINDINGS: There is mild cardiomegaly with possible mild vascular congestion. No focal consolidation, pleural effusion or pneumothorax. Blunting of the left costophrenic angle similar to prior radiograph and CT, likely related to scarring. Median sternotomy wires. No acute osseous pathology. IMPRESSION: Cardiomegaly with possible mild vascular congestion. No focal consolidation. Electronically Signed   By: Anner Crete M.D.   On: 02/19/2019 21:36  ECHOCARDIOGRAM COMPLETE  Result Date: 02/20/2019   ECHOCARDIOGRAM REPORT   Patient Name:   Bobby Lindsey Date of Exam: 02/20/2019 Medical Rec #:  [id number]       Height:       72.0 in Accession #:    [id number]      Weight:       264.2 lb Date of Birth:  Jan 30, 1956      BSA:          2.40 m  Patient Age:    69 years        BP:           143/91 mmHg Patient Gender: M               HR:           50 bpm. Exam Location:  Inpatient Procedure: 2D Echo Indications:    Atrial Flutter 427.32 / I48.92  History:        Patient has prior history of Echocardiogram examinations, most                 recent 07/03/2018. CHF, Arrythmias:Bradycardia,                 Signs/Symptoms:Chest Pain; Risk Factors:Obesity, Hypertension,                 Dyslipidemia and Diabetes. CKD                 History of pulmonary embolism.  Sonographer:    Vikki Ports Turrentine Referring Phys: [id number] [place]  Sonographer Comments: Image acquisition challenging due to uncooperative patient. Unable to complete exam. Patient asked to end exam. IMPRESSIONS  1. Left ventricular ejection fraction, by visual estimation, is 55 to 60%. The left ventricle has normal function. There is no left ventricular hypertrophy.  2. Left ventricular diastolic function could not be evaluated.  3. Mildly dilated left ventricular internal cavity size.  4. The left ventricle has no regional wall motion abnormalities.  5. Global right ventricle has normal systolic function.The right ventricular size is normal. No increase in right ventricular wall thickness.  6. Left atrial size was mildly dilated.  7. Right atrial size was moderately dilated.  8. The pericardium was not well visualized.  9. Mild mitral annular calcification. 10. The mitral valve is normal in structure. Trivial mitral valve regurgitation. 11. The tricuspid valve is normal in structure. 12. The aortic valve has an indeterminant number of cusps. Aortic valve regurgitation is not visualized. No evidence of aortic valve sclerosis or stenosis. 13. The pulmonic valve was grossly normal. Pulmonic valve regurgitation is not visualized. 14. Severely elevated pulmonary artery systolic pressure. 15. Patient appears to be in rate controlled atrial flutter throughout study. Study ended early due to patient  request. 16. The interatrial septum was not assessed. FINDINGS  Left Ventricle: Left ventricular ejection fraction, by visual estimation, is 55 to 60%. The left ventricle has normal function. The left ventricle has no regional wall motion abnormalities. The left ventricular internal cavity size was mildly dilated left ventricle. There is no left ventricular hypertrophy. Left ventricular diastolic function could not be evaluated. Right Ventricle: The right ventricular size is normal. No increase in right ventricular wall thickness. Global RV systolic function is has normal systolic function. The tricuspid regurgitant velocity is 3.89 m/s, and with an assumed right atrial pressure  of 8 mmHg, the estimated right ventricular systolic pressure is severely elevated at 68.5 mmHg. Left Atrium: Left atrial  size was mildly dilated. Right Atrium: Right atrial size was moderately dilated Pericardium: The pericardium was not well visualized. Mitral Valve: The mitral valve is normal in structure. Mild mitral annular calcification. Trivial mitral valve regurgitation. Tricuspid Valve: The tricuspid valve is normal in structure. Tricuspid valve regurgitation is mild. Aortic Valve: The aortic valve has an indeterminant number of cusps. Aortic valve regurgitation is not visualized. The aortic valve is structurally normal, with no evidence of sclerosis or stenosis. Pulmonic Valve: The pulmonic valve was grossly normal. Pulmonic valve regurgitation is not visualized. Pulmonic regurgitation is not visualized. Aorta: The aortic root and ascending aorta are structurally normal, with no evidence of dilitation. Pulmonary Artery: The pulmonary artery is not well seen. Venous: The inferior vena cava was not well visualized. IAS/Shunts: The interatrial septum was not assessed.  LEFT VENTRICLE PLAX 2D LVIDd:         5.63 cm  Diastology LVIDs:         3.93 cm  LV e' lateral:   11.80 cm/s LV PW:         1.09 cm  LV E/e' lateral: 9.7 LV IVS:         1.06 cm LVOT diam:     2.10 cm LV SV:         88 ml LV SV Index:   35.24 LVOT Area:     3.46 cm  LEFT ATRIUM           Index       RIGHT ATRIUM           Index LA diam:      5.70 cm 2.38 cm/m  RA Area:     28.30 cm LA Vol (A4C): 77.8 ml 32.44 ml/m RA Volume:   102.00 ml 42.53 ml/m  AORTIC VALVE LVOT Vmax:   99.90 cm/s LVOT Vmean:  68.100 cm/s LVOT VTI:    0.203 m  AORTA Ao Root diam: 2.90 cm MITRAL VALVE                         TRICUSPID VALVE MV Area (PHT): 3.27 cm              TR Peak grad:   60.5 mmHg MV PHT:        67.28 msec            TR Vmax:        389.00 cm/s MV Decel Time: 232 msec MV E velocity: 114.00 cm/s 103 cm/s  SHUNTS MV A velocity: 77.60 cm/s  70.3 cm/s Systemic VTI:  0.20 m MV E/A ratio:  1.47        1.5       Systemic Diam: 2.10 cm  Buford Dresser MD Electronically signed by Buford Dresser MD Signature Date/Time: 02/20/2019/7:30:17 PM    Final    VAS US CAROTID  Result Date: 02/20/2019 Carotid Arterial Duplex Study Indications:       Dizziness. Risk Factors:      Hypertension, Diabetes, coronary artery disease. Limitations        Today's exam was limited due to the patient's inability or                    unwillingness to cooperate. Comparison Study:  No prior study. Performing Technologist: Baldwin Crown ARDMS, RVT  Examination Guidelines: A complete evaluation includes B-mode imaging, spectral Doppler, color Doppler, and power Doppler as needed of all accessible portions of each vessel.  Bilateral testing is considered an integral part of a complete examination. Limited examinations for reoccurring indications may be performed as noted.  Right Carotid Findings: +----------+--------+--------+--------+------------------+------------------+           PSV cm/sEDV cm/sStenosisPlaque DescriptionComments           +----------+--------+--------+--------+------------------+------------------+ CCA Prox  101     10                                                    +----------+--------+--------+--------+------------------+------------------+ CCA Distal93      14                                intimal thickening +----------+--------+--------+--------+------------------+------------------+ ICA Prox  66      10      1-39%   focal and calcific                   +----------+--------+--------+--------+------------------+------------------+ ICA Distal79      13                                                   +----------+--------+--------+--------+------------------+------------------+ ECA       145     14              focal and calcific                   +----------+--------+--------+--------+------------------+------------------+ +----------+--------+-------+----------------+-------------------+           PSV cm/sEDV cmsDescribe        Arm Pressure (mmHG) +----------+--------+-------+----------------+-------------------+ [id number]     22     Multiphasic, WNL                    +----------+--------+-------+----------------+-------------------+ +---------+--------+--+--------+-+---------+ VertebralPSV cm/s41EDV cm/s8Antegrade +---------+--------+--+--------+-+---------+  Left Carotid Findings: +----------+--------+--------+--------+------------------+--------+           PSV cm/sEDV cm/sStenosisPlaque DescriptionComments +----------+--------+--------+--------+------------------+--------+ CCA Prox  137     19                                         +----------+--------+--------+--------+------------------+--------+ CCA Distal92      11              calcific                   +----------+--------+--------+--------+------------------+--------+ ICA Prox  96      15      1-39%                              +----------+--------+--------+--------+------------------+--------+ ICA Distal80      11                                         +----------+--------+--------+--------+------------------+--------+ ECA        141     16                                         +----------+--------+--------+--------+------------------+--------+ +----------+--------+--------+------------+-------------------+  PSV cm/sEDV cm/sDescribe    Arm Pressure (mmHG) +----------+--------+--------+------------+-------------------+ Subclavian                Not assessed                    +----------+--------+--------+------------+-------------------+ +---------+--------+--------+------------+ VertebralPSV cm/sEDV cm/sNot assessed +---------+--------+--------+------------+ Left vertebral and subclavian arteries not assessed due to patient unwilling to hold still and demanded to stop exam. Summary: Right Carotid: Velocities in the right ICA are consistent with a 1-39% stenosis. Left Carotid: Velocities in the left ICA are consistent with a 1-39% stenosis. Vertebrals:  Right vertebral artery demonstrates antegrade flow. Subclavians: Normal flow hemodynamics were seen in the right subclavian artery. *See table(s) above for measurements and observations.  Electronically signed by Monica Martinez MD on 02/20/2019 at 5:08:24 PM.    Final      Medical Consultants:    None.  Anti-Infectives:   None  Subjective:    Bobby Lindsey he still feels tired and fatigue, complaining of some nausea denies any chest pain or shortness of breath he does relate he feels better than when he came on on admission  Objective:    Vitals:   02/20/19 2016 02/20/19 2102 02/21/19 0523 02/21/19 0524  BP: (!) 143/91 138/65  (!) 173/87  Pulse:  (!) 53  (!) 47  Resp:  18    Temp:  97.6 F (36.4 C)  (!) 97.4 F (36.3 C)  TempSrc:  Oral  Oral  SpO2:  95%  93%  Weight: 117.7 kg  118 kg   Height: 6' (1.829 m)      SpO2: 93 % O2 Flow Rate (L/min): 2 L/min   Intake/Output Summary (Last 24 hours) at 02/21/2019 0753 Last data filed at 02/21/2019 0711 Gross per 24 hour  Intake 480 ml  Output 2125 ml  Net -1645 ml    Filed Weights   02/20/19 2016 02/21/19 0523  Weight: 117.7 kg 118 kg    Exam: General exam: In no acute distress. Respiratory system: Good air movement and clear to auscultation. Cardiovascular system: S1 & S2 heard, RRR. No JVD. Gastrointestinal system: Abdomen is nondistended, soft and nontender.  Central nervous system: Alert and oriented. No focal neurological deficits. Extremities: No pedal edema. Skin: No rashes, lesions or ulcers Psychiatry: Judgement and insight appear normal. Mood & affect appropriate.    Data Reviewed:    Labs: Basic Metabolic Panel: Recent Labs  Lab 02/19/19 1031 02/19/19 2056 02/20/19 0448 02/21/19 0421  NA 138 139 141 139  K 4.1 4.2 4.2 3.8  CL 101 103 103 104  CO2 29 25 25 27   GLUCOSE 181* 136* 125* 164*  BUN 38* 34* 36* 34*  CREATININE 1.29* 1.46* 1.36* 1.30*  CALCIUM 9.2 9.3 9.4 9.1   GFR Estimated Creatinine Clearance: 76.2 mL/min (A) (by C-G formula based on SCr of 1.3 mg/dL (H)). Liver Function Tests: Recent Labs  Lab 02/20/19 0448 02/21/19 0421  AST 28 19  ALT 33 27  ALKPHOS 57 53  BILITOT 1.0 1.6*  PROT 7.3 7.0  ALBUMIN 3.6 3.5   No results for input(s): LIPASE, AMYLASE in the last 168 hours. No results for input(s): AMMONIA in the last 168 hours. Coagulation profile Recent Labs  Lab 02/20/19 0115 02/21/19 0421  INR 2.2* 1.7*   COVID-19 Labs  No results for input(s): DDIMER, FERRITIN, LDH, CRP in the last 72 hours.  Lab Results  Component Value Date   SARSCOV2NAA NEGATIVE 02/20/2019   SARSCOV2NAA NEGATIVE  06/17/2018    CBC: Recent Labs  Lab 02/19/19 2056 02/20/19 0448 02/21/19 0421  WBC 8.5 8.9 7.2  HGB 15.6 15.3 14.9  HCT 48.7 47.7 45.8  MCV 88.7 89.2 87.9  PLT 185 184 170   Cardiac Enzymes: No results for input(s): CKTOTAL, CKMB, CKMBINDEX, TROPONINI in the last 168 hours. BNP (last 3 results) No results for input(s): PROBNP in the last 8760 hours. CBG: Recent Labs  Lab 02/19/19 2330  02/20/19 0812 02/20/19 1729 02/20/19 2345 02/21/19 0622  GLUCAP 122* 179* 198* 181* 154*   D-Dimer: No results for input(s): DDIMER in the last 72 hours. Hgb A1c: Recent Labs    02/20/19 0848  HGBA1C 9.6*   Lipid Profile: No results for input(s): CHOL, HDL, LDLCALC, TRIG, CHOLHDL, LDLDIRECT in the last 72 hours. Thyroid function studies: No results for input(s): TSH, T4TOTAL, T3FREE, THYROIDAB in the last 72 hours.  Invalid input(s): FREET3 Anemia work up: No results for input(s): VITAMINB12, FOLATE, FERRITIN, TIBC, IRON, RETICCTPCT in the last 72 hours. Sepsis Labs: Recent Labs  Lab 02/19/19 2056 02/20/19 0448 02/21/19 0421  WBC 8.5 8.9 7.2   Microbiology Recent Results (from the past 240 hour(s))  SARS CORONAVIRUS 2 (TAT 6-24 HRS) Nasopharyngeal Nasopharyngeal Swab     Status: None   Collection Time: 02/20/19  1:28 AM   Specimen: Nasopharyngeal Swab  Result Value Ref Range Status   SARS Coronavirus 2 NEGATIVE NEGATIVE Final    Comment: (NOTE) SARS-CoV-2 target nucleic acids are NOT DETECTED. The SARS-CoV-2 RNA is generally detectable in upper and lower respiratory specimens during the acute phase of infection. Negative results do not preclude SARS-CoV-2 infection, do not rule out co-infections with other pathogens, and should not be used as the sole basis for treatment or other patient management decisions. Negative results must be combined with clinical observations, patient history, and epidemiological information. The expected result is Negative. Fact Sheet for Patients: SugarRoll.be Fact Sheet for Healthcare Providers: https://www.woods-mathews.com/ This test is not yet approved or cleared by the [place] FDA and  has been authorized for detection and/or diagnosis of SARS-CoV-2 by FDA under an Emergency Use Authorization (EUA). This EUA will remain  in effect (meaning this test can be used) for the duration of  the COVID-19 declaration under Section 56 4(b)(1) of the Act, 21 U.S.C. section 360bbb-3(b)(1), unless the authorization is terminated or revoked sooner. Performed at [facility], [place] [address], [place], [place] [address]      Medications:   . atorvastatin  20 mg Oral Daily  . furosemide  80 mg Intravenous BID  . insulin aspart  0-15 Units Subcutaneous TID WC  . insulin aspart  0-5 Units Subcutaneous QHS  . insulin aspart  4 Units Subcutaneous TID WC  . insulin glargine  25 Units Subcutaneous BID  . isosorbide mononitrate  30 mg Oral Daily  . losartan  25 mg Oral Daily  . pantoprazole  40 mg Oral QPM  . potassium chloride  10 mEq Oral q morning - 10a  . sodium chloride flush  3 mL Intravenous Once  . sodium chloride flush  3 mL Intravenous Q12H  . warfarin  12.5 mg Oral ONCE-1800  . warfarin  9 mg Oral ONCE-1800  . Warfarin - Pharmacist Dosing Inpatient   Does not apply q1800   Continuous Infusions: . sodium chloride        LOS: 1 day   Charlynne Cousins  [facility]  02/21/2019, 7:53 AM

## 2019-02-21 NOTE — Interval H&P Note (Signed)
History and Physical Interval Note:  02/21/2019 9:42 AM  Bobby Lindsey  has presented today for surgery, with the diagnosis of atrial flutter.  The various methods of treatment have been discussed with the patient and family. After consideration of risks, benefits and other options for treatment, the patient has consented to  Procedure(s): CARDIOVERSION (N/A) as a surgical intervention.  The patient's history has been reviewed, patient examined, no change in status, stable for surgery.  I have reviewed the patient's chart and labs.  Questions were answered to the patient's satisfaction.     Dorris Carnes

## 2019-02-22 ENCOUNTER — Other Ambulatory Visit: Payer: Self-pay | Admitting: Physician Assistant

## 2019-02-22 DIAGNOSIS — I5033 Acute on chronic diastolic (congestive) heart failure: Secondary | ICD-10-CM

## 2019-02-22 LAB — CBC
HCT: 43.9 % (ref 39.0–52.0)
Hemoglobin: 14.3 g/dL (ref 13.0–17.0)
MCH: 28.2 pg (ref 26.0–34.0)
MCHC: 32.6 g/dL (ref 30.0–36.0)
MCV: 86.6 fL (ref 80.0–100.0)
Platelets: 159 10*3/uL (ref 150–400)
RBC: 5.07 MIL/uL (ref 4.22–5.81)
RDW: 15.1 % (ref 11.5–15.5)
WBC: 7.8 10*3/uL (ref 4.0–10.5)
nRBC: 0 % (ref 0.0–0.2)

## 2019-02-22 LAB — GLUCOSE, CAPILLARY
Glucose-Capillary: 96 mg/dL (ref 70–99)
Glucose-Capillary: 100 mg/dL — ABNORMAL HIGH (ref 70–99)

## 2019-02-22 LAB — PROTIME-INR
INR: 1.4 — ABNORMAL HIGH (ref 0.8–1.2)
Prothrombin Time: 17.4 seconds — ABNORMAL HIGH (ref 11.4–15.2)

## 2019-02-22 LAB — HEPARIN LEVEL (UNFRACTIONATED)
Heparin Unfractionated: 0.32 IU/mL (ref 0.30–0.70)
Heparin Unfractionated: 0.32 IU/mL (ref 0.30–0.70)
Heparin Unfractionated: 0.44 IU/mL (ref 0.30–0.70)

## 2019-02-22 MED ORDER — ENOXAPARIN SODIUM 120 MG/0.8ML ~~LOC~~ SOLN: 120.0000 mg | Freq: Two times a day (BID) | SUBCUTANEOUS | 0 refills | Status: DC

## 2019-02-22 MED ORDER — TORSEMIDE 20 MG PO TABS
40.0000 mg | ORAL_TABLET | Freq: Two times a day (BID) | ORAL | Status: DC
  Administered 2019-02-22 (×2): 40 mg via ORAL
  Filled 2019-02-22 (×2): qty 2

## 2019-02-22 MED ORDER — ENOXAPARIN SODIUM 120 MG/0.8ML ~~LOC~~ SOLN
120.0000 mg | Freq: Two times a day (BID) | SUBCUTANEOUS | Status: DC
  Administered 2019-02-22: 120 mg via SUBCUTANEOUS
  Filled 2019-02-22: qty 0.8

## 2019-02-22 MED ORDER — WARFARIN SODIUM 10 MG PO TABS: 10.0000 mg | ORAL_TABLET | Freq: Once | ORAL | 0 refills | Status: DC

## 2019-02-22 MED ORDER — WARFARIN SODIUM 10 MG PO TABS
10.0000 mg | ORAL_TABLET | Freq: Once | ORAL | Status: AC
  Administered 2019-02-22: 10 mg via ORAL
  Filled 2019-02-22: qty 1

## 2019-02-22 NOTE — Care Management (Signed)
Lovenox $100 copay and will be filled at [facility]. Patient aware and is OK w copay . Notified MD to send any other rxs to [facility].

## 2019-02-22 NOTE — Discharge Summary (Signed)
Bobby Lindsey, is a 64 y.o. male  DOB 31-Jul-1955  MRN [id number].  Admission date:  02/19/2019  Admitting Physician  Charlynne Cousins, MD  Discharge Date:  02/22/2019   Primary MD  Adin Hector, MD  Recommendations for primary care physician for things to follow:  INR, BMP on Monday 02/24/2019   Admission Diagnosis  NSTEMI (non-ST elevated myocardial infarction) ([place]) [I21.4] Acute CHF (congestive heart failure) ([place]) [I50.9] Acute on chronic diastolic congestive heart failure (HCC) [I50.33] Bradycardia [R00.1]   Discharge Diagnosis  NSTEMI (non-ST elevated myocardial infarction) ([place]) [I21.4] Acute CHF (congestive heart failure) ([place]) [I50.9] Acute on chronic diastolic congestive heart failure (HCC) [I50.33] Bradycardia [R00.1]    Principal Problem:   Acute CHF (congestive heart failure) (HCC) Active Problems:   Bradycardia   Uncontrolled type 2 diabetes mellitus with insulin therapy ([place])   Atypical atrial flutter (HCC)   Chest pain   Lightheadedness      Past Medical History:  Diagnosis Date  . Anginal pain ([place])    last pm  . Atrial fibrillation ([place])    a. Transient during 03/2013 admission.  . Bradycardia    a. Bradycardia/pauses/possible Mobitz II during 03/2013 adm. Not on BB due to this.  Marland Kitchen CAD (coronary artery disease)    a. s/p CABG 2002. b. Hx Cypher stent to the RCA. c. Inf-lat STEMI 03/2013:  LHC (04/05/13):  mLAD occluded, pD1 90, apical br of Dx occluded, CFX occluded, pOM1 90-95, RCA stents patent, diff RCA 30, S-Dx occluded, S-PDA occluded, S-OM1 40-50, L-LAD patent, EF 40% with inf HK.  PCI:  Promus (2.5 x 28) DES to mid to dist CFX.  Marland Kitchen Charcot's joint of knee   . COPD (chronic obstructive pulmonary disease) ([place])   . Deep venous thrombosis (HCC)    right lower extremity  . Diabetes mellitus    a. A1C 10.7 in 03/2013.  . Diastolic CHF ([place])    a. EF 40% by cath,  55-60% during 03/2013 adm, required IV diuresis.  . DVT, lower extremity, recurrent ([place])    a. Hx recurrent DVT per record.  . Dyslipidemia   . Elevated CK    a. Pt has refused rheum workup in the past.  . GERD (gastroesophageal reflux disease)   . History of hiatal hernia   . HTN (hypertension)    x 15 years  . Hx of cardiovascular stress test    a. Lexiscan Myoview (03/2010):  diaph atten vs inf scar, no ischemia, EF 47%; Low Risk.  Marland Kitchen Hx of echocardiogram    a. Echo (04/08/13):  Mild LVH, EF 55-60%, restrictive physiology, severe LAE, mild reduced RVSF, mild RAE.  . Leg pain    ABI 6/16:  R 1.2, L 1.1 - normal  . Myocardial infarction ([place])   . Obesity   . Peripheral neuropathy   . Pulmonary embolism ([place])   . Sleep apnea     Past Surgical History:  Procedure Laterality Date  . CARDIOVERSION N/A 06/19/2018   Procedure:  CARDIOVERSION;  Surgeon: Acie Fredrickson Wonda Cheng, MD;  Location: [facility];  Service: Cardiovascular;  Laterality: N/A;  . CORONARY ANGIOPLASTY WITH STENT PLACEMENT    . CORONARY ARTERY BYPASS GRAFT     4 time since 2002  . LEFT HEART CATHETERIZATION WITH CORONARY/GRAFT ANGIOGRAM  04/05/2013   Procedure: LEFT HEART CATHETERIZATION WITH Beatrix Fetters;  Surgeon: Peter M Martinique, MD;  Location[facility] CATH LAB;  Service: Cardiovascular;;  . left knee surgery    . LOWER EXTREMITY ANGIOGRAPHY Left 12/25/2016   Procedure: LOWER EXTREMITY ANGIOGRAPHY;  Surgeon: Algernon Huxley, MD;  Location: [facility] CV LAB;  Service: Cardiovascular;  Laterality: Left;  . PERCUTANEOUS CORONARY STENT INTERVENTION (PCI-S)  04/05/2013   Procedure: PERCUTANEOUS CORONARY STENT INTERVENTION (PCI-S);  Surgeon: Peter M Martinique, MD;  Location: [facility] CATH LAB;  Service: Cardiovascular;;  DES to native Mid cx  . VASCULAR SURGERY         HPI  from the history and physical done on the day of admission:    Bobby Lindsey  is a 64 y.o. male,  w hypertension, hyperlipidemia, Dm2, peripheral  neuropathy, CAD s/p CABG 2002, STEMI 2015,  Pafib/ Flutter w h/o cardioversion, Bradycardia, CHD (diastolic), h/o recurrent DVT, PE, COPD, moderate pulmonary hypertension,  OSA noncompliant with cpap, L BKA apparently presents with c/o lightheadedness, chest pain, and sob.   Pt notes sob has been worse for several days.  The chest pain occurred while he was in the ED walking to the bathroom.  Pt points to the right side/ right mid area of his chest and states lasted for seconds.  Pt notes slight lightheadedness as well.  No recent change in medications per patient.  Pt notes he has lost weight.  No complaints of excessive leg swelling. Pt denies fever, chills, cough, alteration in sense of taste or smell , palp, orthopnea, n/v, abd pain, diarrhea, brbpr.   In ED,  T 98.3, P 43, R 18, Bp 131/73  Pox 95% on RA  CXR IMPRESSION: Cardiomegaly with possible mild vascular congestion. No focal consolidation.   Na 139, K 4.2, Bun 34, creatinine 1.46 Wbc 8.5, Hgb 15.6, Plt 185 BNP 259.4 INR 2.2  Trop 173-> 196  Ekg -> aflutter at 43, nl axis, no st-t changes c/w ischemia   Pt will be admitted for dyspnea secondary to mild acute on chronic CHF/ bradycardia, chest pain with mild elevation in troponin, and also dizziness secondary to bradycardia.     Review of systems:    In addition to the HPI above,  No Fever-chills, No Headache, No changes with Vision or hearing, No problems swallowing food or Liquids, No  Cough   No Abdominal pain, No Nausea or Vomiting, bowel movements are regular, No Blood in stool or Urine, No dysuria, No new skin rashes or bruises, No new joints pains-aches,  No new weakness, tingling, numbness in any extremity, No recent weight gain or loss, No polyuria, polydypsia or polyphagia, No significant Mental Stressors.  All other systems reviewed and are negative.        Hospital Course:   Bobby Lindsey is an 64 y.o. male past medical history of  hypertension diabetes mellitus type 2, CAD status post CABG 2002, STEMI 2015, paroxysmal atrial fibrillation status post cardioversion on Coumadin, with a history of recurrent DVT and PE moderate pulmonary hypertension noncompliant with his obstructive sleep apnea and left BKA presents with lightheadedness chest pain and shortness of breath he relates his shortness of breath has been  worsening for the last several days he cannot even call walk to the bathroom.     Acute on chronic diastolic heart failure: Likely exacerbated by atrial arrhythmias, started on IV Lasix with good diuresis. 2D echo 02/20/2019- Preserved EF. Responded to diuresis with monitoring of renal function, electrolytes.No more dizziness  Atrial Flutter He remains in sinus rhythm status post cardioversion.  However his INR is subtherapeutic at 1.4.  Discharged on coumadin with lovenox bridge. He is off of metoprolol given intermittent bradycardia.  May need antiarrhythmic in the future. However options will be somewhat limited. He is bradycardic and amiodarone will likely exacerbate this. Cardiology feelsTikosynlikely not a good choice given renal insufficiency. Not a candidate for flecainide given coronary artery disease.  Bradycardia He had  intermittent Mobitz 1 and 2-1 AV block but no prolonged pauses.  No history of syncope.  Continued off of metoprolol for now May need pacemaker in the future, per cardiology    Chest pain: Twelve-lead EKG showed atrial flutter with slow ventricular rate. Mild elevation in troponins they have remained flat likely demand ischemia in the setting of flutter. He denies any chest pain.  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   Specialty: Cardiology Why: Keep Coumadin clinic appointment on Monday 02/24/19 at 11:45am. Please make sure they also draw labwork (BMET) at that visit - we sent our office a message to remind them. Contact information: [address], [place] [phone]       Minus Breeding, MD Follow up.   Specialty: Cardiology Why: Please keep appointment with Dr. Percival Spanish as scheduled 03/10/19 at 10am. Contact information: [address] [place] [address] [phone]            Consults obtained - Cardiology, Dr Stanford Breed  Diet and Activity recommendation:  As advised  Discharge Instructions     Discharge Instructions    ([place]) Call MD:  Anytime you have any of the following symptoms: 1) 3 pound weight gain in 24 hours or 5 pounds in 1 week 2) shortness of breath, with or without a dry hacking cough 3) swelling in the hands, feet or stomach 4) if you have to sleep on extra pillows at night in order to breathe.   Complete by: As directed    Call MD for:  difficulty breathing, headache or visual disturbances   Complete by: As directed    Call MD for:  extreme fatigue   Complete by: As directed    Call MD for:  hives   Complete by: As directed    Call MD for:  persistant dizziness or light-headedness   Complete by: As directed    Call MD for:  persistant nausea and vomiting   Complete by: As directed    Call MD for:  redness, tenderness, or signs of infection (pain, swelling, redness, odor or green/yellow discharge around incision site)   Complete by: As directed    Call MD for:  severe uncontrolled pain   Complete by: As directed    Call MD for:  temperature >100.4  Complete by: As directed    Diet - low sodium heart healthy   Complete by:  As directed    Increase activity slowly   Complete by: As directed         Discharge Medications     Allergies as of 02/22/2019      Reactions   Simvastatin Diarrhea, Other (See Comments)   Fatigue, diarrhea      Medication List    STOP taking these medications   metoprolol succinate 25 MG 24 hr tablet Commonly known as: TOPROL-XL     TAKE these medications   atorvastatin 20 MG tablet Commonly known as: LIPITOR Take 1 tablet (20 mg total) by mouth daily.   enoxaparin 120 MG/0.8ML injection Commonly known as: LOVENOX Inject 0.8 mLs (120 mg total) into the skin every 12 (twelve) hours.   Fifty50 Glucose Meter 2.0 w/Device Kit Use as directed.   fluticasone 50 MCG/ACT nasal spray Commonly known as: FLONASE Place 2 sprays into both nostrils daily as needed for allergies or rhinitis.   gabapentin 300 MG capsule Commonly known as: NEURONTIN Take 600 mg by mouth at bedtime as needed (pain in foot and ankle).   HumaLOG KwikPen 100 UNIT/ML injection Generic drug: insulin lispro Inject 36 Units into the skin 3 (three) times daily before meals.   Insulin Syringe-Needle U-100 31G X 5/16" 0.3 ML Misc use as directed   isosorbide mononitrate 30 MG 24 hr tablet Commonly known as: IMDUR Take 1 tablet (30 mg total) by mouth daily.   losartan 25 MG tablet Commonly known as: COZAAR Take 1 tablet (25 mg total) by mouth daily.   metFORMIN 500 MG 24 hr tablet Commonly known as: GLUCOPHAGE-XR Take 500 mg by mouth daily with breakfast.   nitroGLYCERIN 0.4 MG SL tablet Commonly known as: NITROSTAT Place 1 tablet (0.4 mg total) under the tongue every 5 (five) minutes as needed for chest pain. NEEDS APPOINTMENT   pantoprazole 40 MG tablet Commonly known as: PROTONIX Take 1 tablet (40 mg total) by mouth every evening.   potassium chloride 10 MEQ tablet Commonly known as: KLOR-CON Take 10 mEq by mouth every morning.   torsemide 20 MG tablet Commonly known as:  DEMADEX Take 2 tablets (40 mg total) by mouth 2 (two) times daily.   Toujeo SoloStar 300 UNIT/ML Sopn Generic drug: Insulin Glargine (1 Unit Dial) Inject 70 Units into the skin every morning.   TYLENOL 500 MG tablet Generic drug: acetaminophen Take 1,000 mg every 8 (eight) hours as needed by mouth for mild pain or headache.   warfarin 10 MG tablet Commonly known as: COUMADIN Take as directed. If you are unsure how to take this medication, talk to your nurse or doctor. Original instructions: Take 1 tablet (10 mg total) by mouth one time only at 6 PM. What changed:   medication strength  how much to take  when to take this  additional instructions       Major procedures and Radiology Reports - PLEASE review detailed and final reports for all details, in brief -      DG Chest 2 View  Result Date: 02/19/2019 CLINICAL DATA:  64 year old male with chest pain. EXAM: CHEST - 2 VIEW COMPARISON:  Chest radiograph dated 06/17/2018 FINDINGS: There is mild cardiomegaly with possible mild vascular congestion. No focal consolidation, pleural effusion or pneumothorax. Blunting of the left costophrenic angle similar to prior radiograph and CT, likely related to scarring. Median sternotomy wires. No acute osseous pathology. IMPRESSION: Cardiomegaly  with possible mild vascular congestion. No focal consolidation. Electronically Signed   By: Anner Crete M.D.   On: 02/19/2019 21:36   ECHOCARDIOGRAM COMPLETE  Result Date: 02/20/2019   ECHOCARDIOGRAM REPORT   Patient Name:   Bobby Lindsey Date of Exam: 02/20/2019 Medical Rec #:  [id number]       Height:       72.0 in Accession #:    [id number]      Weight:       264.2 lb Date of Birth:  1955-09-12      BSA:          2.40 m Patient Age:    35 years        BP:           143/91 mmHg Patient Gender: M               HR:           50 bpm. Exam Location:  Inpatient Procedure: 2D Echo Indications:    Atrial Flutter 427.32 / I48.92  History:        Patient  has prior history of Echocardiogram examinations, most                 recent 07/03/2018. CHF, Arrythmias:Bradycardia,                 Signs/Symptoms:Chest Pain; Risk Factors:Obesity, Hypertension,                 Dyslipidemia and Diabetes. CKD                 History of pulmonary embolism.  Sonographer:    Vikki Ports Turrentine Referring Phys: [id number] [place]  Sonographer Comments: Image acquisition challenging due to uncooperative patient. Unable to complete exam. Patient asked to end exam. IMPRESSIONS  1. Left ventricular ejection fraction, by visual estimation, is 55 to 60%. The left ventricle has normal function. There is no left ventricular hypertrophy.  2. Left ventricular diastolic function could not be evaluated.  3. Mildly dilated left ventricular internal cavity size.  4. The left ventricle has no regional wall motion abnormalities.  5. Global right ventricle has normal systolic function.The right ventricular size is normal. No increase in right ventricular wall thickness.  6. Left atrial size was mildly dilated.  7. Right atrial size was moderately dilated.  8. The pericardium was not well visualized.  9. Mild mitral annular calcification. 10. The mitral valve is normal in structure. Trivial mitral valve regurgitation. 11. The tricuspid valve is normal in structure. 12. The aortic valve has an indeterminant number of cusps. Aortic valve regurgitation is not visualized. No evidence of aortic valve sclerosis or stenosis. 13. The pulmonic valve was grossly normal. Pulmonic valve regurgitation is not visualized. 14. Severely elevated pulmonary artery systolic pressure. 15. Patient appears to be in rate controlled atrial flutter throughout study. Study ended early due to patient request. 16. The interatrial septum was not assessed. FINDINGS  Left Ventricle: Left ventricular ejection fraction, by visual estimation, is 55 to 60%. The left ventricle has normal function. The left ventricle has no regional  wall motion abnormalities. The left ventricular internal cavity size was mildly dilated left ventricle. There is no left ventricular hypertrophy. Left ventricular diastolic function could not be evaluated. Right Ventricle: The right ventricular size is normal. No increase in right ventricular wall thickness. Global RV systolic function is has normal systolic function. The tricuspid regurgitant velocity is 3.89 m/s, and with an  assumed right atrial pressure  of 8 mmHg, the estimated right ventricular systolic pressure is severely elevated at 68.5 mmHg. Left Atrium: Left atrial size was mildly dilated. Right Atrium: Right atrial size was moderately dilated Pericardium: The pericardium was not well visualized. Mitral Valve: The mitral valve is normal in structure. Mild mitral annular calcification. Trivial mitral valve regurgitation. Tricuspid Valve: The tricuspid valve is normal in structure. Tricuspid valve regurgitation is mild. Aortic Valve: The aortic valve has an indeterminant number of cusps. Aortic valve regurgitation is not visualized. The aortic valve is structurally normal, with no evidence of sclerosis or stenosis. Pulmonic Valve: The pulmonic valve was grossly normal. Pulmonic valve regurgitation is not visualized. Pulmonic regurgitation is not visualized. Aorta: The aortic root and ascending aorta are structurally normal, with no evidence of dilitation. Pulmonary Artery: The pulmonary artery is not well seen. Venous: The inferior vena cava was not well visualized. IAS/Shunts: The interatrial septum was not assessed.  LEFT VENTRICLE PLAX 2D LVIDd:         5.63 cm  Diastology LVIDs:         3.93 cm  LV e' lateral:   11.80 cm/s LV PW:         1.09 cm  LV E/e' lateral: 9.7 LV IVS:        1.06 cm LVOT diam:     2.10 cm LV SV:         88 ml LV SV Index:   35.24 LVOT Area:     3.46 cm  LEFT ATRIUM           Index       RIGHT ATRIUM           Index LA diam:      5.70 cm 2.38 cm/m  RA Area:     28.30 cm LA Vol  (A4C): 77.8 ml 32.44 ml/m RA Volume:   102.00 ml 42.53 ml/m  AORTIC VALVE LVOT Vmax:   99.90 cm/s LVOT Vmean:  68.100 cm/s LVOT VTI:    0.203 m  AORTA Ao Root diam: 2.90 cm MITRAL VALVE                         TRICUSPID VALVE MV Area (PHT): 3.27 cm              TR Peak grad:   60.5 mmHg MV PHT:        67.28 msec            TR Vmax:        389.00 cm/s MV Decel Time: 232 msec MV E velocity: 114.00 cm/s 103 cm/s  SHUNTS MV A velocity: 77.60 cm/s  70.3 cm/s Systemic VTI:  0.20 m MV E/A ratio:  1.47        1.5       Systemic Diam: 2.10 cm  Buford Dresser MD Electronically signed by Buford Dresser MD Signature Date/Time: 02/20/2019/7:30:17 PM    Final    VAS US CAROTID  Result Date: 02/20/2019 Carotid Arterial Duplex Study Indications:       Dizziness. Risk Factors:      Hypertension, Diabetes, coronary artery disease. Limitations        Today's exam was limited due to the patient's inability or                    unwillingness to cooperate. Comparison Study:  No prior study. Performing Technologist: Baldwin Crown ARDMS, RVT  Examination Guidelines: A complete evaluation includes B-mode imaging, spectral Doppler, color Doppler, and power Doppler as needed of all accessible portions of each vessel. Bilateral testing is considered an integral part of a complete examination. Limited examinations for reoccurring indications may be performed as noted.  Right Carotid Findings: +----------+--------+--------+--------+------------------+------------------+           PSV cm/sEDV cm/sStenosisPlaque DescriptionComments           +----------+--------+--------+--------+------------------+------------------+ CCA Prox  101     10                                                   +----------+--------+--------+--------+------------------+------------------+ CCA Distal93      14                                intimal thickening  +----------+--------+--------+--------+------------------+------------------+ ICA Prox  66      10      1-39%   focal and calcific                   +----------+--------+--------+--------+------------------+------------------+ ICA Distal79      13                                                   +----------+--------+--------+--------+------------------+------------------+ ECA       145     14              focal and calcific                   +----------+--------+--------+--------+------------------+------------------+ +----------+--------+-------+----------------+-------------------+           PSV cm/sEDV cmsDescribe        Arm Pressure (mmHG) +----------+--------+-------+----------------+-------------------+ [id number]     22     Multiphasic, WNL                    +----------+--------+-------+----------------+-------------------+ +---------+--------+--+--------+-+---------+ VertebralPSV cm/s41EDV cm/s8Antegrade +---------+--------+--+--------+-+---------+  Left Carotid Findings: +----------+--------+--------+--------+------------------+--------+           PSV cm/sEDV cm/sStenosisPlaque DescriptionComments +----------+--------+--------+--------+------------------+--------+ CCA Prox  137     19                                         +----------+--------+--------+--------+------------------+--------+ CCA Distal92      11              calcific                   +----------+--------+--------+--------+------------------+--------+ ICA Prox  96      15      1-39%                              +----------+--------+--------+--------+------------------+--------+ ICA Distal80      11                                         +----------+--------+--------+--------+------------------+--------+ ECA  141     16                                         +----------+--------+--------+--------+------------------+--------+  +----------+--------+--------+------------+-------------------+           PSV cm/sEDV cm/sDescribe    Arm Pressure (mmHG) +----------+--------+--------+------------+-------------------+ Subclavian                Not assessed                    +----------+--------+--------+------------+-------------------+ +---------+--------+--------+------------+ VertebralPSV cm/sEDV cm/sNot assessed +---------+--------+--------+------------+ Left vertebral and subclavian arteries not assessed due to patient unwilling to hold still and demanded to stop exam. Summary: Right Carotid: Velocities in the right ICA are consistent with a 1-39% stenosis. Left Carotid: Velocities in the left ICA are consistent with a 1-39% stenosis. Vertebrals:  Right vertebral artery demonstrates antegrade flow. Subclavians: Normal flow hemodynamics were seen in the right subclavian artery. *See table(s) above for measurements and observations.  Electronically signed by Monica Martinez MD on 02/20/2019 at 5:08:24 PM.    Final     Micro Results    Recent Results (from the past 240 hour(s))  SARS CORONAVIRUS 2 (TAT 6-24 HRS) Nasopharyngeal Nasopharyngeal Swab     Status: None   Collection Time: 02/20/19  1:28 AM   Specimen: Nasopharyngeal Swab  Result Value Ref Range Status   SARS Coronavirus 2 NEGATIVE NEGATIVE Final    Comment: (NOTE) SARS-CoV-2 target nucleic acids are NOT DETECTED. The SARS-CoV-2 RNA is generally detectable in upper and lower respiratory specimens during the acute phase of infection. Negative results do not preclude SARS-CoV-2 infection, do not rule out co-infections with other pathogens, and should not be used as the sole basis for treatment or other patient management decisions. Negative results must be combined with clinical observations, patient history, and epidemiological information. The expected result is Negative. Fact Sheet for Patients: SugarRoll.be Fact  Sheet for Healthcare Providers: https://www.woods-mathews.com/ This test is not yet approved or cleared by the [place] FDA and  has been authorized for detection and/or diagnosis of SARS-CoV-2 by FDA under an Emergency Use Authorization (EUA). This EUA will remain  in effect (meaning this test can be used) for the duration of the COVID-19 declaration under Section 56 4(b)(1) of the Act, 21 U.S.C. section 360bbb-3(b)(1), unless the authorization is terminated or revoked sooner. Performed at [facility], [place] [address], [place], [place] [address]        Today   Subjective    Bobby Lindsey today has no dizziness, no chest pains.     Patient has been seen and examined prior to discharge   Objective   Blood pressure 138/60, pulse 62, temperature 97.8 F (36.6 C), temperature source Oral, resp. rate (!) 21, height 6' (1.829 m), weight 115.8 kg, SpO2 91 %.   Intake/Output Summary (Last 24 hours) at 02/22/2019 1613 Last data filed at 02/22/2019 1247 [place] per 24 hour  Intake 793 ml  Output 1375 ml  Net -582 ml    Exam Gen:- Awake. NAD HEENT:- [place].AT, MMM Neck-Supple Neck,No JVD,  Lungs- few bibasilar crackles CV- S1, S2 normal Abd-  +ve B.Sounds, Abd Soft, No tenderness,    CNS- alert, and oriented, appropriate, no focal deficit   Data Review   CBC w Diff:  Lab Results  Component Value Date   WBC 7.8 02/22/2019   HGB  14.3 02/22/2019   HGB 15.1 08/15/2016   HCT 43.9 02/22/2019   HCT 45.3 08/15/2016   PLT 159 02/22/2019   PLT 163 08/15/2016   LYMPHOPCT 28 07/09/2018   MONOPCT 9 07/09/2018   EOSPCT 2 07/09/2018   BASOPCT 1 07/09/2018    CMP:  Lab Results  Component Value Date   NA 139 02/21/2019   NA 139 10/29/2018   K 3.8 02/21/2019   CL 104 02/21/2019   CO2 27 02/21/2019   BUN 34 (H) 02/21/2019   BUN 21 10/29/2018   CREATININE 1.30 (H) 02/21/2019   PROT 7.0 02/21/2019   PROT 6.6 08/15/2016   ALBUMIN 3.5 02/21/2019   ALBUMIN  3.9 08/15/2016   BILITOT 1.6 (H) 02/21/2019   BILITOT 0.3 08/15/2016   ALKPHOS 53 02/21/2019   AST 19 02/21/2019   ALT 27 02/21/2019  .   Total Discharge time is about 33 minutes  Benito Mccreedy M.D on 02/22/2019 at 4:13 PM  [facility]   Office  [phone]  Dragon dictation system was used to create this note, attempts have been made to correct errors, however presence of uncorrected errors is not a reflection quality of care provided

## 2019-02-22 NOTE — Progress Notes (Addendum)
Dr. Stanford Breed relayed plan for possible DC on home Lovenox but need to determine from care management whether this is feasible. I placed order and spoke with nursing to call them. Also reviewed with pharmacist - we will go ahead and switch him over while inpatient from heparin to Lovenox. That way if he does go home this afternoon, he is on an easier dosing regimen of ~10a/10p rather than something like 3pm/3am.   Addendum: per d/w care management, no way to check insurance benefit on the weekend, only way to determine cost is to send into pharmacy - have done so. CM will call pharmacy shortly to f/u cost/availability.  Addendum: [facility] original [facility] so she requested re-direct refill to [facility], have done so. Left VM on [place] [facility] not to fill at their location. Pt actually already has "overdue" coumadin clinic visit serendipitously on Monday so will keep. Sent message to [place] staff to also make sure he gets BMET at that visit. Also has pre-existing f/u on 03/10/19.  Therin Vetsch PA-C

## 2019-02-22 NOTE — Discharge Instructions (Signed)

## 2019-02-22 NOTE — Progress Notes (Signed)
Progress Note  Patient Name: Bobby Lindsey Date of Encounter: 02/22/2019  Primary Cardiologist: Minus Breeding, MD   Subjective   Dyspnea resolved; no CP  Inpatient Medications    Scheduled Meds: . atorvastatin  20 mg Oral Daily  . insulin aspart  0-15 Units Subcutaneous TID WC  . insulin aspart  0-5 Units Subcutaneous QHS  . insulin aspart  4 Units Subcutaneous TID WC  . insulin glargine  25 Units Subcutaneous BID  . isosorbide mononitrate  30 mg Oral Daily  . losartan  25 mg Oral Daily  . pantoprazole  40 mg Oral BID  . potassium chloride  10 mEq Oral q morning - 10a  . sodium chloride flush  3 mL Intravenous Once  . sodium chloride flush  3 mL Intravenous Q12H  . Warfarin - Pharmacist Dosing Inpatient   Does not apply q1800   Continuous Infusions: . sodium chloride    . heparin 1,600 Units/hr (02/22/19 0408)   PRN Meds: sodium chloride, acetaminophen **OR** acetaminophen, fluticasone, gabapentin, ramelteon, sodium chloride flush   Vital Signs    Vitals:   02/21/19 1611 02/21/19 2016 02/22/19 0105 02/22/19 0605  BP: 128/74 (!) 148/75 (!) 135/96 (!) 120/58  Pulse: 64 67 64 (!) 44  Resp: 18 20 20 18   Temp: 97.9 F (36.6 C) (!) 97.5 F (36.4 C)  98 F (36.7 C)  TempSrc: Oral Oral    SpO2: 92% 91% 92% 92%  Weight:    115.8 kg  Height:        Intake/Output Summary (Last 24 hours) at 02/22/2019 0810 Last data filed at 02/22/2019 0500 Gross per 24 hour  Intake 443.42 ml  Output 800 ml  Net -356.58 ml   Last 3 Weights 02/22/2019 02/21/2019 02/20/2019  Weight (lbs) 255 lb 4.7 oz 260 lb 3.2 oz 259 lb 7.7 oz  Weight (kg) 115.8 kg 118.026 kg 117.7 kg      Telemetry    Sinus with nonconducted PACs, Mobitz 1 and 2:1 AV block- Personally Reviewed  Physical Exam   GEN: NAD Neck: No JVD; supple Cardiac: RRR Respiratory: CTA GI: Soft, NT ND MS: S/P left BKA; RLE with trace edema Neuro:  Grossly intact Psych: Normal affect   Labs    High Sensitivity Troponin:    Recent Labs  Lab 02/19/19 2056 02/19/19 2330 02/20/19 1142  TROPONINIHS 173* 196* 99*      Chemistry Recent Labs  Lab 02/19/19 2056 02/20/19 0448 02/21/19 0421  NA 139 141 139  K 4.2 4.2 3.8  CL 103 103 104  CO2 25 25 27   GLUCOSE 136* 125* 164*  BUN 34* 36* 34*  CREATININE 1.46* 1.36* 1.30*  CALCIUM 9.3 9.4 9.1  PROT  --  7.3 7.0  ALBUMIN  --  3.6 3.5  AST  --  28 19  ALT  --  33 27  ALKPHOS  --  57 53  BILITOT  --  1.0 1.6*  GFRNONAA 50* 55* 58*  GFRAA 58* >60 >60  ANIONGAP 11 13 8      Hematology Recent Labs  Lab 02/20/19 0448 02/21/19 0421 02/22/19 0112  WBC 8.9 7.2 7.8  RBC 5.35 5.21 5.07  HGB 15.3 14.9 14.3  HCT 47.7 45.8 43.9  MCV 89.2 87.9 86.6  MCH 28.6 28.6 28.2  MCHC 32.1 32.5 32.6  RDW 15.1 15.0 15.1  PLT 184 170 159    BNP Recent Labs  Lab 02/20/19 0115  BNP 259.4*  Radiology    ECHOCARDIOGRAM COMPLETE  Result Date: 02/20/2019   ECHOCARDIOGRAM REPORT   Patient Name:   Bobby Lindsey Brunner Date of Exam: 02/20/2019 Medical Rec #:  [id number]       Height:       72.0 in Accession #:    [id number]      Weight:       264.2 lb Date of Birth:  [birth year]      BSA:          2.40 m Patient Age:    64 years        BP:           143/91 mmHg Patient Gender: M               HR:           50 bpm. Exam Location:  Inpatient Procedure: 2D Echo Indications:    Atrial Flutter 427.32 / I48.92  History:        Patient has prior history of Echocardiogram examinations, most                 recent 07/03/2018. CHF, Arrythmias:Bradycardia,                 Signs/Symptoms:Chest Pain; Risk Factors:Obesity, Hypertension,                 Dyslipidemia and Diabetes. CKD                 History of pulmonary embolism.  Sonographer:    Vikki Ports Turrentine Referring Phys: [id number] [place]  Sonographer Comments: Image acquisition challenging due to uncooperative patient. Unable to complete exam. Patient asked to end exam. IMPRESSIONS  1. Left ventricular ejection fraction, by  visual estimation, is 55 to 60%. The left ventricle has normal function. There is no left ventricular hypertrophy.  2. Left ventricular diastolic function could not be evaluated.  3. Mildly dilated left ventricular internal cavity size.  4. The left ventricle has no regional wall motion abnormalities.  5. Global right ventricle has normal systolic function.The right ventricular size is normal. No increase in right ventricular wall thickness.  6. Left atrial size was mildly dilated.  7. Right atrial size was moderately dilated.  8. The pericardium was not well visualized.  9. Mild mitral annular calcification. 10. The mitral valve is normal in structure. Trivial mitral valve regurgitation. 11. The tricuspid valve is normal in structure. 12. The aortic valve has an indeterminant number of cusps. Aortic valve regurgitation is not visualized. No evidence of aortic valve sclerosis or stenosis. 13. The pulmonic valve was grossly normal. Pulmonic valve regurgitation is not visualized. 14. Severely elevated pulmonary artery systolic pressure. 15. Patient appears to be in rate controlled atrial flutter throughout study. Study ended early due to patient request. 16. The interatrial septum was not assessed. FINDINGS  Left Ventricle: Left ventricular ejection fraction, by visual estimation, is 55 to 60%. The left ventricle has normal function. The left ventricle has no regional wall motion abnormalities. The left ventricular internal cavity size was mildly dilated left ventricle. There is no left ventricular hypertrophy. Left ventricular diastolic function could not be evaluated. Right Ventricle: The right ventricular size is normal. No increase in right ventricular wall thickness. Global RV systolic function is has normal systolic function. The tricuspid regurgitant velocity is 3.89 m/s, and with an assumed right atrial pressure  of 8 mmHg, the estimated right ventricular systolic pressure is severely elevated at 68.5 mmHg.  Left Atrium: Left atrial size was mildly dilated. Right Atrium: Right atrial size was moderately dilated Pericardium: The pericardium was not well visualized. Mitral Valve: The mitral valve is normal in structure. Mild mitral annular calcification. Trivial mitral valve regurgitation. Tricuspid Valve: The tricuspid valve is normal in structure. Tricuspid valve regurgitation is mild. Aortic Valve: The aortic valve has an indeterminant number of cusps. Aortic valve regurgitation is not visualized. The aortic valve is structurally normal, with no evidence of sclerosis or stenosis. Pulmonic Valve: The pulmonic valve was grossly normal. Pulmonic valve regurgitation is not visualized. Pulmonic regurgitation is not visualized. Aorta: The aortic root and ascending aorta are structurally normal, with no evidence of dilitation. Pulmonary Artery: The pulmonary artery is not well seen. Venous: The inferior vena cava was not well visualized. IAS/Shunts: The interatrial septum was not assessed.  LEFT VENTRICLE PLAX 2D LVIDd:         5.63 cm  Diastology LVIDs:         3.93 cm  LV e' lateral:   11.80 cm/s LV PW:         1.09 cm  LV E/e' lateral: 9.7 LV IVS:        1.06 cm LVOT diam:     2.10 cm LV SV:         88 ml LV SV Index:   35.24 LVOT Area:     3.46 cm  LEFT ATRIUM           Index       RIGHT ATRIUM           Index LA diam:      5.70 cm 2.38 cm/m  RA Area:     28.30 cm LA Vol (A4C): 77.8 ml 32.44 ml/m RA Volume:   102.00 ml 42.53 ml/m  AORTIC VALVE LVOT Vmax:   99.90 cm/s LVOT Vmean:  68.100 cm/s LVOT VTI:    0.203 m  AORTA Ao Root diam: 2.90 cm MITRAL VALVE                         TRICUSPID VALVE MV Area (PHT): 3.27 cm              TR Peak grad:   60.5 mmHg MV PHT:        67.28 msec            TR Vmax:        389.00 cm/s MV Decel Time: 232 msec MV E velocity: 114.00 cm/s 103 cm/s  SHUNTS MV A velocity: 77.60 cm/s  70.3 cm/s Systemic VTI:  0.20 m MV E/A ratio:  1.47        1.5       Systemic Diam: 2.10 cm  Buford Dresser MD Electronically signed by Buford Dresser MD Signature Date/Time: 02/20/2019/7:30:17 PM    Final    VAS US CAROTID  Result Date: 02/20/2019 Carotid Arterial Duplex Study Indications:       Dizziness. Risk Factors:      Hypertension, Diabetes, coronary artery disease. Limitations        Today's exam was limited due to the patient's inability or                    unwillingness to cooperate. Comparison Study:  No prior study. Performing Technologist: Baldwin Crown ARDMS, RVT  Examination Guidelines: A complete evaluation includes B-mode imaging, spectral Doppler, color Doppler, and power Doppler as needed of all accessible  portions of each vessel. Bilateral testing is considered an integral part of a complete examination. Limited examinations for reoccurring indications may be performed as noted.  Right Carotid Findings: +----------+--------+--------+--------+------------------+------------------+           PSV cm/sEDV cm/sStenosisPlaque DescriptionComments           +----------+--------+--------+--------+------------------+------------------+ CCA Prox  101     10                                                   +----------+--------+--------+--------+------------------+------------------+ CCA Distal93      14                                intimal thickening +----------+--------+--------+--------+------------------+------------------+ ICA Prox  66      10      1-39%   focal and calcific                   +----------+--------+--------+--------+------------------+------------------+ ICA Distal79      13                                                   +----------+--------+--------+--------+------------------+------------------+ ECA       145     14              focal and calcific                   +----------+--------+--------+--------+------------------+------------------+ +----------+--------+-------+----------------+-------------------+            PSV cm/sEDV cmsDescribe        Arm Pressure (mmHG) +----------+--------+-------+----------------+-------------------+ [id number]     22     Multiphasic, WNL                    +----------+--------+-------+----------------+-------------------+ +---------+--------+--+--------+-+---------+ VertebralPSV cm/s41EDV cm/s8Antegrade +---------+--------+--+--------+-+---------+  Left Carotid Findings: +----------+--------+--------+--------+------------------+--------+           PSV cm/sEDV cm/sStenosisPlaque DescriptionComments +----------+--------+--------+--------+------------------+--------+ CCA Prox  137     19                                         +----------+--------+--------+--------+------------------+--------+ CCA Distal92      11              calcific                   +----------+--------+--------+--------+------------------+--------+ ICA Prox  96      15      1-39%                              +----------+--------+--------+--------+------------------+--------+ ICA Distal80      11                                         +----------+--------+--------+--------+------------------+--------+ ECA       141     16                                         +----------+--------+--------+--------+------------------+--------+ +----------+--------+--------+------------+-------------------+  PSV cm/sEDV cm/sDescribe    Arm Pressure (mmHG) +----------+--------+--------+------------+-------------------+ Subclavian                Not assessed                    +----------+--------+--------+------------+-------------------+ +---------+--------+--------+------------+ VertebralPSV cm/sEDV cm/sNot assessed +---------+--------+--------+------------+ Left vertebral and subclavian arteries not assessed due to patient unwilling to hold still and demanded to stop exam. Summary: Right Carotid: Velocities in the right ICA are consistent with a 1-39%  stenosis. Left Carotid: Velocities in the left ICA are consistent with a 1-39% stenosis. Vertebrals:  Right vertebral artery demonstrates antegrade flow. Subclavians: Normal flow hemodynamics were seen in the right subclavian artery. *See table(s) above for measurements and observations.  Electronically signed by Monica Martinez MD on 02/20/2019 at 5:08:24 PM.    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  Echocardiogram this admission shows ejection fraction 55 to 60%, mild left atrial enlargement, moderate right atrial enlargement, severely elevated pulmonary pressure.   Assessment & Plan    1 acute on chronic diastolic congestive heart failure-symptoms likely exacerbated by atrial arrhythmias.  He appears to be euvolemic on examination today.  Resume Demadex at home dose of 40 mg twice daily.  Resume K-Dur 10 mEq daily.  2 atrial flutter-patient remains in sinus rhythm status post cardioversion.  However his INR is subtherapeutic at 1.4.  Continue IV heparin.  I will try and arrange home Lovenox with INR check on Monday (should know by this PM; if cannot be arranged will need to keep in house for IV heparin).  He will also need to have potassium and renal function checked on Monday.  We will continue off of metoprolol given intermittent bradycardia.  May need antiarrhythmic in the future.  However options will be somewhat limited.  He is bradycardic and amiodarone will likely exacerbate this. Tikosyn likely not a good choice given renal insufficiency. Not a candidate for flecainide given coronary artery disease.  3 bradycardia-patient  with intermittent Mobitz 1 and 2-1 AV block but no prolonged pauses.  No history of syncope.  Continue off of metoprolol and follow.  May need pacemaker in the future.  4 chest pain/elevated troponin-no recurrent symptoms.  No plans for further ischemia evaluation.  5 coronary artery disease-no aspirin given need for anticoagulation.  Will resume statin at discharge.  6 acute kidney injury-Lasix held last evening.  Will resume home Demadex dose.  Check potassium and renal function on Monday.    For questions or updates, please contact [place] Please consult [email or web address] for contact info under        Signed, Kirk Ruths, MD  02/22/2019, 8:10 AM

## 2019-02-22 NOTE — Progress Notes (Signed)
ANTICOAGULATION CONSULT NOTE - Follow Up Consult  Pharmacy Consult for heparin Indication: Afib and h/o VTE  Labs: Recent Labs    02/19/19 2056 02/19/19 2330 02/20/19 0115 02/20/19 0448 02/20/19 1142 02/21/19 0421 02/21/19 1652 02/22/19 0112  HGB 15.6  --   --  15.3  --  14.9  --  14.3  HCT 48.7  --   --  47.7  --  45.8  --  43.9  PLT 185  --   --  184  --  170  --  159  LABPROT  --   --  24.4*  --   --  20.3*  --   --   INR  --   --  2.2*  --   --  1.7*  --   --   HEPARINUNFRC  --   --   --   --   --   --  0.35 0.32  CREATININE 1.46*  --   --  1.36*  --  1.30*  --   --   TROPONINIHS 173* 196*  --   --  99*  --   --   --     Assessment: 64yo male remains therapeutic on heparin though level is lower than previous despite increased rate; no gtt issues or signs of bleeding per RN.  Goal of Therapy:  Heparin level 0.3-0.7 units/ml   Plan:  Will increase heparin gtt by 1 unit/kg/hr to 1600 units/hr to prevent from dropping below goal and check level in 6 hours.    Wynona Neat, PharmD, BCPS  02/22/2019,1:48 AM

## 2019-02-22 NOTE — Progress Notes (Signed)
[place] for Coumadin, Lovenox Indication: atrial fibrillation, pulmonary embolus and DVT (history)  Allergies  Allergen Reactions  . Simvastatin Diarrhea and Other (See Comments)    Fatigue, diarrhea      Patient Measurements: Height: 6' (182.9 cm) Weight: 255 lb 4.7 oz (115.8 kg) IBW/kg (Calculated) : 77.6   Vital Signs: Temp: 97.8 F (36.6 C) (01/09 0832) Temp Source: Oral (01/09 0832) BP: 160/82 (01/09 0832) Pulse Rate: 68 (01/09 0832)  Labs: Recent Labs    02/19/19 2056 02/19/19 2330 02/20/19 0115 02/20/19 0448 02/20/19 1142 02/21/19 0421 02/21/19 1652 02/22/19 0112 02/22/19 0302  HGB 15.6  --   --  15.3  --  14.9  --  14.3  --   HCT 48.7  --   --  47.7  --  45.8  --  43.9  --   PLT 185  --   --  184  --  170  --  159  --   LABPROT  --   --  24.4*  --   --  20.3*  --  17.4*  --   INR  --   --  2.2*  --   --  1.7*  --  1.4*  --   HEPARINUNFRC  --   --   --   --   --   --  0.35 0.32 0.32  CREATININE 1.46*  --   --  1.36*  --  1.30*  --   --   --   TROPONINIHS 173* 196*  --   --  99*  --   --   --   --     Estimated Creatinine Clearance: 75.4 mL/min (A) (by C-G formula based on SCr of 1.3 mg/dL (H)).  Medications:  Coumadin PTA 9mg  daily except 5mg  Mon and Fri (per clinic records) - Last clinic INR 3.1 on 01/29/19  Assessment: 64 yo M presented with lightheadedness, chest pain, and SOB.  Patient on warfarin PTA for afib, hx DVT/PE. S/p DCCV on 1/8. Switching IV heparin to Lovenox bridge today with warfarin.  INR subtherapeutic, down to 1.4 today after not receiving dose 1/7. Heparin level therapeutic. CBC wnl and stable. No overt bleeding noted.   Planning for discharge today with INR check on Monday. Since patient has 5 mg strength tablets at home and INR is subtherapeutic, will recommend sending home on warfarin 10 mg daily regimen for now and recheck INR in clinic on Monday to assess switching back to prior regimen  listed above.  Goal of Therapy:  INR 2-3 Monitor platelets by anticoagulation protocol: Yes   Plan:  - Stop Heparin - Start Lovenox 120 mg SQ Q12 hours, starting one hour after d/c heparin (~1000) - Warfarin 10 mg PO x1 tonight - Recommend discharge on warfarin 10 mg daily with INR check on Monday (see above) - Monitor for bleeding  Richardine Service, PharmD PGY1 Pharmacy Resident Phone: [phone] 02/22/2019  9:43 AM  Please check [email or web address] for unit-specific pharmacy phone numbers.

## 2019-02-24 ENCOUNTER — Ambulatory Visit (INDEPENDENT_AMBULATORY_CARE_PROVIDER_SITE_OTHER): Payer: Managed Care, Other (non HMO)

## 2019-02-24 ENCOUNTER — Other Ambulatory Visit (INDEPENDENT_AMBULATORY_CARE_PROVIDER_SITE_OTHER): Payer: Managed Care, Other (non HMO)

## 2019-02-24 ENCOUNTER — Other Ambulatory Visit: Payer: Self-pay

## 2019-02-24 DIAGNOSIS — Z86718 Personal history of other venous thrombosis and embolism: Secondary | ICD-10-CM | POA: Diagnosis not present

## 2019-02-24 DIAGNOSIS — I2119 ST elevation (STEMI) myocardial infarction involving other coronary artery of inferior wall: Secondary | ICD-10-CM

## 2019-02-24 DIAGNOSIS — Z5181 Encounter for therapeutic drug level monitoring: Secondary | ICD-10-CM | POA: Diagnosis not present

## 2019-02-24 DIAGNOSIS — I5033 Acute on chronic diastolic (congestive) heart failure: Secondary | ICD-10-CM | POA: Diagnosis not present

## 2019-02-24 DIAGNOSIS — I480 Paroxysmal atrial fibrillation: Secondary | ICD-10-CM

## 2019-02-24 DIAGNOSIS — Z86711 Personal history of pulmonary embolism: Secondary | ICD-10-CM

## 2019-02-24 DIAGNOSIS — Z7901 Long term (current) use of anticoagulants: Secondary | ICD-10-CM

## 2019-02-24 LAB — POCT INR: INR: 2.2 (ref 2.0–3.0)

## 2019-02-24 NOTE — Patient Instructions (Signed)
-   STOP LOVENOX - Continue dosage of 9 mg every day EXCEPT 5 mg on [place]. -  Recheck in 2 weeks.

## 2019-02-26 LAB — BASIC METABOLIC PANEL
BUN/Creatinine Ratio: 21 (ref 10–24)
BUN: 27 mg/dL (ref 8–27)
CO2: 22 mmol/L (ref 20–29)
Calcium: 9.5 mg/dL (ref 8.6–10.2)
Chloride: 94 mmol/L — ABNORMAL LOW (ref 96–106)
Creatinine, Ser: 1.3 mg/dL — ABNORMAL HIGH (ref 0.76–1.27)
GFR calc Af Amer: 67 mL/min/{1.73_m2} (ref >59)
GFR calc non Af Amer: 58 mL/min/{1.73_m2} — ABNORMAL LOW (ref >59)
Glucose: 196 mg/dL — ABNORMAL HIGH (ref 65–99)
Potassium: 4.3 mmol/L (ref 3.5–5.2)
Sodium: 137 mmol/L (ref 134–144)

## 2019-02-27 ENCOUNTER — Telehealth: Payer: Self-pay

## 2019-02-27 NOTE — Telephone Encounter (Signed)
Contacted pt to check on status of home oxygen order. Pt states he has received home oxygen and has been given instructions on use.

## 2019-03-09 NOTE — Progress Notes (Signed)
Cardiology Office Note   Date:  03/10/2019   ID:  PHU RECORD, DOB [birth year], MRN [id number]  PCP:  Adin Hector, MD  Cardiologist:   Minus Breeding, MD   Chief Complaint  Patient presents with  . Shortness of Breath     History of Present Illness: Bobby Lindsey is a 64 y.o. male who presents for follow up of acute on chronic diastolic HF.  In May he was in the hospital for this.  I reviewed these records for this visit.    He presented with volume overload and was in atrial flutter.  Echo was done and his EF was 60 - 65%.  There were no significant valvular abnormalities.    He was also treated for cellulitis.  He did have cardioversion.  Of note he had a slow ventricular rate.  He had diuresis.  Unfortunately he does not know what his weight was after he had been diuresed and when he got home.   I saw him virtually following this.  He had a ZIO patch and he had atrial fib with slow ventricular rates.  He had pauses during sleep with a max pause of 3.8 sec .  He was to have a sleep study but this did not get approved.    Since I last saw him he was in the hospital again with SOB and acute on chronic diastolic HF.   He had atrial flutter and had cardioversion.  He does not really notice his heart rhythm.  Since going home he has lost another 5 pounds because he is trying to eat.  I think he is finally being more compliant with suggestions around low salt and fewer calories.  It looks like his hemoglobin A1c is down to 9.6 might have been greater than 11.  He denies any new shortness of breath, PND or orthopnea.  Has had no new palpitations, presyncope or syncope.  The edema in his remaining leg is improved.  He is not having any new ulcers from venous stasis.   Past Medical History:  Diagnosis Date  . Anginal pain ([place])    last pm  . Atrial fibrillation ([place])    a. Transient during 03/2013 admission.  . Bradycardia    a. Bradycardia/pauses/possible Mobitz II during 03/2013  adm. Not on BB due to this.  Marland Kitchen CAD (coronary artery disease)    a. s/p CABG 2002. b. Hx Cypher stent to the RCA. c. Inf-lat STEMI 03/2013:  LHC (04/05/13):  mLAD occluded, pD1 90, apical br of Dx occluded, CFX occluded, pOM1 90-95, RCA stents patent, diff RCA 30, S-Dx occluded, S-PDA occluded, S-OM1 40-50, L-LAD patent, EF 40% with inf HK.  PCI:  Promus (2.5 x 28) DES to mid to dist CFX.  Marland Kitchen Charcot's joint of knee   . COPD (chronic obstructive pulmonary disease) ([place])   . Deep venous thrombosis (HCC)    right lower extremity  . Diabetes mellitus    a. A1C 10.7 in 03/2013.  . Diastolic CHF ([place])    a. EF 40% by cath, 55-60% during 03/2013 adm, required IV diuresis.  . DVT, lower extremity, recurrent ([place])    a. Hx recurrent DVT per record.  . Dyslipidemia   . Elevated CK    a. Pt has refused rheum workup in the past.  . GERD (gastroesophageal reflux disease)   . History of hiatal hernia   . HTN (hypertension)    x 15 years  . Hx  of cardiovascular stress test    a. Lexiscan Myoview (03/2010):  diaph atten vs inf scar, no ischemia, EF 47%; Low Risk.  Marland Kitchen Hx of echocardiogram    a. Echo (04/08/13):  Mild LVH, EF 55-60%, restrictive physiology, severe LAE, mild reduced RVSF, mild RAE.  . Leg pain    ABI 6/16:  R 1.2, L 1.1 - normal  . Myocardial infarction ([place])   . Obesity   . Peripheral neuropathy   . Pulmonary embolism ([place])   . Sleep apnea     Past Surgical History:  Procedure Laterality Date  . CARDIOVERSION N/A 06/19/2018   Procedure: CARDIOVERSION;  Surgeon: Acie Fredrickson Wonda Cheng, MD;  Location: [facility] ENDOSCOPY;  Service: Cardiovascular;  Laterality: N/A;  . CARDIOVERSION N/A 02/21/2019   Procedure: CARDIOVERSION;  Surgeon: Fay Records, MD;  Location: [place];  Service: Cardiovascular;  Laterality: N/A;  . CORONARY ANGIOPLASTY WITH STENT PLACEMENT    . CORONARY ARTERY BYPASS GRAFT     4 time since 2002  . LEFT HEART CATHETERIZATION WITH CORONARY/GRAFT ANGIOGRAM  04/05/2013   Procedure:  LEFT HEART CATHETERIZATION WITH Beatrix Fetters;  Surgeon: Peter M Martinique, MD;  Location: [facility] CATH LAB;  Service: Cardiovascular;;  . left knee surgery    . LOWER EXTREMITY ANGIOGRAPHY Left 12/25/2016   Procedure: LOWER EXTREMITY ANGIOGRAPHY;  Surgeon: Algernon Huxley, MD;  Location: [facility] CV LAB;  Service: Cardiovascular;  Laterality: Left;  . PERCUTANEOUS CORONARY STENT INTERVENTION (PCI-S)  04/05/2013   Procedure: PERCUTANEOUS CORONARY STENT INTERVENTION (PCI-S);  Surgeon: Peter M Martinique, MD;  Location: [facility] C[facility] LAB;  Service: Cardiovascular;;  DES to native Mid cx  . VASCULAR SURGERY       Current Outpatient Medications  Medication Sig Dispense Refill  . acetaminophen (TYLENOL) 500 MG tablet Take 1,000 mg every 8 (eight) hours as needed by mouth for mild pain or headache.     Marland Kitchen atorvastatin (LIPITOR) 20 MG tablet Take 1 tablet (20 mg total) by mouth daily. 90 tablet 3  . Blood Glucose Monitoring Suppl (FIFTY50 GLUCOSE METER 2.0) w/Device KIT Use as directed.    . enoxaparin (LOVENOX) 120 MG/0.8ML injection Inject 0.8 mLs (120 mg total) into the skin every 12 (twelve) hours. 8 mL 0  . fluticasone (FLONASE) 50 MCG/ACT nasal spray Place 2 sprays into both nostrils daily as needed for allergies or rhinitis.     Marland Kitchen gabapentin (NEURONTIN) 300 MG capsule Take 600 mg by mouth at bedtime as needed (pain in foot and ankle).     . Insulin Glargine, 1 Unit Dial, (TOUJEO SOLOSTAR) 300 UNIT/ML SOPN Inject 70 Units into the skin every morning.     . insulin lispro (HUMALOG KWIKPEN) 100 UNIT/ML injection Inject 36 Units into the skin 3 (three) times daily before meals.     . Insulin Syringe-Needle U-100 31G X 5/16" 0.3 ML MISC use as directed    . isosorbide mononitrate (IMDUR) 30 MG 24 hr tablet Take 1 tablet (30 mg total) by mouth daily. 90 tablet 3  . losartan (COZAAR) 25 MG tablet Take 1 tablet (25 mg total) by mouth daily. 90 tablet 3  . metFORMIN (GLUCOPHAGE-XR) 500 MG 24 hr tablet Take  500 mg by mouth daily with breakfast.    . nitroGLYCERIN (NITROSTAT) 0.4 MG SL tablet Place 1 tablet (0.4 mg total) under the tongue every 5 (five) minutes as needed for chest pain. NEEDS APPOINTMENT 25 tablet 6  . pantoprazole (PROTONIX) 40 MG tablet Take 1 tablet (40 mg  total) by mouth every evening. 90 tablet 1  . potassium chloride (K-DUR) 10 MEQ tablet Take 10 mEq by mouth every morning.    . torsemide (DEMADEX) 20 MG tablet Take 2 tablets (40 mg total) by mouth 2 (two) times daily. 180 tablet 4  . warfarin (COUMADIN) 10 MG tablet Take 1 tablet (10 mg total) by mouth one time only at 6 PM. 30 tablet 0   No current facility-administered medications for this visit.    Allergies:   Simvastatin    ROS:  Please see the history of present illness.   Otherwise, review of systems are positive for none.   All other systems are reviewed and negative.    PHYSICAL EXAM: VS:  BP (!) 150/81   Pulse 76   Temp 97.9 F (36.6 C)   Ht 6' (1.829 m)   Wt 260 lb 12.8 oz (118.3 kg)   SpO2 99%   BMI 35.37 kg/m  , BMI Body mass index is 35.37 kg/m. GENERAL:  Well appearing NECK:  No jugular venous distention, waveform within normal limits, carotid upstroke brisk and symmetric, no bruits, no thyromegaly LUNGS:  Clear to auscultation bilaterally CHEST:  Well healed sternotomy scar. HEART:  PMI not displaced or sustained,S1 and S2 within normal limits, no S3, no S4, no clicks, no rubs, no murmurs ABD:  Flat, positive bowel sounds normal in frequency in pitch, no bruits, no rebound, no guarding, no midline pulsatile mass, no hepatomegaly, no splenomegaly EXT:  2 plus pulses upper absent dorsalis pedis and posterior tibialis, moderate right leg edema with a blister, no cyanosis no clubbing, s/p left BKA.,  Chronic venous stasis changes   EKG:  EKG is ordered today.    Recent Labs: 06/17/2018: TSH 3.144 06/20/2018: Magnesium 2.1 02/20/2019: B Natriuretic Peptide 259.4 02/21/2019: ALT 27 02/22/2019:  Hemoglobin 14.3; Platelets 159 02/24/2019: BUN 27; Creatinine, Ser 1.30; Potassium 4.3; Sodium 137    Lipid Panel    Component Value Date/Time   CHOL 123 04/18/2018 0833   TRIG 117 04/18/2018 0833   HDL 37 (L) 04/18/2018 0833   CHOLHDL 3.3 04/18/2018 0833   CHOLHDL 3.6 CALC 03/11/2008 0930   VLDL 21 03/11/2008 0930   LDLCALC 63 04/18/2018 0833      Wt Readings from Last 3 Encounters:  03/10/19 260 lb 12.8 oz (118.3 kg)  02/22/19 255 lb 4.7 oz (115.8 kg)  02/04/19 264 lb 3.2 oz (119.8 kg)    Other studies Reviewed: Additional studies/ records that were reviewed today include: Labs.  Hospital records Review of the above records demonstrates:  See eslewhere   ASSESSMENT AND PLAN:  ACUTE ON CHRONIC DIASTOLIC HF:     Today he seems to be euvolemic.  I am going to continue the meds as listed.  His creatinine was stable.  No change in therapy.  CAD:  Has had no new chest pain.  He will continue with risk reduction.   RFF:MBWGY pressure is elevated today but he says at home it is in the 120s over 70s so no change in therapy.   HYPERLIPIDEMIA: LDL was 63.  No change in therapy.   DM:A1c was 9.6 which is improved from 11.7.  He follows with his endocrinologist.  OSA:We had no success in getting a sleep study approved for him.   ATRIAL FLUTTER:  He had cardioversion.  He had bradycardia and beta blocker was held.   I doubt that he will maintain this rhythm but I also do not think it  is contributing very significantly to his symptoms.  He remains off beta-blocker.  We talked about this.  He tolerates anticoagulation.  We will check his Coumadin level today and he was at target.  No change in therapy.   DYSPNEA:   This is much improved.  He does have as needed oxygen now at his home.  I think the key is salt restriction, fluid restriction and weight management that he may be finally participating in this.   AKI: His creatinine was stable 2 weeks ago.  No change in  therapy.   Current medicines are reviewed at length with the patient today.  The patient does not have concerns regarding medicines.  The following changes have been made:  None  Labs/ tests ordered today include: See elsewhere  Orders Placed This Encounter  Procedures  . EKG 12-Lead     Disposition:   FU with me in 3 months.   Signed, Minus Breeding, MD  03/10/2019 11:47 AM    [facility]

## 2019-03-10 ENCOUNTER — Encounter: Payer: Self-pay | Admitting: Cardiology

## 2019-03-10 ENCOUNTER — Ambulatory Visit (INDEPENDENT_AMBULATORY_CARE_PROVIDER_SITE_OTHER): Payer: Managed Care, Other (non HMO) | Admitting: Pharmacist Clinician (PhC)/ Clinical Pharmacy Specialist

## 2019-03-10 ENCOUNTER — Ambulatory Visit: Payer: Managed Care, Other (non HMO) | Admitting: Cardiology

## 2019-03-10 ENCOUNTER — Other Ambulatory Visit: Payer: Self-pay

## 2019-03-10 VITALS — BP 150/81 | HR 76 | Temp 97.9°F | Ht 72.0 in | Wt 260.8 lb

## 2019-03-10 DIAGNOSIS — Z5181 Encounter for therapeutic drug level monitoring: Secondary | ICD-10-CM

## 2019-03-10 DIAGNOSIS — I2510 Atherosclerotic heart disease of native coronary artery without angina pectoris: Secondary | ICD-10-CM | POA: Diagnosis not present

## 2019-03-10 DIAGNOSIS — N179 Acute kidney failure, unspecified: Secondary | ICD-10-CM

## 2019-03-10 DIAGNOSIS — E118 Type 2 diabetes mellitus with unspecified complications: Secondary | ICD-10-CM

## 2019-03-10 DIAGNOSIS — I5033 Acute on chronic diastolic (congestive) heart failure: Secondary | ICD-10-CM

## 2019-03-10 DIAGNOSIS — E785 Hyperlipidemia, unspecified: Secondary | ICD-10-CM

## 2019-03-10 DIAGNOSIS — Z86718 Personal history of other venous thrombosis and embolism: Secondary | ICD-10-CM

## 2019-03-10 DIAGNOSIS — Z794 Long term (current) use of insulin: Secondary | ICD-10-CM

## 2019-03-10 DIAGNOSIS — I2119 ST elevation (STEMI) myocardial infarction involving other coronary artery of inferior wall: Secondary | ICD-10-CM | POA: Diagnosis not present

## 2019-03-10 DIAGNOSIS — I10 Essential (primary) hypertension: Secondary | ICD-10-CM

## 2019-03-10 DIAGNOSIS — I480 Paroxysmal atrial fibrillation: Secondary | ICD-10-CM

## 2019-03-10 DIAGNOSIS — Z7901 Long term (current) use of anticoagulants: Secondary | ICD-10-CM

## 2019-03-10 DIAGNOSIS — Z86711 Personal history of pulmonary embolism: Secondary | ICD-10-CM

## 2019-03-10 LAB — POCT INR: INR: 2.3 (ref 2.0–3.0)

## 2019-03-10 NOTE — Patient Instructions (Signed)
Medication Instructions:  No Changes *If you need a refill on your cardiac medications before your next appointment, please call your pharmacy*  Lab Work: None  Testing/Procedures: None  Follow-Up: At [facility], you and your health needs are our priority.  As part of our continuing mission to provide you with exceptional heart care, we have created designated Provider Care Teams.  These Care Teams include your primary Cardiologist (physician) and Advanced Practice Providers (APPs -  Physician Assistants and Nurse Practitioners) who all work together to provide you with the care you need, when you need it.  Your next appointment:   3 month(s)  The format for your next appointment:   In Person  Provider:   James Hochrein, MD   

## 2019-03-11 ENCOUNTER — Other Ambulatory Visit: Payer: Self-pay | Admitting: Cardiology

## 2019-04-02 ENCOUNTER — Other Ambulatory Visit: Payer: Self-pay | Admitting: Cardiology

## 2019-04-02 DIAGNOSIS — I25810 Atherosclerosis of coronary artery bypass graft(s) without angina pectoris: Secondary | ICD-10-CM

## 2019-04-09 ENCOUNTER — Ambulatory Visit (INDEPENDENT_AMBULATORY_CARE_PROVIDER_SITE_OTHER): Payer: Managed Care, Other (non HMO)

## 2019-04-09 ENCOUNTER — Other Ambulatory Visit: Payer: Self-pay

## 2019-04-09 DIAGNOSIS — Z86718 Personal history of other venous thrombosis and embolism: Secondary | ICD-10-CM

## 2019-04-09 DIAGNOSIS — I2119 ST elevation (STEMI) myocardial infarction involving other coronary artery of inferior wall: Secondary | ICD-10-CM

## 2019-04-09 DIAGNOSIS — Z5181 Encounter for therapeutic drug level monitoring: Secondary | ICD-10-CM

## 2019-04-09 DIAGNOSIS — I480 Paroxysmal atrial fibrillation: Secondary | ICD-10-CM

## 2019-04-09 DIAGNOSIS — Z7901 Long term (current) use of anticoagulants: Secondary | ICD-10-CM

## 2019-04-09 DIAGNOSIS — Z86711 Personal history of pulmonary embolism: Secondary | ICD-10-CM

## 2019-04-09 LAB — POCT INR: INR: 2.4 (ref 2.0–3.0)

## 2019-04-09 NOTE — Patient Instructions (Signed)
Continue dosage of 9 mg every day EXCEPT 5 mg on [place]. Recheck in 3 weeks.

## 2019-04-29 ENCOUNTER — Other Ambulatory Visit: Payer: Self-pay | Admitting: Cardiology

## 2019-04-29 DIAGNOSIS — I5033 Acute on chronic diastolic (congestive) heart failure: Secondary | ICD-10-CM

## 2019-04-30 ENCOUNTER — Other Ambulatory Visit: Payer: Self-pay

## 2019-04-30 ENCOUNTER — Ambulatory Visit (INDEPENDENT_AMBULATORY_CARE_PROVIDER_SITE_OTHER): Payer: Managed Care, Other (non HMO)

## 2019-04-30 DIAGNOSIS — Z7901 Long term (current) use of anticoagulants: Secondary | ICD-10-CM

## 2019-04-30 DIAGNOSIS — Z86711 Personal history of pulmonary embolism: Secondary | ICD-10-CM | POA: Diagnosis not present

## 2019-04-30 DIAGNOSIS — I2119 ST elevation (STEMI) myocardial infarction involving other coronary artery of inferior wall: Secondary | ICD-10-CM | POA: Diagnosis not present

## 2019-04-30 DIAGNOSIS — Z5181 Encounter for therapeutic drug level monitoring: Secondary | ICD-10-CM | POA: Diagnosis not present

## 2019-04-30 DIAGNOSIS — Z86718 Personal history of other venous thrombosis and embolism: Secondary | ICD-10-CM | POA: Diagnosis not present

## 2019-04-30 DIAGNOSIS — I480 Paroxysmal atrial fibrillation: Secondary | ICD-10-CM

## 2019-04-30 LAB — POCT INR: INR: 3.1 — AB (ref 2.0–3.0)

## 2019-04-30 NOTE — Patient Instructions (Signed)
-   Have a large serving of greens tonight - continue dosage of 9 mg every day EXCEPT 5 mg on MONDAYS & FRIDAYS. - recheck in 4 weeks.

## 2019-05-09 ENCOUNTER — Other Ambulatory Visit: Payer: Self-pay | Admitting: Cardiology

## 2019-05-14 ENCOUNTER — Ambulatory Visit: Payer: Self-pay | Admitting: Dermatology

## 2019-06-05 DIAGNOSIS — N184 Chronic kidney disease, stage 4 (severe): Secondary | ICD-10-CM | POA: Insufficient documentation

## 2019-06-05 NOTE — Progress Notes (Signed)
Cardiology Office Note   Date:  06/06/2019   ID:  Bobby Lindsey, DOB May 26, 1955, MRN [id number]  PCP:  Adin Hector, MD  Cardiologist:   Minus Breeding, MD   Chief Complaint  Patient presents with  . Shortness of Breath     History of Present Illness: Bobby Lindsey is a 64 y.o. male who presents for follow up of acute on chronic diastolic HF.  In May 2020 he was in the hospital for this.  I reviewed these records for this visit.    He presented with volume overload and was in atrial flutter.  Echo was done and his EF was 60 - 65%.  There were no significant valvular abnormalities.    He was also treated for cellulitis.  He did have cardioversion.  Of note he had a slow ventricular rate.  He had diuresis.  Unfortunately he does not know what his weight was after he had been diuresed and when he got home.   I saw him virtually following this.  He had a ZIO patch and he had atrial fib with slow ventricular rates.  He had pauses during sleep with a max pause of 3.8 sec .  He was to have a sleep study but this did not get approved.  He was in the hospital again in Jan with SOB and acute on chronic diastolic HF.   He had atrial flutter and had cardioversion.    Since I last saw him he does get short of breath walking a mile distance on level ground.  This is probably about the same.  His weight seems to be up about 3 pounds but he is not weighing himself daily.  He is watching his salt.  He denies any chest pressure, neck or arm discomfort.  Has had no new palpitations, presyncope or syncope.  He may have had one episode of dizziness.    Past Medical History:  Diagnosis Date  . Anginal pain ([place])    last pm  . Atrial fibrillation ([place])    a. Transient during 03/2013 admission.  . Bradycardia    a. Bradycardia/pauses/possible Mobitz II during 03/2013 adm. Not on BB due to this.  Marland Kitchen CAD (coronary artery disease)    a. s/p CABG 2002. b. Hx Cypher stent to the RCA. c. Inf-lat STEMI  03/2013:  LHC (04/05/13):  mLAD occluded, pD1 90, apical br of Dx occluded, CFX occluded, pOM1 90-95, RCA stents patent, diff RCA 30, S-Dx occluded, S-PDA occluded, S-OM1 40-50, L-LAD patent, EF 40% with inf HK.  PCI:  Promus (2.5 x 28) DES to mid to dist CFX.  Marland Kitchen Charcot's joint of knee   . COPD (chronic obstructive pulmonary disease) ([place])   . Deep venous thrombosis (HCC)    right lower extremity  . Diabetes mellitus    a. A1C 10.7 in 03/2013.  . Diastolic CHF ([place])    a. EF 40% by cath, 55-60% during 03/2013 adm, required IV diuresis.  . DVT, lower extremity, recurrent ([place])    a. Hx recurrent DVT per record.  . Dyslipidemia   . Elevated CK    a. Pt has refused rheum workup in the past.  . GERD (gastroesophageal reflux disease)   . History of epidermal inclusion cyst excision 07/09/2018  . History of hiatal hernia   . HTN (hypertension)    x 15 years  . Hx of cardiovascular stress test    a. Lexiscan Myoview (03/2010):  diaph atten  vs inf scar, no ischemia, EF 47%; Low Risk.  Marland Kitchen Hx of echocardiogram    a. Echo (04/08/13):  Mild LVH, EF 55-60%, restrictive physiology, severe LAE, mild reduced RVSF, mild RAE.  . Leg pain    ABI 6/16:  R 1.2, L 1.1 - normal  . Myocardial infarction ([place])   . Obesity   . Peripheral neuropathy   . Pulmonary embolism ([place])   . Sleep apnea     Past Surgical History:  Procedure Laterality Date  . CARDIOVERSION N/A 06/19/2018   Procedure: CARDIOVERSION;  Surgeon: Acie Fredrickson Wonda Cheng, MD;  Location[facility] ENDOSCOPY;  Service: Cardiovascular;  Laterality: N/A;  . CARDIOVERSION N/A 02/21/2019   Procedure: CARDIOVERSION;  Surgeon: Fay Records, MD;  Location: [place];  Service: Cardiovascular;  Laterality: N/A;  . CORONARY ANGIOPLASTY WITH STENT PLACEMENT    . CORONARY ARTERY BYPASS GRAFT     4 time since 2002  . LEFT HEART CATHETERIZATION WITH CORONARY/GRAFT ANGIOGRAM  04/05/2013   Procedure: LEFT HEART CATHETERIZATION WITH Beatrix Fetters;  Surgeon:  Peter M Martinique, MD;  Location: [facility] CATH LAB;  Service: Cardiovascular;;  . left knee surgery    . LOWER EXTREMITY ANGIOGRAPHY Left 12/25/2016   Procedure: LOWER EXTREMITY ANGIOGRAPHY;  Surgeon: Algernon Huxley, MD;  Location: [place] CV LAB;  Service: Cardiovascular;  Laterality: Left;  . PERCUTANEOUS CORONARY STENT INTERVENTION (PCI-S)  04/05/2013   Procedure: PERCUTANEOUS CORONARY STENT INTERVENTION (PCI-S);  Surgeon: Peter M Martinique, MD;  Location: [facility] CATH LAB;  Service: Cardiovascular;;  DES to native Mid cx  . VASCULAR SURGERY       Current Outpatient Medications  Medication Sig Dispense Refill  . acetaminophen (TYLENOL) 500 MG tablet Take 1,000 mg every 8 (eight) hours as needed by mouth for mild pain or headache.     Marland Kitchen atorvastatin (LIPITOR) 20 MG tablet TAKE 1 TABLET BY MOUTH EVERY DAY 90 tablet 2  . Blood Glucose Monitoring Suppl (FIFTY50 GLUCOSE METER 2.0) w/Device KIT Use as directed.    . enoxaparin (LOVENOX) 120 MG/0.8ML injection Inject 0.8 mLs (120 mg total) into the skin every 12 (twelve) hours. 8 mL 0  . fluticasone (FLONASE) 50 MCG/ACT nasal spray Place 2 sprays into both nostrils daily as needed for allergies or rhinitis.     Marland Kitchen gabapentin (NEURONTIN) 300 MG capsule Take 600 mg by mouth at bedtime as needed (pain in foot and ankle).     . Insulin Glargine, 1 Unit Dial, (TOUJEO SOLOSTAR) 300 UNIT/ML SOPN Inject 70 Units into the skin every morning.     . insulin lispro (HUMALOG KWIKPEN) 100 UNIT/ML injection Inject 36 Units into the skin 3 (three) times daily before meals.     . Insulin Syringe-Needle U-100 31G X 5/16" 0.3 ML MISC use as directed    . isosorbide mononitrate (IMDUR) 30 MG 24 hr tablet TAKE 1 TABLET BY MOUTH EVERY DAY 90 tablet 3  . losartan (COZAAR) 25 MG tablet TAKE 1 TABLET BY MOUTH EVERY DAY 90 tablet 3  . metFORMIN (GLUCOPHAGE-XR) 500 MG 24 hr tablet Take 500 mg by mouth daily with breakfast.    . nitroGLYCERIN (NITROSTAT) 0.4 MG SL tablet Place 1 tablet  (0.4 mg total) under the tongue every 5 (five) minutes as needed for chest pain. NEEDS APPOINTMENT 25 tablet 6  . pantoprazole (PROTONIX) 40 MG tablet TAKE 1 TABLET BY MOUTH EVERY DAY IN THE EVENING 90 tablet 3  . potassium chloride (K-DUR) 10 MEQ tablet Take 10 mEq by  mouth every morning.    . torsemide (DEMADEX) 20 MG tablet Take 2 tablets (40 mg total) by mouth 2 (two) times daily. 180 tablet 4  . warfarin (COUMADIN) 10 MG tablet Take 1 tablet (10 mg total) by mouth one time only at 6 PM. 30 tablet 0   No current facility-administered medications for this visit.    Allergies:   Simvastatin    ROS:  Please see the history of present illness.   Otherwise, review of systems are positive for none.   All other systems are reviewed and negative.    PHYSICAL EXAM: VS:  BP 136/72 (BP Location: Right Arm, Patient Position: Sitting, Cuff Size: Normal)   Pulse 68   Temp 97.9 F (36.6 C)   Ht 6' (1.829 m)   Wt 263 lb (119.3 kg)   BMI 35.67 kg/m  , BMI Body mass index is 35.67 kg/m. GENERAL:  Well appearing NECK:  No jugular venous distention, waveform within normal limits, carotid upstroke brisk and symmetric, no bruits, no thyromegaly LUNGS:  Clear to auscultation bilaterally CHEST:  Unremarkable HEART:  PMI not displaced or sustained,S1 and S2 within normal limits, no S3, no S4, no clicks, no rubs, no murmurs ABD:  Flat, positive bowel sounds normal in frequency in pitch, no bruits, no rebound, no guarding, no midline pulsatile mass, no hepatomegaly, no splenomegaly EXT:  2 plus pulses upper, absent dorsalis pedis and posterior tibialis on the right, chronic venous stasis changes, no edema, no cyanosis no clubbing, status post amputation on the left,   EKG:  EKG is  ordered today. Sinus rhythm, rate 63, axis within normal limits, borderline first-degree AV block, old inferior infarct.  Recent Labs: 06/17/2018: TSH 3.144 06/20/2018: Magnesium 2.1 02/20/2019: B Natriuretic Peptide  259.4 02/21/2019: ALT 27 02/22/2019: Hemoglobin 14.3; Platelets 159 02/24/2019: BUN 27; Creatinine, Ser 1.30; Potassium 4.3; Sodium 137    Lipid Panel    Component Value Date/Time   CHOL 123 04/18/2018 0833   TRIG 117 04/18/2018 0833   HDL 37 (L) 04/18/2018 0833   CHOLHDL 3.3 04/18/2018 0833   CHOLHDL 3.6 CALC 03/11/2008 0930   VLDL 21 03/11/2008 0930   LDLCALC 63 04/18/2018 0833      Wt Readings from Last 3 Encounters:  06/06/19 263 lb (119.3 kg)  03/10/19 260 lb 12.8 oz (118.3 kg)  02/22/19 255 lb 4.7 oz (115.8 kg)    Other studies Reviewed: Additional studies/ records that were reviewed today include: None Review of the above records demonstrates:  NA   ASSESSMENT AND PLAN:  ACUTE ON CHRONIC DIASTOLIC HF:    His weight is up a little bit.  He is a little bit more dyspneic perhaps than previous.  I am getting give him an extra 2 days of 20 mg of Lasix.  We talked again about salt and fluid restriction and daily weights.  He needs to adhere with daily weights.  CAD:   The patient has no new sypmtoms.  No further cardiovascular testing is indicated.  We will continue with aggressive risk reduction and meds as listed.  FGH:WEXHB pressure is at target.  No change in therapy.   HYPERLIPIDEMIA: LDL was 63 with HDL 37.  No change in therapy.   DM:A1c was 9.2 down from 9.6 which is improved from 11.7.  He continues to follow with his endocrinologist.  ZJI:RCVELFYBO would not cover CPAP.  PVD: He does have known peripheral vascular disease and diminished pulses.  He would like to follow with  vascular surgery and I think this is appropriate and [place].  ATRIAL FLUTTER:    He seems to be maintaining sinus rhythm post cardioversion.  He is up-to-date with his anticoagulation.  DYSPNEA:   This is as above.  I will change the meds as indicated.   AKI: His creatinine was stable in January was rechecked when he comes back.   COVID EDUCATION: He is hesitant to take  vaccine we had a long discussion about this.   Current medicines are reviewed at length with the patient today.  The patient does not have concerns regarding medicines.  The following changes have been made:  None  Labs/ tests ordered today include: See elsewhere  Orders Placed This Encounter  Procedures  . EKG 12-Lead     Disposition:   FU with me in 3 months.  Signed, Minus Breeding, MD  06/06/2019 11:55 AM    [facility]

## 2019-06-06 ENCOUNTER — Other Ambulatory Visit: Payer: Self-pay

## 2019-06-06 ENCOUNTER — Ambulatory Visit: Payer: Managed Care, Other (non HMO) | Admitting: Cardiology

## 2019-06-06 ENCOUNTER — Ambulatory Visit (INDEPENDENT_AMBULATORY_CARE_PROVIDER_SITE_OTHER): Payer: Managed Care, Other (non HMO) | Admitting: Pharmacist Clinician (PhC)/ Clinical Pharmacy Specialist

## 2019-06-06 ENCOUNTER — Encounter: Payer: Self-pay | Admitting: Cardiology

## 2019-06-06 VITALS — BP 136/72 | HR 68 | Temp 97.9°F | Ht 72.0 in | Wt 263.0 lb

## 2019-06-06 DIAGNOSIS — I2119 ST elevation (STEMI) myocardial infarction involving other coronary artery of inferior wall: Secondary | ICD-10-CM

## 2019-06-06 DIAGNOSIS — Z86718 Personal history of other venous thrombosis and embolism: Secondary | ICD-10-CM

## 2019-06-06 DIAGNOSIS — I10 Essential (primary) hypertension: Secondary | ICD-10-CM | POA: Diagnosis not present

## 2019-06-06 DIAGNOSIS — E118 Type 2 diabetes mellitus with unspecified complications: Secondary | ICD-10-CM

## 2019-06-06 DIAGNOSIS — Z86711 Personal history of pulmonary embolism: Secondary | ICD-10-CM

## 2019-06-06 DIAGNOSIS — Z7189 Other specified counseling: Secondary | ICD-10-CM

## 2019-06-06 DIAGNOSIS — N184 Chronic kidney disease, stage 4 (severe): Secondary | ICD-10-CM

## 2019-06-06 DIAGNOSIS — I480 Paroxysmal atrial fibrillation: Secondary | ICD-10-CM

## 2019-06-06 DIAGNOSIS — Z794 Long term (current) use of insulin: Secondary | ICD-10-CM

## 2019-06-06 DIAGNOSIS — I2510 Atherosclerotic heart disease of native coronary artery without angina pectoris: Secondary | ICD-10-CM | POA: Diagnosis not present

## 2019-06-06 DIAGNOSIS — Z5181 Encounter for therapeutic drug level monitoring: Secondary | ICD-10-CM

## 2019-06-06 DIAGNOSIS — Z7901 Long term (current) use of anticoagulants: Secondary | ICD-10-CM

## 2019-06-06 DIAGNOSIS — I484 Atypical atrial flutter: Secondary | ICD-10-CM

## 2019-06-06 DIAGNOSIS — E785 Hyperlipidemia, unspecified: Secondary | ICD-10-CM | POA: Diagnosis not present

## 2019-06-06 LAB — POCT INR: INR: 2.4 (ref 2.0–3.0)

## 2019-06-06 NOTE — Patient Instructions (Signed)
Medication Instructions:  TAKE AN ADDITIONAL 20MG  OF LASIX THIS SATURDAY AND SUNDAY *If you need a refill on your cardiac medications before your next appointment, please call your pharmacy*  Lab Work: NONE ORDERED THIS VISIT  Testing/Procedures: NONE ORDERED THIS VISIT  Follow-Up: At [facility], you and your health needs are our priority.  As part of our continuing mission to provide you with exceptional heart care, we have created designated Provider Care Teams.  These Care Teams include your primary Cardiologist (physician) and Advanced Practice Providers (APPs -  Physician Assistants and Nurse Practitioners) who all work together to provide you with the care you need, when you need it.   Your next appointment:   2 month(s)  The format for your next appointment:   In Person  Provider:   HAO MENG, PA-C  Other Instructions COVID-19 vaccination appointments are now available. Go to [email or web address] or call [phone].

## 2019-06-30 ENCOUNTER — Other Ambulatory Visit: Payer: Self-pay | Admitting: Cardiology

## 2019-07-09 ENCOUNTER — Ambulatory Visit (INDEPENDENT_AMBULATORY_CARE_PROVIDER_SITE_OTHER): Payer: Managed Care, Other (non HMO)

## 2019-07-09 ENCOUNTER — Other Ambulatory Visit: Payer: Self-pay

## 2019-07-09 DIAGNOSIS — Z5181 Encounter for therapeutic drug level monitoring: Secondary | ICD-10-CM | POA: Diagnosis not present

## 2019-07-09 DIAGNOSIS — I2119 ST elevation (STEMI) myocardial infarction involving other coronary artery of inferior wall: Secondary | ICD-10-CM | POA: Diagnosis not present

## 2019-07-09 DIAGNOSIS — I480 Paroxysmal atrial fibrillation: Secondary | ICD-10-CM

## 2019-07-09 DIAGNOSIS — Z86718 Personal history of other venous thrombosis and embolism: Secondary | ICD-10-CM

## 2019-07-09 DIAGNOSIS — Z86711 Personal history of pulmonary embolism: Secondary | ICD-10-CM

## 2019-07-09 DIAGNOSIS — Z7901 Long term (current) use of anticoagulants: Secondary | ICD-10-CM

## 2019-07-09 LAB — POCT INR: INR: 2 (ref 2.0–3.0)

## 2019-07-09 NOTE — Patient Instructions (Signed)
-   don't have any greens for the next couple of days - continue dosage of 9 mg every day EXCEPT 5 mg on [place]. - recheck in 4 weeks.

## 2019-07-27 ENCOUNTER — Other Ambulatory Visit: Payer: Self-pay | Admitting: Cardiology

## 2019-08-06 ENCOUNTER — Ambulatory Visit (INDEPENDENT_AMBULATORY_CARE_PROVIDER_SITE_OTHER): Payer: Managed Care, Other (non HMO)

## 2019-08-06 ENCOUNTER — Other Ambulatory Visit: Payer: Self-pay

## 2019-08-06 DIAGNOSIS — I2119 ST elevation (STEMI) myocardial infarction involving other coronary artery of inferior wall: Secondary | ICD-10-CM

## 2019-08-06 DIAGNOSIS — I480 Paroxysmal atrial fibrillation: Secondary | ICD-10-CM

## 2019-08-06 DIAGNOSIS — Z5181 Encounter for therapeutic drug level monitoring: Secondary | ICD-10-CM | POA: Diagnosis not present

## 2019-08-06 DIAGNOSIS — Z86711 Personal history of pulmonary embolism: Secondary | ICD-10-CM

## 2019-08-06 DIAGNOSIS — Z86718 Personal history of other venous thrombosis and embolism: Secondary | ICD-10-CM | POA: Diagnosis not present

## 2019-08-06 DIAGNOSIS — Z7901 Long term (current) use of anticoagulants: Secondary | ICD-10-CM

## 2019-08-06 LAB — POCT INR: INR: 1.6 — AB (ref 2.0–3.0)

## 2019-08-06 NOTE — Patient Instructions (Signed)
-    take 10 mg (2 of the 5 mg tablets) today and tomorrow, then  - continue dosage of 9 mg every day EXCEPT 5 mg on [place]. - recheck in 2 weeks.

## 2019-08-20 ENCOUNTER — Other Ambulatory Visit: Payer: Self-pay

## 2019-08-20 ENCOUNTER — Ambulatory Visit (INDEPENDENT_AMBULATORY_CARE_PROVIDER_SITE_OTHER): Payer: Managed Care, Other (non HMO)

## 2019-08-20 DIAGNOSIS — Z86718 Personal history of other venous thrombosis and embolism: Secondary | ICD-10-CM

## 2019-08-20 DIAGNOSIS — Z5181 Encounter for therapeutic drug level monitoring: Secondary | ICD-10-CM | POA: Diagnosis not present

## 2019-08-20 DIAGNOSIS — Z86711 Personal history of pulmonary embolism: Secondary | ICD-10-CM

## 2019-08-20 DIAGNOSIS — Z7901 Long term (current) use of anticoagulants: Secondary | ICD-10-CM

## 2019-08-20 DIAGNOSIS — I2119 ST elevation (STEMI) myocardial infarction involving other coronary artery of inferior wall: Secondary | ICD-10-CM

## 2019-08-20 DIAGNOSIS — I480 Paroxysmal atrial fibrillation: Secondary | ICD-10-CM

## 2019-08-20 LAB — POCT INR: INR: 3.2 — AB (ref 2.0–3.0)

## 2019-08-20 NOTE — Patient Instructions (Signed)
-    take 5 mg today then  - continue dosage of 9 mg every day EXCEPT 5 mg on [place]. - recheck in 2 weeks.

## 2019-08-26 ENCOUNTER — Other Ambulatory Visit: Payer: Self-pay | Admitting: Pharmacist Clinician (PhC)/ Clinical Pharmacy Specialist

## 2019-08-26 ENCOUNTER — Other Ambulatory Visit: Payer: Self-pay

## 2019-08-26 ENCOUNTER — Encounter: Payer: Self-pay | Admitting: Physician Assistant

## 2019-08-26 ENCOUNTER — Ambulatory Visit (INDEPENDENT_AMBULATORY_CARE_PROVIDER_SITE_OTHER): Payer: Managed Care, Other (non HMO) | Admitting: Physician Assistant

## 2019-08-26 VITALS — BP 128/72 | HR 70 | Ht 66.0 in | Wt 262.0 lb

## 2019-08-26 DIAGNOSIS — Z89512 Acquired absence of left leg below knee: Secondary | ICD-10-CM

## 2019-08-26 DIAGNOSIS — I10 Essential (primary) hypertension: Secondary | ICD-10-CM | POA: Diagnosis not present

## 2019-08-26 DIAGNOSIS — I25810 Atherosclerosis of coronary artery bypass graft(s) without angina pectoris: Secondary | ICD-10-CM

## 2019-08-26 DIAGNOSIS — Z79899 Other long term (current) drug therapy: Secondary | ICD-10-CM | POA: Diagnosis not present

## 2019-08-26 DIAGNOSIS — Z86718 Personal history of other venous thrombosis and embolism: Secondary | ICD-10-CM

## 2019-08-26 DIAGNOSIS — I480 Paroxysmal atrial fibrillation: Secondary | ICD-10-CM

## 2019-08-26 DIAGNOSIS — I5033 Acute on chronic diastolic (congestive) heart failure: Secondary | ICD-10-CM | POA: Diagnosis not present

## 2019-08-26 DIAGNOSIS — E119 Type 2 diabetes mellitus without complications: Secondary | ICD-10-CM

## 2019-08-26 DIAGNOSIS — E785 Hyperlipidemia, unspecified: Secondary | ICD-10-CM

## 2019-08-26 LAB — BASIC METABOLIC PANEL
BUN/Creatinine Ratio: 25 — ABNORMAL HIGH (ref 10–24)
BUN: 33 mg/dL — ABNORMAL HIGH (ref 8–27)
CO2: 24 mmol/L (ref 20–29)
Calcium: 9.3 mg/dL (ref 8.6–10.2)
Chloride: 99 mmol/L (ref 96–106)
Creatinine, Ser: 1.3 mg/dL — ABNORMAL HIGH (ref 0.76–1.27)
GFR calc Af Amer: 67 mL/min/{1.73_m2} (ref >59)
GFR calc non Af Amer: 58 mL/min/{1.73_m2} — ABNORMAL LOW (ref >59)
Glucose: 291 mg/dL — ABNORMAL HIGH (ref 65–99)
Potassium: 4.3 mmol/L (ref 3.5–5.2)
Sodium: 137 mmol/L (ref 134–144)

## 2019-08-26 MED ORDER — WARFARIN SODIUM 4 MG PO TABS: ORAL_TABLET | ORAL | 1 refills | Status: DC

## 2019-08-26 MED ORDER — WARFARIN SODIUM 5 MG PO TABS
5.0000 mg | ORAL_TABLET | Freq: Every day | ORAL | 1 refills | Status: DC
Start: 2019-08-26 — End: 2019-09-22

## 2019-08-26 NOTE — Patient Instructions (Signed)
Medication Instructions:   INCREASE Torsemide to 60 mg 2 times a day for 5 days then resume 40 mg 2 times a day   INCREASE Potassium to 20 meq daily for 5 days, then resume 10 meq daily  *If you need a refill on your cardiac medications before your next appointment, please call your pharmacy*  Lab Work: Your physician recommends that you return for lab work TODAY:   BMET If you have labs (blood work) drawn today and your tests are completely normal, you will receive your results only by: Marland Kitchen MyChart Message (if you have MyChart) OR . A paper copy in the mail If you have any lab test that is abnormal or we need to change your treatment, we will call you to review the results.  Testing/Procedures: NONE ordered at this time of appointment   Follow-Up: At [facility], you and your health needs are our priority.  As part of our continuing mission to provide you with exceptional heart care, we have created designated Provider Care Teams.  These Care Teams include your primary Cardiologist (physician) and Advanced Practice Providers (APPs -  Physician Assistants and Nurse Practitioners) who all work together to provide you with the care you need, when you need it.   Your next appointment:    1 month(s)  The format for your next appointment:   In Person  Provider:   Minus Breeding, MD or Almyra Deforest, PA-C  Other Instructions

## 2019-08-26 NOTE — Progress Notes (Signed)
Cardiology Office Note:    Date:  08/28/2019   ID:  Bobby Lindsey, DOB Feb 04, 1956, MRN [id number]  PCP:  Adin Hector, MD  [facility] Cardiologist:  Minus Breeding, MD  [place] Electrophysiologist:  None   Referring MD: Adin Hector, MD   Chief Complaint  Patient presents with  . Follow-up    shortness of breath    History of Present Illness:    Bobby Lindsey is a 64 y.o. male with a hx of CAD s/p CABG 2002 and DES to RCA, recurrent PE/DVT, HTN, HLD, DM, PAF and OSA. He was admitted in February 2015 with inferior STEMI and underwent emergent cardiac catheterization. This demonstrated patent SVG to OM1 and LIMA to LAD, occluded SVG to diagonal and SVG to PDA. EF 40% with inferior wall abnormality. Echo showed normal LV EF prior to discharge. Culprit was felt to be occluded native left circumflex which was treated with drug-eluting stent. Hospitalization complicated by transient atrial fibrillation and acute on chronic diastolic heart failure. He has a history of elevated CK and remain off of statin. After he was started on amiodarone for A. fib, he developed Mobitz II AV block, beta blocker was discontinued. He later self discontinued amiodarone thinking it was causing dyspnea. Plavix was discontinued in September 2016. Repeat echocardiogram obtained on 11/24/2016 showed EF 55 to 60%, severe LAE, mild focal basal hypertrophy of the septum, PA peak pressure 35 mmHg.  He had PCI of his left mid to distal anterior tibial artery in November 2018.    He later underwent left BKA at [facility].  He was admitted with worsening lower extremity edema and drainage in May 2020 despite increasing Lasix to 160 mg daily.  CTA of the chest was negative for PE.  Venous Doppler negative.  Beta-blocker discontinued given bradycardia.  He underwent IV diuresis and antibiotic treatment.  EKG shows he is in atrial flutter and eventually underwent cardioversion on 06/19/2018.  He  was discharged on 80 mg daily of Lasix.  At discharge weight was 251 pounds. Zio patch after discharge demonstrated recurrent A. fib with slow ventricular rate.  He also had a pauses during sleep was maximal pause of 3.8 seconds.  Patient was readmitted to the hospital in January 2021 with acute on chronic diastolic heart failure and recurrent atrial flutter.  He underwent another cardioversion.  He was last seen by Dr. Percival Spanish April 2021, he his weight was up slightly, therefore he was instructed to take extra 20 mg of Lasix for 2 days before going back to the previous dose.  Patient presents today for follow-up.  For the past few weeks, he has been noticing increasing dyspnea on exertion.  On exam, he has increased right lower extremity edema.  He has a history of left BKA.  On lung exam, he has markedly diminished breath sounds in the left base concerning for pleural effusion.  I think he is volume overloaded and recommended increase the torsemide to 60 mg twice daily for 5 days before going back to 40 mg twice daily thereafter.  I will also increase his potassium supplement from the 10 meq daily to 20 mEq daily for 5 days before going back to the previous dose.  He will need a basic metabolic panel today.  I plan to see the patient back in 1 month.  He plans to go to the beach from July 24 until July 30, if his shortness of breath improved  I think he may go, however if his shortness of breath continued to worsen, I would recommend to delay the trip.  Past Medical History:  Diagnosis Date  . Anginal pain ([place])    last pm  . Atrial fibrillation ([place])    a. Transient during 03/2013 admission.  . Bradycardia    a. Bradycardia/pauses/possible Mobitz II during 03/2013 adm. Not on BB due to this.  Marland Kitchen CAD (coronary artery disease)    a. s/p CABG 2002. b. Hx Cypher stent to the RCA. c. Inf-lat STEMI 03/2013:  LHC (04/05/13):  mLAD occluded, pD1 90, apical br of Dx occluded, CFX occluded, pOM1 90-95, RCA stents  patent, diff RCA 30, S-Dx occluded, S-PDA occluded, S-OM1 40-50, L-LAD patent, EF 40% with inf HK.  PCI:  Promus (2.5 x 28) DES to mid to dist CFX.  Marland Kitchen Charcot's joint of knee   . COPD (chronic obstructive pulmonary disease) ([place])   . Deep venous thrombosis (HCC)    right lower extremity  . Diabetes mellitus    a. A1C 10.7 in 03/2013.  . Diastolic CHF ([place])    a. EF 40% by cath, 55-60% during 03/2013 adm, required IV diuresis.  . DVT, lower extremity, recurrent ([place])    a. Hx recurrent DVT per record.  . Dyslipidemia   . Elevated CK    a. Pt has refused rheum workup in the past.  . GERD (gastroesophageal reflux disease)   . History of epidermal inclusion cyst excision 07/09/2018  . History of hiatal hernia   . HTN (hypertension)    x 15 years  . Hx of cardiovascular stress test    a. Lexiscan Myoview (03/2010):  diaph atten vs inf scar, no ischemia, EF 47%; Low Risk.  Marland Kitchen Hx of echocardiogram    a. Echo (04/08/13):  Mild LVH, EF 55-60%, restrictive physiology, severe LAE, mild reduced RVSF, mild RAE.  . Leg pain    ABI 6/16:  R 1.2, L 1.1 - normal  . Myocardial infarction ([place])   . Obesity   . Peripheral neuropathy   . Pulmonary embolism ([place])   . Sleep apnea     Past Surgical History:  Procedure Laterality Date  . CARDIOVERSION N/A 06/19/2018   Procedure: CARDIOVERSION;  Surgeon: Acie Fredrickson Wonda Cheng, MD;  Location: [facility] ENDOSCOPY;  Service: Cardiovascular;  Laterality: N/A;  . CARDIOVERSION N/A 02/21/2019   Procedure: CARDIOVERSION;  Surgeon: Fay Records, MD;  Location: [place];  Service: Cardiovascular;  Laterality: N/A;  . CORONARY ANGIOPLASTY WITH STENT PLACEMENT    . CORONARY ARTERY BYPASS GRAFT     4 time since 2002  . LEFT HEART CATHETERIZATION WITH CORONARY/GRAFT ANGIOGRAM  04/05/2013   Procedure: LEFT HEART CATHETERIZATION WITH Beatrix Fetters;  Surgeon: Peter M Martinique, MD;  Location: [facility] CATH LAB;  Service: Cardiovascular;;  . left knee surgery    . LOWER EXTREMITY  ANGIOGRAPHY Left 12/25/2016   Procedure: LOWER EXTREMITY ANGIOGRAPHY;  Surgeon: Algernon Huxley, MD;  Location: [place] CV LAB;  Service: Cardiovascular;  Laterality: Left;  . PERCUTANEOUS CORONARY STENT INTERVENTION (PCI-S)  04/05/2013   Procedure: PERCUTANEOUS CORONARY STENT INTERVENTION (PCI-S);  Surgeon: Peter M Martinique, MD;  Location: [facility] CATH LAB;  Service: Cardiovascular;;  DES to native Mid cx  . VASCULAR SURGERY      Current Medications: Current Meds  Medication Sig  . acetaminophen (TYLENOL) 500 MG tablet Take 1,000 mg every 8 (eight) hours as needed by mouth for mild pain or headache.   Marland Kitchen  atorvastatin (LIPITOR) 20 MG tablet TAKE 1 TABLET BY MOUTH EVERY DAY  . Blood Glucose Monitoring Suppl (FIFTY50 GLUCOSE METER 2.0) w/Device KIT Use as directed.  . enoxaparin (LOVENOX) 120 MG/0.8ML injection Inject 0.8 mLs (120 mg total) into the skin every 12 (twelve) hours.  . fluticasone (FLONASE) 50 MCG/ACT nasal spray Place 2 sprays into both nostrils daily as needed for allergies or rhinitis.   Marland Kitchen gabapentin (NEURONTIN) 300 MG capsule Take 600 mg by mouth at bedtime as needed (pain in foot and ankle).   . Insulin Glargine, 1 Unit Dial, (TOUJEO SOLOSTAR) 300 UNIT/ML SOPN Inject 70 Units into the skin every morning.   . insulin lispro (HUMALOG KWIKPEN) 100 UNIT/ML injection Inject 36 Units into the skin 3 (three) times daily before meals.   . Insulin Syringe-Needle U-100 31G X 5/16" 0.3 ML MISC use as directed  . isosorbide mononitrate (IMDUR) 30 MG 24 hr tablet TAKE 1 TABLET BY MOUTH EVERY DAY  . losartan (COZAAR) 25 MG tablet TAKE 1 TABLET BY MOUTH EVERY DAY  . metFORMIN (GLUCOPHAGE-XR) 500 MG 24 hr tablet Take 500 mg by mouth daily with breakfast.  . nitroGLYCERIN (NITROSTAT) 0.4 MG SL tablet Place 1 tablet (0.4 mg total) under the tongue every 5 (five) minutes as needed for chest pain. NEEDS APPOINTMENT  . pantoprazole (PROTONIX) 40 MG tablet TAKE 1 TABLET BY MOUTH EVERY DAY IN THE EVENING    . potassium chloride (K-DUR) 10 MEQ tablet Take 10 mEq by mouth every morning.  . torsemide (DEMADEX) 20 MG tablet Take 2 tablets (40 mg total) by mouth 2 (two) times daily.  . [DISCONTINUED] warfarin (COUMADIN) 10 MG tablet Take 1 tablet (10 mg total) by mouth one time only at 6 PM.     Allergies:   Simvastatin   Social History   Socioeconomic History  . Marital status: Married    Spouse name: Not on file  . Number of children: Not on file  . Years of education: Not on file  . Highest education level: Not on file  Occupational History  . Occupation: CONSTRUCTION    Employer: [employer]    Comment: Clinical biochemist  Tobacco Use  . Smoking status: Never Smoker  . Smokeless tobacco: Never Used  Vaping Use  . Vaping Use: Never used  Substance and Sexual Activity  . Alcohol use: Yes    Comment: rare  . Drug use: No  . Sexual activity: Yes  Other Topics Concern  . 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    Social Determinants of Health   Financial Resource Strain:   . Difficulty of Paying Living Expenses:   Food Insecurity:   . Worried About Charity fundraiser in the Last Year:   . Arboriculturist in the Last Year:   Transportation Needs:   . Film/video editor (Medical):   Marland Kitchen Lack of Transportation (Non-Medical):   Physical Activity:   . Days of Exercise per Week:   . Minutes of Exercise per Session:   Stress:   . Feeling of Stress :   Social Connections:   . Frequency of Communication with Friends and Family:   . Frequency of Social Gatherings with Friends and Family:   . Attends Religious Services:   . Active Member of Clubs or Organizations:   . Attends Archivist Meetings:   Marland Kitchen Marital Status:      Family History: The  patient's family history includes Cancer in his father; Heart disease in his mother; Hypertension in his mother and sister. There is no history of Heart  attack or Stroke.  ROS:   Please see the history of present illness.     All other systems reviewed and are negative.  EKGs/Labs/Other Studies Reviewed:    The following studies were reviewed today:  Echo 02/20/2019 1. Left ventricular ejection fraction, by visual estimation, is 55 to  60%. The left ventricle has normal function. There is no left ventricular  hypertrophy.  2. Left ventricular diastolic function could not be evaluated.  3. Mildly dilated left ventricular internal cavity size.  4. The left ventricle has no regional wall motion abnormalities.  5. Global right ventricle has normal systolic function.The right  ventricular size is normal. No increase in right ventricular wall  thickness.  6. Left atrial size was mildly dilated.  7. Right atrial size was moderately dilated.  8. The pericardium was not well visualized.  9. Mild mitral annular calcification.  10. The mitral valve is normal in structure. Trivial mitral valve  regurgitation.  11. The tricuspid valve is normal in structure.  12. The aortic valve has an indeterminant number of cusps. Aortic valve  regurgitation is not visualized. No evidence of aortic valve sclerosis or  stenosis.  13. The pulmonic valve was grossly normal. Pulmonic valve regurgitation is  not visualized.  14. Severely elevated pulmonary artery systolic pressure.  15. Patient appears to be in rate controlled atrial flutter throughout  study. Study ended early due to patient request.  16. The interatrial septum was not assessed.    EKG:  EKG is not ordered today.    Recent Labs: 02/20/2019: B Natriuretic Peptide 259.4 02/21/2019: ALT 27 02/22/2019: Hemoglobin 14.3; Platelets 159 08/26/2019: BUN 33; Creatinine, Ser 1.30; Potassium 4.3; Sodium 137  Recent Lipid Panel    Component Value Date/Time   CHOL 123 04/18/2018 0833   TRIG 117 04/18/2018 0833   HDL 37 (L) 04/18/2018 0833   CHOLHDL 3.3 04/18/2018 0833   CHOLHDL 3.6 CALC  03/11/2008 0930   VLDL 21 03/11/2008 0930   LDLCALC 63 04/18/2018 0833    Physical Exam:    VS:  BP 128/72   Pulse 70   Ht 5' 6"  (1.676 m)   Wt 262 lb (118.8 kg)   SpO2 90%   BMI 42.29 kg/m     Wt Readings from Last 3 Encounters:  08/26/19 262 lb (118.8 kg)  06/06/19 263 lb (119.3 kg)  03/10/19 260 lb 12.8 oz (118.3 kg)     GEN:  Well nourished, well developed in no acute distress HEENT: Normal NECK: No JVD; No carotid bruits LYMPHATICS: No lymphadenopathy CARDIAC: RRR, no murmurs, rubs, gallops RESPIRATORY:  Clear to auscultation without rales, wheezing or rhonchi  ABDOMEN: Soft, non-tender, non-distended MUSCULOSKELETAL:  No edema; No deformity  SKIN: Warm and dry NEUROLOGIC:  Alert and oriented x 3 PSYCHIATRIC:  Normal affect   ASSESSMENT:    1. Acute on chronic diastolic HF (heart failure) ([place])   2. Medication management   3. Coronary artery disease involving coronary bypass graft of native heart without angina pectoris   4. Essential hypertension   5. Hyperlipidemia, unspecified hyperlipidemia type   6. Controlled type 2 diabetes mellitus without complication, without long-term current use of insulin ([place])   7. PAF (paroxysmal atrial fibrillation) ([place])   8. History of deep vein thrombosis (DVT) of lower extremity   9. Hx of BKA, left (  [place])    PLAN:    In order of problems listed above:  1. Acute on chronic diastolic heart failure:   -Patient appears to be volume overloaded, will increase torsemide to 60 mg twice daily for 5 days before going down to the previous dose of 40 mg twice daily.  During the same time, I will also double up on the potassium supplement for 5 days before going back to the previous dose.  2. CAD s/p CABG: Denies any recent chest discomfort  3. Hypertension: Blood pressure stable  4. Hyperlipidemia: Continue Lipitor  5. DM2: On insulin, managed by primary care provider  6. PAF: Continue warfarin  7. History of DVT and PE:  Lifelong anticoagulation therapy with Coumadin  8. History of left BKA: Leg prosthesis present to help with walking.   Medication Adjustments/Labs and Tests Ordered: Current medicines are reviewed at length with the patient today.  Concerns regarding medicines are outlined above.  Orders Placed This Encounter  Procedures  . Basic metabolic panel   No orders of the defined types were placed in this encounter.   Patient Instructions  Medication Instructions:   INCREASE Torsemide to 60 mg 2 times a day for 5 days then resume 40 mg 2 times a day   INCREASE Potassium to 20 meq daily for 5 days, then resume 10 meq daily  *If you need a refill on your cardiac medications before your next appointment, please call your pharmacy*  Lab Work: Your physician recommends that you return for lab work TODAY:   BMET If you have labs (blood work) drawn today and your tests are completely normal, you will receive your results only by: Marland Kitchen MyChart Message (if you have MyChart) OR . A paper copy in the mail If you have any lab test that is abnormal or we need to change your treatment, we will call you to review the results.  Testing/Procedures: NONE ordered at this time of appointment   Follow-Up: At [facility], you and your health needs are our priority.  As part of our continuing mission to provide you with exceptional heart care, we have created designated Provider Care Teams.  These Care Teams include your primary Cardiologist (physician) and Advanced Practice Providers (APPs -  Physician Assistants and Nurse Practitioners) who all work together to provide you with the care you need, when you need it.   Your next appointment:    1 month(s)  The format for your next appointment:   In Person  Provider:   Minus Breeding, MD or Almyra Deforest, PA-C  Other Instructions      Signed, Almyra Deforest, [place]  08/28/2019 11:33 PM    [place]

## 2019-08-26 NOTE — Telephone Encounter (Signed)
Pt taking warfarin 5 mg daily with 9 mg MF.  PCP changed rx in January to 10 mg tablet.  Will d/c 10 mg tablet and refill both 4 and 5 mg tablets as previously ordered

## 2019-09-03 ENCOUNTER — Ambulatory Visit (INDEPENDENT_AMBULATORY_CARE_PROVIDER_SITE_OTHER): Payer: Managed Care, Other (non HMO)

## 2019-09-03 ENCOUNTER — Other Ambulatory Visit: Payer: Self-pay

## 2019-09-03 DIAGNOSIS — I480 Paroxysmal atrial fibrillation: Secondary | ICD-10-CM

## 2019-09-03 DIAGNOSIS — I2119 ST elevation (STEMI) myocardial infarction involving other coronary artery of inferior wall: Secondary | ICD-10-CM

## 2019-09-03 DIAGNOSIS — Z7901 Long term (current) use of anticoagulants: Secondary | ICD-10-CM

## 2019-09-03 DIAGNOSIS — Z86718 Personal history of other venous thrombosis and embolism: Secondary | ICD-10-CM | POA: Diagnosis not present

## 2019-09-03 DIAGNOSIS — Z86711 Personal history of pulmonary embolism: Secondary | ICD-10-CM

## 2019-09-03 DIAGNOSIS — Z5181 Encounter for therapeutic drug level monitoring: Secondary | ICD-10-CM | POA: Diagnosis not present

## 2019-09-03 LAB — POCT INR: INR: 2 (ref 2.0–3.0)

## 2019-09-03 NOTE — Patient Instructions (Signed)
-   continue dosage of 9 mg every day EXCEPT 5 mg on [place]. - recheck in 3 weeks.

## 2019-09-15 ENCOUNTER — Other Ambulatory Visit: Payer: Self-pay | Admitting: Cardiology

## 2019-09-20 ENCOUNTER — Other Ambulatory Visit: Payer: Self-pay | Admitting: Cardiology

## 2019-09-29 DIAGNOSIS — M7989 Other specified soft tissue disorders: Secondary | ICD-10-CM | POA: Insufficient documentation

## 2019-09-29 NOTE — Progress Notes (Signed)
Cardiology Office Note   Date:  09/30/2019   ID:  Bobby Lindsey, DOB January 18, 1956, MRN [id number]  PCP:  Adin Hector, MD  Cardiologist:   Minus Breeding, MD   No chief complaint on file.    History of Present Illness: Bobby Lindsey is a 64 y.o. male who presents for follow up of acute on chronic diastolic HF.  In May 2020 he was in the hospital for this.  He presented with volume overload and was in atrial flutter.  Echo was done and his EF was 60 - 65%.  There were no significant valvular abnormalities.    He was also treated for cellulitis.  He did have cardioversion.  Of note he had a slow ventricular rate.  He had a ZIO patch and he had atrial fib with slow ventricular rates.  He had pauses during sleep with a max pause of 3.8 sec .  He was to have a sleep study but this did not get approved.  He was in the hospital again in Jan 2021 with SOB and acute on chronic diastolic HF.   He had atrial flutter and had cardioversion.    At the last visit he had increased volume and was treated with a short course of increased diuretic.  He has had no acute complaints.  He does have a little increased lower extremity swelling today and his weight is not changed dramatically from previous but he does not feel like he is more short of breath.  Has some baseline dyspnea.  He has a prosthetic leg and severe back pain and that limits his activities.  He gets dyspneic with moderate exertion but this is not changed.  Is not having any new PND or orthopnea.  Is not having any new chest pain.  He has abdominal pain.  This is worse when he is bending over.  Is worse when he is eating something.  This is keeping him awake at night.   Past Medical History:  Diagnosis Date  . Anginal pain ([place])    last pm  . Atrial fibrillation ([place])    a. Transient during 03/2013 admission.  . Bradycardia    a. Bradycardia/pauses/possible Mobitz II during 03/2013 adm. Not on BB due to this.  Marland Kitchen CAD (coronary  artery disease)    a. s/p CABG 2002. b. Hx Cypher stent to the RCA. c. Inf-lat STEMI 03/2013:  LHC (04/05/13):  mLAD occluded, pD1 90, apical br of Dx occluded, CFX occluded, pOM1 90-95, RCA stents patent, diff RCA 30, S-Dx occluded, S-PDA occluded, S-OM1 40-50, L-LAD patent, EF 40% with inf HK.  PCI:  Promus (2.5 x 28) DES to mid to dist CFX.  Marland Kitchen Charcot's joint of knee   . COPD (chronic obstructive pulmonary disease) ([place])   . Deep venous thrombosis ([facility])    right lower extremity  . Diabetes mellitus    a. A1C 10.7 in 03/2013.  . Diastolic CHF ([place])    a. EF 40% by cath, 55-60% during 03/2013 adm, required IV diuresis.  . DVT, lower extremity, recurrent ([place])    a. Hx recurrent DVT per record.  . Dyslipidemia   . Elevated CK    a. Pt has refused rheum workup in the past.  . GERD (gastroesophageal reflux disease)   . History of epidermal inclusion cyst excision 07/09/2018  . History of hiatal hernia   . HTN (hypertension)    x 15 years  . Hx of cardiovascular stress  test    a. Lexiscan Myoview (03/2010):  diaph atten vs inf scar, no ischemia, EF 47%; Low Risk.  Marland Kitchen Hx of echocardiogram    a. Echo (04/08/13):  Mild LVH, EF 55-60%, restrictive physiology, severe LAE, mild reduced RVSF, mild RAE.  . Leg pain    ABI 6/16:  R 1.2, L 1.1 - normal  . Myocardial infarction ([place])   . Obesity   . Peripheral neuropathy   . Pulmonary embolism ([place])   . Sleep apnea     Past Surgical History:  Procedure Laterality Date  . CARDIOVERSION N/A 06/19/2018   Procedure: CARDIOVERSION;  Surgeon: Acie Fredrickson Wonda Cheng, MD;  Location: [facility] ENDOSCOPY;  Service: Cardiovascular;  Laterality: N/A;  . CARDIOVERSION N/A 02/21/2019   Procedure: CARDIOVERSION;  Surgeon: Fay Records, MD;  Location: [facility];  Service: Cardiovascular;  Laterality: N/A;  . CORONARY ANGIOPLASTY WITH STENT PLACEMENT    . CORONARY ARTERY BYPASS GRAFT     4 time since 2002  . LEFT HEART CATHETERIZATION WITH CORONARY/GRAFT ANGIOGRAM  04/05/2013    Procedure: LEFT HEART CATHETERIZATION WITH Beatrix Fetters;  Surgeon: Peter M Martinique, MD;  Location: [facility] CATH LAB;  Service: Cardiovascular;;  . left knee surgery    . LOWER EXTREMITY ANGIOGRAPHY Left 12/25/2016   Procedure: LOWER EXTREMITY ANGIOGRAPHY;  Surgeon: Algernon Huxley, MD;  Location: [facility] CV LAB;  Service: Cardiovascular;  Laterality: Left;  . PERCUTANEOUS CORONARY STENT INTERVENTION (PCI-S)  04/05/2013   Procedure: PERCUTANEOUS CORONARY STENT INTERVENTION (PCI-S);  Surgeon: Peter M Martinique, MD;  [facility] C[facility] LAB;  Service: Cardiovascular;;  DES to native Mid cx  . VASCULAR SURGERY       Current Outpatient Medications  Medication Sig Dispense Refill  . acetaminophen (TYLENOL) 500 MG tablet Take 1,000 mg every 8 (eight) hours as needed by mouth for mild pain or headache.     Marland Kitchen atorvastatin (LIPITOR) 20 MG tablet TAKE 1 TABLET BY MOUTH EVERY DAY 90 tablet 2  . Blood Glucose Monitoring Suppl (FIFTY50 GLUCOSE METER 2.0) w/Device KIT Use as directed.    . fluticasone (FLONASE) 50 MCG/ACT nasal spray Place 2 sprays into both nostrils daily as needed for allergies or rhinitis.     Marland Kitchen gabapentin (NEURONTIN) 300 MG capsule Take 600 mg by mouth at bedtime as needed (pain in foot and ankle).     . Insulin Glargine, 1 Unit Dial, (TOUJEO SOLOSTAR) 300 UNIT/ML SOPN Inject 70 Units into the skin every morning.     . insulin lispro (HUMALOG KWIKPEN) 100 UNIT/ML injection Inject 30 Units into the skin 3 (three) times daily before meals.     . Insulin Syringe-Needle U-100 31G X 5/16" 0.3 ML MISC use as directed    . isosorbide mononitrate (IMDUR) 30 MG 24 hr tablet TAKE 1 TABLET BY MOUTH EVERY DAY 90 tablet 3  . losartan (COZAAR) 25 MG tablet TAKE 1 TABLET BY MOUTH EVERY DAY 90 tablet 3  . metFORMIN (GLUCOPHAGE-XR) 500 MG 24 hr tablet Take 500 mg by mouth daily with breakfast.    . nitroGLYCERIN (NITROSTAT) 0.4 MG SL tablet Place 1 tablet (0.4 mg total) under the tongue every 5  (five) minutes as needed for chest pain. NEEDS APPOINTMENT 25 tablet 6  . pantoprazole (PROTONIX) 40 MG tablet TAKE 1 TABLET BY MOUTH EVERY DAY IN THE EVENING 90 tablet 3  . potassium chloride (K-DUR) 10 MEQ tablet Take 10 mEq by mouth every morning.    . torsemide (DEMADEX) 20 MG tablet  TAKE 2 TABLETS (40 MG TOTAL) BY MOUTH 2 (TWO) TIMES DAILY. 360 tablet 2  . warfarin (COUMADIN) 4 MG tablet Take 1 tablet (with 5 mg tablet) 2-3 times per week as directed 30 tablet 1  . warfarin (COUMADIN) 5 MG tablet TAKE 1 TABLET (5 MG TOTAL) BY MOUTH AS DIRECTED. TAKE AS DIRECTED BY THE COUMADIN CLINIC. 60 tablet 1   No current facility-administered medications for this visit.    Allergies:   Simvastatin    ROS:  Please see the history of present illness.   Otherwise, review of systems are positive for none .   All other systems are reviewed and negative.    PHYSICAL EXAM: VS:  BP (!) 162/54   Pulse 62   Ht 6' (1.829 m)   Wt 262 lb 9.6 oz (119.1 kg)   BMI 35.61 kg/m  , BMI Body mass index is 35.61 kg/m. GENERAL:  Well appearing NECK:  No jugular venous distention, waveform within normal limits, carotid upstroke brisk and symmetric, no bruits, no thyromegaly LUNGS:  Clear to auscultation bilaterally CHEST:  Unremarkable HEART:  PMI not displaced or sustained,S1 and S2 within normal limits, no S3, no S4, no clicks, no rubs, 2 of 6 apical systolic murmur radiating up uric outflow tract, no diastolic murmurs ABD:  Flat, positive bowel sounds normal in frequency in pitch, no bruits, no rebound, no guarding, no midline pulsatile mass, no hepatomegaly, no splenomegaly, obese EXT:  2 plus pulses throughout, moderate lower extremity right leg edema ,  no cyanosis no clubbing  EKG:  EKG is  ordered today. Sinus rhythm, rate 62, axis within normal limits, first-degree AV block, old inferior infarct.  Recent Labs: 02/20/2019: B Natriuretic Peptide 259.4 02/21/2019: ALT 27 02/22/2019: Hemoglobin 14.3; Platelets  159 08/26/2019: BUN 33; Creatinine, Ser 1.30; Potassium 4.3; Sodium 137    Lipid Panel    Component Value Date/Time   CHOL 123 04/18/2018 0833   TRIG 117 04/18/2018 0833   HDL 37 (L) 04/18/2018 0833   CHOLHDL 3.3 04/18/2018 0833   CHOLHDL 3.6 CALC 03/11/2008 0930   VLDL 21 03/11/2008 0930   LDLCALC 63 04/18/2018 0833      Wt Readings from Last 3 Encounters:  09/30/19 262 lb 9.6 oz (119.1 kg)  08/26/19 262 lb (118.8 kg)  06/06/19 263 lb (119.3 kg)    Other studies Reviewed: Additional studies/ records that were reviewed today include: Labs from [place] of the above records demonstrates:  See elsewhere   ASSESSMENT AND PLAN:  ACUTE ON CHRONIC DIASTOLIC HF:     He has a little excess volume today.  Him to increase his torsemide to 60 mg twice a day for 3 days.  I did check some blood work that was done within the last 2 days.  BUN was 31.  Creatinine is 1.3 and potassium 4.2.  He understands salt and fluid restriction.   CAD:   The patient has no new sypmtoms.  No further cardiovascular testing is indicated.  We will continue with aggressive risk reduction and meds as listed.  ABDOMINAL PAIN: I suggested he might want to start with GI or his primary provider to discuss this.  ZSW:FUXNA pressure is elevated but he did not take his medicines yet today.  No change in therapy.   HYPERLIPIDEMIA: LDL was 63.  No change in therapy.   DM:A1c was 9.6 which is up from 9.2 but down from his recent high of 11.7.  He is working with  his primary doctor. with his endocrinologist.  KPV:VZSMOLMBE would not cover CPAP.  ATRIAL FLUTTER:   He is maintaining sinus rhythm.   AKI: His creatinine was stable as above.   COVID EDUCATION:     He is hesitant to take vaccine we had a long discussion about this previously.   Current medicines are reviewed at length with the patient today.  The patient does not have concerns regarding medicines.  The following changes  have been made:  None  Labs/ tests ordered today include: See elsewhere  No orders of the defined types were placed in this encounter.    Disposition:   FU with Almyra Deforest in 2 months.  Signed, Minus Breeding, MD  09/30/2019 2:21 PM    [place]

## 2019-09-30 ENCOUNTER — Ambulatory Visit (INDEPENDENT_AMBULATORY_CARE_PROVIDER_SITE_OTHER): Payer: Managed Care, Other (non HMO) | Admitting: Cardiology

## 2019-09-30 ENCOUNTER — Encounter: Payer: Self-pay | Admitting: Cardiology

## 2019-09-30 ENCOUNTER — Other Ambulatory Visit: Payer: Self-pay

## 2019-09-30 ENCOUNTER — Ambulatory Visit (INDEPENDENT_AMBULATORY_CARE_PROVIDER_SITE_OTHER): Payer: Managed Care, Other (non HMO) | Admitting: Pharmacist

## 2019-09-30 VITALS — BP 162/54 | HR 62 | Ht 72.0 in | Wt 262.6 lb

## 2019-09-30 DIAGNOSIS — I480 Paroxysmal atrial fibrillation: Secondary | ICD-10-CM | POA: Diagnosis not present

## 2019-09-30 DIAGNOSIS — I5031 Acute diastolic (congestive) heart failure: Secondary | ICD-10-CM

## 2019-09-30 DIAGNOSIS — I2119 ST elevation (STEMI) myocardial infarction involving other coronary artery of inferior wall: Secondary | ICD-10-CM

## 2019-09-30 DIAGNOSIS — M7989 Other specified soft tissue disorders: Secondary | ICD-10-CM

## 2019-09-30 DIAGNOSIS — Z7189 Other specified counseling: Secondary | ICD-10-CM

## 2019-09-30 DIAGNOSIS — Z7901 Long term (current) use of anticoagulants: Secondary | ICD-10-CM

## 2019-09-30 DIAGNOSIS — Z86711 Personal history of pulmonary embolism: Secondary | ICD-10-CM

## 2019-09-30 DIAGNOSIS — I10 Essential (primary) hypertension: Secondary | ICD-10-CM

## 2019-09-30 DIAGNOSIS — R0602 Shortness of breath: Secondary | ICD-10-CM

## 2019-09-30 DIAGNOSIS — Z5181 Encounter for therapeutic drug level monitoring: Secondary | ICD-10-CM | POA: Diagnosis not present

## 2019-09-30 DIAGNOSIS — Z86718 Personal history of other venous thrombosis and embolism: Secondary | ICD-10-CM

## 2019-09-30 LAB — POCT INR: INR: 2.5 (ref 2.0–3.0)

## 2019-09-30 NOTE — Patient Instructions (Signed)
Medication Instructions:  Take 60mg  of Demadex (Torsemide) twice a day for 3 days and then decrease to 40mg  twice daily.  *If you need a refill on your cardiac medications before your next appointment, please call your pharmacy*  Lab Work: None ordered this visit  Testing/Procedures: None ordered this visit  Follow-Up: At [facility], you and your health needs are our priority.  As part of our continuing mission to provide you with exceptional heart care, we have created designated Provider Care Teams.  These Care Teams include your primary Cardiologist (physician) and Advanced Practice Providers (APPs -  Physician Assistants and Nurse Practitioners) who all work together to provide you with the care you need, when you need it.  Your next appointment:   Monday 10/18 at 2:45pm  The format for your next appointment:   In Person  Provider:   Almyra Deforest, [place]

## 2019-09-30 NOTE — Patient Instructions (Signed)
-   continue dosage of 9 mg every day EXCEPT 5 mg on [place]. - recheck in 3 weeks.

## 2019-10-22 ENCOUNTER — Ambulatory Visit (INDEPENDENT_AMBULATORY_CARE_PROVIDER_SITE_OTHER): Payer: Managed Care, Other (non HMO)

## 2019-10-22 ENCOUNTER — Other Ambulatory Visit: Payer: Self-pay

## 2019-10-22 DIAGNOSIS — Z7901 Long term (current) use of anticoagulants: Secondary | ICD-10-CM

## 2019-10-22 DIAGNOSIS — I2119 ST elevation (STEMI) myocardial infarction involving other coronary artery of inferior wall: Secondary | ICD-10-CM

## 2019-10-22 DIAGNOSIS — Z86718 Personal history of other venous thrombosis and embolism: Secondary | ICD-10-CM | POA: Diagnosis not present

## 2019-10-22 DIAGNOSIS — I480 Paroxysmal atrial fibrillation: Secondary | ICD-10-CM

## 2019-10-22 DIAGNOSIS — Z5181 Encounter for therapeutic drug level monitoring: Secondary | ICD-10-CM | POA: Diagnosis not present

## 2019-10-22 DIAGNOSIS — Z86711 Personal history of pulmonary embolism: Secondary | ICD-10-CM

## 2019-10-22 LAB — POCT INR: INR: 2.5 (ref 2.0–3.0)

## 2019-10-22 NOTE — Patient Instructions (Signed)
-   continue dosage of 9 mg every day EXCEPT 5 mg on [place]. - recheck in 4 weeks

## 2019-11-05 ENCOUNTER — Ambulatory Visit: Payer: Managed Care, Other (non HMO) | Admitting: Dermatology

## 2019-11-19 ENCOUNTER — Other Ambulatory Visit: Payer: Self-pay

## 2019-11-19 ENCOUNTER — Ambulatory Visit (INDEPENDENT_AMBULATORY_CARE_PROVIDER_SITE_OTHER): Payer: Managed Care, Other (non HMO)

## 2019-11-19 DIAGNOSIS — Z86718 Personal history of other venous thrombosis and embolism: Secondary | ICD-10-CM

## 2019-11-19 DIAGNOSIS — I2119 ST elevation (STEMI) myocardial infarction involving other coronary artery of inferior wall: Secondary | ICD-10-CM

## 2019-11-19 DIAGNOSIS — Z86711 Personal history of pulmonary embolism: Secondary | ICD-10-CM

## 2019-11-19 DIAGNOSIS — Z5181 Encounter for therapeutic drug level monitoring: Secondary | ICD-10-CM | POA: Diagnosis not present

## 2019-11-19 DIAGNOSIS — I480 Paroxysmal atrial fibrillation: Secondary | ICD-10-CM

## 2019-11-19 DIAGNOSIS — Z7901 Long term (current) use of anticoagulants: Secondary | ICD-10-CM

## 2019-11-19 LAB — POCT INR: INR: 2.6 (ref 2.0–3.0)

## 2019-11-19 NOTE — Patient Instructions (Signed)
-   continue dosage of 9 mg every day EXCEPT 5 mg on [place]. - recheck in 5 weeks

## 2019-12-01 ENCOUNTER — Other Ambulatory Visit: Payer: Self-pay

## 2019-12-01 ENCOUNTER — Ambulatory Visit: Payer: Medicare HMO | Admitting: Physician Assistant

## 2019-12-01 ENCOUNTER — Encounter: Payer: Self-pay | Admitting: Physician Assistant

## 2019-12-01 VITALS — BP 124/67 | HR 73 | Ht 72.0 in | Wt 263.0 lb

## 2019-12-01 DIAGNOSIS — I480 Paroxysmal atrial fibrillation: Secondary | ICD-10-CM | POA: Diagnosis not present

## 2019-12-01 DIAGNOSIS — Z79899 Other long term (current) drug therapy: Secondary | ICD-10-CM | POA: Diagnosis not present

## 2019-12-01 DIAGNOSIS — I25810 Atherosclerosis of coronary artery bypass graft(s) without angina pectoris: Secondary | ICD-10-CM | POA: Diagnosis not present

## 2019-12-01 DIAGNOSIS — I5033 Acute on chronic diastolic (congestive) heart failure: Secondary | ICD-10-CM

## 2019-12-01 DIAGNOSIS — R42 Dizziness and giddiness: Secondary | ICD-10-CM | POA: Diagnosis not present

## 2019-12-01 DIAGNOSIS — E785 Hyperlipidemia, unspecified: Secondary | ICD-10-CM

## 2019-12-01 DIAGNOSIS — I10 Essential (primary) hypertension: Secondary | ICD-10-CM | POA: Diagnosis not present

## 2019-12-01 DIAGNOSIS — E119 Type 2 diabetes mellitus without complications: Secondary | ICD-10-CM

## 2019-12-01 MED ORDER — NITROGLYCERIN 0.4 MG SL SUBL: 0.4000 mg | SUBLINGUAL_TABLET | SUBLINGUAL | 2 refills | Status: AC | PRN

## 2019-12-01 MED ORDER — POTASSIUM CHLORIDE CRYS ER 10 MEQ PO TBCR: 10.0000 meq | EXTENDED_RELEASE_TABLET | Freq: Every morning | ORAL | 3 refills | Status: DC

## 2019-12-01 MED ORDER — TORSEMIDE 20 MG PO TABS
ORAL_TABLET | ORAL | 3 refills | Status: DC
Start: 2019-12-01 — End: 2020-01-22

## 2019-12-01 NOTE — Progress Notes (Signed)
Cardiology Office Note:    Date:  12/03/2019   ID:  Bobby Lindsey, DOB [birth year], MRN [id number]  PCP:  Adin Hector, MD  [facility] Cardiologist:  Minus Breeding, MD  [place] Electrophysiologist:  None   Referring MD: Adin Hector, MD   Chief Complaint  Patient presents with  . Follow-up    seen for Dr. Percival Spanish    History of Present Illness:    Bobby Lindsey is a 64 y.o. male with a hx of CAD s/p CABG 2002 and DES to RCA, recurrent PE/DVT, HTN, HLD, DM, PAFand OSA. He was admitted in February 2015 with inferior STEMI and underwent emergent cardiac catheterization. This demonstrated patent SVG to OM1 and LIMA to LAD, occluded SVG to diagonal and SVG to PDA. EF 40% with inferior wall abnormality. Echo showed normal LVEF prior to discharge. Culprit was felt to be occluded native left circumflex which was treated with drug-eluting stent. Hospitalization complicated by transient atrial fibrillation and acute on chronic diastolic heart failure. He has a history of elevated CK and remain off of statin. After he was started on amiodarone for A. fib, he developed Mobitz II AV block, beta blocker was discontinued. He later self discontinued amiodarone thinking it was causing dyspnea. Plavix was discontinued in September 2016. Repeat echocardiogram obtained on 11/24/2016 showed EF 55 to 60%, severe LAE, mild focal basal hypertrophy of the septum, PA peak pressure 35 mmHg. He had PCI of his left mid to distal anterior tibial artery in November 2018.   He later underwent left BKA at [facility]. He was admitted with worsening lower extremity edema and drainage in May 2020 despite increasing Lasix to 160 mg daily. CTA of the chest was negative for PE. Venous Doppler negative. He underwent IV diuresis and antibiotic treatment. EKG shows he is in atrial flutter and eventually underwent cardioversion on 06/19/2018. He was discharged on 80 mg daily of Lasix.  At discharge weight was 251 pounds. Ziopatch after discharge demonstrated recurrent A. fib with slow ventricular rate. He also had a pauses during sleep was maximal pauseof 3.8 seconds.  Patient was readmitted to the hospital in January 2021 with acute on chronic diastolic heart failure and recurrent atrial flutter.  He underwent another cardioversion.    I last saw the patient in July 2021 at which time he appears to be volume overloaded.  I treated him with a short course of increased diuretic before going back to the previous dose.  He was seen by Dr. Percival Spanish again on 09/30/2019 who thought he was little volume overloaded as well and increase his torsemide to 60 mg twice a day for 3 days before going back to 40 mg twice a day.  He presents today for follow-up.  He appears to be volume overloaded again, his wife has been giving him 60 mg twice daily dosing of torsemide for the past 3 days.  I asked her to continue 60 mg twice daily for another 3 days.  After that, he can decrease torsemide to 40 mg twice a day Monday through Friday then 60 mg twice a day Saturday and Sunday.  On the day he takes 60 mg torsemide, he will also need to double up on his potassium supplement.   Past Medical History:  Diagnosis Date  . Anginal pain ([place])    last pm  . Atrial fibrillation ([place])    a. Transient during 03/2013 admission.  . Bradycardia  a. Bradycardia/pauses/possible Mobitz II during 03/2013 adm. Not on BB due to this.  Marland Kitchen CAD (coronary artery disease)    a. s/p CABG 2002. b. Hx Cypher stent to the RCA. c. Inf-lat STEMI 03/2013:  LHC (04/05/13):  mLAD occluded, pD1 90, apical br of Dx occluded, CFX occluded, pOM1 90-95, RCA stents patent, diff RCA 30, S-Dx occluded, S-PDA occluded, S-OM1 40-50, L-LAD patent, EF 40% with inf HK.  PCI:  Promus (2.5 x 28) DES to mid to dist CFX.  Marland Kitchen Charcot's joint of knee   . COPD (chronic obstructive pulmonary disease) ([place])   . Deep venous thrombosis (HCC)    right lower  extremity  . Diabetes mellitus    a. A1C 10.7 in 03/2013.  . Diastolic CHF ([place])    a. EF 40% by cath, 55-60% during 03/2013 adm, required IV diuresis.  . DVT, lower extremity, recurrent ([place])    a. Hx recurrent DVT per record.  . Dyslipidemia   . Elevated CK    a. Pt has refused rheum workup in the past.  . GERD (gastroesophageal reflux disease)   . History of epidermal inclusion cyst excision 07/09/2018  . History of hiatal hernia   . HTN (hypertension)    x 15 years  . Hx of cardiovascular stress test    a. Lexiscan Myoview (03/2010):  diaph atten vs inf scar, no ischemia, EF 47%; Low Risk.  Marland Kitchen Hx of echocardiogram    a. Echo (04/08/13):  Mild LVH, EF 55-60%, restrictive physiology, severe LAE, mild reduced RVSF, mild RAE.  . Leg pain    ABI 6/16:  R 1.2, L 1.1 - normal  . Myocardial infarction ([place])   . Obesity   . Peripheral neuropathy   . Pulmonary embolism ([place])   . Sleep apnea     Past Surgical History:  Procedure Laterality Date  . CARDIOVERSION N/A 06/19/2018   Procedure: CARDIOVERSION;  Surgeon: Acie Fredrickson Wonda Cheng, MD;  Location: [facility] ENDOSCOPY;  Service: Cardiovascular;  Laterality: N/A;  . CARDIOVERSION N/A 02/21/2019   Procedure: CARDIOVERSION;  Surgeon: Fay Records, MD;  Location: [place];  Service: Cardiovascular;  Laterality: N/A;  . CORONARY ANGIOPLASTY WITH STENT PLACEMENT    . CORONARY ARTERY BYPASS GRAFT     4 time since 2002  . LEFT HEART CATHETERIZATION WITH CORONARY/GRAFT ANGIOGRAM  04/05/2013   Procedure: LEFT HEART CATHETERIZATION WITH Beatrix Fetters;  Surgeon: Peter M Martinique, MD;  Location: [facility] CATH LAB;  Service: Cardiovascular;;  . left knee surgery    . LOWER EXTREMITY ANGIOGRAPHY Left 12/25/2016   Procedure: LOWER EXTREMITY ANGIOGRAPHY;  Surgeon: Algernon Huxley, MD;  Location: [place] CV LAB;  Service: Cardiovascular;  Laterality: Left;  . PERCUTANEOUS CORONARY STENT INTERVENTION (PCI-S)  04/05/2013   Procedure: PERCUTANEOUS CORONARY  STENT INTERVENTION (PCI-S);  Surgeon: Peter M Martinique, MD;  Location: [facility] CATH LAB;  Service: Cardiovascular;;  DES to native Mid cx  . VASCULAR SURGERY      Current Medications: Current Meds  Medication Sig  . acetaminophen (TYLENOL) 500 MG tablet Take 1,000 mg every 8 (eight) hours as needed by mouth for mild pain or headache.   Marland Kitchen atorvastatin (LIPITOR) 20 MG tablet TAKE 1 TABLET BY MOUTH EVERY DAY  . Blood Glucose Monitoring Suppl (FIFTY50 GLUCOSE METER 2.0) w/Device KIT Use as directed.  . fluticasone (FLONASE) 50 MCG/ACT nasal spray Place 2 sprays into both nostrils daily as needed for allergies or rhinitis.   Marland Kitchen gabapentin (NEURONTIN) 300 MG capsule Take  600 mg by mouth at bedtime as needed (pain in foot and ankle).   . Insulin Glargine, 1 Unit Dial, (TOUJEO SOLOSTAR) 300 UNIT/ML SOPN Inject 70 Units into the skin every morning.   . insulin lispro (HUMALOG KWIKPEN) 100 UNIT/ML injection Inject 30 Units into the skin 3 (three) times daily before meals.   . Insulin Syringe-Needle U-100 31G X 5/16" 0.3 ML MISC use as directed  . isosorbide mononitrate (IMDUR) 30 MG 24 hr tablet TAKE 1 TABLET BY MOUTH EVERY DAY  . losartan (COZAAR) 25 MG tablet TAKE 1 TABLET BY MOUTH EVERY DAY  . metFORMIN (GLUCOPHAGE-XR) 500 MG 24 hr tablet Take 500 mg by mouth daily with breakfast.  . pantoprazole (PROTONIX) 40 MG tablet TAKE 1 TABLET BY MOUTH EVERY DAY IN THE EVENING  . potassium chloride (KLOR-CON) 10 MEQ tablet Take 1 tablet (10 mEq total) by mouth every morning.  . torsemide (DEMADEX) 20 MG tablet Take 40 mg BID PO M-F and Take 60 mg BID PO Saturdays and Sundays  . warfarin (COUMADIN) 4 MG tablet Take 1 tablet (with 5 mg tablet) 2-3 times per week as directed  . warfarin (COUMADIN) 5 MG tablet TAKE 1 TABLET (5 MG TOTAL) BY MOUTH AS DIRECTED. TAKE AS DIRECTED BY THE COUMADIN CLINIC.  . [DISCONTINUED] nitroGLYCERIN (NITROSTAT) 0.4 MG SL tablet Place 1 tablet (0.4 mg total) under the tongue every 5 (five)  minutes as needed for chest pain. NEEDS APPOINTMENT  . [DISCONTINUED] potassium chloride (K-DUR) 10 MEQ tablet Take 10 mEq by mouth every morning.  . [DISCONTINUED] torsemide (DEMADEX) 20 MG tablet TAKE 2 TABLETS (40 MG TOTAL) BY MOUTH 2 (TWO) TIMES DAILY.     Allergies:   Simvastatin   Social History   Socioeconomic History  . Marital status: Married    Spouse name: Not on file  . Number of children: Not on file  . Years of education: Not on file  . Highest education level: Not on file  Occupational History  . Occupation: CONSTRUCTION    Employer: [employer]    Comment: Clinical biochemist  Tobacco Use  . Smoking status: Never Smoker  . Smokeless tobacco: Never Used  Vaping Use  . Vaping Use: Never used  Substance and Sexual Activity  . Alcohol use: Yes    Comment: rare  . Drug use: No  . Sexual activity: Yes  Other Topics Concern  . 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    Social Determinants of Health   Financial Resource Strain:   . Difficulty of Paying Living Expenses: Not on file  Food Insecurity:   . Worried About Charity fundraiser in the Last Year: Not on file  . Ran Out of Food in the Last Year: Not on file  Transportation Needs:   . Lack of Transportation (Medical): Not on file  . Lack of Transportation (Non-Medical): Not on file  Physical Activity:   . Days of Exercise per Week: Not on file  . Minutes of Exercise per Session: Not on file  Stress:   . Feeling of Stress : Not on file  Social Connections:   . Frequency of Communication with Friends and Family: Not on file  . Frequency of Social Gatherings with Friends and Family: Not on file  . Attends Religious Services: Not on file  . Active Member of Clubs or Organizations: Not on file  . Attends Archivist Meetings: Not on  file  . Marital Status: Not on file     Family History: The patient's family history includes  Cancer in his father; Heart disease in his mother; Hypertension in his mother and sister. There is no history of Heart attack or Stroke.  ROS:   Please see the history of present illness.     All other systems reviewed and are negative.  EKGs/Labs/Other Studies Reviewed:    The following studies were reviewed today:  Echo 02/20/2019 1. Left ventricular ejection fraction, by visual estimation, is 55 to  60%. The left ventricle has normal function. There is no left ventricular  hypertrophy.  2. Left ventricular diastolic function could not be evaluated.  3. Mildly dilated left ventricular internal cavity size.  4. The left ventricle has no regional wall motion abnormalities.  5. Global right ventricle has normal systolic function.The right  ventricular size is normal. No increase in right ventricular wall  thickness.  6. Left atrial size was mildly dilated.  7. Right atrial size was moderately dilated.  8. The pericardium was not well visualized.  9. Mild mitral annular calcification.  10. The mitral valve is normal in structure. Trivial mitral valve  regurgitation.  11. The tricuspid valve is normal in structure.  12. The aortic valve has an indeterminant number of cusps. Aortic valve  regurgitation is not visualized. No evidence of aortic valve sclerosis or  stenosis.  13. The pulmonic valve was grossly normal. Pulmonic valve regurgitation is  not visualized.  14. Severely elevated pulmonary artery systolic pressure.  15. Patient appears to be in rate controlled atrial flutter throughout  study. Study ended early due to patient request.  16. The interatrial septum was not assessed.   EKG:  EKG is not ordered today.    Recent Labs: 02/21/2019: ALT 27 12/01/2019: BNP 194.1; BUN 37; Creatinine, Ser 1.31; Hemoglobin 14.9; Platelets 174; Potassium 4.6; Sodium 139  Recent Lipid Panel    Component Value Date/Time   CHOL 123 04/18/2018 0833   TRIG 117 04/18/2018 0833   HDL  37 (L) 04/18/2018 0833   CHOLHDL 3.3 04/18/2018 0833   CHOLHDL 3.6 CALC 03/11/2008 0930   VLDL 21 03/11/2008 0930   LDLCALC 63 04/18/2018 0833     Risk Assessment/Calculations:       Physical Exam:    VS:  BP 124/67   Pulse 73   Ht 6' (1.829 m)   Wt 263 lb (119.3 kg)   SpO2 (!) 88%   BMI 35.67 kg/m     Wt Readings from Last 3 Encounters:  12/01/19 263 lb (119.3 kg)  09/30/19 262 lb 9.6 oz (119.1 kg)  08/26/19 262 lb (118.8 kg)     GEN:  Well nourished, well developed in no acute distress HEENT: Normal NECK: No JVD; No carotid bruits LYMPHATICS: No lymphadenopathy CARDIAC: RRR, no murmurs, rubs, gallops RESPIRATORY:  Clear to auscultation without rales, wheezing or rhonchi  ABDOMEN: Soft, non-tender, non-distended MUSCULOSKELETAL:  No edema; No deformity  SKIN: Warm and dry NEUROLOGIC:  Alert and oriented x 3 PSYCHIATRIC:  Normal affect   ASSESSMENT:    1. Dizziness   2. Medication management   3. Coronary artery disease involving coronary bypass graft of native heart without angina pectoris   4. Essential hypertension   5. Hyperlipidemia, unspecified hyperlipidemia type   6. Controlled type 2 diabetes mellitus without complication, without long-term current use of insulin ([place])   7. PAF (paroxysmal atrial fibrillation) ([place])   8. Acute on chronic diastolic  HF (heart failure) ([place])    PLAN:    In order of problems listed above:  1. Dizziness: Unclear if the dizziness is related to volume overload and shortness of breath or ectopy episodic pauses.  I wanted to hold off on her monitor at this point.  I recommended increase diuretic to treat volume overload first.  O2 saturation was 88% in the clinic, he does have home oxygen at home.  He will need to use home oxygen to keep O2 saturation above 80%.  2. Acute on chronic diastolic heart failure: He appears to be volume overloaded again.  His wife has been giving him 60 mg twice daily of torsemide for the past 3  days instead of the previous 40 mg twice daily dosing.  I recommended continue this higher dose for 3 more days before going back down to 40 mg twice daily Monday through Friday and a 60 mg twice daily on the weekend.  He will need a basic metabolic panel today and also in 2 weeks.  On the day he takes to 60 mg twice daily dosing of torsemide, he will also need to double up on his potassium supplement  3. CAD s/p CABG: Denies any recent chest pain  4. Hypertension: Blood pressure stable on current therapy  5. Hyperlipidemia: Continue Lipitor  6. DM2: Managed by primary care provider  7. PAF: On Coumadin therapy.   Medication Adjustments/Labs and Tests Ordered: Current medicines are reviewed at length with the patient today.  Concerns regarding medicines are outlined above.  Orders Placed This Encounter  Procedures  . Basic metabolic panel  . CBC  . Brain natriuretic peptide  . Basic metabolic panel   Meds ordered this encounter  Medications  . potassium chloride (KLOR-CON) 10 MEQ tablet    Sig: Take 1 tablet (10 mEq total) by mouth every morning.    Dispense:  180 tablet    Refill:  3  . torsemide (DEMADEX) 20 MG tablet    Sig: Take 40 mg BID PO M-F and Take 60 mg BID PO Saturdays and Sundays    Dispense:  360 tablet    Refill:  3    Patient Instructions  Medication Instructions:   TAKE Torsemide 60 mg 2 times a day for 3 days. Then  Take 40 mg 2 times a day Monday - Friday and 60 mg 2 times a day on Saturdays and Sundays  TAKE double of Potassium on days Torsemide is take 60 mg 2 times a day. Monday-Friday take 10 meq and on Saturdays and Sundays take 20 meq. *If you need a refill on your cardiac medications before your next appointment, please call your pharmacy*  Lab Work: Your physician recommends that you return for lab work TODAY:   BMET  BNP  CBC  Your physician recommends that you return for lab work in 2 weeks:   BMET If you have labs (blood work) drawn  today and your tests are completely normal, you will receive your results only by: Marland Kitchen MyChart Message (if you have MyChart) OR . A paper copy in the mail If you have any lab test that is abnormal or we need to change your treatment, we will call you to review the results.  Testing/Procedures: NONE ordered at this time of appointment   Follow-Up: At [facility], you and your health needs are our priority.  As part of our continuing mission to provide you with exceptional heart care, we have created designated Provider Care  Teams.  These Care Teams include your primary Cardiologist (physician) and Advanced Practice Providers (APPs -  Physician Assistants and Nurse Practitioners) who all work together to provide you with the care you need, when you need it.  Your next appointment:   3-4 week(s)  The format for your next appointment:   In Person  Provider:   Almyra Deforest, PA-C  Other Instructions      Signed, Almyra Deforest, [place]  12/03/2019 11:57 PM    [place]

## 2019-12-01 NOTE — Patient Instructions (Signed)
Medication Instructions:   TAKE Torsemide 60 mg 2 times a day for 3 days. Then  Take 40 mg 2 times a day Monday - Friday and 60 mg 2 times a day on Saturdays and Sundays  TAKE double of Potassium on days Torsemide is take 60 mg 2 times a day. Monday-Friday take 10 meq and on Saturdays and Sundays take 20 meq. *If you need a refill on your cardiac medications before your next appointment, please call your pharmacy*  Lab Work: Your physician recommends that you return for lab work TODAY:   BMET  BNP  CBC  Your physician recommends that you return for lab work in 2 weeks:   BMET If you have labs (blood work) drawn today and your tests are completely normal, you will receive your results only by:  [employer] (if you have [place]) OR  A paper copy in the mail If you have any lab test that is abnormal or we need to change your treatment, we will call you to review the results.  Testing/Procedures: NONE ordered at this time of appointment   Follow-Up: At [facility], you and your health needs are our priority.  As part of our continuing mission to provide you with exceptional heart care, we have created designated Provider Care Teams.  These Care Teams include your primary Cardiologist (physician) and Advanced Practice Providers (APPs -  Physician Assistants and Nurse Practitioners) who all work together to provide you with the care you need, when you need it.  Your next appointment:   3-4 week(s)  The format for your next appointment:   In Person  Provider:   Almyra Deforest, PA-C  Other Instructions

## 2019-12-02 LAB — BASIC METABOLIC PANEL
BUN/Creatinine Ratio: 28 — ABNORMAL HIGH (ref 10–24)
BUN: 37 mg/dL — ABNORMAL HIGH (ref 8–27)
CO2: 27 mmol/L (ref 20–29)
Calcium: 9.3 mg/dL (ref 8.6–10.2)
Chloride: 100 mmol/L (ref 96–106)
Creatinine, Ser: 1.31 mg/dL — ABNORMAL HIGH (ref 0.76–1.27)
GFR calc Af Amer: 66 mL/min/{1.73_m2} (ref >59)
GFR calc non Af Amer: 57 mL/min/{1.73_m2} — ABNORMAL LOW (ref >59)
Glucose: 301 mg/dL — ABNORMAL HIGH (ref 65–99)
Potassium: 4.6 mmol/L (ref 3.5–5.2)
Sodium: 139 mmol/L (ref 134–144)

## 2019-12-02 LAB — CBC
Hematocrit: 44.6 % (ref 37.5–51.0)
Hemoglobin: 14.9 g/dL (ref 13.0–17.7)
MCH: 29 pg (ref 26.6–33.0)
MCHC: 33.4 g/dL (ref 31.5–35.7)
MCV: 87 fL (ref 79–97)
Platelets: 174 10*3/uL (ref 150–450)
RBC: 5.14 x10E6/uL (ref 4.14–5.80)
RDW: 13.9 % (ref 11.6–15.4)
WBC: 7.7 10*3/uL (ref 3.4–10.8)

## 2019-12-02 LAB — BRAIN NATRIURETIC PEPTIDE: BNP: 194.1 pg/mL — ABNORMAL HIGH (ref 0.0–100.0)

## 2019-12-03 ENCOUNTER — Encounter: Payer: Self-pay | Admitting: Physician Assistant

## 2019-12-09 NOTE — Addendum Note (Signed)
Addended by: Jacqulynn Cadet on: 12/09/2019 03:58 PM   Modules accepted: Orders

## 2019-12-15 ENCOUNTER — Other Ambulatory Visit: Payer: Self-pay

## 2019-12-15 DIAGNOSIS — Z79899 Other long term (current) drug therapy: Secondary | ICD-10-CM

## 2019-12-16 LAB — BASIC METABOLIC PANEL
BUN/Creatinine Ratio: 27 — ABNORMAL HIGH (ref 10–24)
BUN: 35 mg/dL — ABNORMAL HIGH (ref 8–27)
CO2: 25 mmol/L (ref 20–29)
Calcium: 9.2 mg/dL (ref 8.6–10.2)
Chloride: 95 mmol/L — ABNORMAL LOW (ref 96–106)
Creatinine, Ser: 1.31 mg/dL — ABNORMAL HIGH (ref 0.76–1.27)
GFR calc Af Amer: 66 mL/min/{1.73_m2} (ref >59)
GFR calc non Af Amer: 57 mL/min/{1.73_m2} — ABNORMAL LOW (ref >59)
Glucose: 379 mg/dL — ABNORMAL HIGH (ref 65–99)
Potassium: 4.2 mmol/L (ref 3.5–5.2)
Sodium: 135 mmol/L (ref 134–144)

## 2019-12-24 ENCOUNTER — Other Ambulatory Visit: Payer: Self-pay

## 2019-12-24 ENCOUNTER — Ambulatory Visit (INDEPENDENT_AMBULATORY_CARE_PROVIDER_SITE_OTHER): Payer: Medicare HMO

## 2019-12-24 DIAGNOSIS — I2119 ST elevation (STEMI) myocardial infarction involving other coronary artery of inferior wall: Secondary | ICD-10-CM | POA: Diagnosis not present

## 2019-12-24 DIAGNOSIS — Z5181 Encounter for therapeutic drug level monitoring: Secondary | ICD-10-CM | POA: Diagnosis not present

## 2019-12-24 DIAGNOSIS — Z86711 Personal history of pulmonary embolism: Secondary | ICD-10-CM

## 2019-12-24 DIAGNOSIS — Z86718 Personal history of other venous thrombosis and embolism: Secondary | ICD-10-CM | POA: Diagnosis not present

## 2019-12-24 DIAGNOSIS — Z7901 Long term (current) use of anticoagulants: Secondary | ICD-10-CM

## 2019-12-24 DIAGNOSIS — I480 Paroxysmal atrial fibrillation: Secondary | ICD-10-CM

## 2019-12-24 LAB — POCT INR: INR: 2.7 (ref 2.0–3.0)

## 2019-12-24 NOTE — Patient Instructions (Signed)
-   continue dosage of 9 mg every day EXCEPT 5 mg on [place]. - recheck in 6 weeks

## 2019-12-26 ENCOUNTER — Ambulatory Visit: Payer: Medicare HMO | Admitting: Physician Assistant

## 2020-01-22 ENCOUNTER — Other Ambulatory Visit: Payer: Self-pay

## 2020-01-22 ENCOUNTER — Ambulatory Visit: Payer: Medicare HMO | Admitting: Physician Assistant

## 2020-01-22 ENCOUNTER — Encounter: Payer: Self-pay | Admitting: Physician Assistant

## 2020-01-22 ENCOUNTER — Encounter: Admission: EM | Disposition: E | Payer: Self-pay | Source: Home / Self Care | Attending: Internal Medicine

## 2020-01-22 VITALS — BP 119/77 | HR 75 | Ht 72.0 in | Wt 261.2 lb

## 2020-01-22 DIAGNOSIS — I25810 Atherosclerosis of coronary artery bypass graft(s) without angina pectoris: Secondary | ICD-10-CM

## 2020-01-22 DIAGNOSIS — R0600 Dyspnea, unspecified: Secondary | ICD-10-CM

## 2020-01-22 DIAGNOSIS — E785 Hyperlipidemia, unspecified: Secondary | ICD-10-CM

## 2020-01-22 DIAGNOSIS — Z79899 Other long term (current) drug therapy: Secondary | ICD-10-CM

## 2020-01-22 DIAGNOSIS — I5033 Acute on chronic diastolic (congestive) heart failure: Secondary | ICD-10-CM

## 2020-01-22 DIAGNOSIS — I441 Atrioventricular block, second degree: Secondary | ICD-10-CM

## 2020-01-22 DIAGNOSIS — I10 Essential (primary) hypertension: Secondary | ICD-10-CM

## 2020-01-22 DIAGNOSIS — E119 Type 2 diabetes mellitus without complications: Secondary | ICD-10-CM

## 2020-01-22 DIAGNOSIS — I480 Paroxysmal atrial fibrillation: Secondary | ICD-10-CM

## 2020-01-22 SURGERY — CORONARY/GRAFT ACUTE MI REVASCULARIZATION
Anesthesia: Moderate Sedation

## 2020-01-22 MED ORDER — POTASSIUM CHLORIDE CRYS ER 20 MEQ PO TBCR: 20.0000 meq | EXTENDED_RELEASE_TABLET | Freq: Every day | ORAL | 1 refills | Status: AC

## 2020-01-22 MED ORDER — TORSEMIDE 20 MG PO TABS: 60.0000 mg | ORAL_TABLET | Freq: Two times a day (BID) | ORAL | 3 refills | Status: AC

## 2020-01-22 NOTE — Patient Instructions (Signed)
Medication Instructions:   INCREASE Torsemide to 60 mg 2 times a day  INCREASE Potassium to 20 meq daily   *If you need a refill on your cardiac medications before your next appointment, please call your pharmacy*  Lab Work: Your physician recommends that you return for lab work in 5-7 days:   BMET  If you have labs (blood work) drawn today and your tests are completely normal, you will receive your results only by: Marland Kitchen MyChart Message (if you have MyChart) OR . A paper copy in the mail If you have any lab test that is abnormal or we need to change your treatment, we will call you to review the results.  Testing/Procedures: NONE ordered at this time of appointment   Follow-Up: At [facility], you and your health needs are our priority.  As part of our continuing mission to provide you with exceptional heart care, we have created designated Provider Care Teams.  These Care Teams include your primary Cardiologist (physician) and Advanced Practice Providers (APPs -  Physician Assistants and Nurse Practitioners) who all work together to provide you with the care you need, when you need it.  Your next appointment:   2-3 month(s)  The format for your next appointment:   In Person  Provider:   Minus Breeding, MD  Other Instructions

## 2020-01-22 NOTE — Progress Notes (Signed)
Cardiology Office Note:    Date:  01/24/2020   ID:  Bobby Lindsey, DOB 12-Aug-1955, MRN [id number]  PCP:  Adin Hector, MD  [facility] Cardiologist:  Minus Breeding, MD  [place] Electrophysiologist:  None   Referring MD: Adin Hector, MD   Chief Complaint  Patient presents with  . Follow-up    Seen for Dr. Percival Spanish    History of Present Illness:    Bobby Lindsey is a 64 y.o. male with a hx of CAD s/p CABG 2002 and DES to RCA, recurrent PE/DVT, HTN, HLD, DM, PAFand OSA. He was admitted in February 2015 with inferior STEMI and underwent emergent cardiac catheterization. This demonstrated patent SVG to OM1 and LIMA to LAD, occluded SVG to diagonal and SVG to PDA. EF 40% with inferior wall abnormality. Echo showed normal LVEF prior to discharge. Culprit was felt to be occluded native left circumflex which was treated with drug-eluting stent. Hospitalization complicated by transient atrial fibrillation and acute on chronic diastolic heart failure. He has a history of elevated CK and remain off of statin. After he was started on amiodarone for A. fib, he developed Mobitz II AV block, beta blocker was discontinued. He later self discontinued amiodarone thinking it was causing dyspnea. Plavix was discontinued in September 2016. Repeat echocardiogram obtained on 11/24/2016 showed EF 55 to 60%, severe LAE, mild focal basal hypertrophy of the septum, PA peak pressure 35 mmHg. He had PCI of his left mid to distal anterior tibial artery in November 2018.   He later underwent left BKA at [facility] health.Hewas admitted with worsening lower extremity edema and drainage in May 2020 despite increasing Lasix to 160 mg daily. CTA of the chest was negative for PE. Venous Doppler negative. He underwent IV diuresis and antibiotic treatment. EKG shows he is in atrial flutter and eventually underwent cardioversion on 06/19/2018. He was discharged on 80 mg daily of Lasix.  At discharge weight was 251 pounds. Ziopatch after discharge demonstrated recurrent A. fib with slow ventricular rate. He also had pauses during sleep with maximal pauseof 3.8 seconds.Patient was readmitted to the hospital in January 2021 with acute on chronic diastolic heart failure and recurrent atrial flutter. He underwent another cardioversion.   He has multiple episodes of volume overload this year.  We initially increase his torsemide to 60 mg twice daily for 3 days before going back to 40 mg twice daily thereafter, we did this several times.  During the last office visit on 12/01/2019, he continued to appears to be volume overloaded.  I increased his torsemide again to 60 mg twice daily for 3 days, after that, I decrease his torsemide to 40 mg twice daily Monday through Friday, then 60 mg twice daily Saturday through Sunday.   Patient presents today for follow-up.  He continued to have dyspnea on minimal exertion.  O2 saturation borderline low.  I want to try trial of higher diuretic.  I will permanently increase the torsemide to 60 mg twice daily and potassium supplement to 20 mg daily.  He will need a basic metabolic panel in 1 week.  Otherwise I recommended referral to pulmonology service for further evaluation of his shortness of breath to see if there is a pulmonary component.   Past Medical History:  Diagnosis Date  . Anginal pain ([place])    last pm  . Atrial fibrillation ([place])    a. Transient during 03/2013 admission.  . Bradycardia    a.  Bradycardia/pauses/possible Mobitz II during 03/2013 adm. Not on BB due to this.  Marland Kitchen CAD (coronary artery disease)    a. s/p CABG 2002. b. Hx Cypher stent to the RCA. c. Inf-lat STEMI 03/2013:  LHC (04/05/13):  mLAD occluded, pD1 90, apical br of Dx occluded, CFX occluded, pOM1 90-95, RCA stents patent, diff RCA 30, S-Dx occluded, S-PDA occluded, S-OM1 40-50, L-LAD patent, EF 40% with inf HK.  PCI:  Promus (2.5 x 28) DES to mid to dist CFX.  Marland Kitchen  Charcot's joint of knee   . COPD (chronic obstructive pulmonary disease) ([place])   . Deep venous thrombosis (HCC)    right lower extremity  . Diabetes mellitus    a. A1C 10.7 in 03/2013.  . Diastolic CHF ([place])    a. EF 40% by cath, 55-60% during 03/2013 adm, required IV diuresis.  . DVT, lower extremity, recurrent ([place])    a. Hx recurrent DVT per record.  . Dyslipidemia   . Elevated CK    a. Pt has refused rheum workup in the past.  . GERD (gastroesophageal reflux disease)   . History of epidermal inclusion cyst excision 07/09/2018  . History of hiatal hernia   . HTN (hypertension)    x 15 years  . Hx of cardiovascular stress test    a. Lexiscan Myoview (03/2010):  diaph atten vs inf scar, no ischemia, EF 47%; Low Risk.  Marland Kitchen Hx of echocardiogram    a. Echo (04/08/13):  Mild LVH, EF 55-60%, restrictive physiology, severe LAE, mild reduced RVSF, mild RAE.  . Leg pain    ABI 6/16:  R 1.2, L 1.1 - normal  . Myocardial infarction ([place])   . Obesity   . Peripheral neuropathy   . Pulmonary embolism ([place])   . Sleep apnea     Past Surgical History:  Procedure Laterality Date  . CARDIOVERSION N/A 06/19/2018   Procedure: CARDIOVERSION;  Surgeon: Acie Fredrickson Wonda Cheng, MD;  Location: [facility] ENDOSCOPY;  Service: Cardiovascular;  Laterality: N/A;  . CARDIOVERSION N/A 02/21/2019   Procedure: CARDIOVERSION;  Surgeon: Fay Records, MD;  Location: [place];  Service: Cardiovascular;  Laterality: N/A;  . CORONARY ANGIOPLASTY WITH STENT PLACEMENT    . CORONARY ARTERY BYPASS GRAFT     4 time since 2002  . LEFT HEART CATHETERIZATION WITH CORONARY/GRAFT ANGIOGRAM  04/05/2013   Procedure: LEFT HEART CATHETERIZATION WITH Beatrix Fetters;  Surgeon: Peter M Martinique, MD;  Location: [facility] CATH LAB;  Service: Cardiovascular;;  . left knee surgery    . LOWER EXTREMITY ANGIOGRAPHY Left 12/25/2016   Procedure: LOWER EXTREMITY ANGIOGRAPHY;  Surgeon: Algernon Huxley, MD;  Location: [place] CV LAB;  Service:  Cardiovascular;  Laterality: Left;  . PERCUTANEOUS CORONARY STENT INTERVENTION (PCI-S)  04/05/2013   Procedure: PERCUTANEOUS CORONARY STENT INTERVENTION (PCI-S);  Surgeon: Peter M Martinique, MD;  Location: [facility] CATH LAB;  Service: Cardiovascular;;  DES to native Mid cx  . VASCULAR SURGERY      Current Medications: Current Meds  Medication Sig  . acetaminophen (TYLENOL) 500 MG tablet Take 1,000 mg every 8 (eight) hours as needed by mouth for mild pain or headache.   Marland Kitchen atorvastatin (LIPITOR) 20 MG tablet TAKE 1 TABLET BY MOUTH EVERY DAY  . Blood Glucose Monitoring Suppl (FIFTY50 GLUCOSE METER 2.0) w/Device KIT Use as directed.  . fluticasone (FLONASE) 50 MCG/ACT nasal spray Place 2 sprays into both nostrils daily as needed for allergies or rhinitis.   Marland Kitchen gabapentin (NEURONTIN) 300 MG capsule Take 600  mg by mouth at bedtime as needed (pain in foot and ankle).   . insulin glargine, 1 Unit Dial, (TOUJEO) 300 UNIT/ML Solostar Pen Inject 70 Units into the skin every morning.   . insulin lispro (HUMALOG) 100 UNIT/ML injection Inject 30 Units into the skin 3 (three) times daily before meals.   . Insulin Syringe-Needle U-100 31G X 5/16" 0.3 ML MISC use as directed  . isosorbide mononitrate (IMDUR) 30 MG 24 hr tablet TAKE 1 TABLET BY MOUTH EVERY DAY  . losartan (COZAAR) 25 MG tablet TAKE 1 TABLET BY MOUTH EVERY DAY  . metFORMIN (GLUCOPHAGE-XR) 500 MG 24 hr tablet Take 500 mg by mouth daily with breakfast.  . nitroGLYCERIN (NITROSTAT) 0.4 MG SL tablet Place 1 tablet (0.4 mg total) under the tongue every 5 (five) minutes as needed for chest pain.  . pantoprazole (PROTONIX) 40 MG tablet TAKE 1 TABLET BY MOUTH EVERY DAY IN THE EVENING  . warfarin (COUMADIN) 4 MG tablet Take 1 tablet (with 5 mg tablet) 2-3 times per week as directed  . warfarin (COUMADIN) 5 MG tablet TAKE 1 TABLET (5 MG TOTAL) BY MOUTH AS DIRECTED. TAKE AS DIRECTED BY THE COUMADIN CLINIC.  . [DISCONTINUED] potassium chloride (KLOR-CON) 10 MEQ  tablet Take 1 tablet (10 mEq total) by mouth every morning.  . [DISCONTINUED] torsemide (DEMADEX) 20 MG tablet Take 40 mg BID PO M-F and Take 60 mg BID PO Saturdays and Sundays     Allergies:   Simvastatin   Social History   Socioeconomic History  . Marital status: Married    Spouse name: Not on file  . Number of children: Not on file  . Years of education: Not on file  . Highest education level: Not on file  Occupational History  . Occupation: CONSTRUCTION    Employer: [employer]    Comment: Clinical biochemist  Tobacco Use  . Smoking status: Never Smoker  . Smokeless tobacco: Never Used  Vaping Use  . Vaping Use: Never used  Substance and Sexual Activity  . Alcohol use: Yes    Comment: rare  . Drug use: No  . Sexual activity: Yes  Other Topics Concern  . 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    Social Determinants of Health   Financial Resource Strain: Not on file  Food Insecurity: Not on file  Transportation Needs: Not on file  Physical Activity: Not on file  Stress: Not on file  Social Connections: Not on file     Family History: The patient's family history includes Cancer in his father; Heart disease in his mother; Hypertension in his mother and sister. There is no history of Heart attack or Stroke.  ROS:   Please see the history of present illness.     All other systems reviewed and are negative.  EKGs/Labs/Other Studies Reviewed:    The following studies were reviewed today:  Echo 02/20/2019 1. Left ventricular ejection fraction, by visual estimation, is 55 to  60%. The left ventricle has normal function. There is no left ventricular  hypertrophy.  2. Left ventricular diastolic function could not be evaluated.  3. Mildly dilated left ventricular internal cavity size.  4. The left ventricle has no regional wall motion abnormalities.  5. Global right ventricle has normal  systolic function.The right  ventricular size is normal. No increase in right ventricular wall  thickness.  6. Left atrial size was mildly dilated.  7. Right atrial  size was moderately dilated.  8. The pericardium was not well visualized.  9. Mild mitral annular calcification.  10. The mitral valve is normal in structure. Trivial mitral valve  regurgitation.  11. The tricuspid valve is normal in structure.  12. The aortic valve has an indeterminant number of cusps. Aortic valve  regurgitation is not visualized. No evidence of aortic valve sclerosis or  stenosis.  13. The pulmonic valve was grossly normal. Pulmonic valve regurgitation is  not visualized.  14. Severely elevated pulmonary artery systolic pressure.  15. Patient appears to be in rate controlled atrial flutter throughout  study. Study ended early due to patient request.  16. The interatrial septum was not assessed.   EKG:  EKG is not ordered today.   Recent Labs: 02/21/2019: ALT 27 12/01/2019: BNP 194.1; Hemoglobin 14.9; Platelets 174 12/15/2019: BUN 35; Creatinine, Ser 1.31; Potassium 4.2; Sodium 135  Recent Lipid Panel    Component Value Date/Time   CHOL 123 04/18/2018 0833   TRIG 117 04/18/2018 0833   HDL 37 (L) 04/18/2018 0833   CHOLHDL 3.3 04/18/2018 0833   CHOLHDL 3.6 CALC 03/11/2008 0930   VLDL 21 03/11/2008 0930   LDLCALC 63 04/18/2018 0833     Risk Assessment/Calculations:     CHA2DS2-VASc Score = 5  This indicates a 7.2% annual risk of stroke. The patient's score is based upon: CHF History: Yes HTN History: Yes Diabetes History: Yes Stroke History: No Vascular Disease History: Yes Age Score: 1 Gender Score: 0      Physical Exam:    VS:  BP 119/77   Pulse 75   Ht 6' (1.829 m)   Wt 261 lb 3.2 oz (118.5 kg)   SpO2 (!) 89%   BMI 35.43 kg/m     Wt Readings from Last 3 Encounters:  01/17/2020 261 lb 3.2 oz (118.5 kg)  12/01/19 263 lb (119.3 kg)  09/30/19 262 lb 9.6 oz (119.1 kg)      GEN:  Well nourished, well developed in no acute distress HEENT: Normal NECK: No JVD; No carotid bruits LYMPHATICS: No lymphadenopathy CARDIAC: RRR, no murmurs, rubs, gallops RESPIRATORY:  Clear to auscultation without rales, wheezing or rhonchi  ABDOMEN: Soft, non-tender, non-distended MUSCULOSKELETAL:  No edema; No deformity  SKIN: Warm and dry NEUROLOGIC:  Alert and oriented x 3 PSYCHIATRIC:  Normal affect   ASSESSMENT:    1. Dyspnea, unspecified type   2. Medication management   3. Acute on chronic diastolic HF (heart failure) ([place])   4. Coronary artery disease involving coronary bypass graft of native heart without angina pectoris   5. Essential hypertension   6. Hyperlipidemia LDL goal <70   7. Controlled type 2 diabetes mellitus without complication, without long-term current use of insulin ([place])   8. PAF (paroxysmal atrial fibrillation) ([place])   9. Mobitz II    PLAN:    In order of problems listed above:  1. Dyspnea: O2 saturation is borderline.  I will increase his diuretic as a trial, however I am not confident that his shortness of breath is all cardiac.  I suspect he has some degree of underlying pulmonary issue as seen on the previous CT.  I recommend referral to pulmonology service.  2. Acute on chronic diastolic heart failure: 1+ pitting edema in the right lower extremity.  I recommended permanently increase torsemide to 60 mg twice daily while increase potassium to 20 mEq daily.  Basic metabolic panel in 1 week  3. CAD: Denies any recent chest  discomfort  4. Hypertension: Blood pressure stable  5. Hyperlipidemia: Continue Lipitor  6. DM2: Managed by primary care provider  7. PAF: No recurrence.  On Coumadin.  Not on any AV nodal blocking agent given history of bradycardia and Mobitz 2.  8. History of Mobitz 2: Occurred on beta-blocker.  No AV nodal blocking agent at this point.    Shared Decision Making/Informed Consent        Medication  Adjustments/Labs and Tests Ordered: Current medicines are reviewed at length with the patient today.  Concerns regarding medicines are outlined above.  Orders Placed This Encounter  Procedures  . Basic metabolic panel  . Ambulatory referral to Pulmonology   Meds ordered this encounter  Medications  . potassium chloride (KLOR-CON) 20 MEQ tablet    Sig: Take 1 tablet (20 mEq total) by mouth daily.    Dispense:  90 tablet    Refill:  1  . torsemide (DEMADEX) 20 MG tablet    Sig: Take 3 tablets (60 mg total) by mouth 2 (two) times daily. Take 40 mg BID PO M-F and Take 60 mg BID PO Saturdays and Sundays    Dispense:  360 tablet    Refill:  3    Patient Instructions  Medication Instructions:   INCREASE Torsemide to 60 mg 2 times a day  INCREASE Potassium to 20 meq daily   *If you need a refill on your cardiac medications before your next appointment, please call your pharmacy*  Lab Work: Your physician recommends that you return for lab work in 5-7 days:   BMET  If you have labs (blood work) drawn today and your tests are completely normal, you will receive your results only by: Marland Kitchen MyChart Message (if you have MyChart) OR . A paper copy in the mail If you have any lab test that is abnormal or we need to change your treatment, we will call you to review the results.  Testing/Procedures: NONE ordered at this time of appointment   Follow-Up: At [facility], you and your health needs are our priority.  As part of our continuing mission to provide you with exceptional heart care, we have created designated Provider Care Teams.  These Care Teams include your primary Cardiologist (physician) and Advanced Practice Providers (APPs -  Physician Assistants and Nurse Practitioners) who all work together to provide you with the care you need, when you need it.  Your next appointment:   2-3 month(s)  The format for your next appointment:   In Person  Provider:   Minus Breeding,  MD  Other Instructions      Signed, Almyra Deforest, [place]  01/24/2020 1:14 AM    [place]

## 2020-01-24 ENCOUNTER — Encounter: Payer: Self-pay | Admitting: Physician Assistant

## 2020-01-28 ENCOUNTER — Institutional Professional Consult (permissible substitution): Payer: Medicare HMO | Admitting: Pulmonary Disease

## 2020-01-30 ENCOUNTER — Other Ambulatory Visit: Payer: Self-pay

## 2020-01-30 ENCOUNTER — Ambulatory Visit: Payer: Medicare HMO | Admitting: Pulmonary Disease

## 2020-01-30 VITALS — BP 128/62 | HR 54 | Temp 97.3°F | Ht 72.0 in | Wt 266.0 lb

## 2020-01-30 DIAGNOSIS — R0602 Shortness of breath: Secondary | ICD-10-CM | POA: Diagnosis not present

## 2020-01-30 DIAGNOSIS — I272 Pulmonary hypertension, unspecified: Secondary | ICD-10-CM

## 2020-01-30 DIAGNOSIS — G4730 Sleep apnea, unspecified: Secondary | ICD-10-CM | POA: Diagnosis not present

## 2020-01-30 DIAGNOSIS — Z6835 Body mass index (BMI) 35.0-35.9, adult: Secondary | ICD-10-CM | POA: Diagnosis not present

## 2020-01-30 DIAGNOSIS — I5032 Chronic diastolic (congestive) heart failure: Secondary | ICD-10-CM | POA: Diagnosis not present

## 2020-01-30 NOTE — Progress Notes (Signed)
Synopsis: Referred in December 2021 for dyspnea on exertion by Almyra Deforest, PA  Subjective:   PATIENT ID: Bobby Lindsey GENDER: male DOB: 03/27/1955, MRN: [id number]  Chief Complaint  Patient presents with  . Consult    Cardiology referred pt over for [facility] with exertion. SHOB with bending over putting his leg on .     64 year old gentleman, past medical history of atrial fibrillation, coronary artery disease, type 2 diabetes, left BKA, history of DVT, morbid obesity, MI hypertension hyperlipidemia, CPAP noncompliance, OSA.  Patient has complaints today of shortness of breath.  This has been going on for some time.  He does feel like it is worse when he exerts himself.  Also noticed increased lower extremity edema mainly in the right, BKA on the left.  Also has increased abdominal girth.  Prior echocardiogram with preserved ejection fraction however he does have severely elevated pulmonary artery systolic pressures.,  Chronic diastolic heart failure.  He has been diagnosed with sleep apnea in the past but did not like wearing a full mask.  Never tried a nasal only mask.  Tries to take his diuretics regularly but has noticed some increased swelling in his right lower extremity.  Patient last seen by cardiology on 01/15/2020 office note reviewed patient is a lifelong non-smoker.     Past Medical History:  Diagnosis Date  . Anginal pain ([place])    last pm  . Atrial fibrillation ([place])    a. Transient during 03/2013 admission.  . Bradycardia    a. Bradycardia/pauses/possible Mobitz II during 03/2013 adm. Not on BB due to this.  Marland Kitchen CAD (coronary artery disease)    a. s/p CABG 2002. b. Hx Cypher stent to the RCA. c. Inf-lat STEMI 03/2013:  LHC (04/05/13):  mLAD occluded, pD1 90, apical br of Dx occluded, CFX occluded, pOM1 90-95, RCA stents patent, diff RCA 30, S-Dx occluded, S-PDA occluded, S-OM1 40-50, L-LAD patent, EF 40% with inf HK.  PCI:  Promus (2.5 x 28) DES to mid to dist CFX.  Marland Kitchen Charcot's  joint of knee   . COPD (chronic obstructive pulmonary disease) ([place])   . Deep venous thrombosis (HCC)    right lower extremity  . Diabetes mellitus    a. A1C 10.7 in 03/2013.  . Diastolic CHF ([place])    a. EF 40% by cath, 55-60% during 03/2013 adm, required IV diuresis.  . DVT, lower extremity, recurrent ([place])    a. Hx recurrent DVT per record.  . Dyslipidemia   . Elevated CK    a. Pt has refused rheum workup in the past.  . GERD (gastroesophageal reflux disease)   . History of epidermal inclusion cyst excision 07/09/2018  . History of hiatal hernia   . HTN (hypertension)    x 15 years  . Hx of cardiovascular stress test    a. Lexiscan Myoview (03/2010):  diaph atten vs inf scar, no ischemia, EF 47%; Low Risk.  Marland Kitchen Hx of echocardiogram    a. Echo (04/08/13):  Mild LVH, EF 55-60%, restrictive physiology, severe LAE, mild reduced RVSF, mild RAE.  . Leg pain    ABI 6/16:  R 1.2, L 1.1 - normal  . Myocardial infarction ([place])   . Obesity   . Peripheral neuropathy   . Pulmonary embolism ([place])   . Sleep apnea      Family History  Problem Relation Age of Onset  . Heart disease Mother   . Hypertension Mother   . Cancer Father   .  Hypertension Sister   . Heart attack Neg Hx   . Stroke Neg Hx      Past Surgical History:  Procedure Laterality Date  . CARDIOVERSION N/A 06/19/2018   Procedure: CARDIOVERSION;  Surgeon: Acie Fredrickson Wonda Cheng, MD;  Location: [facility] ENDOSCOPY;  Service: Cardiovascular;  Laterality: N/A;  . CARDIOVERSION N/A 02/21/2019   Procedure: CARDIOVERSION;  Surgeon: Fay Records, MD;  Location: [place];  Service: Cardiovascular;  Laterality: N/A;  . CORONARY ANGIOPLASTY WITH STENT PLACEMENT    . CORONARY ARTERY BYPASS GRAFT     4 time since 2002  . LEFT HEART CATHETERIZATION WITH CORONARY/GRAFT ANGIOGRAM  04/05/2013   Procedure: LEFT HEART CATHETERIZATION WITH Beatrix Fetters;  Surgeon: Peter M Martinique, MD;  Location: [facility] CATH LAB;  Service: Cardiovascular;;  . left  knee surgery    . LOWER EXTREMITY ANGIOGRAPHY Left 12/25/2016   Procedure: LOWER EXTREMITY ANGIOGRAPHY;  Surgeon: Algernon Huxley, MD;  Location: [place] CV LAB;  Service: Cardiovascular;  Laterality: Left;  . PERCUTANEOUS CORONARY STENT INTERVENTION (PCI-S)  04/05/2013   Procedure: PERCUTANEOUS CORONARY STENT INTERVENTION (PCI-S);  Surgeon: Peter M Martinique, MD;  Location: [facility] CATH LAB;  Service: Cardiovascular;;  DES to native Mid cx  . VASCULAR SURGERY      Social History   Socioeconomic History  . Marital status: Married    Spouse name: Not on file  . Number of children: Not on file  . Years of education: Not on file  . Highest education level: Not on file  Occupational History  . Occupation: CONSTRUCTION    Employer: [employer]    Comment: Clinical biochemist  Tobacco Use  . Smoking status: Never Smoker  . Smokeless tobacco: Never Used  Vaping Use  . Vaping Use: Never used  Substance and Sexual Activity  . Alcohol use: Yes    Comment: rare  . Drug use: No  . Sexual activity: Yes  Other Topics Concern  . 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    Social Determinants of Health   Financial Resource Strain: Not on file  Food Insecurity: Not on file  Transportation Needs: Not on file  Physical Activity: Not on file  Stress: Not on file  Social Connections: Not on file  Intimate Partner Violence: Not on file     Allergies  Allergen Reactions  . Simvastatin Diarrhea and Other (See Comments)    Fatigue, diarrhea       Outpatient Medications Prior to Visit  Medication Sig Dispense Refill  . acetaminophen (TYLENOL) 500 MG tablet Take 1,000 mg every 8 (eight) hours as needed by mouth for mild pain or headache.     Marland Kitchen atorvastatin (LIPITOR) 20 MG tablet TAKE 1 TABLET BY MOUTH EVERY DAY 90 tablet 2  . Blood Glucose Monitoring Suppl (FIFTY50 GLUCOSE METER 2.0) w/Device KIT Use as directed.    .  fluticasone (FLONASE) 50 MCG/ACT nasal spray Place 2 sprays into both nostrils daily as needed for allergies or rhinitis.     Marland Kitchen gabapentin (NEURONTIN) 300 MG capsule Take 600 mg by mouth at bedtime as needed (pain in foot and ankle).     . insulin glargine, 1 Unit Dial, (TOUJEO) 300 UNIT/ML Solostar Pen Inject 70 Units into the skin every morning.     . insulin lispro (HUMALOG) 100 UNIT/ML injection Inject 30 Units into the skin 3 (three) times daily before meals.     . Insulin Syringe-Needle U-100  31G X 5/16" 0.3 ML MISC use as directed    . isosorbide mononitrate (IMDUR) 30 MG 24 hr tablet TAKE 1 TABLET BY MOUTH EVERY DAY 90 tablet 3  . losartan (COZAAR) 25 MG tablet TAKE 1 TABLET BY MOUTH EVERY DAY 90 tablet 3  . metFORMIN (GLUCOPHAGE-XR) 500 MG 24 hr tablet Take 500 mg by mouth daily with breakfast.    . nitroGLYCERIN (NITROSTAT) 0.4 MG SL tablet Place 1 tablet (0.4 mg total) under the tongue every 5 (five) minutes as needed for chest pain. 25 tablet 2  . pantoprazole (PROTONIX) 40 MG tablet TAKE 1 TABLET BY MOUTH EVERY DAY IN THE EVENING 90 tablet 3  . potassium chloride (KLOR-CON) 20 MEQ tablet Take 1 tablet (20 mEq total) by mouth daily. 90 tablet 1  . torsemide (DEMADEX) 20 MG tablet Take 3 tablets (60 mg total) by mouth 2 (two) times daily. Take 40 mg BID PO M-F and Take 60 mg BID PO Saturdays and Sundays 360 tablet 3  . warfarin (COUMADIN) 4 MG tablet Take 1 tablet (with 5 mg tablet) 2-3 times per week as directed 30 tablet 1  . warfarin (COUMADIN) 5 MG tablet TAKE 1 TABLET (5 MG TOTAL) BY MOUTH AS DIRECTED. TAKE AS DIRECTED BY THE COUMADIN CLINIC. 60 tablet 1   No facility-administered medications prior to visit.    Review of Systems  Constitutional: Positive for malaise/fatigue. Negative for chills, fever and weight loss.  HENT: Negative for hearing loss, sore throat and tinnitus.   Eyes: Negative for blurred vision and double vision.  Respiratory: Positive for shortness of breath.  Negative for cough, hemoptysis, sputum production, wheezing and stridor.   Cardiovascular: Positive for leg swelling. Negative for chest pain, palpitations, orthopnea and PND.  Gastrointestinal: Positive for abdominal pain. Negative for constipation, diarrhea, heartburn, nausea and vomiting.  Genitourinary: Negative for dysuria, hematuria and urgency.  Musculoskeletal: Positive for joint pain. Negative for myalgias.  Skin: Negative for itching and rash.  Neurological: Negative for dizziness, tingling, weakness and headaches.  Endo/Heme/Allergies: Negative for environmental allergies. Does not bruise/bleed easily.  Psychiatric/Behavioral: Negative for depression. The patient is not nervous/anxious and does not have insomnia.   All other systems reviewed and are negative.    Objective:  Physical Exam Vitals reviewed.  Constitutional:      General: He is not in acute distress.    Appearance: He is well-developed and well-nourished. He is obese.  HENT:     Head: Normocephalic and atraumatic.     Mouth/Throat:     Mouth: Oropharynx is clear and moist.  Eyes:     General: No scleral icterus.    Conjunctiva/sclera: Conjunctivae normal.     Pupils: Pupils are equal, round, and reactive to light.  Neck:     Vascular: No JVD.     Trachea: No tracheal deviation.  Cardiovascular:     Rate and Rhythm: Normal rate and regular rhythm.     Pulses: Intact distal pulses.     Heart sounds: Murmur heard.    Pulmonary:     Effort: Pulmonary effort is normal. No tachypnea, accessory muscle usage or respiratory distress.     Breath sounds: Normal breath sounds. No stridor. No wheezing, rhonchi or rales.  Abdominal:     General: Bowel sounds are normal. There is no distension.     Palpations: Abdomen is soft.     Tenderness: There is no abdominal tenderness.  Musculoskeletal:        General: Swelling and  deformity present. No tenderness or edema.     Cervical back: Neck supple.   Lymphadenopathy:     Cervical: No cervical adenopathy.  Skin:    General: Skin is warm and dry.     Capillary Refill: Capillary refill takes less than 2 seconds.     Findings: No rash.  Neurological:     Mental Status: He is alert and oriented to person, place, and time.  Psychiatric:        Mood and Affect: Mood and affect normal.        Behavior: Behavior normal.      Vitals:   01/30/20 1612  BP: 128/62  Pulse: (!) 54  Temp: (!) 97.3 F (36.3 C)  TempSrc: Tympanic  SpO2: 91%  Weight: 266 lb (120.7 kg)  Height: 6' (1.829 m)   91% on RA BMI Readings from Last 3 Encounters:  01/30/20 36.08 kg/m  02/07/2020 35.43 kg/m  12/01/19 35.67 kg/m   Wt Readings from Last 3 Encounters:  01/30/20 266 lb (120.7 kg)  01/24/2020 261 lb 3.2 oz (118.5 kg)  12/01/19 263 lb (119.3 kg)     CBC    Component Value Date/Time   WBC 7.7 12/01/2019 1618   WBC 7.8 02/22/2019 0112   RBC 5.14 12/01/2019 1618   RBC 5.07 02/22/2019 0112   HGB 14.9 12/01/2019 1618   HCT 44.6 12/01/2019 1618   PLT 174 12/01/2019 1618   MCV 87 12/01/2019 1618   MCH 29.0 12/01/2019 1618   MCH 28.2 02/22/2019 0112   MCHC 33.4 12/01/2019 1618   MCHC 32.6 02/22/2019 0112   RDW 13.9 12/01/2019 1618   LYMPHSABS 2.6 07/09/2018 2200   LYMPHSABS 2.1 03/31/2015 1100   MONOABS 0.9 07/09/2018 2200   EOSABS 0.2 07/09/2018 2200   EOSABS 0.2 03/31/2015 1100   BASOSABS 0.1 07/09/2018 2200   BASOSABS 0.0 03/31/2015 1100     Chest Imaging: 02/19/2019 chest x-ray: Enlarged cardiac silhouette vascular congestion.  06/17/2018 CTA chest: Negative for PE.  Nonspecific mediastinal and bilateral adenopathy.  Pulmonary Functions Testing Results: No flowsheet data found.  FeNO:  Pathology:   Echocardiogram:  January 2021  Heart Catheterization:     Assessment & Plan:     ICD-10-CM   1. SOB (shortness of breath)  R06.02 Pulmonary Function Test  2. Sleep apnea, unspecified type  G47.30   3. Chronic diastolic HF  (heart failure) (HCC)  I50.32   4. BMI 35.0-35.9,adult  Z68.35   5. Pulmonary hypertension ([place])  I27.20     Discussion:  This is a 64 year old gentleman multifactorial etiology of shortness of breath and dyspnea on exertion.  I believe that his sleep apnea needs to be better controlled because he is not doing this.  He has pulmonary hypertension likely related to his chronic diastolic heart failure.  Volume management is super important.  He has not had pulmonary function test in the past I think is reasonable to get these.  Additional time spent in office reviewing images as well as prior cardiology office visits.  Plan: PFTs ordered. Sleep referral/appointment to be scheduled with one of our sleep talks maybe he can try a nasal mask only as he did not like the full facemask. We will have him see one of our partners that specializes in pulmonary hypertension. May consider additional work-up for chronic right ventricular failure which may represent or explain some of his chronic abdominal and bloating symptoms/abdominal fullness symptoms and lower extremity edema. Encouraged him to continue to  take his diuretics as prescribed.  Appointment to be scheduled with Dr. Silas Flood on next follow-up after PFTs complete.  After the patient had already left the office review of his CT scan from a year ago revealed mediastinal and hilar adenopathy.  He should probably have a repeat noncontrasted CT of the chest to ensure either stability or resolution of this.  If adenopathy remains enlarged can consider biopsy.   Current Outpatient Medications:  .  acetaminophen (TYLENOL) 500 MG tablet, Take 1,000 mg every 8 (eight) hours as needed by mouth for mild pain or headache. , Disp: , Rfl:  .  atorvastatin (LIPITOR) 20 MG tablet, TAKE 1 TABLET BY MOUTH EVERY DAY, Disp: 90 tablet, Rfl: 2 .  Blood Glucose Monitoring Suppl (FIFTY50 GLUCOSE METER 2.0) w/Device KIT, Use as directed., Disp: , Rfl:  .   fluticasone (FLONASE) 50 MCG/ACT nasal spray, Place 2 sprays into both nostrils daily as needed for allergies or rhinitis. , Disp: , Rfl:  .  gabapentin (NEURONTIN) 300 MG capsule, Take 600 mg by mouth at bedtime as needed (pain in foot and ankle). , Disp: , Rfl:  .  insulin glargine, 1 Unit Dial, (TOUJEO) 300 UNIT/ML Solostar Pen, Inject 70 Units into the skin every morning. , Disp: , Rfl:  .  insulin lispro (HUMALOG) 100 UNIT/ML injection, Inject 30 Units into the skin 3 (three) times daily before meals. , Disp: , Rfl:  .  Insulin Syringe-Needle U-100 31G X 5/16" 0.3 ML MISC, use as directed, Disp: , Rfl:  .  isosorbide mononitrate (IMDUR) 30 MG 24 hr tablet, TAKE 1 TABLET BY MOUTH EVERY DAY, Disp: 90 tablet, Rfl: 3 .  losartan (COZAAR) 25 MG tablet, TAKE 1 TABLET BY MOUTH EVERY DAY, Disp: 90 tablet, Rfl: 3 .  metFORMIN (GLUCOPHAGE-XR) 500 MG 24 hr tablet, Take 500 mg by mouth daily with breakfast., Disp: , Rfl:  .  nitroGLYCERIN (NITROSTAT) 0.4 MG SL tablet, Place 1 tablet (0.4 mg total) under the tongue every 5 (five) minutes as needed for chest pain., Disp: 25 tablet, Rfl: 2 .  pantoprazole (PROTONIX) 40 MG tablet, TAKE 1 TABLET BY MOUTH EVERY DAY IN THE EVENING, Disp: 90 tablet, Rfl: 3 .  potassium chloride (KLOR-CON) 20 MEQ tablet, Take 1 tablet (20 mEq total) by mouth daily., Disp: 90 tablet, Rfl: 1 .  torsemide (DEMADEX) 20 MG tablet, Take 3 tablets (60 mg total) by mouth 2 (two) times daily. Take 40 mg BID PO M-F and Take 60 mg BID PO Saturdays and Sundays, Disp: 360 tablet, Rfl: 3 .  warfarin (COUMADIN) 4 MG tablet, Take 1 tablet (with 5 mg tablet) 2-3 times per week as directed, Disp: 30 tablet, Rfl: 1 .  warfarin (COUMADIN) 5 MG tablet, TAKE 1 TABLET (5 MG TOTAL) BY MOUTH AS DIRECTED. TAKE AS DIRECTED BY THE COUMADIN CLINIC., Disp: 60 tablet, Rfl: 1  I spent 60 minutes dedicated to the care of this patient on the date of this encounter to include pre-visit review of records, face-to-face  time with the patient discussing conditions above, post visit ordering of testing, clinical documentation with the electronic health record, making appropriate referrals as documented, and communicating necessary findings to members of the patients care team.   Garner Nash, DO [place] [facility] 01/30/2020 6:04 PM

## 2020-01-30 NOTE — Patient Instructions (Addendum)
Thank you for visiting Dr. Valeta Harms at [facility]. Today we recommend the following:  Orders Placed This Encounter  Procedures  . Pulmonary Function Test   Return in about 6 weeks (around 03/12/2020) for w/ Dr. Silas Flood .   He also needs and appt with Drs. Arn Medal or Olalere to discuss OSA.     Please do your part to reduce the spread of COVID-19.

## 2020-02-04 ENCOUNTER — Other Ambulatory Visit: Payer: Self-pay

## 2020-02-04 ENCOUNTER — Ambulatory Visit (INDEPENDENT_AMBULATORY_CARE_PROVIDER_SITE_OTHER): Payer: Medicare HMO

## 2020-02-04 DIAGNOSIS — I480 Paroxysmal atrial fibrillation: Secondary | ICD-10-CM

## 2020-02-04 DIAGNOSIS — Z7901 Long term (current) use of anticoagulants: Secondary | ICD-10-CM

## 2020-02-04 DIAGNOSIS — Z5181 Encounter for therapeutic drug level monitoring: Secondary | ICD-10-CM

## 2020-02-04 DIAGNOSIS — I2119 ST elevation (STEMI) myocardial infarction involving other coronary artery of inferior wall: Secondary | ICD-10-CM

## 2020-02-04 DIAGNOSIS — Z86718 Personal history of other venous thrombosis and embolism: Secondary | ICD-10-CM

## 2020-02-04 DIAGNOSIS — Z86711 Personal history of pulmonary embolism: Secondary | ICD-10-CM

## 2020-02-04 LAB — POCT INR: INR: 3.1 — AB (ref 2.0–3.0)

## 2020-02-04 LAB — BASIC METABOLIC PANEL
BUN/Creatinine Ratio: 28 — ABNORMAL HIGH (ref 10–24)
BUN: 40 mg/dL — ABNORMAL HIGH (ref 8–27)
CO2: 27 mmol/L (ref 20–29)
Calcium: 9.5 mg/dL (ref 8.6–10.2)
Chloride: 102 mmol/L (ref 96–106)
Creatinine, Ser: 1.44 mg/dL — ABNORMAL HIGH (ref 0.76–1.27)
GFR calc Af Amer: 58 mL/min/{1.73_m2} — ABNORMAL LOW (ref >59)
GFR calc non Af Amer: 51 mL/min/{1.73_m2} — ABNORMAL LOW (ref >59)
Glucose: 129 mg/dL — ABNORMAL HIGH (ref 65–99)
Potassium: 4.5 mmol/L (ref 3.5–5.2)
Sodium: 142 mmol/L (ref 134–144)

## 2020-02-04 NOTE — Progress Notes (Signed)
Stable renal function and electrolyte

## 2020-02-04 NOTE — Patient Instructions (Signed)
-   have a serving of greens today - continue dosage of 9 mg every day EXCEPT 5 mg on [place]. - recheck in 6 weeks

## 2020-02-11 ENCOUNTER — Emergency Department (HOSPITAL_COMMUNITY): Payer: Medicare HMO

## 2020-02-11 ENCOUNTER — Inpatient Hospital Stay (HOSPITAL_COMMUNITY)
Admission: EM | Admit: 2020-02-11 | Discharge: 2020-02-16 | DRG: 177 | Disposition: A | Discharge status: Home / Self Care | Payer: Medicare HMO | Attending: Student in an Organized Health Care Education/Training Program | Admitting: Student in an Organized Health Care Education/Training Program

## 2020-02-11 ENCOUNTER — Other Ambulatory Visit: Payer: Self-pay

## 2020-02-11 DIAGNOSIS — U071 COVID-19: Secondary | ICD-10-CM | POA: Diagnosis not present

## 2020-02-11 DIAGNOSIS — J1282 Pneumonia due to coronavirus disease 2019: Secondary | ICD-10-CM | POA: Diagnosis present

## 2020-02-11 DIAGNOSIS — A0839 Other viral enteritis: Secondary | ICD-10-CM | POA: Diagnosis present

## 2020-02-11 DIAGNOSIS — E785 Hyperlipidemia, unspecified: Secondary | ICD-10-CM | POA: Diagnosis present

## 2020-02-11 DIAGNOSIS — J9601 Acute respiratory failure with hypoxia: Secondary | ICD-10-CM

## 2020-02-11 DIAGNOSIS — I272 Pulmonary hypertension, unspecified: Secondary | ICD-10-CM | POA: Diagnosis present

## 2020-02-11 DIAGNOSIS — E1122 Type 2 diabetes mellitus with diabetic chronic kidney disease: Secondary | ICD-10-CM | POA: Diagnosis present

## 2020-02-11 DIAGNOSIS — I130 Hypertensive heart and chronic kidney disease with heart failure and stage 1 through stage 4 chronic kidney disease, or unspecified chronic kidney disease: Secondary | ICD-10-CM | POA: Diagnosis present

## 2020-02-11 DIAGNOSIS — N183 Type 2 diabetes mellitus with diabetic chronic kidney disease: Secondary | ICD-10-CM | POA: Diagnosis present

## 2020-02-11 DIAGNOSIS — R791 Abnormal coagulation profile: Secondary | ICD-10-CM | POA: Diagnosis present

## 2020-02-11 DIAGNOSIS — I5023 Acute on chronic systolic (congestive) heart failure: Secondary | ICD-10-CM | POA: Diagnosis present

## 2020-02-11 DIAGNOSIS — Z7901 Long term (current) use of anticoagulants: Secondary | ICD-10-CM

## 2020-02-11 DIAGNOSIS — Z86711 Personal history of pulmonary embolism: Secondary | ICD-10-CM

## 2020-02-11 DIAGNOSIS — I878 Other specified disorders of veins: Secondary | ICD-10-CM | POA: Diagnosis present

## 2020-02-11 DIAGNOSIS — I5033 Acute on chronic diastolic (congestive) heart failure: Secondary | ICD-10-CM

## 2020-02-11 DIAGNOSIS — J9621 Acute and chronic respiratory failure with hypoxia: Secondary | ICD-10-CM | POA: Diagnosis present

## 2020-02-11 DIAGNOSIS — Z888 Allergy status to other drugs, medicaments and biological substances status: Secondary | ICD-10-CM

## 2020-02-11 DIAGNOSIS — E1165 Type 2 diabetes mellitus with hyperglycemia: Secondary | ICD-10-CM | POA: Diagnosis present

## 2020-02-11 DIAGNOSIS — Z79899 Other long term (current) drug therapy: Secondary | ICD-10-CM

## 2020-02-11 DIAGNOSIS — R001 Bradycardia, unspecified: Secondary | ICD-10-CM | POA: Diagnosis present

## 2020-02-11 DIAGNOSIS — K219 Gastro-esophageal reflux disease without esophagitis: Secondary | ICD-10-CM | POA: Diagnosis present

## 2020-02-11 DIAGNOSIS — E1142 Type 2 diabetes mellitus with diabetic polyneuropathy: Secondary | ICD-10-CM | POA: Diagnosis present

## 2020-02-11 DIAGNOSIS — Z951 Presence of aortocoronary bypass graft: Secondary | ICD-10-CM

## 2020-02-11 DIAGNOSIS — E1151 Type 2 diabetes mellitus with diabetic peripheral angiopathy without gangrene: Secondary | ICD-10-CM | POA: Diagnosis present

## 2020-02-11 DIAGNOSIS — Z7984 Long term (current) use of oral hypoglycemic drugs: Secondary | ICD-10-CM

## 2020-02-11 DIAGNOSIS — Z6836 Body mass index (BMI) 36.0-36.9, adult: Secondary | ICD-10-CM

## 2020-02-11 DIAGNOSIS — G4733 Obstructive sleep apnea (adult) (pediatric): Secondary | ICD-10-CM | POA: Diagnosis present

## 2020-02-11 DIAGNOSIS — IMO0002 Reserved for concepts with insufficient information to code with codable children: Secondary | ICD-10-CM

## 2020-02-11 DIAGNOSIS — I480 Paroxysmal atrial fibrillation: Secondary | ICD-10-CM | POA: Diagnosis present

## 2020-02-11 DIAGNOSIS — J440 Chronic obstructive pulmonary disease with acute lower respiratory infection: Secondary | ICD-10-CM | POA: Diagnosis present

## 2020-02-11 DIAGNOSIS — Z89512 Acquired absence of left leg below knee: Secondary | ICD-10-CM

## 2020-02-11 DIAGNOSIS — Z77098 Contact with and (suspected) exposure to other hazardous, chiefly nonmedicinal, chemicals: Secondary | ICD-10-CM | POA: Diagnosis present

## 2020-02-11 DIAGNOSIS — Z86718 Personal history of other venous thrombosis and embolism: Secondary | ICD-10-CM

## 2020-02-11 DIAGNOSIS — K429 Umbilical hernia without obstruction or gangrene: Secondary | ICD-10-CM | POA: Diagnosis present

## 2020-02-11 DIAGNOSIS — I2510 Atherosclerotic heart disease of native coronary artery without angina pectoris: Secondary | ICD-10-CM | POA: Diagnosis present

## 2020-02-11 DIAGNOSIS — Z955 Presence of coronary angioplasty implant and graft: Secondary | ICD-10-CM

## 2020-02-11 DIAGNOSIS — N1831 Chronic kidney disease, stage 3a: Secondary | ICD-10-CM | POA: Diagnosis present

## 2020-02-11 DIAGNOSIS — E669 Obesity, unspecified: Secondary | ICD-10-CM | POA: Diagnosis present

## 2020-02-11 DIAGNOSIS — E119 Type 2 diabetes mellitus without complications: Secondary | ICD-10-CM

## 2020-02-11 DIAGNOSIS — I252 Old myocardial infarction: Secondary | ICD-10-CM

## 2020-02-11 DIAGNOSIS — Z8249 Family history of ischemic heart disease and other diseases of the circulatory system: Secondary | ICD-10-CM

## 2020-02-11 DIAGNOSIS — Z794 Long term (current) use of insulin: Secondary | ICD-10-CM

## 2020-02-11 LAB — BASIC METABOLIC PANEL
Anion gap: 10 (ref 5–15)
BUN: 27 mg/dL — ABNORMAL HIGH (ref 8–23)
CO2: 26 mmol/L (ref 22–32)
Calcium: 9.3 mg/dL (ref 8.9–10.3)
Chloride: 104 mmol/L (ref 98–111)
Creatinine, Ser: 1.37 mg/dL — ABNORMAL HIGH (ref 0.61–1.24)
GFR, Estimated: 57 mL/min — ABNORMAL LOW (ref >60)
Glucose, Bld: 110 mg/dL — ABNORMAL HIGH (ref 70–99)
Potassium: 4.2 mmol/L (ref 3.5–5.1)
Sodium: 140 mmol/L (ref 135–145)

## 2020-02-11 LAB — CBC
HCT: 48.9 % (ref 39.0–52.0)
Hemoglobin: 15.5 g/dL (ref 13.0–17.0)
MCH: 27.9 pg (ref 26.0–34.0)
MCHC: 31.7 g/dL (ref 30.0–36.0)
MCV: 87.9 fL (ref 80.0–100.0)
Platelets: 146 10*3/uL — ABNORMAL LOW (ref 150–400)
RBC: 5.56 MIL/uL (ref 4.22–5.81)
RDW: 15.1 % (ref 11.5–15.5)
WBC: 6 10*3/uL (ref 4.0–10.5)
nRBC: 0 % (ref 0.0–0.2)

## 2020-02-11 LAB — TROPONIN I (HIGH SENSITIVITY)
Troponin I (High Sensitivity): 20 ng/L — ABNORMAL HIGH (ref <18)
Troponin I (High Sensitivity): 20 ng/L — ABNORMAL HIGH (ref <18)

## 2020-02-11 NOTE — ED Triage Notes (Signed)
Pt c/o generalized bodyaches, weakness, dizziness. Reports SOB for the last several weeks increasing. Denies recent fever. VSS in triage, NAD at present.

## 2020-02-12 ENCOUNTER — Other Ambulatory Visit: Payer: Self-pay

## 2020-02-12 DIAGNOSIS — I272 Pulmonary hypertension, unspecified: Secondary | ICD-10-CM | POA: Diagnosis present

## 2020-02-12 DIAGNOSIS — N1831 Chronic kidney disease, stage 3a: Secondary | ICD-10-CM | POA: Diagnosis present

## 2020-02-12 DIAGNOSIS — U071 COVID-19: Secondary | ICD-10-CM | POA: Diagnosis present

## 2020-02-12 DIAGNOSIS — R0602 Shortness of breath: Secondary | ICD-10-CM | POA: Diagnosis not present

## 2020-02-12 DIAGNOSIS — E1122 Type 2 diabetes mellitus with diabetic chronic kidney disease: Secondary | ICD-10-CM | POA: Diagnosis present

## 2020-02-12 DIAGNOSIS — J1282 Pneumonia due to coronavirus disease 2019: Secondary | ICD-10-CM | POA: Diagnosis present

## 2020-02-12 DIAGNOSIS — A0839 Other viral enteritis: Secondary | ICD-10-CM | POA: Diagnosis present

## 2020-02-12 DIAGNOSIS — K219 Gastro-esophageal reflux disease without esophagitis: Secondary | ICD-10-CM | POA: Diagnosis present

## 2020-02-12 DIAGNOSIS — I252 Old myocardial infarction: Secondary | ICD-10-CM | POA: Diagnosis not present

## 2020-02-12 DIAGNOSIS — E785 Hyperlipidemia, unspecified: Secondary | ICD-10-CM | POA: Diagnosis present

## 2020-02-12 DIAGNOSIS — I5023 Acute on chronic systolic (congestive) heart failure: Secondary | ICD-10-CM | POA: Diagnosis present

## 2020-02-12 DIAGNOSIS — K429 Umbilical hernia without obstruction or gangrene: Secondary | ICD-10-CM | POA: Diagnosis present

## 2020-02-12 DIAGNOSIS — I878 Other specified disorders of veins: Secondary | ICD-10-CM | POA: Diagnosis present

## 2020-02-12 DIAGNOSIS — I480 Paroxysmal atrial fibrillation: Secondary | ICD-10-CM | POA: Diagnosis present

## 2020-02-12 DIAGNOSIS — E669 Obesity, unspecified: Secondary | ICD-10-CM | POA: Diagnosis present

## 2020-02-12 DIAGNOSIS — G4733 Obstructive sleep apnea (adult) (pediatric): Secondary | ICD-10-CM | POA: Diagnosis present

## 2020-02-12 DIAGNOSIS — J9621 Acute and chronic respiratory failure with hypoxia: Secondary | ICD-10-CM | POA: Diagnosis present

## 2020-02-12 DIAGNOSIS — R791 Abnormal coagulation profile: Secondary | ICD-10-CM | POA: Diagnosis present

## 2020-02-12 DIAGNOSIS — E1151 Type 2 diabetes mellitus with diabetic peripheral angiopathy without gangrene: Secondary | ICD-10-CM | POA: Diagnosis present

## 2020-02-12 DIAGNOSIS — J440 Chronic obstructive pulmonary disease with acute lower respiratory infection: Secondary | ICD-10-CM | POA: Diagnosis present

## 2020-02-12 DIAGNOSIS — I2510 Atherosclerotic heart disease of native coronary artery without angina pectoris: Secondary | ICD-10-CM | POA: Diagnosis present

## 2020-02-12 DIAGNOSIS — E1165 Type 2 diabetes mellitus with hyperglycemia: Secondary | ICD-10-CM | POA: Diagnosis present

## 2020-02-12 DIAGNOSIS — E1142 Type 2 diabetes mellitus with diabetic polyneuropathy: Secondary | ICD-10-CM | POA: Diagnosis present

## 2020-02-12 DIAGNOSIS — I130 Hypertensive heart and chronic kidney disease with heart failure and stage 1 through stage 4 chronic kidney disease, or unspecified chronic kidney disease: Secondary | ICD-10-CM | POA: Diagnosis present

## 2020-02-12 DIAGNOSIS — I361 Nonrheumatic tricuspid (valve) insufficiency: Secondary | ICD-10-CM | POA: Diagnosis not present

## 2020-02-12 DIAGNOSIS — R001 Bradycardia, unspecified: Secondary | ICD-10-CM | POA: Diagnosis present

## 2020-02-12 LAB — C-REACTIVE PROTEIN: CRP: 0.9 mg/dL (ref <1.0)

## 2020-02-12 LAB — URINALYSIS, ROUTINE W REFLEX MICROSCOPIC
Bacteria, UA: NONE SEEN
Bilirubin Urine: NEGATIVE
Glucose, UA: NEGATIVE mg/dL
Ketones, ur: NEGATIVE mg/dL
Leukocytes,Ua: NEGATIVE
Nitrite: NEGATIVE
Protein, ur: NEGATIVE mg/dL
Specific Gravity, Urine: 1.008 (ref 1.005–1.030)
pH: 6 (ref 5.0–8.0)

## 2020-02-12 LAB — LACTIC ACID, PLASMA: Lactic Acid, Venous: 1.1 mmol/L (ref 0.5–1.9)

## 2020-02-12 LAB — FIBRINOGEN: Fibrinogen: 468 mg/dL (ref 210–475)

## 2020-02-12 LAB — PROCALCITONIN: Procalcitonin: <0.1 ng/mL

## 2020-02-12 LAB — D-DIMER, QUANTITATIVE: D-Dimer, Quant: <0.27 ug/mL-FEU (ref 0.00–0.50)

## 2020-02-12 LAB — HEPATIC FUNCTION PANEL
ALT: 24 U/L (ref 0–44)
AST: 20 U/L (ref 15–41)
Albumin: 3.3 g/dL — ABNORMAL LOW (ref 3.5–5.0)
Alkaline Phosphatase: 52 U/L (ref 38–126)
Bilirubin, Direct: 0.3 mg/dL — ABNORMAL HIGH (ref 0.0–0.2)
Indirect Bilirubin: 0.8 mg/dL (ref 0.3–0.9)
Total Bilirubin: 1.1 mg/dL (ref 0.3–1.2)
Total Protein: 6.6 g/dL (ref 6.5–8.1)

## 2020-02-12 LAB — RESP PANEL BY RT-PCR (FLU A&B, COVID) ARPGX2
Influenza A by PCR: NEGATIVE
Influenza B by PCR: NEGATIVE
SARS Coronavirus 2 by RT PCR: POSITIVE — AB

## 2020-02-12 LAB — PROTIME-INR
INR: 2 — ABNORMAL HIGH (ref 0.8–1.2)
Prothrombin Time: 22 seconds — ABNORMAL HIGH (ref 11.4–15.2)

## 2020-02-12 LAB — BRAIN NATRIURETIC PEPTIDE: B Natriuretic Peptide: 246.6 pg/mL — ABNORMAL HIGH (ref 0.0–100.0)

## 2020-02-12 LAB — LACTATE DEHYDROGENASE: LDH: 206 U/L — ABNORMAL HIGH (ref 98–192)

## 2020-02-12 LAB — HIV ANTIBODY (ROUTINE TESTING W REFLEX): HIV Screen 4th Generation wRfx: NONREACTIVE

## 2020-02-12 LAB — CBG MONITORING, ED
Glucose-Capillary: 358 mg/dL — ABNORMAL HIGH (ref 70–99)
Glucose-Capillary: 365 mg/dL — ABNORMAL HIGH (ref 70–99)

## 2020-02-12 LAB — FERRITIN: Ferritin: 45 ng/mL (ref 24–336)

## 2020-02-12 LAB — TRIGLYCERIDES: Triglycerides: 81 mg/dL (ref <150)

## 2020-02-12 MED ORDER — GABAPENTIN 300 MG PO CAPS: 600.0000 mg | ORAL_CAPSULE | Freq: Every evening | ORAL | Status: DC | PRN

## 2020-02-12 MED ORDER — ACETAMINOPHEN 325 MG PO TABS
650.0000 mg | ORAL_TABLET | Freq: Four times a day (QID) | ORAL | Status: DC | PRN
  Administered 2020-02-12 – 2020-02-16 (×6): 650 mg via ORAL
  Filled 2020-02-12 (×6): qty 2

## 2020-02-12 MED ORDER — ONDANSETRON HCL 4 MG/2ML IJ SOLN: 4.0000 mg | Freq: Four times a day (QID) | INTRAMUSCULAR | Status: DC | PRN

## 2020-02-12 MED ORDER — ONDANSETRON HCL 4 MG PO TABS: 4.0000 mg | ORAL_TABLET | Freq: Four times a day (QID) | ORAL | Status: DC | PRN

## 2020-02-12 MED ORDER — GUAIFENESIN-DM 100-10 MG/5ML PO SYRP
10.0000 mL | ORAL_SOLUTION | ORAL | Status: DC | PRN
  Administered 2020-02-14: 10 mL via ORAL
  Filled 2020-02-12: qty 10

## 2020-02-12 MED ORDER — DEXAMETHASONE 4 MG PO TABS
6.0000 mg | ORAL_TABLET | ORAL | Status: DC
  Administered 2020-02-13 – 2020-02-16 (×4): 6 mg via ORAL
  Filled 2020-02-12 (×4): qty 2

## 2020-02-12 MED ORDER — INSULIN ASPART 100 UNIT/ML ~~LOC~~ SOLN
0.0000 [IU] | Freq: Three times a day (TID) | SUBCUTANEOUS | Status: DC
  Administered 2020-02-12: 20:00:00 15 [IU] via SUBCUTANEOUS
  Administered 2020-02-13: 11 [IU] via SUBCUTANEOUS
  Administered 2020-02-13 (×2): 5 [IU] via SUBCUTANEOUS
  Administered 2020-02-13: 8 [IU] via SUBCUTANEOUS

## 2020-02-12 MED ORDER — LOSARTAN POTASSIUM 25 MG PO TABS
25.0000 mg | ORAL_TABLET | Freq: Every day | ORAL | Status: DC
  Administered 2020-02-12 – 2020-02-16 (×5): 25 mg via ORAL
  Filled 2020-02-12 (×5): qty 1

## 2020-02-12 MED ORDER — ACETAMINOPHEN 325 MG PO TABS
650.0000 mg | ORAL_TABLET | Freq: Once | ORAL | Status: AC
  Administered 2020-02-12: 08:00:00 650 mg via ORAL
  Filled 2020-02-12: qty 2

## 2020-02-12 MED ORDER — INSULIN ASPART 100 UNIT/ML ~~LOC~~ SOLN
6.0000 [IU] | Freq: Three times a day (TID) | SUBCUTANEOUS | Status: DC
  Administered 2020-02-12 – 2020-02-13 (×4): 6 [IU] via SUBCUTANEOUS

## 2020-02-12 MED ORDER — WARFARIN SODIUM 6 MG PO TABS
9.0000 mg | ORAL_TABLET | Freq: Once | ORAL | Status: AC
  Administered 2020-02-12: 18:00:00 9 mg via ORAL
  Filled 2020-02-12: qty 1

## 2020-02-12 MED ORDER — POLYETHYLENE GLYCOL 3350 17 G PO PACK
17.0000 g | PACK | Freq: Every day | ORAL | Status: DC | PRN
  Administered 2020-02-15: 17 g via ORAL
  Filled 2020-02-12: qty 1

## 2020-02-12 MED ORDER — FUROSEMIDE 10 MG/ML IJ SOLN
80.0000 mg | Freq: Once | INTRAMUSCULAR | Status: AC
  Administered 2020-02-12: 08:00:00 80 mg via INTRAVENOUS
  Filled 2020-02-12: qty 8

## 2020-02-12 MED ORDER — INSULIN GLARGINE 100 UNIT/ML ~~LOC~~ SOLN
60.0000 [IU] | Freq: Once | SUBCUTANEOUS | Status: AC
  Administered 2020-02-12: 20:00:00 60 [IU] via SUBCUTANEOUS
  Filled 2020-02-12: qty 0.6

## 2020-02-12 MED ORDER — ISOSORBIDE MONONITRATE ER 30 MG PO TB24
30.0000 mg | ORAL_TABLET | Freq: Every day | ORAL | Status: DC
  Administered 2020-02-12 – 2020-02-16 (×5): 30 mg via ORAL
  Filled 2020-02-12 (×5): qty 1

## 2020-02-12 MED ORDER — PANTOPRAZOLE SODIUM 40 MG PO TBEC
40.0000 mg | DELAYED_RELEASE_TABLET | Freq: Every day | ORAL | Status: DC
  Administered 2020-02-12 – 2020-02-15 (×4): 40 mg via ORAL
  Filled 2020-02-12 (×4): qty 1

## 2020-02-12 MED ORDER — WARFARIN - PHARMACIST DOSING INPATIENT: Freq: Every day | Status: DC

## 2020-02-12 MED ORDER — ATORVASTATIN CALCIUM 10 MG PO TABS
20.0000 mg | ORAL_TABLET | Freq: Every day | ORAL | Status: DC
  Administered 2020-02-12 – 2020-02-16 (×5): 20 mg via ORAL
  Filled 2020-02-12 (×5): qty 2

## 2020-02-12 MED ORDER — DEXAMETHASONE SODIUM PHOSPHATE 10 MG/ML IJ SOLN
6.0000 mg | Freq: Once | INTRAMUSCULAR | Status: AC
  Administered 2020-02-12: 09:00:00 6 mg via INTRAVENOUS
  Filled 2020-02-12: qty 1

## 2020-02-12 MED ORDER — POTASSIUM CHLORIDE CRYS ER 20 MEQ PO TBCR
20.0000 meq | EXTENDED_RELEASE_TABLET | Freq: Every day | ORAL | Status: DC
  Administered 2020-02-12 – 2020-02-16 (×5): 20 meq via ORAL
  Filled 2020-02-12 (×5): qty 1

## 2020-02-12 NOTE — H&P (Addendum)
Date: 02/12/2020               Patient Name:  Bobby Lindsey MRN: [id number]  DOB: 1955-03-18 Age / Sex: 64 y.o., male   PCP: Adin Hector, MD         Medical Service: Internal Medicine Teaching Service         Attending Physician: Dr. Lalla Brothers    First Contact: Dr. Konrad Penta Pager: [phone]  Second Contact: Dr. Laural Golden Pager: [phone]       After Hours (After 5p/  First Contact Pager: [phone]  weekends / holidays): Second Contact Pager: [phone]   Chief Complaint: Shortness of breath  History of Present Illness:   Mr. Warriner is a 64 y.o. 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 His SOB is worse with exertion and he states he is unable to lay flat at night, having to sit up in bed. He notes worsening swelling and tightness in his right leg and abdomen. He has been having diffuse abdominal pain he describes as a "burning" worst in his lower abdomen. He attributes his pain to a hernia that he has. Also notes constipation recently, stating sometimes he goes 5 days between bowel movements, which is unusual for him. He has had dark, watery diarrhea over the past 1-2 days. He endorses associated rectal pain, but denies any obvious blood in his stools.  Patient reports he has been having trouble breathing for the past few weeks. He noticed he could not walk as far without stopping to catch his breath. He states he has had coughing since yesterday, coughing up yellow phlegm. His nose feels dry. He did have some mild left-sided CP yesterday at rest, lasting seconds at a time, not worse with exertion, palpation, movement, or eating. He has had this pain in the past. States he has been urinating less than usual with mild  decrease in appetite although continues to tolerate PO intake. Denies hemoptysis, epistaxis, nausea, vomiting, or any other symptoms. He denies any history of lung disease although notes that he occasionally uses oxygen at home, mostly at night, 4-5 liters.   Home Medications: Tylenol 1000g q8 hours Lipitor 20mg  daily Flonase 2 sprays daily PRN  Gabapentin 600mg  nightly PRN Insulin Glargin 80 units daily before breakfast Insulin lispro 20-30 units TID before meals Metformin 500mg  daily with breakfast  Freestyle Libre Sensor  IMDUR 30mg  daily  Losartan 25mg  daily  Klor-con 25mEq M-F and ?44mEq Sat/Sun Torsemide 60mg  twice daily  NTG SL tablet 0.4mg  PRN Warfarin 4mg  daily except for Friday at night + 5mg  as directed by Coumadin clinic  Protonix 40mg  nightly  CBD oil 25mg  every morning  Fish Oil 1200mg  every morning   Allergies: Allergies as of 02/11/2020 - Review Complete 01/30/2020  Allergen Reaction Noted   Simvastatin Diarrhea and Other (See Comments) 04/13/2014   Past Medical History:  Diagnosis Date   Anginal pain ([place])    last pm   Atrial fibrillation ([place])    a. Transient during 03/2013 admission.   Bradycardia    a. Bradycardia/pauses/possible Mobitz II during 03/2013 adm. Not on BB due to this.   CAD (coronary artery disease)    a. s/p CABG 2002. b. Hx Cypher stent to  the RCA. c. Inf-lat STEMI 03/2013:  LHC (04/05/13):  mLAD occluded, pD1 90, apical br of Dx occluded, CFX occluded, pOM1 90-95, RCA stents patent, diff RCA 30, S-Dx occluded, S-PDA occluded, S-OM1 40-50, L-LAD patent, EF 40% with inf HK.  PCI:  Promus (2.5 x 28) DES to mid to dist CFX.   Charcot's joint of knee    COPD (chronic obstructive pulmonary disease) (HCC)    Deep venous thrombosis ([place])    right lower extremity   Diabetes mellitus    a. A1C 10.7 in 03/2013.   Diastolic CHF ([place])    a. EF 40% by cath, 55-60% during 03/2013 adm, required IV diuresis.   DVT, lower extremity, recurrent ([place])    a. Hx  recurrent DVT per record.   Dyslipidemia    Elevated CK    a. Pt has refused rheum workup in the past.   GERD (gastroesophageal reflux disease)    History of epidermal inclusion cyst excision 07/09/2018   History of hiatal hernia    HTN (hypertension)    x 15 years   Hx of cardiovascular stress test    a. Lexiscan Myoview (03/2010):  diaph atten vs inf scar, no ischemia, EF 47%; Low Risk.   Hx of echocardiogram    a. Echo (04/08/13):  Mild LVH, EF 55-60%, restrictive physiology, severe LAE, mild reduced RVSF, mild RAE.   Leg pain    ABI 6/16:  R 1.2, L 1.1 - normal   Myocardial infarction (HCC)    Obesity    Peripheral neuropathy    Pulmonary embolism (HCC)    Sleep apnea     Family History:  Family History  Problem Relation Age of Onset   Heart disease Mother    Hypertension Mother    Cancer Father    Hypertension Sister    Heart attack Neg Hx    Stroke Neg Hx    Social History: Lives at home with his wife. States that he has never used tobacco products, denies current or previous alcohol use, or any illicit drug use.   Review of Systems: A complete ROS was negative except as per HPI.   Physical Exam: Blood pressure (!) 146/118, pulse 69, temperature 99 F (37.2 C), temperature source Oral, resp. rate 18, SpO2 93 %.  General: Patient is obese and appears somewhat ill in mild acute distress. Eyes: Sclera non-icteric. No conjunctival injection.  HENT: Neck is supple. No epistaxis. Mildly dry mucus membranes.  Respiratory: Lungs are CTA, bilaterally. No wheezes, rales, or rhonchi appreciated. Patient is tachypneic, saturating 88-94% on room air with increased work of breathing and mild accessory muscle use.  Cardiovascular: Regular rate and rhythm. No murmurs, rubs, or gallops. Patient has 1+ LE pitting edema of the RLE.  Musculoskeletal: Left leg amputated below the knee, without tenderness. Right leg with mild TTP diffusely and erythema/swelling of the right foot. Charcot  foot present on the right. Neurological: Patient is alert and oriented x 3. Sensation to light touch is absent below the knee on the RLE.  Abdominal: Significant distention. Slightly firm, although without tenderness to palpation, guarding or rebound. Bowel sounds intact. There is a firm, but reproducible, non-tender umbilical hernia.  Skin: There is scattered erythema of the bilateral lower extremities, flaking of the superficial skin of the right shin, although without purulence, excessive warmth, or active drainage. Hands appear erythematous and skin appears mildly jaundiced.  Psych: Normal affect. Normal tone of voice.   EKG: personally reviewed my interpretation is  NSR. Difficult to interpret due to significant artifact. Q waves in inferior leads suggestive of prior ischemia with RAD and prolonged QTc.  CXR: personally reviewed my interpretation is cardiomegaly with peribronchial thickening and interstitial prominence with sternal wires in place. Concerning for pulmonary edema with prior sternotomy.  Assessment & Plan by Problem: Principal Problem:   COVID-19 Active Problems:   Diabetes (HCC)   Paroxysmal atrial fibrillation (HCC)   Acute on chronic HFrEF (heart failure with reduced ejection fraction) (HCC)   Stage 3 chronic kidney disease due to diabetes mellitus ([place])  # Acute on Chronic Hypoxic Respiratory Failure due to COVID-19 Patient presents with worsening SOB over the past several weeks (acutely worse yesterday) with worsening abdominal distention and LE edema. Oxygen saturations are 88-94% on room air, although patient is tachypneic with increased WOB and accessory muscle use. CXR showed chronic cardiomegaly and mild pulmonary congestion. Acute worsening of SOB likely 2/2 COVID-19 infection, although CHF exacerbation, pulmonary HTN, and less likely COPD may be contributing. See below for details. - Continue supplemental O2 as needed with goal SpO2 88-92%  - Incentive  spirometry - Continue pulse oximetry  - Blood cultures pending   # Covid-19 Infection Patient took home test that was positive for COVID-19 infection yesterday due to sudden worsening of SOB with worsening fatigue and cough. Confirmed positive PCR in the ED. Patient had not been vaccinated although is interested in future vaccination.  - Airborne and contact precautions - Continue to monitor inflammatory markers daily - Procalcitonin pending - Continue Dexamethasone 6mg  daily (Day 1/10)  - Robitussin q4 hours PRN for cough - Tylenol 650mg  q6 hours PRN for pain/fevers  # Possible New-Onset CHF Last ECHO 02/20/19 showed EF 55-60% with preserved LV function, although diastolic function could not be evaluated. Mild LA dilation and moderate RA dilation. Chamber dilation also noted on previous [facility], although patient has not had documented impaired LV or RV chamber dysfunction. Patient was given Lasix 80mg  once in the ED and was started on  - Continue home torsemide 60mg  twice daily  - Ordered ECHO - Continuous telemetry  - Daily Weights - Strict I&O   # Pulmonary HTN  Last ECHO 02/20/19 showed severely elevated pulmonary artery systolic pressure, progressed since ECHO in 2020, with dilated IVC.  - Ordered ECHO  # CKD Stage IIIa, Stable  Creatinine 1.37, at baseline since 02/2019. Likely due to long history of IDDM type II. Also may have prerenal component given patient complaining of thirst with slightly decreased PO intake over last day.  - Continue home Klor-Con CR 56mEq daily  - Continue to monitor renal function  - Avoid nephrotoxic medications  # IDDM Type 2 complicated by Chronic Neuropathy, ED, L BKA Last Hb A1c in January 2021 was 9.6. Patient has long-standing history of uncontrolled IDDM type II with neuropathy and numbness to R knee with L BKA. Takes Lantus 80 units every morning before breakfast and 20-30 units Novolog three times daily with meals at home. Patient's wife helps him  administer his insulins. Unable to confirm doses.  - Will give 60 units lantus tonight, and follow up CBG's - Continue home gabapentin 600mg  nightly PRN   # CAD s/p CABG in 2002 and STEMI in 2015 Patient does complain of L-sided CP, although CP has been ongoing, lasts only seconds at a time, and occurs at rest. Would be atypical for angina, although does have a hx of stenting. Troponin's flat at 20 x 3. EKG shows findings consistent with  prior inferior infarct (has hx RCA infarct), although no new findings concerning for acute ischemia; however, interpretation limited due to significant artifact. Last had DES placed in the mid-distal circumflex artery in 2015.  - Will repeat EKG - Continue to monitor for S&S of CP   # Hx A-Fib / A-Flutter / Bradycardia  # Hx Recurrent DVT/PE Patient takes Warfarin (~9mg  daily) per coumadin clinic at home. INR 3.1. In NSR during admission although with intermittent bradycardia. Has had a couple of cardioversions within the past 2 years.  - Continue Warfarin, dosing per pharmacy - Continuous telemetry   # HTN Blood pressures highly variable during admission, generally elevated. Likely in setting of acute illness.  - Continue home Imdur 30mg  daily - Continue home losartan 25mg  daily   # HLD  Has family hypercholesterolemia disorder per records, although cholesterol under good control as of 09/30/19. - Continue atorvastatin 20mg  daily for now  # Hx of Constipation Patient endorses constipation recently with stools every 5 days, diarrhea over the past couple of days, with dark stools and some rectal pain although no frank blood.  - Miralax daily PRN   Code Status: Full Code  Diet: Heart Healthy / Carb Modified  IVF: None  DVT PPx: On Warfarin  Dispo: Admit patient to Inpatient with expected length of stay greater than 2 midnights.  Signed: Jeralyn Bennett, MD 02/12/2020, 6:46 PM  Pager: [phone] After 5pm on weekdays and 1pm on weekends: On Call  pager: [phone]

## 2020-02-12 NOTE — ED Notes (Signed)
Pt urinated in the bed. Bed cleaned and fresh linens applied to bed. Pt educated on the use of the urinal.

## 2020-02-12 NOTE — Progress Notes (Signed)
[place] for Warfarin Indication: Hx of PE, DVT, and afib  Allergies  Allergen Reactions  . Other Other (See Comments)    Steroids- "BGL goes SKY-HIGH"  . Simvastatin Diarrhea and Other (See Comments)    Fatigue, also      Patient Measurements:   Heparin Dosing Weight:   Vital Signs: Temp: 99 F (37.2 C) (12/30 0854) Temp Source: Oral (12/30 0854) BP: 144/76 (12/30 1230) Pulse Rate: 59 (12/30 1230)  Labs: Recent Labs    02/11/20 1810 02/11/20 2055 02/12/20 0652  HGB 15.5  --   --   HCT 48.9  --   --   PLT 146*  --   --   LABPROT  --   --  22.0*  INR  --   --  2.0*  CREATININE 1.37*  --   --   TROPONINIHS 20* 20*  --     Estimated Creatinine Clearance: 72.1 mL/min (A) (by C-G formula based on SCr of 1.37 mg/dL (H)).   Medical History: Past Medical History:  Diagnosis Date  . Anginal pain ([place])    last pm  . Atrial fibrillation ([place])    a. Transient during 03/2013 admission.  . Bradycardia    a. Bradycardia/pauses/possible Mobitz II during 03/2013 adm. Not on BB due to this.  Marland Kitchen CAD (coronary artery disease)    a. s/p CABG 2002. b. Hx Cypher stent to the RCA. c. Inf-lat STEMI 03/2013:  LHC (04/05/13):  mLAD occluded, pD1 90, apical br of Dx occluded, CFX occluded, pOM1 90-95, RCA stents patent, diff RCA 30, S-Dx occluded, S-PDA occluded, S-OM1 40-50, L-LAD patent, EF 40% with inf HK.  PCI:  Promus (2.5 x 28) DES to mid to dist CFX.  Marland Kitchen Charcot's joint of knee   . COPD (chronic obstructive pulmonary disease) ([place])   . Deep venous thrombosis (HCC)    right lower extremity  . Diabetes mellitus    a. A1C 10.7 in 03/2013.  . Diastolic CHF ([place])    a. EF 40% by cath, 55-60% during 03/2013 adm, required IV diuresis.  . DVT, lower extremity, recurrent ([place])    a. Hx recurrent DVT per record.  . Dyslipidemia   . Elevated CK    a. Pt has refused rheum workup in the past.  . GERD (gastroesophageal reflux disease)   . History of  epidermal inclusion cyst excision 07/09/2018  . History of hiatal hernia   . HTN (hypertension)    x 15 years  . Hx of cardiovascular stress test    a. Lexiscan Myoview (03/2010):  diaph atten vs inf scar, no ischemia, EF 47%; Low Risk.  Marland Kitchen Hx of echocardiogram    a. Echo (04/08/13):  Mild LVH, EF 55-60%, restrictive physiology, severe LAE, mild reduced RVSF, mild RAE.  . Leg pain    ABI 6/16:  R 1.2, L 1.1 - normal  . Myocardial infarction ([place])   . Obesity   . Peripheral neuropathy   . Pulmonary embolism ([place])   . Sleep apnea     Medications:  Scheduled:  . atorvastatin  20 mg Oral Daily  . [START ON 02/13/2020] dexamethasone  6 mg Oral Q24H  . isosorbide mononitrate  30 mg Oral Daily  . losartan  25 mg Oral Daily  . pantoprazole  40 mg Oral QHS  . potassium chloride SA  20 mEq Oral Daily  . warfarin  9 mg Oral ONCE-1600  . Warfarin - Pharmacist Dosing Inpatient  Does not apply q1600    Assessment: Patient is a 72 yom that is being admitted for COVID. Patient is on warfarin at home for history of a PE, DVT, and afib. Pharmacy has been asked to dose warfarin while patient is inpatient.  PTA schedule: 9 mg every day EXCEPT 5 mg on MONDAYS & FRIDAYS  Goal of Therapy:  INR 2-3 Monitor platelets by anticoagulation protocol: Yes   Plan:  - Patient last dose of warfarin was on 12/28 - INR is currently therapeutic at 2.0  - Will give Warfarin 9mg  PO x 1 dose  - Monitor pt for s/s of bleeding, PT-INR daily   Duanne Limerick PharmD. BCPS 02/12/2020,2:13 PM

## 2020-02-12 NOTE — ED Provider Notes (Signed)
[facility] EMERGENCY DEPARTMENT Provider Note   CSN: [id number] Arrival date & time: 02/11/20  1647     History Chief Complaint  Patient presents with  . Shortness of Breath    Bobby Lindsey is a 64 y.o. male.  Patient with history of diastolic congestive heart failure on torsemide, pulmonary hypertension, OSA, atrial fibrillation, coronary artery disease status post stenting, history of DVT/PE on Coumadin -- presents the emergency department today for progressively worsening shortness of breath.  Also, patient reports taking at home rapid Covid test which was positive yesterday afternoon.  Patient has a history of a previous left lower extremity amputation.  He states that the right leg has become more swollen and tight.  He also reports intermittent lower abdominal pain.  He has worsening shortness of breath, especially with ambulation.  States that he has been compliant with his medications but did not take medication last night because of waiting in the emergency department.  No fever.  Patient has occasional cough.  He has not been vaccinated for coronavirus.       Past Medical History:  Diagnosis Date  . Anginal pain ([place])    last pm  . Atrial fibrillation ([place])    a. Transient during 03/2013 admission.  . Bradycardia    a. Bradycardia/pauses/possible Mobitz II during 03/2013 adm. Not on BB due to this.  Marland Kitchen CAD (coronary artery disease)    a. s/p CABG 2002. b. Hx Cypher stent to the RCA. c. Inf-lat STEMI 03/2013:  LHC (04/05/13):  mLAD occluded, pD1 90, apical br of Dx occluded, CFX occluded, pOM1 90-95, RCA stents patent, diff RCA 30, S-Dx occluded, S-PDA occluded, S-OM1 40-50, L-LAD patent, EF 40% with inf HK.  PCI:  Promus (2.5 x 28) DES to mid to dist CFX.  Marland Kitchen Charcot's joint of knee   . COPD (chronic obstructive pulmonary disease) ([place])   . Deep venous thrombosis (HCC)    right lower extremity  . Diabetes mellitus    a. A1C 10.7 in 03/2013.  . Diastolic  CHF ([place])    a. EF 40% by cath, 55-60% during 03/2013 adm, required IV diuresis.  . DVT, lower extremity, recurrent ([employer])    a. Hx recurrent DVT per record.  . Dyslipidemia   . Elevated CK    a. Pt has refused rheum workup in the past.  . GERD (gastroesophageal reflux disease)   . History of epidermal inclusion cyst excision 07/09/2018  . History of hiatal hernia   . HTN (hypertension)    x 15 years  . Hx of cardiovascular stress test    a. Lexiscan Myoview (03/2010):  diaph atten vs inf scar, no ischemia, EF 47%; Low Risk.  Marland Kitchen Hx of echocardiogram    a. Echo (04/08/13):  Mild LVH, EF 55-60%, restrictive physiology, severe LAE, mild reduced RVSF, mild RAE.  . Leg pain    ABI 6/16:  R 1.2, L 1.1 - normal  . Myocardial infarction ([place])   . Obesity   . Peripheral neuropathy   . Pulmonary embolism ([place])   . Sleep apnea     Patient Active Problem List   Diagnosis Date Noted  . Leg swelling 09/29/2019  . CKD (chronic kidney disease), stage IV ([place]) 06/05/2019  . Acute CHF (congestive heart failure) ([place]) 02/20/2019  . Chest pain 02/20/2019  . Lightheadedness 02/20/2019  . Coronary artery disease involving coronary bypass graft of native heart without angina pectoris 01/27/2019  . Chronic diastolic HF (heart  failure) ([place]) 01/27/2019  . Educated about COVID-19 virus infection   . Atypical atrial flutter ([place])   . Cellulitis of right lower extremity   . SOB (shortness of breath) 12/06/2017  . Uncontrolled type 2 diabetes mellitus with insulin therapy ([place]) 12/06/2017  . Primary osteoarthritis of left knee 11/02/2017  . Open wound 06/05/2017  . Diabetic ulcer of left lower leg ([place]) 05/16/2017  . Acquired absence of left leg below knee ([place]) 02/28/2017  . Impaired mobility and activities of daily living 02/15/2017  . Atherosclerotic heart disease of native coronary artery without angina pectoris 01/05/2017  . Essential hypertension 12/19/2016  . Atherosclerosis of native arteries of  the extremities with ulceration ([place]) 12/19/2016  . Peripheral vascular disease ([place]) 12/07/2016  . CHF (congestive heart failure) ([place]) 11/24/2016  . Demand ischemia ([place])   . 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 Stage 3 chronic kidney disease due to diabetes mellitus ([place]) 11/23/2016  . NSTEMI (non-ST elevated myocardial infarction) ([place]) 11/23/2016  . Acute diastolic CHF (congestive heart failure) ([place]) 11/23/2016  . Acute congestive heart failure ([place])   . Burn of left foot 11/21/2016  . Diabetic peripheral neuropathy ([place]) 11/21/2016  . Diabetic ulcer of right foot associated with type 2 diabetes mellitus, with fat layer exposed ([place]) 11/21/2016  . Diabetic peripheral vascular disease ([place]) 11/21/2016  . DDD (degenerative disc disease), lumbar 03/27/2016  . History of deep vein thrombosis (DVT) of lower extremity 03/26/2016  . History of pulmonary embolism 10/01/2015  . Nephropathy, diabetic ([place]) 10/01/2015  . 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 Chronic obstructive pulmonary disease, unspecified ([place]) 12/16/2013  . Medication management 04/14/2013  . History of recurrent DVT/E 04/11/2013  . Sleep apnea 04/11/2013  . Bradycardia 04/11/2013  . History of Elevated CK level  04/11/2013  . Class 1 obesity due to excess calories with serious comorbidity and body mass index (BMI) of 34.0 to 34.9 in adult 04/11/2013  . Peripheral neuropathy ([place])   . Charcot's arthropathy   . Heart failure, unspecified ([place]) 04/08/2013  . Paroxysmal atrial fibrillation ([place]) 04/07/2013  . Reactive depression (situational) 05/16/2011  . Long term (current) use of anticoagulants 09/21/2010  . 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 ED (erectile dysfunction) of organic origin 07/28/2010  . Hyperlipidemia 05/26/2008  . Hypertensive heart disease 05/26/2008  . Diabetes ([place]) 02/14/1991    Past Surgical History:  Procedure Laterality Date  . CARDIOVERSION N/A 06/19/2018    Procedure: CARDIOVERSION;  Surgeon: Acie Fredrickson Wonda Cheng, MD;  Location: [facility] ENDOSCOPY;  Service: Cardiovascular;  Laterality: N/A;  . CARDIOVERSION N/A 02/21/2019   Procedure: CARDIOVERSION;  Surgeon: Fay Records, MD;  Location: [facility];  Service: Cardiovascular;  Laterality: N/A;  . CORONARY ANGIOPLASTY WITH STENT PLACEMENT    . CORONARY ARTERY BYPASS GRAFT     4 time since 2002  . LEFT HEART CATHETERIZATION WITH CORONARY/GRAFT ANGIOGRAM  04/05/2013   Procedure: LEFT HEART CATHETERIZATION WITH Beatrix Fetters;  Surgeon: Peter M Martinique, MD;  Location: [facility] CATH LAB;  Service: Cardiovascular;;  . left knee surgery    . LOWER EXTREMITY ANGIOGRAPHY Left 12/25/2016   Procedure: LOWER EXTREMITY ANGIOGRAPHY;  Surgeon: Algernon Huxley, MD;  Location: [facility] CV LAB;  Service: Cardiovascular;  Laterality: Left;  . PERCUTANEOUS CORONARY STENT INTERVENTION (PCI-S)  04/05/2013   Procedure: PERCUTANEOUS CORONARY STENT INTERVENTION (PCI-S);  Surgeon: Peter M Martinique, MD;  Location: [facility] CATH LAB;  Service: Cardiovascular;;  DES to native Mid cx  . VASCULAR SURGERY  Family History  Problem Relation Age of Onset  . Heart disease Mother   . Hypertension Mother   . Cancer Father   . Hypertension Sister   . Heart attack Neg Hx   . Stroke Neg Hx     Social History   Tobacco Use  . Smoking status: Never Smoker  . Smokeless tobacco: Never Used  Vaping Use  . Vaping Use: Never used  Substance Use Topics  . Alcohol use: Yes    Comment: rare  . Drug use: No    Home Medications Prior to Admission medications   Medication Sig Start Date End Date Taking? Authorizing Provider  acetaminophen (TYLENOL) 500 MG tablet Take 1,000 mg every 8 (eight) hours as needed by mouth for mild pain or headache.     [provider]  atorvastatin (LIPITOR) 20 MG tablet TAKE 1 TABLET BY MOUTH EVERY DAY 05/12/19   Minus Breeding, MD  Blood Glucose Monitoring Suppl (FIFTY50 GLUCOSE METER 2.0)  w/Device KIT Use as directed. 04/27/14   [provider]  fluticasone (FLONASE) 50 MCG/ACT nasal spray Place 2 sprays into both nostrils daily as needed for allergies or rhinitis.  02/13/19   [provider]  gabapentin (NEURONTIN) 300 MG capsule Take 600 mg by mouth at bedtime as needed (pain in foot and ankle).     [provider]  insulin glargine, 1 Unit Dial, (TOUJEO) 300 UNIT/ML Solostar Pen Inject 70 Units into the skin every morning.     [provider]  insulin lispro (HUMALOG) 100 UNIT/ML injection Inject 30 Units into the skin 3 (three) times daily before meals.     [provider]  Insulin Syringe-Needle U-100 31G X 5/16" 0.3 ML MISC use as directed 02/12/08   [provider]  isosorbide mononitrate (IMDUR) 30 MG 24 hr tablet TAKE 1 TABLET BY MOUTH EVERY DAY 04/03/19   Minus Breeding, MD  losartan (COZAAR) 25 MG tablet TAKE 1 TABLET BY MOUTH EVERY DAY 04/03/19   Minus Breeding, MD  metFORMIN (GLUCOPHAGE-XR) 500 MG 24 hr tablet Take 500 mg by mouth daily with breakfast.    [provider]  nitroGLYCERIN (NITROSTAT) 0.4 MG SL tablet Place 1 tablet (0.4 mg total) under the tongue every 5 (five) minutes as needed for chest pain. 12/01/19   Minus Breeding, MD  pantoprazole (PROTONIX) 40 MG tablet TAKE 1 TABLET BY MOUTH EVERY DAY IN THE EVENING 03/11/19   Minus Breeding, MD  potassium chloride (KLOR-CON) 20 MEQ tablet Take 1 tablet (20 mEq total) by mouth daily. 02/13/2020   Almyra Deforest, PA  torsemide (DEMADEX) 20 MG tablet Take 3 tablets (60 mg total) by mouth 2 (two) times daily. Take 40 mg BID PO M-F and Take 60 mg BID PO Saturdays and Sundays 01/21/2020   Almyra Deforest, PA  warfarin (COUMADIN) 4 MG tablet Take 1 tablet (with 5 mg tablet) 2-3 times per week as directed 08/26/19   Minus Breeding, MD  warfarin (COUMADIN) 5 MG tablet TAKE 1 TABLET (5 MG TOTAL) BY MOUTH AS DIRECTED. TAKE AS DIRECTED BY THE COUMADIN CLINIC. 09/22/19   Minus Breeding, MD    Allergies    Simvastatin  Review of Systems   Review of Systems  Constitutional: Positive for fatigue. Negative for chills and fever.  HENT: Negative for rhinorrhea and sore throat.   Eyes: Negative for redness.  Respiratory: Positive for cough and shortness of breath.   Cardiovascular: Positive for leg swelling. Negative for chest pain.  Gastrointestinal:  Positive for abdominal pain and diarrhea. Negative for nausea and vomiting.  Genitourinary: Negative for dysuria and hematuria.  Musculoskeletal: Negative for myalgias.  Skin: Negative for rash.  Neurological: Positive for headaches.    Physical Exam Updated Vital Signs BP (!) 174/90 (BP Location: Right Arm)   Pulse 71   Temp 98.9 F (37.2 C) (Oral)   Resp 19   SpO2 (!) 87%   Physical Exam Vitals and nursing note reviewed.  Constitutional:      Appearance: He is well-developed and well-nourished.  HENT:     Head: Normocephalic and atraumatic.  Eyes:     General:        Right eye: No discharge.        Left eye: No discharge.     Conjunctiva/sclera: Conjunctivae normal.  Cardiovascular:     Rate and Rhythm: Normal rate and regular rhythm.     Heart sounds: Normal heart sounds.  Pulmonary:     Effort: Pulmonary effort is normal.     Breath sounds: Examination of the right-lower field reveals rales. Examination of the left-lower field reveals rales. Rales (minimal) present. No decreased breath sounds, wheezing or rhonchi.  Abdominal:     Palpations: Abdomen is soft.     Tenderness: There is no abdominal tenderness.  Musculoskeletal:     Cervical back: Normal range of motion and neck supple.  Skin:    General: Skin is warm and dry.  Neurological:     Mental Status: He is alert.  Psychiatric:        Mood and Affect: Mood and affect normal.     ED Results / Procedures / Treatments   Labs (all labs ordered are listed, but only abnormal results are displayed) Labs Reviewed  RESP PANEL BY RT-PCR  (FLU A&B, COVID) ARPGX2 - Abnormal; Notable for the following components:      Result Value   SARS Coronavirus 2 by RT PCR POSITIVE (*)    All other components within normal limits  BASIC METABOLIC PANEL - Abnormal; Notable for the following components:   Glucose, Bld 110 (*)    BUN 27 (*)    Creatinine, Ser 1.37 (*)    GFR, Estimated 57 (*)    All other components within normal limits  CBC - Abnormal; Notable for the following components:   Platelets 146 (*)    All other components within normal limits  BRAIN NATRIURETIC PEPTIDE - Abnormal; Notable for the following components:   B Natriuretic Peptide 246.6 (*)    All other components within normal limits  PROTIME-INR - Abnormal; Notable for the following components:   Prothrombin Time 22.0 (*)    INR 2.0 (*)    All other components within normal limits  TROPONIN I (HIGH SENSITIVITY) - Abnormal; Notable for the following components:   Troponin I (High Sensitivity) 20 (*)    All other components within normal limits  TROPONIN I (HIGH SENSITIVITY) - Abnormal; Notable for the following components:   Troponin I (High Sensitivity) 20 (*)    All other components within normal limits  CULTURE, BLOOD (ROUTINE X 2)  CULTURE, BLOOD (ROUTINE X 2)  URINALYSIS, ROUTINE W REFLEX MICROSCOPIC  LACTIC ACID, PLASMA  LACTIC ACID, PLASMA  D-DIMER, QUANTITATIVE (NOT AT [facility])  PROCALCITONIN  LACTATE DEHYDROGENASE  FERRITIN  TRIGLYCERIDES  FIBRINOGEN  C-REACTIVE PROTEIN  HEPATIC FUNCTION PANEL    EKG EKG Interpretation  Date/Time:  Wednesday February 11 2020 18:05:01 EST Ventricular Rate:  63 PR Interval:  176  QRS Duration: 80 QT Interval:  418 QTC Calculation: 427 R Axis:   91 Text Interpretation: Normal sinus rhythm Rightward axis ST elevation similar to Jan 2021 Confirmed by Sherwood Gambler [phone]) on 02/12/2020 8:43:52 AM   Radiology DG Chest 2 View  Result Date: 02/11/2020 CLINICAL DATA:  Shortness of breath and chest  tightness for 1 day. EXAM: CHEST - 2 VIEW COMPARISON:  02/19/2019, CT 06/17/2018 FINDINGS: Post median sternotomy and CABG. Chronic cardiomegaly. There is peribronchial thickening with mild interstitial accentuation. Chronic left basilar pleural thickening with probable prior to talk pleurodesis. Mild left basilar scarring. No pneumothorax. No acute osseous abnormalities are seen. IMPRESSION: 1. Chronic cardiomegaly. 2. Peribronchial thickening with mild interstitial accentuation, suspicious for pulmonary edema. 3. Left basilar scarring. Electronically Signed   By: Keith Rake M.D.   On: 02/11/2020 18:34    Procedures Procedures (including critical care time)  Medications Ordered in ED Medications  dexamethasone (DECADRON) injection 6 mg (has no administration in time range)  furosemide (LASIX) injection 80 mg (80 mg Intravenous Given 02/12/20 0738)  acetaminophen (TYLENOL) tablet 650 mg (650 mg Oral Given 02/12/20 0754)    ED Course  I have reviewed the triage vital signs and the nursing notes.  Pertinent labs & imaging results that were available during my care of the patient were reviewed by me and considered in my medical decision making (see chart for details).   Patient seen and examined. Work-up initiated.  Patient is on 60 mg of torsemide twice a day.  IV Lasix ordered.  Patient will need admission to the hospital given new oxygen requirement.  Covid testing pending.  Vital signs reviewed and are as follows: BP (!) 174/90 (BP Location: Right Arm)   Pulse 71   Temp 98.9 F (37.2 C) (Oral)   Resp 19   SpO2 (!) 87%   8:40 AM COVID+. Pt updated. Continues to do well on O2. Plan for admission.   Bobby Lindsey was evaluated in Emergency Department on 02/12/2020 for the symptoms described in the history of present illness. He was evaluated in the context of the global COVID-19 pandemic, which necessitated consideration that the patient might be at risk for infection with the  SARS-CoV-2 virus that causes COVID-19. Institutional protocols and algorithms that pertain to the evaluation of patients at risk for COVID-19 are in a state of rapid change based on information released by regulatory bodies including the CDC and federal and state organizations. These policies and algorithms were followed during the patient's care in the ED.  9:00 AM Spoke with IMTS who will see patient.     MDM Rules/Calculators/A&P                          Admit.   Final Clinical Impression(s) / ED Diagnoses Final diagnoses:  Acute respiratory failure with hypoxia ([place])  COVID-19  Acute on chronic diastolic congestive heart failure [facility])    Rx / DC Orders ED Discharge Orders    None       Carlisle Cater, PA-C 02/12/20 0901    Sherwood Gambler, MD 02/12/20 1440

## 2020-02-12 NOTE — ED Notes (Signed)
Provider notified of increase in temp.

## 2020-02-12 NOTE — ED Notes (Signed)
Patient has a low o2 sats90%

## 2020-02-13 ENCOUNTER — Inpatient Hospital Stay (HOSPITAL_COMMUNITY): Payer: Medicare HMO

## 2020-02-13 DIAGNOSIS — U071 COVID-19: Secondary | ICD-10-CM | POA: Diagnosis not present

## 2020-02-13 DIAGNOSIS — R0602 Shortness of breath: Secondary | ICD-10-CM | POA: Diagnosis not present

## 2020-02-13 DIAGNOSIS — I361 Nonrheumatic tricuspid (valve) insufficiency: Secondary | ICD-10-CM | POA: Diagnosis not present

## 2020-02-13 LAB — CBC WITH DIFFERENTIAL/PLATELET
Abs Immature Granulocytes: 0.01 10*3/uL (ref 0.00–0.07)
Basophils Absolute: 0 10*3/uL (ref 0.0–0.1)
Basophils Relative: 0 %
Eosinophils Absolute: 0 10*3/uL (ref 0.0–0.5)
Eosinophils Relative: 0 %
HCT: 44.5 % (ref 39.0–52.0)
Hemoglobin: 15 g/dL (ref 13.0–17.0)
Immature Granulocytes: 0 %
Lymphocytes Relative: 23 %
Lymphs Abs: 1 10*3/uL (ref 0.7–4.0)
MCH: 28.8 pg (ref 26.0–34.0)
MCHC: 33.7 g/dL (ref 30.0–36.0)
MCV: 85.6 fL (ref 80.0–100.0)
Monocytes Absolute: 1.2 10*3/uL — ABNORMAL HIGH (ref 0.1–1.0)
Monocytes Relative: 27 %
Neutro Abs: 2.3 10*3/uL (ref 1.7–7.7)
Neutrophils Relative %: 50 %
Platelets: 123 10*3/uL — ABNORMAL LOW (ref 150–400)
RBC: 5.2 MIL/uL (ref 4.22–5.81)
RDW: 15.1 % (ref 11.5–15.5)
WBC: 4.5 10*3/uL (ref 4.0–10.5)
nRBC: 0 % (ref 0.0–0.2)

## 2020-02-13 LAB — COMPREHENSIVE METABOLIC PANEL
ALT: 22 U/L (ref 0–44)
AST: 24 U/L (ref 15–41)
Albumin: 3.3 g/dL — ABNORMAL LOW (ref 3.5–5.0)
Alkaline Phosphatase: 58 U/L (ref 38–126)
Anion gap: 11 (ref 5–15)
BUN: 34 mg/dL — ABNORMAL HIGH (ref 8–23)
CO2: 24 mmol/L (ref 22–32)
Calcium: 9 mg/dL (ref 8.9–10.3)
Chloride: 101 mmol/L (ref 98–111)
Creatinine, Ser: 1.23 mg/dL (ref 0.61–1.24)
GFR, Estimated: >60 mL/min (ref >60)
Glucose, Bld: 202 mg/dL — ABNORMAL HIGH (ref 70–99)
Potassium: 4.3 mmol/L (ref 3.5–5.1)
Sodium: 136 mmol/L (ref 135–145)
Total Bilirubin: 0.8 mg/dL (ref 0.3–1.2)
Total Protein: 6.8 g/dL (ref 6.5–8.1)

## 2020-02-13 LAB — CBG MONITORING, ED
Glucose-Capillary: 212 mg/dL — ABNORMAL HIGH (ref 70–99)
Glucose-Capillary: 242 mg/dL — ABNORMAL HIGH (ref 70–99)
Glucose-Capillary: 276 mg/dL — ABNORMAL HIGH (ref 70–99)
Glucose-Capillary: 230 mg/dL — ABNORMAL HIGH (ref 70–99)
Glucose-Capillary: 304 mg/dL — ABNORMAL HIGH (ref 70–99)

## 2020-02-13 LAB — C-REACTIVE PROTEIN: CRP: 1.8 mg/dL — ABNORMAL HIGH (ref <1.0)

## 2020-02-13 LAB — PROTIME-INR
INR: 1.6 — ABNORMAL HIGH (ref 0.8–1.2)
Prothrombin Time: 18.5 seconds — ABNORMAL HIGH (ref 11.4–15.2)

## 2020-02-13 LAB — ECHOCARDIOGRAM COMPLETE
Area-P 1/2: 4.39 cm2
Height: 72 in
S' Lateral: 4.2 cm
Weight: 4256 oz

## 2020-02-13 LAB — D-DIMER, QUANTITATIVE: D-Dimer, Quant: <0.27 ug/mL-FEU (ref 0.00–0.50)

## 2020-02-13 LAB — HEMOGLOBIN A1C
Hgb A1c MFr Bld: 9.5 % — ABNORMAL HIGH (ref 4.8–5.6)
Mean Plasma Glucose: 225.95 mg/dL

## 2020-02-13 LAB — FERRITIN: Ferritin: 84 ng/mL (ref 24–336)

## 2020-02-13 MED ORDER — WARFARIN SODIUM 6 MG PO TABS
9.0000 mg | ORAL_TABLET | Freq: Once | ORAL | Status: AC
  Administered 2020-02-13: 9 mg via ORAL
  Filled 2020-02-13: qty 1

## 2020-02-13 MED ORDER — PERFLUTREN LIPID MICROSPHERE
1.0000 mL | INTRAVENOUS | Status: AC | PRN
  Administered 2020-02-13: 2 mL via INTRAVENOUS
  Filled 2020-02-13: qty 10

## 2020-02-13 MED ORDER — INSULIN GLARGINE 100 UNIT/ML ~~LOC~~ SOLN
65.0000 [IU] | Freq: Every day | SUBCUTANEOUS | Status: DC
  Administered 2020-02-13: 65 [IU] via SUBCUTANEOUS
  Filled 2020-02-13 (×2): qty 0.65

## 2020-02-13 NOTE — ED Notes (Signed)
Patient CBG was 212.

## 2020-02-13 NOTE — ED Notes (Signed)
Ordered breakfast 

## 2020-02-13 NOTE — Progress Notes (Signed)
  Echocardiogram 2D Echocardiogram with definity has been performed.  Darlina Sicilian M 02/13/2020, 8:19 AM

## 2020-02-13 NOTE — Progress Notes (Signed)
[facility] for Warfarin Indication: Hx of PE, DVT, and afib  Allergies  Allergen Reactions  . Other Other (See Comments)    Steroids- "BGL goes SKY-HIGH"  . Simvastatin Diarrhea and Other (See Comments)    Fatigue, also      Patient Measurements: Height: 6' (182.9 cm) Weight: 120.7 kg (266 lb) IBW/kg (Calculated) : 77.6 Heparin Dosing Weight:   Vital Signs: BP: 145/85 (12/31 0900) Pulse Rate: 57 (12/31 0900)  Labs: Recent Labs    02/11/20 1810 02/11/20 2055 02/12/20 0652 02/13/20 0358  HGB 15.5  --   --  15.0  HCT 48.9  --   --  44.5  PLT 146*  --   --  123*  LABPROT  --   --  22.0* 18.5*  INR  --   --  2.0* 1.6*  CREATININE 1.37*  --   --  1.23  TROPONINIHS 20* 20*  --   --     Estimated Creatinine Clearance: 80.3 mL/min (by C-G formula based on SCr of 1.23 mg/dL).   Medical History: Past Medical History:  Diagnosis Date  . Anginal pain ([place])    last pm  . Atrial fibrillation ([place])    a. Transient during 03/2013 admission.  . Bradycardia    a. Bradycardia/pauses/possible Mobitz II during 03/2013 adm. Not on BB due to this.  Marland Kitchen CAD (coronary artery disease)    a. s/p CABG 2002. b. Hx Cypher stent to the RCA. c. Inf-lat STEMI 03/2013:  LHC (04/05/13):  mLAD occluded, pD1 90, apical br of Dx occluded, CFX occluded, pOM1 90-95, RCA stents patent, diff RCA 30, S-Dx occluded, S-PDA occluded, S-OM1 40-50, L-LAD patent, EF 40% with inf HK.  PCI:  Promus (2.5 x 28) DES to mid to dist CFX.  Marland Kitchen Charcot's joint of knee   . COPD (chronic obstructive pulmonary disease) ([place])   . Deep venous thrombosis (HCC)    right lower extremity  . Diabetes mellitus    a. A1C 10.7 in 03/2013.  . Diastolic CHF ([place])    a. EF 40% by cath, 55-60% during 03/2013 adm, required IV diuresis.  . DVT, lower extremity, recurrent ([place])    a. Hx recurrent DVT per record.  . Dyslipidemia   . Elevated CK    a. Pt has refused rheum workup in the past.  . GERD  (gastroesophageal reflux disease)   . History of epidermal inclusion cyst excision 07/09/2018  . History of hiatal hernia   . HTN (hypertension)    x 15 years  . Hx of cardiovascular stress test    a. Lexiscan Myoview (03/2010):  diaph atten vs inf scar, no ischemia, EF 47%; Low Risk.  Marland Kitchen Hx of echocardiogram    a. Echo (04/08/13):  Mild LVH, EF 55-60%, restrictive physiology, severe LAE, mild reduced RVSF, mild RAE.  . Leg pain    ABI 6/16:  R 1.2, L 1.1 - normal  . Myocardial infarction ([place])   . Obesity   . Peripheral neuropathy   . Pulmonary embolism ([place])   . Sleep apnea     Medications:  Scheduled:  . atorvastatin  20 mg Oral Daily  . dexamethasone  6 mg Oral Q24H  . insulin aspart  0-15 Units Subcutaneous TID WC  . insulin aspart  6 Units Subcutaneous TID WC  . isosorbide mononitrate  30 mg Oral Daily  . losartan  25 mg Oral Daily  . pantoprazole  40 mg Oral QHS  .  potassium chloride SA  20 mEq Oral Daily  . Warfarin - Pharmacist Dosing Inpatient   Does not apply q1600    Assessment: Patient is a 15 yom that is being admitted for COVID. Patient is on warfarin at home for history of a PE, DVT, and afib. Pharmacy has been asked to dose warfarin while patient is inpatient.  PTA schedule: 9 mg every day EXCEPT 5 mg on [place]  - Patient last dose of warfarin was on 12/28, then 12/30 - INR is subtherapeutic at 1.6   Goal of Therapy:  INR 2-3 Monitor platelets by anticoagulation protocol: Yes   Plan:   - Will give Warfarin 9mg  PO x 1 dose  - Monitor pt for s/s of bleeding, PT-INR daily   Alanda Slim, PharmD, [facility] Clinical Pharmacist Please see AMION for all Pharmacists' Contact Phone Numbers 02/13/2020, 9:43 AM

## 2020-02-13 NOTE — ED Notes (Signed)
Pt stated to this nurse that if we did not get his blood sugar down then he was going to treat his sugar himself. Advised pt that taking home insulin while being treated in the hospital is not safe due to potential for overdosing on insulin. Pt advised he would not take his own insulin without speaking with the nurse first.

## 2020-02-13 NOTE — ED Notes (Addendum)
Notified Dr. Konrad Penta that pt states that he is suppose to be taking lasix bid and lasix has not been ordered.  Advised that Dr. Collene Gobble will assess patient.

## 2020-02-13 NOTE — ED Notes (Signed)
Pt c/o SOB

## 2020-02-13 NOTE — ED Notes (Addendum)
Nurse entered room and pt had trash all over floor. IV was out of upper extremity and laying on the floor. Oxygen tubing was laying on the floor.

## 2020-02-13 NOTE — ED Notes (Signed)
Dinner Tray Ordered @ 1704. 

## 2020-02-13 NOTE — Progress Notes (Addendum)
   Subjective: Patient reports feeling worse today than he did yesterday.  He states his breathing has not improved.  He reports that he has not gotten much sleep since hospitalization and feels very fatigued.  Denies any new symptoms.  Objective:  Vital signs in last 24 hours: Vitals:   02/13/20 0900 02/13/20 0940 02/13/20 1000 02/13/20 1030  BP: (!) 145/85 (!) 160/74 (!) 159/111 (!) 126/59  Pulse: (!) 57 (!) 50 60 (!) 59  Resp: 20 (!) 21 19 17   Temp:      TempSrc:      SpO2: 96% 96% 95% 98%  Weight:      Height:        General-laying in bed comfortably, no acute distress Lungs-normal work of breathing, on 4 L nasal cannula, no accessory muscle use CV-regular rate and rhythm, no murmurs rubs or gallops Extremities-trace bilateral lower extremity pitting edema, left BKA Skin-warm and dry Psych-normal mood and affect Neuro-alert and oriented x3  Assessment/Plan:  Principal Problem:   COVID-19 Active Problems:   Diabetes (HCC)   Paroxysmal atrial fibrillation (HCC)   Acute on chronic HFrEF (heart failure with reduced ejection fraction) (HCC)   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 Bobby Lindsey is a 64 year old man with a history of pulmonary hypertension, CKD stage III, type 2 diabetes, hypertension, HLD, CAD status post CABG, atrial fibrillation, a flutter and recurrent DVTs/PEs presenting with acute hypoxemic respiratory failure secondary to Covid 19.  Acute hypoxemic respiratory failure COVID-19 infection Patient is saturating at 96% on 3 L nasal cannula.  Normal work of breathing.  He is currently being street treated with Decadron 6 mg p.o.  Inflammatory markers are overall unremarkable.  Overall stable clinical picture however patient feels worse today.  We will continue to treat with oral steroids and supplemental oxygen as needed. -Continue supplemental O2, goal greater than 92% -Encourage incentive spirometry, flutter valve -Continue  Decadron 6 mg p.o.  Acute on chronic heart failure with mildly reduced ejection fraction and moderate right ventricular dysfunction Last echo February 20, 2019 showed severely elevated pulmonary artery systolic pressures with dilated IVC.  Repeat echo, results pending.  Type 2 diabetes Hemoglobin A1c 9.5.  Home medications include long-acting insulin 80 units every morning before breakfast and 20 to 30 units NovoLog 3 times daily with meals.  Started on Lantus 60 units on admission with NovoLog 6 units 3 times daily with meals and sliding scale.  Fasting blood glucose this morning 242.  Will increase Lantus to 65 units and continue short acting and sliding scale.  Will likely need to further adjust as patient is receiving steroids. -Increase Lantus 65 units daily and NovoLog 6 units 3 times daily with meals -SSI -CBG monitoring  CKD stage III Stable.  Continue to trend BMP and avoid nephrotoxic medications  History of atrial fibrillation/a flutter/bradycardia History recurrent DVT/PE Continue home medication warfarin per pharmacy.  Prior to Admission Living Arrangement: Lives at home with wife Anticipated Discharge Location: Home Barriers to Discharge: Clinical improvement Dispo: Anticipated discharge pending clinical improvement.   Linette Gunderson N, DO 02/13/2020, 12:51 PM Pager: [phone] After 5pm on weekdays and 1pm on weekends: On Call pager [phone]

## 2020-02-14 ENCOUNTER — Encounter (HOSPITAL_COMMUNITY): Payer: Self-pay | Admitting: Student in an Organized Health Care Education/Training Program

## 2020-02-14 LAB — CBC WITH DIFFERENTIAL/PLATELET
Abs Immature Granulocytes: 0.02 10*3/uL (ref 0.00–0.07)
Basophils Absolute: 0 10*3/uL (ref 0.0–0.1)
Basophils Relative: 0 %
Eosinophils Absolute: 0 10*3/uL (ref 0.0–0.5)
Eosinophils Relative: 0 %
HCT: 43.5 % (ref 39.0–52.0)
Hemoglobin: 14.7 g/dL (ref 13.0–17.0)
Immature Granulocytes: 0 %
Lymphocytes Relative: 19 %
Lymphs Abs: 1.1 10*3/uL (ref 0.7–4.0)
MCH: 29 pg (ref 26.0–34.0)
MCHC: 33.8 g/dL (ref 30.0–36.0)
MCV: 85.8 fL (ref 80.0–100.0)
Monocytes Absolute: 1.2 10*3/uL — ABNORMAL HIGH (ref 0.1–1.0)
Monocytes Relative: 19 %
Neutro Abs: 3.7 10*3/uL (ref 1.7–7.7)
Neutrophils Relative %: 62 %
Platelets: 126 10*3/uL — ABNORMAL LOW (ref 150–400)
RBC: 5.07 MIL/uL (ref 4.22–5.81)
RDW: 15.1 % (ref 11.5–15.5)
WBC: 6 10*3/uL (ref 4.0–10.5)
nRBC: 0 % (ref 0.0–0.2)

## 2020-02-14 LAB — GLUCOSE, CAPILLARY
Glucose-Capillary: 248 mg/dL — ABNORMAL HIGH (ref 70–99)
Glucose-Capillary: 283 mg/dL — ABNORMAL HIGH (ref 70–99)
Glucose-Capillary: 276 mg/dL — ABNORMAL HIGH (ref 70–99)
Glucose-Capillary: 157 mg/dL — ABNORMAL HIGH (ref 70–99)
Glucose-Capillary: 201 mg/dL — ABNORMAL HIGH (ref 70–99)

## 2020-02-14 LAB — COMPREHENSIVE METABOLIC PANEL
ALT: 22 U/L (ref 0–44)
AST: 23 U/L (ref 15–41)
Albumin: 3.1 g/dL — ABNORMAL LOW (ref 3.5–5.0)
Alkaline Phosphatase: 53 U/L (ref 38–126)
Anion gap: 10 (ref 5–15)
BUN: 40 mg/dL — ABNORMAL HIGH (ref 8–23)
CO2: 24 mmol/L (ref 22–32)
Calcium: 8.8 mg/dL — ABNORMAL LOW (ref 8.9–10.3)
Chloride: 100 mmol/L (ref 98–111)
Creatinine, Ser: 1.34 mg/dL — ABNORMAL HIGH (ref 0.61–1.24)
GFR, Estimated: 59 mL/min — ABNORMAL LOW (ref >60)
Glucose, Bld: 259 mg/dL — ABNORMAL HIGH (ref 70–99)
Potassium: 4.5 mmol/L (ref 3.5–5.1)
Sodium: 134 mmol/L — ABNORMAL LOW (ref 135–145)
Total Bilirubin: 0.6 mg/dL (ref 0.3–1.2)
Total Protein: 6.6 g/dL (ref 6.5–8.1)

## 2020-02-14 LAB — CBG MONITORING, ED: Glucose-Capillary: 265 mg/dL — ABNORMAL HIGH (ref 70–99)

## 2020-02-14 LAB — FERRITIN: Ferritin: 78 ng/mL (ref 24–336)

## 2020-02-14 LAB — C-REACTIVE PROTEIN: CRP: 0.9 mg/dL (ref <1.0)

## 2020-02-14 LAB — PROTIME-INR
INR: 1.7 — ABNORMAL HIGH (ref 0.8–1.2)
Prothrombin Time: 19.6 seconds — ABNORMAL HIGH (ref 11.4–15.2)

## 2020-02-14 LAB — D-DIMER, QUANTITATIVE: D-Dimer, Quant: 0.39 ug/mL-FEU (ref 0.00–0.50)

## 2020-02-14 MED ORDER — INSULIN GLARGINE 100 UNIT/ML ~~LOC~~ SOLN
70.0000 [IU] | Freq: Every day | SUBCUTANEOUS | Status: DC
  Administered 2020-02-14: 70 [IU] via SUBCUTANEOUS
  Filled 2020-02-14: qty 0.7

## 2020-02-14 MED ORDER — INSULIN ASPART 100 UNIT/ML ~~LOC~~ SOLN
0.0000 [IU] | Freq: Three times a day (TID) | SUBCUTANEOUS | Status: DC
  Administered 2020-02-14: 4 [IU] via SUBCUTANEOUS
  Administered 2020-02-14: 11 [IU] via SUBCUTANEOUS
  Administered 2020-02-15: 7 [IU] via SUBCUTANEOUS
  Administered 2020-02-15: 3 [IU] via SUBCUTANEOUS
  Administered 2020-02-15: 4 [IU] via SUBCUTANEOUS
  Administered 2020-02-16: 3 [IU] via SUBCUTANEOUS

## 2020-02-14 MED ORDER — INSULIN ASPART 100 UNIT/ML ~~LOC~~ SOLN
10.0000 [IU] | Freq: Three times a day (TID) | SUBCUTANEOUS | Status: DC
  Administered 2020-02-14 (×3): 10 [IU] via SUBCUTANEOUS

## 2020-02-14 MED ORDER — WARFARIN SODIUM 3 MG PO TABS
9.0000 mg | ORAL_TABLET | Freq: Once | ORAL | Status: AC
  Administered 2020-02-14: 9 mg via ORAL
  Filled 2020-02-14: qty 3

## 2020-02-14 MED ORDER — FUROSEMIDE 10 MG/ML IJ SOLN
60.0000 mg | Freq: Every day | INTRAMUSCULAR | Status: DC
  Administered 2020-02-14 – 2020-02-15 (×2): 60 mg via INTRAVENOUS
  Filled 2020-02-14 (×2): qty 6

## 2020-02-14 MED ORDER — INSULIN ASPART 100 UNIT/ML ~~LOC~~ SOLN
0.0000 [IU] | Freq: Every day | SUBCUTANEOUS | Status: DC
  Administered 2020-02-14 – 2020-02-15 (×2): 2 [IU] via SUBCUTANEOUS

## 2020-02-14 MED ORDER — ORAL CARE MOUTH RINSE
15.0000 mL | Freq: Two times a day (BID) | OROMUCOSAL | Status: DC
  Administered 2020-02-14 – 2020-02-16 (×5): 15 mL via OROMUCOSAL

## 2020-02-14 NOTE — Progress Notes (Signed)
[facility] [facility] for Warfarin Indication: Hx of PE, DVT, and afib  Allergies  Allergen Reactions  . Other Other (See Comments)    Steroids- "BGL goes SKY-HIGH"  . Simvastatin Diarrhea and Other (See Comments)    Fatigue, also      Patient Measurements: Height: 5\' 11"  (180.3 cm) Weight: 117.5 kg (259 lb) IBW/kg (Calculated) : 75.3 Heparin Dosing Weight:   Vital Signs: Temp: 97.5 F (36.4 C) (01/01 0816) Temp Source: Oral (01/01 0816) BP: 154/96 (01/01 0816) Pulse Rate: 44 (01/01 0816)  Labs: Recent Labs    02/11/20 1810 02/11/20 2055 02/12/20 0652 02/13/20 0358 02/14/20 0555  HGB 15.5  --   --  15.0 14.7  HCT 48.9  --   --  44.5 43.5  PLT 146*  --   --  123* 126*  LABPROT  --   --  22.0* 18.5* 19.6*  INR  --   --  2.0* 1.6* 1.7*  CREATININE 1.37*  --   --  1.23 1.34*  TROPONINIHS 20* 20*  --   --   --     Estimated Creatinine Clearance: 71.7 mL/min (A) (by C-G formula based on SCr of 1.34 mg/dL (H)).   Assessment: Patient is a 21 yom that is being admitted for COVID. Patient is on warfarin at home for history of a PE, DVT, and afib. Pharmacy has been asked to dose warfarin while patient is inpatient. PTA schedule: 9 mg every day EXCEPT 5 mg on MONDAYS & FRIDAYS  Patient received 9mg  warfarin on 12/31. INR this AM is 1.7 which remains subtherapeutic but increased from admission INR of 1.6. No signs of bleeding and oral intake ok per nurse (on cardiac diet). CBC is unremarkable but Plts are 126 and trending down.      Goal of Therapy:  INR 2-3 Monitor platelets by anticoagulation protocol: Yes   Plan:  Will give Warfarin 9mg  PO x 1 dose  Monitor pt for s/s of bleeding, PT-INR daily   Cephus Slater, PharmD, [facility] Resident [phone] 02/14/2020 8:49 AM

## 2020-02-14 NOTE — Progress Notes (Signed)
Pt refused cpap for the night.  

## 2020-02-14 NOTE — Plan of Care (Signed)
Discussed with patient plan of care for the evening, pain management and importance of no home medications being used with some teach back displayed.  ED RN had gone through items and found no insulin here with [place].  Patient denied having any home mediications.  Problem: Pain Managment: Goal: General experience of comfort will improve Outcome: Progressing   Problem: Safety: Goal: Ability to remain free from injury will improve Outcome: Progressing

## 2020-02-14 NOTE — Progress Notes (Signed)
Notified by tele that Pt's HR decreased from baseline mid 50s to 30s for a few moments then returned to mid 34s. Pt asymptomatic to episode. Notified Dr. Geryl Councilman

## 2020-02-14 NOTE — Hospital Course (Signed)
Denies hx of other lung disease, although did have CTA chest 06/17/2018 that showed tracheobronchomalacia with mosaic lung appearance (seen in small airway disease) with new pleural calcifications of the L hemidiaphragm and new slight mediastinal and bilateral hilar adenopathy, reactive vs. Malignant.

## 2020-02-14 NOTE — Progress Notes (Addendum)
Subjective:   Patient reports his breathing is feeling worse today. Only got 3 hours of sleep. His appetite is good, able to eat and drink okay. He is concerned about his covid infection and treatment. He is tearful on exam because he did not get the vaccination, and states he wants "maxium" treatment. Saturating well throughout interview and exam without supplemental oxygen. He states he has had needed IV diuretics in the past. He also notes that he has had ~3 cardiac stents in the past, last around 2015. He states his weight is up about 10 lbs over the last several months (notes baseline ~258lbs). No night sweats, no cough prior to this hospitalization. He notes extensive family history of lung cancer, although states his whole family smoked. 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 He states he has been around chemicals, mostly pesticides. Denies CP, palpitations.  Objective:  Vital signs in last 24 hours: Vitals:   02/14/20 0306 02/14/20 0400 02/14/20 0500 02/14/20 0600  BP: (!) 154/91     Pulse: (!) 53 (!) 47 (!) 54 (!) 46  Resp: 16 17 18 17   Temp: 98 F (36.7 C)     TempSrc: Oral     SpO2: 98% 97% 90% 94%  Weight:      Height: 5\' 11"  (1.803 m)      General- Patient is obese. Appears uncomfortable although in no acute distress.  Respiratory - There are rales present at the bases bilaterally. Mild increased work of breathing and accessory muscle use with tachypnea. Saturations improved from ~88% on room air to 90's with 5L [place].  CV- Rate is bradycardic, rhythm is irregularly irregular. No murmurs, rubs, or gallops. 1-2+ pitting edema present of the RLE present up to the knee. MSK- L BKA Skin-warm and dry. Chronic venous stasis with erythema and scaling of the RLE.  Neuro-alert and oriented x3. Decreased sensation RLE.  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   Acute hypoxemic respiratory failure Oxygen saturations 88% on room air, increased to 95% with 5L . Endorses worsening SOB and has tachypnea with some increased work of breathing. Likely due to progressive CHF exacerbation over the past couple of months given pulmonary and peripheral edema with 10 lb weight gain recently. Had recently had home torsemide increased to 60mg  twice daily. Received 80mg  Lasix in the ED although has not been on diuretic since. No known COPD, asthma, smoking history although patient did not complete PFTs in December that were ordered. However, CTA chest 06/17/2018 did note possible small airway disease with pleural calcifications and slight mediastinal / hilar adenopathy. 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 - Continue supplemental O2 with goal > 92%  - Incentive spirometry, flutter valve  - Additional care as detailed below  - Continue supplemental O2, goal greater than 92% -Encourage incentive  spirometry, flutter valve -Continue Decadron 6 mg p.o.  COVID-19 Infection Patient tested positive for COVID-19 on 02/11/20. Continues to require ~5L [place] to maintain saturations. States entire family is sick with COVID. - Continue Decadron 6mg  PO total 10 days  - Isolation and contact precautions - Quarantine until 02/01/20  Acute on chronic combined biventricular HFrEF Last echo  February 20, 2019 showed EF 55-60% with normal LV and RV function (despite A-flutter), although was found to have severely elevated pulmonary artery pressure. Repeat ECHO here showed LVEF 45-50% with lateral wall hypokinesis with mild eccentric LVH and grade II Diastolic Dysfunction and elevated LAP, moderately reduced RV failure with severely elevated pulmonary artery systolic pressure and RV pressure overload and mild-moderate TV regurgitation. Mild LVH, LVE, pulmonary HTN and IVC dilation noted on previous ECHO's, although RV dysfunction appears to have worsened. Concern for ischemic cause. Has recently had increase in home torsemide to 60mg  twice daily. Received IV Lasix in the ED though none since and appears to have worsening hypervolemia.  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 Patient had single very short episode of atypical CP in the ED although resolved in seconds without recurrence. Initial EKG's difficult to interpret due to artifact, although repeat EKG today showed T wave inversions in lateral leads and Q waves in inferior leads (all chronic except for possible new T wave inversions in aVL since last cardiology visit 09/2019). Initial troponins 20 and flat. - Continue to monitor for signs/symptoms of ischemia  - If recurrence of CP, repeat EKG - Consult cardiology if concerns of ischemia or progressive bradycardia - Continue Warfarin (hx PE/DVT) dosing per pharmacy  - PT/INR monitoring   Uncontrolled IDDM Type II Hemoglobin A1c 9.5.  Home medications include long-acting insulin 80 units every morning before breakfast and 20 to 30 units NovoLog 3 times daily with  meals (self adjusts).  Started on Lantus 60 units on admission with NovoLog 6 units 3 times daily with meals and sliding scale. Lantus was increased to 65 units nightly yesterday although sugars remain elevated, 230-304 since yesterday. Sugars likely elevated due to steroids.  -Increased Lantus to 70 units nightly  - Increase to resistant SSI  - Increase mealtime coverage from 6 to 10 units TID WC  -CBG monitoring  CKD stage III Stable.  - Continue to trend BMP and avoid nephrotoxic medications  [facility] Insurance would not cover CPAP  History of atrial fibrillation/a flutter/bradycardia Hx Mobitz type I/II AV Block History recurrent DVT/PE Patient has been bradycardic in the 40-50's since yesterday. Intermittently in A-Fib. This is chronic. - Continue home Warfarin, per pharmacy - Continuous telemetry  - Continue to hold beta blockers  Prior to Admission Living Arrangement: Lives at home with wife Anticipated Discharge Location: Home Barriers to Discharge: Clinical improvement Dispo: Anticipated discharge pending clinical improvement.   Jeralyn Bennett, MD 02/14/2020, 6:33 AM Pager: [phone] After 5pm on weekdays and 1pm on weekends: On Call pager [phone]

## 2020-02-14 NOTE — Progress Notes (Signed)
Notified by tele that pt had a 2.4 second pause in HR and 2.2 second pause in HR. Service paged.

## 2020-02-14 NOTE — ED Notes (Signed)
Report given to Tracie,RN

## 2020-02-14 DEATH — deceased

## 2020-02-15 DIAGNOSIS — U071 COVID-19: Secondary | ICD-10-CM | POA: Diagnosis not present

## 2020-02-15 LAB — CBC WITH DIFFERENTIAL/PLATELET
Abs Immature Granulocytes: 0.02 10*3/uL (ref 0.00–0.07)
Basophils Absolute: 0 10*3/uL (ref 0.0–0.1)
Basophils Relative: 0 %
Eosinophils Absolute: 0 10*3/uL (ref 0.0–0.5)
Eosinophils Relative: 0 %
HCT: 45.1 % (ref 39.0–52.0)
Hemoglobin: 14.5 g/dL (ref 13.0–17.0)
Immature Granulocytes: 0 %
Lymphocytes Relative: 13 %
Lymphs Abs: 0.6 10*3/uL — ABNORMAL LOW (ref 0.7–4.0)
MCH: 27.7 pg (ref 26.0–34.0)
MCHC: 32.2 g/dL (ref 30.0–36.0)
MCV: 86.2 fL (ref 80.0–100.0)
Monocytes Absolute: 0.8 10*3/uL (ref 0.1–1.0)
Monocytes Relative: 17 %
Neutro Abs: 3.3 10*3/uL (ref 1.7–7.7)
Neutrophils Relative %: 70 %
Platelets: 98 10*3/uL — ABNORMAL LOW (ref 150–400)
RBC: 5.23 MIL/uL (ref 4.22–5.81)
RDW: 15.1 % (ref 11.5–15.5)
WBC: 4.8 10*3/uL (ref 4.0–10.5)
nRBC: 0 % (ref 0.0–0.2)

## 2020-02-15 LAB — FERRITIN: Ferritin: 93 ng/mL (ref 24–336)

## 2020-02-15 LAB — COMPREHENSIVE METABOLIC PANEL
ALT: 31 U/L (ref 0–44)
AST: 36 U/L (ref 15–41)
Albumin: 3 g/dL — ABNORMAL LOW (ref 3.5–5.0)
Alkaline Phosphatase: 53 U/L (ref 38–126)
Anion gap: 11 (ref 5–15)
BUN: 46 mg/dL — ABNORMAL HIGH (ref 8–23)
CO2: 23 mmol/L (ref 22–32)
Calcium: 8.7 mg/dL — ABNORMAL LOW (ref 8.9–10.3)
Chloride: 101 mmol/L (ref 98–111)
Creatinine, Ser: 1.38 mg/dL — ABNORMAL HIGH (ref 0.61–1.24)
GFR, Estimated: 57 mL/min — ABNORMAL LOW (ref >60)
Glucose, Bld: 208 mg/dL — ABNORMAL HIGH (ref 70–99)
Potassium: 4.6 mmol/L (ref 3.5–5.1)
Sodium: 135 mmol/L (ref 135–145)
Total Bilirubin: 1.2 mg/dL (ref 0.3–1.2)
Total Protein: 6.3 g/dL — ABNORMAL LOW (ref 6.5–8.1)

## 2020-02-15 LAB — GLUCOSE, CAPILLARY
Glucose-Capillary: 202 mg/dL — ABNORMAL HIGH (ref 70–99)
Glucose-Capillary: 183 mg/dL — ABNORMAL HIGH (ref 70–99)
Glucose-Capillary: 218 mg/dL — ABNORMAL HIGH (ref 70–99)
Glucose-Capillary: 242 mg/dL — ABNORMAL HIGH (ref 70–99)

## 2020-02-15 LAB — C-REACTIVE PROTEIN: CRP: 2 mg/dL — ABNORMAL HIGH (ref <1.0)

## 2020-02-15 LAB — D-DIMER, QUANTITATIVE: D-Dimer, Quant: 0.29 ug/mL-FEU (ref 0.00–0.50)

## 2020-02-15 LAB — PROTIME-INR
INR: 1.6 — ABNORMAL HIGH (ref 0.8–1.2)
Prothrombin Time: 18.3 seconds — ABNORMAL HIGH (ref 11.4–15.2)

## 2020-02-15 MED ORDER — CALCIUM CARBONATE ANTACID 500 MG PO CHEW
1.0000 | CHEWABLE_TABLET | Freq: Every day | ORAL | Status: DC
  Administered 2020-02-15 – 2020-02-16 (×3): 200 mg via ORAL
  Filled 2020-02-15 (×3): qty 1

## 2020-02-15 MED ORDER — INSULIN GLARGINE 100 UNIT/ML ~~LOC~~ SOLN
75.0000 [IU] | Freq: Every day | SUBCUTANEOUS | Status: DC
  Administered 2020-02-15: 75 [IU] via SUBCUTANEOUS
  Filled 2020-02-15 (×2): qty 0.75

## 2020-02-15 MED ORDER — FAMOTIDINE 20 MG PO TABS
20.0000 mg | ORAL_TABLET | Freq: Once | ORAL | Status: AC
  Administered 2020-02-15: 20 mg via ORAL
  Filled 2020-02-15: qty 1

## 2020-02-15 MED ORDER — TORSEMIDE 20 MG PO TABS
60.0000 mg | ORAL_TABLET | Freq: Every day | ORAL | Status: DC
  Administered 2020-02-15 – 2020-02-16 (×2): 60 mg via ORAL
  Filled 2020-02-15 (×2): qty 3

## 2020-02-15 MED ORDER — WARFARIN SODIUM 5 MG PO TABS
10.0000 mg | ORAL_TABLET | Freq: Once | ORAL | Status: AC
  Administered 2020-02-15: 10 mg via ORAL
  Filled 2020-02-15: qty 2

## 2020-02-15 MED ORDER — INSULIN ASPART 100 UNIT/ML ~~LOC~~ SOLN
12.0000 [IU] | Freq: Three times a day (TID) | SUBCUTANEOUS | Status: DC
  Administered 2020-02-15 – 2020-02-16 (×4): 12 [IU] via SUBCUTANEOUS

## 2020-02-15 NOTE — Progress Notes (Signed)
Pt refused his CPAP at bedtime. C/o Heartburn not relieved by pepcid and protonix. On call MD notified.

## 2020-02-15 NOTE — Progress Notes (Signed)
Pt refused CPAP for the night. RT will continue to monitor.

## 2020-02-15 NOTE — Progress Notes (Signed)
   Subjective: Patient reports feeling better this morning.  States his breathing has improved.  He states his swelling has gone down and he feels that his volume status is at his baseline.  He denies any other complaints.  Discussed that we will ambulate him today and possibly discharge home pending saturations.  Patient feels okay with plan to go home today.  Objective:  Vital signs in last 24 hours: Vitals:   02/14/20 0816 02/14/20 1900 02/14/20 2300 02/15/20 0600  BP: (!) 154/96  (!) 163/84 127/79  Pulse: (!) 44 (!) 55 60 (!) 49  Resp: (!) 21 19 19 18   Temp: (!) 97.5 F (36.4 C) 98.2 F (36.8 C) 97.7 F (36.5 C) 97.8 F (36.6 C)  TempSrc: Oral  Oral   SpO2: 93% 96% 93% 96%  Weight:      Height:       Supplemental oxygen-saturating well on 2.5L  General-sitting at the bedside comfortably, no acute distress CV-bradycardic, irregularly irregular rhythm, no murmurs, no lower extremity pitting edema Lungs-clear to auscultation bilaterally, normal work of breathing Skin-warm and dry Psych-normal mood and affect  Assessment/Plan:  Principal Problem:   COVID-19 Active Problems:   Diabetes (HCC)   Paroxysmal atrial fibrillation (HCC)   Acute on chronic HFrEF (heart failure with reduced ejection fraction) (HCC)   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   Acute hypoxemic respiratory failure Covid-19 HFrEF Patient is saturating well on 2.5 L this morning.  He has received 4 days of steroids.  Reports feeling an improvement in his breathing.  Given IV Lasix 60 mg yesterday with good urinary output.  This may have contributed  to his respiratory failure along with COVID-19 infection.  Patient is clinically doing a lot better and likely stable for discharge home later today.  We will check his pulse ox with ambulation to determine supplemental oxygen requirements for home.  Plan to discontinue steroids at discharge.  He will need to quarantine until 01/31/2021. -Continue supplemental O2 with goal > 92%  -Incentive spirometry, flutter valve  -Encourage incentive spirometry, flutter valve -Continue home dose of oral torsemide  Uncontrolled IDDM Type II Fasting blood glucose 202 this morning.  Continue to adjust insulin requirements.  Will resume home doses of insulin at discharge -Lantus to 70 units nightly  -SSI -CBG monitoring  History of atrial fibrillation/a flutter/bradycardia Hx Mobitz type I/II AV Block History recurrent DVT/PE Persistently bradycardic, asymptomatic at this time.  He is currently in A. fib and rate controlled. - Continue home Warfarin, per pharmacy - Continuous telemetry  - Continue to hold beta blockers  Prior to Admission Living Arrangement: Home Anticipated Discharge Location: Home Barriers to Discharge: Oxygen needs  Dispo: Anticipated discharge is likely later today pending pulse oxygenation with ambulation.   Bobby Craze, DO 02/15/2020, 6:52 AM Pager: [phone] After 5pm on weekdays and 1pm on weekends: On Call pager [phone]

## 2020-02-15 NOTE — Progress Notes (Signed)
ANTICOAGULATION CONSULT NOTE   Pharmacy Consult for Warfarin Indication: Hx of PE, DVT, and afib   Patient Measurements: Height: 5\' 11"  (180.3 cm) Weight: 117.5 kg (259 lb) IBW/kg (Calculated) : 75.3 Heparin Dosing Weight:   Vital Signs: Temp: 97.8 F (36.6 C) (01/02 0600) Temp Source: Oral (01/01 2300) BP: 127/79 (01/02 0600) Pulse Rate: 49 (01/02 0600)  Labs: Recent Labs    02/13/20 0358 02/14/20 0555 02/15/20 0310  HGB 15.0 14.7 14.5  HCT 44.5 43.5 45.1  PLT 123* 126* 98*  LABPROT 18.5* 19.6* 18.3*  INR 1.6* 1.7* 1.6*  CREATININE 1.23 1.34* 1.38*    Estimated Creatinine Clearance: 69.6 mL/min (A) (by C-G formula based on SCr of 1.38 mg/dL (H)).   Assessment: Patient is a 20 yom that is being admitted for COVID. Patient is on warfarin at home for history of a PE, DVT, and afib. Pharmacy has been asked to dose warfarin while patient is inpatient. PTA schedule: 9 mg every day EXCEPT 5 mg on Monday and Fridays.   Patient received 9mg  warfarin on 1/1. INR this AM is 1.6 which remains subtherapeutic but increased from admission INR of 1.6. No signs of bleeding and oral intake ok per nurse (on cardiac diet). CBC is unremarkable but Plts are 98 (low since admission). The patient's Scr this morning is 1.38 which is up from 1.23 on 12/31.       Goal of Therapy:  INR 2-3 Monitor platelets by anticoagulation protocol: Yes   Plan:  Will give Warfarin 10mg  PO x 1 dose  Monitor pt for s/s of bleeding, PT-INR daily   Cephus Slater, PharmD, [facility] Resident [phone] 02/15/2020 7:47 AM

## 2020-02-15 NOTE — Plan of Care (Signed)
  Problem: Activity: Goal: Risk for activity intolerance will decrease Outcome: Progressing   Problem: Pain Managment: Goal: General experience of comfort will improve Outcome: Progressing   Problem: Safety: Goal: Ability to remain free from injury will improve Outcome: Progressing   Problem: Skin Integrity: Goal: Risk for impaired skin integrity will decrease Outcome: Progressing   Problem: Education: Goal: Knowledge of risk factors and measures for prevention of condition will improve Outcome: Progressing   Problem: Coping: Goal: Psychosocial and spiritual needs will be supported Outcome: Progressing   Problem: Respiratory: Goal: Will maintain a patent airway Outcome: Progressing Goal: Complications related to the disease process, condition or treatment will be avoided or minimized Outcome: Progressing

## 2020-02-15 NOTE — Progress Notes (Incomplete)
Patient Saturations on Room Air at Rest = 93%  Patient Saturations on [employer] while Ambulating = 80%  Patient Saturations on 2 Liters of oxygen while Ambulating = 93%

## 2020-02-15 NOTE — Progress Notes (Signed)
Got a call from Tele monitor tech that pt had a pause of 2.48sec. Pt denies any discomfort HR in mid 40's at rest. On call MD is aware.

## 2020-02-16 DIAGNOSIS — U071 COVID-19: Secondary | ICD-10-CM | POA: Diagnosis not present

## 2020-02-16 LAB — CBC WITH DIFFERENTIAL/PLATELET
Abs Immature Granulocytes: 0.02 10*3/uL (ref 0.00–0.07)
Basophils Absolute: 0 10*3/uL (ref 0.0–0.1)
Basophils Relative: 0 %
Eosinophils Absolute: 0 10*3/uL (ref 0.0–0.5)
Eosinophils Relative: 0 %
HCT: 51.6 % (ref 39.0–52.0)
Hemoglobin: 16.9 g/dL (ref 13.0–17.0)
Immature Granulocytes: 0 %
Lymphocytes Relative: 19 %
Lymphs Abs: 1.3 10*3/uL (ref 0.7–4.0)
MCH: 27.8 pg (ref 26.0–34.0)
MCHC: 32.8 g/dL (ref 30.0–36.0)
MCV: 84.9 fL (ref 80.0–100.0)
Monocytes Absolute: 0.9 10*3/uL (ref 0.1–1.0)
Monocytes Relative: 14 %
Neutro Abs: 4.7 10*3/uL (ref 1.7–7.7)
Neutrophils Relative %: 67 %
Platelets: 118 10*3/uL — ABNORMAL LOW (ref 150–400)
RBC: 6.08 MIL/uL — ABNORMAL HIGH (ref 4.22–5.81)
RDW: 15 % (ref 11.5–15.5)
WBC: 7 10*3/uL (ref 4.0–10.5)
nRBC: 0 % (ref 0.0–0.2)

## 2020-02-16 LAB — C-REACTIVE PROTEIN: CRP: 3.3 mg/dL — ABNORMAL HIGH (ref <1.0)

## 2020-02-16 LAB — PROTIME-INR
INR: 1.4 — ABNORMAL HIGH (ref 0.8–1.2)
Prothrombin Time: 16.2 seconds — ABNORMAL HIGH (ref 11.4–15.2)

## 2020-02-16 LAB — GLUCOSE, CAPILLARY
Glucose-Capillary: 122 mg/dL — ABNORMAL HIGH (ref 70–99)
Glucose-Capillary: 78 mg/dL (ref 70–99)
Glucose-Capillary: 69 mg/dL — ABNORMAL LOW (ref 70–99)

## 2020-02-16 LAB — COMPREHENSIVE METABOLIC PANEL
ALT: 35 U/L (ref 0–44)
AST: 39 U/L (ref 15–41)
Albumin: 3.5 g/dL (ref 3.5–5.0)
Alkaline Phosphatase: 63 U/L (ref 38–126)
Anion gap: 12 (ref 5–15)
BUN: 47 mg/dL — ABNORMAL HIGH (ref 8–23)
CO2: 26 mmol/L (ref 22–32)
Calcium: 9.1 mg/dL (ref 8.9–10.3)
Chloride: 97 mmol/L — ABNORMAL LOW (ref 98–111)
Creatinine, Ser: 1.43 mg/dL — ABNORMAL HIGH (ref 0.61–1.24)
GFR, Estimated: 54 mL/min — ABNORMAL LOW (ref >60)
Glucose, Bld: 108 mg/dL — ABNORMAL HIGH (ref 70–99)
Potassium: 4.4 mmol/L (ref 3.5–5.1)
Sodium: 135 mmol/L (ref 135–145)
Total Bilirubin: 1 mg/dL (ref 0.3–1.2)
Total Protein: 7.5 g/dL (ref 6.5–8.1)

## 2020-02-16 LAB — FERRITIN: Ferritin: 160 ng/mL (ref 24–336)

## 2020-02-16 LAB — D-DIMER, QUANTITATIVE: D-Dimer, Quant: 0.73 ug/mL-FEU — ABNORMAL HIGH (ref 0.00–0.50)

## 2020-02-16 MED ORDER — WARFARIN SODIUM 5 MG PO TABS: 10.0000 mg | ORAL_TABLET | Freq: Once | ORAL | Status: DC

## 2020-02-16 MED ORDER — LIVING WELL WITH DIABETES BOOK
Freq: Once | Status: AC
  Filled 2020-02-16: qty 1

## 2020-02-16 MED ORDER — WARFARIN SODIUM 5 MG PO TABS: 10.0000 mg | ORAL_TABLET | Freq: Every day | ORAL | 1 refills | Status: DC

## 2020-02-16 MED ORDER — GUAIFENESIN-DM 100-10 MG/5ML PO SYRP: 10.0000 mL | ORAL_SOLUTION | ORAL | 0 refills | Status: AC | PRN

## 2020-02-16 NOTE — Discharge Instructions (Signed)
Bobby Lindsey,  Your hospitalized due to Covid-19 infection and your heart failure.  For your Covid infection you were treated with steroids and supplemental oxygen.  Regarding your heart failure, we treated you with IV Lasix.  Please follow-up with your primary care doctor in 1 week. Continue taking your medications as prescribed.  Thank you for allowing [place] to be a part in your care!

## 2020-02-16 NOTE — Discharge Summary (Addendum)
Name: Bobby Lindsey MRN: [id number] DOB: 1955-06-13 65 y.o. PCP: Adin Hector, MD  Date of Admission: 02/11/2020  6:02 PM Date of Discharge: 02/16/19 Attending Physician: Axel Filler, *  Discharge Diagnosis: 1. COVID pneumonia 2. Acute on chronic systolic heart failure 3. Paroxysmal A.Fib/A.flutter  Discharge Medications: Allergies as of 02/16/2020      Reactions   Other Other (See Comments)   Steroids- "BGL goes SKY-HIGH"   Simvastatin Diarrhea, Other (See Comments)   Fatigue, also      Medication List    TAKE these medications   acetaminophen 500 MG tablet Commonly known as: TYLENOL Take 1,000 mg every 8 (eight) hours as needed by mouth for mild pain or headache.   atorvastatin 20 MG tablet Commonly known as: LIPITOR TAKE 1 TABLET BY MOUTH EVERY DAY   Fifty50 Glucose Meter 2.0 w/Device Kit Use as directed.   Fish Oil 1200 MG Caps Take 1,200 mg by mouth in the morning.   fluticasone 50 MCG/ACT nasal spray Commonly known as: FLONASE Place 2 sprays into both nostrils daily as needed for allergies or rhinitis.   FreeStyle Libre 14 Day Sensor Misc Inject 1 patch into the skin every 14 (fourteen) days.   gabapentin 300 MG capsule Commonly known as: NEURONTIN Take 600 mg by mouth at bedtime as needed (for neuropathy).   guaiFENesin-dextromethorphan 100-10 MG/5ML syrup Commonly known as: ROBITUSSIN DM Take 10 mLs by mouth every 4 (four) hours as needed for cough.   insulin lispro 100 UNIT/ML injection Commonly known as: HUMALOG Inject 20-30 Units into the skin 3 (three) times daily before meals.   Insulin Syringe-Needle U-100 31G X 5/16" 0.3 ML Misc use as directed   isosorbide mononitrate 30 MG 24 hr tablet Commonly known as: IMDUR TAKE 1 TABLET BY MOUTH EVERY DAY   losartan 25 MG tablet Commonly known as: COZAAR TAKE 1 TABLET BY MOUTH EVERY DAY   metFORMIN 500 MG 24 hr tablet Commonly known as: GLUCOPHAGE-XR Take 500 mg by mouth  daily with breakfast.   nitroGLYCERIN 0.4 MG SL tablet Commonly known as: NITROSTAT Place 1 tablet (0.4 mg total) under the tongue every 5 (five) minutes as needed for chest pain.   NON FORMULARY Take 25 mg by mouth See admin instructions. CBD oil 25 mg capsules- Take 25 mg (1 capsule) by mouth in the morning   pantoprazole 40 MG tablet Commonly known as: PROTONIX TAKE 1 TABLET BY MOUTH EVERY DAY IN THE EVENING What changed:   how much to take  how to take this  when to take this   potassium chloride SA 20 MEQ tablet Commonly known as: KLOR-CON Take 1 tablet (20 mEq total) by mouth daily. What changed:   how much to take  when to take this  additional instructions   torsemide 20 MG tablet Commonly known as: DEMADEX Take 3 tablets (60 mg total) by mouth 2 (two) times daily. Take 40 mg BID PO M-F and Take 60 mg BID PO Saturdays and Sundays What changed: additional instructions   Toujeo SoloStar 300 UNIT/ML Solostar Pen Generic drug: insulin glargine (1 Unit Dial) Inject 80 Units into the skin daily before breakfast.   warfarin 5 MG tablet Commonly known as: COUMADIN Take as directed. If you are unsure how to take this medication, talk to your nurse or doctor. Original instructions: Take 2 tablets (10 mg total) by mouth daily at 4 PM. What changed:   See the new instructions.  Another medication  Name: Bobby Lindsey MRN: [id number] DOB: 1955-06-13 65 y.o. PCP: Adin Hector, MD  Date of Admission: 02/11/2020  6:02 PM Date of Discharge: 02/16/19 Attending Physician: Axel Filler, *  Discharge Diagnosis: 1. COVID pneumonia 2. Acute on chronic systolic heart failure 3. Paroxysmal A.Fib/A.flutter  Discharge Medications: Allergies as of 02/16/2020      Reactions   Other Other (See Comments)   Steroids- "BGL goes SKY-HIGH"   Simvastatin Diarrhea, Other (See Comments)   Fatigue, also      Medication List    TAKE these medications   acetaminophen 500 MG tablet Commonly known as: TYLENOL Take 1,000 mg every 8 (eight) hours as needed by mouth for mild pain or headache.   atorvastatin 20 MG tablet Commonly known as: LIPITOR TAKE 1 TABLET BY MOUTH EVERY DAY   Fifty50 Glucose Meter 2.0 w/Device Kit Use as directed.   Fish Oil 1200 MG Caps Take 1,200 mg by mouth in the morning.   fluticasone 50 MCG/ACT nasal spray Commonly known as: FLONASE Place 2 sprays into both nostrils daily as needed for allergies or rhinitis.   FreeStyle Libre 14 Day Sensor Misc Inject 1 patch into the skin every 14 (fourteen) days.   gabapentin 300 MG capsule Commonly known as: NEURONTIN Take 600 mg by mouth at bedtime as needed (for neuropathy).   guaiFENesin-dextromethorphan 100-10 MG/5ML syrup Commonly known as: ROBITUSSIN DM Take 10 mLs by mouth every 4 (four) hours as needed for cough.   insulin lispro 100 UNIT/ML injection Commonly known as: HUMALOG Inject 20-30 Units into the skin 3 (three) times daily before meals.   Insulin Syringe-Needle U-100 31G X 5/16" 0.3 ML Misc use as directed   isosorbide mononitrate 30 MG 24 hr tablet Commonly known as: IMDUR TAKE 1 TABLET BY MOUTH EVERY DAY   losartan 25 MG tablet Commonly known as: COZAAR TAKE 1 TABLET BY MOUTH EVERY DAY   metFORMIN 500 MG 24 hr tablet Commonly known as: GLUCOPHAGE-XR Take 500 mg by mouth  daily with breakfast.   nitroGLYCERIN 0.4 MG SL tablet Commonly known as: NITROSTAT Place 1 tablet (0.4 mg total) under the tongue every 5 (five) minutes as needed for chest pain.   NON FORMULARY Take 25 mg by mouth See admin instructions. CBD oil 25 mg capsules- Take 25 mg (1 capsule) by mouth in the morning   pantoprazole 40 MG tablet Commonly known as: PROTONIX TAKE 1 TABLET BY MOUTH EVERY DAY IN THE EVENING What changed:   how much to take  how to take this  when to take this   potassium chloride SA 20 MEQ tablet Commonly known as: KLOR-CON Take 1 tablet (20 mEq total) by mouth daily. What changed:   how much to take  when to take this  additional instructions   torsemide 20 MG tablet Commonly known as: DEMADEX Take 3 tablets (60 mg total) by mouth 2 (two) times daily. Take 40 mg BID PO M-F and Take 60 mg BID PO Saturdays and Sundays What changed: additional instructions   Toujeo SoloStar 300 UNIT/ML Solostar Pen Generic drug: insulin glargine (1 Unit Dial) Inject 80 Units into the skin daily before breakfast.   warfarin 5 MG tablet Commonly known as: COUMADIN Take as directed. If you are unsure how to take this medication, talk to your nurse or doctor. Original instructions: Take 2 tablets (10 mg total) by mouth daily at 4 PM. What changed:   See the new instructions.  Another medication   Name: Bobby Lindsey MRN: [id number] DOB: 09/04/1955 64 y.o. PCP: Klein, Bert J III, MD  Date of Admission: 02/11/2020  6:02 PM Date of Discharge: 02/16/19 Attending Physician: Vincent, Duncan Ladarien, *  Discharge Diagnosis: 1. COVID pneumonia 2. Acute on chronic systolic heart failure 3. Paroxysmal A.Fib/A.flutter  Discharge Medications: Allergies as of 02/16/2020      Reactions   Other Other (See Comments)   Steroids- "BGL goes SKY-HIGH"   Simvastatin Diarrhea, Other (See Comments)   Fatigue, also      Medication List    TAKE these medications   acetaminophen 500 MG tablet Commonly known as: TYLENOL Take 1,000 mg every 8 (eight) hours as needed by mouth for mild pain or headache.   atorvastatin 20 MG tablet Commonly known as: LIPITOR TAKE 1 TABLET BY MOUTH EVERY DAY   Fifty50 Glucose Meter 2.0 w/Device Kit Use as directed.   Fish Oil 1200 MG Caps Take 1,200 mg by mouth in the morning.   fluticasone 50 MCG/ACT nasal spray Commonly known as: FLONASE Place 2 sprays into both nostrils daily as needed for allergies or rhinitis.   FreeStyle Libre 14 Day Sensor Misc Inject 1 patch into the skin every 14 (fourteen) days.   gabapentin 300 MG capsule Commonly known as: NEURONTIN Take 600 mg by mouth at bedtime as needed (for neuropathy).   guaiFENesin-dextromethorphan 100-10 MG/5ML syrup Commonly known as: ROBITUSSIN DM Take 10 mLs by mouth every 4 (four) hours as needed for cough.   insulin lispro 100 UNIT/ML injection Commonly known as: HUMALOG Inject 20-30 Units into the skin 3 (three) times daily before meals.   Insulin Syringe-Needle U-100 31G X 5/16" 0.3 ML Misc use as directed   isosorbide mononitrate 30 MG 24 hr tablet Commonly known as: IMDUR TAKE 1 TABLET BY MOUTH EVERY DAY   losartan 25 MG tablet Commonly known as: COZAAR TAKE 1 TABLET BY MOUTH EVERY DAY   metFORMIN 500 MG 24 hr tablet Commonly known as: GLUCOPHAGE-XR Take 500 mg by mouth  daily with breakfast.   nitroGLYCERIN 0.4 MG SL tablet Commonly known as: NITROSTAT Place 1 tablet (0.4 mg total) under the tongue every 5 (five) minutes as needed for chest pain.   NON FORMULARY Take 25 mg by mouth See admin instructions. CBD oil 25 mg capsules- Take 25 mg (1 capsule) by mouth in the morning   pantoprazole 40 MG tablet Commonly known as: PROTONIX TAKE 1 TABLET BY MOUTH EVERY DAY IN THE EVENING What changed:   how much to take  how to take this  when to take this   potassium chloride SA 20 MEQ tablet Commonly known as: KLOR-CON Take 1 tablet (20 mEq total) by mouth daily. What changed:   how much to take  when to take this  additional instructions   torsemide 20 MG tablet Commonly known as: DEMADEX Take 3 tablets (60 mg total) by mouth 2 (two) times daily. Take 40 mg BID PO M-F and Take 60 mg BID PO Saturdays and Sundays What changed: additional instructions   Toujeo SoloStar 300 UNIT/ML Solostar Pen Generic drug: insulin glargine (1 Unit Dial) Inject 80 Units into the skin daily before breakfast.   warfarin 5 MG tablet Commonly known as: COUMADIN Take as directed. If you are unsure how to take this medication, talk to your nurse or doctor. Original instructions: Take 2 tablets (10 mg total) by mouth daily at 4 PM. What changed:   See the new instructions.  Another medication

## 2020-02-16 NOTE — Progress Notes (Signed)
   Subjective:  Bobby Lindsey is a 65 y.o. with PMH of pulmonary HTN, T2DM, CKD3, HTN, HLD, CAD s/p CABG, A.fib, Recurrent DVT/PEs admit for COVID pneumonia on hospital day 4  Patient reports not feeling well this morning. States he had a sore throat last night and continues to have drainage. Denies chest pain. He has 7 family members who have covid. He states his wife has covid and will not be able to take care of him. He reports he has supplemental oxygen at home.   Objective:  Vital signs in last 24 hours: Vitals:   02/15/20 1409 02/15/20 1734 02/15/20 2053 02/16/20 0423  BP: 111/60 (!) 161/72 134/64 (!) 159/88  Pulse: 63 (!) 50 60 (!) 53  Resp: (!) 21 (!) 23 20 18   Temp: 98 F (36.7 C)  99 F (37.2 C) 98.9 F (37.2 C)  TempSrc: Oral   Oral  SpO2: 92% 93% 94% 96%  Weight:      Height:       Gen: Well-developed, chronically ill-appearing HEENT: NCAT head, hearing intact, EOMI, MMM, [place] on 2L Neck: supple, ROM intact Pulm: CTAB, no rales, no wheezes, frequent coughs Extm: Stable RLE bka Skin: Dry, Warm, normal turgor, faded surgical scars on chest Neuro: AAOx3 Psych: Normal mood and affect  Assessment/Plan:  Principal Problem:   COVID-19 Active Problems:   Diabetes (HCC)   Paroxysmal atrial fibrillation (HCC)   Acute on chronic HFrEF (heart failure with reduced ejection fraction) (HCC)   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  Acute hypoxic respiratory failure 2/2 COVID pneumonia Acute on chronic systolic heart failure Admit COVID positive. Received 5 day course of dexamethasone. Last dose today. Diuresed 2.5L yesterday. Current exam euvolemic. Ambulated yesterday with 6 O2sat with 2L [place]. Has home oxygen. AM labs  pending - D/c today pending no acute abnormalities on lab - C/w incentive spirometry, flutter valve - C/w home torsemide - Monitor O2 sat  Uncontrolled DM2 On lantus 75 units qhs, novolog 12 . Am cbg 122 - SSI - Novolog 12 units TID qc - Lantus 75 units qhs - glucose checks  PAF Recurrent DVT/PE  Hx of Mobitz type II Av block HR 53 this am. BP stable. CHADSVASC2 score >4. On warfarin - C/w warfarin per pharmacy - Resume beta-blockers at discharge  DVT prophx: Warfarin Diet: HH/CM Bowel: MIralax Code: Full  Prior to Admission Living Arrangement: Home Anticipated Discharge Location: Home Barriers to Discharge: None Dispo: Anticipated discharge in approximately 0-1 day(s).   Bobby Anis, MD 02/16/2020, 6:10 AM Pager: [phone] After 5pm on weekdays and 1pm on weekends: On Call Pager: [phone]

## 2020-02-16 NOTE — Progress Notes (Addendum)
ANTICOAGULATION CONSULT NOTE   Pharmacy Consult for Warfarin Indication: Hx of PE, DVT, and afib   Patient Measurements: Height: 5\' 11"  (180.3 cm) Weight: 117.5 kg (259 lb) IBW/kg (Calculated) : 75.3 Heparin Dosing Weight:   Vital Signs: Temp: 98.9 F (37.2 C) (01/03 0423) Temp Source: Oral (01/03 0423) BP: 159/88 (01/03 0423) Pulse Rate: 53 (01/03 0423)  Labs: Recent Labs    02/14/20 0555 02/15/20 0310 02/16/20 0950  HGB 14.7 14.5 16.9  HCT 43.5 45.1 51.6  PLT 126* 98* 118*  LABPROT 19.6* 18.3* 16.2*  INR 1.7* 1.6* 1.4*  CREATININE 1.34* 1.38* 1.43*    Estimated Creatinine Clearance: 67.2 mL/min (A) (by C-G formula based on SCr of 1.43 mg/dL (H)).   Assessment: Patient is a 7 yom that is being admitted for COVID. Patient is on warfarin at home for history of a PE, DVT, and afib.  PTA schedule: 9 mg every day EXCEPT 5 mg on Monday and Fridays.   INR 1.4 this am   CBC stable  Not in afib - no need for bridge to therapeutic INR per Dr Truman Hayward  Goal of Therapy:  INR 2-3 Monitor platelets by anticoagulation protocol: Yes   Plan:  Warfarin 10 mg x 1 Daily inr  Barth Kirks, PharmD, BCPS, BCCCP Clinical Pharmacist  Please check AMION for all [place] numbers  02/16/2020 10:48 AM

## 2020-02-17 LAB — CULTURE, BLOOD (ROUTINE X 2)
Culture: NO GROWTH
Culture: NO GROWTH
Special Requests: ADEQUATE
Special Requests: ADEQUATE

## 2020-02-18 ENCOUNTER — Other Ambulatory Visit: Payer: Self-pay

## 2020-02-18 ENCOUNTER — Inpatient Hospital Stay
Admission: EM | Admit: 2020-02-18 | Discharge: 2020-03-16 | DRG: 250 | Disposition: E | Payer: Medicare HMO | Attending: Internal Medicine | Admitting: Internal Medicine

## 2020-02-18 ENCOUNTER — Encounter: Admission: EM | Disposition: E | Payer: Self-pay | Source: Home / Self Care | Attending: Internal Medicine

## 2020-02-18 ENCOUNTER — Inpatient Hospital Stay: Payer: Medicare HMO

## 2020-02-18 ENCOUNTER — Telehealth: Payer: Self-pay | Admitting: Cardiology

## 2020-02-18 DIAGNOSIS — N1831 Chronic kidney disease, stage 3a: Secondary | ICD-10-CM | POA: Diagnosis present

## 2020-02-18 DIAGNOSIS — Z6834 Body mass index (BMI) 34.0-34.9, adult: Secondary | ICD-10-CM

## 2020-02-18 DIAGNOSIS — J440 Chronic obstructive pulmonary disease with acute lower respiratory infection: Secondary | ICD-10-CM | POA: Diagnosis present

## 2020-02-18 DIAGNOSIS — N179 Acute kidney failure, unspecified: Secondary | ICD-10-CM

## 2020-02-18 DIAGNOSIS — T508X5A Adverse effect of diagnostic agents, initial encounter: Secondary | ICD-10-CM | POA: Diagnosis present

## 2020-02-18 DIAGNOSIS — Z452 Encounter for adjustment and management of vascular access device: Secondary | ICD-10-CM

## 2020-02-18 DIAGNOSIS — I2119 ST elevation (STEMI) myocardial infarction involving other coronary artery of inferior wall: Secondary | ICD-10-CM | POA: Diagnosis present

## 2020-02-18 DIAGNOSIS — I255 Ischemic cardiomyopathy: Secondary | ICD-10-CM | POA: Diagnosis present

## 2020-02-18 DIAGNOSIS — K219 Gastro-esophageal reflux disease without esophagitis: Secondary | ICD-10-CM | POA: Diagnosis present

## 2020-02-18 DIAGNOSIS — Z515 Encounter for palliative care: Secondary | ICD-10-CM

## 2020-02-18 DIAGNOSIS — J9602 Acute respiratory failure with hypercapnia: Secondary | ICD-10-CM | POA: Diagnosis not present

## 2020-02-18 DIAGNOSIS — Z66 Do not resuscitate: Secondary | ICD-10-CM | POA: Diagnosis not present

## 2020-02-18 DIAGNOSIS — N170 Acute kidney failure with tubular necrosis: Secondary | ICD-10-CM | POA: Diagnosis present

## 2020-02-18 DIAGNOSIS — I213 ST elevation (STEMI) myocardial infarction of unspecified site: Secondary | ICD-10-CM | POA: Diagnosis present

## 2020-02-18 DIAGNOSIS — T4275XA Adverse effect of unspecified antiepileptic and sedative-hypnotic drugs, initial encounter: Secondary | ICD-10-CM | POA: Diagnosis present

## 2020-02-18 DIAGNOSIS — J1282 Pneumonia due to coronavirus disease 2019: Secondary | ICD-10-CM | POA: Diagnosis present

## 2020-02-18 DIAGNOSIS — Z86711 Personal history of pulmonary embolism: Secondary | ICD-10-CM

## 2020-02-18 DIAGNOSIS — E785 Hyperlipidemia, unspecified: Secondary | ICD-10-CM | POA: Diagnosis present

## 2020-02-18 DIAGNOSIS — J9601 Acute respiratory failure with hypoxia: Secondary | ICD-10-CM

## 2020-02-18 DIAGNOSIS — R570 Cardiogenic shock: Secondary | ICD-10-CM | POA: Diagnosis not present

## 2020-02-18 DIAGNOSIS — R7989 Other specified abnormal findings of blood chemistry: Secondary | ICD-10-CM | POA: Diagnosis present

## 2020-02-18 DIAGNOSIS — E1142 Type 2 diabetes mellitus with diabetic polyneuropathy: Secondary | ICD-10-CM | POA: Diagnosis present

## 2020-02-18 DIAGNOSIS — Z89519 Acquired absence of unspecified leg below knee: Secondary | ICD-10-CM

## 2020-02-18 DIAGNOSIS — R6521 Severe sepsis with septic shock: Secondary | ICD-10-CM | POA: Diagnosis not present

## 2020-02-18 DIAGNOSIS — J80 Acute respiratory distress syndrome: Secondary | ICD-10-CM | POA: Diagnosis present

## 2020-02-18 DIAGNOSIS — Z888 Allergy status to other drugs, medicaments and biological substances status: Secondary | ICD-10-CM

## 2020-02-18 DIAGNOSIS — I4892 Unspecified atrial flutter: Secondary | ICD-10-CM | POA: Diagnosis present

## 2020-02-18 DIAGNOSIS — E1151 Type 2 diabetes mellitus with diabetic peripheral angiopathy without gangrene: Secondary | ICD-10-CM | POA: Diagnosis present

## 2020-02-18 DIAGNOSIS — I5043 Acute on chronic combined systolic (congestive) and diastolic (congestive) heart failure: Secondary | ICD-10-CM | POA: Diagnosis present

## 2020-02-18 DIAGNOSIS — Z951 Presence of aortocoronary bypass graft: Secondary | ICD-10-CM | POA: Diagnosis not present

## 2020-02-18 DIAGNOSIS — Z86718 Personal history of other venous thrombosis and embolism: Secondary | ICD-10-CM

## 2020-02-18 DIAGNOSIS — R0602 Shortness of breath: Secondary | ICD-10-CM

## 2020-02-18 DIAGNOSIS — I252 Old myocardial infarction: Secondary | ICD-10-CM

## 2020-02-18 DIAGNOSIS — N189 Chronic kidney disease, unspecified: Secondary | ICD-10-CM | POA: Diagnosis not present

## 2020-02-18 DIAGNOSIS — G4733 Obstructive sleep apnea (adult) (pediatric): Secondary | ICD-10-CM | POA: Diagnosis present

## 2020-02-18 DIAGNOSIS — E1165 Type 2 diabetes mellitus with hyperglycemia: Secondary | ICD-10-CM | POA: Diagnosis present

## 2020-02-18 DIAGNOSIS — I2101 ST elevation (STEMI) myocardial infarction involving left main coronary artery: Secondary | ICD-10-CM

## 2020-02-18 DIAGNOSIS — I2510 Atherosclerotic heart disease of native coronary artery without angina pectoris: Secondary | ICD-10-CM | POA: Diagnosis present

## 2020-02-18 DIAGNOSIS — D689 Coagulation defect, unspecified: Secondary | ICD-10-CM | POA: Diagnosis present

## 2020-02-18 DIAGNOSIS — I130 Hypertensive heart and chronic kidney disease with heart failure and stage 1 through stage 4 chronic kidney disease, or unspecified chronic kidney disease: Secondary | ICD-10-CM | POA: Diagnosis present

## 2020-02-18 DIAGNOSIS — Z8249 Family history of ischemic heart disease and other diseases of the circulatory system: Secondary | ICD-10-CM

## 2020-02-18 DIAGNOSIS — I4891 Unspecified atrial fibrillation: Secondary | ICD-10-CM | POA: Diagnosis present

## 2020-02-18 DIAGNOSIS — J9600 Acute respiratory failure, unspecified whether with hypoxia or hypercapnia: Secondary | ICD-10-CM

## 2020-02-18 DIAGNOSIS — U071 COVID-19: Secondary | ICD-10-CM | POA: Diagnosis present

## 2020-02-18 DIAGNOSIS — I25810 Atherosclerosis of coronary artery bypass graft(s) without angina pectoris: Secondary | ICD-10-CM | POA: Diagnosis present

## 2020-02-18 DIAGNOSIS — E875 Hyperkalemia: Secondary | ICD-10-CM | POA: Diagnosis present

## 2020-02-18 DIAGNOSIS — K5900 Constipation, unspecified: Secondary | ICD-10-CM

## 2020-02-18 DIAGNOSIS — Z794 Long term (current) use of insulin: Secondary | ICD-10-CM

## 2020-02-18 DIAGNOSIS — E872 Acidosis: Secondary | ICD-10-CM | POA: Diagnosis present

## 2020-02-18 DIAGNOSIS — E871 Hypo-osmolality and hyponatremia: Secondary | ICD-10-CM | POA: Diagnosis present

## 2020-02-18 DIAGNOSIS — E1122 Type 2 diabetes mellitus with diabetic chronic kidney disease: Secondary | ICD-10-CM | POA: Diagnosis present

## 2020-02-18 DIAGNOSIS — G928 Other toxic encephalopathy: Secondary | ICD-10-CM | POA: Diagnosis present

## 2020-02-18 DIAGNOSIS — R34 Anuria and oliguria: Secondary | ICD-10-CM | POA: Diagnosis present

## 2020-02-18 DIAGNOSIS — N141 Nephropathy induced by other drugs, medicaments and biological substances: Secondary | ICD-10-CM | POA: Diagnosis present

## 2020-02-18 DIAGNOSIS — Z79899 Other long term (current) drug therapy: Secondary | ICD-10-CM

## 2020-02-18 DIAGNOSIS — I5021 Acute systolic (congestive) heart failure: Secondary | ICD-10-CM | POA: Diagnosis not present

## 2020-02-18 DIAGNOSIS — R579 Shock, unspecified: Secondary | ICD-10-CM | POA: Diagnosis not present

## 2020-02-18 DIAGNOSIS — A4189 Other specified sepsis: Secondary | ICD-10-CM | POA: Diagnosis not present

## 2020-02-18 DIAGNOSIS — Z01818 Encounter for other preprocedural examination: Secondary | ICD-10-CM

## 2020-02-18 DIAGNOSIS — Z7901 Long term (current) use of anticoagulants: Secondary | ICD-10-CM

## 2020-02-18 DIAGNOSIS — I5022 Chronic systolic (congestive) heart failure: Secondary | ICD-10-CM | POA: Diagnosis not present

## 2020-02-18 DIAGNOSIS — E118 Type 2 diabetes mellitus with unspecified complications: Secondary | ICD-10-CM | POA: Diagnosis not present

## 2020-02-18 DIAGNOSIS — Z4659 Encounter for fitting and adjustment of other gastrointestinal appliance and device: Secondary | ICD-10-CM

## 2020-02-18 DIAGNOSIS — R778 Other specified abnormalities of plasma proteins: Secondary | ICD-10-CM | POA: Diagnosis present

## 2020-02-18 HISTORY — PX: LEFT HEART CATH AND CORONARY ANGIOGRAPHY: CATH118249

## 2020-02-18 HISTORY — PX: CORONARY/GRAFT ACUTE MI REVASCULARIZATION: CATH118305

## 2020-02-18 HISTORY — PX: CORONARY BALLOON ANGIOPLASTY: CATH118233

## 2020-02-18 LAB — HEPATIC FUNCTION PANEL
ALT: 33 U/L (ref 0–44)
AST: 74 U/L — ABNORMAL HIGH (ref 15–41)
Albumin: 2.8 g/dL — ABNORMAL LOW (ref 3.5–5.0)
Alkaline Phosphatase: 53 U/L (ref 38–126)
Bilirubin, Direct: 0.3 mg/dL — ABNORMAL HIGH (ref 0.0–0.2)
Indirect Bilirubin: 0.7 mg/dL (ref 0.3–0.9)
Total Bilirubin: 1 mg/dL (ref 0.3–1.2)
Total Protein: 6.5 g/dL (ref 6.5–8.1)

## 2020-02-18 LAB — BASIC METABOLIC PANEL
Anion gap: 13 (ref 5–15)
BUN: 93 mg/dL — ABNORMAL HIGH (ref 8–23)
CO2: 27 mmol/L (ref 22–32)
Calcium: 8.4 mg/dL — ABNORMAL LOW (ref 8.9–10.3)
Chloride: 89 mmol/L — ABNORMAL LOW (ref 98–111)
Creatinine, Ser: 2.61 mg/dL — ABNORMAL HIGH (ref 0.61–1.24)
GFR, Estimated: 26 mL/min — ABNORMAL LOW (ref >60)
Glucose, Bld: 277 mg/dL — ABNORMAL HIGH (ref 70–99)
Potassium: 4.6 mmol/L (ref 3.5–5.1)
Sodium: 129 mmol/L — ABNORMAL LOW (ref 135–145)

## 2020-02-18 LAB — LIPID PANEL
Cholesterol: 93 mg/dL (ref 0–200)
HDL: 31 mg/dL — ABNORMAL LOW (ref >40)
LDL Cholesterol: 41 mg/dL (ref 0–99)
Total CHOL/HDL Ratio: 3 RATIO
Triglycerides: 106 mg/dL (ref <150)
VLDL: 21 mg/dL (ref 0–40)

## 2020-02-18 LAB — PROTIME-INR
INR: 2.2 — ABNORMAL HIGH (ref 0.8–1.2)
Prothrombin Time: 24 seconds — ABNORMAL HIGH (ref 11.4–15.2)

## 2020-02-18 LAB — CBC WITH DIFFERENTIAL/PLATELET
Abs Immature Granulocytes: 0.04 10*3/uL (ref 0.00–0.07)
Basophils Absolute: 0 10*3/uL (ref 0.0–0.1)
Basophils Relative: 0 %
Eosinophils Absolute: 0.2 10*3/uL (ref 0.0–0.5)
Eosinophils Relative: 2 %
HCT: 49.3 % (ref 39.0–52.0)
Hemoglobin: 16.3 g/dL (ref 13.0–17.0)
Immature Granulocytes: 1 %
Lymphocytes Relative: 14 %
Lymphs Abs: 1 10*3/uL (ref 0.7–4.0)
MCH: 28 pg (ref 26.0–34.0)
MCHC: 33.1 g/dL (ref 30.0–36.0)
MCV: 84.7 fL (ref 80.0–100.0)
Monocytes Absolute: 0.5 10*3/uL (ref 0.1–1.0)
Monocytes Relative: 8 %
Neutro Abs: 5.1 10*3/uL (ref 1.7–7.7)
Neutrophils Relative %: 75 %
Platelets: 117 10*3/uL — ABNORMAL LOW (ref 150–400)
RBC: 5.82 MIL/uL — ABNORMAL HIGH (ref 4.22–5.81)
RDW: 15.2 % (ref 11.5–15.5)
WBC: 6.8 10*3/uL (ref 4.0–10.5)
nRBC: 0 % (ref 0.0–0.2)

## 2020-02-18 LAB — FERRITIN: Ferritin: 1127 ng/mL — ABNORMAL HIGH (ref 24–336)

## 2020-02-18 LAB — POCT ACTIVATED CLOTTING TIME: Activated Clotting Time: 535 seconds

## 2020-02-18 LAB — PROCALCITONIN: Procalcitonin: 0.99 ng/mL

## 2020-02-18 LAB — APTT: aPTT: 45 seconds — ABNORMAL HIGH (ref 24–36)

## 2020-02-18 LAB — GLUCOSE, CAPILLARY: Glucose-Capillary: 143 mg/dL — ABNORMAL HIGH (ref 70–99)

## 2020-02-18 LAB — TROPONIN I (HIGH SENSITIVITY)
Troponin I (High Sensitivity): 4704 ng/L (ref <18)
Troponin I (High Sensitivity): 5901 ng/L (ref <18)

## 2020-02-18 LAB — HEMOGLOBIN A1C
Hgb A1c MFr Bld: 9.5 % — ABNORMAL HIGH (ref 4.8–5.6)
Mean Plasma Glucose: 225.95 mg/dL

## 2020-02-18 SURGERY — CORONARY/GRAFT ACUTE MI REVASCULARIZATION
Anesthesia: Moderate Sedation

## 2020-02-18 MED ORDER — HEPARIN SODIUM (PORCINE) 5000 UNIT/ML IJ SOLN
4000.0000 [IU] | Freq: Once | INTRAMUSCULAR | Status: AC
  Administered 2020-02-18: 4000 [IU] via INTRAVENOUS

## 2020-02-18 MED ORDER — FOLIC ACID 1 MG PO TABS
1.0000 mg | ORAL_TABLET | Freq: Every day | ORAL | Status: DC
  Administered 2020-02-19 – 2020-02-21 (×3): 1 mg via ORAL
  Filled 2020-02-18 (×3): qty 1

## 2020-02-18 MED ORDER — HEPARIN (PORCINE) 25000 UT/250ML-% IV SOLN
1400.0000 [IU]/h | INTRAVENOUS | Status: DC
  Administered 2020-02-18: 1400 [IU]/h via INTRAVENOUS
  Filled 2020-02-18: qty 250

## 2020-02-18 MED ORDER — MIDAZOLAM HCL 2 MG/2ML IJ SOLN
INTRAMUSCULAR | Status: DC | PRN
  Administered 2020-02-18: 1 mg via INTRAVENOUS

## 2020-02-18 MED ORDER — IOHEXOL 300 MG/ML  SOLN
INTRAMUSCULAR | Status: DC | PRN
  Administered 2020-02-18: 220 mL

## 2020-02-18 MED ORDER — FLUTICASONE PROPIONATE 50 MCG/ACT NA SUSP
2.0000 | Freq: Every day | NASAL | Status: DC | PRN
  Filled 2020-02-18: qty 16

## 2020-02-18 MED ORDER — MIDAZOLAM HCL 2 MG/2ML IJ SOLN
INTRAMUSCULAR | Status: AC
  Filled 2020-02-18: qty 2

## 2020-02-18 MED ORDER — BIVALIRUDIN BOLUS VIA INFUSION - CUPID
INTRAVENOUS | Status: DC | PRN
  Administered 2020-02-18: 88.425 mg via INTRAVENOUS

## 2020-02-18 MED ORDER — SODIUM CHLORIDE 0.9 % IV SOLN
2.0000 g | Freq: Two times a day (BID) | INTRAVENOUS | Status: DC
  Administered 2020-02-18 – 2020-02-19 (×2): 2 g via INTRAVENOUS
  Filled 2020-02-18 (×3): qty 2

## 2020-02-18 MED ORDER — SODIUM CHLORIDE 0.9 % IV SOLN: INTRAVENOUS | Status: DC

## 2020-02-18 MED ORDER — BIVALIRUDIN TRIFLUOROACETATE 250 MG IV SOLR
INTRAVENOUS | Status: AC
  Filled 2020-02-18: qty 250

## 2020-02-18 MED ORDER — FENTANYL CITRATE (PF) 100 MCG/2ML IJ SOLN
INTRAMUSCULAR | Status: DC | PRN
  Administered 2020-02-18: 50 ug via INTRAVENOUS

## 2020-02-18 MED ORDER — ASCORBIC ACID 500 MG PO TABS
500.0000 mg | ORAL_TABLET | Freq: Every day | ORAL | Status: DC
  Administered 2020-02-19 – 2020-02-21 (×3): 500 mg via ORAL
  Filled 2020-02-18 (×3): qty 1

## 2020-02-18 MED ORDER — HYDROCOD POLST-CPM POLST ER 10-8 MG/5ML PO SUER: 5.0000 mL | Freq: Two times a day (BID) | ORAL | Status: DC | PRN

## 2020-02-18 MED ORDER — GABAPENTIN 300 MG PO CAPS: 600.0000 mg | ORAL_CAPSULE | Freq: Every evening | ORAL | Status: DC | PRN

## 2020-02-18 MED ORDER — NOREPINEPHRINE 4 MG/250ML-% IV SOLN
2.0000 ug/min | INTRAVENOUS | Status: DC
  Administered 2020-02-18: 2 ug/min via INTRAVENOUS
  Filled 2020-02-18: qty 250

## 2020-02-18 MED ORDER — METHYLPREDNISOLONE SODIUM SUCC 40 MG IJ SOLR
20.0000 mg | Freq: Every day | INTRAMUSCULAR | Status: DC
  Administered 2020-02-18 – 2020-02-21 (×4): 20 mg via INTRAVENOUS
  Filled 2020-02-18 (×4): qty 1

## 2020-02-18 MED ORDER — FENTANYL CITRATE (PF) 100 MCG/2ML IJ SOLN
INTRAMUSCULAR | Status: AC
  Filled 2020-02-18: qty 2

## 2020-02-18 MED ORDER — NOREPINEPHRINE BITARTRATE 1 MG/ML IV SOLN
INTRAVENOUS | Status: AC | PRN
  Administered 2020-02-18: 2 ug/min via INTRAVENOUS

## 2020-02-18 MED ORDER — METHYLPREDNISOLONE SODIUM SUCC 125 MG IJ SOLR: 0.5000 mg/kg | Freq: Two times a day (BID) | INTRAMUSCULAR | Status: DC

## 2020-02-18 MED ORDER — INSULIN ASPART 100 UNIT/ML ~~LOC~~ SOLN
0.0000 [IU] | SUBCUTANEOUS | Status: DC
  Administered 2020-02-18: 2 [IU] via SUBCUTANEOUS
  Administered 2020-02-18 – 2020-02-19 (×3): 3 [IU] via SUBCUTANEOUS
  Administered 2020-02-19: 11 [IU] via SUBCUTANEOUS
  Administered 2020-02-19: 8 [IU] via SUBCUTANEOUS
  Administered 2020-02-19: 3 [IU] via SUBCUTANEOUS
  Filled 2020-02-18 (×7): qty 1

## 2020-02-18 MED ORDER — PREDNISONE 50 MG PO TABS: 50.0000 mg | ORAL_TABLET | Freq: Every day | ORAL | Status: DC

## 2020-02-18 MED ORDER — MORPHINE SULFATE (PF) 2 MG/ML IV SOLN
1.0000 mg | INTRAVENOUS | Status: DC | PRN
  Administered 2020-02-19 (×2): 2 mg via INTRAVENOUS
  Filled 2020-02-18 (×4): qty 1

## 2020-02-18 MED ORDER — ADULT MULTIVITAMIN W/MINERALS CH
1.0000 | ORAL_TABLET | Freq: Every day | ORAL | Status: DC
  Administered 2020-02-19 – 2020-02-21 (×3): 1 via ORAL
  Filled 2020-02-18 (×3): qty 1

## 2020-02-18 MED ORDER — SODIUM CHLORIDE 0.9 % IV SOLN
250.0000 mL | INTRAVENOUS | Status: DC
  Administered 2020-02-18: 250 mL via INTRAVENOUS

## 2020-02-18 MED ORDER — LIDOCAINE HCL (PF) 1 % IJ SOLN
INTRAMUSCULAR | Status: AC
  Filled 2020-02-18: qty 30

## 2020-02-18 MED ORDER — SODIUM CHLORIDE 0.9 % IV SOLN
INTRAVENOUS | Status: AC | PRN
  Administered 2020-02-18: 250 mL via INTRAVENOUS

## 2020-02-18 MED ORDER — ACETAMINOPHEN 325 MG PO TABS
650.0000 mg | ORAL_TABLET | Freq: Four times a day (QID) | ORAL | Status: DC | PRN
  Administered 2020-02-20: 650 mg via ORAL
  Filled 2020-02-18: qty 2

## 2020-02-18 MED ORDER — SODIUM CHLORIDE 0.9 % IV SOLN
INTRAVENOUS | Status: DC | PRN
  Administered 2020-02-18: 1.75 mg/kg/h via INTRAVENOUS

## 2020-02-18 MED ORDER — ASPIRIN 81 MG PO CHEW
324.0000 mg | CHEWABLE_TABLET | Freq: Once | ORAL | Status: AC
  Administered 2020-02-18: 324 mg via ORAL

## 2020-02-18 MED ORDER — ZINC SULFATE 220 (50 ZN) MG PO CAPS
220.0000 mg | ORAL_CAPSULE | Freq: Every day | ORAL | Status: DC
  Administered 2020-02-19 – 2020-02-21 (×3): 220 mg via ORAL
  Filled 2020-02-18 (×3): qty 1

## 2020-02-18 MED ORDER — GUAIFENESIN-DM 100-10 MG/5ML PO SYRP
10.0000 mL | ORAL_SOLUTION | ORAL | Status: DC | PRN
  Administered 2020-02-20: 10 mL via ORAL
  Filled 2020-02-18: qty 10

## 2020-02-18 MED ORDER — SODIUM CHLORIDE 0.9 % IV SOLN: 200.0000 mg | Freq: Once | INTRAVENOUS | Status: DC

## 2020-02-18 MED ORDER — TICAGRELOR 90 MG PO TABS
ORAL_TABLET | ORAL | Status: AC
  Filled 2020-02-18: qty 2

## 2020-02-18 MED ORDER — PANTOPRAZOLE SODIUM 40 MG IV SOLR
40.0000 mg | INTRAVENOUS | Status: DC
  Administered 2020-02-18 – 2020-02-20 (×3): 40 mg via INTRAVENOUS
  Filled 2020-02-18 (×3): qty 40

## 2020-02-18 MED ORDER — NOREPINEPHRINE BITARTRATE 1 MG/ML IV SOLN
INTRAVENOUS | Status: AC
  Filled 2020-02-18: qty 4

## 2020-02-18 MED ORDER — HEPARIN (PORCINE) IN NACL 1000-0.9 UT/500ML-% IV SOLN
INTRAVENOUS | Status: DC | PRN
  Administered 2020-02-18: 500 mL

## 2020-02-18 MED ORDER — INSULIN GLARGINE 100 UNIT/ML ~~LOC~~ SOLN
15.0000 [IU] | Freq: Every day | SUBCUTANEOUS | Status: DC
  Administered 2020-02-18: 15 [IU] via SUBCUTANEOUS
  Filled 2020-02-18 (×2): qty 0.15

## 2020-02-18 MED ORDER — SODIUM CHLORIDE 0.9 % IV SOLN: 100.0000 mg | Freq: Every day | INTRAVENOUS | Status: DC

## 2020-02-18 MED ORDER — THIAMINE HCL 100 MG PO TABS
100.0000 mg | ORAL_TABLET | Freq: Every day | ORAL | Status: DC
  Administered 2020-02-19 – 2020-02-21 (×3): 100 mg via ORAL
  Filled 2020-02-18 (×3): qty 1

## 2020-02-18 SURGICAL SUPPLY — 18 items
BALLN TREK RX 2.5X20 (BALLOONS) ×2
BALLOON TREK RX 2.5X20 (BALLOONS) ×1 IMPLANT
CATH INFINITI 5 FR IM (CATHETERS) ×2 IMPLANT
CATH INFINITI 5 FR MPA2 (CATHETERS) ×2 IMPLANT
CATH INFINITI 5FR JL4 (CATHETERS) ×2 IMPLANT
CATH INFINITI JR4 5F (CATHETERS) ×2 IMPLANT
CATH VISTA GUIDE 6FR JR4 SH (CATHETERS) ×2 IMPLANT
DEVICE CLOSURE MYNXGRIP 6/7F (Vascular Products) ×2 IMPLANT
GUIDEWIRE INQWIRE 1.5J.035X260 (WIRE) IMPLANT
INQWIRE 1.5J .035X260CM (WIRE)
KIT ENCORE 26 ADVANTAGE (KITS) ×2 IMPLANT
KIT MANI 3VAL PERCEP (MISCELLANEOUS) ×2 IMPLANT
NEEDLE PERC 18GX7CM (NEEDLE) ×2 IMPLANT
PACK CARDIAC CATH (CUSTOM PROCEDURE TRAY) ×2 IMPLANT
SHEATH AVANTI 6FR X 11CM (SHEATH) ×2 IMPLANT
WIRE EMERALD 3MM-J .035X260CM (WIRE) ×2 IMPLANT
WIRE G HI TQ BMW 190 (WIRE) ×2 IMPLANT
WIRE GUIDERIGHT .035X150 (WIRE) ×2 IMPLANT

## 2020-02-18 NOTE — ED Notes (Signed)
Dr. Clayborn Bigness at bedside at this time.

## 2020-02-18 NOTE — Plan of Care (Signed)
  Problem: Clinical Measurements: Goal: Will remain free from infection Outcome: Progressing Goal: Diagnostic test results will improve Outcome: Progressing Goal: Respiratory complications will improve Outcome: Progressing Goal: Cardiovascular complication will be avoided Outcome: Progressing   Problem: Nutrition: Goal: Adequate nutrition will be maintained Outcome: Progressing   Problem: Pain Managment: Goal: General experience of comfort will improve Outcome: Progressing

## 2020-02-18 NOTE — ED Provider Notes (Signed)
[facility] Emergency Department Provider Note    ____________________________________________   I have reviewed the triage vital signs and the nursing notes.   HISTORY  Chief Complaint Code STEMI   History limited by: Not Limited   HPI Bobby Lindsey is a 65 y.o. male who presents to the emergency department today because of concerns for worsening shortness of breath.  Patient had a recent hospitalization at [facility] for [place].  He was discharged from the hospital 2 days ago. States shortness of breath got worse today. The patient denies any associated chest pain. The patient states that he does not think he has had any further fevers.   Records reviewed. Per medical record review patient has a history of CABG, CAD.  Past Medical History:  Diagnosis Date  . Anginal pain ([place])    last pm  . Atrial fibrillation ([place])    a. Transient during 03/2013 admission.  . Bradycardia    a. Bradycardia/pauses/possible Mobitz II during 03/2013 adm. Not on BB due to this.  Marland Kitchen CAD (coronary artery disease)    a. s/p CABG 2002. b. Hx Cypher stent to the RCA. c. Inf-lat STEMI 03/2013:  LHC (04/05/13):  mLAD occluded, pD1 90, apical br of Dx occluded, CFX occluded, pOM1 90-95, RCA stents patent, diff RCA 30, S-Dx occluded, S-PDA occluded, S-OM1 40-50, L-LAD patent, EF 40% with inf HK.  PCI:  Promus (2.5 x 28) DES to mid to dist CFX.  Marland Kitchen Charcot's joint of knee   . COPD (chronic obstructive pulmonary disease) ([place])   . Deep venous thrombosis (HCC)    right lower extremity  . Diabetes mellitus    a. A1C 10.7 in 03/2013.  . Diastolic CHF ([place])    a. EF 40% by cath, 55-60% during 03/2013 adm, required IV diuresis.  . DVT, lower extremity, recurrent ([place])    a. Hx recurrent DVT per record.  . Dyslipidemia   . Elevated CK    a. Pt has refused rheum workup in the past.  . GERD (gastroesophageal reflux disease)   . History of epidermal inclusion cyst excision 07/09/2018  .  History of hiatal hernia   . HTN (hypertension)    x 15 years  . Hx of cardiovascular stress test    a. Lexiscan Myoview (03/2010):  diaph atten vs inf scar, no ischemia, EF 47%; Low Risk.  Marland Kitchen Hx of echocardiogram    a. Echo (04/08/13):  Mild LVH, EF 55-60%, restrictive physiology, severe LAE, mild reduced RVSF, mild RAE.  . Leg pain    ABI 6/16:  R 1.2, L 1.1 - normal  . Myocardial infarction ([place])   . Obesity   . Peripheral neuropathy   . Pulmonary embolism ([place])   . Sleep apnea     Patient Active Problem List   Diagnosis Date Noted  . COVID-19 02/12/2020  . Acute CHF (congestive heart failure) ([place]) 02/20/2019  . Chest pain 02/20/2019  . Coronary artery disease involving coronary bypass graft of native heart without angina pectoris 01/27/2019  . Chronic diastolic HF (heart failure) ([place]) 01/27/2019  . Educated about COVID-19 virus infection   . Atypical atrial flutter ([place])   . Cellulitis of right lower extremity   . SOB (shortness of breath) 12/06/2017  . Uncontrolled type 2 diabetes mellitus with insulin therapy ([place]) 12/06/2017  . Primary osteoarthritis of left knee 11/02/2017  . Open wound 06/05/2017  . Diabetic ulcer of left lower leg ([place]) 05/16/2017  . 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 Impaired mobility and activities of daily living 02/15/2017  . Atherosclerotic heart disease of native coronary artery without angina pectoris 01/05/2017  . Essential hypertension 12/19/2016  . Atherosclerosis of native arteries of the extremities with ulceration ([place]) 12/19/2016  . Peripheral vascular disease ([place]) 12/07/2016  . CHF (congestive heart failure) ([place]) 11/24/2016  . Demand ischemia ([place])   . 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 Stage 3 chronic kidney disease due to diabetes mellitus ([place]) 11/23/2016  . NSTEMI (non-ST elevated myocardial infarction) ([place]) 11/23/2016  . Acute diastolic CHF (congestive heart failure) ([place]) 11/23/2016   . Acute congestive heart failure ([place])   . Burn of left foot 11/21/2016  . Diabetic peripheral neuropathy ([place]) 11/21/2016  . Diabetic ulcer of right foot associated with type 2 diabetes mellitus, with fat layer exposed ([place]) 11/21/2016  . Diabetic peripheral vascular disease ([place]) 11/21/2016  . DDD (degenerative disc disease), lumbar 03/27/2016  . History of deep vein thrombosis (DVT) of lower extremity 03/26/2016  . History of pulmonary embolism 10/01/2015  . Nephropathy, diabetic ([place]) 10/01/2015  . 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 Chronic obstructive pulmonary disease, unspecified ([place]) 12/16/2013  . Medication management 04/14/2013  . History of recurrent DVT/E 04/11/2013  . Sleep apnea 04/11/2013  . Bradycardia 04/11/2013  . History of Elevated CK level  04/11/2013  . Class 1 obesity due to excess calories with serious comorbidity and body mass index (BMI) of 34.0 to 34.9 in adult 04/11/2013  . Peripheral neuropathy ([place])   . Charcot's arthropathy   . Acute on chronic HFrEF (heart failure with reduced ejection fraction) ([place]) 04/08/2013  . Paroxysmal atrial fibrillation ([place]) 04/07/2013  . Reactive depression (situational) 05/16/2011  . Long term (current) use of anticoagulants 09/21/2010  . 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 ED (erectile dysfunction) of organic origin 07/28/2010  . Hyperlipidemia 05/26/2008  . Hypertensive heart disease 05/26/2008  . Diabetes ([place]) 02/14/1991    Past Surgical History:  Procedure Laterality Date  . CARDIOVERSION N/A 06/19/2018   Procedure: CARDIOVERSION;  Surgeon: Acie Fredrickson Wonda Cheng, MD;  Location: [facility] ENDOSCOPY;  Service: Cardiovascular;  Laterality: N/A;  . CARDIOVERSION N/A 02/21/2019   Procedure: CARDIOVERSION;  Surgeon: Fay Records, MD;  Location: [facility];  Service: Cardiovascular;  Laterality: N/A;  . CORONARY ANGIOPLASTY WITH STENT PLACEMENT    . CORONARY ARTERY BYPASS GRAFT     4 time since 2002  . LEFT  HEART CATHETERIZATION WITH CORONARY/GRAFT ANGIOGRAM  04/05/2013   Procedure: LEFT HEART CATHETERIZATION WITH Beatrix Fetters;  Surgeon: Peter M Martinique, MD;  Location: [facility] CATH LAB;  Service: Cardiovascular;;  . left knee surgery    . LOWER EXTREMITY ANGIOGRAPHY Left 12/25/2016   Procedure: LOWER EXTREMITY ANGIOGRAPHY;  Surgeon: Algernon Huxley, MD;  Location: [facility] CV LAB;  Service: Cardiovascular;  Laterality: Left;  . PERCUTANEOUS CORONARY STENT INTERVENTION (PCI-S)  04/05/2013   Procedure: PERCUTANEOUS CORONARY STENT INTERVENTION (PCI-S);  Surgeon: Peter M Martinique, MD;  Location: [facility] CATH LAB;  Service: Cardiovascular;;  DES to native Mid cx  . VASCULAR SURGERY      Prior to Admission medications   Medication Sig Start Date End Date Taking? Authorizing Provider  acetaminophen (TYLENOL) 500 MG tablet Take 1,000 mg every 8 (eight) hours as needed by mouth for mild pain or headache.     [provider]  atorvastatin (LIPITOR) 20 MG tablet TAKE 1 TABLET BY MOUTH EVERY DAY Patient taking differently: Take 20 mg  by mouth daily. 05/12/19   Minus Breeding, MD  Blood Glucose Monitoring Suppl (FIFTY50 GLUCOSE METER 2.0) w/Device KIT Use as directed. 04/27/14   [provider]  Continuous Blood Gluc Sensor (FREESTYLE LIBRE 14 DAY SENSOR) MISC Inject 1 patch into the skin every 14 (fourteen) days.    [provider]  fluticasone (FLONASE) 50 MCG/ACT nasal spray Place 2 sprays into both nostrils daily as needed for allergies or rhinitis.  02/13/19   [provider]  gabapentin (NEURONTIN) 300 MG capsule Take 600 mg by mouth at bedtime as needed (for neuropathy).    [provider]  guaiFENesin-dextromethorphan (ROBITUSSIN DM) 100-10 MG/5ML syrup Take 10 mLs by mouth every 4 (four) hours as needed for cough. 02/16/20   Mosetta Anis, MD  insulin glargine, 1 Unit Dial, (TOUJEO SOLOSTAR) 300 UNIT/ML Solostar Pen Inject 80 Units into the skin daily before  breakfast.    [provider]  insulin lispro (HUMALOG) 100 UNIT/ML injection Inject 20-30 Units into the skin 3 (three) times daily before meals.    [provider]  Insulin Syringe-Needle U-100 31G X 5/16" 0.3 ML MISC use as directed 02/12/08   [provider]  isosorbide mononitrate (IMDUR) 30 MG 24 hr tablet TAKE 1 TABLET BY MOUTH EVERY DAY Patient taking differently: Take 30 mg by mouth daily. 04/03/19   Minus Breeding, MD  losartan (COZAAR) 25 MG tablet TAKE 1 TABLET BY MOUTH EVERY DAY Patient taking differently: Take 25 mg by mouth daily. 04/03/19   Minus Breeding, MD  metFORMIN (GLUCOPHAGE-XR) 500 MG 24 hr tablet Take 500 mg by mouth daily with breakfast.    [provider]  nitroGLYCERIN (NITROSTAT) 0.4 MG SL tablet Place 1 tablet (0.4 mg total) under the tongue every 5 (five) minutes as needed for chest pain. 12/01/19   Minus Breeding, MD  NON FORMULARY Take 25 mg by mouth See admin instructions. CBD oil 25 mg capsules- Take 25 mg (1 capsule) by mouth in the morning    [provider]  Omega-3 Fatty Acids (FISH OIL) 1200 MG CAPS Take 1,200 mg by mouth in the morning.    [provider]  pantoprazole (PROTONIX) 40 MG tablet TAKE 1 TABLET BY MOUTH EVERY DAY IN THE EVENING Patient taking differently: Take 40 mg by mouth at bedtime. 03/11/19   Minus Breeding, MD  potassium chloride (KLOR-CON) 20 MEQ tablet Take 1 tablet (20 mEq total) by mouth daily. Patient taking differently: Take 20-40 mEq by mouth See admin instructions. Take 20 mEq by mouth in the morning on Mon/Tues/Wed/Thurs/Fri and mEq on Sun/Sat 02/05/2020   Almyra Deforest, PA  torsemide (DEMADEX) 20 MG tablet Take 3 tablets (60 mg total) by mouth 2 (two) times daily. Take 40 mg BID PO M-F and Take 60 mg BID PO Saturdays and Sundays Patient taking differently: Take 60 mg by mouth 2 (two) times daily. 01/20/2020   Almyra Deforest, PA  warfarin (COUMADIN) 5 MG tablet Take 2 tablets (10 mg total) by  mouth daily at 4 PM. 02/16/20   Mosetta Anis, MD    Allergies Other and Simvastatin  Family History  Problem Relation Age of Onset  . Heart disease Mother   . Hypertension Mother   . Cancer Father   . Hypertension Sister   . Heart attack Neg Hx   . Stroke Neg Hx     Social History Social History   Tobacco Use  . Smoking status: Never Smoker  . Smokeless tobacco: Never  Used  Vaping Use  . Vaping Use: Never used  Substance Use Topics  . Alcohol use: Yes    Comment: rare  . Drug use: No    Review of Systems Constitutional: Positive for weakness. Eyes: No visual changes. ENT: No sore throat. Cardiovascular: Denies chest pain. Respiratory: Positive for shortness of breath. Gastrointestinal: No abdominal pain.  No nausea, no vomiting.  No diarrhea.   Genitourinary: Negative for dysuria. Musculoskeletal: Negative for back pain. Skin: Negative for rash. Neurological: Positive for loss of taste and smell. ____________________________________________   PHYSICAL EXAM:  VITAL SIGNS: ED Triage Vitals  Enc Vitals Group     BP 02/22/2020 1720 137/74     Pulse Rate 02/28/2020 1720 81     Resp 03/08/2020 1720 (!) 22     Temp 03/10/2020 1720 98.1 F (36.7 C)     Temp Source 03/03/2020 1720 Oral     SpO2 02/17/2020 1720 95 %     Weight 02/22/2020 1724 260 lb (117.9 kg)     Height 03/10/2020 1724 _0  (1.803 m)   Constitutional: Alert and oriented.  Eyes: Conjunctivae are normal.  ENT      Head: Normocephalic and atraumatic.      Nose: No congestion/rhinnorhea.      Mouth/Throat: Mucous membranes are moist.      Neck: No stridor. Hematological/Lymphatic/Immunilogical: No cervical lymphadenopathy. Cardiovascular: Normal rate, regular rhythm.  No murmurs, rubs, or gallops.  Respiratory: Increased respiratory rate and effort.  Gastrointestinal: Soft and non tender. No rebound. No guarding.  Genitourinary: Deferred Musculoskeletal: Left lower leg prosthesis. Mild right lower extremity  edema.  Neurologic:  Normal speech and language. No gross focal neurologic deficits are appreciated.  Skin:  Skin is warm, dry and intact. No rash noted. Psychiatric: Mood and affect are normal. Speech and behavior are normal. Patient exhibits appropriate insight and judgment.  ____________________________________________    LABS (pertinent positives/negatives)  Pending at time of admission. ____________________________________________   EKG  INance Pear, attending physician, personally viewed and interpreted this EKG  EKG Time: 1711 Rate: 82 Rhythm: atrial fibrillation Axis: normal Intervals: qtc 461 QRS: narrow ST changes: ST elevation II, III, aVF, ST depression I, aVL Impression: abnormal ekg  ____________________________________________    RADIOLOGY  None  ____________________________________________   PROCEDURES  Procedures  CRITICAL CARE Performed by: Nance Pear   Total critical care time: 30 minutes  Critical care time was exclusive of separately billable procedures and treating other patients.  Critical care was necessary to treat or prevent imminent or life-threatening deterioration.  Critical care was time spent personally by me on the following activities: development of treatment plan with patient and/or surrogate as well as nursing, discussions with consultants, evaluation of patient's response to treatment, examination of patient, obtaining history from patient or surrogate, ordering and performing treatments and interventions, ordering and review of laboratory studies, ordering and review of radiographic studies, pulse oximetry and re-evaluation of patient's condition.  ____________________________________________   INITIAL IMPRESSION / ASSESSMENT AND PLAN / ED COURSE  Pertinent labs & imaging results that were available during my care of the patient were reviewed by me and considered in my medical decision making (see chart for  details).   Patient presented to the emergency department today because of concerns for worsening shortness of breath.  Patient did recently have an admission for COVID.  Patient has extensive heart history.  EKG here in the emergency department was concerning for ST elevation MI.  He had ST elevation  in 2 3 and aVF with some depression in 1 and aVL.  Because of this a code STEMI was called.  Patient was given aspirin and heparin bolus here in the emergency department.  Patient was seen by Dr. Clayborn Bigness who took the patient emergently to the catheterization lab.  ____________________________________________   FINAL CLINICAL IMPRESSION(S) / ED DIAGNOSES  Final diagnoses:  Shortness of breath  ST elevation myocardial infarction (STEMI), unspecified artery [facility])     Note: This dictation was prepared with Dragon dictation. Any transcriptional errors that result from this process are unintentional     Nance Pear, MD 03/07/2020 1800

## 2020-02-18 NOTE — ED Triage Notes (Signed)
Pt presents to the [facility] via EMS from home with c/o difficulty breathing. EMS states that pt was diagnosed with COVID on 01/03 and developed shortness of breath today. EKG was performed en route which showed STEMI. Code STEMI activated upon arrival. Pt A&Ox4 and denies any chest pain at this time

## 2020-02-18 NOTE — CV Procedure (Signed)
Brief STEMI note Patient was brought into the Cath Lab for presumed STEMI ST elevation inferiorly reciprocal depressions laterally Known history of coronary bypass surgery x4 Recent Covid with hypoxemia shortness of breath Severe peripheral vascular disease with diabetes  Patient underwent emergent cardiac cath right groin approach Left ventriculogram was not performed because of renal insufficiency Coronaries Left main had minor irregularities LAD occluded proximally Circumflex occluded proximally RCA long 99% proximal totally occluded distally Grafts LIMA widely patent to mid LAD SVG to circumflex occluded CTO SVG to RCA occluded CTO SVG 100% to presume diagonal ,occluded recent IRA  Attempted PCI and stent of vein graft to presumed diagonal Unsuccessful PCI and stent to SVG No stent placed Right groin still with minx Placed on aspirin Plavix low-dose Levophed Went for to ICU Recommend echocardiogram for left ventricular function in the morning most recently echo showed EF of 45% a week ago Recommend aggressive medical therapy Recommend respiratory support for presumed Covid pneumonia with hypoxemia Case discussed with critical care attending Case discussed with his wife

## 2020-02-18 NOTE — ED Notes (Signed)
Pt left ED.

## 2020-02-18 NOTE — H&P (Signed)
NAME:  Bobby Lindsey, MRN:  [id number], DOB:  Jul 07, 1955, LOS: 0 ADMISSION DATE:  02/29/2020, CONSULTATION DATE: 02/18/2019 REFERRING MD: Dr. Clayborn Bigness, CHIEF COMPLAINT: Shortness of Breath   Brief History:  65 yo male recently diagnosed with COVID-19 pneumonia on 12/30 which he required hospitalization at [facility] 12/30 to 01/3 now admitted with STEMI and acute hypoxic respiratory failure secondary to COVID-19 pneumonia requiring Bipap now s/p cardiac catheterization with unsuccessful PCI and stent placement to SVG per cardiology recommending uninterrupted dual antiplatelet therapy    History of Present Illness:  This is a 65 yo male who presented to [facility] ER on 01/5 with worsening shortness of breath.  He was recently hospitalized at [facility] from 12/30 to 01/3 with COVID-19 pneumonia.  During current ER presentation ER EKG concerning for STEMI, therefore code STEMI initiated.  Pt received aspirin and heparin bolus.  On call Cardiologist evaluated pt and pt transported emergently to the cath lab.  Cardiac cath revealed multiple occlusions, however PCI and stent placement to the SVG unsuccessful per cardiology recommending uninterrupted dual antiplatelet therapy for a minimum of 6 months.  Pt admitted to ICU post procedure due to Bipap requirement secondary to acute hypoxic respiratory failure in the setting of COVID-19 pneumonia.    Past Medical History:  OSA Pulmonary Embolism  Peripheral Neuropathy Obesity MI HTN GERD Hiatal Hernia  Dyslipidemia  Chronic Diastolic CHF  DVT COPD Charcot's Joint of Knee CAD Bradycardia  Atrial Fibrillation  Anginal Pain   Significant Hospital Events:  01/5: Pt admitted to ICU post cardiac cath   Consults:  Intensivist   Procedures:  Cardiac Cath 01/5:   Significant Diagnostic Tests:  Cardiac Cath 01/5:   Micro Data:  Blood x 2>>  Antimicrobials:  Cefepime 01/5>>  Interim History / Subjective:  Pt somnolent post cardiac  cath currently requiring Bipap   Objective   Blood pressure 109/77, pulse 72, temperature 98.1 F (36.7 C), temperature source Oral, resp. rate (!) 27, height 5' 11" (1.803 m), weight 117.9 kg, SpO2 93 %.       No intake or output data in the 24 hours ending 03/13/2020 1917 Filed Weights   02/23/2020 1724  Weight: 117.9 kg    Examination: General: acutely ill appearing male, slightly labored on Bipap  HENT: supple, no JVD  Lungs: rhonchi throughout, slightly labored and tachypneic  Cardiovascular: irregular irregular, no R/G, 2+ radial pulses, 1+ distal pulses  Abdomen: +BS x4, obese, soft, non tender, non distended  Extremities: left BKA Neuro: somnolent, attempting to follow commands, PERRL  Assessment & Plan:   Acute hypoxic respiratory failure secondary to COVID-19 pneumonia  Hx: COPD, OSA, and Obesity  Prn Bipap or supplemental O2 for dyspnea and/or hypoxia  Scheduled iv steroids taper as tolerated Prn bronchodilator therapy Trend inflammatory markers  Proning as tolerated  Continue vitamins Continue antitussives  Trend WBC and monitor fever curve  Trend PCT  Follow cultures  Will start cefepime for HCAP coverage  Aggressive pulmonary hygiene  STEMI s/p cardiac catheterization 02/18/20, however unsuccessful PCI and stent placement to SVG Elevated troponin's Multivessel coronary disease  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%) Hx: Vascular Disease, HLD, HTN, MI, Dyslipidemia, Atrial Fibrillation, Anginal Pain, Bradycardia, CAD, Pulmonary Embolism, DVT, and CABG x4  Continuous telemetry monitoring  Cardiology consulted appreciate input  Continue cardiac medications per Cardiology recommendations Trend troponin's   Acute on chronic renal failure  Hyponatremia  Trend BMP  Replace electrolytes as indicate Monitor UOP Avoid nephrotoxic medications  If renal function worsens will consult Nephrology   Peripheral neuropathy  Continue outpatient gabapentin   GERD  IV protonix    Type II diabetes mellitus  CBG q4hrs  SSI and scheduled lantus   Acute encephalopathy secondary to sedating medications Frequent reorientation    Best practice (evaluated daily)  Diet: NPO for now while on Bipap  Pain/Anxiety/Delirium protocol (if indicated): not indicated VAP protocol (if indicated): not indicated  DVT prophylaxis: SCD's  GI prophylaxis: IV protonix  Glucose control: SSI Mobility: Bedrest  Disposition: ICU  Goals of Care:  Last date of multidisciplinary goals of care discussion: N/A Family and staff present: N/A Summary of discussion: Pts wife updated by Cardiology  Follow up goals of care discussion due: 02/19/2020 Code Status: Full Code   Labs   CBC: Recent Labs  Lab 02/13/20 0358 02/14/20 0555 02/15/20 0310 02/16/20 0950 03/14/2020 1713  WBC 4.5 6.0 4.8 7.0 6.8  NEUTROABS 2.3 3.7 3.3 4.7 5.1  HGB 15.0 14.7 14.5 16.9 16.3  HCT 44.5 43.5 45.1 51.6 49.3  MCV 85.6 85.8 86.2 84.9 84.7  PLT 123* 126* 98* 118* 117*    Basic Metabolic Panel: Recent Labs  Lab 02/13/20 0358 02/14/20 0555 02/15/20 0310 02/16/20 0950 03/02/2020 1713  NA 136 134* 135 135 129*  K 4.3 4.5 4.6 4.4 4.6  CL 101 100 101 97* 89*  CO2 _0 GLUCOSE 202* 259* 208* 108* 277*  BUN 34* 40* 46* 47* 93*  CREATININE 1.23 1.34* 1.38* 1.43* 2.61*  CALCIUM 9.0 8.8* 8.7* 9.1 8.4*   GFR: Estimated Creatinine Clearance: 36.8 mL/min (A) (by C-G formula based on SCr of 2.61 mg/dL (H)). Recent Labs  Lab 02/12/20 0815 02/13/20 0358 02/14/20 0555 02/15/20 0310 02/16/20 0950 03/14/2020 1713  PROCALCITON <0.10  --   --   --   --   --   WBC  --    < > 6.0 4.8 7.0 6.8  LATICACIDVEN 1.1  --   --   --   --   --    < > = values in this interval not displayed.    Liver Function Tests: Recent Labs  Lab 02/12/20 0815 02/13/20 0358 02/14/20 0555 02/15/20 0310 02/16/20 0950  AST _1 36 39  ALT _2 35  ALKPHOS 52 58 53 53 63  BILITOT 1.1 0.8 0.6 1.2 1.0   PROT 6.6 6.8 6.6 6.3* 7.5  ALBUMIN 3.3* 3.3* 3.1* 3.0* 3.5   No results for input(s): LIPASE, AMYLASE in the last 168 hours. No results for input(s): AMMONIA in the last 168 hours.  ABG    Component Value Date/Time   TCO2 29 11/15/2015 1647     Coagulation Profile: Recent Labs  Lab 02/13/20 0358 02/14/20 0555 02/15/20 0310 02/16/20 0950 02/28/2020 1713  INR 1.6* 1.7* 1.6* 1.4* 2.2*    Cardiac Enzymes: No results for input(s): CKTOTAL, CKMB, CKMBINDEX, TROPONINI in the last 168 hours.  HbA1C: Hgb A1c MFr Bld  Date/Time Value Ref Range Status  02/13/2020 03:58 AM 9.5 (H) 4.8 - 5.6 % Final    Comment:    (NOTE) Pre diabetes:          5.7%-6.4%  Diabetes:              >6.4%  Glycemic control for   <7.0% adults with diabetes   02/20/2019 08:48 AM 9.6 (H) 4.8 - 5.6 % Final    Comment:    (NOTE) Pre  diabetes:          5.7%-6.4% Diabetes:              >6.4% Glycemic control for   <7.0% adults with diabetes     CBG: Recent Labs  Lab 02/15/20 1545 02/15/20 2158 02/16/20 0637 02/16/20 1158 02/16/20 1346  GLUCAP 218* 242* 122* 78 69*    Review of Systems:   Unable to assess pt somnolent   Past Medical History:  He,  has a past medical history of Anginal pain ([place]), Atrial fibrillation ([place]), Bradycardia, CAD (coronary artery disease), Charcot's joint of knee, COPD (chronic obstructive pulmonary disease) ([place]), Deep venous thrombosis ([place]), Diabetes mellitus, Diastolic CHF ([place]), DVT, lower extremity, recurrent ([place]), Dyslipidemia, Elevated CK, GERD (gastroesophageal reflux disease), History of epidermal inclusion cyst excision (07/09/2018), History of hiatal hernia, HTN (hypertension), cardiovascular stress test, echocardiogram, Leg pain, Myocardial infarction ([place]), Obesity, Peripheral neuropathy, Pulmonary embolism ([place]), and Sleep apnea.   Surgical History:   Past Surgical History:  Procedure Laterality Date  . CARDIOVERSION N/A 06/19/2018   Procedure:  CARDIOVERSION;  Surgeon: Acie Fredrickson Wonda Cheng, MD;  Location: [facility] ENDOSCOPY;  Service: Cardiovascular;  Laterality: N/A;  . CARDIOVERSION N/A 02/21/2019   Procedure: CARDIOVERSION;  Surgeon: Fay Records, MD;  Location: [facility];  Service: Cardiovascular;  Laterality: N/A;  . CORONARY ANGIOPLASTY WITH STENT PLACEMENT    . CORONARY ARTERY BYPASS GRAFT     4 time since 2002  . LEFT HEART CATHETERIZATION WITH CORONARY/GRAFT ANGIOGRAM  04/05/2013   Procedure: LEFT HEART CATHETERIZATION WITH Beatrix Fetters;  Surgeon: Peter M Martinique, MD;  Location: [facility] CATH LAB;  Service: Cardiovascular;;  . left knee surgery    . LOWER EXTREMITY ANGIOGRAPHY Left 12/25/2016   Procedure: LOWER EXTREMITY ANGIOGRAPHY;  Surgeon: Algernon Huxley, MD;  Location: [place] CV LAB;  Service: Cardiovascular;  Laterality: Left;  . PERCUTANEOUS CORONARY STENT INTERVENTION (PCI-S)  04/05/2013   Procedure: PERCUTANEOUS CORONARY STENT INTERVENTION (PCI-S);  Surgeon: Peter M Martinique, MD;  Location[facility] CATH LAB;  Service: Cardiovascular;;  DES to native Mid cx  . VASCULAR SURGERY       Social History:   reports that he has never smoked. He has never used smokeless tobacco. He reports current alcohol use. He reports that he does not use drugs.   Family History:  His family history includes Cancer in his father; Heart disease in his mother; Hypertension in his mother and sister. There is no history of Heart attack or Stroke.   Allergies Allergies  Allergen Reactions  . Other Other (See Comments)    Steroids- "BGL goes SKY-HIGH"  . Simvastatin Diarrhea and Other (See Comments)    Fatigue, also       Home Medications  Prior to Admission medications   Medication Sig Start Date End Date Taking? Authorizing Provider  acetaminophen (TYLENOL) 500 MG tablet Take 1,000 mg every 8 (eight) hours as needed by mouth for mild pain or headache.     [provider]  atorvastatin (LIPITOR) 20 MG tablet TAKE 1 TABLET BY  MOUTH EVERY DAY Patient taking differently: Take 20 mg by mouth daily. 05/12/19   Minus Breeding, MD  Blood Glucose Monitoring Suppl (FIFTY50 GLUCOSE METER 2.0) w/Device KIT Use as directed. 04/27/14   [provider]  Continuous Blood Gluc Sensor (FREESTYLE LIBRE 14 DAY SENSOR) MISC Inject 1 patch into the skin every 14 (fourteen) days.    [provider]  fluticasone (FLONASE) 50 MCG/ACT nasal spray Place 2 sprays  into both nostrils daily as needed for allergies or rhinitis.  02/13/19   [provider]  gabapentin (NEURONTIN) 300 MG capsule Take 600 mg by mouth at bedtime as needed (for neuropathy).    [provider]  guaiFENesin-dextromethorphan (ROBITUSSIN DM) 100-10 MG/5ML syrup Take 10 mLs by mouth every 4 (four) hours as needed for cough. 02/16/20   Mosetta Anis, MD  insulin glargine, 1 Unit Dial, (TOUJEO SOLOSTAR) 300 UNIT/ML Solostar Pen Inject 80 Units into the skin daily before breakfast.    [provider]  insulin lispro (HUMALOG) 100 UNIT/ML injection Inject 20-30 Units into the skin 3 (three) times daily before meals.    [provider]  Insulin Syringe-Needle U-100 31G X 5/16" 0.3 ML MISC use as directed 02/12/08   [provider]  isosorbide mononitrate (IMDUR) 30 MG 24 hr tablet TAKE 1 TABLET BY MOUTH EVERY DAY Patient taking differently: Take 30 mg by mouth daily. 04/03/19   Minus Breeding, MD  losartan (COZAAR) 25 MG tablet TAKE 1 TABLET BY MOUTH EVERY DAY Patient taking differently: Take 25 mg by mouth daily. 04/03/19   Minus Breeding, MD  metFORMIN (GLUCOPHAGE-XR) 500 MG 24 hr tablet Take 500 mg by mouth daily with breakfast.    [provider]  nitroGLYCERIN (NITROSTAT) 0.4 MG SL tablet Place 1 tablet (0.4 mg total) under the tongue every 5 (five) minutes as needed for chest pain. 12/01/19   Minus Breeding, MD  NON FORMULARY Take 25 mg by mouth See admin instructions. CBD oil 25 mg capsules- Take 25 mg (1  capsule) by mouth in the morning    [provider]  Omega-3 Fatty Acids (FISH OIL) 1200 MG CAPS Take 1,200 mg by mouth in the morning.    [provider]  pantoprazole (PROTONIX) 40 MG tablet TAKE 1 TABLET BY MOUTH EVERY DAY IN THE EVENING Patient taking differently: Take 40 mg by mouth at bedtime. 03/11/19   Minus Breeding, MD  potassium chloride (KLOR-CON) 20 MEQ tablet Take 1 tablet (20 mEq total) by mouth daily. Patient taking differently: Take 20-40 mEq by mouth See admin instructions. Take 20 mEq by mouth in the morning on Mon/Tues/Wed/Thurs/Fri and mEq on Sun/Sat 01/28/2020   Almyra Deforest, PA  torsemide (DEMADEX) 20 MG tablet Take 3 tablets (60 mg total) by mouth 2 (two) times daily. Take 40 mg BID PO M-F and Take 60 mg BID PO Saturdays and Sundays Patient taking differently: Take 60 mg by mouth 2 (two) times daily. 01/30/2020   Almyra Deforest, PA  warfarin (COUMADIN) 5 MG tablet Take 2 tablets (10 mg total) by mouth daily at 4 PM. 02/16/20   Mosetta Anis, MD     Critical care time: 43 minutes     Marda Stalker, [place] Pager [phone] (please enter 7 digits) PCCM Consult Pager [phone] (please enter 7 digits)

## 2020-02-18 NOTE — Telephone Encounter (Signed)
Patient's wife is requesting to speak with Dr. Rosezella [place] nurse. She states the patient was recently discharged from the hospital with COVID and a bad cough. She states she would like to have the patient seen by Dr. Percival Spanish this week.

## 2020-02-18 NOTE — Telephone Encounter (Signed)
Spoke with patient's wife - patient was hospitalized 12/29 - 1/3 - acute respiratory, heart failure He tested positive for COVID-19 on Dec 29 at home and at hospital, per wife His temp this morning was 99.7 He has a real bad cough, poor appetite  He was supposed to be on a steroid once discharged, but his blood sugar went up in hospital so this was stopped He was not given antibiotic, PRN inahler/neb; was only given robutussin   Advised wife to contact PCP about his low-grade fever, appetite, cough Will check on office policy about when we can schedule an in-person visit after COVID diagnosis, as wife states he would not be able to do a virtual visit  Routed to Dr. Percival Spanish

## 2020-02-18 NOTE — ED Notes (Signed)
PT WIFE: [phone]

## 2020-02-18 NOTE — Consult Note (Signed)
ANTICOAGULATION CONSULT NOTE - Initial Consult  Pharmacy Consult for heparin infusion Indication: chest pain/ACS  Allergies  Allergen Reactions  . Other Other (See Comments)    Steroids- "BGL goes SKY-HIGH"  . Simvastatin Diarrhea and Other (See Comments)    Fatigue, also      Patient Measurements: Height: 5\' 11"  (180.3 cm) Weight: 117.9 kg (260 lb) IBW/kg (Calculated) : 75.3 Heparin Dosing Weight: 101.3 kg  Vital Signs: Temp: 98.1 F (36.7 C) (01/05 1720) Temp Source: Oral (01/05 1720) BP: 109/77 (01/05 1736) Pulse Rate: 72 (01/05 1736)  Labs: Recent Labs    02/16/20 0950 03/12/2020 1713  HGB 16.9  --   HCT 51.6  --   PLT 118*  --   APTT  --  45*  LABPROT 16.2* 24.0*  INR 1.4* 2.2*  CREATININE 1.43* 2.61*  TROPONINIHS  --  4,704*    Estimated Creatinine Clearance: 36.8 mL/min (A) (by C-G formula based on SCr of 2.61 mg/dL (H)).   Medical History: Past Medical History:  Diagnosis Date  . Anginal pain ([place])    last pm  . Atrial fibrillation ([place])    a. Transient during 03/2013 admission.  . Bradycardia    a. Bradycardia/pauses/possible Mobitz II during 03/2013 adm. Not on BB due to this.  Marland Kitchen CAD (coronary artery disease)    a. s/p CABG 2002. b. Hx Cypher stent to the RCA. c. Inf-lat STEMI 03/2013:  LHC (04/05/13):  mLAD occluded, pD1 90, apical br of Dx occluded, CFX occluded, pOM1 90-95, RCA stents patent, diff RCA 30, S-Dx occluded, S-PDA occluded, S-OM1 40-50, L-LAD patent, EF 40% with inf HK.  PCI:  Promus (2.5 x 28) DES to mid to dist CFX.  Marland Kitchen Charcot's joint of knee   . COPD (chronic obstructive pulmonary disease) ([place])   . Deep venous thrombosis ([facility])    right lower extremity  . Diabetes mellitus    a. A1C 10.7 in 03/2013.  . Diastolic CHF ([place])    a. EF 40% by cath, 55-60% during 03/2013 adm, required IV diuresis.  . DVT, lower extremity, recurrent ([place])    a. Hx recurrent DVT per record.  . Dyslipidemia   . Elevated CK    a. Pt has refused rheum  workup in the past.  . GERD (gastroesophageal reflux disease)   . History of epidermal inclusion cyst excision 07/09/2018  . History of hiatal hernia   . HTN (hypertension)    x 15 years  . Hx of cardiovascular stress test    a. Lexiscan Myoview (03/2010):  diaph atten vs inf scar, no ischemia, EF 47%; Low Risk.  Marland Kitchen Hx of echocardiogram    a. Echo (04/08/13):  Mild LVH, EF 55-60%, restrictive physiology, severe LAE, mild reduced RVSF, mild RAE.  . Leg pain    ABI 6/16:  R 1.2, L 1.1 - normal  . Myocardial infarction ([place])   . Obesity   . Peripheral neuropathy   . Pulmonary embolism ([place])   . Sleep apnea     Medications:  PTA Warfarin: 10 mg PO daily (70 mg total weekly dose) 01/05 1730: Heparin 4,000 unit IV bolus x 1 given 01/05 1829: Bivalirudin bolus + infusion >> ending 1927  Assessment: Patient is a 67 yom that is being admitted for STEMI. Patient is on warfarin at home for history of a PE, DVT, and afib. Pharmacy has been consulted for heparin infusion for ACS. INR 2.2 on admission. Assumed last dose of warfarin 10 mg  01/04  evening    Goal of Therapy:  Heparin level 0.3-0.7 units/ml Monitor platelets by anticoagulation protocol: Yes   Plan:   Patient already received heparin 4,000 unit IV bolus in ED  Given acute MI while anticoagulated on warfarin therapy will initiate heparin infusion @ 1400 units/hr now even though INR > 2.0  Check heparin level in 6 hours and INR with AM labs  Monitor CBC daily   Dorothe Pea, PharmD, BCPS Clinical Pharmacist  03/11/2020,6:38 PM

## 2020-02-18 NOTE — Consult Note (Signed)
CARDIOLOGY CONSULT NOTE               Patient ID: Bobby Lindsey MRN: [id number] DOB/AGE: 1955/03/11 65 y.o.  Admit date: 02/24/2020 Referring Physician Archie Balboa emergency room Primary Physician Kerrin Mo primary Primary Cardiologist [facility] Reason for Consultation STEMI  HPI: Patient is 65 year old white male multiple medical problems multivessel coronary artery disease.  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  Patient recently diagnosed with Covid pneumonia and was admitted and [facility] discharged with mild hypoxemia on 2 L.  Developed worsening hypoxemia with ST segment changes according to the wife never really complained of significant chest pain but EKG met STEMI criteria so cardiology and STEMI team was mobilized.  During my interview the patient states he was pain-free from his chest he had some shoulder pain on the right which has been chronic and recurrent.  He was still dyspneic short of breath on nonrebreather.  Denies any fever chills or sweats no sputum production.  The patient was being admitted via STEMI to bring to the cardiac Cath Lab with intention to treat possibly with PCI and stent  Review of systems complete and found to be negative unless listed above     Past Medical History:  Diagnosis Date  . Anginal pain ([place])    last pm  . Atrial fibrillation ([place])    a. Transient during 03/2013 admission.  . Bradycardia    a. Bradycardia/pauses/possible Mobitz II during 03/2013 adm. Not on BB due to this.  Marland Kitchen CAD (coronary artery disease)    a. s/p CABG 2002. b. Hx Cypher stent to the RCA. c. Inf-lat STEMI 03/2013:  LHC (04/05/13):  mLAD occluded, pD1 90, apical br of Dx occluded, CFX occluded, pOM1 90-95, RCA stents patent,  diff RCA 30, S-Dx occluded, S-PDA occluded, S-OM1 40-50, L-LAD patent, EF 40% with inf HK.  PCI:  Promus (2.5 x 28) DES to mid to dist CFX.  Marland Kitchen Charcot's joint of knee   . COPD (chronic obstructive pulmonary disease) ([place])   . Deep venous thrombosis (HCC)    right lower extremity  . Diabetes mellitus    a. A1C 10.7 in 03/2013.  . Diastolic CHF ([place])    a. EF 40% by cath, 55-60% during 03/2013 adm, required IV diuresis.  . DVT, lower extremity, recurrent ([place])    a. Hx recurrent DVT per record.  . Dyslipidemia   . Elevated CK    a. Pt has refused rheum workup in the past.  . GERD (gastroesophageal reflux disease)   . History of epidermal inclusion cyst excision 07/09/2018  . History of hiatal hernia   . HTN (hypertension)    x 15 years  . Hx of cardiovascular stress test    a. Lexiscan Myoview (03/2010):  diaph atten vs inf scar, no ischemia, EF 47%; Low Risk.  Marland Kitchen Hx of echocardiogram    a. Echo (04/08/13):  Mild LVH, EF 55-60%, restrictive physiology, severe LAE, mild reduced RVSF, mild RAE.  . Leg pain    ABI 6/16:  R 1.2, L 1.1 - normal  . Myocardial infarction ([place])   . Obesity   . Peripheral neuropathy   . Pulmonary embolism ([place])   . Sleep apnea     Past Surgical History:  Procedure Laterality Date  . CARDIOVERSION N/A  06/19/2018   Procedure: CARDIOVERSION;  Surgeon: Acie Fredrickson Wonda Cheng, MD;  Location: [facility] ENDOSCOPY;  Service: Cardiovascular;  Laterality: N/A;  . CARDIOVERSION N/A 02/21/2019   Procedure: CARDIOVERSION;  Surgeon: Fay Records, MD;  Location: [place];  Service: Cardiovascular;  Laterality: N/A;  . CORONARY ANGIOPLASTY WITH STENT PLACEMENT    . CORONARY ARTERY BYPASS GRAFT     4 time since 2002  . LEFT HEART CATHETERIZATION WITH CORONARY/GRAFT ANGIOGRAM  04/05/2013   Procedure: LEFT HEART CATHETERIZATION WITH Beatrix Fetters;  Surgeon: Peter M Martinique, MD;  Location: [facility] CATH LAB;  Service: Cardiovascular;;  . left knee surgery    . LOWER EXTREMITY  ANGIOGRAPHY Left 12/25/2016   Procedure: LOWER EXTREMITY ANGIOGRAPHY;  Surgeon: Algernon Huxley, MD;  Location: [place] CV LAB;  Service: Cardiovascular;  Laterality: Left;  . PERCUTANEOUS CORONARY STENT INTERVENTION (PCI-S)  04/05/2013   Procedure: PERCUTANEOUS CORONARY STENT INTERVENTION (PCI-S);  Surgeon: Peter M Martinique, MD;  Location: [facility] CATH LAB;  Service: Cardiovascular;;  DES to native Mid cx  . VASCULAR SURGERY      Medications Prior to Admission  Medication Sig Dispense Refill Last Dose  . acetaminophen (TYLENOL) 500 MG tablet Take 1,000 mg every 8 (eight) hours as needed by mouth for mild pain or headache.      Marland Kitchen atorvastatin (LIPITOR) 20 MG tablet TAKE 1 TABLET BY MOUTH EVERY DAY (Patient taking differently: Take 20 mg by mouth daily.) 90 tablet 2   . Blood Glucose Monitoring Suppl (FIFTY50 GLUCOSE METER 2.0) w/Device KIT Use as directed.     . Continuous Blood Gluc Sensor (FREESTYLE LIBRE 14 DAY SENSOR) MISC Inject 1 patch into the skin every 14 (fourteen) days.     . fluticasone (FLONASE) 50 MCG/ACT nasal spray Place 2 sprays into both nostrils daily as needed for allergies or rhinitis.      Marland Kitchen gabapentin (NEURONTIN) 300 MG capsule Take 600 mg by mouth at bedtime as needed (for neuropathy).     Marland Kitchen guaiFENesin-dextromethorphan (ROBITUSSIN DM) 100-10 MG/5ML syrup Take 10 mLs by mouth every 4 (four) hours as needed for cough. 118 mL 0   . insulin glargine, 1 Unit Dial, (TOUJEO SOLOSTAR) 300 UNIT/ML Solostar Pen Inject 80 Units into the skin daily before breakfast.     . insulin lispro (HUMALOG) 100 UNIT/ML injection Inject 20-30 Units into the skin 3 (three) times daily before meals.     . Insulin Syringe-Needle U-100 31G X 5/16" 0.3 ML MISC use as directed     . isosorbide mononitrate (IMDUR) 30 MG 24 hr tablet TAKE 1 TABLET BY MOUTH EVERY DAY (Patient taking differently: Take 30 mg by mouth daily.) 90 tablet 3   . losartan (COZAAR) 25 MG tablet TAKE 1 TABLET BY MOUTH EVERY DAY (Patient  taking differently: Take 25 mg by mouth daily.) 90 tablet 3   . metFORMIN (GLUCOPHAGE-XR) 500 MG 24 hr tablet Take 500 mg by mouth daily with breakfast.     . nitroGLYCERIN (NITROSTAT) 0.4 MG SL tablet Place 1 tablet (0.4 mg total) under the tongue every 5 (five) minutes as needed for chest pain. 25 tablet 2   . NON FORMULARY Take 25 mg by mouth See admin instructions. CBD oil 25 mg capsules- Take 25 mg (1 capsule) by mouth in the morning     . Omega-3 Fatty Acids (FISH OIL) 1200 MG CAPS Take 1,200 mg by mouth in the morning.     . pantoprazole (PROTONIX) 40 MG tablet TAKE 1 TABLET  BY MOUTH EVERY DAY IN THE EVENING (Patient taking differently: Take 40 mg by mouth at bedtime.) 90 tablet 3   . potassium chloride (KLOR-CON) 20 MEQ tablet Take 1 tablet (20 mEq total) by mouth daily. (Patient taking differently: Take 20-40 mEq by mouth See admin instructions. Take 20 mEq by mouth in the morning on Mon/Tues/Wed/Thurs/Fri and mEq on Sun/Sat) 90 tablet 1   . torsemide (DEMADEX) 20 MG tablet Take 3 tablets (60 mg total) by mouth 2 (two) times daily. Take 40 mg BID PO M-F and Take 60 mg BID PO Saturdays and Sundays (Patient taking differently: Take 60 mg by mouth 2 (two) times daily.) 360 tablet 3   . warfarin (COUMADIN) 5 MG tablet Take 2 tablets (10 mg total) by mouth daily at 4 PM. 60 tablet 1    Social History   Socioeconomic History  . Marital status: Married    Spouse name: Not on file  . Number of children: Not on file  . Years of education: Not on file  . Highest education level: Not on file  Occupational History  . Occupation: CONSTRUCTION    Employer: [employer]    Comment: Clinical biochemist  Tobacco Use  . Smoking status: Never Smoker  . Smokeless tobacco: Never Used  Vaping Use  . Vaping Use: Never used  Substance and Sexual Activity  . Alcohol use: Yes    Comment: rare  . Drug use: No  . Sexual activity: Yes    Birth control/protection: None  Other Topics  Concern  . 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    Social Determinants of Health   Financial Resource Strain: Not on file  Food Insecurity: Not on file  Transportation Needs: Not on file  Physical Activity: Not on file  Stress: Not on file  Social Connections: Not on file  Intimate Partner Violence: Not on file    Family History  Problem Relation Age of Onset  . Heart disease Mother   . Hypertension Mother   . Cancer Father   . Hypertension Sister   . Heart attack Neg Hx   . Stroke Neg Hx       Review of systems complete and found to be negative unless listed above      PHYSICAL EXAM  General: Well developed, well nourished, in respiratory acute distress HEENT:  Normocephalic and atramatic Neck:  No JVD.  Lungs: Clear bilaterally to auscultation and percussion. Heart: HRRR . Normal S1 and S2 without gallops or murmurs.  Abdomen: Bowel sounds are positive, abdomen soft and non-tender  Msk:  Back normal, normal gait. Normal strength and tone for age. Extremities: No clubbing, cyanosis or edema.  BKA on the left Neuro: Alert and oriented X 3. Psych:  Good affect, responds appropriately  Labs:   Lab Results  Component Value Date   WBC 6.8 02/14/2020   HGB 16.3 03/05/2020   HCT 49.3 02/15/2020   MCV 84.7 03/13/2020   PLT 117 (L) 02/28/2020    Recent Labs  Lab 02/16/20 0950 02/22/2020 1713  NA 135 129*  K 4.4 4.6  CL 97* 89*  CO2 26 27  BUN 47* 93*  CREATININE 1.43* 2.61*  CALCIUM 9.1 8.4*  PROT 7.5  --   BILITOT 1.0  --   ALKPHOS 63  --   ALT 35  --   AST 39  --   GLUCOSE 108* 277*   Lab Results  Component Value Date   CKTOTAL 233 (H) 12/08/2011   CKMB 5.5 (H) 08/17/2010   CKMBINDEX 7.2 03/31/2015   TROPONINI <0.03 06/17/2018    Lab Results  Component Value Date   CHOL 93 03/03/2020   CHOL 123 04/18/2018   CHOL 125 08/15/2016   Lab Results  Component Value Date   HDL 31 (L) 02/15/2020    HDL 37 (L) 04/18/2018   HDL 31 (L) 08/15/2016   Lab Results  Component Value Date   LDLCALC 41 02/28/2020   LDLCALC 63 04/18/2018   LDLCALC 21 08/15/2016   Lab Results  Component Value Date   TRIG 106 02/21/2020   TRIG 81 02/12/2020   TRIG 117 04/18/2018   Lab Results  Component Value Date   CHOLHDL 3.0 03/05/2020   CHOLHDL 3.3 04/18/2018   CHOLHDL 4.0 08/15/2016   No results found for: LDLDIRECT    Radiology: DG Chest 2 View  Result Date: 02/11/2020 CLINICAL DATA:  Shortness of breath and chest tightness for 1 day. EXAM: CHEST - 2 VIEW COMPARISON:  02/19/2019, CT 06/17/2018 FINDINGS: Post median sternotomy and CABG. Chronic cardiomegaly. There is peribronchial thickening with mild interstitial accentuation. Chronic left basilar pleural thickening with probable prior to talk pleurodesis. Mild left basilar scarring. No pneumothorax. No acute osseous abnormalities are seen. IMPRESSION: 1. Chronic cardiomegaly. 2. Peribronchial thickening with mild interstitial accentuation, suspicious for pulmonary edema. 3. Left basilar scarring. Electronically Signed   By: Keith Rake M.D.   On: 02/11/2020 18:34   ECHOCARDIOGRAM COMPLETE  Result Date: 02/13/2020    ECHOCARDIOGRAM REPORT   Patient Name:   Bobby Lindsey Date of Exam: 02/13/2020 Medical Rec #:  [id number]       Height:       72.0 in Accession #:    [id number]      Weight:       266.0 lb Date of Birth:  1955-10-18      BSA:          2.405 m Patient Age:    72 years        BP:           120/103 mmHg Patient Gender: M               HR:           50 bpm. Exam Location:  Inpatient Procedure: 2D Echo, Color Doppler, Cardiac Doppler and Intracardiac            Opacification Agent Indications:    Dyspnea R06.00  History:        Patient has prior history of Echocardiogram examinations, most                 recent 02/20/2019. CHF, CAD and Previous Myocardial Infarction,                 COPD, Arrythmias:Atrial Fibrillation,                  Signs/Symptoms:Bradycardia; Risk Factors:Hypertension, Diabetes,                 Dyslipidemia and Sleep Apnea. COVID 19. Chronic kidney disease.                 History of pulmonary embolism.  Sonographer:    Darlina Sicilian RDCS Referring Phys: [id number] [facility]  Sonographer Comments: Image acquisition challenging due to uncooperative patient. IMPRESSIONS  1. Images are very dfficult to evaluate even with Definity contrast. There appears  to be lateral wall hypokinesis, mostly matching the typical distribution of the left circunflex coronary artery. Left ventricular ejection fraction, by estimation, is 45 to 50%. The left ventricle has mildly decreased function. The left ventricle has no regional wall motion abnormalities. The left ventricular internal cavity size was mildly dilated. There is mild eccentric left ventricular hypertrophy. Left ventricular diastolic parameters are consistent with Grade II diastolic dysfunction (pseudonormalization). Elevated left atrial pressure. There is the interventricular septum is flattened in systole, consistent with right ventricular pressure overload.  2. Right ventricular systolic function is moderately reduced. The right ventricular size is mildly enlarged. There is severely elevated pulmonary artery systolic pressure.  3. Left atrial size was severely dilated.  4. The mitral valve is normal in structure. No evidence of mitral valve regurgitation. No evidence of mitral stenosis.  5. Tricuspid valve regurgitation is mild to moderate.  6. The aortic valve is tricuspid. Aortic valve regurgitation is not visualized. Mild aortic valve sclerosis is present, with no evidence of aortic valve stenosis.  7. The inferior vena cava is normal in size with greater than 50% respiratory variability, suggesting right atrial pressure of 3 mmHg. Comparison(s): Prior images reviewed side by side. The left ventricular function is worsened. The left ventricular wall motion abnormality  is worse. However, the confidence of the assessment is lowered by image quality. Consider repeat evaluation when the patient is able to cooperate. FINDINGS  Left Ventricle: Images are very dfficult to evaluate even with Definity contrast. There appears to be lateral wall hypokinesis, mostly matching the typical distribution of the left circunflex coronary artery. Left ventricular ejection fraction, by estimation, is 45 to 50%. The left ventricle has mildly decreased function. The left ventricle has no regional wall motion abnormalities. Definity contrast agent was given IV to delineate the left ventricular endocardial borders. The left ventricular internal cavity size was mildly dilated. There is mild eccentric left ventricular hypertrophy. The interventricular septum is flattened in systole, consistent with right ventricular pressure overload. Left ventricular diastolic parameters are consistent with Grade II diastolic dysfunction (pseudonormalization). Elevated left atrial pressure.  LV Wall Scoring: The antero-lateral wall, posterior wall, and basal inferior segment are hypokinetic. The entire anterior wall, entire septum, entire apex, and mid and distal inferior wall are normal. Right Ventricle: The right ventricular size is mildly enlarged. No increase in right ventricular wall thickness. Right ventricular systolic function is moderately reduced. There is severely elevated pulmonary artery systolic pressure. The tricuspid regurgitant velocity is 4.01 m/s, and with an assumed right atrial pressure of 8 mmHg, the estimated right ventricular systolic pressure is 16.1 mmHg. Left Atrium: Left atrial size was severely dilated. Right Atrium: Right atrial size was not well visualized. Pericardium: There is no evidence of pericardial effusion. Mitral Valve: The mitral valve is normal in structure. Mild to moderate mitral annular calcification. No evidence of mitral valve regurgitation. No evidence of mitral valve  stenosis. Tricuspid Valve: The tricuspid valve is normal in structure. Tricuspid valve regurgitation is mild to moderate. No evidence of tricuspid stenosis. Aortic Valve: The aortic valve is tricuspid. Aortic valve regurgitation is not visualized. Mild aortic valve sclerosis is present, with no evidence of aortic valve stenosis. Pulmonic Valve: The pulmonic valve was normal in structure. Pulmonic valve regurgitation is not visualized. No evidence of pulmonic stenosis. Aorta: The aortic root is normal in size and structure. Venous: The inferior vena cava was not well visualized. The inferior vena cava is normal in size with greater than 50% respiratory variability, suggesting  right atrial pressure of 3 mmHg. IAS/Shunts: The interatrial septum was not well visualized.  LEFT VENTRICLE PLAX 2D LVIDd:         6.10 cm  Diastology LVIDs:         4.20 cm  LV e' medial:    4.03 cm/s LV PW:         1.30 cm  LV E/e' medial:  30.5 LV IVS:        1.40 cm  LV e' lateral:   4.79 cm/s LVOT diam:     2.00 cm  LV E/e' lateral: 25.7 LV SV:         37 LV SV Index:   16 LVOT Area:     3.14 cm  LEFT ATRIUM         Index LA diam:    5.00 cm 2.08 cm/m  AORTIC VALVE LVOT Vmax:   64.60 cm/s LVOT Vmean:  47.500 cm/s LVOT VTI:    0.119 m  AORTA Ao Root diam: 2.60 cm MITRAL VALVE                TRICUSPID VALVE MV Area (PHT): 4.39 cm     TR Peak grad:   64.3 mmHg MV Decel Time: 173 msec     TR Vmax:        401.00 cm/s MV E velocity: 123.00 cm/s MV A velocity: 50.60 cm/s   SHUNTS MV E/A ratio:  2.43         Systemic VTI:  0.12 m                             Systemic Diam: 2.00 cm Mihai Croitoru MD Electronically signed by Sanda Klein MD Signature Date/Time: 02/13/2020/12:29:05 PM    Final     EKG: Normal sinus rhythm diffuse ST elevation inferiorly with reciprocal depression in 1 and L mild interventricular conduction delay suggestive of STEMI heart rate 70  ASSESSMENT AND PLAN:  STEMI Multivessel coronary disease Coronary bypass  surgeryx4 , 2002 History of multiple PCI and stents most recently 2015 Obesity Vascular disease Diabetes Hypertension Hyperlipidemia Covid pneumonia with hypoxemia Hypoxemia Chronic renal insufficiency . Plan STEMI presentation from the emergency room pain-free when I saw him but patient with corrected, cardiac Cath Lab for emergent cardiac cath with intention to treat Continue supplemental oxygen currently on nonrebreather will potentially switch to BiPAP Consult critical care and pulmonary for treatment of respiratory failure hypoxemia Continue medications for hypertension Maintain diabetes management and control Known mild cardiomyopathy EF of 45% recently recommend continued heart failure cardiomyopathy therapy Consider nephrology input for renal insufficiency acute on chronic Pain management for  chronic pain  Signed: Yolonda Kida MD 02/17/2020, 7:29 PM

## 2020-02-18 NOTE — Consult Note (Signed)
Pharmacy Antibiotic Note  Bobby Lindsey is a 65 y.o. male admitted on 03/03/2020.  Pharmacy has been consulted for Cefepime dosing for HCAP  Plan:  Initiate Cefepime 2 gram q12h due to reduced renal function  Will follow along closely for potential dose adjustments due to renal function  Height: 5\' 11"  (180.3 cm) Weight: 117.9 kg (260 lb) IBW/kg (Calculated) : 75.3  Temp (24hrs), Avg:98.5 F (36.9 C), Min:98.1 F (36.7 C), Max:98.9 F (37.2 C)  Recent Labs  Lab 02/12/20 0815 02/13/20 0358 02/14/20 0555 02/15/20 0310 02/16/20 0950 02/17/2020 1713  WBC  --  4.5 6.0 4.8 7.0 6.8  CREATININE  --  1.23 1.34* 1.38* 1.43* 2.61*  LATICACIDVEN 1.1  --   --   --   --   --     Estimated Creatinine Clearance: 36.8 mL/min (A) (by C-G formula based on SCr of 2.61 mg/dL (H)).    Allergies  Allergen Reactions  . Other Other (See Comments)    Steroids- "BGL goes SKY-HIGH"  . Simvastatin Diarrhea and Other (See Comments)    Fatigue, also      Antimicrobials this admission: n/a  Dose adjustments this admission: n/a  Microbiology results: 01/05 BCx: pending   Thank you for allowing pharmacy to be a part of this patient's care.  Dorothe Pea, PharmD, BCPS Clinical Pharmacist  03/08/2020 8:48 PM

## 2020-02-19 ENCOUNTER — Other Ambulatory Visit: Payer: Self-pay | Admitting: Cardiology

## 2020-02-19 ENCOUNTER — Encounter: Payer: Self-pay | Admitting: Internal Medicine

## 2020-02-19 DIAGNOSIS — J9601 Acute respiratory failure with hypoxia: Secondary | ICD-10-CM

## 2020-02-19 DIAGNOSIS — I2101 ST elevation (STEMI) myocardial infarction involving left main coronary artery: Secondary | ICD-10-CM

## 2020-02-19 DIAGNOSIS — J9602 Acute respiratory failure with hypercapnia: Secondary | ICD-10-CM

## 2020-02-19 DIAGNOSIS — E118 Type 2 diabetes mellitus with unspecified complications: Secondary | ICD-10-CM

## 2020-02-19 DIAGNOSIS — I255 Ischemic cardiomyopathy: Secondary | ICD-10-CM

## 2020-02-19 DIAGNOSIS — U071 COVID-19: Secondary | ICD-10-CM

## 2020-02-19 DIAGNOSIS — Z951 Presence of aortocoronary bypass graft: Secondary | ICD-10-CM

## 2020-02-19 DIAGNOSIS — R0602 Shortness of breath: Secondary | ICD-10-CM

## 2020-02-19 LAB — COMPREHENSIVE METABOLIC PANEL
ALT: 36 U/L (ref 0–44)
AST: 77 U/L — ABNORMAL HIGH (ref 15–41)
Albumin: 2.9 g/dL — ABNORMAL LOW (ref 3.5–5.0)
Alkaline Phosphatase: 57 U/L (ref 38–126)
Anion gap: 12 (ref 5–15)
BUN: 96 mg/dL — ABNORMAL HIGH (ref 8–23)
CO2: 25 mmol/L (ref 22–32)
Calcium: 8.1 mg/dL — ABNORMAL LOW (ref 8.9–10.3)
Chloride: 95 mmol/L — ABNORMAL LOW (ref 98–111)
Creatinine, Ser: 2.37 mg/dL — ABNORMAL HIGH (ref 0.61–1.24)
GFR, Estimated: 30 mL/min — ABNORMAL LOW (ref >60)
Glucose, Bld: 178 mg/dL — ABNORMAL HIGH (ref 70–99)
Potassium: 4.7 mmol/L (ref 3.5–5.1)
Sodium: 132 mmol/L — ABNORMAL LOW (ref 135–145)
Total Bilirubin: 1.1 mg/dL (ref 0.3–1.2)
Total Protein: 6.8 g/dL (ref 6.5–8.1)

## 2020-02-19 LAB — CBC WITH DIFFERENTIAL/PLATELET
Abs Immature Granulocytes: 0.05 10*3/uL (ref 0.00–0.07)
Basophils Absolute: 0 10*3/uL (ref 0.0–0.1)
Basophils Relative: 0 %
Eosinophils Absolute: 0 10*3/uL (ref 0.0–0.5)
Eosinophils Relative: 0 %
HCT: 46.6 % (ref 39.0–52.0)
Hemoglobin: 15.7 g/dL (ref 13.0–17.0)
Immature Granulocytes: 1 %
Lymphocytes Relative: 11 %
Lymphs Abs: 0.6 10*3/uL — ABNORMAL LOW (ref 0.7–4.0)
MCH: 28.1 pg (ref 26.0–34.0)
MCHC: 33.7 g/dL (ref 30.0–36.0)
MCV: 83.5 fL (ref 80.0–100.0)
Monocytes Absolute: 0.3 10*3/uL (ref 0.1–1.0)
Monocytes Relative: 5 %
Neutro Abs: 4.9 10*3/uL (ref 1.7–7.7)
Neutrophils Relative %: 83 %
Platelets: 114 10*3/uL — ABNORMAL LOW (ref 150–400)
RBC: 5.58 MIL/uL (ref 4.22–5.81)
RDW: 15.1 % (ref 11.5–15.5)
WBC: 5.8 10*3/uL (ref 4.0–10.5)
nRBC: 0 % (ref 0.0–0.2)

## 2020-02-19 LAB — HEPARIN LEVEL (UNFRACTIONATED): Heparin Unfractionated: 0.39 IU/mL (ref 0.30–0.70)

## 2020-02-19 LAB — GLUCOSE, CAPILLARY
Glucose-Capillary: 185 mg/dL — ABNORMAL HIGH (ref 70–99)
Glucose-Capillary: 180 mg/dL — ABNORMAL HIGH (ref 70–99)
Glucose-Capillary: 175 mg/dL — ABNORMAL HIGH (ref 70–99)
Glucose-Capillary: 166 mg/dL — ABNORMAL HIGH (ref 70–99)
Glucose-Capillary: 263 mg/dL — ABNORMAL HIGH (ref 70–99)
Glucose-Capillary: 305 mg/dL — ABNORMAL HIGH (ref 70–99)
Glucose-Capillary: 325 mg/dL — ABNORMAL HIGH (ref 70–99)
Glucose-Capillary: 331 mg/dL — ABNORMAL HIGH (ref 70–99)

## 2020-02-19 LAB — C-REACTIVE PROTEIN
CRP: 19.3 mg/dL — ABNORMAL HIGH (ref <1.0)
CRP: 18.7 mg/dL — ABNORMAL HIGH (ref <1.0)

## 2020-02-19 LAB — MAGNESIUM: Magnesium: 2.3 mg/dL (ref 1.7–2.4)

## 2020-02-19 LAB — PHOSPHORUS: Phosphorus: 4.2 mg/dL (ref 2.5–4.6)

## 2020-02-19 LAB — FERRITIN: Ferritin: 1379 ng/mL — ABNORMAL HIGH (ref 24–336)

## 2020-02-19 LAB — PROTIME-INR
INR: 4.1 (ref 0.8–1.2)
Prothrombin Time: 38.8 seconds — ABNORMAL HIGH (ref 11.4–15.2)

## 2020-02-19 LAB — MRSA PCR SCREENING: MRSA by PCR: NEGATIVE

## 2020-02-19 LAB — D-DIMER, QUANTITATIVE
D-Dimer, Quant: 0.92 ug/mL-FEU — ABNORMAL HIGH (ref 0.00–0.50)
D-Dimer, Quant: 0.72 ug/mL-FEU — ABNORMAL HIGH (ref 0.00–0.50)

## 2020-02-19 MED ORDER — INSULIN ASPART 100 UNIT/ML ~~LOC~~ SOLN
0.0000 [IU] | Freq: Three times a day (TID) | SUBCUTANEOUS | Status: DC
  Administered 2020-02-20: 11 [IU] via SUBCUTANEOUS
  Administered 2020-02-20: 4 [IU] via SUBCUTANEOUS
  Administered 2020-02-20: 15 [IU] via SUBCUTANEOUS
  Filled 2020-02-19 (×3): qty 1

## 2020-02-19 MED ORDER — ATORVASTATIN CALCIUM 20 MG PO TABS
80.0000 mg | ORAL_TABLET | Freq: Every day | ORAL | Status: DC
Start: 2020-02-19 — End: 2020-02-21
  Administered 2020-02-19 – 2020-02-20 (×2): 80 mg via ORAL
  Filled 2020-02-19 (×2): qty 4

## 2020-02-19 MED ORDER — ISOSORBIDE MONONITRATE ER 30 MG PO TB24
30.0000 mg | ORAL_TABLET | Freq: Every day | ORAL | Status: DC
  Administered 2020-02-19 – 2020-02-20 (×2): 30 mg via ORAL
  Filled 2020-02-19 (×2): qty 1

## 2020-02-19 MED ORDER — INSULIN GLARGINE 100 UNIT/ML ~~LOC~~ SOLN
20.0000 [IU] | Freq: Every day | SUBCUTANEOUS | Status: DC
  Administered 2020-02-19 – 2020-02-23 (×5): 20 [IU] via SUBCUTANEOUS
  Filled 2020-02-19 (×6): qty 0.2

## 2020-02-19 MED ORDER — ASPIRIN EC 81 MG PO TBEC
81.0000 mg | DELAYED_RELEASE_TABLET | Freq: Every day | ORAL | Status: DC
Start: 2020-02-19 — End: 2020-02-21
  Administered 2020-02-19 – 2020-02-20 (×2): 81 mg via ORAL
  Filled 2020-02-19 (×2): qty 1

## 2020-02-19 MED ORDER — CHLORHEXIDINE GLUCONATE CLOTH 2 % EX PADS
6.0000 | MEDICATED_PAD | Freq: Every day | CUTANEOUS | Status: DC
  Administered 2020-02-21 – 2020-02-26 (×6): 6 via TOPICAL

## 2020-02-19 MED ORDER — INSULIN ASPART 100 UNIT/ML ~~LOC~~ SOLN
0.0000 [IU] | Freq: Every day | SUBCUTANEOUS | Status: DC
  Administered 2020-02-19: 4 [IU] via SUBCUTANEOUS
  Filled 2020-02-19: qty 1

## 2020-02-19 NOTE — Evaluation (Signed)
Occupational Therapy Evaluation Patient Details Name: Bobby Lindsey MRN: [id number] DOB: [birth year] Today's Date: 02/19/2020    History of Present Illness 65 year old person living with chronic heart failure with mildly reduced ejection fraction, diabetes, hypercoagulable state on anticoagulation, peripheral artery disease with a left below-knee amputation, admitted to our service with acute hypoxic respiratory failure due to COVID-19 pneumonia. He is not vaccinated to the SARS-CoV-2 virus, had a significant exposure with family on Christmas Eve.   Clinical Impression   Patient presenting with decreased I in self care, balance, functional mobility/transfers, endurance, and safety awareness. Patient reports living at home with wife PTA. She is currrently at home sick with covid per pt report. Pt reports he uses SPC when ambulating with prosthesis donned short distance ( from home to car ). Pt reports once he is in the home he transfer into wheelchair and that is how he manages in home. He does sink bathing and uses urinal throughout the day. He does ambulate into bathroom for BM only. Pt with limited R shoulder AROM secondary to pt reporting, "2 weeks ago they told me I needed a shoulder replacement and my rotatory cuff is shot". Pt on 15L O2 via [place] this session with O2 saturation dropping to 85% getting to EOB with min A. Pt rolls L <> R with min - mod A for hygiene this session and fatigues quickly. OT assists with repositioning in bed. Patient will benefit from acute OT to increase overall independence in the areas of ADLs, functional mobility, safety awareness in order to safely discharge to next venue of care.    Follow Up Recommendations  SNF;Supervision/Assistance - 24 hour    Equipment Recommendations  Other (comment) (defer to next venue of care)       Precautions / Restrictions Precautions Precautions: Fall      Mobility Bed Mobility Overal bed mobility: Needs Assistance Bed  Mobility: Rolling;Supine to Sit;Sit to Supine Rolling: Min assist   Supine to sit: Min assist Sit to supine: Min assist   General bed mobility comments: min cuing for hand placement and technique    Transfers                 General transfer comment: deferred secondary to fatigue and safety    Balance Overall balance assessment: Needs assistance Sitting-balance support: Feet unsupported;Single extremity supported Sitting balance-Leahy Scale: Fair                                     ADL either performed or assessed with clinical judgement   ADL Overall ADL's : Needs assistance/impaired Eating/Feeding: Set up;Bed level Eating/Feeding Details (indicate cue type and reason): Pt using non- dominant hand with difficulty Grooming: Wash/dry hands;Wash/dry face;Set up                                       Vision Patient Visual Report: No change from baseline              Pertinent Vitals/Pain Pain Assessment: Faces Faces Pain Scale: Hurts little more Pain Location: R shoulder Pain Descriptors / Indicators: Discomfort;Aching Pain Intervention(s): Limited activity within patient's tolerance;Repositioned;Monitored during session     Hand Dominance Right   Extremity/Trunk Assessment Upper Extremity Assessment Upper Extremity Assessment: RUE deficits/detail RUE Deficits / Details: elbow, wrist, digits WFLs. Pt unable  to lift shoulder actively and reports being told 2 weeks ago he needed a shoulder replacement   Lower Extremity Assessment Lower Extremity Assessment: Defer to PT evaluation       Communication Communication Communication: HOH   Cognition Arousal/Alertness: Awake/alert Behavior During Therapy: WFL for tasks assessed/performed Overall Cognitive Status: Impaired/Different from baseline Area of Impairment: Orientation;Following commands;Safety/judgement;Awareness                 Orientation Level: Disoriented  to;Time     Following Commands: Follows one step commands consistently;Follows one step commands with increased time Safety/Judgement: Decreased awareness of safety;Decreased awareness of deficits Awareness: Emergent   General Comments: Pt pleasant and cooperative but self limiting and needing encouragement for participation              Home Living Family/patient expects to be discharged to:: Private residence Living Arrangements: Spouse/significant other Available Help at Discharge: Family;Available 24 hours/day Type of Home: House Home Access: Ramped entrance     Home Layout: One level         Biochemist, clinical: Standard     Home Equipment: Cane - quad;Wheelchair - Education administrator (comment)   Additional Comments: L LE prosthesis      Prior Functioning/Environment          Comments: 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        OT Problem List: Decreased strength;Pain;Decreased activity tolerance;Impaired balance (sitting and/or standing);Decreased safety awareness;Decreased cognition;Impaired UE functional use      OT Treatment/Interventions: Self-care/ADL training;Balance training;Therapeutic exercise;Energy conservation;Therapeutic activities;DME and/or AE instruction;Patient/family education    OT Goals(Current goals can be found in the care plan section) Acute Rehab OT Goals Patient Stated Goal: to get stronger OT Goal Formulation: With patient Time For Goal Achievement: 03/04/20 Potential to Achieve Goals: Good ADL Goals Pt Will Perform Grooming: with set-up;sitting Pt Will Perform Upper Body Bathing: with set-up;sitting Pt Will Perform Lower Body Bathing: with set-up;sitting/lateral leans Pt Will Transfer to Toilet: with min guard assist;bedside commode Pt Will Perform Toileting - Clothing Manipulation and hygiene: with min guard assist;sit to/from stand  OT Frequency: Min 1X/week   Barriers to D/C: Other (comment)  wife at home  with covid as well          AM-PAC OT "6 Clicks" Daily Activity     Outcome Measure Help from another person eating meals?: A Little Help from another person taking care of personal grooming?: A Little Help from another person toileting, which includes using toliet, bedpan, or urinal?: Total Help from another person bathing (including washing, rinsing, drying)?: A Lot Help from another person to put on and taking off regular upper body clothing?: A Little Help from another person to put on and taking off regular lower body clothing?: Total 6 Click Score: 13   End of Session Equipment Utilized During Treatment: Oxygen Nurse Communication: Mobility status;Precautions  Activity Tolerance: Patient limited by fatigue Patient left: in bed;with call bell/phone within reach;with bed alarm set  OT Visit Diagnosis: Unsteadiness on feet (R26.81)                Time: 1941-7408 OT Time Calculation (min): 64 min Charges:  OT General Charges $OT Visit: 1 Visit OT Evaluation $OT Eval Low Complexity: 1 Low OT Treatments $Self Care/Home Management : 23-37 mins $Therapeutic Activity: 8-22 mins  Darleen Crocker, MS, OTR/L , CBIS ascom [phone]  02/19/20, 4:26 PM

## 2020-02-19 NOTE — Progress Notes (Signed)
CHIEF COMPLAINT:   Chief Complaint  Patient presents with  . Code STEMI    Subjective  S/p CATH Alert and awake Off biPAP +COVID +Ischemic cardiomyopathy     Objective   Examination:  General exam: Appears calm and comfortable  Respiratory system: Clear to auscultation. Respiratory effort normal. HEENT: [place]/AT, PERRLA, no thrush, no stridor. Cardiovascular system: S1 & S2 heard, RRR. No JVD, murmurs, rubs, gallops or clicks. No pedal edema.  Physical Examination:   General Appearance: No distress  Neuro:without focal findings,  speech normal,  HEENT: PERRLA, EOM intact.   Pulmonary: normal breath sounds, No wheezing.     ALL OTHER ROS ARE NEGATIVE   VITALS:  height is 5\' 11"  (1.803 m) and weight is 110.4 kg. His oral temperature is 99.2 F (37.3 C). His blood pressure is 115/55 (abnormal) and his pulse is 53 (abnormal). His respiration is 27 (abnormal) and oxygen saturation is 93%.   I personally reviewed Labs under Results section.  Radiology Reports CARDIAC CATHETERIZATION  Result Date: 02/20/2020  Prox LAD lesion is 100% stenosed.  Prox Cx to Mid Cx lesion is 100% stenosed.  Prox RCA lesion is 99% stenosed.  Dist RCA lesion is 100% stenosed.  Ost RCA to Prox RCA lesion is 50% stenosed.  Prox RCA to Mid RCA lesion is 75% stenosed.  Ost Cx to Prox Cx lesion is 50% stenosed.  Origin lesion is 100% stenosed.  Origin to Dist Graft lesion is 100% stenosed.  Origin lesion is 100% stenosed.  Mid LAD to Dist LAD lesion is 50% stenosed.    DG Chest Port 1 View  Result Date: 03/14/2020 CLINICAL DATA:  65 year old male with acute respiratory failure. EXAM: PORTABLE CHEST 1 VIEW COMPARISON:  Chest radiograph dated 02/11/2020. FINDINGS: Bilateral pulmonary opacities, new since the prior radiograph and most concerning for pneumonia. Clinical correlation and follow-up to resolution recommended. No large pleural effusion or pneumothorax. Stable cardiomegaly. Median  sternotomy wires. No acute osseous pathology. IMPRESSION: Bilateral pulmonary opacities most concerning for pneumonia. Electronically Signed   By: Elgie Collard M.D.   On: 02/14/2020 20:19       Assessment/Plan:  acute Ischemic Cardiomyopathy and acute CHF exacerbation Off biPAP Weaning off oxygen Off pressors  Needs to follow Cardiology Recs-medical management at this time STEP DOWN STATUS   Transfer to [facility] service   Aithana Kushner Santiago Glad, M.D.  [facility]  Medical Director [facility] Medical Director [facility] Cardio-Pulmonary Department

## 2020-02-19 NOTE — Telephone Encounter (Signed)
Patient went to hospital yesterday - STEMI, acute respiratory failure. Currently admitted

## 2020-02-19 NOTE — Telephone Encounter (Signed)
Please review for refills. Thanks!

## 2020-02-19 NOTE — Consult Note (Signed)
ANTICOAGULATION CONSULT NOTE -   Pharmacy Consult for heparin infusion Indication: chest pain/ACS  Allergies  Allergen Reactions  . Other Other (See Comments)    Steroids- "BGL goes SKY-HIGH"  . Simvastatin Diarrhea and Other (See Comments)    Fatigue, also      Patient Measurements: Height: 5\' 11"  (180.3 cm) Weight: 110.4 kg (243 lb 6.2 oz) IBW/kg (Calculated) : 75.3 Heparin Dosing Weight: 101.3 kg  Vital Signs: Temp: 97.7 F (36.5 C) (01/06 0353) Temp Source: Oral (01/06 0353) BP: 160/92 (01/06 0353) Pulse Rate: 71 (01/06 0300)  Labs: Recent Labs    02/16/20 0950 02/21/2020 1713 02/21/2020 2028 02/19/20 0228  HGB 16.9 16.3  --  15.7  HCT 51.6 49.3  --  46.6  PLT 118* 117*  --  114*  APTT  --  45*  --   --   LABPROT 16.2* 24.0*  --  38.8*  INR 1.4* 2.2*  --  4.1*  HEPARINUNFRC  --   --   --  0.39  CREATININE 1.43* 2.61*  --  2.37*  TROPONINIHS  --  4,704* 5,901*  --     Estimated Creatinine Clearance: 39.2 mL/min (A) (by C-G formula based on SCr of 2.37 mg/dL (H)).   Medical History: Past Medical History:  Diagnosis Date  . Anginal pain ([place])    last pm  . Atrial fibrillation ([place])    a. Transient during 03/2013 admission.  . Bradycardia    a. Bradycardia/pauses/possible Mobitz II during 03/2013 adm. Not on BB due to this.  Marland Kitchen CAD (coronary artery disease)    a. s/p CABG 2002. b. Hx Cypher stent to the RCA. c. Inf-lat STEMI 03/2013:  LHC (04/05/13):  mLAD occluded, pD1 90, apical br of Dx occluded, CFX occluded, pOM1 90-95, RCA stents patent, diff RCA 30, S-Dx occluded, S-PDA occluded, S-OM1 40-50, L-LAD patent, EF 40% with inf HK.  PCI:  Promus (2.5 x 28) DES to mid to dist CFX.  Marland Kitchen Charcot's joint of knee   . COPD (chronic obstructive pulmonary disease) ([place])   . Deep venous thrombosis ([facility])    right lower extremity  . Diabetes mellitus    a. A1C 10.7 in 03/2013.  . Diastolic CHF ([place])    a. EF 40% by cath, 55-60% during 03/2013 adm, required IV diuresis.  .  DVT, lower extremity, recurrent ([place])    a. Hx recurrent DVT per record.  . Dyslipidemia   . Elevated CK    a. Pt has refused rheum workup in the past.  . GERD (gastroesophageal reflux disease)   . History of epidermal inclusion cyst excision 07/09/2018  . History of hiatal hernia   . HTN (hypertension)    x 15 years  . Hx of cardiovascular stress test    a. Lexiscan Myoview (03/2010):  diaph atten vs inf scar, no ischemia, EF 47%; Low Risk.  Marland Kitchen Hx of echocardiogram    a. Echo (04/08/13):  Mild LVH, EF 55-60%, restrictive physiology, severe LAE, mild reduced RVSF, mild RAE.  . Leg pain    ABI 6/16:  R 1.2, L 1.1 - normal  . Myocardial infarction ([place])   . Obesity   . Peripheral neuropathy   . Pulmonary embolism ([place])   . Sleep apnea     Medications:  PTA Warfarin: 10 mg PO daily (70 mg total weekly dose) 01/05 1730: Heparin 4,000 unit IV bolus x 1 given 01/05 1829: Bivalirudin bolus + infusion >> ending 1927  Assessment: Patient is  a 24 yom that is being admitted for STEMI. Patient is on warfarin at home for history of a PE, DVT, and afib. Pharmacy has been consulted for heparin infusion for ACS. INR 2.2 on admission. Assumed last dose of warfarin 10 mg  01/04 evening   0106 0228 HL 0.39, therapeutic x 1.  Will continue Heparin at current rate and recheck HL in 6 hours to confirm.  Goal of Therapy:  Heparin level 0.3-0.7 units/ml Monitor platelets by anticoagulation protocol: Yes   Plan:   Continue heparin infusion @ 1400 units/hr   Check heparin level in 6 hours to confirm and INR with AM labs  Monitor CBC daily   Ena Dawley, PharmD Clinical Pharmacist  02/19/2020,4:04 AM

## 2020-02-19 NOTE — Progress Notes (Addendum)
Okay I will have my office call him  Progress Note  Patient Name: Bobby Lindsey Date of Encounter: 02/19/2020  [facility] Cardiologist: Minus Breeding, MD   Subjective   Continued shortness of breath, in the ICU, on several liters nasal cannula oxygen, considerable cough with productive sputum Denies any active chest pain " MI going to be okay?" Reports everybody in his family has COVID  Discussed recent cardiac catheterization with patient,  unable to open vein graft Case discussed with Dr. Clayborn Bigness in detail  Inpatient Medications    Scheduled Meds: . vitamin C  500 mg Oral Daily  . Chlorhexidine Gluconate Cloth  6 each Topical Daily  . folic acid  1 mg Oral Daily  . insulin aspart  0-15 Units Subcutaneous Q4H  . insulin glargine  15 Units Subcutaneous QHS  . methylPREDNISolone (SOLU-MEDROL) injection  20 mg Intravenous Daily  . multivitamin with minerals  1 tablet Oral Daily  . pantoprazole (PROTONIX) IV  40 mg Intravenous Q24H  . thiamine  100 mg Oral Daily  . zinc sulfate  220 mg Oral Daily   Continuous Infusions: . sodium chloride Stopped (03/01/2020 2116)  . sodium chloride 10 mL/hr at 02/19/20 1400  . norepinephrine (LEVOPHED) Adult infusion Stopped (02/19/20 0354)   PRN Meds: acetaminophen, chlorpheniramine-HYDROcodone, fluticasone, gabapentin, guaiFENesin-dextromethorphan, morphine injection   Vital Signs    Vitals:   02/19/20 1100 02/19/20 1200 02/19/20 1300 02/19/20 1400  BP: (!) 115/55 125/65 (!) 117/54 (!) 139/91  Pulse: (!) 53 (!) 51 (!) 50 67  Resp: (!) 27 (!) 24 20 16   Temp:  99.1 F (37.3 C)    TempSrc:  Oral    SpO2: 93% 92% 94% 92%  Weight:      Height:        Intake/Output Summary (Last 24 hours) at 02/19/2020 1534 Last data filed at 02/19/2020 1400 Gross per 24 hour  Intake 1004.91 ml  Output 1125 ml  Net -120.09 ml   Last 3 Weights 02/19/2020 03/07/2020 02/14/2020  Weight (lbs) 243 lb 6.2 oz 260 lb 259 lb  Weight (kg) 110.4 kg 117.935 kg  117.482 kg      Telemetry     - Personally Reviewed  ECG     - Personally Reviewed  Physical Exam   GEN:  Moderate respiratory distress, significant cough Neck:  Unable to estimate JVD Cardiac: RRR, no murmurs, rubs, or gallops.  Respiratory:  Coarse breath sounds GI: Soft, nontender, non-distended  MS: No edema; No deformity. Amputation left leg at the knee Neuro:  Nonfocal  Psych: Normal affect   Labs    High Sensitivity Troponin:   Recent Labs  Lab 02/11/20 1810 02/11/20 2055 03/07/2020 1713 02/21/2020 2028  TROPONINIHS 20* 20* 4,704* 5,901*     Chemistry Recent Labs  Lab 02/16/20 0950 02/27/2020 1713 02/16/2020 2028 02/19/20 0228  NA 135 129*  --  132*  K 4.4 4.6  --  4.7  CL 97* 89*  --  95*  CO2 26 27  --  25  GLUCOSE 108* 277*  --  178*  BUN 47* 93*  --  96*  CREATININE 1.43* 2.61*  --  2.37*  CALCIUM 9.1 8.4*  --  8.1*  PROT 7.5  --  6.5 6.8  ALBUMIN 3.5  --  2.8* 2.9*  AST 39  --  74* 77*  ALT 35  --  33 36  ALKPHOS 63  --  53 57  BILITOT 1.0  --  1.0  1.1  GFRNONAA 54* 26*  --  30*  ANIONGAP 12 13  --  12     Hematology Recent Labs  Lab 02/16/20 0950 03/14/2020 1713 02/19/20 0228  WBC 7.0 6.8 5.8  RBC 6.08* 5.82* 5.58  HGB 16.9 16.3 15.7  HCT 51.6 49.3 46.6  MCV 84.9 84.7 83.5  MCH 27.8 28.0 28.1  MCHC 32.8 33.1 33.7  RDW 15.0 15.2 15.1  PLT 118* 117* 114*    BNPNo results for input(s): BNP, PROBNP in the last 168 hours.   DDimer  Recent Labs  Lab 02/16/20 0950 02/19/2020 2028 02/19/20 0228  DDIMER 0.73* 0.92* 0.72*     Radiology    CARDIAC CATHETERIZATION  Result Date: 03/11/2020  Prox LAD lesion is 100% stenosed.  Prox Cx to Mid Cx lesion is 100% stenosed.  Prox RCA lesion is 99% stenosed.  Dist RCA lesion is 100% stenosed.  Ost RCA to Prox RCA lesion is 50% stenosed.  Prox RCA to Mid RCA lesion is 75% stenosed.  Ost Cx to Prox Cx lesion is 50% stenosed.  Origin lesion is 100% stenosed.  Origin to Dist Graft lesion is  100% stenosed.  Origin lesion is 100% stenosed.  Mid LAD to Dist LAD lesion is 50% stenosed.    DG Chest Port 1 View  Result Date: 03/06/2020 CLINICAL DATA:  65 year old male with acute respiratory failure. EXAM: PORTABLE CHEST 1 VIEW COMPARISON:  Chest radiograph dated 02/11/2020. FINDINGS: Bilateral pulmonary opacities, new since the prior radiograph and most concerning for pneumonia. Clinical correlation and follow-up to resolution recommended. No large pleural effusion or pneumothorax. Stable cardiomegaly. Median sternotomy wires. No acute osseous pathology. IMPRESSION: Bilateral pulmonary opacities most concerning for pneumonia. Electronically Signed   By: Anner Crete M.D.   On: 03/06/2020 20:19    Cardiac Studies   Echocardiogram pending  Cardiac catheterization  Prox LAD lesion is 100% stenosed.  Prox Cx to Mid Cx lesion is 100% stenosed.  Prox RCA lesion is 99% stenosed.  Dist RCA lesion is 100% stenosed.  Ost RCA to Prox RCA lesion is 50% stenosed.  Prox RCA to Mid RCA lesion is 75% stenosed.  Ost Cx to Prox Cx lesion is 50% stenosed.  Origin lesion is 100% stenosed.  Origin to Dist Graft lesion is 100% stenosed.  Origin lesion is 100% stenosed.  Mid LAD to Dist LAD lesion is 50% stenosed.      Patient Profile     65 y.o. male presenting with STEMI also with past medical history as below Multivessel coronary disease Coronary bypass surgeryx4 , 2002 History of multiple PCI and stents most recently 2015 Obesity Vascular disease Diabetes Hypertension Hyperlipidemia Covid pneumonia with hypoxemia Hypoxemia Chronic renal insufficiency  Assessment & Plan    STEMI Catheterization yesterday, all grafts are occluded, LIMA is patent to the LAD Attempt to open vein graft to the diagonal unsuccessful after numerous attempts, ballooning -Echocardiogram pending -Medications restarted included aspirin 81 mg daily, Lipitor 80, Imdur 30,  -Not on heparin  infusion his INR is supratherapeutic -We will trend troponin  Acute on chronic renal failure Likely ATN, contrast nephropathy, Creatinine up to 2.37, BUN 96 Avoid NSAIDs Encouraged oral hydration  COVID pneumonia On broad-spectrum antibiotics Steroids, followed by ICU attending Recent discharge from [facility] for similar symptoms Everyone in his family is sick he reports  Cardiomyopathy, ischemic Known ejection fraction 45%, repeat echocardiogram pending, suspect will be lower Unable to add ARB, ACE, Entresto given renal failure Blood pressure somewhat labile  115 up to 130s Restart Imdur 30  Hyperlipidemia We will restart outpatient medications including Lipitor 20, isosorbide mononitrate 30  Chronic diastolic CHF Lasix on hold at this time in the setting of worsening renal failure As outpatient typically takes torsemide   Atrial flutter As outpatient is on warfarin Prior cardioversion, maintaining normal sinus rhythm  Diabetes type 2 poorly controlled Ranging from 9-11 on his A1c Severe coronary disease as detailed above, living office LIMA to the LAD He will need to work closely with endocrinology   Total encounter time more than 35 minutes  Greater than 50% was spent in counseling and coordination of care with the patient   For questions or updates, please contact [place] Please consult [email or web address] for contact info under        Signed, Ida Rogue, MD  02/19/2020, 3:34 PM

## 2020-02-19 NOTE — Evaluation (Signed)
Physical Therapy Evaluation Patient Details Name: Bobby Lindsey MRN: [id number] DOB: [birth year] Today's Date: 02/19/2020   History of Present Illness  Pt is a 65 y.o. male presenting to hospital 1/5 with worsening SOB; recent hospitalization for COVID-19 PNA 02/12/20 to 02/16/20.  Pt admitted with acute hypoxic respiratory failure secondary COVID-19 PNA and STEMI s/p cardiac cath 02/16/2020 (unsuccesful PCI and stent placemnt to SVG).  PMH includes L BKA, a-fib, bradycardia, CAD, COPD, DVT, DM, CABG, charcot's jt of knee, diastolic CHF, MI, PN, PE, sleep apnea.  Clinical Impression  Prior to hospital admission, pt reports ambulating short distances with cane and prosthesis and used manual w/c within home; pt reports being too weak to get up when he was recently discharged home from hospital; lives with his wife in 1 level home with ramp to enter.  Currently pt is min assist with bed mobility.  Pt declined attempting to stand d/t feeling too weak.  Pt's O2 sats decreased to 86% on 15 L O2 via HFNC with activities but was 90% at rest (on 15 L HFNC) beginning/end of session.  Pt would benefit from skilled PT to address noted impairments and functional limitations (see below for any additional details).  Upon hospital discharge, pt would benefit from STR.    Follow Up Recommendations SNF    Equipment Recommendations  Rolling walker with 5" wheels    Recommendations for Other Services OT consult     Precautions / Restrictions Precautions Precautions: Fall Restrictions Weight Bearing Restrictions: No      Mobility  Bed Mobility Overal bed mobility: Needs Assistance Bed Mobility: Supine to Sit;Sit to Supine Rolling: Min assist   Supine to sit: Min assist Sit to supine: Min assist   General bed mobility comments: vc's for technique    Transfers                 General transfer comment: pt declined d/t feeling too weak  Ambulation/Gait                Stairs             Wheelchair Mobility    Modified Rankin (Stroke Patients Only)       Balance Overall balance assessment: Needs assistance Sitting-balance support: Feet supported;No upper extremity supported Sitting balance-Leahy Scale: Fair Sitting balance - Comments: steady static sitting                                     Pertinent Vitals/Pain Pain Assessment: Faces Faces Pain Scale: Hurts little more Pain Location: R shoulder Pain Descriptors / Indicators: Discomfort;Aching Pain Intervention(s): Limited activity within patient's tolerance;Repositioned;Monitored during session  HR stable and WFL throughout treatment session.    Home Living Family/patient expects to be discharged to:: Private residence Living Arrangements: Spouse/significant other Available Help at Discharge: Family;Available 24 hours/day Type of Home: House Home Access: Ramped entrance     Home Layout: One level Home Equipment: [place] - Education administrator (comment);Walker - 2 wheels Additional Comments: L LE prosthesis    Prior Function Level of Independence: Independent with assistive device(s)         Comments: Ambulates short distances with prosthesis and cane; uses manual w/c within home     Hand Dominance   Dominant Hand: Right    Extremity/Trunk Assessment   Upper Extremity Assessment Upper Extremity Assessment: Defer to OT evaluation RUE Deficits / Details: per OT eval "  elbow, wrist, digits WFLs. Pt unable to lift shoulder actively and reports being told 2 weeks ago he needed a shoulder replacement"    Lower Extremity Assessment Lower Extremity Assessment: Generalized weakness (L BKA)    Cervical / Trunk Assessment Cervical / Trunk Assessment: Other exceptions Cervical / Trunk Exceptions: forward head/shoulders  Communication   Communication: HOH  Cognition Arousal/Alertness: Awake/alert Behavior During Therapy: WFL for tasks assessed/performed Overall Cognitive  Status: Impaired/Different from baseline Area of Impairment: Orientation;Following commands;Safety/judgement;Awareness                 Orientation Level: Disoriented to;Time     Following Commands: Follows one step commands consistently;Follows one step commands with increased time Safety/Judgement: Decreased awareness of safety;Decreased awareness of deficits Awareness: Emergent   General Comments: Pt pleasant but self limiting d/t feeling weak.      General Comments   Nursing cleared pt for participation in physical therapy.  Pt agreeable to PT session.    Exercises     Assessment/Plan    PT Assessment Patient needs continued PT services  PT Problem List Decreased strength;Decreased activity tolerance;Decreased balance;Decreased mobility;Decreased knowledge of use of DME;Decreased knowledge of precautions;Cardiopulmonary status limiting activity       PT Treatment Interventions DME instruction;Gait training;Functional mobility training;Therapeutic activities;Therapeutic exercise;Balance training;Patient/family education    PT Goals (Current goals can be found in the Care Plan section)  Acute Rehab PT Goals Patient Stated Goal: to get stronger PT Goal Formulation: With patient Time For Goal Achievement: 03/04/20 Potential to Achieve Goals: Good    Frequency Min 2X/week   Barriers to discharge Decreased caregiver support      Co-evaluation               AM-PAC PT "6 Clicks" Mobility  Outcome Measure Help needed turning from your back to your side while in a flat bed without using bedrails?: None Help needed moving from lying on your back to sitting on the side of a flat bed without using bedrails?: A Little Help needed moving to and from a bed to a chair (including a wheelchair)?: A Lot Help needed standing up from a chair using your arms (e.g., wheelchair or bedside chair)?: A Lot Help needed to walk in hospital room?: A Lot Help needed climbing 3-5  steps with a railing? : Total 6 Click Score: 14    End of Session Equipment Utilized During Treatment: Gait belt Activity Tolerance: Patient tolerated treatment well Patient left: in bed;with call bell/phone within reach;with bed alarm set;with SCD's reapplied (SCD to R LE) Nurse Communication: Mobility status;Precautions PT Visit Diagnosis: Other abnormalities of gait and mobility (R26.89);Muscle weakness (generalized) (M62.81);Difficulty in walking, not elsewhere classified (R26.2)    Time: 1500-1530 PT Time Calculation (min) (ACUTE ONLY): 30 min   Charges:   PT Evaluation $PT Eval Low Complexity: 1 Low PT Treatments $Therapeutic Activity: 8-22 mins       Leitha Bleak, PT 02/19/20, 5:33 PM

## 2020-02-20 ENCOUNTER — Encounter: Payer: Self-pay | Admitting: Internal Medicine

## 2020-02-20 DIAGNOSIS — N184 Chronic kidney disease, stage 4 (severe): Secondary | ICD-10-CM

## 2020-02-20 DIAGNOSIS — I213 ST elevation (STEMI) myocardial infarction of unspecified site: Secondary | ICD-10-CM

## 2020-02-20 DIAGNOSIS — I5021 Acute systolic (congestive) heart failure: Secondary | ICD-10-CM

## 2020-02-20 DIAGNOSIS — N170 Acute kidney failure with tubular necrosis: Secondary | ICD-10-CM

## 2020-02-20 LAB — CBC WITH DIFFERENTIAL/PLATELET
Abs Immature Granulocytes: 0.05 10*3/uL (ref 0.00–0.07)
Basophils Absolute: 0 10*3/uL (ref 0.0–0.1)
Basophils Relative: 0 %
Eosinophils Absolute: 0 10*3/uL (ref 0.0–0.5)
Eosinophils Relative: 0 %
HCT: 44.8 % (ref 39.0–52.0)
Hemoglobin: 15.2 g/dL (ref 13.0–17.0)
Immature Granulocytes: 1 %
Lymphocytes Relative: 9 %
Lymphs Abs: 0.8 10*3/uL (ref 0.7–4.0)
MCH: 28.3 pg (ref 26.0–34.0)
MCHC: 33.9 g/dL (ref 30.0–36.0)
MCV: 83.4 fL (ref 80.0–100.0)
Monocytes Absolute: 0.6 10*3/uL (ref 0.1–1.0)
Monocytes Relative: 7 %
Neutro Abs: 7.1 10*3/uL (ref 1.7–7.7)
Neutrophils Relative %: 83 %
Platelets: 118 10*3/uL — ABNORMAL LOW (ref 150–400)
RBC: 5.37 MIL/uL (ref 4.22–5.81)
RDW: 14.9 % (ref 11.5–15.5)
WBC: 8.5 10*3/uL (ref 4.0–10.5)
nRBC: 0 % (ref 0.0–0.2)

## 2020-02-20 LAB — D-DIMER, QUANTITATIVE: D-Dimer, Quant: 0.78 ug/mL-FEU — ABNORMAL HIGH (ref 0.00–0.50)

## 2020-02-20 LAB — BLOOD GAS, ARTERIAL
Acid-base deficit: 6.4 mmol/L — ABNORMAL HIGH (ref 0.0–2.0)
Bicarbonate: 22.1 mmol/L (ref 20.0–28.0)
FIO2: 1
MECHVT: 500 mL
O2 Saturation: 94.5 %
PEEP: 8 cmH2O
Patient temperature: 37
RATE: 18 resp/min
pCO2 arterial: 54 mmHg — ABNORMAL HIGH (ref 32.0–48.0)
pH, Arterial: 7.22 — ABNORMAL LOW (ref 7.350–7.450)
pO2, Arterial: 87 mmHg (ref 83.0–108.0)

## 2020-02-20 LAB — COMPREHENSIVE METABOLIC PANEL
ALT: 33 U/L (ref 0–44)
AST: 55 U/L — ABNORMAL HIGH (ref 15–41)
Albumin: 2.6 g/dL — ABNORMAL LOW (ref 3.5–5.0)
Alkaline Phosphatase: 55 U/L (ref 38–126)
Anion gap: 15 (ref 5–15)
BUN: 121 mg/dL — ABNORMAL HIGH (ref 8–23)
CO2: 20 mmol/L — ABNORMAL LOW (ref 22–32)
Calcium: 8 mg/dL — ABNORMAL LOW (ref 8.9–10.3)
Chloride: 92 mmol/L — ABNORMAL LOW (ref 98–111)
Creatinine, Ser: 4.86 mg/dL — ABNORMAL HIGH (ref 0.61–1.24)
GFR, Estimated: 13 mL/min — ABNORMAL LOW (ref >60)
Glucose, Bld: 343 mg/dL — ABNORMAL HIGH (ref 70–99)
Potassium: 5.3 mmol/L — ABNORMAL HIGH (ref 3.5–5.1)
Sodium: 127 mmol/L — ABNORMAL LOW (ref 135–145)
Total Bilirubin: 0.9 mg/dL (ref 0.3–1.2)
Total Protein: 6.5 g/dL (ref 6.5–8.1)

## 2020-02-20 LAB — MAGNESIUM: Magnesium: 2.7 mg/dL — ABNORMAL HIGH (ref 1.7–2.4)

## 2020-02-20 LAB — PROTIME-INR
INR: 3.7 — ABNORMAL HIGH (ref 0.8–1.2)
Prothrombin Time: 35.9 seconds — ABNORMAL HIGH (ref 11.4–15.2)

## 2020-02-20 LAB — GLUCOSE, CAPILLARY
Glucose-Capillary: 348 mg/dL — ABNORMAL HIGH (ref 70–99)
Glucose-Capillary: 320 mg/dL — ABNORMAL HIGH (ref 70–99)
Glucose-Capillary: 286 mg/dL — ABNORMAL HIGH (ref 70–99)
Glucose-Capillary: 193 mg/dL — ABNORMAL HIGH (ref 70–99)
Glucose-Capillary: 162 mg/dL — ABNORMAL HIGH (ref 70–99)

## 2020-02-20 LAB — FERRITIN: Ferritin: 2454 ng/mL — ABNORMAL HIGH (ref 24–336)

## 2020-02-20 LAB — PHOSPHORUS: Phosphorus: 6.6 mg/dL — ABNORMAL HIGH (ref 2.5–4.6)

## 2020-02-20 LAB — C-REACTIVE PROTEIN: CRP: 13.6 mg/dL — ABNORMAL HIGH (ref <1.0)

## 2020-02-20 MED ORDER — ETOMIDATE 2 MG/ML IV SOLN
INTRAVENOUS | Status: AC
  Administered 2020-02-21: 20 mg via INTRAVENOUS
  Filled 2020-02-20: qty 10

## 2020-02-20 MED ORDER — MIDAZOLAM HCL 2 MG/2ML IJ SOLN
INTRAMUSCULAR | Status: AC
  Filled 2020-02-20: qty 4

## 2020-02-20 MED ORDER — FENTANYL CITRATE (PF) 100 MCG/2ML IJ SOLN
INTRAMUSCULAR | Status: AC
  Administered 2020-02-21: 200 ug via INTRAVENOUS
  Filled 2020-02-20: qty 4

## 2020-02-20 MED ORDER — SODIUM CHLORIDE 0.9 % IV SOLN
100.0000 mg | Freq: Every day | INTRAVENOUS | Status: DC
  Filled 2020-02-20 (×2): qty 20

## 2020-02-20 MED ORDER — INSULIN GLARGINE 100 UNIT/ML ~~LOC~~ SOLN
20.0000 [IU] | Freq: Once | SUBCUTANEOUS | Status: AC
  Administered 2020-02-20: 20 [IU] via SUBCUTANEOUS
  Filled 2020-02-20 (×2): qty 0.2

## 2020-02-20 MED ORDER — FUROSEMIDE 10 MG/ML IJ SOLN
40.0000 mg | Freq: Once | INTRAMUSCULAR | Status: AC
  Administered 2020-02-20: 40 mg via INTRAVENOUS
  Filled 2020-02-20: qty 4

## 2020-02-20 MED ORDER — SODIUM CHLORIDE 0.9 % IV SOLN
200.0000 mg | Freq: Once | INTRAVENOUS | Status: AC
  Administered 2020-02-20: 200 mg via INTRAVENOUS
  Filled 2020-02-20: qty 200

## 2020-02-20 MED ORDER — METOCLOPRAMIDE HCL 5 MG/ML IJ SOLN
10.0000 mg | Freq: Three times a day (TID) | INTRAMUSCULAR | Status: DC
  Administered 2020-02-20 – 2020-02-21 (×2): 10 mg via INTRAVENOUS
  Filled 2020-02-20 (×2): qty 2

## 2020-02-20 MED ORDER — PANTOPRAZOLE SODIUM 40 MG IV SOLR
40.0000 mg | Freq: Two times a day (BID) | INTRAVENOUS | Status: DC
  Administered 2020-02-21 – 2020-02-26 (×11): 40 mg via INTRAVENOUS
  Filled 2020-02-20 (×11): qty 40

## 2020-02-20 MED ORDER — MORPHINE SULFATE (PF) 2 MG/ML IV SOLN
2.0000 mg | Freq: Once | INTRAVENOUS | Status: AC
  Administered 2020-02-21: 2 mg via INTRAVENOUS

## 2020-02-20 MED ORDER — VECURONIUM BROMIDE 10 MG IV SOLR
INTRAVENOUS | Status: AC
  Administered 2020-02-21: 10 mg via INTRAVENOUS
  Filled 2020-02-20: qty 10

## 2020-02-20 NOTE — Progress Notes (Signed)
Okay I will have my office call him  Progress Note  Patient Name: Bobby Lindsey Date of Encounter: 02/20/2020  [facility] Cardiologist: Minus Breeding, MD   Subjective   Does not feel well, reports his ears hurt Some shortness of breath Still coughing, some sputum Work with PT, hypoxia without high flow nasal cannula oxygen  Renal function worsening  Inpatient Medications    Scheduled Meds: . vitamin C  500 mg Oral Daily  . aspirin EC  81 mg Oral Daily  . atorvastatin  80 mg Oral Daily  . Chlorhexidine Gluconate Cloth  6 each Topical Daily  . folic acid  1 mg Oral Daily  . insulin aspart  0-20 Units Subcutaneous TID WC  . insulin aspart  0-5 Units Subcutaneous QHS  . insulin glargine  20 Units Subcutaneous QHS  . insulin glargine  20 Units Subcutaneous Once  . isosorbide mononitrate  30 mg Oral Daily  . methylPREDNISolone (SOLU-MEDROL) injection  20 mg Intravenous Daily  . multivitamin with minerals  1 tablet Oral Daily  . pantoprazole (PROTONIX) IV  40 mg Intravenous Q24H  . thiamine  100 mg Oral Daily  . zinc sulfate  220 mg Oral Daily   Continuous Infusions: . sodium chloride Stopped (03/11/2020 2116)  . sodium chloride 10 mL/hr at 02/20/20 0200  . norepinephrine (LEVOPHED) Adult infusion Stopped (02/19/20 0354)  . [START ON 02/21/2020] remdesivir 100 mg in NS 100 mL     PRN Meds: acetaminophen, chlorpheniramine-HYDROcodone, fluticasone, gabapentin, guaiFENesin-dextromethorphan, morphine injection   Vital Signs    Vitals:   02/20/20 0800 02/20/20 0900 02/20/20 1000 02/20/20 1100  BP: 108/82 137/79 (!) 129/115 (!) 134/113  Pulse: (!) 43 (!) 58 (!) 56 (!) 59  Resp: (!) 22 (!) 24 (!) 26 16  Temp:      TempSrc:      SpO2: 93% 91% (!) 89% (!) 89%  Weight:      Height:        Intake/Output Summary (Last 24 hours) at 02/20/2020 1301 Last data filed at 02/20/2020 0400 Gross per 24 hour  Intake 631.81 ml  Output 400 ml  Net 231.81 ml   Last 3 Weights 02/20/2020  02/19/2020 03/13/2020  Weight (lbs) 241 lb 6.5 oz 243 lb 6.2 oz 260 lb  Weight (kg) 109.5 kg 110.4 kg 117.935 kg      Telemetry    Normal sinus rhythm- Personally Reviewed  ECG     - Personally Reviewed  Physical Exam   Constitutional: Mild respiratory distress, otherwise appears comfortable " Ears hurt" HENT:  Head: Grossly normal Eyes:  no discharge. No scleral icterus.  Neck: Unable to estimate JVD, no carotid bruits  Cardiovascular: Regular rate and rhythm, no murmurs appreciated Pulmonary/Chest: Coarse breath sounds, scattered rales Abdominal: Soft.  no distension.  no tenderness.  Musculoskeletal: Normal range of motion Amputation left lower extremity at the knee Neurological:  normal muscle tone. Coordination normal. No atrophy Skin: Skin warm and dry Psychiatric: normal affect, pleasant   Labs    High Sensitivity Troponin:   Recent Labs  Lab 02/11/20 1810 02/11/20 2055 03/05/2020 1713 02/21/2020 2028  TROPONINIHS 20* 20* 4,704* 5,901*     Chemistry Recent Labs  Lab 02/27/2020 1713 03/14/2020 2028 02/19/20 0228 02/20/20 0539  NA 129*  --  132* 127*  K 4.6  --  4.7 5.3*  CL 89*  --  95* 92*  CO2 27  --  25 20*  GLUCOSE 277*  --  178*  343*  BUN 93*  --  96* 121*  CREATININE 2.61*  --  2.37* 4.86*  CALCIUM 8.4*  --  8.1* 8.0*  PROT  --  6.5 6.8 6.5  ALBUMIN  --  2.8* 2.9* 2.6*  AST  --  74* 77* 55*  ALT  --  33 36 33  ALKPHOS  --  53 57 55  BILITOT  --  1.0 1.1 0.9  GFRNONAA 26*  --  30* 13*  ANIONGAP 13  --  12 15     Hematology Recent Labs  Lab 02/21/2020 1713 02/19/20 0228 02/20/20 0539  WBC 6.8 5.8 8.5  RBC 5.82* 5.58 5.37  HGB 16.3 15.7 15.2  HCT 49.3 46.6 44.8  MCV 84.7 83.5 83.4  MCH 28.0 28.1 28.3  MCHC 33.1 33.7 33.9  RDW 15.2 15.1 14.9  PLT 117* 114* 118*    BNPNo results for input(s): BNP, PROBNP in the last 168 hours.   DDimer  Recent Labs  Lab 02/16/20 0950 03/13/2020 2028 02/19/20 0228  DDIMER 0.73* 0.92* 0.72*      Radiology    CARDIAC CATHETERIZATION  Result Date: 02/19/2020  Prox LAD lesion is 100% stenosed.  Prox Cx to Mid Cx lesion is 100% stenosed.  Prox RCA lesion is 99% stenosed.  Dist RCA lesion is 100% stenosed.  Ost RCA to Prox RCA lesion is 50% stenosed.  Prox RCA to Mid RCA lesion is 75% stenosed.  Ost Cx to Prox Cx lesion is 50% stenosed.  Origin lesion is 100% stenosed.  Origin to Dist Graft lesion is 100% stenosed.  Origin lesion is 100% stenosed.  Mid LAD to Dist LAD lesion is 50% stenosed.  Conclusion STEMI presentation Negative vessels all 100% occluded proximally Patent LIMA to LAD Occluded SVGs to RCA and OM IRA SVG to diagonal 1 TIMI 0 flow thrombotic Unsuccessful PCI of SVG to diagonal Recommend conservative medical therapy   DG Chest Port 1 View  Result Date: 03/08/2020 CLINICAL DATA:  65 year old male with acute respiratory failure. EXAM: PORTABLE CHEST 1 VIEW COMPARISON:  Chest radiograph dated 02/11/2020. FINDINGS: Bilateral pulmonary opacities, new since the prior radiograph and most concerning for pneumonia. Clinical correlation and follow-up to resolution recommended. No large pleural effusion or pneumothorax. Stable cardiomegaly. Median sternotomy wires. No acute osseous pathology. IMPRESSION: Bilateral pulmonary opacities most concerning for pneumonia. Electronically Signed   By: Anner Crete M.D.   On: 02/14/2020 20:19    Cardiac Studies   Echocardiogram pending  Cardiac catheterization  Prox LAD lesion is 100% stenosed.  Prox Cx to Mid Cx lesion is 100% stenosed.  Prox RCA lesion is 99% stenosed.  Dist RCA lesion is 100% stenosed.  Ost RCA to Prox RCA lesion is 50% stenosed.  Prox RCA to Mid RCA lesion is 75% stenosed.  Ost Cx to Prox Cx lesion is 50% stenosed.  Origin lesion is 100% stenosed.  Origin to Dist Graft lesion is 100% stenosed.  Origin lesion is 100% stenosed.  Mid LAD to Dist LAD lesion is 50% stenosed.      Patient  Profile     65 y.o. male presenting with STEMI also with past medical history as below Multivessel coronary disease Coronary bypass surgeryx4 , 2002 History of multiple PCI and stents most recently 2015 Obesity Vascular disease Diabetes Hypertension Hyperlipidemia Covid pneumonia with hypoxemia Hypoxemia Chronic renal insufficiency  Assessment & Plan    STEMI Catheterization February 18, 2020  all grafts are occluded, LIMA is patent to the LAD  Attempt to open vein graft to the diagonal unsuccessful after numerous attempts, ballooning echocardiogram pending but has not been done by technician LV gram during catheterization not performed to limit dye load Tolerating aspirin 81 mg daily, Lipitor 80, Imdur 30,  Not on Plavix, INR supratherapeutic  Acute on chronic renal failure History of diabetic nephropathy, diabetes poorly controlled Likely ATN, contrast nephropathy, Creatinine u 2.37, BUN 96, now creatinine 4.8 BUN 121 potassium climbing 5.3 sodium dropping 127 Urine output dropped off -Consult nephrology  COVID pneumonia On broad-spectrum antibiotics Steroids, followed by ICU attending Recent discharge from [facility] for similar symptoms/COVID  Cardiomyopathy, ischemic Known ejection fraction 45%, repeat echocardiogram pending, suspect will be lower Unable to add ARB, ACE, Entresto given renal failure Blood pressure somewhat labile 115 up to 130s Tolerating Imdur 30 which she was taking as outpatient Given renal dysfunction/ATN, will avoid hypotension.  No further medication changes made  Hyperlipidemia Lipitor 80  Chronic diastolic CHF Lasix/torsemide on hold at this time in the setting of worsening renal failure  Atrial flutter As outpatient is on warfarin Prior cardioversion, maintaining normal sinus rhythm Has had supratherapeutic INR  Diabetes type 2 poorly controlled Ranging from 9 to 11 on his A1c On steroids for COVID  Very complicated, discussed  with nursing Critically ill, consider palliative care consult given multisystem organ failure including cardiac, pulmonary, renal  Total encounter time more than 35 minutes  Greater than 50% was spent in counseling and coordination of care with the patient   For questions or updates, please contact [place] Please consult [email or web address] for contact info under        Signed, Ida Rogue, MD  02/20/2020, 1:01 PM

## 2020-02-20 NOTE — Progress Notes (Signed)
Remdesivir - Pharmacy Brief Note   O:  ALT: 33 CXR: Bilateral pulmonary opacities most concerning for pneumonia. SpO2: 88% on HFNC 15 L/min   A/P:  Remdesivir 200 mg IVPB once followed by 100 mg IVPB daily x 4 days.   Dorena Bodo, PharmD 02/20/2020 1:24 PM

## 2020-02-20 NOTE — Progress Notes (Addendum)
GOALS OF CARE DISCUSSION  The Clinical status was relayed to family in detail. I called Wife Amy Asencio  Updated and notified of patients medical condition.     Patient with increased WOB and using accessory muscles to breathe, patient with end stage CAD with ischemic cardiomyopathy with progressive respiratory failure and renal failure  Explained to family course of therapy and the modalities     Patient with Progressive multiorgan failure with very low chance of meaningful recovery despite all aggressive and optimal medical therapy. Patient is in the Dying  Process associated with Suffering.  Patient would NOT want machines and would not want Hemodialysis   Family understands the situation. We will implement End Of life Policy  She has agreed  to DNR/DNI  Family are satisfied with Plan of action and management. All questions answered    Corrin Parker, M.D.  [facility]  Medical Director [place] Director [facility] Cardio-Pulmonary Department

## 2020-02-20 NOTE — Progress Notes (Signed)
After further discussion with ICU team, the patients wife reversed CODE status and patient wants to be intubated and would like hemodialysis if needed.  Patient with progressive resp failure with end stage ischemic cardiomyopathy with end stage CHF with progressive renal failure.  Patients wife also has REFUSED REMDESIVIR therapy at this time.   Will plan to reverse CODE status to FULL CODE. Plan for intubation(consent obtained) patient at high risk for cardiac arrest while intubating.   Overall prognosis is grave    Corrin Parker, M.D.  [facility]  Medical Director [place] Director [facility] Cardio-Pulmonary Department

## 2020-02-20 NOTE — Consult Note (Signed)
[facility]  CONSULT NOTE    Date: 02/20/2020                  Patient Name:  Bobby Lindsey  MRN: [id number]  DOB: 28-Jan-1956  Age / Sex: 65 y.o., male         PCP: Lynnea Ferrier, MD                 Service Requesting Consult: Dr. Belia Heman                 Reason for Consult: Acute kidney injury            History of Present Illness: Mr. Bobby Lindsey admitted to [facility] for code STEMI. He underwent cardiac catheterization not amendable for stent placement. Medical management recommended. Patient's creatinine rose today and urine output dropped. Nephrology consulted.  Patient states he is having shortness of breath, wheezing and coughing. He is getting short of breath when talking. He feels more weak today.   Patient was admitted to [facility] from 12/29 to 1/3 for COVID-19 pneumonia. Hospital course was complicated by atrial fibrillation.    Medications: Outpatient medications: Medications Prior to Admission  Medication Sig Dispense Refill Last Dose  . atorvastatin (LIPITOR) 20 MG tablet TAKE 1 TABLET BY MOUTH EVERY DAY (Patient taking differently: Take 20 mg by mouth daily.) 90 tablet 2 02/17/2020 at 2000  . gabapentin (NEURONTIN) 300 MG capsule Take 600 mg by mouth at bedtime as needed (for neuropathy).   02/17/2020 at 2100  . guaiFENesin-dextromethorphan (ROBITUSSIN DM) 100-10 MG/5ML syrup Take 10 mLs by mouth every 4 (four) hours as needed for cough. 118 mL 0 Past Week at Unknown time  . insulin glargine, 1 Unit Dial, (TOUJEO SOLOSTAR) 300 UNIT/ML Solostar Pen Inject 80 Units into the skin daily before breakfast.   02/27/2020 at 0900  . insulin lispro (HUMALOG) 100 UNIT/ML injection Inject 20-30 Units into the skin 3 (three) times daily before meals.   02/17/2020 at 1000  . isosorbide mononitrate (IMDUR) 30 MG 24 hr tablet TAKE 1 TABLET BY MOUTH EVERY DAY (Patient taking differently: Take 30 mg by mouth daily.) 90 tablet 3 02/16/2020 at 1000  . losartan (COZAAR)  25 MG tablet TAKE 1 TABLET BY MOUTH EVERY DAY (Patient taking differently: Take 25 mg by mouth daily.) 90 tablet 3 03/05/2020 at 1000  . metFORMIN (GLUCOPHAGE-XR) 500 MG 24 hr tablet Take 500 mg by mouth daily with breakfast.   03/14/2020 at 1000  . NON FORMULARY Take 25 mg by mouth See admin instructions. CBD oil 25 mg capsules- Take 25 mg (1 capsule) by mouth in the morning   02/24/2020 at 1000  . Omega-3 Fatty Acids (FISH OIL) 1200 MG CAPS Take 1,200 mg by mouth in the morning.   02/16/2020 at 1000  . pantoprazole (PROTONIX) 40 MG tablet TAKE 1 TABLET BY MOUTH EVERY DAY IN THE EVENING (Patient taking differently: Take 40 mg by mouth at bedtime.) 90 tablet 3 02/17/2020 at 2100  . potassium chloride (KLOR-CON) 20 MEQ tablet Take 1 tablet (20 mEq total) by mouth daily. (Patient taking differently: Take 20-40 mEq by mouth See admin instructions. Take 20 mEq by mouth in the morning on Mon/Tues/Wed/Thurs/Fri and mEq on Sun/Sat) 90 tablet 1 02/24/2020 at 1000  . torsemide (DEMADEX) 20 MG tablet Take 3 tablets (60 mg total) by mouth 2 (two) times daily. Take 40 mg BID PO M-F and Take 60 mg BID PO  Saturdays and Sundays (Patient taking differently: Take 60 mg by mouth 2 (two) times daily.) 360 tablet 3 03/03/2020 at 1000  . acetaminophen (TYLENOL) 500 MG tablet Take 1,000 mg every 8 (eight) hours as needed by mouth for mild pain or headache.    unknown at prn  . Blood Glucose Monitoring Suppl (FIFTY50 GLUCOSE METER 2.0) w/Device KIT Use as directed.     . Continuous Blood Gluc Sensor (FREESTYLE LIBRE 14 DAY SENSOR) MISC Inject 1 patch into the skin every 14 (fourteen) days.     . fluticasone (FLONASE) 50 MCG/ACT nasal spray Place 2 sprays into both nostrils daily as needed for allergies or rhinitis.    unknown at prn  . Insulin Syringe-Needle U-100 31G X 5/16" 0.3 ML MISC use as directed     . nitroGLYCERIN (NITROSTAT) 0.4 MG SL tablet Place 1 tablet (0.4 mg total) under the tongue every 5 (five) minutes as needed for chest  pain. 25 tablet 2 unknown at prn    Current medications: Current Facility-Administered Medications  Medication Dose Route Frequency Provider Last Rate Last Admin  . 0.9 %  sodium chloride infusion   Intravenous Continuous Phineas Semen, MD   Stopped at 02/21/2020 2116  . 0.9 %  sodium chloride infusion  250 mL Intravenous Continuous Callwood, Dwayne D, MD 10 mL/hr at 02/20/20 0200 Infusion Verify at 02/20/20 0200  . acetaminophen (TYLENOL) tablet 650 mg  650 mg Oral Q6H PRN Eugenie Norrie, NP   650 mg at 02/20/20 0122  . ascorbic acid (VITAMIN C) tablet 500 mg  500 mg Oral Daily Eugenie Norrie, NP   500 mg at 02/20/20 4696  . aspirin EC tablet 81 mg  81 mg Oral Daily Antonieta Iba, MD   81 mg at 02/20/20 2952  . atorvastatin (LIPITOR) tablet 80 mg  80 mg Oral Daily Antonieta Iba, MD   80 mg at 02/19/20 1735  . Chlorhexidine Gluconate Cloth 2 % PADS 6 each  6 each Topical Daily Kasa, Kurian, MD      . chlorpheniramine-HYDROcodone (TUSSIONEX) 10-8 MG/5ML suspension 5 mL  5 mL Oral Q12H PRN Eugenie Norrie, NP      . fluticasone (FLONASE) 50 MCG/ACT nasal spray 2 spray  2 spray Each Nare Daily PRN Eugenie Norrie, NP      . folic acid (FOLVITE) tablet 1 mg  1 mg Oral Daily Eugenie Norrie, NP   1 mg at 02/20/20 0904  . gabapentin (NEURONTIN) capsule 600 mg  600 mg Oral QHS PRN Eugenie Norrie, NP      . guaiFENesin-dextromethorphan (ROBITUSSIN DM) 100-10 MG/5ML syrup 10 mL  10 mL Oral Q4H PRN Eugenie Norrie, NP   10 mL at 02/20/20 0129  . insulin aspart (novoLOG) injection 0-20 Units  0-20 Units Subcutaneous TID WC Eugenie Norrie, NP   11 Units at 02/20/20 1128  . insulin aspart (novoLOG) injection 0-5 Units  0-5 Units Subcutaneous QHS Eugenie Norrie, NP   4 Units at 02/19/20 2215  . insulin glargine (LANTUS) injection 20 Units  20 Units Subcutaneous QHS Eugenie Norrie, NP   20 Units at 02/19/20 2216  . insulin glargine (LANTUS) injection 20 Units  20 Units Subcutaneous  Once Gillis Santa, MD      . isosorbide mononitrate (IMDUR) 24 hr tablet 30 mg  30 mg Oral Daily Antonieta Iba, MD   30 mg at 02/19/20 1735  . methylPREDNISolone sodium succinate (SOLU-MEDROL) 40  mg/mL injection 20 mg  20 mg Intravenous Daily Eugenie Norrie, NP   20 mg at 02/20/20 0911  . morphine 2 MG/ML injection 1-2 mg  1-2 mg Intravenous Q4H PRN Eugenie Norrie, NP   2 mg at 02/19/20 2215  . multivitamin with minerals tablet 1 tablet  1 tablet Oral Daily Eugenie Norrie, NP   1 tablet at 02/20/20 0904  . norepinephrine (LEVOPHED) 4mg  in premix infusion  2-10 mcg/min Intravenous Titrated Alwyn Pea, MD   Stopped at 02/19/20 0354  . pantoprazole (PROTONIX) injection 40 mg  40 mg Intravenous Q24H Eugenie Norrie, NP   40 mg at 02/19/20 1821  . [START ON 02/21/2020] remdesivir 100 mg in sodium chloride 0.9 % 100 mL IVPB  100 mg Intravenous Daily Gillis Santa, MD      . thiamine tablet 100 mg  100 mg Oral Daily Eugenie Norrie, NP   100 mg at 02/20/20 0915  . zinc sulfate capsule 220 mg  220 mg Oral Daily Eugenie Norrie, NP   220 mg at 02/20/20 1610      Allergies: Allergies  Allergen Reactions  . Other Other (See Comments)    Steroids- "BGL goes SKY-HIGH"  . Simvastatin Diarrhea and Other (See Comments)    Fatigue, also        Past Medical History: Past Medical History:  Diagnosis Date  . Anginal pain (HCC)    last pm  . Atrial fibrillation (HCC)    a. Transient during 03/2013 admission.  . Bradycardia    a. Bradycardia/pauses/possible Mobitz II during 03/2013 adm. Not on BB due to this.  Marland Kitchen CAD (coronary artery disease)    a. s/p CABG 2002. b. Hx Cypher stent to the RCA. c. Inf-lat STEMI 03/2013:  LHC (04/05/13):  mLAD occluded, pD1 90, apical br of Dx occluded, CFX occluded, pOM1 90-95, RCA stents patent, diff RCA 30, S-Dx occluded, S-PDA occluded, S-OM1 40-50, L-LAD patent, EF 40% with inf HK.  PCI:  Promus (2.5 x 28) DES to mid to dist CFX.  Marland Kitchen Charcot's  joint of knee   . COPD (chronic obstructive pulmonary disease) (HCC)   . Deep venous thrombosis (HCC)    right lower extremity  . Diabetes mellitus    a. A1C 10.7 in 03/2013.  . Diastolic CHF (HCC)    a. EF 40% by cath, 55-60% during 03/2013 adm, required IV diuresis.  . DVT, lower extremity, recurrent (HCC)    a. Hx recurrent DVT per record.  . Dyslipidemia   . Elevated CK    a. Pt has refused rheum workup in the past.  . GERD (gastroesophageal reflux disease)   . History of epidermal inclusion cyst excision 07/09/2018  . History of hiatal hernia   . HTN (hypertension)    x 15 years  . Hx of cardiovascular stress test    a. Lexiscan Myoview (03/2010):  diaph atten vs inf scar, no ischemia, EF 47%; Low Risk.  Marland Kitchen Hx of echocardiogram    a. Echo (04/08/13):  Mild LVH, EF 55-60%, restrictive physiology, severe LAE, mild reduced RVSF, mild RAE.  . Leg pain    ABI 6/16:  R 1.2, L 1.1 - normal  . Myocardial infarction (HCC)   . Obesity   . Peripheral neuropathy   . Pulmonary embolism (HCC)   . Sleep apnea      Past Surgical History: Past Surgical History:  Procedure Laterality Date  . CARDIOVERSION N/A 06/19/2018  Procedure: CARDIOVERSION;  Surgeon: Vesta Mixer, MD;  Location: [facility] ENDOSCOPY;  Service: Cardiovascular;  Laterality: N/A;  . CARDIOVERSION N/A 02/21/2019   Procedure: CARDIOVERSION;  Surgeon: Pricilla Riffle, MD;  Location: [facility] ENDOSCOPY;  Service: Cardiovascular;  Laterality: N/A;  . CORONARY ANGIOPLASTY WITH STENT PLACEMENT    . CORONARY ARTERY BYPASS GRAFT     4 time since 2002  . CORONARY BALLOON ANGIOPLASTY N/A 10-Mar-2020   Procedure: CORONARY BALLOON ANGIOPLASTY;  Surgeon: Alwyn Pea, MD;  Location: [facility] INVASIVE CV LAB;  Service: Cardiovascular;  Laterality: N/A;  svg to om  . CORONARY/GRAFT ACUTE MI REVASCULARIZATION N/A 03-10-20   Procedure: Coronary/Graft Acute MI Revascularization;  Surgeon: Alwyn Pea, MD;  Location: [facility] INVASIVE CV LAB;  Service:  Cardiovascular;  Laterality: N/A;  . LEFT HEART CATH AND CORONARY ANGIOGRAPHY N/A Mar 10, 2020   Procedure: LEFT HEART CATH AND CORONARY ANGIOGRAPHY;  Surgeon: Alwyn Pea, MD;  Location: [facility] INVASIVE CV LAB;  Service: Cardiovascular;  Laterality: N/A;  . LEFT HEART CATHETERIZATION WITH CORONARY/GRAFT ANGIOGRAM  04/05/2013   Procedure: LEFT HEART CATHETERIZATION WITH Isabel Caprice;  Surgeon: Peter M Swaziland, MD;  Location: [facility] CATH LAB;  Service: Cardiovascular;;  . left knee surgery    . LOWER EXTREMITY ANGIOGRAPHY Left 12/25/2016   Procedure: LOWER EXTREMITY ANGIOGRAPHY;  Surgeon: Annice Needy, MD;  Location: [facility] INVASIVE CV LAB;  Service: Cardiovascular;  Laterality: Left;  . PERCUTANEOUS CORONARY STENT INTERVENTION (PCI-S)  04/05/2013   Procedure: PERCUTANEOUS CORONARY STENT INTERVENTION (PCI-S);  Surgeon: Peter M Swaziland, MD;  Location: [facility] CATH LAB;  Service: Cardiovascular;;  DES to native Mid cx  . VASCULAR SURGERY       Family History: Family History  Problem Relation Age of Onset  . Heart disease Mother   . Hypertension Mother   . Cancer Father   . Hypertension Sister   . Heart attack Neg Hx   . Stroke Neg Hx      Social History: Social History   Socioeconomic History  . Marital status: Married    Spouse name: Not on file  . Number of children: Not on file  . Years of education: Not on file  . Highest education level: Not on file  Occupational History  . Occupation: CONSTRUCTION    Employer: [employer]    Comment: Product/process development scientist  Tobacco Use  . Smoking status: Never Smoker  . Smokeless tobacco: Never Used  Vaping Use  . Vaping Use: Never used  Substance and Sexual Activity  . Alcohol use: Yes    Comment: rare  . Drug use: No  . Sexual activity: Yes    Birth control/protection: None  Other Topics Concern  . 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    Social Determinants of  Health   Financial Resource Strain: Not on file  Food Insecurity: Not on file  Transportation Needs: Not on file  Physical Activity: Not on file  Stress: Not on file  Social Connections: Not on file  Intimate Partner Violence: Not on file     Review of Systems: Review of Systems  Constitutional: Negative.   HENT: Negative.   Eyes: Negative.   Respiratory: Positive for cough, sputum production, shortness of breath and wheezing. Negative for hemoptysis.   Cardiovascular: Positive for palpitations. Negative for chest pain, orthopnea, claudication, leg swelling and PND.  Gastrointestinal: Negative.   Genitourinary: Negative for dysuria, flank pain, frequency, hematuria and urgency.  Musculoskeletal: Negative for back pain, falls, joint pain, myalgias and neck pain.  Skin: Negative.   Neurological: Negative.   Endo/Heme/Allergies: Negative.   Psychiatric/Behavioral: Negative.     Vital Signs: Blood pressure 139/84, pulse 60, temperature 97.8 F (36.6 C), temperature source Axillary, resp. rate 17, height 5\' 11"  (1.803 m), weight 109.5 kg, SpO2 (!) 88 %.  Weight trends: Filed Weights   02/14/2020 1724 02/19/20 0329 02/20/20 0356  Weight: 117.9 kg 110.4 kg 109.5 kg    Physical Exam: General: Critically ill  Head: Normocephalic, atraumatic. Moist oral mucosal membranes  Eyes: Anicteric, PERRL  Neck: Supple, trachea midline  Lungs:  Bilateral crackles, left rhonchi. +wheezing  Heart: irregular  Abdomen:  Soft, nontender, obese  Extremities:  no peripheral edema. Left BKA  Neurologic: Nonfocal, moving all four extremities  Skin: No lesions  Access: none     Lab results: Basic Metabolic Panel: Recent Labs  Lab 03/07/2020 1713 02/19/20 0228 02/20/20 0539  NA 129* 132* 127*  K 4.6 4.7 5.3*  CL 89* 95* 92*  CO2 27 25 20*  GLUCOSE 277* 178* 343*  BUN 93* 96* 121*  CREATININE 2.61* 2.37* 4.86*  CALCIUM 8.4* 8.1* 8.0*  MG  --  2.3 2.7*  PHOS  --  4.2 6.6*    Liver  Function Tests: Recent Labs  Lab 03/14/2020 2028 02/19/20 0228 02/20/20 0539  AST 74* 77* 55*  ALT 33 36 33  ALKPHOS 53 57 55  BILITOT 1.0 1.1 0.9  PROT 6.5 6.8 6.5  ALBUMIN 2.8* 2.9* 2.6*   No results for input(s): LIPASE, AMYLASE in the last 168 hours. No results for input(s): AMMONIA in the last 168 hours.  CBC: Recent Labs  Lab 02/15/20 0310 02/16/20 0950 03/13/2020 1713 02/19/20 0228 02/20/20 0539  WBC 4.8 7.0 6.8 5.8 8.5  NEUTROABS 3.3 4.7 5.1 4.9 7.1  HGB 14.5 16.9 16.3 15.7 15.2  HCT 45.1 51.6 49.3 46.6 44.8  MCV 86.2 84.9 84.7 83.5 83.4  PLT 98* 118* 117* 114* 118*    Cardiac Enzymes: No results for input(s): CKTOTAL, CKMB, CKMBINDEX, TROPONINI in the last 168 hours.  BNP: Invalid input(s): POCBNP  CBG: Recent Labs  Lab 02/19/20 2100 02/19/20 2313 02/20/20 0311 02/20/20 0812 02/20/20 1105  GLUCAP 325* 331* 348* 320* 286*    Microbiology: Results for orders placed or performed during the hospital encounter of 03/05/2020  Culture, blood (Routine X 2) w Reflex to ID Panel     Status: None (Preliminary result)   Collection Time: 02/20/2020  8:28 PM   Specimen: BLOOD  Result Value Ref Range Status   Specimen Description BLOOD RIGHT ANTECUBITAL  Final   Special Requests   Final    BOTTLES DRAWN AEROBIC AND ANAEROBIC Blood Culture results may not be optimal due to an excessive volume of blood received in culture bottles   Culture   Final    NO GROWTH 2 DAYS Performed at [facility], [address], [place], [place] [address]    Report Status PENDING  Incomplete  Culture, blood (Routine X 2) w Reflex to ID Panel     Status: None (Preliminary result)   Collection Time: 03/08/2020  8:30 PM   Specimen: BLOOD  Result Value Ref Range Status   Specimen Description BLOOD BLOOD RIGHT HAND  Final   Special Requests   Final    BOTTLES DRAWN AEROBIC AND ANAEROBIC Blood Culture adequate volume   Culture   Final    NO GROWTH 2 DAYS  Performed at [facility], [address] [place], [place] [address]    Report Status PENDING  Incomplete  MRSA PCR Screening     Status: None   Collection Time: 03/12/2020 11:42 PM   Specimen: Nasopharyngeal  Result Value Ref Range Status   MRSA by PCR NEGATIVE NEGATIVE Final    Comment:        The GeneXpert MRSA Assay (FDA approved for NASAL specimens only), is one component of a comprehensive MRSA colonization surveillance program. It is not intended to diagnose MRSA infection nor to guide or monitor treatment for MRSA infections. Performed at [facility], [address] [place], [place] [address]     Coagulation Studies: Recent Labs    03/02/2020 1713 02/19/20 0228 02/20/20 0539  LABPROT 24.0* 38.8* 35.9*  INR 2.2* 4.1* 3.7*    Urinalysis: No results for input(s): COLORURINE, LABSPEC, PHURINE, GLUCOSEU, HGBUR, BILIRUBINUR, KETONESUR, PROTEINUR, UROBILINOGEN, NITRITE, LEUKOCYTESUR in the last 72 hours.  Invalid input(s): APPERANCEUR    Imaging: CARDIAC CATHETERIZATION  Result Date: 02/19/2020  Prox LAD lesion is 100% stenosed.  Prox Cx to Mid Cx lesion is 100% stenosed.  Prox RCA lesion is 99% stenosed.  Dist RCA lesion is 100% stenosed.  Ost RCA to Prox RCA lesion is 50% stenosed.  Prox RCA to Mid RCA lesion is 75% stenosed.  Ost Cx to Prox Cx lesion is 50% stenosed.  Origin lesion is 100% stenosed.  Origin to Dist Graft lesion is 100% stenosed.  Origin lesion is 100% stenosed.  Mid LAD to Dist LAD lesion is 50% stenosed.  Conclusion STEMI presentation Negative vessels all 100% occluded proximally Patent LIMA to LAD Occluded SVGs to RCA and OM IRA SVG to diagonal 1 TIMI 0 flow thrombotic Unsuccessful PCI of SVG to diagonal Recommend conservative medical therapy   DG Chest Port 1 View  Result Date: 03/05/2020 CLINICAL DATA:  65 year old male with acute respiratory failure. EXAM: PORTABLE CHEST 1 VIEW COMPARISON:  Chest radiograph dated 02/11/2020. FINDINGS:  Bilateral pulmonary opacities, new since the prior radiograph and most concerning for pneumonia. Clinical correlation and follow-up to resolution recommended. No large pleural effusion or pneumothorax. Stable cardiomegaly. Median sternotomy wires. No acute osseous pathology. IMPRESSION: Bilateral pulmonary opacities most concerning for pneumonia. Electronically Signed   By: Elgie Collard M.D.   On: 02/17/2020 20:19      Assessment & Plan: Mr. Bobby Lindsey is a 65 y.o. white male with diabetes mellitus type II, hypertension, hyperlipidemia, coronary artery disease, obstructive sleep apnea, atrial fibrillation, DVT/PE , who was admitted to [facility] on 02/28/2020 for COVID-19 [U07.1] STEMI (ST elevation myocardial infarction) [facility]) [I21.3] Patient underwent left cardiac catheterization on 1/5.   1. Acute kidney injury on chronic kidney disease stage IIIA. Baseline creatinine of 1.31 GFR of 57 on 12/15/2019. History of bland urine. 2. Hyponatremia 3. Hyperkalemia 4. Metabolic Acidosis 5. Acute exacerbation of systolic and diastolic congestive heart failure.  6. Acute respiratory failure with COVID-19 infection  History suggestive of IV contrast induced nephropathy. Nonoliguric urine output overnight but now with anuric urine output this shift.  Multiorgan failure. Discussed dialysis with patient.  - Low threshold for renal replacement therapy - IV furosemide today.   LOS: 2 Katharyn Schauer 1/7/20223:23 PM

## 2020-02-20 NOTE — Progress Notes (Signed)
[facility] Progress Note  Patient: Bobby Lindsey    UOH:[id number]  DOA: 02/17/2020     Date of Service: the patient was seen and examined on 02/20/2020  Chief Complaint  Patient presents with  . Code STEMI   Brief hospital course: Patient is 65 year old white male multiple medical problems multivessel coronary artery disease.  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  Patient recently diagnosed with Covid pneumonia and was admitted and [facility] discharged with mild hypoxemia on 2 L.  Developed worsening hypoxemia with ST segment changes according to the wife never really complained of significant chest pain but EKG met STEMI criteria so cardiology and STEMI team was mobilized.  Cardiology was consulted, admitted via STEMI to bring to the cardiac Cath Lab with intention to treat possibly with PCI and stent.  he was admitted in the ICU on 1/5 and Downgraded on 1/6, I saw this patient on 02/20/2020   Assessment and Plan: Principal problem Acute hypoxic tori failure secondary to COVID-19 pneumonia  Active problems STEMI AKI Chronic systolic CHF Atrial flutter IDDM T2    Acute hypoxic spelt failure secondary to COVID pneumonia On broad-spectrum antibiotics Continue remdesivir and Solu-Medrol 20 mg IV daily started in the ICU  Continue vitamin C and zinc supplement Recent discharge from [facility] for similar symptoms Everyone in his family is sick he reports Continue supplemental oxygen and gradually wean down   STEMI Catheterization yesterday, all grafts are occluded, LIMA is patent to the LAD Attempt to open vein graft to the diagonal unsuccessful after numerous attempts, ballooning -Echocardiogram pending -Medications restarted included  aspirin 81 mg daily, Lipitor 80, Imdur 30,  -Not on heparin infusion his INR is supratherapeutic -We will trend troponin   AKI on CKD stage III Likely ATN, contrast nephropathy, Creatinine up to 2.37--4.86 Avoid NSAIDs Encouraged oral hydration Continue IV Lasix as per nephrology Nephrology consulted, patient may need RRT   Chronic systolic CHF, ischemic cardiomyopathy Known ejection fraction 45%, repeat echocardiogram pending, suspect will be lower Unable to add ARB, ACE, Entresto given renal failure Blood pressure somewhat labile 115 up to 130s Restart Imdur 30  Hyperlipidemia We will restart outpatient medications including Lipitor 20, isosorbide mononitrate 30   Atrial flutter As outpatient is on warfarin Prior cardioversion, maintaining normal sinus rhythm  Diabetes type 2 poorly controlled Ranging from 9-11 on his A1c Severe coronary disease as detailed above, living office LIMA to the LAD He will need to work closely with endocrinology Started Lantus 20 nightly Continue NovoLog  sliding scale  Body mass index is 33.67 kg/m.  Interventions:       Diet: Full liquid diet DVT Prophylaxis: Therapeutic Anticoagulation with Coumadin   Advance goals of care discussion: Full code  Family Communication: family was not present at bedside, at the time of interview.  The pt provided permission to discuss medical plan with the family. Opportunity was given to ask question and all questions were answered satisfactorily.   Disposition:  Pt is from Home, admitted with acute hypoxic respiratory failure secondary to COVID-19 pneumonia, chest pain and renal failure, still has respiratory failure and renal failure, which precludes a safe discharge. Discharge to home, when recourse from COVID-pneumonia and renal failure.  Subjective: Patient was admitted in the ICU due to respiratory failure, and chest pain, cardiac  cath was done and patient was in the ICU.  Patient was  downgraded yesterday.  Currently patient is still having shortness of breath on 15 to her oxygen with high flow nasal cannula and he was more concerned about his uncontrolled blood glucose. he denied any chest pain or palpitations.  Physical Exam: General:  alert oriented to time, place, and person.  Appear in moderate distress, affect appropriate Eyes: PERRLA ENT: Oral Mucosa Clear, moist  Neck: no JVD,  Cardiovascular: S1 and S2 Present, no Murmur,  Respiratory: increased respiratory effort, Bilateral Air entry equal and Decreased, bilateral crackles, no significant wheezing appreciated.  Abdomen: Bowel Sound present, Soft and no tenderness,  Skin: no rashes Extremities: no Pedal edema, no calf tenderness, s/p left BKA Neurologic: without any new focal findings Gait not checked due to patient safety concerns  Vitals:   02/20/20 1100 02/20/20 1200 02/20/20 1300 02/20/20 1400  BP: (!) 134/113 135/81 120/62 139/84  Pulse: (!) 59 64 (!) 59 60  Resp: _0 Temp:      TempSrc:      SpO2: (!) 89% (!) 86% (!) 88% (!) 88%  Weight:      Height:        Intake/Output Summary (Last 24 hours) at 02/20/2020 1600 Last data filed at 02/20/2020 0400 Gross per 24 hour  Intake 116.81 ml  Output 400 ml  Net -283.19 ml   Filed Weights   03/11/2020 1724 02/19/20 0329 02/20/20 0356  Weight: 117.9 kg 110.4 kg 109.5 kg    Data Reviewed: I have personally reviewed and interpreted daily labs, tele strips, imagings as discussed above. I reviewed all nursing notes, pharmacy notes, vitals, pertinent old records I have discussed plan of care as described above with RN and patient/family.  CBC: Recent Labs  Lab 02/15/20 0310 02/16/20 0950 02/25/2020 1713 02/19/20 0228 02/20/20 0539  WBC 4.8 7.0 6.8 5.8 8.5  NEUTROABS 3.3 4.7 5.1 4.9 7.1  HGB 14.5 16.9 16.3 15.7 15.2  HCT 45.1 51.6 49.3 46.6 44.8  MCV 86.2 84.9 84.7 83.5 83.4  PLT 98* 118* 117* 114* 412*   Basic Metabolic Panel: Recent  Labs  Lab 02/15/20 0310 02/16/20 0950 03/01/2020 1713 02/19/20 0228 02/20/20 0539  NA 135 135 129* 132* 127*  K 4.6 4.4 4.6 4.7 5.3*  CL 101 97* 89* 95* 92*  CO2 _1 20*  GLUCOSE 208* 108* 277* 178* 343*  BUN 46* 47* 93* 96* 121*  CREATININE 1.38* 1.43* 2.61* 2.37* 4.86*  CALCIUM 8.7* 9.1 8.4* 8.1* 8.0*  MG  --   --   --  2.3 2.7*  PHOS  --   --   --  4.2 6.6*    Studies: No results found.  Scheduled Meds: . vitamin C  500 mg Oral Daily  . aspirin EC  81 mg Oral Daily  . atorvastatin  80 mg Oral Daily  . Chlorhexidine Gluconate Cloth  6 each Topical Daily  . folic acid  1 mg Oral Daily  . furosemide  40 mg Intravenous Once  . insulin aspart  0-20 Units Subcutaneous TID WC  . insulin aspart  0-5 Units Subcutaneous QHS  . insulin glargine  20 Units Subcutaneous QHS  . insulin glargine  20 Units Subcutaneous Once  . isosorbide mononitrate  30 mg Oral Daily  . methylPREDNISolone (SOLU-MEDROL) injection  20 mg Intravenous Daily  . multivitamin with minerals  1 tablet Oral Daily  . pantoprazole (PROTONIX) IV  40 mg Intravenous Q24H  . thiamine  100 mg Oral Daily  . zinc sulfate  220 mg Oral Daily   Continuous Infusions: . sodium chloride Stopped (02/28/2020 2116)  . sodium chloride 10 mL/hr at 02/20/20 0200  . norepinephrine (LEVOPHED) Adult infusion Stopped (02/19/20 0354)  . [START ON 02/21/2020] remdesivir 100 mg in NS 100 mL     PRN Meds: acetaminophen, chlorpheniramine-HYDROcodone, fluticasone, gabapentin, guaiFENesin-dextromethorphan, morphine injection  Time spent: 35 minutes  Author: Val Lindsey. MD [facility] 02/20/2020 4:00 PM  To reach On-call, see care teams to locate the attending and reach out to them via [email or web address]. If 7PM-7AM, please contact night-coverage If you still have difficulty reaching the attending provider, please page the [facility] (Director on Call) for [facility] on amion for assistance.

## 2020-02-20 NOTE — Progress Notes (Signed)
Inpatient Diabetes Program Recommendations  AACE/ADA: New Consensus Statement on Inpatient Glycemic Control (2015)  Target Ranges:  Prepandial:   less than 140 mg/dL      Peak postprandial:   less than 180 mg/dL (1-2 hours)      Critically ill patients:  140 - 180 mg/dL   Lab Results  Component Value Date   GLUCAP 286 (H) 02/20/2020   HGBA1C 9.5 (H) 02/17/2020    Review of Glycemic Control Results for ERMAL, HABERER (MRN [id number]) as of 02/20/2020 12:42  Ref. Range 02/19/2020 21:00 02/19/2020 23:13 02/20/2020 03:11 02/20/2020 08:12 02/20/2020 11:05  Glucose-Capillary Latest Ref Range: 70 - 99 mg/dL 325 (H) 331 (H) 348 (H) 320 (H) 286 (H)   Diabetes history: DM 2 Outpatient Diabetes medications: Toujeo 80 units daily, Humalog 20-30 units tid with meals, Metformin 500 mg with breakfast Current orders for Inpatient glycemic control:  Novolog resistant tid with meals and HS Lantus 20 units q HS (patient given additional dose of Lantus 20 units today) Solumedrol 20 mg daily Inpatient Diabetes Program Recommendations:   Please increase Lantus to 30 units q HS.  Also consider adding Novolog meal coverage 6 units tid with meals (hold if patient eats less than 50% or NPO).   Thanks,   Adah Perl, RN, BC-ADM Inpatient Diabetes Coordinator Pager [phone] (8a-5p)

## 2020-02-20 NOTE — Procedures (Signed)
Endotracheal Intubation: Patient required placement of an artificial airway secondary to Respiratory Failure  Consent: Emergent.   Hand washing performed prior to starting the procedure.   Medications administered for sedation prior to procedure:  Midazolam 4 mg IV,  Vecuronium 10 mg IV, Fentanyl 200 mcg IV.  Etomidate 20 m g IV    A time out procedure was called and correct patient, name, & ID confirmed. Needed supplies and equipment were assembled and checked to include ETT, 10 ml syringe, Glidescope, Mac and Miller blades, suction, oxygen and bag mask valve, end tidal CO2 monitor.   Patient was positioned to align the mouth and pharynx to facilitate visualization of the glottis.   Heart rate, SpO2 and blood pressure was continuously monitored during the procedure. Pre-oxygenation was conducted prior to intubation and endotracheal tube was placed through the vocal cords into the trachea.     The artificial airway was placed under direct visualization via glidescope route using a 7.5  ETT on the first attempt.  ETT was secured at 23 cm mark.  Placement was confirmed by auscuitation of lungs with good breath sounds bilaterally and no stomach sounds.  Condensation was noted on endotracheal tube.   Pulse ox 98%.  CO2 detector in place with appropriate color change.   Complications: None .   Operator: Ryelle Ruvalcaba.   Chest radiograph ordered and pending.     Corrin Parker, M.D.  [facility]  Medical Director [place] Director [facility] Cardio-Pulmonary Department

## 2020-02-20 NOTE — Progress Notes (Incomplete)
Goals of Care Discussion

## 2020-02-21 ENCOUNTER — Inpatient Hospital Stay: Payer: Medicare HMO

## 2020-02-21 DIAGNOSIS — N189 Chronic kidney disease, unspecified: Secondary | ICD-10-CM

## 2020-02-21 DIAGNOSIS — N179 Acute kidney failure, unspecified: Secondary | ICD-10-CM

## 2020-02-21 LAB — COMPREHENSIVE METABOLIC PANEL
ALT: 38 U/L (ref 0–44)
AST: 68 U/L — ABNORMAL HIGH (ref 15–41)
Albumin: 2.9 g/dL — ABNORMAL LOW (ref 3.5–5.0)
Alkaline Phosphatase: 84 U/L (ref 38–126)
Anion gap: 16 — ABNORMAL HIGH (ref 5–15)
BUN: 140 mg/dL — ABNORMAL HIGH (ref 8–23)
CO2: 22 mmol/L (ref 22–32)
Calcium: 8.3 mg/dL — ABNORMAL LOW (ref 8.9–10.3)
Chloride: 92 mmol/L — ABNORMAL LOW (ref 98–111)
Creatinine, Ser: 6.89 mg/dL — ABNORMAL HIGH (ref 0.61–1.24)
GFR, Estimated: 8 mL/min — ABNORMAL LOW (ref >60)
Glucose, Bld: 148 mg/dL — ABNORMAL HIGH (ref 70–99)
Potassium: 5.6 mmol/L — ABNORMAL HIGH (ref 3.5–5.1)
Sodium: 130 mmol/L — ABNORMAL LOW (ref 135–145)
Total Bilirubin: 1.2 mg/dL (ref 0.3–1.2)
Total Protein: 7.5 g/dL (ref 6.5–8.1)

## 2020-02-21 LAB — BLOOD GAS, VENOUS
Acid-base deficit: 4.4 mmol/L — ABNORMAL HIGH (ref 0.0–2.0)
Bicarbonate: 24.8 mmol/L (ref 20.0–28.0)
O2 Saturation: 56.8 %
Patient temperature: 37
pCO2, Ven: 62 mmHg — ABNORMAL HIGH (ref 44.0–60.0)
pH, Ven: 7.21 — ABNORMAL LOW (ref 7.250–7.430)
pO2, Ven: 37 mmHg (ref 32.0–45.0)

## 2020-02-21 LAB — CBC WITH DIFFERENTIAL/PLATELET
Abs Immature Granulocytes: 0.17 10*3/uL — ABNORMAL HIGH (ref 0.00–0.07)
Basophils Absolute: 0 10*3/uL (ref 0.0–0.1)
Basophils Relative: 0 %
Eosinophils Absolute: 0 10*3/uL (ref 0.0–0.5)
Eosinophils Relative: 0 %
HCT: 50.1 % (ref 39.0–52.0)
Hemoglobin: 16.3 g/dL (ref 13.0–17.0)
Immature Granulocytes: 1 %
Lymphocytes Relative: 4 %
Lymphs Abs: 0.6 10*3/uL — ABNORMAL LOW (ref 0.7–4.0)
MCH: 27.6 pg (ref 26.0–34.0)
MCHC: 32.5 g/dL (ref 30.0–36.0)
MCV: 84.9 fL (ref 80.0–100.0)
Monocytes Absolute: 1 10*3/uL (ref 0.1–1.0)
Monocytes Relative: 6 %
Neutro Abs: 14.8 10*3/uL — ABNORMAL HIGH (ref 1.7–7.7)
Neutrophils Relative %: 89 %
Platelets: 197 10*3/uL (ref 150–400)
RBC: 5.9 MIL/uL — ABNORMAL HIGH (ref 4.22–5.81)
RDW: 15.1 % (ref 11.5–15.5)
WBC: 16.5 10*3/uL — ABNORMAL HIGH (ref 4.0–10.5)
nRBC: 0 % (ref 0.0–0.2)

## 2020-02-21 LAB — PROTIME-INR
INR: 11.3 (ref 0.8–1.2)
INR: 10.5 (ref 0.8–1.2)
INR: 8.7 (ref 0.8–1.2)
INR: 5 (ref 0.8–1.2)
INR: 5.9 (ref 0.8–1.2)
Prothrombin Time: 84.8 seconds — ABNORMAL HIGH (ref 11.4–15.2)
Prothrombin Time: 80.5 seconds — ABNORMAL HIGH (ref 11.4–15.2)
Prothrombin Time: 69.1 seconds — ABNORMAL HIGH (ref 11.4–15.2)
Prothrombin Time: 45.3 seconds — ABNORMAL HIGH (ref 11.4–15.2)
Prothrombin Time: 51.3 seconds — ABNORMAL HIGH (ref 11.4–15.2)

## 2020-02-21 LAB — HEPATITIS B CORE ANTIBODY, IGM: Hep B C IgM: NONREACTIVE

## 2020-02-21 LAB — HEPARIN LEVEL (UNFRACTIONATED): Heparin Unfractionated: 0.64 IU/mL (ref 0.30–0.70)

## 2020-02-21 LAB — MAGNESIUM: Magnesium: 3 mg/dL — ABNORMAL HIGH (ref 1.7–2.4)

## 2020-02-21 LAB — RENAL FUNCTION PANEL
Albumin: 2.9 g/dL — ABNORMAL LOW (ref 3.5–5.0)
Anion gap: 19 — ABNORMAL HIGH (ref 5–15)
BUN: 138 mg/dL — ABNORMAL HIGH (ref 8–23)
CO2: 19 mmol/L — ABNORMAL LOW (ref 22–32)
Calcium: 8.2 mg/dL — ABNORMAL LOW (ref 8.9–10.3)
Chloride: 93 mmol/L — ABNORMAL LOW (ref 98–111)
Creatinine, Ser: 6.96 mg/dL — ABNORMAL HIGH (ref 0.61–1.24)
GFR, Estimated: 8 mL/min — ABNORMAL LOW (ref >60)
Glucose, Bld: 123 mg/dL — ABNORMAL HIGH (ref 70–99)
Phosphorus: 9.1 mg/dL — ABNORMAL HIGH (ref 2.5–4.6)
Potassium: 6.1 mmol/L — ABNORMAL HIGH (ref 3.5–5.1)
Sodium: 131 mmol/L — ABNORMAL LOW (ref 135–145)

## 2020-02-21 LAB — BLOOD GAS, ARTERIAL
Acid-base deficit: 10.8 mmol/L — ABNORMAL HIGH (ref 0.0–2.0)
Acid-base deficit: 10.9 mmol/L — ABNORMAL HIGH (ref 0.0–2.0)
Bicarbonate: 18.2 mmol/L — ABNORMAL LOW (ref 20.0–28.0)
Bicarbonate: 20.8 mmol/L (ref 20.0–28.0)
FIO2: 1
FIO2: 1
MECHVT: 500 mL
MECHVT: 550 mL
O2 Saturation: 83.3 %
O2 Saturation: 85.9 %
PEEP: 10 cmH2O
PEEP: 8 cmH2O
Patient temperature: 37
Patient temperature: 37
RATE: 18 resp/min
RATE: 24 resp/min
pCO2 arterial: 51 mmHg — ABNORMAL HIGH (ref 32.0–48.0)
pCO2 arterial: 70 mmHg (ref 32.0–48.0)
pH, Arterial: 7.08 — CL (ref 7.350–7.450)
pH, Arterial: 7.16 — CL (ref 7.350–7.450)
pO2, Arterial: 66 mmHg — ABNORMAL LOW (ref 83.0–108.0)
pO2, Arterial: 67 mmHg — ABNORMAL LOW (ref 83.0–108.0)

## 2020-02-21 LAB — C-REACTIVE PROTEIN: CRP: 14.1 mg/dL — ABNORMAL HIGH (ref <1.0)

## 2020-02-21 LAB — FERRITIN: Ferritin: 4316 ng/mL — ABNORMAL HIGH (ref 24–336)

## 2020-02-21 LAB — HEPATITIS B SURFACE ANTIBODY,QUALITATIVE: Hep B S Ab: NONREACTIVE

## 2020-02-21 LAB — GLUCOSE, CAPILLARY
Glucose-Capillary: 160 mg/dL — ABNORMAL HIGH (ref 70–99)
Glucose-Capillary: 130 mg/dL — ABNORMAL HIGH (ref 70–99)
Glucose-Capillary: 119 mg/dL — ABNORMAL HIGH (ref 70–99)
Glucose-Capillary: 111 mg/dL — ABNORMAL HIGH (ref 70–99)
Glucose-Capillary: 121 mg/dL — ABNORMAL HIGH (ref 70–99)
Glucose-Capillary: 124 mg/dL — ABNORMAL HIGH (ref 70–99)

## 2020-02-21 LAB — PHOSPHORUS: Phosphorus: 9.2 mg/dL — ABNORMAL HIGH (ref 2.5–4.6)

## 2020-02-21 LAB — TRIGLYCERIDES: Triglycerides: 156 mg/dL — ABNORMAL HIGH (ref <150)

## 2020-02-21 LAB — D-DIMER, QUANTITATIVE: D-Dimer, Quant: 17.37 ug/mL-FEU — ABNORMAL HIGH (ref 0.00–0.50)

## 2020-02-21 LAB — APTT: aPTT: >160 seconds (ref 24–36)

## 2020-02-21 LAB — HEPATITIS C ANTIBODY: HCV Ab: NONREACTIVE

## 2020-02-21 MED ORDER — GABAPENTIN 300 MG PO CAPS: 300.0000 mg | ORAL_CAPSULE | Freq: Every evening | ORAL | Status: DC | PRN

## 2020-02-21 MED ORDER — SODIUM CHLORIDE 0.9 % FOR CRRT
INTRAVENOUS_CENTRAL | Status: DC | PRN
  Filled 2020-02-21: qty 1000

## 2020-02-21 MED ORDER — FENTANYL 2500MCG IN NS 250ML (10MCG/ML) PREMIX INFUSION
25.0000 ug/h | INTRAVENOUS | Status: DC
  Administered 2020-02-21: 175 ug/h via INTRAVENOUS
  Administered 2020-02-21: 25 ug/h via INTRAVENOUS
  Administered 2020-02-22: 125 ug/h via INTRAVENOUS
  Administered 2020-02-23: 150 ug/h via INTRAVENOUS
  Administered 2020-02-24 – 2020-02-26 (×2): 200 ug/h via INTRAVENOUS
  Filled 2020-02-21 (×7): qty 250

## 2020-02-21 MED ORDER — MIDAZOLAM HCL 2 MG/2ML IJ SOLN: 1.0000 mg | INTRAMUSCULAR | Status: DC | PRN

## 2020-02-21 MED ORDER — ALTEPLASE 2 MG IJ SOLR
2.0000 mg | Freq: Once | INTRAMUSCULAR | Status: AC | PRN
  Administered 2020-02-21: 2 mg

## 2020-02-21 MED ORDER — FENTANYL CITRATE (PF) 100 MCG/2ML IJ SOLN
100.0000 ug | Freq: Once | INTRAMUSCULAR | Status: AC
  Administered 2020-02-21: 100 ug via INTRAVENOUS

## 2020-02-21 MED ORDER — ORAL CARE MOUTH RINSE
15.0000 mL | OROMUCOSAL | Status: DC
  Administered 2020-02-21 – 2020-02-26 (×52): 15 mL via OROMUCOSAL

## 2020-02-21 MED ORDER — HEPARIN BOLUS VIA INFUSION
4000.0000 [IU] | Freq: Once | INTRAVENOUS | Status: AC
  Administered 2020-02-21: 4000 [IU] via INTRAVENOUS
  Filled 2020-02-21: qty 4000

## 2020-02-21 MED ORDER — STERILE WATER FOR INJECTION IJ SOLN
INTRAMUSCULAR | Status: AC
  Filled 2020-02-21: qty 10

## 2020-02-21 MED ORDER — PROPOFOL 1000 MG/100ML IV EMUL
5.0000 ug/kg/min | INTRAVENOUS | Status: DC
  Administered 2020-02-21 (×2): 30 ug/kg/min via INTRAVENOUS
  Administered 2020-02-21: 35 ug/kg/min via INTRAVENOUS
  Administered 2020-02-21: 5 ug/kg/min via INTRAVENOUS
  Administered 2020-02-21: 30 ug/kg/min via INTRAVENOUS
  Administered 2020-02-22: 20 ug/kg/min via INTRAVENOUS
  Administered 2020-02-22: 30 ug/kg/min via INTRAVENOUS
  Administered 2020-02-22: 25 ug/kg/min via INTRAVENOUS
  Administered 2020-02-22: 30 ug/kg/min via INTRAVENOUS
  Administered 2020-02-23 (×2): 15 ug/kg/min via INTRAVENOUS
  Administered 2020-02-23: 20 ug/kg/min via INTRAVENOUS
  Administered 2020-02-24: 15 ug/kg/min via INTRAVENOUS
  Filled 2020-02-21 (×13): qty 100

## 2020-02-21 MED ORDER — GABAPENTIN 300 MG PO CAPS: 600.0000 mg | ORAL_CAPSULE | Freq: Every evening | ORAL | Status: DC | PRN

## 2020-02-21 MED ORDER — FENTANYL CITRATE (PF) 100 MCG/2ML IJ SOLN
25.0000 ug | Freq: Once | INTRAMUSCULAR | Status: DC
Start: 2020-02-21 — End: 2020-02-26
  Filled 2020-02-21: qty 2

## 2020-02-21 MED ORDER — GUAIFENESIN-DM 100-10 MG/5ML PO SYRP: 10.0000 mL | ORAL_SOLUTION | ORAL | Status: DC | PRN

## 2020-02-21 MED ORDER — MIDAZOLAM HCL (PF) 5 MG/ML IJ SOLN
4.0000 mg | Freq: Once | INTRAMUSCULAR | Status: AC
  Administered 2020-02-21: 4 mg via INTRAVENOUS

## 2020-02-21 MED ORDER — VECURONIUM BROMIDE 10 MG IV SOLR: 10.0000 mg | Freq: Once | INTRAVENOUS | Status: AC

## 2020-02-21 MED ORDER — ASPIRIN 81 MG PO CHEW
81.0000 mg | CHEWABLE_TABLET | Freq: Every day | ORAL | Status: DC
  Administered 2020-02-21 – 2020-02-26 (×6): 81 mg
  Filled 2020-02-21 (×6): qty 1

## 2020-02-21 MED ORDER — HEPARIN SODIUM (PORCINE) 1000 UNIT/ML IJ SOLN
1000.0000 [IU] | Freq: Two times a day (BID) | INTRAMUSCULAR | Status: DC | PRN
  Administered 2020-02-21: 1000 [IU] via INTRAVENOUS
  Filled 2020-02-21 (×2): qty 6

## 2020-02-21 MED ORDER — VECURONIUM BROMIDE 10 MG IV SOLR: 20.0000 mg | Freq: Once | INTRAVENOUS | Status: AC

## 2020-02-21 MED ORDER — NOREPINEPHRINE 16 MG/250ML-% IV SOLN
0.0000 ug/min | INTRAVENOUS | Status: DC
  Administered 2020-02-21: 2 ug/min via INTRAVENOUS
  Administered 2020-02-22: 7 ug/min via INTRAVENOUS
  Administered 2020-02-24: 2 ug/min via INTRAVENOUS
  Filled 2020-02-21 (×4): qty 250

## 2020-02-21 MED ORDER — FENTANYL BOLUS VIA INFUSION
25.0000 ug | INTRAVENOUS | Status: DC | PRN
  Filled 2020-02-21: qty 25

## 2020-02-21 MED ORDER — METOCLOPRAMIDE HCL 5 MG/ML IJ SOLN
5.0000 mg | Freq: Three times a day (TID) | INTRAMUSCULAR | Status: DC
  Administered 2020-02-21 – 2020-02-26 (×14): 5 mg via INTRAVENOUS
  Filled 2020-02-21 (×14): qty 2

## 2020-02-21 MED ORDER — PRISMASOL BGK 0/2.5 32-2.5 MEQ/L EC SOLN
Status: DC
  Filled 2020-02-21 (×10): qty 5000

## 2020-02-21 MED ORDER — ETOMIDATE 2 MG/ML IV SOLN: 20.0000 mg | Freq: Once | INTRAVENOUS | Status: AC

## 2020-02-21 MED ORDER — ATORVASTATIN CALCIUM 20 MG PO TABS
80.0000 mg | ORAL_TABLET | Freq: Every day | ORAL | Status: DC
  Administered 2020-02-21 – 2020-02-25 (×4): 80 mg
  Filled 2020-02-21 (×5): qty 4

## 2020-02-21 MED ORDER — HEPARIN (PORCINE) 25000 UT/250ML-% IV SOLN
1400.0000 [IU]/h | INTRAVENOUS | Status: DC
  Administered 2020-02-21: 1400 [IU]/h via INTRAVENOUS
  Filled 2020-02-21: qty 250

## 2020-02-21 MED ORDER — HYDROCOD POLST-CPM POLST ER 10-8 MG/5ML PO SUER: 5.0000 mL | Freq: Two times a day (BID) | ORAL | Status: DC | PRN

## 2020-02-21 MED ORDER — FENTANYL CITRATE (PF) 100 MCG/2ML IJ SOLN: 200.0000 ug | Freq: Once | INTRAMUSCULAR | Status: AC

## 2020-02-21 MED ORDER — POLYETHYLENE GLYCOL 3350 17 G PO PACK
17.0000 g | PACK | Freq: Every day | ORAL | Status: DC
  Administered 2020-02-21 – 2020-02-26 (×6): 17 g
  Filled 2020-02-21 (×6): qty 1

## 2020-02-21 MED ORDER — NOREPINEPHRINE 4 MG/250ML-% IV SOLN: 0.0000 ug/min | INTRAVENOUS | Status: DC

## 2020-02-21 MED ORDER — FOLIC ACID 1 MG PO TABS
1.0000 mg | ORAL_TABLET | Freq: Every day | ORAL | Status: DC
  Administered 2020-02-22 – 2020-02-26 (×5): 1 mg
  Filled 2020-02-21 (×5): qty 1

## 2020-02-21 MED ORDER — VECURONIUM BROMIDE 10 MG IV SOLR
INTRAVENOUS | Status: AC
  Administered 2020-02-21: 20 mg via INTRAVENOUS
  Filled 2020-02-21: qty 20

## 2020-02-21 MED ORDER — ASCORBIC ACID 500 MG PO TABS
500.0000 mg | ORAL_TABLET | Freq: Every day | ORAL | Status: DC
  Administered 2020-02-22 – 2020-02-26 (×5): 500 mg
  Filled 2020-02-21 (×5): qty 1

## 2020-02-21 MED ORDER — DOCUSATE SODIUM 50 MG/5ML PO LIQD
100.0000 mg | Freq: Two times a day (BID) | ORAL | Status: DC
  Administered 2020-02-21 – 2020-02-26 (×10): 100 mg
  Filled 2020-02-21 (×10): qty 10

## 2020-02-21 MED ORDER — INSULIN ASPART 100 UNIT/ML ~~LOC~~ SOLN
0.0000 [IU] | SUBCUTANEOUS | Status: DC
  Administered 2020-02-21: 4 [IU] via SUBCUTANEOUS
  Administered 2020-02-21 – 2020-02-22 (×2): 3 [IU] via SUBCUTANEOUS
  Administered 2020-02-22: 4 [IU] via SUBCUTANEOUS
  Administered 2020-02-22 (×2): 3 [IU] via SUBCUTANEOUS
  Administered 2020-02-22 – 2020-02-23 (×3): 4 [IU] via SUBCUTANEOUS
  Administered 2020-02-23 (×3): 3 [IU] via SUBCUTANEOUS
  Administered 2020-02-24: 11 [IU] via SUBCUTANEOUS
  Administered 2020-02-24: 15 [IU] via SUBCUTANEOUS
  Administered 2020-02-24: 11 [IU] via SUBCUTANEOUS
  Administered 2020-02-24 (×2): 4 [IU] via SUBCUTANEOUS
  Administered 2020-02-24: 15 [IU] via SUBCUTANEOUS
  Administered 2020-02-24: 11 [IU] via SUBCUTANEOUS
  Administered 2020-02-25: 7 [IU] via SUBCUTANEOUS
  Administered 2020-02-25 (×2): 15 [IU] via SUBCUTANEOUS
  Administered 2020-02-25: 7 [IU] via SUBCUTANEOUS
  Administered 2020-02-25: 11 [IU] via SUBCUTANEOUS
  Administered 2020-02-26: 7 [IU] via SUBCUTANEOUS
  Administered 2020-02-26: 4 [IU] via SUBCUTANEOUS
  Administered 2020-02-26: 11 [IU] via SUBCUTANEOUS
  Filled 2020-02-21 (×28): qty 1

## 2020-02-21 MED ORDER — HEPARIN SODIUM (PORCINE) 1000 UNIT/ML DIALYSIS
1000.0000 [IU] | INTRAMUSCULAR | Status: DC | PRN
  Administered 2020-02-21: 2400 [IU] via INTRAVENOUS_CENTRAL
  Administered 2020-02-22: 2800 [IU] via INTRAVENOUS_CENTRAL
  Administered 2020-02-25: 2000 [IU] via INTRAVENOUS_CENTRAL
  Filled 2020-02-21 (×6): qty 6

## 2020-02-21 MED ORDER — CHLORHEXIDINE GLUCONATE 0.12% ORAL RINSE (MEDLINE KIT)
15.0000 mL | Freq: Two times a day (BID) | OROMUCOSAL | Status: DC
  Administered 2020-02-21 – 2020-02-26 (×11): 15 mL via OROMUCOSAL

## 2020-02-21 MED ORDER — THIAMINE HCL 100 MG PO TABS
100.0000 mg | ORAL_TABLET | Freq: Every day | ORAL | Status: DC
  Administered 2020-02-22 – 2020-02-26 (×5): 100 mg
  Filled 2020-02-21 (×5): qty 1

## 2020-02-21 MED ORDER — PRISMASOL BGK 0/2.5 32-2.5 MEQ/L EC SOLN
Status: DC
  Filled 2020-02-21 (×47): qty 5000

## 2020-02-21 MED ORDER — ACETAMINOPHEN 325 MG PO TABS: 650.0000 mg | ORAL_TABLET | Freq: Four times a day (QID) | ORAL | Status: DC | PRN

## 2020-02-21 MED ORDER — ZINC SULFATE 220 (50 ZN) MG PO CAPS
220.0000 mg | ORAL_CAPSULE | Freq: Every day | ORAL | Status: DC
  Administered 2020-02-22 – 2020-02-26 (×5): 220 mg
  Filled 2020-02-21 (×5): qty 1

## 2020-02-21 MED ORDER — ALTEPLASE 2 MG IJ SOLR: 2.0000 mg | Freq: Once | INTRAMUSCULAR | Status: AC

## 2020-02-21 MED ORDER — ADULT MULTIVITAMIN W/MINERALS CH
1.0000 | ORAL_TABLET | Freq: Every day | ORAL | Status: DC
  Administered 2020-02-22 – 2020-02-26 (×5): 1
  Filled 2020-02-21 (×5): qty 1

## 2020-02-21 NOTE — Procedures (Addendum)
Central Venous Catheter Insertion Procedure Note  Bobby Lindsey  [id number]  1955/10/09  Date:02/21/20  Time:12:36 AM   Provider Performing:Britton L Rust-Chester   Procedure: Insertion of Non-tunneled Central Venous Catheter(36556)with US guidance (75916)    Indication(s) Hemodialysis  Consent Risks of the procedure as well as the alternatives and risks of each were explained to the patient and/or caregiver.  Consent for the procedure was obtained and is signed in the bedside chart  Anesthesia Topical only with 1% lidocaine   Timeout Verified patient identification, verified procedure, site/side was marked, verified correct patient position, special equipment/implants available, medications/allergies/relevant history reviewed, required imaging and test results available.  Sterile Technique Maximal sterile technique including full sterile barrier drape, hand hygiene, sterile gown, sterile gloves, mask, hair covering, sterile ultrasound probe cover (if used).  Procedure Description Area of catheter insertion was cleaned with chlorhexidine and draped in sterile fashion.   With real-time ultrasound guidance a HD catheter was placed into the right internal jugular vein.  Nonpulsatile blood flow and easy flushing noted in all ports.  The catheter was sutured in place and sterile dressing applied.  Complications/Tolerance None; patient tolerated the procedure well. Chest X-ray is ordered to verify placement for internal jugular or subclavian cannulation.  Chest x-ray is not ordered for femoral cannulation.  EBL Minimal  Specimen(s) None   HD dialysis catheter was seen on the chest x-ray "deep within the right atrium", HD cath was retracted with additional chest x-ray verifying placement.  No complications.   Domingo Pulse Rust-Chester, AGACNP-BC Acute Care Nurse Practitioner [facility]   [phone] / [phone] Please see Amion for pager details.

## 2020-02-21 NOTE — Progress Notes (Signed)
Progress Note  Patient Name: Bobby Lindsey Date of Encounter: 02/21/2020  [facility] Cardiologist: Rollene Rotunda, MD   Subjective   Patient intubated overnight.  Inpatient Medications    Scheduled Meds: . [START ON 02/22/2020] vitamin C  500 mg Per Tube Daily  . aspirin  81 mg Per Tube Daily  . atorvastatin  80 mg Per Tube Daily  . chlorhexidine gluconate (MEDLINE KIT)  15 mL Mouth Rinse BID  . Chlorhexidine Gluconate Cloth  6 each Topical Daily  . docusate  100 mg Per Tube BID  . fentaNYL (SUBLIMAZE) injection  25 mcg Intravenous Once  . [START ON 02/22/2020] folic acid  1 mg Per Tube Daily  . insulin aspart  0-20 Units Subcutaneous Q4H  . insulin glargine  20 Units Subcutaneous QHS  . isosorbide mononitrate  30 mg Oral Daily  . mouth rinse  15 mL Mouth Rinse 10 times per day  . methylPREDNISolone (SOLU-MEDROL) injection  20 mg Intravenous Daily  . metoCLOPramide (REGLAN) injection  5 mg Intravenous Q8H  . midazolam      . [START ON 02/22/2020] multivitamin with minerals  1 tablet Per Tube Daily  . pantoprazole (PROTONIX) IV  40 mg Intravenous Q12H  . polyethylene glycol  17 g Per Tube Daily  . [START ON 02/22/2020] thiamine  100 mg Per Tube Daily  . [START ON 02/22/2020] zinc sulfate  220 mg Per Tube Daily   Continuous Infusions: . sodium chloride Stopped (03-11-20 2116)  . sodium chloride Stopped (02/20/20 1126)  . fentaNYL infusion INTRAVENOUS 50 mcg/hr (02/21/20 0600)  . norepinephrine (LEVOPHED) Adult infusion 2 mcg/min (02/21/20 0600)  . prismasol BGK 2/2.5 dialysis solution    . prismasol BGK 2/2.5 replacement solution    . prismasol BGK 2/2.5 replacement solution    . propofol (DIPRIVAN) infusion 30 mcg/kg/min (02/21/20 1020)  . remdesivir 100 mg in NS 100 mL     PRN Meds: acetaminophen, chlorpheniramine-HYDROcodone, fentaNYL, fluticasone, gabapentin, guaiFENesin-dextromethorphan, heparin, midazolam, midazolam, morphine injection, sodium chloride   Vital Signs     Vitals:   02/21/20 0630 02/21/20 0700 02/21/20 0715 02/21/20 0859  BP: (!) 82/51 (!) 97/55 97/62   Pulse: 77 76 77   Resp: 10 12 10    Temp: (!) 96.62 F (35.9 C) (!) 96.8 F (36 C) (!) 96.98 F (36.1 C)   TempSrc:      SpO2: 96% 100% 98% 98%  Weight:      Height:        Intake/Output Summary (Last 24 hours) at 02/21/2020 1130 Last data filed at 02/21/2020 0600 Gross per 24 hour  Intake 232.5 ml  Output 150 ml  Net 82.5 ml   Last 3 Weights 02/21/2020 02/20/2020 02/19/2020  Weight (lbs) 219 lb 5.7 oz 241 lb 6.5 oz 243 lb 6.2 oz  Weight (kg) 99.5 kg 109.5 kg 110.4 kg      Telemetry    Sinus tach - Personally Reviewed  ECG    NA - Personally Reviewed  Physical Exam   GEN: No acute distress.   Neck: No JVD Cardiac: RRR, no murmurs, rubs, or gallops.  Respiratory: Clear to auscultation bilaterally. GI: Soft, nontender, non-distended  MS: No edema; No deformity. Neuro:  Intubated and sedated Psych:  Intubated and sedated  Labs    High Sensitivity Troponin:   Recent Labs  Lab 02/11/20 1810 02/11/20 2055 03-11-20 1713 03-11-20 2028  TROPONINIHS 20* 20* 4,704* 5,901*      Chemistry Recent Labs  Lab  02/19/20 0228 02/20/20 0539 02/21/20 0230  NA 132* 127* 130*  K 4.7 5.3* 5.6*  CL 95* 92* 92*  CO2 25 20* 22  GLUCOSE 178* 343* 148*  BUN 96* 121* 140*  CREATININE 2.37* 4.86* 6.89*  CALCIUM 8.1* 8.0* 8.3*  PROT 6.8 6.5 7.5  ALBUMIN 2.9* 2.6* 2.9*  AST 77* 55* 68*  ALT 36 33 38  ALKPHOS 57 55 84  BILITOT 1.1 0.9 1.2  GFRNONAA 30* 13* 8*  ANIONGAP 12 15 16*     Hematology Recent Labs  Lab 02/19/20 0228 02/20/20 0539 02/21/20 0230  WBC 5.8 8.5 16.5*  RBC 5.58 5.37 5.90*  HGB 15.7 15.2 16.3  HCT 46.6 44.8 50.1  MCV 83.5 83.4 84.9  MCH 28.1 28.3 27.6  MCHC 33.7 33.9 32.5  RDW 15.1 14.9 15.1  PLT 114* 118* 197    BNPNo results for input(s): BNP, PROBNP in the last 168 hours.   DDimer  Recent Labs  Lab 03/08/2020 2028 02/19/20 0228  02/20/20 0539  DDIMER 0.92* 0.72* 0.78*     Radiology    DG Chest 1 View  Result Date: 02/21/2020 CLINICAL DATA:  Initial evaluation for central line placement. EXAM: CHEST  1 VIEW COMPARISON:  Prior radiograph from earlier the same day. FINDINGS: The right IJ approach central venous catheter has been retracted, with tip now overlying the proximal right atrium. Enteric tube remains in place, tip well above the carina. Enteric tube courses into the abdomen. Stable cardiomegaly. Mediastinal silhouette normal. Diffuse related pad overlies the left chest. Lungs hypoinflated. Extensive bilateral airspace disease is little interval change from previous. Small left pleural effusion. No pneumothorax. Osseous structures are unchanged. IMPRESSION: 1. Interval retraction of right IJ approach central venous catheter, with tip now overlying the proximal right atrium. 2. Otherwise stable appearance of the chest with extensive bilateral airspace disease and small left pleural effusion. Electronically Signed   By: Jeannine Boga M.D.   On: 02/21/2020 02:52   DG Abd 1 View  Result Date: 02/21/2020 CLINICAL DATA:  Orogastric tube placement. EXAM: ABDOMEN - 1 VIEW COMPARISON:  None. FINDINGS: Tip and side port of the enteric tube below the diaphragm in the stomach. Air throughout nondilated large and small bowel. Small volume of colonic stool. IMPRESSION: Tip and side port of the enteric tube below the diaphragm in the stomach. Electronically Signed   By: Keith Rake M.D.   On: 02/21/2020 01:46   DG Chest Port 1 View  Result Date: 02/21/2020 CLINICAL DATA:  Central line placement, acute respiratory failure EXAM: PORTABLE CHEST 1 VIEW COMPARISON:  Radiograph 02/19/2019, CT 06/17/2018 FINDINGS: Right IJ catheter tip terminates deep quite deep within the right atrium (see annotated image). The endotracheal tube terminates in the mid trachea, 5.5 cm from the carina. Transesophageal tube tip and side port terminate  distal to the GE junction. Telemetry leads and external support devices overlie the chest. Some interval clearing in the left lung base may reflect improved aeration though there is increasingly coalescent opacity and air bronchograms in the right mid lung and retrocardiac space. No pneumothorax. Suspect small bilateral effusions. Marked cardiomegaly is similar to prior though portions of the cardiac silhouette are silhouetted by overlying opacity. 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 IMPRESSION: 1. Right IJ catheter tip terminates deep quite deep within the right atrium. Recommend retraction approximately 9 cm to position towards the superior cavoatrial junction with reimaging to confirm placement. 2. Satisfactory positioning of the endotracheal and transesophageal tubes. 3.  02/19/20 0228 02/20/20 0539 02/21/20 0230  NA 132* 127* 130*  K 4.7 5.3* 5.6*  CL 95* 92* 92*  CO2 25 20* 22  GLUCOSE 178* 343* 148*  BUN 96* 121* 140*  CREATININE 2.37* 4.86* 6.89*  CALCIUM 8.1* 8.0* 8.3*  PROT 6.8 6.5 7.5  ALBUMIN 2.9* 2.6* 2.9*  AST 77* 55* 68*  ALT 36 33 38  ALKPHOS 57 55 84  BILITOT 1.1 0.9 1.2  GFRNONAA 30* 13* 8*  ANIONGAP 12 15 16*     Hematology Recent Labs  Lab 02/19/20 0228 02/20/20 0539 02/21/20 0230  WBC 5.8 8.5 16.5*  RBC 5.58 5.37 5.90*  HGB 15.7 15.2 16.3  HCT 46.6 44.8 50.1  MCV 83.5 83.4 84.9  MCH 28.1 28.3 27.6  MCHC 33.7 33.9 32.5  RDW 15.1 14.9 15.1  PLT 114* 118* 197    BNPNo results for input(s): BNP, PROBNP in the last 168 hours.   DDimer  Recent Labs  Lab 03/08/2020 2028 02/19/20 0228  02/20/20 0539  DDIMER 0.92* 0.72* 0.78*     Radiology    DG Chest 1 View  Result Date: 02/21/2020 CLINICAL DATA:  Initial evaluation for central line placement. EXAM: CHEST  1 VIEW COMPARISON:  Prior radiograph from earlier the same day. FINDINGS: The right IJ approach central venous catheter has been retracted, with tip now overlying the proximal right atrium. Enteric tube remains in place, tip well above the carina. Enteric tube courses into the abdomen. Stable cardiomegaly. Mediastinal silhouette normal. Diffuse related pad overlies the left chest. Lungs hypoinflated. Extensive bilateral airspace disease is little interval change from previous. Small left pleural effusion. No pneumothorax. Osseous structures are unchanged. IMPRESSION: 1. Interval retraction of right IJ approach central venous catheter, with tip now overlying the proximal right atrium. 2. Otherwise stable appearance of the chest with extensive bilateral airspace disease and small left pleural effusion. Electronically Signed   By: Jeannine Boga M.D.   On: 02/21/2020 02:52   DG Abd 1 View  Result Date: 02/21/2020 CLINICAL DATA:  Orogastric tube placement. EXAM: ABDOMEN - 1 VIEW COMPARISON:  None. FINDINGS: Tip and side port of the enteric tube below the diaphragm in the stomach. Air throughout nondilated large and small bowel. Small volume of colonic stool. IMPRESSION: Tip and side port of the enteric tube below the diaphragm in the stomach. Electronically Signed   By: Keith Rake M.D.   On: 02/21/2020 01:46   DG Chest Port 1 View  Result Date: 02/21/2020 CLINICAL DATA:  Central line placement, acute respiratory failure EXAM: PORTABLE CHEST 1 VIEW COMPARISON:  Radiograph 02/19/2019, CT 06/17/2018 FINDINGS: Right IJ catheter tip terminates deep quite deep within the right atrium (see annotated image). The endotracheal tube terminates in the mid trachea, 5.5 cm from the carina. Transesophageal tube tip and side port terminate  distal to the GE junction. Telemetry leads and external support devices overlie the chest. Some interval clearing in the left lung base may reflect improved aeration though there is increasingly coalescent opacity and air bronchograms in the right mid lung and retrocardiac space. No pneumothorax. Suspect small bilateral effusions. Marked cardiomegaly is similar to prior though portions of the cardiac silhouette are silhouetted by overlying opacity. 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 IMPRESSION: 1. Right IJ catheter tip terminates deep quite deep within the right atrium. Recommend retraction approximately 9 cm to position towards the superior cavoatrial junction with reimaging to confirm placement. 2. Satisfactory positioning of the endotracheal and transesophageal tubes. 3.  02/19/20 0228 02/20/20 0539 02/21/20 0230  NA 132* 127* 130*  K 4.7 5.3* 5.6*  CL 95* 92* 92*  CO2 25 20* 22  GLUCOSE 178* 343* 148*  BUN 96* 121* 140*  CREATININE 2.37* 4.86* 6.89*  CALCIUM 8.1* 8.0* 8.3*  PROT 6.8 6.5 7.5  ALBUMIN 2.9* 2.6* 2.9*  AST 77* 55* 68*  ALT 36 33 38  ALKPHOS 57 55 84  BILITOT 1.1 0.9 1.2  GFRNONAA 30* 13* 8*  ANIONGAP 12 15 16*     Hematology Recent Labs  Lab 02/19/20 0228 02/20/20 0539 02/21/20 0230  WBC 5.8 8.5 16.5*  RBC 5.58 5.37 5.90*  HGB 15.7 15.2 16.3  HCT 46.6 44.8 50.1  MCV 83.5 83.4 84.9  MCH 28.1 28.3 27.6  MCHC 33.7 33.9 32.5  RDW 15.1 14.9 15.1  PLT 114* 118* 197    BNPNo results for input(s): BNP, PROBNP in the last 168 hours.   DDimer  Recent Labs  Lab 03/08/2020 2028 02/19/20 0228  02/20/20 0539  DDIMER 0.92* 0.72* 0.78*     Radiology    DG Chest 1 View  Result Date: 02/21/2020 CLINICAL DATA:  Initial evaluation for central line placement. EXAM: CHEST  1 VIEW COMPARISON:  Prior radiograph from earlier the same day. FINDINGS: The right IJ approach central venous catheter has been retracted, with tip now overlying the proximal right atrium. Enteric tube remains in place, tip well above the carina. Enteric tube courses into the abdomen. Stable cardiomegaly. Mediastinal silhouette normal. Diffuse related pad overlies the left chest. Lungs hypoinflated. Extensive bilateral airspace disease is little interval change from previous. Small left pleural effusion. No pneumothorax. Osseous structures are unchanged. IMPRESSION: 1. Interval retraction of right IJ approach central venous catheter, with tip now overlying the proximal right atrium. 2. Otherwise stable appearance of the chest with extensive bilateral airspace disease and small left pleural effusion. Electronically Signed   By: Jeannine Boga M.D.   On: 02/21/2020 02:52   DG Abd 1 View  Result Date: 02/21/2020 CLINICAL DATA:  Orogastric tube placement. EXAM: ABDOMEN - 1 VIEW COMPARISON:  None. FINDINGS: Tip and side port of the enteric tube below the diaphragm in the stomach. Air throughout nondilated large and small bowel. Small volume of colonic stool. IMPRESSION: Tip and side port of the enteric tube below the diaphragm in the stomach. Electronically Signed   By: Keith Rake M.D.   On: 02/21/2020 01:46   DG Chest Port 1 View  Result Date: 02/21/2020 CLINICAL DATA:  Central line placement, acute respiratory failure EXAM: PORTABLE CHEST 1 VIEW COMPARISON:  Radiograph 02/19/2019, CT 06/17/2018 FINDINGS: Right IJ catheter tip terminates deep quite deep within the right atrium (see annotated image). The endotracheal tube terminates in the mid trachea, 5.5 cm from the carina. Transesophageal tube tip and side port terminate  distal to the GE junction. Telemetry leads and external support devices overlie the chest. Some interval clearing in the left lung base may reflect improved aeration though there is increasingly coalescent opacity and air bronchograms in the right mid lung and retrocardiac space. No pneumothorax. Suspect small bilateral effusions. Marked cardiomegaly is similar to prior though portions of the cardiac silhouette are silhouetted by overlying opacity. 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 IMPRESSION: 1. Right IJ catheter tip terminates deep quite deep within the right atrium. Recommend retraction approximately 9 cm to position towards the superior cavoatrial junction with reimaging to confirm placement. 2. Satisfactory positioning of the endotracheal and transesophageal tubes. 3.

## 2020-02-21 NOTE — Plan of Care (Signed)
Patient was extremely sick yesterday and required intubation, so pulmonary critical care intubated patient and currently patient is on ventilator.  Discussed with the nephrologist regarding CRRT treatment because of renal failure.   We will sign off, please consult [facility] hospitalist when patient is extubated and ready to be transferred. Thank you.

## 2020-02-21 NOTE — Progress Notes (Signed)
Med review for CRRT  Current medications reviewed  Will adjust gabapentin from 600 mg PO qhs prn to 300 mg PO qhs prn and metoclopramide from 10 mg IV q8h to 5 mg IV q8h for CRRT dosing.   Rayna Sexton, PharmD, BCPS Clinical Pharmacist 02/21/2020 10:31 AM

## 2020-02-21 NOTE — Progress Notes (Signed)
CRITICAL CARE NOTE  SYNOPSIS  65 yo male recently diagnosed with COVID-19 pneumonia on 12/30 which he required hospitalization at [facility] 12/30 to 01/3 now admitted with STEMI and acute hypoxic respiratory failure secondary to COVID-19 pneumonia requiring Bipap s/p cardiac catheterization with unsuccessful PCI and stent placement to SVG per cardiology recommending uninterrupted dual antiplatelet therapy    Cardiac cath revealed multiple occlusions, however PCI and stent placement to the SVG unsuccessful per cardiology recommending uninterrupted dual antiplatelet therapy for a minimum of 6 months.    Pt admitted to ICU post procedure due to Bipap requirement secondary to acute hypoxic respiratory failure in the setting of COVID-19 pneumonia.     Significant Hospital Events:  01/5: Pt admitted to ICU post cardiac cath  1/6 TRANSFERRED TO [facility]  1/7 Progressive resp failure and renal failure, Family made aware and reversed CODE status 1/7 patient was emergently intubated and placed on MV suport 1/7 vasc cath placed in anticipation for CRRT   CC  follow up respiratory failure  SUBJECTIVE Patient remains critically ill Prognosis is guarded Multiorgan failure, very poor prognosis Severe end stage ischemic cardiomyopathy and sCHF  BP 97/62   Pulse 77   Temp (!) 96.98 F (36.1 C)   Resp 10   Ht 5\' 11"  (1.803 m)   Wt 99.5 kg   SpO2 98%   BMI 30.59 kg/m    I/O last 3 completed shifts: In: 309.3 [I.V.:309.3] Out: 400 [Urine:400] No intake/output data recorded.  SpO2: 98 % O2 Flow Rate (L/min): 15 L/min FiO2 (%): 100 %  Estimated body mass index is 30.59 kg/m as calculated from the following:   Height as of this encounter: 5\' 11"  (1.803 m).   Weight as of this encounter: 99.5 kg.  SIGNIFICANT EVENTS   REVIEW OF SYSTEMS  PATIENT IS UNABLE TO PROVIDE COMPLETE REVIEW OF SYSTEMS DUE TO SEVERE CRITICAL ILLNESS      COVID-19 DISASTER DECLARATION:   FULL CONTACT  PHYSICAL EXAMINATION WAS NOT POSSIBLE DUE TO TREATMENT OF COVID-19  AND CONSERVATION OF PERSONAL PROTECTIVE EQUIPMENT, LIMITED EXAM FINDINGS INCLUDE-   PHYSICAL EXAMINATION:  GENERAL:critically ill appearing, +resp distress NEUROLOGIC: obtunded, GCS<8   Patient assessed or the symptoms described in the history of present illness.  In the context of the Global COVID-19 pandemic, which necessitated consideration that the patient might be at risk for infection with the SARS-CoV-2 virus that causes COVID-19, Institutional protocols and algorithms that pertain to the evaluation of patients at risk for COVID-19 are in a state of rapid change based on information released by regulatory bodies including the CDC and federal and state organizations. These policies and algorithms were followed during the patient's care while in hospital.    MEDICATIONS: I have reviewed all medications and confirmed regimen as documented   CULTURE RESULTS   Recent Results (from the past 240 hour(s))  Resp Panel by RT-PCR (Flu A&B, Covid) Nasopharyngeal Swab     Status: Abnormal   Collection Time: 02/12/20  6:40 AM   Specimen: Nasopharyngeal Swab; Nasopharyngeal(NP) swabs in vial transport medium  Result Value Ref Range Status   SARS Coronavirus 2 by RT PCR POSITIVE (A) NEGATIVE Final    Comment: RESULT CALLED TO, READ BACK BY AND VERIFIED WITH: J. THURMAN RN, AT 0800 02/12/20 D. VANHOOK (NOTE) SARS-CoV-2 target nucleic acids are DETECTED.  The SARS-CoV-2 RNA is generally detectable in upper respiratory specimens during the acute phase of infection. Positive results are indicative of the presence of the identified virus,  but do not rule out bacterial infection or co-infection with other pathogens not detected by the test. Clinical correlation with patient history and other diagnostic information is necessary to determine patient infection status. The expected result is Negative.  Fact Sheet for  Patients: EntrepreneurPulse.com.au  Fact Sheet for Healthcare Providers: IncredibleEmployment.be  This test is not yet approved or cleared by the [place] FDA and  has been authorized for detection and/or diagnosis of SARS-CoV-2 by FDA under an Emergency Use Authorization (EUA).  This EUA will remain in effect (meaning this te st can be used) for the duration of  the COVID-19 declaration under Section 564(b)(1) of the Act, 21 U.S.C. section 360bbb-3(b)(1), unless the authorization is terminated or revoked sooner.     Influenza A by PCR NEGATIVE NEGATIVE Final   Influenza B by PCR NEGATIVE NEGATIVE Final    Comment: (NOTE) The Xpert Xpress SARS-CoV-2/FLU/RSV plus assay is intended as an aid in the diagnosis of influenza from Nasopharyngeal swab specimens and should not be used as a sole basis for treatment. Nasal washings and aspirates are unacceptable for Xpert Xpress SARS-CoV-2/FLU/RSV testing.  Fact Sheet for Patients: EntrepreneurPulse.com.au  Fact Sheet for Healthcare Providers: IncredibleEmployment.be  This test is not yet approved or cleared by the [place] FDA and has been authorized for detection and/or diagnosis of SARS-CoV-2 by FDA under an Emergency Use Authorization (EUA). This EUA will remain in effect (meaning this test can be used) for the duration of the COVID-19 declaration under Section 564(b)(1) of the Act, 21 U.S.C. section 360bbb-3(b)(1), unless the authorization is terminated or revoked.  Performed at [facility], [place] [address], [place], [place] [address]   Blood Culture (routine x 2)     Status: None   Collection Time: 02/12/20  8:15 AM   Specimen: BLOOD RIGHT FOREARM  Result Value Ref Range Status   Specimen Description BLOOD RIGHT FOREARM  Final   Special Requests   Final    BOTTLES DRAWN AEROBIC AND ANAEROBIC Blood Culture adequate volume   Culture   Final     NO GROWTH 5 DAYS Performed at [facility], [place] [address] [place], [place] [address]    Report Status 02/17/2020 FINAL  Final  Blood Culture (routine x 2)     Status: None   Collection Time: 02/12/20  9:07 AM   Specimen: BLOOD RIGHT ARM  Result Value Ref Range Status   Specimen Description BLOOD RIGHT ARM  Final   Special Requests   Final    BOTTLES DRAWN AEROBIC AND ANAEROBIC Blood Culture adequate volume   Culture   Final    NO GROWTH 5 DAYS Performed at [facility], [place] [address], [place], [place] [address]    Report Status 02/17/2020 FINAL  Final  Culture, blood (Routine X 2) w Reflex to ID Panel     Status: None (Preliminary result)   Collection Time: 02/15/2020  8:28 PM   Specimen: BLOOD  Result Value Ref Range Status   Specimen Description BLOOD RIGHT ANTECUBITAL  Final   Special Requests   Final    BOTTLES DRAWN AEROBIC AND ANAEROBIC Blood Culture results may not be optimal due to an excessive volume of blood received in culture bottles   Culture   Final    NO GROWTH 3 DAYS Performed at [facility], [address] [place], [place] [address]    Report Status PENDING  Incomplete  Culture, blood (Routine X 2) w Reflex to  ID Panel     Status: None (Preliminary result)   Collection Time: 02/25/2020  8:30 PM   Specimen: BLOOD  Result Value Ref Range Status   Specimen Description BLOOD BLOOD RIGHT HAND  Final   Special Requests   Final    BOTTLES DRAWN AEROBIC AND ANAEROBIC Blood Culture adequate volume   Culture   Final    NO GROWTH 3 DAYS Performed at [facility], [address] [place], [place] [address]    Report Status PENDING  Incomplete  MRSA PCR Screening     Status: None   Collection Time: 03/12/2020 11:42 PM   Specimen: Nasopharyngeal  Result Value Ref Range Status   MRSA by PCR NEGATIVE NEGATIVE Final    Comment:        The GeneXpert MRSA Assay (FDA approved for NASAL specimens only), is one component of  a comprehensive MRSA colonization surveillance program. It is not intended to diagnose MRSA infection nor to guide or monitor treatment for MRSA infections. Performed at [facility], [address] [place], [place] [address]           IMAGING    DG Chest 1 View  Result Date: 02/21/2020 CLINICAL DATA:  Initial evaluation for central line placement. EXAM: CHEST  1 VIEW COMPARISON:  Prior radiograph from earlier the same day. FINDINGS: The right IJ approach central venous catheter has been retracted, with tip now overlying the proximal right atrium. Enteric tube remains in place, tip well above the carina. Enteric tube courses into the abdomen. Stable cardiomegaly. Mediastinal silhouette normal. Diffuse related pad overlies the left chest. Lungs hypoinflated. Extensive bilateral airspace disease is little interval change from previous. Small left pleural effusion. No pneumothorax. Osseous structures are unchanged. IMPRESSION: 1. Interval retraction of right IJ approach central venous catheter, with tip now overlying the proximal right atrium. 2. Otherwise stable appearance of the chest with extensive bilateral airspace disease and small left pleural effusion. Electronically Signed   By: Jeannine Boga M.D.   On: 02/21/2020 02:52   DG Abd 1 View  Result Date: 02/21/2020 CLINICAL DATA:  Orogastric tube placement. EXAM: ABDOMEN - 1 VIEW COMPARISON:  None. FINDINGS: Tip and side port of the enteric tube below the diaphragm in the stomach. Air throughout nondilated large and small bowel. Small volume of colonic stool. IMPRESSION: Tip and side port of the enteric tube below the diaphragm in the stomach. Electronically Signed   By: Keith Rake M.D.   On: 02/21/2020 01:46   DG Chest Port 1 View  Result Date: 02/21/2020 CLINICAL DATA:  Central line placement, acute respiratory failure EXAM: PORTABLE CHEST 1 VIEW COMPARISON:  Radiograph 02/19/2019, CT 06/17/2018 FINDINGS: Right IJ  catheter tip terminates deep quite deep within the right atrium (see annotated image). The endotracheal tube terminates in the mid trachea, 5.5 cm from the carina. Transesophageal tube tip and side port terminate distal to the GE junction. Telemetry leads and external support devices overlie the chest. Some interval clearing in the left lung base may reflect improved aeration though there is increasingly coalescent opacity and air bronchograms in the right mid lung and retrocardiac space. No pneumothorax. Suspect small bilateral effusions. Marked cardiomegaly is similar to prior though portions of the cardiac silhouette are silhouetted by overlying opacity. 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 IMPRESSION: 1. Right IJ catheter tip terminates deep quite deep within the right atrium. Recommend retraction approximately 9 cm to position towards the superior cavoatrial junction with reimaging to  confirm placement. 2. Satisfactory positioning of the endotracheal and transesophageal tubes. 3. Improved aeration of the left lung base but with increasingly coalescent opacity and air bronchograms in the right mid lung and retrocardiac space could reflect worsening airspace disease and/or edema. 4. Suspect small bilateral effusions. 5. Marked cardiomegaly. Electronically Signed   By: Lovena Le M.D.   On: 02/21/2020 01:47     Nutrition Status:           Indwelling Urinary Catheter continued, requirement due to   Reason to continue Indwelling Urinary Catheter strict Intake/Output monitoring for hemodynamic instability   Central Line/ continued, requirement due to  Reason to continue [place] of central venous pressure or other hemodynamic parameters and poor IV access   Ventilator continued, requirement due to severe respiratory failure   Ventilator Sedation RASS 0 to -2      ASSESSMENT AND PLAN SYNOPSIS  Acute hypoxic respiratory failure secondary to COVID-19 pneumonia   With Multiorgan failure, very poor prognosis Severe end stage ischemic cardiomyopathy and sCHF complicated by acute rena failure   Acute hypoxemic respiratory failure due to COVID-19 pneumonia / ARDS Mechanical ventilation via ARDS protocol, target PRVC 6 cc/kg Wean PEEP and FiO2 as able Goal plateau pressure less than 30, driving pressure less than 15 Paralytics if necessary for vent synchrony, gas exchange Cycle prone positioning if necessary for oxygenation diuresis as tolerated based on Kidney function VAP prevention order set Remdesivir PATEINTS WIFE ASKED TO TAKE AWAY, THEY HAVE REFUSED THIS THERAPY IV STEROIDS  Follow CRP   Severe ACUTE Hypoxic and Hypercapnic Respiratory Failure -continue Full MV support -continue Bronchodilator Therapy -Wean Fio2 and PEEP as tolerated -VAP/VENT bundle implementation  ACUTE SYSTOLIC CARDIAC FAILURE- EF 40% Severe end stage ischemic cardiomyopathy Follow up cardiology recs   Obesity, possible OSA.   Will certainly impact respiratory mechanics   ACUTE KIDNEY INJURY/Renal Failure -continue Foley Catheter-assess need -Avoid nephrotoxic agents -Follow urine output, BMP -Ensure adequate renal perfusion, optimize oxygenation -Renal dose medications HD or CRRT to be started     NEUROLOGY Acute toxic metabolic encephalopathy, need for sedation Goal RASS -2 to -3  SHOCK-SEPSIS/CARDIOGENIC COVID pneumonia and cardiogenic shock Pressors as needed -use vasopressors to keep MAP>65 -follow ABG and LA -follow up cultures -emperic ABX  CARDIAC ICU monitoring  ID -continue IV abx as prescibed -follow up cultures  GI GI PROPHYLAXIS as indicated   DIET-->TF's as tolerated Constipation protocol as indicated  ENDO - will use ICU hypoglycemic\Hyperglycemia protocol if indicated     ELECTROLYTES -follow labs as needed -replace as needed -pharmacy consultation and following   DVT/GI PRX ordered and assessed TRANSFUSIONS  AS NEEDED MONITOR FSBS I Assessed the need for Labs I Assessed the need for Foley I Assessed the need for Central Venous Line Family Discussion when available I Assessed the need for Mobilization I made an Assessment of medications to be adjusted accordingly Safety Risk assessment completed   CASE DISCUSSED IN MULTIDISCIPLINARY ROUNDS WITH ICU TEAM  Critical Care Time devoted to patient care services described in this note is 56 minutes.   Overall, patient is critically ill, prognosis is guarded.  Patient with Multiorgan failure and at high risk for cardiac arrest and death.   Recommend DNR status SOFA SCORE predicts >95% mortality  Corrin Parker, M.D.  [facility]  Medical Director [place] Director [facility] Cardio-Pulmonary Department

## 2020-02-21 NOTE — Progress Notes (Signed)
OT Cancellation Note  Patient Details Name: Bobby Lindsey MRN: [id number] DOB: 1955-11-21   Cancelled Treatment:    Reason Eval/Treat Not Completed: Medical issues which prohibited therapy. Chart reviewed. Pt noted with an INR 8.7, K+ 5.6. Contraindicated for participation in therapy at this time. Will re-attempt at later date/time as medically appropriate.   Jeni Salles, MPH, MS, OTR/L ascom [phone] 02/21/20, 2:59 PM

## 2020-02-21 NOTE — Progress Notes (Signed)
CRRT started 1230, pt sedated, normotensive, normothermic, no complications with access site

## 2020-02-21 NOTE — Progress Notes (Signed)
CRITICAL VALUE ALERT  Critical Value:  PT 80.5, PTT >160 , INR 10.5  Date & Time Notied:  02/21/20 @ 3818  Provider Notified: Domingo Pulse Rust[place], NP  Orders Received/Actions taken: New order to stop heparin gtt.

## 2020-02-21 NOTE — Progress Notes (Signed)
[facility], [place] 02/21/20  Subjective:   Hospital day # 3 Remains critically ill  ZOX:WRUEAVWU- nor epi Pulm: vent assisted Fio2 100% Gi: OG in place, clamped Renal: poor UOP Neuro: sedated with propofol, fentanyl 01/07 0701 - 01/08 0700 In: 232.5 [I.V.:232.5] Out: 150 [Urine:150] Lab Results  Component Value Date   CREATININE 6.89 (H) 02/21/2020   CREATININE 4.86 (H) 02/20/2020   CREATININE 2.37 (H) 02/19/2020     Objective:  Vital signs in last 24 hours:  Temp:  [96.26 F (35.7 C)-97.9 F (36.6 C)] 96.98 F (36.1 C) (01/08 0715) Pulse Rate:  [30-114] 77 (01/08 0715) Resp:  [10-32] 10 (01/08 0715) BP: (82-172)/(43-115) 97/62 (01/08 0715) SpO2:  [85 %-100 %] 98 % (01/08 0859) FiO2 (%):  [100 %] 100 % (01/08 0859) Weight:  [99.5 kg] 99.5 kg (01/08 0457)  Weight change: -10 kg Filed Weights   02/19/20 0329 02/20/20 0356 02/21/20 0457  Weight: 110.4 kg 109.5 kg 99.5 kg    Intake/Output:    Intake/Output Summary (Last 24 hours) at 02/21/2020 0955 Last data filed at 02/21/2020 0600 Gross per 24 hour  Intake 232.5 ml  Output 150 ml  Net 82.5 ml    Physical Exam: General:  critically ill, laying in the bed  HEENT  anicteric, ETT, OGT  Pulm/lungs Vent assisted, coarse  CVS/Heart  regular rhythm, no rub or gallop  Abdomen:   Soft, nontender  Extremities: trace peripheral edema, left BKA  Neurologic:  sedated  Skin:  No acute rashes  Right IJ temp cath    Basic Metabolic Panel:  Recent Labs  Lab 02/16/20 0950 02/22/20 1713 02/19/20 0228 02/20/20 0539 02/21/20 0230  NA 135 129* 132* 127* 130*  K 4.4 4.6 4.7 5.3* 5.6*  CL 97* 89* 95* 92* 92*  CO2 26 27 25  20* 22  GLUCOSE 108* 277* 178* 343* 148*  BUN 47* 93* 96* 121* 140*  CREATININE 1.43* 2.61* 2.37* 4.86* 6.89*  CALCIUM 9.1 8.4* 8.1* 8.0* 8.3*  MG  --   --  2.3 2.7* 3.0*  PHOS  --   --  4.2 6.6* 9.2*     CBC: Recent Labs  Lab 02/16/20 0950 02-22-2020 1713  02/19/20 0228 02/20/20 0539 02/21/20 0230  WBC 7.0 6.8 5.8 8.5 16.5*  NEUTROABS 4.7 5.1 4.9 7.1 14.8*  HGB 16.9 16.3 15.7 15.2 16.3  HCT 51.6 49.3 46.6 44.8 50.1  MCV 84.9 84.7 83.5 83.4 84.9  PLT 118* 117* 114* 118* 197     No results found for: HEPBSAG, HEPBSAB, HEPBIGM    Microbiology:  Recent Results (from the past 240 hour(s))  Resp Panel by RT-PCR (Flu A&B, Covid) Nasopharyngeal Swab     Status: Abnormal   Collection Time: 02/12/20  6:40 AM   Specimen: Nasopharyngeal Swab; Nasopharyngeal(NP) swabs in vial transport medium  Result Value Ref Range Status   SARS Coronavirus 2 by RT PCR POSITIVE (A) NEGATIVE Final    Comment: RESULT CALLED TO, READ BACK BY AND VERIFIED WITH: J. THURMAN RN, AT 0800 02/12/20 D. VANHOOK (NOTE) SARS-CoV-2 target nucleic acids are DETECTED.  The SARS-CoV-2 RNA is generally detectable in upper respiratory specimens during the acute phase of infection. Positive results are indicative of the presence of the identified virus, but do not rule out bacterial infection or co-infection with other pathogens not detected by the test. Clinical correlation with patient history and other diagnostic information is necessary to determine patient infection status. The expected result is Negative.  Fact Sheet for Patients: BloggerCourse.com  Fact Sheet for Healthcare Providers: SeriousBroker.it  This test is not yet approved or cleared by the [place] FDA and  has been authorized for detection and/or diagnosis of SARS-CoV-2 by FDA under an Emergency Use Authorization (EUA).  This EUA will remain in effect (meaning this te st can be used) for the duration of  the COVID-19 declaration under Section 564(b)(1) of the Act, 21 U.S.C. section 360bbb-3(b)(1), unless the authorization is terminated or revoked sooner.     Influenza A by PCR NEGATIVE NEGATIVE Final   Influenza B by PCR NEGATIVE NEGATIVE Final     Comment: (NOTE) The Xpert Xpress SARS-CoV-2/FLU/RSV plus assay is intended as an aid in the diagnosis of influenza from Nasopharyngeal swab specimens and should not be used as a sole basis for treatment. Nasal washings and aspirates are unacceptable for Xpert Xpress SARS-CoV-2/FLU/RSV testing.  Fact Sheet for Patients: BloggerCourse.com  Fact Sheet for Healthcare Providers: SeriousBroker.it  This test is not yet approved or cleared by the [place] FDA and has been authorized for detection and/or diagnosis of SARS-CoV-2 by FDA under an Emergency Use Authorization (EUA). This EUA will remain in effect (meaning this test can be used) for the duration of the COVID-19 declaration under Section 564(b)(1) of the Act, 21 U.S.C. section 360bbb-3(b)(1), unless the authorization is terminated or revoked.  Performed at [facility], [address], [place], [place] [address]   Blood Culture (routine x 2)     Status: None   Collection Time: 02/12/20  8:15 AM   Specimen: BLOOD RIGHT FOREARM  Result Value Ref Range Status   Specimen Description BLOOD RIGHT FOREARM  Final   Special Requests   Final    BOTTLES DRAWN AEROBIC AND ANAEROBIC Blood Culture adequate volume   Culture   Final    NO GROWTH 5 DAYS Performed at [facility], [address], [place], [place] [address]    Report Status 02/17/2020 FINAL  Final  Blood Culture (routine x 2)     Status: None   Collection Time: 02/12/20  9:07 AM   Specimen: BLOOD RIGHT ARM  Result Value Ref Range Status   Specimen Description BLOOD RIGHT ARM  Final   Special Requests   Final    BOTTLES DRAWN AEROBIC AND ANAEROBIC Blood Culture adequate volume   Culture   Final    NO GROWTH 5 DAYS Performed at [facility], [address], [place], [place] [address]    Report Status 02/17/2020 FINAL  Final  Culture, blood (Routine X 2) w Reflex to ID Panel     Status: None  (Preliminary result)   Collection Time: 03/12/2020  8:28 PM   Specimen: BLOOD  Result Value Ref Range Status   Specimen Description BLOOD RIGHT ANTECUBITAL  Final   Special Requests   Final    BOTTLES DRAWN AEROBIC AND ANAEROBIC Blood Culture results may not be optimal due to an excessive volume of blood received in culture bottles   Culture   Final    NO GROWTH 3 DAYS Performed at [facility], [address], [place], [place] [address]    Report Status PENDING  Incomplete  Culture, blood (Routine X 2) w Reflex to ID Panel     Status: None (Preliminary result)   Collection Time: 03/05/2020  8:30 PM   Specimen: BLOOD  Result Value Ref Range Status   Specimen Description BLOOD BLOOD RIGHT HAND  Final  Special Requests   Final    BOTTLES DRAWN AEROBIC AND ANAEROBIC Blood Culture adequate volume   Culture   Final    NO GROWTH 3 DAYS Performed at [facility], [address] [place], [place] [address]    Report Status PENDING  Incomplete  MRSA PCR Screening     Status: None   Collection Time: 2020/03/19 11:42 PM   Specimen: Nasopharyngeal  Result Value Ref Range Status   MRSA by PCR NEGATIVE NEGATIVE Final    Comment:        The GeneXpert MRSA Assay (FDA approved for NASAL specimens only), is one component of a comprehensive MRSA colonization surveillance program. It is not intended to diagnose MRSA infection nor to guide or monitor treatment for MRSA infections. Performed at [facility], [address] [place], [place] [address]     Coagulation Studies: Recent Labs    02/19/20 0228 02/20/20 0539 02/21/20 0230 02/21/20 0344 02/21/20 0545  LABPROT 38.8* 35.9* 84.8* 80.5* 69.1*  INR 4.1* 3.7* 11.3* 10.5* 8.7*    Urinalysis: No results for input(s): COLORURINE, LABSPEC, PHURINE, GLUCOSEU, HGBUR, BILIRUBINUR, KETONESUR, PROTEINUR, UROBILINOGEN, NITRITE, LEUKOCYTESUR in the last 72 hours.  Invalid input(s): APPERANCEUR    Imaging: DG  Chest 1 View  Result Date: 02/21/2020 CLINICAL DATA:  Initial evaluation for central line placement. EXAM: CHEST  1 VIEW COMPARISON:  Prior radiograph from earlier the same day. FINDINGS: The right IJ approach central venous catheter has been retracted, with tip now overlying the proximal right atrium. Enteric tube remains in place, tip well above the carina. Enteric tube courses into the abdomen. Stable cardiomegaly. Mediastinal silhouette normal. Diffuse related pad overlies the left chest. Lungs hypoinflated. Extensive bilateral airspace disease is little interval change from previous. Small left pleural effusion. No pneumothorax. Osseous structures are unchanged. IMPRESSION: 1. Interval retraction of right IJ approach central venous catheter, with tip now overlying the proximal right atrium. 2. Otherwise stable appearance of the chest with extensive bilateral airspace disease and small left pleural effusion. Electronically Signed   By: Rise Mu M.D.   On: 02/21/2020 02:52   DG Abd 1 View  Result Date: 02/21/2020 CLINICAL DATA:  Orogastric tube placement. EXAM: ABDOMEN - 1 VIEW COMPARISON:  None. FINDINGS: Tip and side port of the enteric tube below the diaphragm in the stomach. Air throughout nondilated large and small bowel. Small volume of colonic stool. IMPRESSION: Tip and side port of the enteric tube below the diaphragm in the stomach. Electronically Signed   By: Narda [name] M.D.   On: 02/21/2020 01:46   DG Chest Port 1 View  Result Date: 02/21/2020 CLINICAL DATA:  Central line placement, acute respiratory failure EXAM: PORTABLE CHEST 1 VIEW COMPARISON:  Radiograph 02/19/2019, CT 06/17/2018 FINDINGS: Right IJ catheter tip terminates deep quite deep within the right atrium (see annotated image). The endotracheal tube terminates in the mid trachea, 5.5 cm from the carina. Transesophageal tube tip and side port terminate distal to the GE junction. Telemetry leads and external support  devices overlie the chest. Some interval clearing in the left lung base may reflect improved aeration though there is increasingly coalescent opacity and air bronchograms in the right mid lung and retrocardiac space. No pneumothorax. Suspect small bilateral effusions. Marked cardiomegaly is similar to prior though portions of the cardiac silhouette are silhouetted by overlying opacity. 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 IMPRESSION: 1. Right IJ catheter tip terminates deep quite deep within the right atrium. Recommend retraction  approximately 9 cm to position towards the superior cavoatrial junction with reimaging to confirm placement. 2. Satisfactory positioning of the endotracheal and transesophageal tubes. 3. Improved aeration of the left lung base but with increasingly coalescent opacity and air bronchograms in the right mid lung and retrocardiac space could reflect worsening airspace disease and/or edema. 4. Suspect small bilateral effusions. 5. Marked cardiomegaly. Electronically Signed   By: Kreg Shropshire M.D.   On: 02/21/2020 01:47     Medications:   . sodium chloride Stopped (03/11/2020 2116)  . sodium chloride Stopped (02/20/20 1126)  . fentaNYL infusion INTRAVENOUS 50 mcg/hr (02/21/20 0600)  . norepinephrine (LEVOPHED) Adult infusion 2 mcg/min (02/21/20 0600)  . propofol (DIPRIVAN) infusion 35 mcg/kg/min (02/21/20 0600)  . remdesivir 100 mg in NS 100 mL     . [START ON 02/22/2020] vitamin C  500 mg Per Tube Daily  . aspirin  81 mg Per Tube Daily  . atorvastatin  80 mg Per Tube Daily  . chlorhexidine gluconate (MEDLINE KIT)  15 mL Mouth Rinse BID  . Chlorhexidine Gluconate Cloth  6 each Topical Daily  . docusate  100 mg Per Tube BID  . fentaNYL (SUBLIMAZE) injection  25 mcg Intravenous Once  . [START ON 02/22/2020] folic acid  1 mg Per Tube Daily  . insulin aspart  0-20 Units Subcutaneous Q4H  . insulin glargine  20 Units Subcutaneous QHS  . isosorbide mononitrate  30 mg  Oral Daily  . mouth rinse  15 mL Mouth Rinse 10 times per day  . methylPREDNISolone (SOLU-MEDROL) injection  20 mg Intravenous Daily  . metoCLOPramide (REGLAN) injection  10 mg Intravenous Q8H  . midazolam      . midazolam      . [START ON 02/22/2020] multivitamin with minerals  1 tablet Per Tube Daily  . pantoprazole (PROTONIX) IV  40 mg Intravenous Q12H  . polyethylene glycol  17 g Per Tube Daily  . [START ON 02/22/2020] thiamine  100 mg Per Tube Daily  . [START ON 02/22/2020] zinc sulfate  220 mg Per Tube Daily   acetaminophen, chlorpheniramine-HYDROcodone, fentaNYL, fluticasone, gabapentin, guaiFENesin-dextromethorphan, heparin sodium (porcine), midazolam, midazolam, morphine injection  Assessment/ Plan:  65 y.o. male with diabetes type 2, hypertension, hyperlipidemia, coronary artery disease, obstructive sleep apnea, atrial fibrillation, history of right leg DVT 2015 January,   admitted on 03/10/2020 for COVID-19 [U07.1] STEMI (ST elevation myocardial infarction) (HCC) [I21.3]  1. ARF with hyperkalemia Urinalysis from February 12, 2020 small hemoglobin, negative protein, 0-5 RBCs, 0-5 WBCs No renal imaging available at present CRRT started 02/21/2020  2. Acute hypoxic resp failure -intubated on 1/7 -Requiring ventilator support.  FiO2 100%  3. Covid -19 pneumonia IV remdesivir start 03/14/2020 Solu-Medrol started 03/15/2020  Plan: BUN increased to 140, creatinine 6.9, potassium increasing to 5.6.  Due to azotemia and worsening hyperkalemia and oliguria, recommend starting CRRT. 2 k bath UF 50 cc/h as tolerated    LOS: 3 Cayne Yom 1/8/20229:55 AM  [facility] [place], [place] [phone]  Note: This note was prepared with Dragon dictation. Any transcription errors are unintentional

## 2020-02-21 NOTE — Consult Note (Addendum)
ANTICOAGULATION CONSULT NOTE -   Pharmacy Consult for heparin infusion Indication: chest pain/ACS  Allergies  Allergen Reactions  . Other Other (See Comments)    Steroids- "BGL goes SKY-HIGH"  . Simvastatin Diarrhea and Other (See Comments)    Fatigue, also      Patient Measurements: Height: 5\' 11"  (180.3 cm) Weight: 109.5 kg (241 lb 6.5 oz) IBW/kg (Calculated) : 75.3 Heparin Dosing Weight: 101.3 kg  Vital Signs: Temp: 97.9 F (36.6 C) (01/07 2000) Temp Source: Axillary (01/07 2000) BP: 103/65 (01/08 0000) Pulse Rate: 65 (01/08 0000)  Labs: Recent Labs    02/27/2020 1713 03/01/2020 2028 02/19/20 0228 02/20/20 0539  HGB 16.3  --  15.7 15.2  HCT 49.3  --  46.6 44.8  PLT 117*  --  114* 118*  APTT 45*  --   --   --   LABPROT 24.0*  --  38.8* 35.9*  INR 2.2*  --  4.1* 3.7*  HEPARINUNFRC  --   --  0.39  --   CREATININE 2.61*  --  2.37* 4.86*  TROPONINIHS 4,704* 5,901*  --   --     Estimated Creatinine Clearance: 19.1 mL/min (A) (by C-G formula based on SCr of 4.86 mg/dL (H)).   Medical History: Past Medical History:  Diagnosis Date  . Anginal pain ([place])    last pm  . Atrial fibrillation ([place])    a. Transient during 03/2013 admission.  . Bradycardia    a. Bradycardia/pauses/possible Mobitz II during 03/2013 adm. Not on BB due to this.  Marland Kitchen CAD (coronary artery disease)    a. s/p CABG 2002. b. Hx Cypher stent to the RCA. c. Inf-lat STEMI 03/2013:  LHC (04/05/13):  mLAD occluded, pD1 90, apical br of Dx occluded, CFX occluded, pOM1 90-95, RCA stents patent, diff RCA 30, S-Dx occluded, S-PDA occluded, S-OM1 40-50, L-LAD patent, EF 40% with inf HK.  PCI:  Promus (2.5 x 28) DES to mid to dist CFX.  Marland Kitchen Charcot's joint of knee   . COPD (chronic obstructive pulmonary disease) ([place])   . Deep venous thrombosis (HCC)    right lower extremity  . Diabetes mellitus    a. A1C 10.7 in 03/2013.  . Diastolic CHF ([place])    a. EF 40% by cath, 55-60% during 03/2013 adm, required IV diuresis.   . DVT, lower extremity, recurrent ([place])    a. Hx recurrent DVT per record.  . Dyslipidemia   . Elevated CK    a. Pt has refused rheum workup in the past.  . GERD (gastroesophageal reflux disease)   . History of epidermal inclusion cyst excision 07/09/2018  . History of hiatal hernia   . HTN (hypertension)    x 15 years  . Hx of cardiovascular stress test    a. Lexiscan Myoview (03/2010):  diaph atten vs inf scar, no ischemia, EF 47%; Low Risk.  Marland Kitchen Hx of echocardiogram    a. Echo (04/08/13):  Mild LVH, EF 55-60%, restrictive physiology, severe LAE, mild reduced RVSF, mild RAE.  . Leg pain    ABI 6/16:  R 1.2, L 1.1 - normal  . Myocardial infarction ([place])   . Obesity   . Peripheral neuropathy   . Pulmonary embolism ([place])   . Sleep apnea     Medications:  PTA Warfarin: 10 mg PO daily (70 mg total weekly dose) 01/05 1730: Heparin 4,000 unit IV bolus x 1 given 01/05 1829: Bivalirudin bolus + infusion >> ending 1927  Assessment: Patient is  a 21 yom that is being admitted for STEMI. Patient is on warfarin at home for history of a PE, DVT, and afib. Pharmacy has been consulted for heparin infusion for ACS. INR 2.2 on admission. Assumed last dose of warfarin 10 mg  01/04 evening   0106 0228 HL 0.39, therapeutic x 1.  Will continue Heparin at current rate and recheck HL in 6 hours to confirm. 0106 1006 Heparin Infusion DC following transition to comfort care 0108 0036 Heparin Consult reordered following pt transition to FULL CODE 0108 0230 INR = 11.3, PT = 84.8.  NP ordering repeat INR STAT.  Ordered STAT aPTT.   0108 0344 INR = 10.5, PT = 80.5, aPTT >160.  INR & PT trending slightly downward.  NP unsure of etiology of spike in INR & PT, so stopping heparin drip at this time.  Reordering HL to be drawn STAT. 0108 0545 HL = 0.64, therapeutic  Goal of Therapy:  Heparin level 0.3-0.7 units/ml Monitor platelets by anticoagulation protocol: Yes   Plan:   Heparin stopped at this time due  to elevated INR/PT/aPTT .  Pharmacy will continue to follow.  Renda Rolls, PharmD, [facility] 02/21/2020 12:51 AM

## 2020-02-22 ENCOUNTER — Inpatient Hospital Stay: Payer: Medicare HMO

## 2020-02-22 ENCOUNTER — Encounter: Payer: Self-pay | Admitting: Internal Medicine

## 2020-02-22 DIAGNOSIS — J80 Acute respiratory distress syndrome: Secondary | ICD-10-CM

## 2020-02-22 LAB — COMPREHENSIVE METABOLIC PANEL
ALT: 30 U/L (ref 0–44)
AST: 57 U/L — ABNORMAL HIGH (ref 15–41)
Albumin: 2.4 g/dL — ABNORMAL LOW (ref 3.5–5.0)
Alkaline Phosphatase: 80 U/L (ref 38–126)
Anion gap: 19 — ABNORMAL HIGH (ref 5–15)
BUN: 126 mg/dL — ABNORMAL HIGH (ref 8–23)
CO2: 18 mmol/L — ABNORMAL LOW (ref 22–32)
Calcium: 8 mg/dL — ABNORMAL LOW (ref 8.9–10.3)
Chloride: 95 mmol/L — ABNORMAL LOW (ref 98–111)
Creatinine, Ser: 7.43 mg/dL — ABNORMAL HIGH (ref 0.61–1.24)
GFR, Estimated: 8 mL/min — ABNORMAL LOW (ref >60)
Glucose, Bld: 162 mg/dL — ABNORMAL HIGH (ref 70–99)
Potassium: 6.2 mmol/L — ABNORMAL HIGH (ref 3.5–5.1)
Sodium: 132 mmol/L — ABNORMAL LOW (ref 135–145)
Total Bilirubin: 1.3 mg/dL — ABNORMAL HIGH (ref 0.3–1.2)
Total Protein: 6.5 g/dL (ref 6.5–8.1)

## 2020-02-22 LAB — GLUCOSE, CAPILLARY
Glucose-Capillary: 152 mg/dL — ABNORMAL HIGH (ref 70–99)
Glucose-Capillary: 145 mg/dL — ABNORMAL HIGH (ref 70–99)
Glucose-Capillary: 148 mg/dL — ABNORMAL HIGH (ref 70–99)
Glucose-Capillary: 140 mg/dL — ABNORMAL HIGH (ref 70–99)
Glucose-Capillary: 151 mg/dL — ABNORMAL HIGH (ref 70–99)
Glucose-Capillary: 130 mg/dL — ABNORMAL HIGH (ref 70–99)
Glucose-Capillary: 173 mg/dL — ABNORMAL HIGH (ref 70–99)

## 2020-02-22 LAB — CBC WITH DIFFERENTIAL/PLATELET
Abs Immature Granulocytes: 0.12 K/uL — ABNORMAL HIGH (ref 0.00–0.07)
Basophils Absolute: 0 K/uL (ref 0.0–0.1)
Basophils Relative: 0 %
Eosinophils Absolute: 0 K/uL (ref 0.0–0.5)
Eosinophils Relative: 0 %
HCT: 45.7 % (ref 39.0–52.0)
Hemoglobin: 14.7 g/dL (ref 13.0–17.0)
Immature Granulocytes: 1 %
Lymphocytes Relative: 4 %
Lymphs Abs: 0.5 K/uL — ABNORMAL LOW (ref 0.7–4.0)
MCH: 27.6 pg (ref 26.0–34.0)
MCHC: 32.2 g/dL (ref 30.0–36.0)
MCV: 85.7 fL (ref 80.0–100.0)
Monocytes Absolute: 0.9 K/uL (ref 0.1–1.0)
Monocytes Relative: 6 %
Neutro Abs: 14 K/uL — ABNORMAL HIGH (ref 1.7–7.7)
Neutrophils Relative %: 89 %
Platelets: 151 K/uL (ref 150–400)
RBC: 5.33 MIL/uL (ref 4.22–5.81)
RDW: 15.8 % — ABNORMAL HIGH (ref 11.5–15.5)
WBC: 15.6 K/uL — ABNORMAL HIGH (ref 4.0–10.5)
nRBC: 0 % (ref 0.0–0.2)

## 2020-02-22 LAB — BLOOD GAS, ARTERIAL
Acid-base deficit: 10.5 mmol/L — ABNORMAL HIGH (ref 0.0–2.0)
Bicarbonate: 16.8 mmol/L — ABNORMAL LOW (ref 20.0–28.0)
FIO2: 1
MECHVT: 550 mL
O2 Saturation: 99.7 %
PEEP: 10 cmH2O
Patient temperature: 37
RATE: 24 resp/min
pCO2 arterial: 41 mmHg (ref 32.0–48.0)
pH, Arterial: 7.22 — ABNORMAL LOW (ref 7.350–7.450)
pO2, Arterial: 216 mmHg — ABNORMAL HIGH (ref 83.0–108.0)

## 2020-02-22 LAB — FERRITIN: Ferritin: 2751 ng/mL — ABNORMAL HIGH (ref 24–336)

## 2020-02-22 LAB — C-REACTIVE PROTEIN: CRP: 19.4 mg/dL — ABNORMAL HIGH (ref <1.0)

## 2020-02-22 LAB — RENAL FUNCTION PANEL
Albumin: 2.6 g/dL — ABNORMAL LOW (ref 3.5–5.0)
Anion gap: 14 (ref 5–15)
BUN: 76 mg/dL — ABNORMAL HIGH (ref 8–23)
CO2: 26 mmol/L (ref 22–32)
Calcium: 7.8 mg/dL — ABNORMAL LOW (ref 8.9–10.3)
Chloride: 95 mmol/L — ABNORMAL LOW (ref 98–111)
Creatinine, Ser: 5.19 mg/dL — ABNORMAL HIGH (ref 0.61–1.24)
GFR, Estimated: 12 mL/min — ABNORMAL LOW (ref >60)
Glucose, Bld: 133 mg/dL — ABNORMAL HIGH (ref 70–99)
Phosphorus: 6.5 mg/dL — ABNORMAL HIGH (ref 2.5–4.6)
Potassium: 4.4 mmol/L (ref 3.5–5.1)
Sodium: 135 mmol/L (ref 135–145)

## 2020-02-22 LAB — PROTIME-INR
INR: 5.9 (ref 0.8–1.2)
Prothrombin Time: 51.2 seconds — ABNORMAL HIGH (ref 11.4–15.2)

## 2020-02-22 LAB — D-DIMER, QUANTITATIVE: D-Dimer, Quant: 6.27 ug/mL-FEU — ABNORMAL HIGH (ref 0.00–0.50)

## 2020-02-22 LAB — HEPATITIS B SURFACE ANTIGEN: Hepatitis B Surface Ag: NONREACTIVE

## 2020-02-22 LAB — PHOSPHORUS: Phosphorus: 10.5 mg/dL — ABNORMAL HIGH (ref 2.5–4.6)

## 2020-02-22 MED ORDER — HYDROCORTISONE NA SUCCINATE PF 100 MG IJ SOLR
50.0000 mg | Freq: Four times a day (QID) | INTRAMUSCULAR | Status: DC
  Administered 2020-02-22 – 2020-02-26 (×17): 50 mg via INTRAVENOUS
  Filled 2020-02-22 (×17): qty 2

## 2020-02-22 MED ORDER — PHENYLEPHRINE CONCENTRATED 100MG/250ML (0.4 MG/ML) INFUSION SIMPLE
0.0000 ug/min | INTRAVENOUS | Status: DC
  Administered 2020-02-22: 20 ug/min via INTRAVENOUS
  Administered 2020-02-23: 110 ug/min via INTRAVENOUS
  Administered 2020-02-24: 120 ug/min via INTRAVENOUS
  Filled 2020-02-22 (×5): qty 250

## 2020-02-22 MED ORDER — METOPROLOL TARTRATE 5 MG/5ML IV SOLN
INTRAVENOUS | Status: AC
  Administered 2020-02-22: 5 mg
  Filled 2020-02-22: qty 5

## 2020-02-22 MED ORDER — METOPROLOL TARTRATE 5 MG/5ML IV SOLN: 5.0000 mg | Freq: Once | INTRAVENOUS | Status: AC

## 2020-02-22 NOTE — Progress Notes (Signed)
[place], [place] 02/22/20  Subjective:   Hospital day # 4 Remains critically ill  PQZ:RAQTMAUQ- nor epi Pulm: vent assisted Fio 70% Gi: OG in place, clamped Renal: poor UOP- CRRT could not be run last night due to high dialyzer pressures. INR already high so not option of heparin Neuro: sedated with propofol, fentanyl 01/08 0701 - 01/09 0700 In: 962.1 [I.V.:962.1] Out: 664.3 [Urine:25] Lab Results  Component Value Date   CREATININE 7.43 (H) 02/22/2020   CREATININE 6.96 (H) 02/21/2020   CREATININE 6.89 (H) 02/21/2020     Objective:  Vital signs in last 24 hours:  Temp:  [96.44 F (35.8 C)-98.96 F (37.2 C)] 97.52 F (36.4 C) (01/09 1000) Pulse Rate:  [83-102] 93 (01/09 1000) Resp:  [0-26] 24 (01/09 1000) BP: (95-133)/(38-68) 103/54 (01/09 1000) SpO2:  [87 %-100 %] 100 % (01/09 1000) FiO2 (%):  [100 %] 100 % (01/09 0732) Weight:  [110.5 kg] 110.5 kg (01/09 0425)  Weight change: 11 kg Filed Weights   02/20/20 0356 02/21/20 0457 02/22/20 0425  Weight: 109.5 kg 99.5 kg 110.5 kg    Intake/Output:    Intake/Output Summary (Last 24 hours) at 02/22/2020 1015 Last data filed at 02/22/2020 0600 Gross per 24 hour  Intake 962.05 ml  Output 664.3 ml  Net 297.75 ml    Physical Exam: General:  critically ill, laying in the bed  HEENT  anicteric, ETT, OGT  Pulm/lungs  Vent assisted, coarse  CVS/Heart  regular rhythm, no rub or gallop  Abdomen:   Soft, nontender  Extremities:  trace peripheral edema, left BKA  Neurologic:  sedated  Skin:  No acute rashes  Right IJ temp cath    Basic Metabolic Panel:  Recent Labs  Lab 02/19/20 0228 02/20/20 0539 02/21/20 0230 02/21/20 1547 02/22/20 0628  NA 132* 127* 130* 131* 132*  K 4.7 5.3* 5.6* 6.1* 6.2*  CL 95* 92* 92* 93* 95*  CO2 25 20* 22 19* 18*  GLUCOSE 178* 343* 148* 123* 162*  BUN 96* 121* 140* 138* 126*  CREATININE 2.37* 4.86* 6.89* 6.96* 7.43*  CALCIUM 8.1* 8.0* 8.3* 8.2* 8.0*   MG 2.3 2.7* 3.0*  --   --   PHOS 4.2 6.6* 9.2* 9.1* 10.5*     CBC: Recent Labs  Lab 02/17/2020 1713 02/19/20 0228 02/20/20 0539 02/21/20 0230 02/22/20 0628  WBC 6.8 5.8 8.5 16.5* 15.6*  NEUTROABS 5.1 4.9 7.1 14.8* 14.0*  HGB 16.3 15.7 15.2 16.3 14.7  HCT 49.3 46.6 44.8 50.1 45.7  MCV 84.7 83.5 83.4 84.9 85.7  PLT 117* 114* 118* 197 151      Lab Results  Component Value Date   HEPBSAB NON REACTIVE 02/21/2020   HEPBIGM NON REACTIVE 02/21/2020      Microbiology:  Recent Results (from the past 240 hour(s))  Culture, blood (Routine X 2) w Reflex to ID Panel     Status: None (Preliminary result)   Collection Time: 03/10/2020  8:28 PM   Specimen: BLOOD  Result Value Ref Range Status   Specimen Description BLOOD RIGHT ANTECUBITAL  Final   Special Requests   Final    BOTTLES DRAWN AEROBIC AND ANAEROBIC Blood Culture results may not be optimal due to an excessive volume of blood received in culture bottles   Culture   Final    NO GROWTH 4 DAYS Performed at [facility], [address], [place], [place] [address]    Report Status PENDING  Incomplete  Culture, blood (Routine  X 2) w Reflex to ID Panel     Status: None (Preliminary result)   Collection Time: 03/03/2020  8:30 PM   Specimen: BLOOD  Result Value Ref Range Status   Specimen Description BLOOD BLOOD RIGHT HAND  Final   Special Requests   Final    BOTTLES DRAWN AEROBIC AND ANAEROBIC Blood Culture adequate volume   Culture   Final    NO GROWTH 4 DAYS Performed at [facility], [address] [place], [place] [address]    Report Status PENDING  Incomplete  MRSA PCR Screening     Status: None   Collection Time: 03/07/2020 11:42 PM   Specimen: Nasopharyngeal  Result Value Ref Range Status   MRSA by PCR NEGATIVE NEGATIVE Final    Comment:        The GeneXpert MRSA Assay (FDA approved for NASAL specimens only), is one component of a comprehensive MRSA colonization surveillance program. It is  not intended to diagnose MRSA infection nor to guide or monitor treatment for MRSA infections. Performed at [facility], [address] [place], [place] [address]     Coagulation Studies: Recent Labs    02/21/20 0230 02/21/20 0344 02/21/20 0545 02/21/20 1547 02/21/20 2139  LABPROT 84.8* 80.5* 69.1* 45.3* 51.3*  INR 11.3* 10.5* 8.7* 5.0* 5.9*    Urinalysis: No results for input(s): COLORURINE, LABSPEC, PHURINE, GLUCOSEU, HGBUR, BILIRUBINUR, KETONESUR, PROTEINUR, UROBILINOGEN, NITRITE, LEUKOCYTESUR in the last 72 hours.  Invalid input(s): APPERANCEUR    Imaging: DG Chest 1 View  Result Date: 02/21/2020 CLINICAL DATA:  Initial evaluation for central line placement. EXAM: CHEST  1 VIEW COMPARISON:  Prior radiograph from earlier the same day. FINDINGS: The right IJ approach central venous catheter has been retracted, with tip now overlying the proximal right atrium. Enteric tube remains in place, tip well above the carina. Enteric tube courses into the abdomen. Stable cardiomegaly. Mediastinal silhouette normal. Diffuse related pad overlies the left chest. Lungs hypoinflated. Extensive bilateral airspace disease is little interval change from previous. Small left pleural effusion. No pneumothorax. Osseous structures are unchanged. IMPRESSION: 1. Interval retraction of right IJ approach central venous catheter, with tip now overlying the proximal right atrium. 2. Otherwise stable appearance of the chest with extensive bilateral airspace disease and small left pleural effusion. Electronically Signed   By: Jeannine Boga M.D.   On: 02/21/2020 02:52   DG Abd 1 View  Result Date: 02/21/2020 CLINICAL DATA:  Orogastric tube placement. EXAM: ABDOMEN - 1 VIEW COMPARISON:  None. FINDINGS: Tip and side port of the enteric tube below the diaphragm in the stomach. Air throughout nondilated large and small bowel. Small volume of colonic stool. IMPRESSION: Tip and side port of the  enteric tube below the diaphragm in the stomach. Electronically Signed   By: Keith Rake M.D.   On: 02/21/2020 01:46   DG Chest Port 1 View  Result Date: 02/22/2020 CLINICAL DATA:  Acute respiratory failure EXAM: PORTABLE CHEST 1 VIEW COMPARISON:  Yesterday FINDINGS: Cardiomegaly and vascular pedicle widening. Endotracheal tube with tip halfway between the clavicular heads and carina. Dialysis catheter with tip at the upper cavoatrial junction. Enteric tube with tip at the distal stomach. Confluent interstitial and airspace opacity. Lung volumes have possibly improved. CABG. IMPRESSION: 1. Stable hardware positioning. 2. Possible improvement in lung volumes. Pulmonary opacification remains extensive. Electronically Signed   By: Monte Fantasia M.D.   On: 02/22/2020 07:16   DG Chest Port 1 View  Result Date: 02/21/2020 CLINICAL  DATA:  Central line placement, acute respiratory failure EXAM: PORTABLE CHEST 1 VIEW COMPARISON:  Radiograph 02/19/2019, CT 06/17/2018 FINDINGS: Right IJ catheter tip terminates deep quite deep within the right atrium (see annotated image). The endotracheal tube terminates in the mid trachea, 5.5 cm from the carina. Transesophageal tube tip and side port terminate distal to the GE junction. Telemetry leads and external support devices overlie the chest. Some interval clearing in the left lung base may reflect improved aeration though there is increasingly coalescent opacity and air bronchograms in the right mid lung and retrocardiac space. No pneumothorax. Suspect small bilateral effusions. Marked cardiomegaly is similar to prior though portions of the cardiac silhouette are silhouetted by overlying opacity. 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 IMPRESSION: 1. Right IJ catheter tip terminates deep quite deep within the right atrium. Recommend retraction approximately 9 cm to position towards the superior cavoatrial junction with reimaging to confirm placement. 2.  Satisfactory positioning of the endotracheal and transesophageal tubes. 3. Improved aeration of the left lung base but with increasingly coalescent opacity and air bronchograms in the right mid lung and retrocardiac space could reflect worsening airspace disease and/or edema. 4. Suspect small bilateral effusions. 5. Marked cardiomegaly. Electronically Signed   By: Lovena Le M.D.   On: 02/21/2020 01:47     Medications:   . sodium chloride Stopped (03/14/2020 2116)  . sodium chloride Stopped (02/20/20 1126)  . fentaNYL infusion INTRAVENOUS 175 mcg/hr (02/22/20 0600)  . norepinephrine (LEVOPHED) Adult infusion 7 mcg/min (02/22/20 0600)  . prismasol BGK 2/2.5 dialysis solution 1,500 mL/hr at 02/21/20 1919  . prismasol BGK 2/2.5 replacement solution 300 mL/hr at 02/21/20 1230  . prismasol BGK 2/2.5 replacement solution 300 mL/hr at 02/21/20 1230  . propofol (DIPRIVAN) infusion 30 mcg/kg/min (02/22/20 0718)   . vitamin C  500 mg Per Tube Daily  . aspirin  81 mg Per Tube Daily  . atorvastatin  80 mg Per Tube Daily  . chlorhexidine gluconate (MEDLINE KIT)  15 mL Mouth Rinse BID  . Chlorhexidine Gluconate Cloth  6 each Topical Daily  . docusate  100 mg Per Tube BID  . fentaNYL (SUBLIMAZE) injection  25 mcg Intravenous Once  . folic acid  1 mg Per Tube Daily  . hydrocortisone sod succinate (SOLU-CORTEF) inj  50 mg Intravenous Q6H  . insulin aspart  0-20 Units Subcutaneous Q4H  . insulin glargine  20 Units Subcutaneous QHS  . isosorbide mononitrate  30 mg Oral Daily  . mouth rinse  15 mL Mouth Rinse 10 times per day  . metoCLOPramide (REGLAN) injection  5 mg Intravenous Q8H  . multivitamin with minerals  1 tablet Per Tube Daily  . pantoprazole (PROTONIX) IV  40 mg Intravenous Q12H  . polyethylene glycol  17 g Per Tube Daily  . thiamine  100 mg Per Tube Daily  . zinc sulfate  220 mg Per Tube Daily   acetaminophen, chlorpheniramine-HYDROcodone, fentaNYL, fluticasone, gabapentin,  guaiFENesin-dextromethorphan, heparin, midazolam, midazolam, morphine injection, sodium chloride  Assessment/ Plan:  65 y.o. male with diabetes type 2, hypertension, hyperlipidemia, coronary artery disease, obstructive sleep apnea, atrial fibrillation, history of right leg DVT 2015 January,   admitted on 03/11/2020 for COVID-19 [U07.1] STEMI (ST elevation myocardial infarction) ([place]) [I21.3]  1. ARF with hyperkalemia and acidosis Urinalysis from February 12, 2020 small hemoglobin, negative protein, 0-5 RBCs, 0-5 WBCs No renal imaging available at present CRRT started 02/21/2020-d/c 1/9 due to high dialyzer pressure and poor BFR Attempt IHD today for hyperkalemia  and acidosis Case discussed with wife, Amy, and consent obtained.  2. Acute hypoxic resp failure -intubated on 1/7 -Requiring ventilator support   3. Covid -19 pneumonia IV remdesivir start 02/17/2020 Solu-Medrol started 03/06/2020      LOS: [place] 1/9/202210:15 AM  [place], [place]  Note: This note was prepared with Dragon dictation. Any transcription errors are unintentional

## 2020-02-22 NOTE — Progress Notes (Signed)
GOALS OF CARE DISCUSSION  The Clinical status was relayed to family in detail. Wife Amy Seufert  Updated and notified of patients medical condition.  Patient remains unresponsive and will not open eyes to command.   Upon assessment his breath sounds are course crackles with significant secretions to oral pharyngeal region.    patient with increased WOB and using accessory muscles to breathe Explained to family course of therapy and the modalities     Patient with Progressive multiorgan failure with very low chance of meaningful recovery despite all aggressive and optimal medical therapy. Patient is in the Dying  Process associated with Suffering.  Family understands the situation.  They have consented and agreed to DNR.  Family are satisfied with Plan of action and management. All questions answered  Additional CC time 32 mins   Aury Scollard Patricia Pesa, M.D.  [facility]  Medical Director [place] Director [facility] Cardio-Pulmonary Department

## 2020-02-22 NOTE — Progress Notes (Signed)
CRRT stopped for now due to high filter pressure alarms and negative return pressure alarms on prismax machine. Alarms started 1950, catheter flushed and new filter set placed. Return line does have a small amount of resistance. Cathflo order placed. Cathflo dwelled for 2 hours and CRRT restarted at [place]. Ran ok for 15 minutes and started alarming high filter pressure again. Troubleshooted with flushing catheter, repositioning pt, and changing BFR. Eventually would not run at all. MD notified and ordered to disconnect pt for now. Will continue to monitor.

## 2020-02-22 NOTE — Progress Notes (Signed)
Patient developed AF RVR, with HR sustaining > 140. PMHx of transient AFib/A-flutter.  P: - 5 mg metoprolol - transition levophed gtt to neo-synephrine gtt for BP control  Plan discussed with care RN, will continue to monitor patient closely.    Domingo Pulse [place], AGACNP-BC Acute Care Nurse Practitioner [facility]   [phone] / [phone] Please see Amion for pager details.

## 2020-02-22 NOTE — Progress Notes (Signed)
CRITICAL CARE NOTE 65 yo male recently diagnosed with COVID-19 pneumonia on 12/30 which he required hospitalization at [facility] 12/30 to 01/3 now admitted with STEMI and acute hypoxic respiratory failure secondary to COVID-19 pneumonia requiring Bipap s/p cardiac catheterization with unsuccessful PCI and stent placement to SVG per cardiology recommending uninterrupted dual antiplatelet therapy  Cardiac cath revealed multiple occlusions, however PCI and stent placement to the SVG unsuccessful per cardiology recommending uninterrupted dual antiplatelet therapy for a minimum of 6 months.   Pt admitted to ICU post procedure due to Bipap requirement secondary to acute hypoxic respiratory failure in the setting of COVID-19 pneumonia.    Significant Hospital Events:  01/5: Pt admitted to ICU post cardiac cath 1/6 TRANSFERRED TO [facility]  1/7 Progressive resp failure and renal failure, Family made aware and reversed CODE status 1/7 patient was emergently intubated and placed on MV suport 1/7 vasc cath placed in anticipation for CRRT 1/8 severe resp failure, on CRRT, on pressors     CC  follow up respiratory failure  SUBJECTIVE Patient remains critically ill Prognosis is guarded Severe multiorgan failure On pressors, on CRRT but stopped due to critical illness   Vent Mode: PRVC FiO2 (%):  [100 %] 100 % Set Rate:  [24 bmp] 24 bmp Vt Set:  [550 mL] 550 mL PEEP:  [10 cmH20] 10 cmH20 Plateau Pressure:  [24 cmH20-26 cmH20] 24 cmH20 CBC    Component Value Date/Time   WBC 15.6 (H) 02/22/2020 0628   RBC 5.33 02/22/2020 0628   HGB 14.7 02/22/2020 0628   HGB 14.9 12/01/2019 1618   HCT 45.7 02/22/2020 0628   HCT 44.6 12/01/2019 1618   PLT 151 02/22/2020 0628   PLT 174 12/01/2019 1618   MCV 85.7 02/22/2020 0628   MCV 87 12/01/2019 1618   MCH 27.6 02/22/2020 0628   MCHC 32.2 02/22/2020 0628   RDW 15.8 (H) 02/22/2020 0628   RDW 13.9 12/01/2019 1618   LYMPHSABS 0.5 (L) 02/22/2020  0628   LYMPHSABS 2.1 03/31/2015 1100   MONOABS 0.9 02/22/2020 0628   EOSABS 0.0 02/22/2020 0628   EOSABS 0.2 03/31/2015 1100   BASOSABS 0.0 02/22/2020 0628   BASOSABS 0.0 03/31/2015 1100   BMP Latest Ref Rng & Units 02/21/2020 02/21/2020 02/20/2020  Glucose 70 - 99 mg/dL 123(H) 148(H) 343(H)  BUN 8 - 23 mg/dL 138(H) 140(H) 121(H)  Creatinine 0.61 - 1.24 mg/dL 6.96(H) 6.89(H) 4.86(H)  BUN/Creat Ratio 10 - 24 - - -  Sodium 135 - 145 mmol/L 131(L) 130(L) 127(L)  Potassium 3.5 - 5.1 mmol/L 6.1(H) 5.6(H) 5.3(H)  Chloride 98 - 111 mmol/L 93(L) 92(L) 92(L)  CO2 22 - 32 mmol/L 19(L) 22 20(L)  Calcium 8.9 - 10.3 mg/dL 8.2(L) 8.3(L) 8.0(L)    BP (!) 107/51   Pulse 96   Temp (!) 97.52 F (36.4 C)   Resp (!) 24   Ht 5\' 11"  (1.803 m)   Wt 110.5 kg   SpO2 100%   BMI 33.98 kg/m    I/O last 3 completed shifts: In: 1194.6 [I.V.:1194.6] Out: 814.3 [Urine:175; Other:639.3] No intake/output data recorded.  SpO2: 100 % O2 Flow Rate (L/min): 15 L/min FiO2 (%): 100 %  Estimated body mass index is 33.98 kg/m as calculated from the following:   Height as of this encounter: 5\' 11"  (1.803 m).   Weight as of this encounter: 110.5 kg.  SIGNIFICANT EVENTS   REVIEW OF SYSTEMS  PATIENT IS UNABLE TO PROVIDE COMPLETE REVIEW OF SYSTEMS DUE TO SEVERE  CRITICAL ILLNESS      COVID-19 DISASTER DECLARATION:   FULL CONTACT PHYSICAL EXAMINATION WAS NOT POSSIBLE DUE TO TREATMENT OF COVID-19  AND CONSERVATION OF PERSONAL PROTECTIVE EQUIPMENT, LIMITED EXAM FINDINGS INCLUDE-   PHYSICAL EXAMINATION:  GENERAL:critically ill appearing, +resp distress NEUROLOGIC: obtunded, GCS<8   Patient assessed or the symptoms described in the history of present illness.  In the context of the Global COVID-19 pandemic, which necessitated consideration that the patient might be at risk for infection with the SARS-CoV-2 virus that causes COVID-19, Institutional protocols and algorithms that pertain to the evaluation of  patients at risk for COVID-19 are in a state of rapid change based on information released by regulatory bodies including the CDC and federal and state organizations. These policies and algorithms were followed during the patient's care while in hospital.    MEDICATIONS: I have reviewed all medications and confirmed regimen as documented   CULTURE RESULTS   Recent Results (from the past 240 hour(s))  Blood Culture (routine x 2)     Status: None   Collection Time: 02/12/20  8:15 AM   Specimen: BLOOD RIGHT FOREARM  Result Value Ref Range Status   Specimen Description BLOOD RIGHT FOREARM  Final   Special Requests   Final    BOTTLES DRAWN AEROBIC AND ANAEROBIC Blood Culture adequate volume   Culture   Final    NO GROWTH 5 DAYS Performed at [facility], [place] [address] [place], [place] [address]    Report Status 02/17/2020 FINAL  Final  Blood Culture (routine x 2)     Status: None   Collection Time: 02/12/20  9:07 AM   Specimen: BLOOD RIGHT ARM  Result Value Ref Range Status   Specimen Description BLOOD RIGHT ARM  Final   Special Requests   Final    BOTTLES DRAWN AEROBIC AND ANAEROBIC Blood Culture adequate volume   Culture   Final    NO GROWTH 5 DAYS Performed at [facility], [place] [address] [place], [place] [address]    Report Status 02/17/2020 FINAL  Final  Culture, blood (Routine X 2) w Reflex to ID Panel     Status: None (Preliminary result)   Collection Time: 02/27/2020  8:28 PM   Specimen: BLOOD  Result Value Ref Range Status   Specimen Description BLOOD RIGHT ANTECUBITAL  Final   Special Requests   Final    BOTTLES DRAWN AEROBIC AND ANAEROBIC Blood Culture results may not be optimal due to an excessive volume of blood received in culture bottles   Culture   Final    NO GROWTH 4 DAYS Performed at [facility], [address] [place], [place] [address]    Report Status PENDING  Incomplete  Culture, blood (Routine X 2) w Reflex to ID Panel      Status: None (Preliminary result)   Collection Time: 02/23/2020  8:30 PM   Specimen: BLOOD  Result Value Ref Range Status   Specimen Description BLOOD BLOOD RIGHT HAND  Final   Special Requests   Final    BOTTLES DRAWN AEROBIC AND ANAEROBIC Blood Culture adequate volume   Culture   Final    NO GROWTH 4 DAYS Performed at [facility], [address], [place],  [address]    Report Status PENDING  Incomplete  MRSA PCR Screening     Status: None   Collection Time: 02/24/2020 11:42 PM   Specimen: Nasopharyngeal  Result Value Ref Range Status   MRSA  by PCR NEGATIVE NEGATIVE Final    Comment:        The GeneXpert MRSA Assay (FDA approved for NASAL specimens only), is one component of a comprehensive MRSA colonization surveillance program. It is not intended to diagnose MRSA infection nor to guide or monitor treatment for MRSA infections. Performed at [facility], [address] [place], [place] [address]           IMAGING    DG Chest Port 1 View  Result Date: 02/22/2020 CLINICAL DATA:  Acute respiratory failure EXAM: PORTABLE CHEST 1 VIEW COMPARISON:  Yesterday FINDINGS: Cardiomegaly and vascular pedicle widening. Endotracheal tube with tip halfway between the clavicular heads and carina. Dialysis catheter with tip at the upper cavoatrial junction. Enteric tube with tip at the distal stomach. Confluent interstitial and airspace opacity. Lung volumes have possibly improved. CABG. IMPRESSION: 1. Stable hardware positioning. 2. Possible improvement in lung volumes. Pulmonary opacification remains extensive. Electronically Signed   By: Monte Fantasia M.D.   On: 02/22/2020 07:16     Nutrition Status:           Indwelling Urinary Catheter continued, requirement due to   Reason to continue Indwelling Urinary Catheter strict Intake/Output monitoring for hemodynamic instability   Central Line/ continued, requirement due to  Reason to continue [place] of central venous pressure or other hemodynamic parameters and poor IV access   Ventilator continued, requirement due to severe respiratory failure   Ventilator Sedation RASS 0 to -2      ASSESSMENT AND PLAN SYNOPSIS   Acute hypoxic respiratory failure secondary to COVID-19 pneumonia  With Multiorgan failure, very poor prognosis Severe end stage ischemic cardiomyopathy and sCHF complicated by acute rena failure Multiorgan failure, very poor prognosis    Acute hypoxemic respiratory failure due to COVID-19 pneumonia / ARDS Mechanical ventilation via ARDS protocol, target PRVC 6 cc/kg Wean PEEP and FiO2 as able Goal plateau pressure less than 30, driving pressure less than 15 Paralytics if necessary for vent synchrony, gas exchange Cycle prone positioning-unable to prone due to high risk for cardiac arrest Deep sedation per PAD protocol, goal RASS -4,  VAP prevention order set Remdesivir FAMILY REFUSED THERAPY IV STEROIDS    Severe ACUTE Hypoxic and Hypercapnic Respiratory Failure -continue Full MV support -continue Bronchodilator Therapy -Wean Fio2 and PEEP as tolerated -VAP/VENT bundle implementation  ACUTE SYSTOLIC CARDIAC FAILURE- EF 30% -oxygen as needed -Lasix as tolerated -follow up cardiac enzymes as indicated -follow up cardiology recs   Morbid obesity, possible OSA.   Will certainly impact respiratory mechanics, ventilator weaning Suspect will need to consider additional PEEP   ACUTE KIDNEY INJURY/Renal Failure -continue Foley Catheter-assess need -Avoid nephrotoxic agents -Follow urine output, BMP -Ensure adequate renal perfusion, optimize oxygenation -Renal dose medications HD as needed and tolerated    NEUROLOGY Acute toxic metabolic encephalopathy, need for sedation Goal RASS -2 to -3  SHOCK-SEPSIS and CARDIOGENIC -use vasopressors to keep MAP>65 -follow ABG and LA -follow up cultures -emperic ABX -consider stress dose  steroids  CARDIAC ICU monitoring  ID -continue IV abx as prescibed -follow up cultures  GI GI PROPHYLAXIS as indicated   DIET-->TF's as tolerated Constipation protocol as indicated  ENDO - will use ICU hypoglycemic\Hyperglycemia protocol if indicated     ELECTROLYTES -follow labs as needed -replace as needed -pharmacy consultation and following   DVT/GI PRX ordered and assessed TRANSFUSIONS AS NEEDED MONITOR FSBS I Assessed the need for Labs I Assessed the need for Foley  I Assessed the need for Central Venous Line Family Discussion when available I Assessed the need for Mobilization I made an Assessment of medications to be adjusted accordingly Safety Risk assessment completed   CASE DISCUSSED IN MULTIDISCIPLINARY ROUNDS WITH ICU TEAM  Critical Care Time devoted to patient care services described in this note is 65 minutes.   Overall, patient is critically ill, prognosis is guarded.  Patient with Multiorgan failure and at high risk for cardiac arrest and death.    Recommend DNR status SOFA SCORE predicts >95% mortality   Corrin Parker, M.D.  [facility]  Medical Director [place] Director [facility] Cardio-Pulmonary Department

## 2020-02-23 ENCOUNTER — Inpatient Hospital Stay: Payer: Medicare HMO

## 2020-02-23 DIAGNOSIS — N179 Acute kidney failure, unspecified: Secondary | ICD-10-CM

## 2020-02-23 DIAGNOSIS — N189 Chronic kidney disease, unspecified: Secondary | ICD-10-CM

## 2020-02-23 LAB — COMPREHENSIVE METABOLIC PANEL
ALT: 29 U/L (ref 0–44)
AST: 67 U/L — ABNORMAL HIGH (ref 15–41)
Albumin: 2.4 g/dL — ABNORMAL LOW (ref 3.5–5.0)
Alkaline Phosphatase: 86 U/L (ref 38–126)
Anion gap: 17 — ABNORMAL HIGH (ref 5–15)
BUN: 100 mg/dL — ABNORMAL HIGH (ref 8–23)
CO2: 22 mmol/L (ref 22–32)
Calcium: 7.8 mg/dL — ABNORMAL LOW (ref 8.9–10.3)
Chloride: 97 mmol/L — ABNORMAL LOW (ref 98–111)
Creatinine, Ser: 6.95 mg/dL — ABNORMAL HIGH (ref 0.61–1.24)
GFR, Estimated: 8 mL/min — ABNORMAL LOW (ref >60)
Glucose, Bld: 166 mg/dL — ABNORMAL HIGH (ref 70–99)
Potassium: 5.5 mmol/L — ABNORMAL HIGH (ref 3.5–5.1)
Sodium: 136 mmol/L (ref 135–145)
Total Bilirubin: 1.1 mg/dL (ref 0.3–1.2)
Total Protein: 6.8 g/dL (ref 6.5–8.1)

## 2020-02-23 LAB — CULTURE, BLOOD (ROUTINE X 2)
Culture: NO GROWTH
Culture: NO GROWTH
Special Requests: ADEQUATE

## 2020-02-23 LAB — CBC WITH DIFFERENTIAL/PLATELET
Abs Immature Granulocytes: 0.21 10*3/uL — ABNORMAL HIGH (ref 0.00–0.07)
Basophils Absolute: 0 10*3/uL (ref 0.0–0.1)
Basophils Relative: 0 %
Eosinophils Absolute: 0 10*3/uL (ref 0.0–0.5)
Eosinophils Relative: 0 %
HCT: 44.6 % (ref 39.0–52.0)
Hemoglobin: 15.2 g/dL (ref 13.0–17.0)
Immature Granulocytes: 1 %
Lymphocytes Relative: 4 %
Lymphs Abs: 0.7 10*3/uL (ref 0.7–4.0)
MCH: 28.4 pg (ref 26.0–34.0)
MCHC: 34.1 g/dL (ref 30.0–36.0)
MCV: 83.2 fL (ref 80.0–100.0)
Monocytes Absolute: 1.1 10*3/uL — ABNORMAL HIGH (ref 0.1–1.0)
Monocytes Relative: 6 %
Neutro Abs: 17.2 10*3/uL — ABNORMAL HIGH (ref 1.7–7.7)
Neutrophils Relative %: 89 %
Platelets: 199 10*3/uL (ref 150–400)
RBC: 5.36 MIL/uL (ref 4.22–5.81)
RDW: 15.9 % — ABNORMAL HIGH (ref 11.5–15.5)
WBC: 19.2 10*3/uL — ABNORMAL HIGH (ref 4.0–10.5)
nRBC: 0 % (ref 0.0–0.2)

## 2020-02-23 LAB — GLUCOSE, CAPILLARY
Glucose-Capillary: 132 mg/dL — ABNORMAL HIGH (ref 70–99)
Glucose-Capillary: 144 mg/dL — ABNORMAL HIGH (ref 70–99)
Glucose-Capillary: 148 mg/dL — ABNORMAL HIGH (ref 70–99)
Glucose-Capillary: 156 mg/dL — ABNORMAL HIGH (ref 70–99)
Glucose-Capillary: 188 mg/dL — ABNORMAL HIGH (ref 70–99)
Glucose-Capillary: 198 mg/dL — ABNORMAL HIGH (ref 70–99)

## 2020-02-23 LAB — PROTIME-INR
INR: 5 (ref 0.8–1.2)
Prothrombin Time: 44.9 seconds — ABNORMAL HIGH (ref 11.4–15.2)

## 2020-02-23 LAB — D-DIMER, QUANTITATIVE: D-Dimer, Quant: 2.71 ug/mL-FEU — ABNORMAL HIGH (ref 0.00–0.50)

## 2020-02-23 LAB — PHOSPHORUS: Phosphorus: 8.6 mg/dL — ABNORMAL HIGH (ref 2.5–4.6)

## 2020-02-23 LAB — HEPATITIS B SURFACE ANTIGEN: Hepatitis B Surface Ag: NONREACTIVE

## 2020-02-23 LAB — MAGNESIUM: Magnesium: 3 mg/dL — ABNORMAL HIGH (ref 1.7–2.4)

## 2020-02-23 LAB — FERRITIN: Ferritin: 1845 ng/mL — ABNORMAL HIGH (ref 24–336)

## 2020-02-23 LAB — C-REACTIVE PROTEIN: CRP: 17.1 mg/dL — ABNORMAL HIGH (ref <1.0)

## 2020-02-23 MED ORDER — NOREPINEPHRINE 4 MG/250ML-% IV SOLN: 0.0000 ug/min | INTRAVENOUS | Status: DC

## 2020-02-23 MED ORDER — VASOPRESSIN 20 UNITS/100 ML INFUSION FOR SHOCK
0.0000 [IU]/min | INTRAVENOUS | Status: DC
  Administered 2020-02-24 – 2020-02-25 (×4): 0.03 [IU]/min via INTRAVENOUS
  Filled 2020-02-23 (×4): qty 100

## 2020-02-23 MED ORDER — VITAL HIGH PROTEIN PO LIQD: 1000.0000 mL | ORAL | Status: DC

## 2020-02-23 MED ORDER — VITAL 1.5 CAL PO LIQD
1000.0000 mL | ORAL | Status: DC
  Administered 2020-02-23 – 2020-02-25 (×3): 1000 mL

## 2020-02-23 MED ORDER — PROSOURCE TF PO LIQD
90.0000 mL | Freq: Four times a day (QID) | ORAL | Status: DC
  Administered 2020-02-23 – 2020-02-26 (×10): 90 mL
  Filled 2020-02-23 (×15): qty 90

## 2020-02-23 NOTE — Progress Notes (Signed)
[place], [place] 02/23/20  Subjective:   Hospital day # 5 Remains critically ill  OXB:[id number]- phenylephrine Pulm: vent assisted Fio 60% Gi: OG in place, clamped Renal: poor UOP- CRRT could not be run due to high dialyzer pressures. INR already high so not option of heparin IHD on 1/10- slow BFR Neuro: sedated with propofol, fentanyl 01/09 0701 - 01/10 0700 In: 1029 [I.V.:1029] Out: 925 [Urine:125; Emesis/NG output:300] Lab Results  Component Value Date   CREATININE 6.95 (H) 02/23/2020   CREATININE 5.19 (H) 02/22/2020   CREATININE 7.43 (H) 02/22/2020     Objective:  Vital signs in last 24 hours:  Temp:  [95.7 F (35.4 C)-99.68 F (37.6 C)] 99.32 F (37.4 C) (01/10 1100) Pulse Rate:  [65-112] 69 (01/10 1100) Resp:  [11-31] 24 (01/10 1100) BP: (69-143)/(44-82) 97/82 (01/10 1100) SpO2:  [86 %-100 %] 94 % (01/10 1100) FiO2 (%):  [60 %-70 %] 60 % (01/10 1120) Weight:  [107.5 kg] 107.5 kg (01/10 0419)  Weight change: -3 kg Filed Weights   02/21/20 0457 02/22/20 0425 02/23/20 0419  Weight: 99.5 kg 110.5 kg 107.5 kg    Intake/Output:    Intake/Output Summary (Last 24 hours) at 02/23/2020 1157 Last data filed at 02/23/2020 1135 Gross per 24 hour  Intake 1028.97 ml  Output 990 ml  Net 38.97 ml    Physical Exam: General:  critically ill, laying in the bed  HEENT  anicteric, ETT, OGT  Pulm/lungs  Vent assisted, coarse  CVS/Heart  regular rhythm, no rub or gallop  Abdomen:   Soft, nondistended  Extremities:  trace peripheral edema, left BKA  Neurologic:  sedated  Skin:  No acute rashes  Right IJ temp cath    Basic Metabolic Panel:  Recent Labs  Lab 02/19/20 0228 02/20/20 0539 02/21/20 0230 02/21/20 1547 02/22/20 0628 02/22/20 1540 02/23/20 0320  NA 132* 127* 130* 131* 132* 135 136  K 4.7 5.3* 5.6* 6.1* 6.2* 4.4 5.5*  CL 95* 92* 92* 93* 95* 95* 97*  CO2 25 20* 22 19* 18* 26 22  GLUCOSE 178* 343* 148* 123* 162* 133*  166*  BUN 96* 121* 140* 138* 126* 76* 100*  CREATININE 2.37* 4.86* 6.89* 6.96* 7.43* 5.19* 6.95*  CALCIUM 8.1* 8.0* 8.3* 8.2* 8.0* 7.8* 7.8*  MG 2.3 2.7* 3.0*  --   --   --  3.0*  PHOS 4.2 6.6* 9.2* 9.1* 10.5* 6.5* 8.6*     CBC: Recent Labs  Lab 02/19/20 0228 02/20/20 0539 02/21/20 0230 02/22/20 0628 02/23/20 0320  WBC 5.8 8.5 16.5* 15.6* 19.2*  NEUTROABS 4.9 7.1 14.8* 14.0* 17.2*  HGB 15.7 15.2 16.3 14.7 15.2  HCT 46.6 44.8 50.1 45.7 44.6  MCV 83.5 83.4 84.9 85.7 83.2  PLT 114* 118* 197 151 199      Lab Results  Component Value Date   HEPBSAG NON REACTIVE 02/22/2020   HEPBSAB NON REACTIVE 02/21/2020   HEPBIGM NON REACTIVE 02/21/2020      Microbiology:  Recent Results (from the past 240 hour(s))  Culture, blood (Routine X 2) w Reflex to ID Panel     Status: None   Collection Time: 02/23/2020  8:28 PM   Specimen: BLOOD  Result Value Ref Range Status   Specimen Description BLOOD RIGHT ANTECUBITAL  Final   Special Requests   Final    BOTTLES DRAWN AEROBIC AND ANAEROBIC Blood Culture results may not be optimal due to an excessive volume of blood received in culture bottles  Culture   Final    NO GROWTH 5 DAYS Performed at [facility], [address] [place], [place] [address]    Report Status 02/23/2020 FINAL  Final  Culture, blood (Routine X 2) w Reflex to ID Panel     Status: None   Collection Time: 02/29/2020  8:30 PM   Specimen: BLOOD  Result Value Ref Range Status   Specimen Description BLOOD BLOOD RIGHT HAND  Final   Special Requests   Final    BOTTLES DRAWN AEROBIC AND ANAEROBIC Blood Culture adequate volume   Culture   Final    NO GROWTH 5 DAYS Performed at [facility], [address] [place], [place] [address]    Report Status 02/23/2020 FINAL  Final  MRSA PCR Screening     Status: None   Collection Time: 03/04/2020 11:42 PM   Specimen: Nasopharyngeal  Result Value Ref Range Status   MRSA by PCR NEGATIVE NEGATIVE Final     Comment:        The GeneXpert MRSA Assay (FDA approved for NASAL specimens only), is one component of a comprehensive MRSA colonization surveillance program. It is not intended to diagnose MRSA infection nor to guide or monitor treatment for MRSA infections. Performed at [facility], [address] [place], [place] [address]     Coagulation Studies: Recent Labs    02/21/20 0545 02/21/20 1547 02/21/20 2139 02/22/20 0628 02/23/20 0635  LABPROT 69.1* 45.3* 51.3* 51.2* 44.9*  INR 8.7* 5.0* 5.9* 5.9* 5.0*    Urinalysis: No results for input(s): COLORURINE, LABSPEC, PHURINE, GLUCOSEU, HGBUR, BILIRUBINUR, KETONESUR, PROTEINUR, UROBILINOGEN, NITRITE, LEUKOCYTESUR in the last 72 hours.  Invalid input(s): APPERANCEUR    Imaging: DG Chest Port 1 View  Result Date: 02/23/2020 CLINICAL DATA:  Acute respiratory failure EXAM: PORTABLE CHEST 1 VIEW COMPARISON:  Yesterday FINDINGS: Endotracheal tube with tip just below the clavicular heads. Enteric tube reaches the stomach. Dialysis catheter with tip at the lower SVC. Improved lung volumes, partially related to differences in positioning. Continued interstitial and reticular opacities which could be from patient's COVID or failure. Left fibrothorax. No pneumothorax. IMPRESSION: 1. Unremarkable hardware positioning. 2. Improved aeration since yesterday. Electronically Signed   By: Monte Fantasia M.D.   On: 02/23/2020 04:06   DG Chest Port 1 View  Result Date: 02/22/2020 CLINICAL DATA:  Acute respiratory failure EXAM: PORTABLE CHEST 1 VIEW COMPARISON:  Yesterday FINDINGS: Cardiomegaly and vascular pedicle widening. Endotracheal tube with tip halfway between the clavicular heads and carina. Dialysis catheter with tip at the upper cavoatrial junction. Enteric tube with tip at the distal stomach. Confluent interstitial and airspace opacity. Lung volumes have possibly improved. CABG. IMPRESSION: 1. Stable hardware positioning. 2. Possible  improvement in lung volumes. Pulmonary opacification remains extensive. Electronically Signed   By: Monte Fantasia M.D.   On: 02/22/2020 07:16     Medications:   . sodium chloride Stopped (02/23/2020 2116)  . sodium chloride Stopped (02/20/20 1126)  . fentaNYL infusion INTRAVENOUS 150 mcg/hr (02/23/20 0914)  . norepinephrine (LEVOPHED) Adult infusion Stopped (02/22/20 2335)  . phenylephrine (NEO-SYNEPHRINE) Adult infusion 110 mcg/min (02/23/20 1132)  . prismasol BGK 2/2.5 dialysis solution 1,500 mL/hr at 02/21/20 1919  . prismasol BGK 2/2.5 replacement solution 300 mL/hr at 02/21/20 1230  . prismasol BGK 2/2.5 replacement solution 300 mL/hr at 02/21/20 1230  . propofol (DIPRIVAN) infusion 15 mcg/kg/min (02/23/20 0915)  . vasopressin     . vitamin C  500 mg Per Tube Daily  .  aspirin  81 mg Per Tube Daily  . atorvastatin  80 mg Per Tube Daily  . chlorhexidine gluconate (MEDLINE KIT)  15 mL Mouth Rinse BID  . Chlorhexidine Gluconate Cloth  6 each Topical Daily  . docusate  100 mg Per Tube BID  . fentaNYL (SUBLIMAZE) injection  25 mcg Intravenous Once  . folic acid  1 mg Per Tube Daily  . hydrocortisone sod succinate (SOLU-CORTEF) inj  50 mg Intravenous Q6H  . insulin aspart  0-20 Units Subcutaneous Q4H  . insulin glargine  20 Units Subcutaneous QHS  . isosorbide mononitrate  30 mg Oral Daily  . mouth rinse  15 mL Mouth Rinse 10 times per day  . metoCLOPramide (REGLAN) injection  5 mg Intravenous Q8H  . multivitamin with minerals  1 tablet Per Tube Daily  . pantoprazole (PROTONIX) IV  40 mg Intravenous Q12H  . polyethylene glycol  17 g Per Tube Daily  . thiamine  100 mg Per Tube Daily  . zinc sulfate  220 mg Per Tube Daily   acetaminophen, chlorpheniramine-HYDROcodone, fentaNYL, fluticasone, gabapentin, guaiFENesin-dextromethorphan, heparin, midazolam, midazolam, morphine injection, sodium chloride  Assessment/ Plan:  65 y.o. male with diabetes type 2, hypertension, hyperlipidemia,  coronary artery disease, obstructive sleep apnea, atrial fibrillation, history of right leg DVT 2015 January,   admitted on 03/03/2020 for COVID-19 [U07.1] STEMI (ST elevation myocardial infarction) ([place]) [I21.3]  1. ARF with hyperkalemia and acidosis Urinalysis from February 12, 2020 small hemoglobin, negative protein, 0-5 RBCs, 0-5 WBCs No renal imaging available at present CRRT started 02/21/2020-d/c 1/9 due to high dialyzer pressure and poor BFR  IHD on 1/10 for hyperkalemia and acidosis    2. Acute hypoxic resp failure -intubated on 1/7 -Requiring ventilator support  -Fio2 60%  3. Covid -19 pneumonia IV remdesivir start 03/12/2020 Solu-Medrol started 02/17/2020      LOS: [place] 1/10/202211:57 AM  [place], [place]  Note: This note was prepared with Dragon dictation. Any transcription errors are unintentional

## 2020-02-23 NOTE — Progress Notes (Signed)
Initial Nutrition Assessment  DOCUMENTATION CODES:   Obesity unspecified  INTERVENTION:  Initiate Vital 1.5 Cal at 20 mL/hr and advance by 10 mL/hr every 8 hours to goal rate of 40 mL/hr (960 mL goal daily volume). Also provide PROSource TF 90 mL QID per tube. Provides 1760 kcal, 153 grams of protein, 730 mL H2O daily per tube. With current propofol rate provides 2021 kcal daily.  Provide MVI daily per tube.  NUTRITION DIAGNOSIS:   Inadequate oral intake related to inability to eat as evidenced by NPO status.  GOAL:   Patient will meet greater than or equal to 90% of their needs  MONITOR:   Vent status,Labs,Weight trends,TF tolerance,I & O's  REASON FOR ASSESSMENT:   Ventilator,Consult Enteral/tube feeding initiation and management  ASSESSMENT:   65 year old male with PMHx of HTN, DM, pulmonary embolism, DVT, COPD, sleep apnea, GERD, diastolic CHF, A-fib, CAD, hx left BKA who was recently diagnosed with COVID-19 on 12/30 and required hospitalization at [facility] from 12/30 to 1/3 now admitted with STEMI and acute hypoxic respiratory failure s/p cardiac catheterization with unsuccessful PCI on 1/5.   1/7 intubated 1/8 started on CRRT  Patient is currently intubated on ventilator support MV: 13 L/min Temp (24hrs), Avg:97.9 F (36.6 C), Min:95.7 F (35.4 C), Max:99.68 F (37.6 C)  Propofol: 9.9 ml/hr (261 kcal daily)  Medications reviewed and include: vitamin C 500 mg daily, Colace 151 mg BID, folic acid 1 mg daily, Solu-Cortef 50 mg q6hrs IV, Novolog 0-20 units Q4hrs, Lantus 20 units QHS, Reglan 5 mg Q8hrs IV, MVI daily, Protonix, Miralax, thiamine 100 mg daily, zinc sulfate 220 mg daily, fentanyl gtt, phenylephrine gtt at 110 mcg/min, propofol gtt.  Labs reviewed: CBG 132-156, Potassium 5.5, Chloride 97, BUN 100, Creatinine 6.95, Phosphorus 8.6, Magnesium 3.  I/O: 125 mL UOP Yesterday  Enteral Access: OGT placed 1/8; terminates in stomach per abdominal x-ray  1/8  Patient does not meet criteria for malnutrition at this time.  Discussed with RN and on rounds. Plan is to start tube feeds today. CRRT can't be run due to high dialyzer pressures. Plan for intermittent HD.  NUTRITION - FOCUSED PHYSICAL EXAM:  Flowsheet Row Most Recent Value  Orbital Region No depletion  Upper Arm Region No depletion  Thoracic and Lumbar Region No depletion  Buccal Region Unable to assess  Temple Region No depletion  Clavicle Bone Region No depletion  Clavicle and Acromion Bone Region No depletion  Scapular Bone Region Unable to assess  Dorsal Hand No depletion  Patellar Region No depletion  Anterior Thigh Region No depletion  Posterior Calf Region Mild depletion  [mild wasting to right calf,  s/p left BKA]  Edema (RD Assessment) --  [non pitting]  Hair Reviewed  Eyes Unable to assess  Mouth Unable to assess  Skin Reviewed  Nails Reviewed     Diet Order:   Diet Order            Diet NPO time specified  Diet effective now                EDUCATION NEEDS:   No education needs have been identified at this time  Skin:  Skin Assessment: Skin Integrity Issues: Skin Integrity Issues:: Incisions Incisions: closed incision right groin  Last BM:  Unknown  Height:   Ht Readings from Last 1 Encounters:  03/06/2020 5\' 11"  (1.803 m)   Weight:   Wt Readings from Last 1 Encounters:  02/23/20 107.5 kg  Ideal Body Weight:  78.2 kg  BMI:  Body mass index is 33.05 kg/m.  Estimated Nutritional Needs:   Kcal:  5462  Protein:  150-160 grams  Fluid:  2 L/day  Jacklynn Barnacle, MS, RD, LDN Pager number available on Amion

## 2020-02-23 NOTE — Progress Notes (Addendum)
GOALS OF CARE DISCUSSION  The Clinical status was relayed to family in detail. Wife Amy Ryser  Updated and notified of patients medical condition.  Patient remains unresponsive and will not open eyes to command.    Patient is having a weak cough and struggling to remove secretions.    patient with increased WOB and using accessory muscles to breathe Explained to family course of therapy and the modalities     Patient with Progressive multiorgan failure with very low chance of meaningful recovery despite all aggressive and optimal medical therapy. Patient is in the Dying  Process associated with Suffering.  Family understands the situation.  Remains Full CODE  Family are satisfied with Plan of action and management. All questions answered  Additional CC time 32 mins   Jaelah Hauth Patricia Pesa, M.D.  [facility]  Medical Director [place] Director [facility] Cardio-Pulmonary Department

## 2020-02-23 NOTE — Progress Notes (Signed)
Okay I will have my office call him  Progress Note  Patient Name: Bobby Lindsey Date of Encounter: 02/23/2020  [facility] Cardiologist: Minus Breeding, MD   Subjective   Intubated for airway protection, respiratory distress, increased work of breathing FiO2 24% On phenylephrine for pressure support CRRT per nephrology Nurses report making increased urine today Not measured in the notes  Episode of atrial fibrillation last night, back in normal sinus rhythm  Inpatient Medications    Scheduled Meds: . vitamin C  500 mg Per Tube Daily  . aspirin  81 mg Per Tube Daily  . atorvastatin  80 mg Per Tube Daily  . chlorhexidine gluconate (MEDLINE KIT)  15 mL Mouth Rinse BID  . Chlorhexidine Gluconate Cloth  6 each Topical Daily  . docusate  100 mg Per Tube BID  . feeding supplement (PROSource TF)  90 mL Per Tube QID  . feeding supplement (VITAL 1.5 CAL)  1,000 mL Per Tube Q24H  . fentaNYL (SUBLIMAZE) injection  25 mcg Intravenous Once  . folic acid  1 mg Per Tube Daily  . hydrocortisone sod succinate (SOLU-CORTEF) inj  50 mg Intravenous Q6H  . insulin aspart  0-20 Units Subcutaneous Q4H  . insulin glargine  20 Units Subcutaneous QHS  . isosorbide mononitrate  30 mg Oral Daily  . mouth rinse  15 mL Mouth Rinse 10 times per day  . metoCLOPramide (REGLAN) injection  5 mg Intravenous Q8H  . multivitamin with minerals  1 tablet Per Tube Daily  . pantoprazole (PROTONIX) IV  40 mg Intravenous Q12H  . polyethylene glycol  17 g Per Tube Daily  . thiamine  100 mg Per Tube Daily  . zinc sulfate  220 mg Per Tube Daily   Continuous Infusions: . sodium chloride Stopped (03/06/2020 2116)  . sodium chloride Stopped (02/20/20 1126)  . fentaNYL infusion INTRAVENOUS 100 mcg/hr (02/23/20 1400)  . norepinephrine (LEVOPHED) Adult infusion Stopped (02/22/20 2335)  . phenylephrine (NEO-SYNEPHRINE) Adult infusion 110 mcg/min (02/23/20 1400)  . prismasol BGK 2/2.5 dialysis solution 1,500 mL/hr at  02/21/20 1919  . prismasol BGK 2/2.5 replacement solution 300 mL/hr at 02/21/20 1230  . prismasol BGK 2/2.5 replacement solution 300 mL/hr at 02/21/20 1230  . propofol (DIPRIVAN) infusion 15 mcg/kg/min (02/23/20 1400)  . vasopressin     PRN Meds: acetaminophen, chlorpheniramine-HYDROcodone, fentaNYL, fluticasone, gabapentin, guaiFENesin-dextromethorphan, heparin, midazolam, midazolam, morphine injection, sodium chloride   Vital Signs    Vitals:   02/23/20 1400 02/23/20 1500 02/23/20 1600 02/23/20 1700  BP: 110/71 128/68 140/64 123/69  Pulse: (!) 48 60 60 (!) 59  Resp: (!) 24 (!) 24 (!) 24 (!) 24  Temp: 98.96 F (37.2 C) 98.96 F (37.2 C) 98.96 F (37.2 C) 98.78 F (37.1 C)  TempSrc:      SpO2: 98% 95% 96% 92%  Weight:      Height:        Intake/Output Summary (Last 24 hours) at 02/23/2020 1840 Last data filed at 02/23/2020 1400 Gross per 24 hour  Intake 1317.82 ml  Output 490 ml  Net 827.82 ml   Last 3 Weights 02/23/2020 02/22/2020 02/21/2020  Weight (lbs) 236 lb 15.9 oz 243 lb 9.7 oz 219 lb 5.7 oz  Weight (kg) 107.5 kg 110.5 kg 99.5 kg      Telemetry    Normal sinus rhythm- Personally Reviewed  ECG     - Personally Reviewed  Physical Exam   Constitutional: Intubated sedated HENT:  Head: Grossly normal Eyes:  no  discharge. No scleral icterus.  Neck: No JVD, no carotid bruits  Cardiovascular: Regular rate and rhythm, no murmurs appreciated Pulmonary/Chest: Coarse breath sounds bilaterally Abdominal: Soft.  no distension.  no tenderness.  Musculoskeletal:  unable to test Neurological: Unable to test Skin: Skin warm and dry Psychiatric: Sedated    Labs    High Sensitivity Troponin:   Recent Labs  Lab 02/11/20 1810 02/11/20 2055 03/07/2020 1713 03/15/2020 2028  TROPONINIHS 20* 20* 4,704* 5,901*     Chemistry Recent Labs  Lab 02/21/20 0230 02/21/20 1547 02/22/20 0628 02/22/20 1540 02/23/20 0320  NA 130*   < > 132* 135 136  K 5.6*   < > 6.2* 4.4 5.5*   CL 92*   < > 95* 95* 97*  CO2 22   < > 18* 26 22  GLUCOSE 148*   < > 162* 133* 166*  BUN 140*   < > 126* 76* 100*  CREATININE 6.89*   < > 7.43* 5.19* 6.95*  CALCIUM 8.3*   < > 8.0* 7.8* 7.8*  PROT 7.5  --  6.5  --  6.8  ALBUMIN 2.9*   < > 2.4* 2.6* 2.4*  AST 68*  --  57*  --  67*  ALT 38  --  30  --  29  ALKPHOS 84  --  80  --  86  BILITOT 1.2  --  1.3*  --  1.1  GFRNONAA 8*   < > 8* 12* 8*  ANIONGAP 16*   < > 19* 14 17*   < > = values in this interval not displayed.     Hematology Recent Labs  Lab 02/21/20 0230 02/22/20 0628 02/23/20 0320  WBC 16.5* 15.6* 19.2*  RBC 5.90* 5.33 5.36  HGB 16.3 14.7 15.2  HCT 50.1 45.7 44.6  MCV 84.9 85.7 83.2  MCH 27.6 27.6 28.4  MCHC 32.5 32.2 34.1  RDW 15.1 15.8* 15.9*  PLT 197 151 199    BNPNo results for input(s): BNP, PROBNP in the last 168 hours.   DDimer  Recent Labs  Lab 02/21/20 0344 02/22/20 0628 02/23/20 0320  DDIMER 17.37* 6.27* 2.71*     Radiology    DG Chest Port 1 View  Result Date: 02/23/2020 CLINICAL DATA:  Acute respiratory failure EXAM: PORTABLE CHEST 1 VIEW COMPARISON:  Yesterday FINDINGS: Endotracheal tube with tip just below the clavicular heads. Enteric tube reaches the stomach. Dialysis catheter with tip at the lower SVC. Improved lung volumes, partially related to differences in positioning. Continued interstitial and reticular opacities which could be from patient's COVID or failure. Left fibrothorax. No pneumothorax. IMPRESSION: 1. Unremarkable hardware positioning. 2. Improved aeration since yesterday. Electronically Signed   By: Monte Fantasia M.D.   On: 02/23/2020 04:06   DG Chest Port 1 View  Result Date: 02/22/2020 CLINICAL DATA:  Acute respiratory failure EXAM: PORTABLE CHEST 1 VIEW COMPARISON:  Yesterday FINDINGS: Cardiomegaly and vascular pedicle widening. Endotracheal tube with tip halfway between the clavicular heads and carina. Dialysis catheter with tip at the upper cavoatrial junction.  Enteric tube with tip at the distal stomach. Confluent interstitial and airspace opacity. Lung volumes have possibly improved. CABG. IMPRESSION: 1. Stable hardware positioning. 2. Possible improvement in lung volumes. Pulmonary opacification remains extensive. Electronically Signed   By: Monte Fantasia M.D.   On: 02/22/2020 07:16   DG Abd Portable 1V  Result Date: 02/23/2020 CLINICAL DATA:  Constipation EXAM: PORTABLE ABDOMEN - 1 VIEW COMPARISON:  February 21, 2020 FINDINGS: Nasogastric  tube tip and side port in stomach. There is moderate stool in the colon. There is no bowel dilatation or air-fluid level to suggest bowel obstruction. No free air. Foci of vascular calcification noted in the pelvis. IMPRESSION: Moderate stool in colon. No bowel obstruction or free air. Nasogastric tube tip and side port in stomach. Electronically Signed   By: Lowella Grip III M.D.   On: 02/23/2020 14:39    Cardiac Studies   Echocardiogram pending  Cardiac catheterization  Prox LAD lesion is 100% stenosed.  Prox Cx to Mid Cx lesion is 100% stenosed.  Prox RCA lesion is 99% stenosed.  Dist RCA lesion is 100% stenosed.  Ost RCA to Prox RCA lesion is 50% stenosed.  Prox RCA to Mid RCA lesion is 75% stenosed.  Ost Cx to Prox Cx lesion is 50% stenosed.  Origin lesion is 100% stenosed.  Origin to Dist Graft lesion is 100% stenosed.  Origin lesion is 100% stenosed.  Mid LAD to Dist LAD lesion is 50% stenosed.      Patient Profile     65 y.o. male presenting with STEMI also with past medical history as below Multivessel coronary disease Coronary bypass surgeryx4 , 2002 History of multiple PCI and stents most recently 2015 Obesity Vascular disease Diabetes Hypertension Hyperlipidemia Covid pneumonia with hypoxemia Hypoxemia Chronic renal insufficiency  Assessment & Plan    STEMI Catheterization February 18, 2020  all grafts are occluded, LIMA is patent to the LAD Attempt to open vein  graft to the diagonal unsuccessful after numerous attempts, ballooning LV gram during catheterization not performed to limit dye load Repeat echocardiogram not performed, was held by technicians Tolerating aspirin 81 mg daily, Lipitor 80 Plavix held for supratherapeutic INR We will hold isosorbide given he is on pressors  Acute on chronic renal failure History of diabetic nephropathy, diabetes poorly controlled Likely ATN, contrast nephropathy, Receiving CRRT, nephrology following  COVID pneumonia Recent hospital admission [facility], readmitted here with STEMI 2 days after discharge On broad-spectrum antibiotics Steroids Intubated in the setting of renal failure  Cardiomyopathy, ischemic Known ejection fraction 45%, repeat echocardiogram was put on hold, may need new order  Unable to add ARB, ACE, Entresto given renal failure Most medications held for now as he is on pressors Given renal dysfunction/ATN, will avoid hypotension.    Hyperlipidemia Lipitor 80  Chronic diastolic CHF Not making much urine, on CRRT  Atrial flutter As outpatient is on warfarin Prior cardioversion, maintaining normal sinus rhythm Has had supratherapeutic INR  Diabetes type 2 poorly controlled Ranging from 9 to 11 on his A1c On steroids for COVID  Complicated  critically ill Discussed with nursing  Total encounter time more than 35 minutes  Greater than 50% was spent in counseling and coordination of care with the patient   For questions or updates, please contact [facility] Please consult [email or web address] for contact info under        Signed, Ida Rogue, MD  02/23/2020, 6:40 PM

## 2020-02-23 NOTE — Progress Notes (Signed)
[place], [place] 02/23/20  Subjective:   Hospital day # 5 Remains critically ill  TMH:DQQIWLNL- nor epi Pulm: vent assisted Fio 70% Gi: OG in place, clamped Renal: poor UOP- CRRT could not be run due to high dialyzer pressures. INR already high, heparin not an option Neuro: sedated with propofol, fentanyl 01/09 0701 - 01/10 0700 In: 1029 [I.V.:1029] Out: 925 [Urine:125; Emesis/NG output:300] Lab Results  Component Value Date   CREATININE 6.95 (H) 02/23/2020   CREATININE 5.19 (H) 02/22/2020   CREATININE 7.43 (H) 02/22/2020     Objective:  Vital signs in last 24 hours:  Temp:  [95.7 F (35.4 C)-99.68 F (37.6 C)] 99.32 F (37.4 C) (01/10 1100) Pulse Rate:  [65-112] 69 (01/10 1100) Resp:  [11-31] 24 (01/10 1100) BP: (69-143)/(44-82) 97/82 (01/10 1100) SpO2:  [86 %-100 %] 94 % (01/10 1100) FiO2 (%):  [60 %-70 %] 60 % (01/10 1120) Weight:  [107.5 kg] 107.5 kg (01/10 0419)  Weight change: -3 kg Filed Weights   02/21/20 0457 02/22/20 0425 02/23/20 0419  Weight: 99.5 kg 110.5 kg 107.5 kg    Intake/Output:    Intake/Output Summary (Last 24 hours) at 02/23/2020 1155 Last data filed at 02/23/2020 1135 Gross per 24 hour  Intake 1028.97 ml  Output 990 ml  Net 38.97 ml    Physical Exam: General:  critically ill, laying in the bed  HEENT  anicteric, ETT, OGT  Pulm/lungs  Vent assisted, coarse  CVS/Heart  regular rhythm, no rub or gallop  Abdomen:   Soft, nontender  Extremities:  trace peripheral edema, left BKA  Neurologic:  sedated  Skin:  No acute rashes  Right IJ temp cath    Basic Metabolic Panel:  Recent Labs  Lab 02/19/20 0228 02/20/20 0539 02/21/20 0230 02/21/20 1547 02/22/20 0628 02/22/20 1540 02/23/20 0320  NA 132* 127* 130* 131* 132* 135 136  K 4.7 5.3* 5.6* 6.1* 6.2* 4.4 5.5*  CL 95* 92* 92* 93* 95* 95* 97*  CO2 25 20* 22 19* 18* 26 22  GLUCOSE 178* 343* 148* 123* 162* 133* 166*  BUN 96* 121* 140* 138* 126*  76* 100*  CREATININE 2.37* 4.86* 6.89* 6.96* 7.43* 5.19* 6.95*  CALCIUM 8.1* 8.0* 8.3* 8.2* 8.0* 7.8* 7.8*  MG 2.3 2.7* 3.0*  --   --   --  3.0*  PHOS 4.2 6.6* 9.2* 9.1* 10.5* 6.5* 8.6*     CBC: Recent Labs  Lab 02/19/20 0228 02/20/20 0539 02/21/20 0230 02/22/20 0628 02/23/20 0320  WBC 5.8 8.5 16.5* 15.6* 19.2*  NEUTROABS 4.9 7.1 14.8* 14.0* 17.2*  HGB 15.7 15.2 16.3 14.7 15.2  HCT 46.6 44.8 50.1 45.7 44.6  MCV 83.5 83.4 84.9 85.7 83.2  PLT 114* 118* 197 151 199      Lab Results  Component Value Date   HEPBSAG NON REACTIVE 02/22/2020   HEPBSAB NON REACTIVE 02/21/2020   HEPBIGM NON REACTIVE 02/21/2020      Microbiology:  Recent Results (from the past 240 hour(s))  Culture, blood (Routine X 2) w Reflex to ID Panel     Status: None   Collection Time: 02/17/2020  8:28 PM   Specimen: BLOOD  Result Value Ref Range Status   Specimen Description BLOOD RIGHT ANTECUBITAL  Final   Special Requests   Final    BOTTLES DRAWN AEROBIC AND ANAEROBIC Blood Culture results may not be optimal due to an excessive volume of blood received in culture bottles   Culture   Final  NO GROWTH 5 DAYS Performed at [facility], [address] [place], [place] [address]    Report Status 02/23/2020 FINAL  Final  Culture, blood (Routine X 2) w Reflex to ID Panel     Status: None   Collection Time: 03/02/2020  8:30 PM   Specimen: BLOOD  Result Value Ref Range Status   Specimen Description BLOOD BLOOD RIGHT HAND  Final   Special Requests   Final    BOTTLES DRAWN AEROBIC AND ANAEROBIC Blood Culture adequate volume   Culture   Final    NO GROWTH 5 DAYS Performed at [facility], [address] [place], [place] [address]    Report Status 02/23/2020 FINAL  Final  MRSA PCR Screening     Status: None   Collection Time: 02/28/2020 11:42 PM   Specimen: Nasopharyngeal  Result Value Ref Range Status   MRSA by PCR NEGATIVE NEGATIVE Final    Comment:        The GeneXpert MRSA  Assay (FDA approved for NASAL specimens only), is one component of a comprehensive MRSA colonization surveillance program. It is not intended to diagnose MRSA infection nor to guide or monitor treatment for MRSA infections. Performed at [facility], [address] [place], [place] [address]     Coagulation Studies: Recent Labs    02/21/20 0545 02/21/20 1547 02/21/20 2139 02/22/20 0628 02/23/20 0635  LABPROT 69.1* 45.3* 51.3* 51.2* 44.9*  INR 8.7* 5.0* 5.9* 5.9* 5.0*    Urinalysis: No results for input(s): COLORURINE, LABSPEC, PHURINE, GLUCOSEU, HGBUR, BILIRUBINUR, KETONESUR, PROTEINUR, UROBILINOGEN, NITRITE, LEUKOCYTESUR in the last 72 hours.  Invalid input(s): APPERANCEUR    Imaging: DG Chest Port 1 View  Result Date: 02/23/2020 CLINICAL DATA:  Acute respiratory failure EXAM: PORTABLE CHEST 1 VIEW COMPARISON:  Yesterday FINDINGS: Endotracheal tube with tip just below the clavicular heads. Enteric tube reaches the stomach. Dialysis catheter with tip at the lower SVC. Improved lung volumes, partially related to differences in positioning. Continued interstitial and reticular opacities which could be from patient's COVID or failure. Left fibrothorax. No pneumothorax. IMPRESSION: 1. Unremarkable hardware positioning. 2. Improved aeration since yesterday. Electronically Signed   By: Monte Fantasia M.D.   On: 02/23/2020 04:06   DG Chest Port 1 View  Result Date: 02/22/2020 CLINICAL DATA:  Acute respiratory failure EXAM: PORTABLE CHEST 1 VIEW COMPARISON:  Yesterday FINDINGS: Cardiomegaly and vascular pedicle widening. Endotracheal tube with tip halfway between the clavicular heads and carina. Dialysis catheter with tip at the upper cavoatrial junction. Enteric tube with tip at the distal stomach. Confluent interstitial and airspace opacity. Lung volumes have possibly improved. CABG. IMPRESSION: 1. Stable hardware positioning. 2. Possible improvement in lung volumes.  Pulmonary opacification remains extensive. Electronically Signed   By: Monte Fantasia M.D.   On: 02/22/2020 07:16     Medications:   . sodium chloride Stopped (03/04/2020 2116)  . sodium chloride Stopped (02/20/20 1126)  . fentaNYL infusion INTRAVENOUS 150 mcg/hr (02/23/20 0914)  . norepinephrine (LEVOPHED) Adult infusion Stopped (02/22/20 2335)  . phenylephrine (NEO-SYNEPHRINE) Adult infusion 110 mcg/min (02/23/20 1132)  . prismasol BGK 2/2.5 dialysis solution 1,500 mL/hr at 02/21/20 1919  . prismasol BGK 2/2.5 replacement solution 300 mL/hr at 02/21/20 1230  . prismasol BGK 2/2.5 replacement solution 300 mL/hr at 02/21/20 1230  . propofol (DIPRIVAN) infusion 15 mcg/kg/min (02/23/20 0915)  . vasopressin     . vitamin C  500 mg Per Tube Daily  . aspirin  81 mg Per Tube Daily  .  atorvastatin  80 mg Per Tube Daily  . chlorhexidine gluconate (MEDLINE KIT)  15 mL Mouth Rinse BID  . Chlorhexidine Gluconate Cloth  6 each Topical Daily  . docusate  100 mg Per Tube BID  . fentaNYL (SUBLIMAZE) injection  25 mcg Intravenous Once  . folic acid  1 mg Per Tube Daily  . hydrocortisone sod succinate (SOLU-CORTEF) inj  50 mg Intravenous Q6H  . insulin aspart  0-20 Units Subcutaneous Q4H  . insulin glargine  20 Units Subcutaneous QHS  . isosorbide mononitrate  30 mg Oral Daily  . mouth rinse  15 mL Mouth Rinse 10 times per day  . metoCLOPramide (REGLAN) injection  5 mg Intravenous Q8H  . multivitamin with minerals  1 tablet Per Tube Daily  . pantoprazole (PROTONIX) IV  40 mg Intravenous Q12H  . polyethylene glycol  17 g Per Tube Daily  . thiamine  100 mg Per Tube Daily  . zinc sulfate  220 mg Per Tube Daily   acetaminophen, chlorpheniramine-HYDROcodone, fentaNYL, fluticasone, gabapentin, guaiFENesin-dextromethorphan, heparin, midazolam, midazolam, morphine injection, sodium chloride  Assessment/ Plan:  65 y.o. male with diabetes type 2, hypertension, hyperlipidemia, coronary artery disease,  obstructive sleep apnea, atrial fibrillation, history of right leg DVT 2015 January,   admitted on 02/25/2020 for COVID-19 [U07.1] STEMI (ST elevation myocardial infarction) ([place]) [I21.3]  1. ARF with hyperkalemia and acidosis Urinalysis from February 12, 2020 small hemoglobin, negative protein, 0-5 RBCs, 0-5 WBCs No renal imaging available at present CRRT started 02/21/2020-d/c 1/9 due to high dialyzer pressure and poor BFR Attempt IHD today for hyperkalemia and acidosis Plan for HD on Tuesday Consider Lokelma for  hyperkalemia  2. Acute hypoxic resp failure -intubated on 1/7 -Requiring ventilator support   3. Covid -19 pneumonia IV remdesivir start 02/27/2020 Solu-Medrol started 02/23/2020      LOS: [place] 1/10/202211:55 AM  [place], [place]  Note: This note was prepared with Dragon dictation. Any transcription errors are unintentional

## 2020-02-23 NOTE — Progress Notes (Signed)
OT Cancellation Note  Patient Details Name: Bobby Lindsey MRN: [id number] DOB: 04/12/1955   Cancelled Treatment:    Reason Eval/Treat Not Completed: Medical issues which prohibited therapy;Patient not medically ready. Chart reviewed. Pt noted to be intubated and placed on ventilator as of 1/7. Will sign off at this time. Please re-consult once medically appropriate for therapy.  Jeni Salles, MPH, MS, OTR/L ascom [phone] 02/23/20, 4:32 PM

## 2020-02-23 NOTE — Progress Notes (Signed)
CRITICAL CARE NOTE 65 yo male recently diagnosed with COVID-19 pneumonia on 12/30 which he required hospitalization at [facility] 12/30 to 01/3 now admitted with STEMI and acute hypoxic respiratory failure secondary to COVID-19 pneumonia requiring Bipap s/p cardiac catheterization with unsuccessful PCI and stent placement to SVG per cardiology recommending uninterrupted dual antiplatelet therapy  Cardiac cath revealed multiple occlusions, however PCI and stent placement to the SVG unsuccessful per cardiology recommending uninterrupted dual antiplatelet therapy for a minimum of 6 months.  Pt admitted to ICU post procedure due to Bipap requirement secondary to acute hypoxic respiratory failure in the setting of COVID-19 pneumonia.    Significant Hospital Events:  01/5: Pt admitted to ICU post cardiac cath 1/6 TRANSFERRED TO [facility]  1/7 Progressive resp failure and renal failure, Family made aware and reversed CODE status 1/7 patient was emergently intubated and placed on MV suport 1/7 vasc cath placed in anticipation for CRRT 1/8 severe resp failure, on CRRT, on pressors 1/10 remains on pressors, almost dies last night        CC  follow up respiratory failure  SUBJECTIVE Patient remains critically ill Prognosis is guarded Near cardiac arrest last night multiorgan failure  Vent Mode: PRVC FiO2 (%):  [60 %-100 %] 60 % Set Rate:  [24 bmp] 24 bmp Vt Set:  [550 mL] 550 mL PEEP:  [10 cmH20] 10 cmH20 Plateau Pressure:  [24 cmH20-26 cmH20] 24 cmH20  CBC    Component Value Date/Time   WBC 19.2 (H) 02/23/2020 0320   RBC 5.36 02/23/2020 0320   HGB 15.2 02/23/2020 0320   HGB 14.9 12/01/2019 1618   HCT 44.6 02/23/2020 0320   HCT 44.6 12/01/2019 1618   PLT 199 02/23/2020 0320   PLT 174 12/01/2019 1618   MCV 83.2 02/23/2020 0320   MCV 87 12/01/2019 1618   MCH 28.4 02/23/2020 0320   MCHC 34.1 02/23/2020 0320   RDW 15.9 (H) 02/23/2020 0320   RDW 13.9 12/01/2019 1618    LYMPHSABS 0.7 02/23/2020 0320   LYMPHSABS 2.1 03/31/2015 1100   MONOABS 1.1 (H) 02/23/2020 0320   EOSABS 0.0 02/23/2020 0320   EOSABS 0.2 03/31/2015 1100   BASOSABS 0.0 02/23/2020 0320   BASOSABS 0.0 03/31/2015 1100   BMP Latest Ref Rng & Units 02/23/2020 02/22/2020 02/22/2020  Glucose 70 - 99 mg/dL 166(H) 133(H) 162(H)  BUN 8 - 23 mg/dL 100(H) 76(H) 126(H)  Creatinine 0.61 - 1.24 mg/dL 6.95(H) 5.19(H) 7.43(H)  BUN/Creat Ratio 10 - 24 - - -  Sodium 135 - 145 mmol/L 136 135 132(L)  Potassium 3.5 - 5.1 mmol/L 5.5(H) 4.4 6.2(H)  Chloride 98 - 111 mmol/L 97(L) 95(L) 95(L)  CO2 22 - 32 mmol/L 22 26 18(L)  Calcium 8.9 - 10.3 mg/dL 7.8(L) 7.8(L) 8.0(L)     BP 135/72   Pulse 72   Temp 99.68 F (37.6 C) (Esophageal)   Resp (!) 22   Ht 5\' 11"  (1.803 m)   Wt 107.5 kg   SpO2 100%   BMI 33.05 kg/m    I/O last 3 completed shifts: In: 1453.4 [I.V.:1453.4] Out: 6333 [Urine:150; Emesis/NG output:300; Other:599] No intake/output data recorded.  SpO2: 100 % O2 Flow Rate (L/min): 15 L/min FiO2 (%): 60 %  Estimated body mass index is 33.05 kg/m as calculated from the following:   Height as of this encounter: 5\' 11"  (1.803 m).   Weight as of this encounter: 107.5 kg.  SIGNIFICANT EVENTS   REVIEW OF SYSTEMS  PATIENT IS UNABLE TO PROVIDE  COMPLETE REVIEW OF SYSTEMS DUE TO SEVERE CRITICAL ILLNESS      COVID-19 DISASTER DECLARATION:   FULL CONTACT PHYSICAL EXAMINATION WAS NOT POSSIBLE DUE TO TREATMENT OF COVID-19  AND CONSERVATION OF PERSONAL PROTECTIVE EQUIPMENT, LIMITED EXAM FINDINGS INCLUDE-   PHYSICAL EXAMINATION:  GENERAL:critically ill appearing, +resp distress NEUROLOGIC: obtunded, GCS<8   Patient assessed or the symptoms described in the history of present illness.  In the context of the Global COVID-19 pandemic, which necessitated consideration that the patient might be at risk for infection with the SARS-CoV-2 virus that causes COVID-19, Institutional protocols and  algorithms that pertain to the evaluation of patients at risk for COVID-19 are in a state of rapid change based on information released by regulatory bodies including the CDC and federal and state organizations. These policies and algorithms were followed during the patient's care while in hospital.    MEDICATIONS: I have reviewed all medications and confirmed regimen as documented   CULTURE RESULTS   Recent Results (from the past 240 hour(s))  Culture, blood (Routine X 2) w Reflex to ID Panel     Status: None (Preliminary result)   Collection Time: 03/15/2020  8:28 PM   Specimen: BLOOD  Result Value Ref Range Status   Specimen Description BLOOD RIGHT ANTECUBITAL  Final   Special Requests   Final    BOTTLES DRAWN AEROBIC AND ANAEROBIC Blood Culture results may not be optimal due to an excessive volume of blood received in culture bottles   Culture   Final    NO GROWTH 4 DAYS Performed at [facility], [address], [place], [place] [address]    Report Status PENDING  Incomplete  Culture, blood (Routine X 2) w Reflex to ID Panel     Status: None (Preliminary result)   Collection Time: 02/16/2020  8:30 PM   Specimen: BLOOD  Result Value Ref Range Status   Specimen Description BLOOD BLOOD RIGHT HAND  Final   Special Requests   Final    BOTTLES DRAWN AEROBIC AND ANAEROBIC Blood Culture adequate volume   Culture   Final    NO GROWTH 4 DAYS Performed at [facility], [address], [place], [place] [address]    Report Status PENDING  Incomplete  MRSA PCR Screening     Status: None   Collection Time: 03/09/2020 11:42 PM   Specimen: Nasopharyngeal  Result Value Ref Range Status   MRSA by PCR NEGATIVE NEGATIVE Final    Comment:        The GeneXpert MRSA Assay (FDA approved for NASAL specimens only), is one component of a comprehensive MRSA colonization surveillance program. It is not intended to diagnose MRSA infection nor to guide or monitor treatment for MRSA  infections. Performed at [facility], [address] [place], [place] [address]           IMAGING    DG Chest Port 1 View  Result Date: 02/23/2020 CLINICAL DATA:  Acute respiratory failure EXAM: PORTABLE CHEST 1 VIEW COMPARISON:  Yesterday FINDINGS: Endotracheal tube with tip just below the clavicular heads. Enteric tube reaches the stomach. Dialysis catheter with tip at the lower SVC. Improved lung volumes, partially related to differences in positioning. Continued interstitial and reticular opacities which could be from patient's COVID or failure. Left fibrothorax. No pneumothorax. IMPRESSION: 1. Unremarkable hardware positioning. 2. Improved aeration since yesterday. Electronically Signed   By: Monte Fantasia M.D.   On: 02/23/2020 04:06     Nutrition Status:  Indwelling Urinary Catheter continued, requirement due to   Reason to continue Indwelling Urinary Catheter strict Intake/Output monitoring for hemodynamic instability   Central Line/ continued, requirement due to  Reason to continue [place] of central venous pressure or other hemodynamic parameters and poor IV access   Ventilator continued, requirement due to severe respiratory failure   Ventilator Sedation RASS 0 to -2      ASSESSMENT AND PLAN SYNOPSIS    65 yo with Acute hypoxic respiratory failure secondary to COVID-19 pneumoniaWith Multiorgan failure, very poor prognosis, Severe end stage ischemic cardiomyopathy and sCHF complicated by acute rena failure Multiorgan failure, very poor prognosis     Acute hypoxemic respiratory failure due to COVID-19 pneumonia  Mechanical ventilation via ARDS protocol, target PRVC 6 cc/kg Wean PEEP and FiO2 as able Goal plateau pressure less than 30, driving pressure less than 15 Paralytics if necessary for vent synchrony, gas exchange Cycle prone positioning if necessary for oxygenation Deep sedation per PAD protocol, goal  RASS -4,  VAP prevention order set Remdesivir FAMILY REFUSED THERAPY IV STEROIDS  Check CRP   Severe ACUTE Hypoxic and Hypercapnic Respiratory Failure -continue Full MV support -continue Bronchodilator Therapy -Wean Fio2 and PEEP as tolerated -VAP/VENT bundle implementation  ACUTE SYSTOLIC CARDIAC FAILURE- EF -oxygen as needed -Lasix as tolerated -follow up cardiac enzymes as indicated -follow up cardiology recs   Morbid obesity, possible OSA.   Will certainly impact respiratory mechanics, ventilator weaning Suspect will need to consider additional PEEP   ACUTE KIDNEY INJURY/Renal Failure -continue Foley Catheter-assess need -Avoid nephrotoxic agents -Follow urine output, BMP -Ensure adequate renal perfusion, optimize oxygenation -Renal dose medications HD as tolerated    NEUROLOGY Acute toxic metabolic encephalopathy, need for sedation Goal RASS -2 to -3  SHOCK-SEPSIS-CARDIOGENIC COVID and active MI -use vasopressors to keep MAP>65 -follow ABG and LA -follow up cultures -emperic ABX -consider stress dose steroids   CARDIAC ICU monitoring  ID -continue IV abx as prescibed -follow up cultures Anti-infectives (From admission, onward)   Start     Dose/Rate Route Frequency Ordered Stop   02/21/20 1000  remdesivir 100 mg in sodium chloride 0.9 % 100 mL IVPB  Status:  Discontinued       "Followed by" Linked Group Details   100 mg 200 mL/hr over 30 Minutes Intravenous Daily 02/20/20 0926 02/22/20 0757   02/20/20 1100  remdesivir 200 mg in sodium chloride 0.9% 250 mL IVPB       "Followed by" Linked Group Details   200 mg 580 mL/hr over 30 Minutes Intravenous Once 02/20/20 0926 02/20/20 1156   02/19/20 1000  remdesivir 100 mg in sodium chloride 0.9 % 100 mL IVPB  Status:  Discontinued       "Followed by" Linked Group Details   100 mg 200 mL/hr over 30 Minutes Intravenous Daily 03/06/2020 1916 02/25/2020 1920   02/15/2020 2200  ceFEPIme (MAXIPIME) 2 g in sodium  chloride 0.9 % 100 mL IVPB  Status:  Discontinued        2 g 200 mL/hr over 30 Minutes Intravenous Every 12 hours 02/17/2020 2047 02/19/20 1235   03/10/2020 1915  remdesivir 200 mg in sodium chloride 0.9% 250 mL IVPB  Status:  Discontinued       "Followed by" Linked Group Details   200 mg 580 mL/hr over 30 Minutes Intravenous Once 03/11/2020 1916 03/04/2020 1920       GI GI PROPHYLAXIS as indicated   DIET-->TF's as tolerated Constipation protocol as indicated  ENDO - will use ICU hypoglycemic\Hyperglycemia protocol if indicated     ELECTROLYTES -follow labs as needed -replace as needed -pharmacy consultation and following   DVT/GI PRX ordered and assessed TRANSFUSIONS AS NEEDED MONITOR FSBS I Assessed the need for Labs I Assessed the need for Foley I Assessed the need for Central Venous Line Family Discussion when available I Assessed the need for Mobilization I made an Assessment of medications to be adjusted accordingly Safety Risk assessment completed   CASE DISCUSSED IN MULTIDISCIPLINARY ROUNDS WITH ICU TEAM  Critical Care Time devoted to patient care services described in this note is 45  minutes.   Overall, patient is critically ill, prognosis is guarded.  Patient with Multiorgan failure and at high risk for cardiac arrest and death.   Patient is DNR status  Corrin Parker, M.D.  [facility]  Medical Director [place] Director [facility] Cardio-Pulmonary Department

## 2020-02-23 NOTE — Progress Notes (Signed)
OT Cancellation Note  Patient Details Name: Bobby Lindsey MRN: [id number] DOB: [birth year]   Cancelled Treatment:    Reason Eval/Treat Not Completed: Medical issues which prohibited therapy. Chart reviewed. INR remains critically elevated (5.0) as well as K+ (5.5). Contraindicated for participation in therapy at this time. Will re-attempt at later date/time as medically appropriate.   Jeni Salles, MPH, MS, OTR/L ascom [phone] 02/23/20, 8:28 AM

## 2020-02-23 NOTE — Procedures (Deleted)
Endotracheal Intubation: Patient required placement of an artificial airway secondary to Respiratory Failure  Consent: Verbal Consent   The Risks and Benefits of the INTUBTAION procedure  were explained to family.  I have discussed the risk for Acute Bleeding, increased chance of Infection, increased chance of Respiratory Failure and Cardiac Arrest, increased chance of pneumothorax and collapsed lung, as well as increased Stroke and Death.  The Family understand the risks and benefits and have agreed to proceed with procedure.   Hand washing performed prior to starting the procedure.   Medications administered for sedation prior to procedure:  Midazolam 4 mg IV,  Vecuronium 20 mg IV, Fentanyl 200 mcg IV.    A time out procedure was called and correct patient, name, & ID confirmed. Needed supplies and equipment were assembled and checked to include ETT, 10 ml syringe, Glidescope, Mac and Miller blades, suction, oxygen and bag mask valve, end tidal CO2 monitor.   Patient was positioned to align the mouth and pharynx to facilitate visualization of the glottis.   Heart rate, SpO2 and blood pressure was continuously monitored during the procedure. Pre-oxygenation was conducted prior to intubation and endotracheal tube was placed through the vocal cords into the trachea.     The artificial airway was placed under direct visualization via glidescope route using a 8.5 ETT on the first attempt.  ETT was secured at 23 cm mark.  Placement was confirmed by auscuitation of lungs with good breath sounds bilaterally and no stomach sounds.  Condensation was noted on endotracheal tube.   Pulse ox 98%.  CO2 detector in place with appropriate color change.   Complications: None .   Operator: Alton Tremblay.   Chest radiograph ordered and pending.   Comments: OGT placed via glidescope.  Corrin Parker, M.D.  [facility]  Medical Director [place]  Director [place] Cardio-Pulmonary Department

## 2020-02-23 NOTE — Progress Notes (Signed)
PT Cancellation Note  Patient Details Name: Bobby Lindsey MRN: [id number] DOB: 1955/08/13   Cancelled Treatment:    Reason Eval/Treat Not Completed: Patient not medically ready.  Chart reviewed.  Pt's INR noted to be 5.0 and K+ noted to be 5.5 this morning.  Recent medical concerns noted in chart.  Per PT guidelines for elevated potassium, exertional activity contraindicated.  Will hold PT at this time, monitor pt's status, and re-attempt PT session at a later date/time as medically appropriate.  Leitha Bleak, PT 02/23/20, 8:26 AM

## 2020-02-24 ENCOUNTER — Encounter: Payer: Self-pay | Admitting: Internal Medicine

## 2020-02-24 ENCOUNTER — Inpatient Hospital Stay: Payer: Medicare HMO

## 2020-02-24 DIAGNOSIS — R579 Shock, unspecified: Secondary | ICD-10-CM

## 2020-02-24 LAB — RENAL FUNCTION PANEL
Albumin: 2.4 g/dL — ABNORMAL LOW (ref 3.5–5.0)
Anion gap: 15 (ref 5–15)
BUN: 101 mg/dL — ABNORMAL HIGH (ref 8–23)
CO2: 25 mmol/L (ref 22–32)
Calcium: 7.5 mg/dL — ABNORMAL LOW (ref 8.9–10.3)
Chloride: 95 mmol/L — ABNORMAL LOW (ref 98–111)
Creatinine, Ser: 7.26 mg/dL — ABNORMAL HIGH (ref 0.61–1.24)
GFR, Estimated: 8 mL/min — ABNORMAL LOW (ref >60)
Glucose, Bld: 254 mg/dL — ABNORMAL HIGH (ref 70–99)
Phosphorus: 8.5 mg/dL — ABNORMAL HIGH (ref 2.5–4.6)
Potassium: 5.3 mmol/L — ABNORMAL HIGH (ref 3.5–5.1)
Sodium: 135 mmol/L (ref 135–145)

## 2020-02-24 LAB — COMPREHENSIVE METABOLIC PANEL
ALT: 31 U/L (ref 0–44)
AST: 78 U/L — ABNORMAL HIGH (ref 15–41)
Albumin: 2.3 g/dL — ABNORMAL LOW (ref 3.5–5.0)
Alkaline Phosphatase: 89 U/L (ref 38–126)
Anion gap: 19 — ABNORMAL HIGH (ref 5–15)
BUN: 131 mg/dL — ABNORMAL HIGH (ref 8–23)
CO2: 20 mmol/L — ABNORMAL LOW (ref 22–32)
Calcium: 7.3 mg/dL — ABNORMAL LOW (ref 8.9–10.3)
Chloride: 95 mmol/L — ABNORMAL LOW (ref 98–111)
Creatinine, Ser: 9.18 mg/dL — ABNORMAL HIGH (ref 0.61–1.24)
GFR, Estimated: 6 mL/min — ABNORMAL LOW (ref >60)
Glucose, Bld: 282 mg/dL — ABNORMAL HIGH (ref 70–99)
Potassium: 6.8 mmol/L (ref 3.5–5.1)
Sodium: 134 mmol/L — ABNORMAL LOW (ref 135–145)
Total Bilirubin: 1 mg/dL (ref 0.3–1.2)
Total Protein: 6.8 g/dL (ref 6.5–8.1)

## 2020-02-24 LAB — MAGNESIUM: Magnesium: 3.2 mg/dL — ABNORMAL HIGH (ref 1.7–2.4)

## 2020-02-24 LAB — PROTIME-INR
INR: 3.9 — ABNORMAL HIGH (ref 0.8–1.2)
Prothrombin Time: 37.3 seconds — ABNORMAL HIGH (ref 11.4–15.2)

## 2020-02-24 LAB — CBC WITH DIFFERENTIAL/PLATELET
Abs Immature Granulocytes: 0.29 10*3/uL — ABNORMAL HIGH (ref 0.00–0.07)
Basophils Absolute: 0 10*3/uL (ref 0.0–0.1)
Basophils Relative: 0 %
Eosinophils Absolute: 0 10*3/uL (ref 0.0–0.5)
Eosinophils Relative: 0 %
HCT: 44.2 % (ref 39.0–52.0)
Hemoglobin: 14.7 g/dL (ref 13.0–17.0)
Immature Granulocytes: 2 %
Lymphocytes Relative: 3 %
Lymphs Abs: 0.6 10*3/uL — ABNORMAL LOW (ref 0.7–4.0)
MCH: 28.2 pg (ref 26.0–34.0)
MCHC: 33.3 g/dL (ref 30.0–36.0)
MCV: 84.7 fL (ref 80.0–100.0)
Monocytes Absolute: 0.9 10*3/uL (ref 0.1–1.0)
Monocytes Relative: 5 %
Neutro Abs: 16.1 10*3/uL — ABNORMAL HIGH (ref 1.7–7.7)
Neutrophils Relative %: 90 %
Platelets: 216 10*3/uL (ref 150–400)
RBC: 5.22 MIL/uL (ref 4.22–5.81)
RDW: 16.4 % — ABNORMAL HIGH (ref 11.5–15.5)
WBC: 18 10*3/uL — ABNORMAL HIGH (ref 4.0–10.5)
nRBC: 0 % (ref 0.0–0.2)

## 2020-02-24 LAB — GLUCOSE, CAPILLARY
Glucose-Capillary: 253 mg/dL — ABNORMAL HIGH (ref 70–99)
Glucose-Capillary: 289 mg/dL — ABNORMAL HIGH (ref 70–99)
Glucose-Capillary: 281 mg/dL — ABNORMAL HIGH (ref 70–99)
Glucose-Capillary: 199 mg/dL — ABNORMAL HIGH (ref 70–99)
Glucose-Capillary: 330 mg/dL — ABNORMAL HIGH (ref 70–99)
Glucose-Capillary: 319 mg/dL — ABNORMAL HIGH (ref 70–99)

## 2020-02-24 LAB — TRIGLYCERIDES: Triglycerides: 230 mg/dL — ABNORMAL HIGH (ref <150)

## 2020-02-24 MED ORDER — MIDAZOLAM 50MG/50ML (1MG/ML) PREMIX INFUSION
0.5000 mg/h | INTRAVENOUS | Status: DC
  Administered 2020-02-24 – 2020-02-26 (×3): 2 mg/h via INTRAVENOUS
  Filled 2020-02-24 (×3): qty 50

## 2020-02-24 MED ORDER — INSULIN GLARGINE 100 UNIT/ML ~~LOC~~ SOLN
25.0000 [IU] | Freq: Every day | SUBCUTANEOUS | Status: DC
  Administered 2020-02-24 – 2020-02-25 (×2): 25 [IU] via SUBCUTANEOUS
  Filled 2020-02-24 (×3): qty 0.25

## 2020-02-24 MED ORDER — INSULIN ASPART 100 UNIT/ML ~~LOC~~ SOLN
5.0000 [IU] | SUBCUTANEOUS | Status: DC
  Administered 2020-02-24 – 2020-02-26 (×11): 5 [IU] via SUBCUTANEOUS
  Filled 2020-02-24 (×7): qty 1

## 2020-02-24 MED ORDER — SENNOSIDES-DOCUSATE SODIUM 8.6-50 MG PO TABS
1.0000 | ORAL_TABLET | Freq: Two times a day (BID) | ORAL | Status: DC
  Administered 2020-02-24 – 2020-02-26 (×5): 1
  Filled 2020-02-24 (×5): qty 1

## 2020-02-24 NOTE — Progress Notes (Signed)
PT Cancellation Note  Patient Details Name: FARAAZ WOLIN MRN: [id number] DOB: 1955-08-12   Cancelled Treatment:    Reason Eval/Treat Not Completed: Patient not medically ready.  Chart reviewed--pt still intubated (initiated 1/7), sedated, and on vasopressors.  K+ up-trending to 6.8 today.  D/t pt's current medical status, pt does not appear medically appropriate for physical therapy participation.  Will sign off at this time.  Please re-consult PT when pt is medically appropriate to participate in PT.  Leitha Bleak, PT 02/24/20, 9:15 AM

## 2020-02-24 NOTE — Progress Notes (Signed)
Inpatient Diabetes Program Recommendations  AACE/ADA: New Consensus Statement on Inpatient Glycemic Control   Target Ranges:  Prepandial:   less than 140 mg/dL      Peak postprandial:   less than 180 mg/dL (1-2 hours)      Critically ill patients:  140 - 180 mg/dL  Results for ADREAN, HEITZ (MRN [id number]) as of 02/24/2020 10:25  Ref. Range 02/23/2020 07:33 02/23/2020 11:39 02/23/2020 16:32 02/23/2020 19:34 02/23/2020 23:31 02/24/2020 03:16 02/24/2020 07:51  Glucose-Capillary Latest Ref Range: 70 - 99 mg/dL 132 (H) 156 (H) 144 (H) 188 (H) 198 (H) 253 (H) 289 (H)    Review of Glycemic Control  Current orders for Inpatient glycemic control: Lantus 20 units QHS, Novolog 0-20 units Q4H; Solucortef 50 mg Q6H, Vital @ 40 ml/hr   Inpatient Diabetes Program Recommendations:    Insulin-Basal: If steroids continued as ordered please consider increasing Lantus to 25 units QHS.  Insulin-Tube Feeding: Please consider ordering Novolog 5 units Q4H for tube feeding coverage. If tube feeding is stopped or held then Novolog tube feeding coverage should also be stopped or held.  Thanks, Barnie Alderman, RN, MSN, CDE Diabetes Coordinator Inpatient Diabetes Program [phone] (Team Pager from 8am to 5pm)

## 2020-02-24 NOTE — Progress Notes (Signed)
Treatment started without any issues or concerns with vasopressors started per ICU RN (Dillion). Noted improvement with blood pressures and heart rate immediately and treatment was able to be started. UF goal 567ml x 2.5hrs per Dr. Candiss Norse orders with a 1K/2.5Ca bath.Will continue to monitor.

## 2020-02-24 NOTE — Plan of Care (Signed)
Patient remains intubated, sedated, on vasopressors. Slow fib. Acute renal failure. High risk for cardiac arrest.   Problem: Education: Goal: Knowledge of General Education information will improve Description: Including pain rating scale, medication(s)/side effects and non-pharmacologic comfort measures Outcome: Not Progressing   Problem: Health Behavior/Discharge Planning: Goal: Ability to manage health-related needs will improve Outcome: Not Progressing   Problem: Clinical Measurements: Goal: Ability to maintain clinical measurements within normal limits will improve Outcome: Not Progressing Goal: Will remain free from infection Outcome: Not Progressing Goal: Diagnostic test results will improve Outcome: Not Progressing Goal: Respiratory complications will improve Outcome: Not Progressing Goal: Cardiovascular complication will be avoided Outcome: Not Progressing   Problem: Activity: Goal: Risk for activity intolerance will decrease Outcome: Not Progressing   Problem: Nutrition: Goal: Adequate nutrition will be maintained Outcome: Not Progressing   Problem: Coping: Goal: Level of anxiety will decrease Outcome: Not Progressing   Problem: Elimination: Goal: Will not experience complications related to bowel motility Outcome: Not Progressing Goal: Will not experience complications related to urinary retention Outcome: Not Progressing   Problem: Pain Managment: Goal: General experience of comfort will improve Outcome: Not Progressing   Problem: Safety: Goal: Ability to remain free from injury will improve Outcome: Not Progressing   Problem: Skin Integrity: Goal: Risk for impaired skin integrity will decrease Outcome: Not Progressing

## 2020-02-24 NOTE — Progress Notes (Signed)
CRITICAL VALUE ALERT  Critical Value:  K  Date & Time Notied:  02/24/20 @ 9935  Provider Notified: Tana Conch, NP  Orders Received/Actions taken: No new orders at this time.

## 2020-02-24 NOTE — Progress Notes (Signed)
GOALS OF CARE DISCUSSION  The Clinical status was relayed to family in detail. Wife Amy Lukacs  Updated and notified of patients medical condition.  Patient remains unresponsive and will not open eyes to command.   Upon assessment his breath sounds are course crackles with significant secretions to oral pharyngeal region.   Patient is having a weak cough and struggling to remove secretions.   patient with increased WOB and using accessory muscles to breathe Explained to family course of therapy and the modalities     Patient with Progressive multiorgan failure with very low chance of meaningful recovery despite all aggressive and optimal medical therapy. Patient is in the Dying  Process associated with Suffering.  Family understands the situation.  They have consented and agreed to DNR. End of Life Visitation policy  To be implemented  Family are satisfied with Plan of action and management. All questions answered  Additional CC time 32 mins   Niang Mitcheltree Patricia Pesa, M.D.  [facility]  Medical Director [place] Director [facility] Cardio-Pulmonary Department

## 2020-02-24 NOTE — Progress Notes (Signed)
Progress Note  Patient Name: Bobby Lindsey Date of Encounter: 02/24/2020  [facility] Cardiologist: Minus Breeding, MD  Subjective   Intubated and sedated.  Remain critically ill.  Inpatient Medications    Scheduled Meds: . vitamin C  500 mg Per Tube Daily  . aspirin  81 mg Per Tube Daily  . atorvastatin  80 mg Per Tube Daily  . chlorhexidine gluconate (MEDLINE KIT)  15 mL Mouth Rinse BID  . Chlorhexidine Gluconate Cloth  6 each Topical Daily  . docusate  100 mg Per Tube BID  . feeding supplement (PROSource TF)  90 mL Per Tube QID  . feeding supplement (VITAL 1.5 CAL)  1,000 mL Per Tube Q24H  . fentaNYL (SUBLIMAZE) injection  25 mcg Intravenous Once  . folic acid  1 mg Per Tube Daily  . hydrocortisone sod succinate (SOLU-CORTEF) inj  50 mg Intravenous Q6H  . insulin aspart  0-20 Units Subcutaneous Q4H  . insulin aspart  5 Units Subcutaneous Q4H  . insulin glargine  25 Units Subcutaneous QHS  . mouth rinse  15 mL Mouth Rinse 10 times per day  . metoCLOPramide (REGLAN) injection  5 mg Intravenous Q8H  . multivitamin with minerals  1 tablet Per Tube Daily  . pantoprazole (PROTONIX) IV  40 mg Intravenous Q12H  . polyethylene glycol  17 g Per Tube Daily  . senna-docusate  1 tablet Per Tube BID  . thiamine  100 mg Per Tube Daily  . zinc sulfate  220 mg Per Tube Daily   Continuous Infusions: . sodium chloride Stopped (02/20/2020 2116)  . sodium chloride Stopped (02/20/20 1126)  . fentaNYL infusion INTRAVENOUS 100 mcg/hr (02/24/20 1200)  . midazolam 2 mg/hr (02/24/20 1200)  . norepinephrine (LEVOPHED) Adult infusion 2 mcg/min (02/24/20 1304)  . phenylephrine (NEO-SYNEPHRINE) Adult infusion 60 mcg/min (02/24/20 1200)  . prismasol BGK 2/2.5 dialysis solution 1,500 mL/hr at 02/21/20 1919  . prismasol BGK 2/2.5 replacement solution 300 mL/hr at 02/21/20 1230  . prismasol BGK 2/2.5 replacement solution 300 mL/hr at 02/21/20 1230  . vasopressin 0.03 Units/min (02/24/20 1200)    PRN Meds: acetaminophen, chlorpheniramine-HYDROcodone, fentaNYL, fluticasone, gabapentin, guaiFENesin-dextromethorphan, heparin, midazolam, midazolam, morphine injection, sodium chloride   Vital Signs    Vitals:   02/24/20 0900 02/24/20 1000 02/24/20 1100 02/24/20 1200  BP: 110/64 106/62 (!) 106/59 (!) 110/47  Pulse: 69 76 (!) 58 (!) 26  Resp: (!) 24 (!) 24 (!) 24 (!) 24  Temp: 98.06 F (36.7 C) 98.06 F (36.7 C) 98.06 F (36.7 C) 98.24 F (36.8 C)  TempSrc:    Esophageal  SpO2: 97% 97% 94% 100%  Weight:      Height:        Intake/Output Summary (Last 24 hours) at 02/24/2020 1324 Last data filed at 02/24/2020 1200 Gross per 24 hour  Intake 1638.13 ml  Output 0 ml  Net 1638.13 ml   Last 3 Weights 02/23/2020 02/22/2020 02/21/2020  Weight (lbs) 236 lb 15.9 oz 243 lb 9.7 oz 219 lb 5.7 oz  Weight (kg) 107.5 kg 110.5 kg 99.5 kg      Telemetry    NSR with intermittent Mobitz type 1 second degree AV block. - Personally Reviewed  ECG    No new tracing.  Physical Exam   GEN: Intubated and sedated. Neck: Unable to assess JVP due to support devices and patient positioning. Cardiac: Irregular without murmurs, rubs, or gallops. Respiratory: Clear anteriorly. GI: Soft, nontender, non-distended  MS: Trace UE/LE edema; No deformity.  Neuro:  Intubated and sedated. Psych: Intubated and sedated.  Labs    High Sensitivity Troponin:   Recent Labs  Lab 02/11/20 1810 02/11/20 2055 03/05/2020 1713 02/24/2020 2028  TROPONINIHS 20* 20* 4,704* 5,901*      Chemistry Recent Labs  Lab 02/22/20 0628 02/22/20 1540 02/23/20 0320 02/24/20 0402  NA 132* 135 136 134*  K 6.2* 4.4 5.5* 6.8*  CL 95* 95* 97* 95*  CO2 18* 26 22 20*  GLUCOSE 162* 133* 166* 282*  BUN 126* 76* 100* 131*  CREATININE 7.43* 5.19* 6.95* 9.18*  CALCIUM 8.0* 7.8* 7.8* 7.3*  PROT 6.5  --  6.8 6.8  ALBUMIN 2.4* 2.6* 2.4* 2.3*  AST 57*  --  67* 78*  ALT 30  --  29 31  ALKPHOS 80  --  86 89  BILITOT 1.3*  --   1.1 1.0  GFRNONAA 8* 12* 8* 6*  ANIONGAP 19* 14 17* 19*     Hematology Recent Labs  Lab 02/22/20 0628 02/23/20 0320 02/24/20 0402  WBC 15.6* 19.2* 18.0*  RBC 5.33 5.36 5.22  HGB 14.7 15.2 14.7  HCT 45.7 44.6 44.2  MCV 85.7 83.2 84.7  MCH 27.6 28.4 28.2  MCHC 32.2 34.1 33.3  RDW 15.8* 15.9* 16.4*  PLT 151 199 216    BNPNo results for input(s): BNP, PROBNP in the last 168 hours.   DDimer  Recent Labs  Lab 02/21/20 0344 02/22/20 0628 02/23/20 0320  DDIMER 17.37* 6.27* 2.71*     Radiology    DG Chest Port 1 View  Result Date: 02/24/2020 CLINICAL DATA:  Intubation EXAM: PORTABLE CHEST 1 VIEW COMPARISON:  Radiograph 02/23/2020 FINDINGS: *Endotracheal tube in the mid trachea, 5.8 cm from the carina. *Transesophageal tube tip below the margins of imaging with side port beyond the GE junction. *Dual lumen right IJ catheter tip terminates at the right atrium. *Prior sternotomy, CABG *Telemetry leads and external support devices overlie the chest. Diminished lung volumes from prior which may contribute to what appear to be increased opacities in the mid to lower lungs bilaterally. Chronic left fibrothorax. No discernible pleural effusions or pneumothorax. Enlarged cardiac silhouette, may be accentuated by low volumes and the portable technique IMPRESSION: Lines and tubes as above. Significantly diminished lung volumes which may contribute to increasing opacities in both lungs. Enlarged appearance of cardiac silhouette, likely accentuated by diminished volumes and portable technique. Electronically Signed   By: Lovena Le M.D.   On: 02/24/2020 03:53   DG Chest Port 1 View  Result Date: 02/23/2020 CLINICAL DATA:  Acute respiratory failure EXAM: PORTABLE CHEST 1 VIEW COMPARISON:  Yesterday FINDINGS: Endotracheal tube with tip just below the clavicular heads. Enteric tube reaches the stomach. Dialysis catheter with tip at the lower SVC. Improved lung volumes, partially related to  differences in positioning. Continued interstitial and reticular opacities which could be from patient's COVID or failure. Left fibrothorax. No pneumothorax. IMPRESSION: 1. Unremarkable hardware positioning. 2. Improved aeration since yesterday. Electronically Signed   By: Monte Fantasia M.D.   On: 02/23/2020 04:06   DG Abd Portable 1V  Result Date: 02/23/2020 CLINICAL DATA:  Constipation EXAM: PORTABLE ABDOMEN - 1 VIEW COMPARISON:  February 21, 2020 FINDINGS: Nasogastric tube tip and side port in stomach. There is moderate stool in the colon. There is no bowel dilatation or air-fluid level to suggest bowel obstruction. No free air. Foci of vascular calcification noted in the pelvis. IMPRESSION: Moderate stool in colon. No bowel obstruction or free air. Nasogastric  tube tip and side port in stomach. Electronically Signed   By: Lowella Grip III M.D.   On: 02/23/2020 14:39    Cardiac Studies   LHC (03/01/2020): STEMI presentation Negative vessels all 100% occluded proximally Patent LIMA to LAD Occluded SVGs to RCA and OM IRA SVG to diagonal 1 TIMI 0 flow thrombotic Unsuccessful PCI of SVG to diagonal Recommend conservative medical therapy  TTE (02/13/2020): 1. Images are very dfficult to evaluate even with Definity contrast.  There appears to be lateral wall hypokinesis, mostly matching the typical  distribution of the left circunflex coronary artery. Left ventricular  ejection fraction, by estimation, is 45  to 50%. The left ventricle has mildly decreased function. The left  ventricle has no regional wall motion abnormalities. The left ventricular  internal cavity size was mildly dilated. There is mild eccentric left  ventricular hypertrophy. Left ventricular  diastolic parameters are consistent with Grade II diastolic dysfunction  (pseudonormalization). Elevated left atrial pressure. There is the  interventricular septum is flattened in systole, consistent with right  ventricular  pressure overload.  2. Right ventricular systolic function is moderately reduced. The right  ventricular size is mildly enlarged. There is severely elevated pulmonary  artery systolic pressure.  3. Left atrial size was severely dilated.  4. The mitral valve is normal in structure. No evidence of mitral valve  regurgitation. No evidence of mitral stenosis.  5. Tricuspid valve regurgitation is mild to moderate.  6. The aortic valve is tricuspid. Aortic valve regurgitation is not  visualized. Mild aortic valve sclerosis is present, with no evidence of  aortic valve stenosis.  7. The inferior vena cava is normal in size with greater than 50%  respiratory variability, suggesting right atrial pressure of 3 mmHg.   Patient Profile     65 y.o. male with history of CAD s/p CABG (2002) and multiple PCI's, PAD, hypertension, hyperlipidemia, diabetes mellitus, CKD, and obesity, admitted with [id number] complicated by STEMI with unsuccessful PCI to SVG to diagonal versus OM.  Assessment & Plan    STEMI: Unsuccessful attempt to open up subtotally occluded SVG thought to be culprit lesion for the patient's STEMI.  Multiple angioplasties were performed without restoration of flow.  Stent was not placed.  Continue aspirin and statin.  Defer addition of P2Y12 inhibitor pending clinical improvement given COVID-19 with shock and multiorgan dysfunction.  Patient completed approximately 12 hours of IV heparin, held in the setting of worsening coagulopathy.  Shock: Suspect this is multifactorial including septic shock in the setting of COVID-19 as well as a cardiogenic component.  Echo performed just a few weeks ago before this coronary event showed LVEF of 45-50%.  Repeat echo ordered but not done.  Perform limited echo to exclude post MI complication contributing to persistent shock and multiorgan dysfunction.  Vasopressor support per CCM.  Continue COVID treatment per CCM.  Acute kidney injury  superimposed on chronic kidney disease: Worsening creatinine and hyperkalemia this morning.  Unable to tolerate CRRT due to high dialyzer pressure with poor blood flow return.  Intermittent hemodialysis performed yesterday for hyperkalemia and acidosis.  Plans to repeat IHD today for hyperkalemia.  Continue IHD per nephrology.  Avoid nephrotoxic drugs.  Pittore failure with hypoxia: Likely due to COVID-19 and potentially some component of pulmonary edema  Ventilator management and COVID-19 treatment per CCM.  Gentle fluid removal, as blood pressure tolerates, via IHD.  For questions or updates, please contact [place] Please consult [email or web address] for contact info under [facility].  Signed, Nelva Bush, MD  02/24/2020, 1:24 PM

## 2020-02-24 NOTE — Progress Notes (Signed)
CRITICAL CARE NOTE  65 yo male recently diagnosed with COVID-19 pneumonia on 12/30 which he required hospitalization at [facility] 12/30 to 01/3 now admitted with STEMI and acute hypoxic respiratory failure secondary to COVID-19 pneumonia requiring Bipap s/p cardiac catheterization with unsuccessful PCI and stent placement to SVG per cardiology recommending uninterrupted dual antiplatelet therapy  Cardiac cath revealed multiple occlusions, however PCI and stent placement to the SVG unsuccessful per cardiology recommending uninterrupted dual antiplatelet therapy for a minimum of 6 months.  Pt admitted to ICU post procedure due to Bipap requirement secondary to acute hypoxic respiratory failure in the setting of COVID-19 pneumonia.    Significant Hospital Events:  01/5: Pt admitted to ICU post cardiac cath 1/6 TRANSFERRED TO [facility]  1/7 Progressive resp failure and renal failure, Family made aware and reversed CODE status 1/7 patient was emergently intubated and placed on MV suport 1/7 vasc cath placed in anticipation for CRRT 1/8 severe resp failure, on CRRT, on pressors 1/10 remains on pressors, almost dies last night 09-Mar-2022 active dying process         CC  follow up respiratory failure  SUBJECTIVE Patient remains critically ill Prognosis is guarded Dying process, multiorgan failure On pressors  Vent Mode: PRVC FiO2 (%):  [60 %-100 %] 100 % Set Rate:  [24 bmp] 24 bmp Vt Set:  [550 mL] 550 mL PEEP:  [10 cmH20] 10 cmH20 Plateau Pressure:  [24 cmH20-28 cmH20] 25 cmH20  CBC    Component Value Date/Time   WBC 18.0 (H) 03/09/2020 0402   RBC 5.22 03/09/2020 0402   HGB 14.7 09-Mar-2020 0402   HGB 14.9 12/01/2019 1618   HCT 44.2 03-09-2020 0402   HCT 44.6 12/01/2019 1618   PLT 216 03-09-2020 0402   PLT 174 12/01/2019 1618   MCV 84.7 2020/03/09 0402   MCV 87 12/01/2019 1618   MCH 28.2 March 09, 2020 0402   MCHC 33.3 2020-03-09 0402   RDW 16.4 (H) 03/09/2020 0402    RDW 13.9 12/01/2019 1618   LYMPHSABS 0.6 (L) Mar 09, 2020 0402   LYMPHSABS 2.1 03/31/2015 1100   MONOABS 0.9 03-09-20 0402   EOSABS 0.0 03-09-2020 0402   EOSABS 0.2 03/31/2015 1100   BASOSABS 0.0 09-Mar-2020 0402   BASOSABS 0.0 03/31/2015 1100   BMP Latest Ref Rng & Units 2020-03-09 02/23/2020 02/22/2020  Glucose 70 - 99 mg/dL 282(H) 166(H) 133(H)  BUN 8 - 23 mg/dL 131(H) 100(H) 76(H)  Creatinine 0.61 - 1.24 mg/dL 9.18(H) 6.95(H) 5.19(H)  BUN/Creat Ratio 10 - 24 - - -  Sodium 135 - 145 mmol/L 134(L) 136 135  Potassium 3.5 - 5.1 mmol/L 6.8(HH) 5.5(H) 4.4  Chloride 98 - 111 mmol/L 95(L) 97(L) 95(L)  CO2 22 - 32 mmol/L 20(L) 22 26  Calcium 8.9 - 10.3 mg/dL 7.3(L) 7.8(L) 7.8(L)     BP 111/70   Pulse 68   Temp 98.42 F (36.9 C)   Resp (!) 24   Ht 5' 10.98" (1.803 m)   Wt 107.5 kg   SpO2 100%   BMI 33.07 kg/m    I/O last 3 completed shifts: In: 2175.8 [I.V.:1903.8; NG/GT:272] Out: 65 [Urine:190; Emesis/NG output:300] No intake/output data recorded.  SpO2: 100 % O2 Flow Rate (L/min): 15 L/min FiO2 (%): 100 %  Estimated body mass index is 33.07 kg/m as calculated from the following:   Height as of this encounter: 5' 10.98" (1.803 m).   Weight as of this encounter: 107.5 kg.  SIGNIFICANT EVENTS   REVIEW OF SYSTEMS  PATIENT  IS UNABLE TO PROVIDE COMPLETE REVIEW OF SYSTEMS DUE TO SEVERE CRITICAL ILLNESS      COVID-19 DISASTER DECLARATION:   FULL CONTACT PHYSICAL EXAMINATION WAS NOT POSSIBLE DUE TO TREATMENT OF COVID-19  AND CONSERVATION OF PERSONAL PROTECTIVE EQUIPMENT, LIMITED EXAM FINDINGS INCLUDE-   PHYSICAL EXAMINATION:  GENERAL:critically ill appearing, +resp distress NEUROLOGIC: obtunded, GCS<8   Patient assessed or the symptoms described in the history of present illness.  In the context of the Global COVID-19 pandemic, which necessitated consideration that the patient might be at risk for infection with the SARS-CoV-2 virus that causes COVID-19,  Institutional protocols and algorithms that pertain to the evaluation of patients at risk for COVID-19 are in a state of rapid change based on information released by regulatory bodies including the CDC and federal and state organizations. These policies and algorithms were followed during the patient's care while in hospital.    MEDICATIONS: I have reviewed all medications and confirmed regimen as documented   CULTURE RESULTS   Recent Results (from the past 240 hour(s))  Culture, blood (Routine X 2) w Reflex to ID Panel     Status: None   Collection Time: 03/07/2020  8:28 PM   Specimen: BLOOD  Result Value Ref Range Status   Specimen Description BLOOD RIGHT ANTECUBITAL  Final   Special Requests   Final    BOTTLES DRAWN AEROBIC AND ANAEROBIC Blood Culture results may not be optimal due to an excessive volume of blood received in culture bottles   Culture   Final    NO GROWTH 5 DAYS Performed at [facility], [address] [place], [place] [address]    Report Status 02/23/2020 FINAL  Final  Culture, blood (Routine X 2) w Reflex to ID Panel     Status: None   Collection Time: 03/06/2020  8:30 PM   Specimen: BLOOD  Result Value Ref Range Status   Specimen Description BLOOD BLOOD RIGHT HAND  Final   Special Requests   Final    BOTTLES DRAWN AEROBIC AND ANAEROBIC Blood Culture adequate volume   Culture   Final    NO GROWTH 5 DAYS Performed at [facility], [address] [place], [place] [address]    Report Status 02/23/2020 FINAL  Final  MRSA PCR Screening     Status: None   Collection Time: 03/10/2020 11:42 PM   Specimen: Nasopharyngeal  Result Value Ref Range Status   MRSA by PCR NEGATIVE NEGATIVE Final    Comment:        The GeneXpert MRSA Assay (FDA approved for NASAL specimens only), is one component of a comprehensive MRSA colonization surveillance program. It is not intended to diagnose MRSA infection nor to guide or monitor treatment for MRSA  infections. Performed at [facility], [address] [place], [place] [address]           IMAGING    DG Chest Port 1 View  Result Date: 02/24/2020 CLINICAL DATA:  Intubation EXAM: PORTABLE CHEST 1 VIEW COMPARISON:  Radiograph 02/23/2020 FINDINGS: *Endotracheal tube in the mid trachea, 5.8 cm from the carina. *Transesophageal tube tip below the margins of imaging with side port beyond the GE junction. *Dual lumen right IJ catheter tip terminates at the right atrium. *Prior sternotomy, CABG *Telemetry leads and external support devices overlie the chest. Diminished lung volumes from prior which may contribute to what appear to be increased opacities in the mid to lower lungs bilaterally. Chronic left fibrothorax. No discernible pleural effusions or  pneumothorax. Enlarged cardiac silhouette, may be accentuated by low volumes and the portable technique IMPRESSION: Lines and tubes as above. Significantly diminished lung volumes which may contribute to increasing opacities in both lungs. Enlarged appearance of cardiac silhouette, likely accentuated by diminished volumes and portable technique. Electronically Signed   By: Lovena Le M.D.   On: 02/24/2020 03:53   DG Abd Portable 1V  Result Date: 02/23/2020 CLINICAL DATA:  Constipation EXAM: PORTABLE ABDOMEN - 1 VIEW COMPARISON:  February 21, 2020 FINDINGS: Nasogastric tube tip and side port in stomach. There is moderate stool in the colon. There is no bowel dilatation or air-fluid level to suggest bowel obstruction. No free air. Foci of vascular calcification noted in the pelvis. IMPRESSION: Moderate stool in colon. No bowel obstruction or free air. Nasogastric tube tip and side port in stomach. Electronically Signed   By: Lowella Grip III M.D.   On: 02/23/2020 14:39     Nutrition Status: Nutrition Problem: Inadequate oral intake Etiology: inability to eat Signs/Symptoms: NPO status Interventions: Tube  feeding,Prostat,MVI     Indwelling Urinary Catheter continued, requirement due to   Reason to continue Indwelling Urinary Catheter strict Intake/Output monitoring for hemodynamic instability   Central Line/ continued, requirement due to  Reason to continue [place] of central venous pressure or other hemodynamic parameters and poor IV access   Ventilator continued, requirement due to severe respiratory failure   Ventilator Sedation RASS 0 to -2      ASSESSMENT AND PLAN SYNOPSIS    65 yo with Acute hypoxic respiratory failure secondary to COVID-19 pneumoniaWith Multiorgan failure, very poor prognosis, Severe end stage ischemic cardiomyopathy and sCHF complicated by acute rena failure Multiorgan failure, very poor prognosis    Severe ACUTE Hypoxic and Hypercapnic Respiratory Failure -continue Full MV support -continue Bronchodilator Therapy -Wean Fio2 and PEEP as tolerated -VAP/VENT bundle implementation  ACUTE SYSTOLIC CARDIAC FAILURE- STEMI -oxygen as needed -Lasix as tolerated -follow up cardiac enzymes as indicated -follow up cardiology recs   Morbid obesity, possible OSA.   Will certainly impact respiratory mechanics, ventilator weaning Suspect will need to consider additional PEEP   ACUTE KIDNEY INJURY/Renal Failure -continue Foley Catheter-assess need -Avoid nephrotoxic agents -Follow urine output, BMP -Ensure adequate renal perfusion, optimize oxygenation -Renal dose medications HD as tolerated     NEUROLOGY Acute toxic metabolic encephalopathy, need for sedation Goal RASS -2 to -3  SHOCK-CARDIOGENIC -use vasopressors to keep MAP>65  CARDIAC ICU monitoring  GI GI PROPHYLAXIS as indicated   DIET-->TF's as tolerated Constipation protocol as indicated  ENDO - will use ICU hypoglycemic\Hyperglycemia protocol if indicated     ELECTROLYTES -follow labs as needed -replace as needed -pharmacy consultation and  following   DVT/GI PRX ordered and assessed TRANSFUSIONS AS NEEDED MONITOR FSBS I Assessed the need for Labs I Assessed the need for Foley I Assessed the need for Central Venous Line Family Discussion when available I Assessed the need for Mobilization I made an Assessment of medications to be adjusted accordingly Safety Risk assessment completed   CASE DISCUSSED IN MULTIDISCIPLINARY ROUNDS WITH ICU TEAM  Critical Care Time devoted to patient care services described in this note is 45  minutes.   Overall, patient is critically ill, prognosis is guarded.  Patient with Multiorgan failure and at high risk for cardiac arrest and death.   Patient is DNR  Corrin Parker, M.D.  [facility]  Medical Director [place] Director [facility] Cardio-Pulmonary Department

## 2020-02-24 NOTE — Progress Notes (Signed)
[place], [place] 02/24/20  Subjective:   Hospital day # 6 Remains critically ill  ACZ:YSAYTKZS- phenylephrine Pulm: vent assisted Fio 70% Gi: OG in place, clamped Renal: poor UOP- CRRT could not be run due to high dialyzer pressures. INR already high so not option of heparin IHD on 1/10- slow BFR K high again on 1/11 Neuro: sedated with propofol, fentanyl 01/10 0701 - 01/11 0700 In: 1146.8 [I.V.:874.8; NG/GT:272] Out: 65 [Urine:65] Lab Results  Component Value Date   CREATININE 9.18 (H) 02/24/2020   CREATININE 6.95 (H) 02/23/2020   CREATININE 5.19 (H) 02/22/2020     Objective:  Vital signs in last 24 hours:  Temp:  [98.24 F (36.8 C)-99.5 F (37.5 C)] 98.42 F (36.9 C) (01/11 0800) Pulse Rate:  [48-76] 62 (01/11 0800) Resp:  [13-25] 24 (01/11 0800) BP: (97-140)/(49-82) 116/71 (01/11 0800) SpO2:  [86 %-100 %] 100 % (01/11 0800) FiO2 (%):  [60 %-100 %] 90 % (01/11 0800)  Weight change:  Filed Weights   02/21/20 0457 02/22/20 0425 02/23/20 0419  Weight: 99.5 kg 110.5 kg 107.5 kg    Intake/Output:    Intake/Output Summary (Last 24 hours) at 02/24/2020 0857 Last data filed at 02/24/2020 0800 Gross per 24 hour  Intake 1262.58 ml  Output 65 ml  Net 1197.58 ml    Physical Exam: General:  critically ill, laying in the bed  HEENT  anicteric, ETT, OGT  Pulm/lungs  Vent assisted, coarse  CVS/Heart  irregular rhythm, bigemminy  Abdomen:   Soft, nondistended  Extremities:  + dependent and  peripheral edema, left BKA  Neurologic:  sedated  Skin:  No acute rashes  Right IJ temp cath    Basic Metabolic Panel:  Recent Labs  Lab 02/19/20 0228 02/20/20 0539 02/21/20 0230 02/21/20 1547 02/22/20 0628 02/22/20 1540 02/23/20 0320 02/24/20 0402  NA 132* 127* 130* 131* 132* 135 136 134*  K 4.7 5.3* 5.6* 6.1* 6.2* 4.4 5.5* 6.8*  CL 95* 92* 92* 93* 95* 95* 97* 95*  CO2 25 20* 22 19* 18* 26 22 20*  GLUCOSE 178* 343* 148* 123* 162*  133* 166* 282*  BUN 96* 121* 140* 138* 126* 76* 100* 131*  CREATININE 2.37* 4.86* 6.89* 6.96* 7.43* 5.19* 6.95* 9.18*  CALCIUM 8.1* 8.0* 8.3* 8.2* 8.0* 7.8* 7.8* 7.3*  MG 2.3 2.7* 3.0*  --   --   --  3.0* 3.2*  PHOS 4.2 6.6* 9.2* 9.1* 10.5* 6.5* 8.6*  --      CBC: Recent Labs  Lab 02/20/20 0539 02/21/20 0230 02/22/20 0628 02/23/20 0320 02/24/20 0402  WBC 8.5 16.5* 15.6* 19.2* 18.0*  NEUTROABS 7.1 14.8* 14.0* 17.2* 16.1*  HGB 15.2 16.3 14.7 15.2 14.7  HCT 44.8 50.1 45.7 44.6 44.2  MCV 83.4 84.9 85.7 83.2 84.7  PLT 118* 197 151 199 216      Lab Results  Component Value Date   HEPBSAG NON REACTIVE 02/22/2020   HEPBSAB NON REACTIVE 02/21/2020   HEPBIGM NON REACTIVE 02/21/2020      Microbiology:  Recent Results (from the past 240 hour(s))  Culture, blood (Routine X 2) w Reflex to ID Panel     Status: None   Collection Time: 03/15/2020  8:28 PM   Specimen: BLOOD  Result Value Ref Range Status   Specimen Description BLOOD RIGHT ANTECUBITAL  Final   Special Requests   Final    BOTTLES DRAWN AEROBIC AND ANAEROBIC Blood Culture results may not be optimal due to an excessive  volume of blood received in culture bottles   Culture   Final    NO GROWTH 5 DAYS Performed at [facility], [address] [place], [place] [address]    Report Status 02/23/2020 FINAL  Final  Culture, blood (Routine X 2) w Reflex to ID Panel     Status: None   Collection Time: 02/24/2020  8:30 PM   Specimen: BLOOD  Result Value Ref Range Status   Specimen Description BLOOD BLOOD RIGHT HAND  Final   Special Requests   Final    BOTTLES DRAWN AEROBIC AND ANAEROBIC Blood Culture adequate volume   Culture   Final    NO GROWTH 5 DAYS Performed at [facility], [address] [place], [place] [address]    Report Status 02/23/2020 FINAL  Final  MRSA PCR Screening     Status: None   Collection Time: 03/04/2020 11:42 PM   Specimen: Nasopharyngeal  Result Value Ref Range Status   MRSA  by PCR NEGATIVE NEGATIVE Final    Comment:        The GeneXpert MRSA Assay (FDA approved for NASAL specimens only), is one component of a comprehensive MRSA colonization surveillance program. It is not intended to diagnose MRSA infection nor to guide or monitor treatment for MRSA infections. Performed at [facility], [address] [place], [place] [address]     Coagulation Studies: Recent Labs    02/21/20 1547 02/21/20 2139 02/22/20 0628 02/23/20 0635 02/24/20 0402  LABPROT 45.3* 51.3* 51.2* 44.9* 37.3*  INR 5.0* 5.9* 5.9* 5.0* 3.9*    Urinalysis: No results for input(s): COLORURINE, LABSPEC, PHURINE, GLUCOSEU, HGBUR, BILIRUBINUR, KETONESUR, PROTEINUR, UROBILINOGEN, NITRITE, LEUKOCYTESUR in the last 72 hours.  Invalid input(s): APPERANCEUR    Imaging: DG Chest Port 1 View  Result Date: 02/24/2020 CLINICAL DATA:  Intubation EXAM: PORTABLE CHEST 1 VIEW COMPARISON:  Radiograph 02/23/2020 FINDINGS: *Endotracheal tube in the mid trachea, 5.8 cm from the carina. *Transesophageal tube tip below the margins of imaging with side port beyond the GE junction. *Dual lumen right IJ catheter tip terminates at the right atrium. *Prior sternotomy, CABG *Telemetry leads and external support devices overlie the chest. Diminished lung volumes from prior which may contribute to what appear to be increased opacities in the mid to lower lungs bilaterally. Chronic left fibrothorax. No discernible pleural effusions or pneumothorax. Enlarged cardiac silhouette, may be accentuated by low volumes and the portable technique IMPRESSION: Lines and tubes as above. Significantly diminished lung volumes which may contribute to increasing opacities in both lungs. Enlarged appearance of cardiac silhouette, likely accentuated by diminished volumes and portable technique. Electronically Signed   By: Lovena Le M.D.   On: 02/24/2020 03:53   DG Chest Port 1 View  Result Date: 02/23/2020 CLINICAL  DATA:  Acute respiratory failure EXAM: PORTABLE CHEST 1 VIEW COMPARISON:  Yesterday FINDINGS: Endotracheal tube with tip just below the clavicular heads. Enteric tube reaches the stomach. Dialysis catheter with tip at the lower SVC. Improved lung volumes, partially related to differences in positioning. Continued interstitial and reticular opacities which could be from patient's COVID or failure. Left fibrothorax. No pneumothorax. IMPRESSION: 1. Unremarkable hardware positioning. 2. Improved aeration since yesterday. Electronically Signed   By: Monte Fantasia M.D.   On: 02/23/2020 04:06   DG Abd Portable 1V  Result Date: 02/23/2020 CLINICAL DATA:  Constipation EXAM: PORTABLE ABDOMEN - 1 VIEW COMPARISON:  February 21, 2020 FINDINGS: Nasogastric tube tip and side port in stomach. There is moderate  stool in the colon. There is no bowel dilatation or air-fluid level to suggest bowel obstruction. No free air. Foci of vascular calcification noted in the pelvis. IMPRESSION: Moderate stool in colon. No bowel obstruction or free air. Nasogastric tube tip and side port in stomach. Electronically Signed   By: Lowella Grip III M.D.   On: 02/23/2020 14:39     Medications:   . sodium chloride Stopped (03/03/2020 2116)  . sodium chloride Stopped (02/20/20 1126)  . fentaNYL infusion INTRAVENOUS 200 mcg/hr (02/24/20 0853)  . norepinephrine (LEVOPHED) Adult infusion Stopped (02/22/20 2335)  . phenylephrine (NEO-SYNEPHRINE) Adult infusion 120 mcg/min (02/24/20 0800)  . prismasol BGK 2/2.5 dialysis solution 1,500 mL/hr at 02/21/20 1919  . prismasol BGK 2/2.5 replacement solution 300 mL/hr at 02/21/20 1230  . prismasol BGK 2/2.5 replacement solution 300 mL/hr at 02/21/20 1230  . propofol (DIPRIVAN) infusion 15 mcg/kg/min (02/24/20 0800)  . vasopressin     . vitamin C  500 mg Per Tube Daily  . aspirin  81 mg Per Tube Daily  . atorvastatin  80 mg Per Tube Daily  . chlorhexidine gluconate (MEDLINE KIT)  15 mL  Mouth Rinse BID  . Chlorhexidine Gluconate Cloth  6 each Topical Daily  . docusate  100 mg Per Tube BID  . feeding supplement (PROSource TF)  90 mL Per Tube QID  . feeding supplement (VITAL 1.5 CAL)  1,000 mL Per Tube Q24H  . fentaNYL (SUBLIMAZE) injection  25 mcg Intravenous Once  . folic acid  1 mg Per Tube Daily  . hydrocortisone sod succinate (SOLU-CORTEF) inj  50 mg Intravenous Q6H  . insulin aspart  0-20 Units Subcutaneous Q4H  . insulin glargine  20 Units Subcutaneous QHS  . mouth rinse  15 mL Mouth Rinse 10 times per day  . metoCLOPramide (REGLAN) injection  5 mg Intravenous Q8H  . multivitamin with minerals  1 tablet Per Tube Daily  . pantoprazole (PROTONIX) IV  40 mg Intravenous Q12H  . polyethylene glycol  17 g Per Tube Daily  . thiamine  100 mg Per Tube Daily  . zinc sulfate  220 mg Per Tube Daily   acetaminophen, chlorpheniramine-HYDROcodone, fentaNYL, fluticasone, gabapentin, guaiFENesin-dextromethorphan, heparin, midazolam, midazolam, morphine injection, sodium chloride  Assessment/ Plan:  65 y.o. male with diabetes type 2, hypertension, hyperlipidemia, coronary artery disease, obstructive sleep apnea, atrial fibrillation, history of right leg DVT 2015 January,   admitted on 02/17/2020 for COVID-19 [U07.1] STEMI (ST elevation myocardial infarction) ([place]) [I21.3]  #. ARF with hyperkalemia and acidosis Urinalysis from February 12, 2020 small hemoglobin, negative protein, 0-5 RBCs, 0-5 WBCs No renal imaging available at present CRRT started 02/21/2020-d/c 1/9 due to high dialyzer pressure and poor BFR  IHD on 1/10 for hyperkalemia and acidosis  1/11- IHD for severe hyperkalemia  # severe hyperkalemia Lab Results  Component Value Date   K 6.8 ([place]) 02/24/2020     #. Acute hypoxic resp failure -intubated on 1/7 -Requiring ventilator support     #. Covid -19 pneumonia IV remdesivir start 02/15/2020 Solu-Medrol started 02/14/2020      LOS: [place] 1/11/20228:57 AM  [place], [place]  Note: This note was prepared with Dragon dictation. Any transcription errors are unintentional

## 2020-02-25 ENCOUNTER — Inpatient Hospital Stay (HOSPITAL_COMMUNITY)
Admit: 2020-02-25 | Discharge: 2020-02-25 | Disposition: A | Discharge status: Home / Self Care | Payer: Medicare HMO | Attending: Internal Medicine | Admitting: Internal Medicine

## 2020-02-25 DIAGNOSIS — I5022 Chronic systolic (congestive) heart failure: Secondary | ICD-10-CM

## 2020-02-25 DIAGNOSIS — I213 ST elevation (STEMI) myocardial infarction of unspecified site: Secondary | ICD-10-CM | POA: Diagnosis not present

## 2020-02-25 LAB — BLOOD GAS, VENOUS
Acid-base deficit: 1.3 mmol/L (ref 0.0–2.0)
Bicarbonate: 24.3 mmol/L (ref 20.0–28.0)
FIO2: 0.6
MECHVT: 550 mL
O2 Saturation: 89.5 %
PEEP: 10 cmH2O
Patient temperature: 37
RATE: 24 resp/min
pCO2, Ven: 43 mmHg — ABNORMAL LOW (ref 44.0–60.0)
pH, Ven: 7.36 (ref 7.250–7.430)
pO2, Ven: 60 mmHg — ABNORMAL HIGH (ref 32.0–45.0)

## 2020-02-25 LAB — RENAL FUNCTION PANEL
Albumin: 2.1 g/dL — ABNORMAL LOW (ref 3.5–5.0)
Anion gap: 17 — ABNORMAL HIGH (ref 5–15)
BUN: 113 mg/dL — ABNORMAL HIGH (ref 8–23)
CO2: 24 mmol/L (ref 22–32)
Calcium: 7.6 mg/dL — ABNORMAL LOW (ref 8.9–10.3)
Chloride: 95 mmol/L — ABNORMAL LOW (ref 98–111)
Creatinine, Ser: 7.38 mg/dL — ABNORMAL HIGH (ref 0.61–1.24)
GFR, Estimated: 8 mL/min — ABNORMAL LOW (ref >60)
Glucose, Bld: 283 mg/dL — ABNORMAL HIGH (ref 70–99)
Phosphorus: 7.8 mg/dL — ABNORMAL HIGH (ref 2.5–4.6)
Potassium: 5.5 mmol/L — ABNORMAL HIGH (ref 3.5–5.1)
Sodium: 136 mmol/L (ref 135–145)

## 2020-02-25 LAB — CBC WITH DIFFERENTIAL/PLATELET
Abs Immature Granulocytes: 0.2 10*3/uL — ABNORMAL HIGH (ref 0.00–0.07)
Basophils Absolute: 0 10*3/uL (ref 0.0–0.1)
Basophils Relative: 0 %
Eosinophils Absolute: 0 10*3/uL (ref 0.0–0.5)
Eosinophils Relative: 0 %
HCT: 40.4 % (ref 39.0–52.0)
Hemoglobin: 13.3 g/dL (ref 13.0–17.0)
Immature Granulocytes: 2 %
Lymphocytes Relative: 3 %
Lymphs Abs: 0.4 10*3/uL — ABNORMAL LOW (ref 0.7–4.0)
MCH: 27.8 pg (ref 26.0–34.0)
MCHC: 32.9 g/dL (ref 30.0–36.0)
MCV: 84.3 fL (ref 80.0–100.0)
Monocytes Absolute: 0.7 10*3/uL (ref 0.1–1.0)
Monocytes Relative: 6 %
Neutro Abs: 11 10*3/uL — ABNORMAL HIGH (ref 1.7–7.7)
Neutrophils Relative %: 89 %
Platelets: 165 10*3/uL (ref 150–400)
RBC: 4.79 MIL/uL (ref 4.22–5.81)
RDW: 15.9 % — ABNORMAL HIGH (ref 11.5–15.5)
WBC: 12.3 10*3/uL — ABNORMAL HIGH (ref 4.0–10.5)
nRBC: 0 % (ref 0.0–0.2)

## 2020-02-25 LAB — PROTIME-INR
INR: 4.2 (ref 0.8–1.2)
Prothrombin Time: 39.3 seconds — ABNORMAL HIGH (ref 11.4–15.2)

## 2020-02-25 LAB — COMPREHENSIVE METABOLIC PANEL
ALT: 29 U/L (ref 0–44)
AST: 50 U/L — ABNORMAL HIGH (ref 15–41)
Albumin: 2 g/dL — ABNORMAL LOW (ref 3.5–5.0)
Alkaline Phosphatase: 80 U/L (ref 38–126)
Anion gap: 14 (ref 5–15)
BUN: 126 mg/dL — ABNORMAL HIGH (ref 8–23)
CO2: 24 mmol/L (ref 22–32)
Calcium: 7.1 mg/dL — ABNORMAL LOW (ref 8.9–10.3)
Chloride: 95 mmol/L — ABNORMAL LOW (ref 98–111)
Creatinine, Ser: 8.23 mg/dL — ABNORMAL HIGH (ref 0.61–1.24)
GFR, Estimated: 7 mL/min — ABNORMAL LOW (ref >60)
Glucose, Bld: 337 mg/dL — ABNORMAL HIGH (ref 70–99)
Potassium: 6 mmol/L — ABNORMAL HIGH (ref 3.5–5.1)
Sodium: 133 mmol/L — ABNORMAL LOW (ref 135–145)
Total Bilirubin: 0.8 mg/dL (ref 0.3–1.2)
Total Protein: 6.4 g/dL — ABNORMAL LOW (ref 6.5–8.1)

## 2020-02-25 LAB — ECHOCARDIOGRAM LIMITED
Height: 70.984 in
S' Lateral: 3.69 cm
Weight: 3806.02 oz

## 2020-02-25 LAB — GLUCOSE, CAPILLARY
Glucose-Capillary: 328 mg/dL — ABNORMAL HIGH (ref 70–99)
Glucose-Capillary: 307 mg/dL — ABNORMAL HIGH (ref 70–99)
Glucose-Capillary: 269 mg/dL — ABNORMAL HIGH (ref 70–99)

## 2020-02-25 LAB — MAGNESIUM: Magnesium: 3.2 mg/dL — ABNORMAL HIGH (ref 1.7–2.4)

## 2020-02-25 NOTE — Progress Notes (Signed)
*  PRELIMINARY RESULTS* Echocardiogram 2D Echocardiogram has been performed.  Sherrie Sport 02/25/2020, 9:17 AM

## 2020-02-25 NOTE — Progress Notes (Signed)
[place], [place] 02/25/20  Subjective:   Hospital day # 7 Remains critically ill  MVH:QIONGEXB- vasopressin Pulm: vent assisted Fio 60% Gi: OG in place, TF @ 40 / hr Renal: poor UOP- CRRT could not be run due to high dialyzer pressures. INR already high so not option of heparin IHD on 1/11 - for high K Neuro: sedated with versed, fentanyl 01/11 0701 - 01/12 0700 In: 1697.3 [I.V.:612.9; NG/GT:1084.3] Out: 504 [Urine:75] Lab Results  Component Value Date   CREATININE 8.23 (H) 02/25/2020   CREATININE 7.26 (H) 02/24/2020   CREATININE 9.18 (H) 02/24/2020     Objective:  Vital signs in last 24 hours:  Temp:  [96.26 F (35.7 C)-99.14 F (37.3 C)] 98.24 F (36.8 C) (01/12 0800) Pulse Rate:  [26-78] 52 (01/12 0800) Resp:  [12-25] 24 (01/12 0800) BP: (96-136)/(47-99) 116/60 (01/12 0800) SpO2:  [87 %-100 %] 99 % (01/12 0800) FiO2 (%):  [60 %-90 %] 60 % (01/12 0236) Weight:  [107.9 kg] 107.9 kg (01/12 0500)  Weight change:  Filed Weights   02/22/20 0425 02/23/20 0419 02/25/20 0500  Weight: 110.5 kg 107.5 kg 107.9 kg    Intake/Output:    Intake/Output Summary (Last 24 hours) at 02/25/2020 0944 Last data filed at 02/25/2020 0800 Gross per 24 hour  Intake 1545.63 ml  Output 504 ml  Net 1041.63 ml    Physical Exam: General:  critically ill, laying in the bed  HEENT  anicteric, ETT, OGT  Pulm/lungs  Vent assisted, coarse  CVS/Heart  Regular rhythm,  Abdomen:   Soft, nondistended  Extremities:  + dependent and  peripheral edema, left BKA  Neurologic:  sedated  Skin:  No acute rashes  Right IJ temp cath    Basic Metabolic Panel:  Recent Labs  Lab 02/20/20 0539 02/21/20 0230 02/21/20 1547 02/22/20 0628 02/22/20 1540 02/23/20 0320 02/24/20 0402 02/24/20 1616 02/25/20 0423  NA 127* 130* 131* 132* 135 136 134* 135 133*  K 5.3* 5.6* 6.1* 6.2* 4.4 5.5* 6.8* 5.3* 6.0*  CL 92* 92* 93* 95* 95* 97* 95* 95* 95*  CO2 20* 22 19* 18* 26  22 20* 25 24  GLUCOSE 343* 148* 123* 162* 133* 166* 282* 254* 337*  BUN 121* 140* 138* 126* 76* 100* 131* 101* 126*  CREATININE 4.86* 6.89* 6.96* 7.43* 5.19* 6.95* 9.18* 7.26* 8.23*  CALCIUM 8.0* 8.3* 8.2* 8.0* 7.8* 7.8* 7.3* 7.5* 7.1*  MG 2.7* 3.0*  --   --   --  3.0* 3.2*  --  3.2*  PHOS 6.6* 9.2* 9.1* 10.5* 6.5* 8.6*  --  8.5*  --      CBC: Recent Labs  Lab 02/21/20 0230 02/22/20 0628 02/23/20 0320 02/24/20 0402 02/25/20 0423  WBC 16.5* 15.6* 19.2* 18.0* 12.3*  NEUTROABS 14.8* 14.0* 17.2* 16.1* 11.0*  HGB 16.3 14.7 15.2 14.7 13.3  HCT 50.1 45.7 44.6 44.2 40.4  MCV 84.9 85.7 83.2 84.7 84.3  PLT 197 151 199 216 165      Lab Results  Component Value Date   HEPBSAG NON REACTIVE 02/22/2020   HEPBSAB NON REACTIVE 02/21/2020   HEPBIGM NON REACTIVE 02/21/2020      Microbiology:  Recent Results (from the past 240 hour(s))  Culture, blood (Routine X 2) w Reflex to ID Panel     Status: None   Collection Time: 03/14/2020  8:28 PM   Specimen: BLOOD  Result Value Ref Range Status   Specimen Description BLOOD RIGHT ANTECUBITAL  Final  Special Requests   Final    BOTTLES DRAWN AEROBIC AND ANAEROBIC Blood Culture results may not be optimal due to an excessive volume of blood received in culture bottles   Culture   Final    NO GROWTH 5 DAYS Performed at [facility], [address] [place], [place] [address]    Report Status 02/23/2020 FINAL  Final  Culture, blood (Routine X 2) w Reflex to ID Panel     Status: None   Collection Time: 02/28/2020  8:30 PM   Specimen: BLOOD  Result Value Ref Range Status   Specimen Description BLOOD BLOOD RIGHT HAND  Final   Special Requests   Final    BOTTLES DRAWN AEROBIC AND ANAEROBIC Blood Culture adequate volume   Culture   Final    NO GROWTH 5 DAYS Performed at [facility], [address] [place], [place] [address]    Report Status 02/23/2020 FINAL  Final  MRSA PCR Screening     Status: None   Collection Time:  03/01/2020 11:42 PM   Specimen: Nasopharyngeal  Result Value Ref Range Status   MRSA by PCR NEGATIVE NEGATIVE Final    Comment:        The GeneXpert MRSA Assay (FDA approved for NASAL specimens only), is one component of a comprehensive MRSA colonization surveillance program. It is not intended to diagnose MRSA infection nor to guide or monitor treatment for MRSA infections. Performed at [facility], [address] [place], [place] [address]     Coagulation Studies: Recent Labs    02/23/20 9381 02/24/20 0402 02/25/20 0423  LABPROT 44.9* 37.3* 39.3*  INR 5.0* 3.9* 4.2*    Urinalysis: No results for input(s): COLORURINE, LABSPEC, PHURINE, GLUCOSEU, HGBUR, BILIRUBINUR, KETONESUR, PROTEINUR, UROBILINOGEN, NITRITE, LEUKOCYTESUR in the last 72 hours.  Invalid input(s): APPERANCEUR    Imaging: DG Chest Port 1 View  Result Date: 02/24/2020 CLINICAL DATA:  Intubation EXAM: PORTABLE CHEST 1 VIEW COMPARISON:  Radiograph 02/23/2020 FINDINGS: *Endotracheal tube in the mid trachea, 5.8 cm from the carina. *Transesophageal tube tip below the margins of imaging with side port beyond the GE junction. *Dual lumen right IJ catheter tip terminates at the right atrium. *Prior sternotomy, CABG *Telemetry leads and external support devices overlie the chest. Diminished lung volumes from prior which may contribute to what appear to be increased opacities in the mid to lower lungs bilaterally. Chronic left fibrothorax. No discernible pleural effusions or pneumothorax. Enlarged cardiac silhouette, may be accentuated by low volumes and the portable technique IMPRESSION: Lines and tubes as above. Significantly diminished lung volumes which may contribute to increasing opacities in both lungs. Enlarged appearance of cardiac silhouette, likely accentuated by diminished volumes and portable technique. Electronically Signed   By: Lovena Le M.D.   On: 02/24/2020 03:53   DG Abd Portable  1V  Result Date: 02/23/2020 CLINICAL DATA:  Constipation EXAM: PORTABLE ABDOMEN - 1 VIEW COMPARISON:  February 21, 2020 FINDINGS: Nasogastric tube tip and side port in stomach. There is moderate stool in the colon. There is no bowel dilatation or air-fluid level to suggest bowel obstruction. No free air. Foci of vascular calcification noted in the pelvis. IMPRESSION: Moderate stool in colon. No bowel obstruction or free air. Nasogastric tube tip and side port in stomach. Electronically Signed   By: Lowella Grip III M.D.   On: 02/23/2020 14:39     Medications:   . sodium chloride Stopped (02/24/2020 2116)  . sodium chloride Stopped (02/20/20 1126)  .  fentaNYL infusion INTRAVENOUS 100 mcg/hr (02/25/20 0600)  . midazolam 2 mg/hr (02/25/20 0600)  . norepinephrine (LEVOPHED) Adult infusion Stopped (02/24/20 1808)  . phenylephrine (NEO-SYNEPHRINE) Adult infusion Stopped (02/24/20 1303)  . prismasol BGK 2/2.5 dialysis solution 1,500 mL/hr at 02/21/20 1919  . prismasol BGK 2/2.5 replacement solution 300 mL/hr at 02/21/20 1230  . prismasol BGK 2/2.5 replacement solution 300 mL/hr at 02/21/20 1230  . vasopressin 0.03 Units/min (02/25/20 0829)   . vitamin C  500 mg Per Tube Daily  . aspirin  81 mg Per Tube Daily  . atorvastatin  80 mg Per Tube Daily  . chlorhexidine gluconate (MEDLINE KIT)  15 mL Mouth Rinse BID  . Chlorhexidine Gluconate Cloth  6 each Topical Daily  . docusate  100 mg Per Tube BID  . feeding supplement (PROSource TF)  90 mL Per Tube QID  . feeding supplement (VITAL 1.5 CAL)  1,000 mL Per Tube Q24H  . fentaNYL (SUBLIMAZE) injection  25 mcg Intravenous Once  . folic acid  1 mg Per Tube Daily  . hydrocortisone sod succinate (SOLU-CORTEF) inj  50 mg Intravenous Q6H  . insulin aspart  0-20 Units Subcutaneous Q4H  . insulin aspart  5 Units Subcutaneous Q4H  . insulin glargine  25 Units Subcutaneous QHS  . mouth rinse  15 mL Mouth Rinse 10 times per day  . metoCLOPramide (REGLAN)  injection  5 mg Intravenous Q8H  . multivitamin with minerals  1 tablet Per Tube Daily  . pantoprazole (PROTONIX) IV  40 mg Intravenous Q12H  . polyethylene glycol  17 g Per Tube Daily  . senna-docusate  1 tablet Per Tube BID  . thiamine  100 mg Per Tube Daily  . zinc sulfate  220 mg Per Tube Daily   acetaminophen, chlorpheniramine-HYDROcodone, fentaNYL, fluticasone, gabapentin, guaiFENesin-dextromethorphan, heparin, midazolam, midazolam, morphine injection, sodium chloride  Assessment/ Plan:  65 y.o. male with diabetes type 2, hypertension, hyperlipidemia, coronary artery disease, obstructive sleep apnea, atrial fibrillation, history of right leg DVT 2015 January,   admitted on 03/13/2020 for COVID-19 [U07.1] STEMI (ST elevation myocardial infarction) ([place]) [I21.3]  #. ARF with hyperkalemia and acidosis Urinalysis from February 12, 2020 small hemoglobin, negative protein, 0-5 RBCs, 0-5 WBCs No renal imaging available at present CRRT started 02/21/2020-d/c 1/9 due to high dialyzer pressure and poor BFR  IHD on 1/11 for hyperkalemia and acidosis  1/12- repeat urgent  IHD for severe hyperkalemia  # severe hyperkalemia Lab Results  Component Value Date   K 6.0 (H) 02/25/2020     #. Acute hypoxic resp failure -intubated on 1/7 -Requiring ventilator support   - requiring pressors  #. Covid -19 pneumonia IV remdesivir start 03/09/2020 Solu-Medrol started 03/09/2020 Completed       LOS: [place] 1/12/20229:44 AM  [place], [place]  Note: This note was prepared with Dragon dictation. Any transcription errors are unintentional

## 2020-02-25 NOTE — Progress Notes (Signed)
Secure chat message sent to dr. Candiss Norse to notify pt bfr is currently 350. Pt with runs of a fib and nonconducting pacs on monitor.

## 2020-02-25 NOTE — Progress Notes (Signed)
Per dr. Candiss Norse reduce BFR to 300 and reduce temp to 35 degrees celcius. Butch Penny, rn reset dialysis machine to these parameters.

## 2020-02-25 NOTE — Progress Notes (Addendum)
Progress Note  Due to the COVID-19 pandemic, this visit was completed with telemedicine (audio/video) technology to reduce patient and provider exposure as well as to preserve personal protective equipment.   Patient Name: Bobby Lindsey Date of Encounter: 02/25/2020  Primary Cardiologist: Minus Breeding, MD   Subjective   Patient is currently intubated, sedated.  Inpatient Medications    Scheduled Meds: . vitamin C  500 mg Per Tube Daily  . aspirin  81 mg Per Tube Daily  . atorvastatin  80 mg Per Tube Daily  . chlorhexidine gluconate (MEDLINE KIT)  15 mL Mouth Rinse BID  . Chlorhexidine Gluconate Cloth  6 each Topical Daily  . docusate  100 mg Per Tube BID  . feeding supplement (PROSource TF)  90 mL Per Tube QID  . feeding supplement (VITAL 1.5 CAL)  1,000 mL Per Tube Q24H  . fentaNYL (SUBLIMAZE) injection  25 mcg Intravenous Once  . folic acid  1 mg Per Tube Daily  . hydrocortisone sod succinate (SOLU-CORTEF) inj  50 mg Intravenous Q6H  . insulin aspart  0-20 Units Subcutaneous Q4H  . insulin aspart  5 Units Subcutaneous Q4H  . insulin glargine  25 Units Subcutaneous QHS  . mouth rinse  15 mL Mouth Rinse 10 times per day  . metoCLOPramide (REGLAN) injection  5 mg Intravenous Q8H  . multivitamin with minerals  1 tablet Per Tube Daily  . pantoprazole (PROTONIX) IV  40 mg Intravenous Q12H  . polyethylene glycol  17 g Per Tube Daily  . senna-docusate  1 tablet Per Tube BID  . thiamine  100 mg Per Tube Daily  . zinc sulfate  220 mg Per Tube Daily   Continuous Infusions: . sodium chloride Stopped (02/22/2020 2116)  . sodium chloride Stopped (02/20/20 1126)  . fentaNYL infusion INTRAVENOUS 100 mcg/hr (02/25/20 0600)  . midazolam 2 mg/hr (02/25/20 0600)  . norepinephrine (LEVOPHED) Adult infusion Stopped (02/24/20 1808)  . phenylephrine (NEO-SYNEPHRINE) Adult infusion Stopped (02/24/20 1303)  . prismasol BGK 2/2.5 dialysis solution 1,500 mL/hr at 02/21/20 1919  . prismasol  BGK 2/2.5 replacement solution 300 mL/hr at 02/21/20 1230  . prismasol BGK 2/2.5 replacement solution 300 mL/hr at 02/21/20 1230  . vasopressin 0.03 Units/min (02/25/20 0829)   PRN Meds: acetaminophen, chlorpheniramine-HYDROcodone, fentaNYL, fluticasone, gabapentin, guaiFENesin-dextromethorphan, heparin, midazolam, midazolam, morphine injection, sodium chloride   Vital Signs    Vitals:   02/25/20 0615 02/25/20 0630 02/25/20 0700 02/25/20 0800  BP: 111/64 (!) 114/56 (!) 104/54 116/60  Pulse: (!) 53 (!) 52 (!) 52 (!) 52  Resp: (!) 24 (!) 24 (!) 24 (!) 24  Temp: 98.24 F (36.8 C) 98.24 F (36.8 C) 98.24 F (36.8 C) 98.24 F (36.8 C)  TempSrc:    Esophageal  SpO2: 100% 100% 100% 99%  Weight:      Height:        Intake/Output Summary (Last 24 hours) at 02/25/2020 1152 Last data filed at 02/25/2020 0800 Gross per 24 hour  Intake 1317.94 ml  Output 504 ml  Net 813.94 ml   Last 3 Weights 02/25/2020 02/23/2020 02/22/2020  Weight (lbs) 237 lb 14 oz 236 lb 15.9 oz 243 lb 9.7 oz  Weight (kg) 107.9 kg 107.5 kg 110.5 kg      Telemetry    Sinus bradycardia, heart rate 54, as low as 45 bpm last evening- Personally Reviewed  ECG    No new tracing- Personally Reviewed  Physical Exam   VITAL SIGNS:  reviewed  Labs  Chemistry Recent Labs  Lab 02/23/20 0320 02/24/20 0402 02/24/20 1616 02/25/20 0423  NA 136 134* 135 133*  K 5.5* 6.8* 5.3* 6.0*  CL 97* 95* 95* 95*  CO2 22 20* 25 24  GLUCOSE 166* 282* 254* 337*  BUN 100* 131* 101* 126*  CREATININE 6.95* 9.18* 7.26* 8.23*  CALCIUM 7.8* 7.3* 7.5* 7.1*  PROT 6.8 6.8  --  6.4*  ALBUMIN 2.4* 2.3* 2.4* 2.0*  AST 67* 78*  --  50*  ALT 29 31  --  29  ALKPHOS 86 89  --  80  BILITOT 1.1 1.0  --  0.8  GFRNONAA 8* 6* 8* 7*  ANIONGAP 17* 19* 15 14     Hematology Recent Labs  Lab 02/23/20 0320 02/24/20 0402 02/25/20 0423  WBC 19.2* 18.0* 12.3*  RBC 5.36 5.22 4.79  HGB 15.2 14.7 13.3  HCT 44.6 44.2 40.4  MCV 83.2 84.7 84.3   MCH 28.4 28.2 27.8  MCHC 34.1 33.3 32.9  RDW 15.9* 16.4* 15.9*  PLT 199 216 165    Cardiac EnzymesNo results for input(s): TROPONINI in the last 168 hours. No results for input(s): TROPIPOC in the last 168 hours.   BNPNo results for input(s): BNP, PROBNP in the last 168 hours.   DDimer  Recent Labs  Lab 02/21/20 0344 02/22/20 0628 02/23/20 0320  DDIMER 17.37* 6.27* 2.71*     Radiology    DG Chest Port 1 View  Result Date: 02/24/2020 CLINICAL DATA:  Intubation EXAM: PORTABLE CHEST 1 VIEW COMPARISON:  Radiograph 02/23/2020 FINDINGS: *Endotracheal tube in the mid trachea, 5.8 cm from the carina. *Transesophageal tube tip below the margins of imaging with side port beyond the GE junction. *Dual lumen right IJ catheter tip terminates at the right atrium. *Prior sternotomy, CABG *Telemetry leads and external support devices overlie the chest. Diminished lung volumes from prior which may contribute to what appear to be increased opacities in the mid to lower lungs bilaterally. Chronic left fibrothorax. No discernible pleural effusions or pneumothorax. Enlarged cardiac silhouette, may be accentuated by low volumes and the portable technique IMPRESSION: Lines and tubes as above. Significantly diminished lung volumes which may contribute to increasing opacities in both lungs. Enlarged appearance of cardiac silhouette, likely accentuated by diminished volumes and portable technique. Electronically Signed   By: Lovena Le M.D.   On: 02/24/2020 03:53   DG Abd Portable 1V  Result Date: 02/23/2020 CLINICAL DATA:  Constipation EXAM: PORTABLE ABDOMEN - 1 VIEW COMPARISON:  February 21, 2020 FINDINGS: Nasogastric tube tip and side port in stomach. There is moderate stool in the colon. There is no bowel dilatation or air-fluid level to suggest bowel obstruction. No free air. Foci of vascular calcification noted in the pelvis. IMPRESSION: Moderate stool in colon. No bowel obstruction or free air. Nasogastric  tube tip and side port in stomach. Electronically Signed   By: Lowella Grip III M.D.   On: 02/23/2020 14:39    Cardiac Studies   LHC (02/17/2020): STEMI presentation Negative vessels all 100% occluded proximally Patent LIMA to LAD Occluded SVGs to RCA and OM IRA SVG to diagonal 1 TIMI 0 flow thrombotic Unsuccessful PCI of SVG to diagonal Recommend conservative medical therapy  TTE (02/13/2020): 1. Images are very dfficult to evaluate even with Definity contrast.  There appears to be lateral wall hypokinesis, mostly matching the typical  distribution of the left circunflex coronary artery. Left ventricular  ejection fraction, by estimation, is 45  to 50%. The left ventricle  has mildly decreased function. The left  ventricle has no regional wall motion abnormalities. The left ventricular  internal cavity size was mildly dilated. There is mild eccentric left  ventricular hypertrophy. Left ventricular  diastolic parameters are consistent with Grade II diastolic dysfunction  (pseudonormalization). Elevated left atrial pressure. There is the  interventricular septum is flattened in systole, consistent with right  ventricular pressure overload.  2. Right ventricular systolic function is moderately reduced. The right  ventricular size is mildly enlarged. There is severely elevated pulmonary  artery systolic pressure.  3. Left atrial size was severely dilated.  4. The mitral valve is normal in structure. No evidence of mitral valve  regurgitation. No evidence of mitral stenosis.  5. Tricuspid valve regurgitation is mild to moderate.  6. The aortic valve is tricuspid. Aortic valve regurgitation is not  visualized. Mild aortic valve sclerosis is present, with no evidence of  aortic valve stenosis.  7. The inferior vena cava is normal in size with greater than 50%  respiratory variability, suggesting right atrial pressure of 3 mmHg.  Patient Profile     65 y.o. male with  history of CAD/CABG 2002, PCI's, PAD, hypertension, diabetes admitted with COVID-19 infection, diagnosed with pneumonia and STEMI.  Assessment & Plan    1.  Inferior STEMI, CAD/CABG -Opening of occluded SVG unsuccessful.  Multiple angioplasties performed. -Multiorgan failure, shock. -Heparin held due to coagulopathy. -Continue aspirin, statin for now.  Add Plavix with clinical improvement. -Last echo with mildly reduced EF.  Limited echo was ordered.  Will review.  2.  COVID-19 pneumonia,shock -Intubated, on pressors -Critical care team following. -Prognosis remain guarded.  Total encounter time35 minutes  Greater than 50% was spent in counseling and coordination of care with the patient     Signed, Kate Sable, MD  02/25/2020, 11:52 AM

## 2020-02-25 NOTE — Progress Notes (Signed)
Inpatient Diabetes Program Recommendations  AACE/ADA: New Consensus Statement on Inpatient Glycemic Control   Target Ranges:  Prepandial:   less than 140 mg/dL      Peak postprandial:   less than 180 mg/dL (1-2 hours)      Critically ill patients:  140 - 180 mg/dL   Results for IDA, UPPAL (MRN [id number]) as of 02/25/2020 09:12  Ref. Range 02/24/2020 07:51 02/24/2020 11:45 02/24/2020 16:14 02/24/2020 19:51 02/24/2020 23:17 02/25/2020 03:16 02/25/2020 07:58  Glucose-Capillary Latest Ref Range: 70 - 99 mg/dL 289 (H) 281 (H) 199 (H) 330 (H) 319 (H) 328 (H) 307 (H)   Review of Glycemic Control  Current orders for Inpatient glycemic control: Lantus 25 units QHS, Novolog 0-20 units Q4H, Novolog 5 units Q4H for tube feeding coverage; Solucortef 50 mg Q6H, Vital @ 40 ml/hr   Inpatient Diabetes Program Recommendations:    Insulin: Given than CBGs are now consistently over 300 mg/dl and patient is critically ill, on ventilator, steroids, and tube feedings, please consider discontinuing all SQ insulin orders and use ICU Glycemic Control Phase 2 IV insulin to improve glucose and help determine insulin needs.  Thanks, Barnie Alderman, RN, MSN, CDE Diabetes Coordinator Inpatient Diabetes Program [phone] (Team Pager from 8am to 5pm)

## 2020-02-25 NOTE — Progress Notes (Signed)
Bobby Ard, rn speaking with dr. Candiss Norse to notify of hypotension 79/50, per dr. Candiss Norse take pt off dialysis.

## 2020-02-25 NOTE — Progress Notes (Signed)
Per dr. Candiss Norse, ok to proceed with BFR 350.

## 2020-02-25 NOTE — Progress Notes (Signed)
Family arrived at hospital to see patient.  Drue Stager, NP had discussion with family in waiting room regarding patient condition and prognosis, all family questions answered at this time. Plan to defer goals of care discussion to day time intensivist team. Wife, two sons, and two daughters-in-law at patient bedside. Educated on proper PPE use and visitation time limits.

## 2020-02-25 NOTE — Progress Notes (Signed)
Updates given to wife Amy via phone. Explained patient status in detail and per Dr. Zoila Shutter note from today 02/25/20 that patient is in active dying process with multisystem organ failure. Wife had several questions regarding palliative extubation for patient - deferred to primary intensivist team.

## 2020-02-25 NOTE — Progress Notes (Signed)
CRITICAL CARE NOTE 65 yo male recently diagnosed with COVID-19 pneumonia on 12/30 which he required hospitalization at [facility] 12/30 to 01/3 now admitted with STEMI and acute hypoxic respiratory failure secondary to COVID-19 pneumonia requiring Bipap s/p cardiac catheterization with unsuccessful PCI and stent placement to SVG per cardiology recommending uninterrupted dual antiplatelet therapy  Cardiac cath revealed multiple occlusions, however PCI and stent placement to the SVG unsuccessful per cardiology recommending uninterrupted dual antiplatelet therapy for a minimum of 6 months.  Pt admitted to ICU post procedure due to Bipap requirement secondary to acute hypoxic respiratory failure in the setting of COVID-19 pneumonia.    Significant Hospital Events:  01/5: Pt admitted to ICU post cardiac cath 1/6 TRANSFERRED TO [facility]  1/7 Progressive resp failure and renal failure, Family made aware and reversed CODE status 1/7 patient was emergently intubated and placed on MV suport 1/7 vasc cath placed in anticipation for CRRT 1/8 severe resp failure, on CRRT, on pressors 1/10 remains on pressors, almost dies last night 2022/02/28 active dying process 1/12 remains on pressors, on vent    CC  follow up respiratory failure  SUBJECTIVE Patient remains critically ill Prognosis is guarded   BP (!) 114/56   Pulse (!) 52   Temp 98.24 F (36.8 C)   Resp (!) 24   Ht 5' 10.98" (1.803 m)   Wt 107.9 kg   SpO2 100%   BMI 33.19 kg/m    I/O last 3 completed shifts: In: 2321.5 [I.V.:1017.1; NG/GT:1304.3] Out: 504 [Urine:75; Other:429] No intake/output data recorded.  SpO2: 100 % O2 Flow Rate (L/min): 15 L/min FiO2 (%): 60 %  Estimated body mass index is 33.19 kg/m as calculated from the following:   Height as of this encounter: 5' 10.98" (1.803 m).   Weight as of this encounter: 107.9 kg.  SIGNIFICANT EVENTS   REVIEW OF SYSTEMS  PATIENT IS UNABLE TO PROVIDE COMPLETE REVIEW  OF SYSTEMS DUE TO SEVERE CRITICAL ILLNESS        PHYSICAL EXAMINATION:  GENERAL:critically ill appearing, +resp distress HEAD: Normocephalic, atraumatic.  EYES: Pupils equal, round, reactive to light.  No scleral icterus.  MOUTH: Moist mucosal membrane. NECK: Supple.  PULMONARY: +rhonchi, +wheezing CARDIOVASCULAR: S1 and S2. Regular rate and rhythm. No murmurs, rubs, or gallops.  GASTROINTESTINAL: Soft, nontender, -distended.  Positive bowel sounds.   MUSCULOSKELETAL: No swelling, clubbing, or edema.  NEUROLOGIC: obtunded, GCS<8 SKIN:intact,warm,dry  MEDICATIONS: I have reviewed all medications and confirmed regimen as documented   CULTURE RESULTS   Recent Results (from the past 240 hour(s))  Culture, blood (Routine X 2) w Reflex to ID Panel     Status: None   Collection Time: 02/16/2020  8:28 PM   Specimen: BLOOD  Result Value Ref Range Status   Specimen Description BLOOD RIGHT ANTECUBITAL  Final   Special Requests   Final    BOTTLES DRAWN AEROBIC AND ANAEROBIC Blood Culture results may not be optimal due to an excessive volume of blood received in culture bottles   Culture   Final    NO GROWTH 5 DAYS Performed at [facility], [address] [place], [place] [address]    Report Status 02/23/2020 FINAL  Final  Culture, blood (Routine X 2) w Reflex to ID Panel     Status: None   Collection Time: 02/29/2020  8:30 PM   Specimen: BLOOD  Result Value Ref Range Status   Specimen Description BLOOD BLOOD RIGHT HAND  Final   Special Requests  Final    BOTTLES DRAWN AEROBIC AND ANAEROBIC Blood Culture adequate volume   Culture   Final    NO GROWTH 5 DAYS Performed at [facility], [address] [place], [place] [address]    Report Status 02/23/2020 FINAL  Final  MRSA PCR Screening     Status: None   Collection Time: 03/12/2020 11:42 PM   Specimen: Nasopharyngeal  Result Value Ref Range Status   MRSA by PCR NEGATIVE NEGATIVE Final    Comment:        The  GeneXpert MRSA Assay (FDA approved for NASAL specimens only), is one component of a comprehensive MRSA colonization surveillance program. It is not intended to diagnose MRSA infection nor to guide or monitor treatment for MRSA infections. Performed at [facility], [address], [place], [place] [address]           IMAGING    No results found.   Nutrition Status: Nutrition Problem: Inadequate oral intake Etiology: inability to eat Signs/Symptoms: NPO status Interventions: Tube feeding,Prostat,MVI     Indwelling Urinary Catheter continued, requirement due to   Reason to continue Indwelling Urinary Catheter strict Intake/Output monitoring for hemodynamic instability   Central Line/ continued, requirement due to  Reason to continue [place] of central venous pressure or other hemodynamic parameters and poor IV access   Ventilator continued, requirement due to severe respiratory failure   Ventilator Sedation RASS 0 to -2      ASSESSMENT AND PLAN SYNOPSIS   65 yo withAcute hypoxic respiratory failure secondary to COVID-19 pneumoniaWith Multiorgan failure, very poor prognosis,Severe end stage ischemic cardiomyopathy and sCHF complicated by acute rena failure Multiorgan failure, very poor prognosis   Severe ACUTE Hypoxic and Hypercapnic Respiratory Failure -continue Full MV support -continue Bronchodilator Therapy -Wean Fio2 and PEEP as tolerated -VAP/VENT bundle implementation  ACUTE SYSTOLIC CARDIAC FAILURE- STEMI -follow up cardiology recs   Morbid obesity, possible OSA.   Will certainly impact respiratory mechanics, ventilator weaning Suspect will need to consider additional PEEP   ACUTE KIDNEY INJURY/Renal Failure -continue Foley Catheter-assess need -Avoid nephrotoxic agents -Follow urine output, BMP -Ensure adequate renal perfusion, optimize oxygenation -Renal dose medications Now on HD CRRT      NEUROLOGY Acute toxic metabolic encephalopathy, need for sedation Goal RASS -2 to -3  SHOCK-CARDIOGENIC -use vasopressors to keep MAP>65   CARDIAC ICU monitoring  ID -continue IV abx as prescibed -follow up cultures  GI GI PROPHYLAXIS as indicated   DIET-->TF's as tolerated Constipation protocol as indicated  ENDO - will use ICU hypoglycemic\Hyperglycemia protocol if indicated     ELECTROLYTES -follow labs as needed -replace as needed -pharmacy consultation and following   DVT/GI PRX ordered and assessed TRANSFUSIONS AS NEEDED MONITOR FSBS I Assessed the need for Labs I Assessed the need for Foley I Assessed the need for Central Venous Line Family Discussion when available I Assessed the need for Mobilization I made an Assessment of medications to be adjusted accordingly Safety Risk assessment completed   CASE DISCUSSED IN MULTIDISCIPLINARY ROUNDS WITH ICU TEAM  Critical Care Time devoted to patient care services described in this note is 45 minutes.   Overall, patient is critically ill, prognosis is guarded.  Patient with Multiorgan failure and at high risk for cardiac arrest and death.   Patient is DNR, patient with multiorgan failure Prognosis is very poor  Corrin Parker, M.D.  [facility]  Medical Director [place] Director [facility] Cardio-Pulmonary Department

## 2020-02-25 NOTE — Progress Notes (Signed)
CRITICAL VALUE ALERT  Critical Value:  INR  Date & Time Notied:  02/25/20 @ 0233  Provider Notified: Tonye Royalty, NP  Orders Received/Actions taken: No new orders at this time.

## 2020-02-25 NOTE — Progress Notes (Signed)
Lines reveresed due to low arterial pressure. 2000 units of heparin given by Caren Macadam, rn for beginning of clotting occurring in lines.

## 2020-02-25 NOTE — Progress Notes (Signed)
UF turned off due to blood pressure 95/62, daveena smith, rn notifng dr. Candiss Norse.

## 2020-02-25 NOTE — Progress Notes (Signed)
Bobby Lindsey met two family members briefly in ICU waiting rm.  No needs chared at this time, but family is aware of chaplain availability if needed.

## 2020-02-26 LAB — CBC WITH DIFFERENTIAL/PLATELET
Abs Immature Granulocytes: 0.17 10*3/uL — ABNORMAL HIGH (ref 0.00–0.07)
Basophils Absolute: 0 10*3/uL (ref 0.0–0.1)
Basophils Relative: 0 %
Eosinophils Absolute: 0 10*3/uL (ref 0.0–0.5)
Eosinophils Relative: 0 %
HCT: 43.1 % (ref 39.0–52.0)
Hemoglobin: 14.1 g/dL (ref 13.0–17.0)
Immature Granulocytes: 1 %
Lymphocytes Relative: 3 %
Lymphs Abs: 0.4 10*3/uL — ABNORMAL LOW (ref 0.7–4.0)
MCH: 27.6 pg (ref 26.0–34.0)
MCHC: 32.7 g/dL (ref 30.0–36.0)
MCV: 84.5 fL (ref 80.0–100.0)
Monocytes Absolute: 0.7 10*3/uL (ref 0.1–1.0)
Monocytes Relative: 4 %
Neutro Abs: 14.2 10*3/uL — ABNORMAL HIGH (ref 1.7–7.7)
Neutrophils Relative %: 92 %
Platelets: 214 10*3/uL (ref 150–400)
RBC: 5.1 MIL/uL (ref 4.22–5.81)
RDW: 16.1 % — ABNORMAL HIGH (ref 11.5–15.5)
WBC: 15.5 10*3/uL — ABNORMAL HIGH (ref 4.0–10.5)
nRBC: 0 % (ref 0.0–0.2)

## 2020-02-26 LAB — GLUCOSE, CAPILLARY
Glucose-Capillary: 196 mg/dL — ABNORMAL HIGH (ref 70–99)
Glucose-Capillary: 199 mg/dL — ABNORMAL HIGH (ref 70–99)
Glucose-Capillary: 203 mg/dL — ABNORMAL HIGH (ref 70–99)
Glucose-Capillary: 220 mg/dL — ABNORMAL HIGH (ref 70–99)
Glucose-Capillary: 235 mg/dL — ABNORMAL HIGH (ref 70–99)
Glucose-Capillary: 273 mg/dL — ABNORMAL HIGH (ref 70–99)

## 2020-02-26 LAB — PROTIME-INR
INR: 2.6 — ABNORMAL HIGH (ref 0.8–1.2)
Prothrombin Time: 27.1 seconds — ABNORMAL HIGH (ref 11.4–15.2)

## 2020-02-26 LAB — MAGNESIUM: Magnesium: 3.3 mg/dL — ABNORMAL HIGH (ref 1.7–2.4)

## 2020-02-26 MED ORDER — MIDAZOLAM HCL 2 MG/2ML IJ SOLN
INTRAMUSCULAR | Status: AC
  Administered 2020-02-26: 4 mg via INTRAVENOUS
  Filled 2020-02-26: qty 4

## 2020-02-26 MED ORDER — MIDAZOLAM HCL 2 MG/2ML IJ SOLN: 4.0000 mg | Freq: Once | INTRAMUSCULAR | Status: AC

## 2020-03-01 ENCOUNTER — Institutional Professional Consult (permissible substitution): Payer: Medicare HMO | Admitting: Pulmonary Disease

## 2020-03-12 ENCOUNTER — Ambulatory Visit: Payer: Medicare HMO | Admitting: Pulmonary Disease

## 2020-03-16 NOTE — Progress Notes (Signed)
[facility] encountered pt.'s sister and br. in law in ICU hallway; [place] helped them don PPE for visit w/pt. as he transitions.  [employer] also checked in w/family in ICU waiting room --> family pastor present, no further needs at this time.

## 2020-03-16 NOTE — Death Summary Note (Signed)
DEATH SUMMARY   Patient Details  Name: Bobby Lindsey MRN: [id number] DOB: 15-May-1955  Admission/Discharge Information   Admit Date:  07-Mar-2020  Date of Death:   2020/03/15   Time of Death:  1154AM  Length of Stay: 8  Referring Physician: Lynnea Ferrier, MD   Reason(s) for Hospitalization  COVID 19 pneumonia  Diagnoses  Preliminary cause of death: COVID 19 pneumonia, ISCHEMIC CARDIOMYOPATHY Secondary Diagnoses (including complications and co-morbidities):  Active Problems:   COVID-19   STEMI (ST elevation myocardial infarction) (HCC)   Acute kidney injury superimposed on CKD [facility])   Brief Hospital Course (including significant findings, care, treatment, and services provided and events leading to death)   65 yo male recently diagnosed with COVID-19 pneumonia on 12/30 which he required hospitalization at [facility] 12/30 to 01/3 now admitted with STEMI and acute hypoxic respiratory failure secondary to COVID-19 pneumonia requiring Bipap s/p cardiac catheterization with unsuccessful PCI and stent placement to SVG per cardiology recommending uninterrupted dual antiplatelet therapy  Cardiac cath revealed multiple occlusions, however PCI and stent placement to the SVG unsuccessful per cardiology recommending uninterrupted dual antiplatelet therapy for a minimum of 6 months.  Pt admitted to ICU post procedure due to Bipap requirement secondary to acute hypoxic respiratory failure in the setting of COVID-19 pneumonia.    Significant Hospital Events:  03/07/2022: Pt admitted to ICU post cardiac cath 1/6 TRANSFERRED TO [facility]  1/7 Progressive resp failure and renal failure, Family made aware and reversed CODE status 1/7 patient was emergently intubated and placed on MV suport 1/7 vasc cath placed in anticipation for CRRT 1/8 severe resp failure, on CRRT, on pressors 1/10 remains on pressors, almost dies last night 2022-03-13 active dying process, patient was made DNR 1/12 remains  on pressors, on vent 15-Mar-2022 severe ARDS, multiorgan failure  DX of  Multiorgan failure, very poor prognosis,Severe end stage ischemic cardiomyopathy and sCHF complicated by acute rena failure Multiorgan failure, very poor prognosis   Overall, patient is critically ill, prognosis is guarded.  Patient with Multiorgan failure and at high risk for cardiac arrest and death.   Patient passed away at 1154AM 2020/03/15  Pertinent Labs and Studies  Significant Diagnostic Studies DG Chest 1 View  Result Date: 02/21/2020 CLINICAL DATA:  Initial evaluation for central line placement. EXAM: CHEST  1 VIEW COMPARISON:  Prior radiograph from earlier the same day. FINDINGS: The right IJ approach central venous catheter has been retracted, with tip now overlying the proximal right atrium. Enteric tube remains in place, tip well above the carina. Enteric tube courses into the abdomen. Stable cardiomegaly. Mediastinal silhouette normal. Diffuse related pad overlies the left chest. Lungs hypoinflated. Extensive bilateral airspace disease is little interval change from previous. Small left pleural effusion. No pneumothorax. Osseous structures are unchanged. IMPRESSION: 1. Interval retraction of right IJ approach central venous catheter, with tip now overlying the proximal right atrium. 2. Otherwise stable appearance of the chest with extensive bilateral airspace disease and small left pleural effusion. Electronically Signed   By: Rise Mu M.D.   On: 02/21/2020 02:52   DG Chest 2 View  Result Date: 02/11/2020 CLINICAL DATA:  Shortness of breath and chest tightness for 1 day. EXAM: CHEST - 2 VIEW COMPARISON:  02/19/2019, CT 06/17/2018 FINDINGS: Post median sternotomy and CABG. Chronic cardiomegaly. There is peribronchial thickening with mild interstitial accentuation. Chronic left basilar pleural thickening with probable prior to talk pleurodesis. Mild left basilar scarring. No pneumothorax. No acute osseous  abnormalities are  seen. IMPRESSION: 1. Chronic cardiomegaly. 2. Peribronchial thickening with mild interstitial accentuation, suspicious for pulmonary edema. 3. Left basilar scarring. Electronically Signed   By: Narda [name] M.D.   On: 02/11/2020 18:34   DG Abd 1 View  Result Date: 02/21/2020 CLINICAL DATA:  Orogastric tube placement. EXAM: ABDOMEN - 1 VIEW COMPARISON:  None. FINDINGS: Tip and side port of the enteric tube below the diaphragm in the stomach. Air throughout nondilated large and small bowel. Small volume of colonic stool. IMPRESSION: Tip and side port of the enteric tube below the diaphragm in the stomach. Electronically Signed   By: Narda [name] M.D.   On: 02/21/2020 01:46   CARDIAC CATHETERIZATION  Result Date: 02/19/2020  Prox LAD lesion is 100% stenosed.  Prox Cx to Mid Cx lesion is 100% stenosed.  Prox RCA lesion is 99% stenosed.  Dist RCA lesion is 100% stenosed.  Ost RCA to Prox RCA lesion is 50% stenosed.  Prox RCA to Mid RCA lesion is 75% stenosed.  Ost Cx to Prox Cx lesion is 50% stenosed.  Origin lesion is 100% stenosed.  Origin to Dist Graft lesion is 100% stenosed.  Origin lesion is 100% stenosed.  Mid LAD to Dist LAD lesion is 50% stenosed.  Conclusion STEMI presentation Negative vessels all 100% occluded proximally Patent LIMA to LAD Occluded SVGs to RCA and OM IRA SVG to diagonal 1 TIMI 0 flow thrombotic Unsuccessful PCI of SVG to diagonal Recommend conservative medical therapy   DG Chest Port 1 View  Result Date: 02/24/2020 CLINICAL DATA:  Intubation EXAM: PORTABLE CHEST 1 VIEW COMPARISON:  Radiograph 02/23/2020 FINDINGS: *Endotracheal tube in the mid trachea, 5.8 cm from the carina. *Transesophageal tube tip below the margins of imaging with side port beyond the GE junction. *Dual lumen right IJ catheter tip terminates at the right atrium. *Prior sternotomy, CABG *Telemetry leads and external support devices overlie the chest. Diminished lung volumes from  prior which may contribute to what appear to be increased opacities in the mid to lower lungs bilaterally. Chronic left fibrothorax. No discernible pleural effusions or pneumothorax. Enlarged cardiac silhouette, may be accentuated by low volumes and the portable technique IMPRESSION: Lines and tubes as above. Significantly diminished lung volumes which may contribute to increasing opacities in both lungs. Enlarged appearance of cardiac silhouette, likely accentuated by diminished volumes and portable technique. Electronically Signed   By: Kreg Shropshire M.D.   On: 02/24/2020 03:53   DG Chest Port 1 View  Result Date: 02/23/2020 CLINICAL DATA:  Acute respiratory failure EXAM: PORTABLE CHEST 1 VIEW COMPARISON:  Yesterday FINDINGS: Endotracheal tube with tip just below the clavicular heads. Enteric tube reaches the stomach. Dialysis catheter with tip at the lower SVC. Improved lung volumes, partially related to differences in positioning. Continued interstitial and reticular opacities which could be from patient's COVID or failure. Left fibrothorax. No pneumothorax. IMPRESSION: 1. Unremarkable hardware positioning. 2. Improved aeration since yesterday. Electronically Signed   By: Marnee Spring M.D.   On: 02/23/2020 04:06   DG Chest Port 1 View  Result Date: 02/22/2020 CLINICAL DATA:  Acute respiratory failure EXAM: PORTABLE CHEST 1 VIEW COMPARISON:  Yesterday FINDINGS: Cardiomegaly and vascular pedicle widening. Endotracheal tube with tip halfway between the clavicular heads and carina. Dialysis catheter with tip at the upper cavoatrial junction. Enteric tube with tip at the distal stomach. Confluent interstitial and airspace opacity. Lung volumes have possibly improved. CABG. IMPRESSION: 1. Stable hardware positioning. 2. Possible improvement in lung volumes. Pulmonary opacification remains  extensive. Electronically Signed   By: Marnee Spring M.D.   On: 02/22/2020 07:16   DG Chest Port 1 View  Result  Date: 02/21/2020 CLINICAL DATA:  Central line placement, acute respiratory failure EXAM: PORTABLE CHEST 1 VIEW COMPARISON:  Radiograph 02/19/2019, CT 06/17/2018 FINDINGS: Right IJ catheter tip terminates deep quite deep within the right atrium (see annotated image). The endotracheal tube terminates in the mid trachea, 5.5 cm from the carina. Transesophageal tube tip and side port terminate distal to the GE junction. Telemetry leads and external support devices overlie the chest. Some interval clearing in the left lung base may reflect improved aeration though there is increasingly coalescent opacity and air bronchograms in the right mid lung and retrocardiac space. No pneumothorax. Suspect small bilateral effusions. Marked cardiomegaly is similar to prior though portions of the cardiac silhouette are silhouetted by overlying opacity. 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 IMPRESSION: 1. Right IJ catheter tip terminates deep quite deep within the right atrium. Recommend retraction approximately 9 cm to position towards the superior cavoatrial junction with reimaging to confirm placement. 2. Satisfactory positioning of the endotracheal and transesophageal tubes. 3. Improved aeration of the left lung base but with increasingly coalescent opacity and air bronchograms in the right mid lung and retrocardiac space could reflect worsening airspace disease and/or edema. 4. Suspect small bilateral effusions. 5. Marked cardiomegaly. Electronically Signed   By: Kreg Shropshire M.D.   On: 02/21/2020 01:47   DG Chest Port 1 View  Result Date: 02/16/2020 CLINICAL DATA:  65 year old male with acute respiratory failure. EXAM: PORTABLE CHEST 1 VIEW COMPARISON:  Chest radiograph dated 02/11/2020. FINDINGS: Bilateral pulmonary opacities, new since the prior radiograph and most concerning for pneumonia. Clinical correlation and follow-up to resolution recommended. No large pleural effusion or pneumothorax. Stable  cardiomegaly. Median sternotomy wires. No acute osseous pathology. IMPRESSION: Bilateral pulmonary opacities most concerning for pneumonia. Electronically Signed   By: Elgie Collard M.D.   On: 02/27/2020 20:19   DG Abd Portable 1V  Result Date: 02/23/2020 CLINICAL DATA:  Constipation EXAM: PORTABLE ABDOMEN - 1 VIEW COMPARISON:  February 21, 2020 FINDINGS: Nasogastric tube tip and side port in stomach. There is moderate stool in the colon. There is no bowel dilatation or air-fluid level to suggest bowel obstruction. No free air. Foci of vascular calcification noted in the pelvis. IMPRESSION: Moderate stool in colon. No bowel obstruction or free air. Nasogastric tube tip and side port in stomach. Electronically Signed   By: Bretta Bang III M.D.   On: 02/23/2020 14:39   ECHOCARDIOGRAM COMPLETE  Result Date: 02/13/2020    ECHOCARDIOGRAM REPORT   Patient Name:   Bobby Lindsey Quinto Date of Exam: 02/13/2020 Medical Rec #:  [id number]       Height:       72.0 in Accession #:    [id number]      Weight:       266.0 lb Date of Birth:  [birth year]      BSA:          2.405 m Patient Age:    64 years        BP:           120/103 mmHg Patient Gender: M               HR:           50 bpm. Exam Location:  Inpatient Procedure: 2D Echo, Color Doppler, Cardiac Doppler and Intracardiac  Opacification Agent Indications:    Dyspnea R06.00  History:        Patient has prior history of Echocardiogram examinations, most                 recent 02/20/2019. CHF, CAD and Previous Myocardial Infarction,                 COPD, Arrythmias:Atrial Fibrillation,                 Signs/Symptoms:Bradycardia; Risk Factors:Hypertension, Diabetes,                 Dyslipidemia and Sleep Apnea. COVID 19. Chronic kidney disease.                 History of pulmonary embolism.  Sonographer:    Leta Jungling RDCS Referring Phys: [id number] [facility]  Sonographer Comments: Image acquisition challenging due to uncooperative patient.  IMPRESSIONS  1. Images are very dfficult to evaluate even with Definity contrast. There appears to be lateral wall hypokinesis, mostly matching the typical distribution of the left circunflex coronary artery. Left ventricular ejection fraction, by estimation, is 45 to 50%. The left ventricle has mildly decreased function. The left ventricle has no regional wall motion abnormalities. The left ventricular internal cavity size was mildly dilated. There is mild eccentric left ventricular hypertrophy. Left ventricular diastolic parameters are consistent with Grade II diastolic dysfunction (pseudonormalization). Elevated left atrial pressure. There is the interventricular septum is flattened in systole, consistent with right ventricular pressure overload.  2. Right ventricular systolic function is moderately reduced. The right ventricular size is mildly enlarged. There is severely elevated pulmonary artery systolic pressure.  3. Left atrial size was severely dilated.  4. The mitral valve is normal in structure. No evidence of mitral valve regurgitation. No evidence of mitral stenosis.  5. Tricuspid valve regurgitation is mild to moderate.  6. The aortic valve is tricuspid. Aortic valve regurgitation is not visualized. Mild aortic valve sclerosis is present, with no evidence of aortic valve stenosis.  7. The inferior vena cava is normal in size with greater than 50% respiratory variability, suggesting right atrial pressure of 3 mmHg. Comparison(s): Prior images reviewed side by side. The left ventricular function is worsened. The left ventricular wall motion abnormality is worse. However, the confidence of the assessment is lowered by image quality. Consider repeat evaluation when the patient is able to cooperate. FINDINGS  Left Ventricle: Images are very dfficult to evaluate even with Definity contrast. There appears to be lateral wall hypokinesis, mostly matching the typical distribution of the left circunflex coronary  artery. Left ventricular ejection fraction, by estimation, is 45 to 50%. The left ventricle has mildly decreased function. The left ventricle has no regional wall motion abnormalities. Definity contrast agent was given IV to delineate the left ventricular endocardial borders. The left ventricular internal cavity size was mildly dilated. There is mild eccentric left ventricular hypertrophy. The interventricular septum is flattened in systole, consistent with right ventricular pressure overload. Left ventricular diastolic parameters are consistent with Grade II diastolic dysfunction (pseudonormalization). Elevated left atrial pressure.  LV Wall Scoring: The antero-lateral wall, posterior wall, and basal inferior segment are hypokinetic. The entire anterior wall, entire septum, entire apex, and mid and distal inferior wall are normal. Right Ventricle: The right ventricular size is mildly enlarged. No increase in right ventricular wall thickness. Right ventricular systolic function is moderately reduced. There is severely elevated pulmonary artery systolic pressure. The tricuspid regurgitant velocity is 4.01 m/s,  and with an assumed right atrial pressure of 8 mmHg, the estimated right ventricular systolic pressure is 72.3 mmHg. Left Atrium: Left atrial size was severely dilated. Right Atrium: Right atrial size was not well visualized. Pericardium: There is no evidence of pericardial effusion. Mitral Valve: The mitral valve is normal in structure. Mild to moderate mitral annular calcification. No evidence of mitral valve regurgitation. No evidence of mitral valve stenosis. Tricuspid Valve: The tricuspid valve is normal in structure. Tricuspid valve regurgitation is mild to moderate. No evidence of tricuspid stenosis. Aortic Valve: The aortic valve is tricuspid. Aortic valve regurgitation is not visualized. Mild aortic valve sclerosis is present, with no evidence of aortic valve stenosis. Pulmonic Valve: The pulmonic  valve was normal in structure. Pulmonic valve regurgitation is not visualized. No evidence of pulmonic stenosis. Aorta: The aortic root is normal in size and structure. Venous: The inferior vena cava was not well visualized. The inferior vena cava is normal in size with greater than 50% respiratory variability, suggesting right atrial pressure of 3 mmHg. IAS/Shunts: The interatrial septum was not well visualized.  LEFT VENTRICLE PLAX 2D LVIDd:         6.10 cm  Diastology LVIDs:         4.20 cm  LV e' medial:    4.03 cm/s LV PW:         1.30 cm  LV E/e' medial:  30.5 LV IVS:        1.40 cm  LV e' lateral:   4.79 cm/s LVOT diam:     2.00 cm  LV E/e' lateral: 25.7 LV SV:         37 LV SV Index:   16 LVOT Area:     3.14 cm  LEFT ATRIUM         Index LA diam:    5.00 cm 2.08 cm/m  AORTIC VALVE LVOT Vmax:   64.60 cm/s LVOT Vmean:  47.500 cm/s LVOT VTI:    0.119 m  AORTA Ao Root diam: 2.60 cm MITRAL VALVE                TRICUSPID VALVE MV Area (PHT): 4.39 cm     TR Peak grad:   64.3 mmHg MV Decel Time: 173 msec     TR Vmax:        401.00 cm/s MV E velocity: 123.00 cm/s MV A velocity: 50.60 cm/s   SHUNTS MV E/A ratio:  2.43         Systemic VTI:  0.12 m                             Systemic Diam: 2.00 cm Thurmon Fair MD Electronically signed by Thurmon Fair MD Signature Date/Time: 02/13/2020/12:29:05 PM    Final    ECHOCARDIOGRAM LIMITED  Result Date: 02/25/2020    ECHOCARDIOGRAM LIMITED REPORT   Patient Name:   Bobby Lindsey Kennan Date of Exam: 02/25/2020 Medical Rec #:  [id number]       Height:       71.0 in Accession #:    [id number]      Weight:       237.9 lb Date of Birth:  04-03-1955      BSA:          2.270 m Patient Age:    64 years        BP:           116.62/62  mmHg Patient Gender: M               HR:           52 bpm. Exam Location:  [facility] Procedure: Limited Echo, Color Doppler and Cardiac Doppler Indications:     Acute myocardial infarction -unspecified I21.9  History:         Patient has prior history of  Echocardiogram examinations, most                  recent 02/13/2020. Arrythmias:Atrial Fibrillation; Risk                  Factors:Hypertension and Diabetes. Pulmonary embolism.  Sonographer:     Cristela Blue RDCS (AE) Referring Phys:  [id number] [address] END Diagnosing Phys: Debbe Odea MD  Sonographer Comments: Echo performed with patient supine and on artificial respirator, no apical window and Technically challenging study due to limited acoustic windows. IMPRESSIONS  1. Left ventricular ejection fraction, by estimation, is 45 to 50%. The left ventricle has mildly decreased function.  2. There is no evidence of pericardial effusion. Conclusion(s)/Recommendation(s): No evidence for pericardial effusion. FINDINGS  Left Ventricle: Left ventricular ejection fraction, by estimation, is 45 to 50%. The left ventricle has mildly decreased function. Pericardium: There is no evidence of pericardial effusion. LEFT VENTRICLE PLAX 2D LVIDd:         4.55 cm LVIDs:         3.69 cm LV PW:         1.36 cm LV IVS:        1.54 cm LVOT diam:     2.10 cm LVOT Area:     3.46 cm  LEFT ATRIUM         Index LA diam:    4.80 cm 2.11 cm/m                        PULMONIC VALVE AORTA                 PV Vmax:        0.80 m/s Ao Root diam: 3.00 cm PV Peak grad:   2.6 mmHg                       RVOT Peak grad: 2 mmHg  TRICUSPID VALVE TR Peak grad:   31.8 mmHg TR Vmax:        282.00 cm/s  SHUNTS Systemic Diam: 2.10 cm Debbe Odea MD Electronically signed by Debbe Odea MD Signature Date/Time: 02/25/2020/3:22:56 PM    Final     Microbiology Recent Results (from the past 240 hour(s))  Culture, blood (Routine X 2) w Reflex to ID Panel     Status: None   Collection Time: 02-24-20  8:28 PM   Specimen: BLOOD  Result Value Ref Range Status   Specimen Description BLOOD RIGHT ANTECUBITAL  Final   Special Requests   Final    BOTTLES DRAWN AEROBIC AND ANAEROBIC Blood Culture results may not be optimal due to an excessive volume  of blood received in culture bottles   Culture   Final    NO GROWTH 5 DAYS Performed at [facility], [address], [place], [place] [address]    Report Status 02/23/2020 FINAL  Final  Culture, blood (Routine X 2) w Reflex to ID Panel     Status: None   Collection Time: Feb 24, 2020  8:30 PM   Specimen: BLOOD  Result Value Ref Range Status   Specimen Description BLOOD BLOOD RIGHT HAND  Final   Special Requests   Final    BOTTLES DRAWN AEROBIC AND ANAEROBIC Blood Culture adequate volume   Culture   Final    NO GROWTH 5 DAYS Performed at [facility], [address] [place], [place] [address]    Report Status 02/23/2020 FINAL  Final  MRSA PCR Screening     Status: None   Collection Time: February 23, 2020 11:42 PM   Specimen: Nasopharyngeal  Result Value Ref Range Status   MRSA by PCR NEGATIVE NEGATIVE Final    Comment:        The GeneXpert MRSA Assay (FDA approved for NASAL specimens only), is one component of a comprehensive MRSA colonization surveillance program. It is not intended to diagnose MRSA infection nor to guide or monitor treatment for MRSA infections. Performed at [facility], [address] [place], [place] [address]     Lab Basic Metabolic Panel: Recent Labs  Lab 02/21/20 0230 02/21/20 1547 02/22/20 0628 02/22/20 1540 02/23/20 0320 02/24/20 0402 02/24/20 1616 02/25/20 0423 02/25/20 1600 02/19/2020 0544  NA 130*   < > 132* 135 136 134* 135 133* 136  --   K 5.6*   < > 6.2* 4.4 5.5* 6.8* 5.3* 6.0* 5.5*  --   CL 92*   < > 95* 95* 97* 95* 95* 95* 95*  --   CO2 22   < > 18* 26 22 20* 25 24 24   --   GLUCOSE 148*   < > 162* 133* 166* 282* 254* 337* 283*  --   BUN 140*   < > 126* 76* 100* 131* 101* 126* 113*  --   CREATININE 6.89*   < > 7.43* 5.19* 6.95* 9.18* 7.26* 8.23* 7.38*  --   CALCIUM 8.3*   < > 8.0* 7.8* 7.8* 7.3* 7.5* 7.1* 7.6*  --   MG 3.0*  --   --   --  3.0* 3.2*  --  3.2*  --  3.3*  PHOS 9.2*   < > 10.5* 6.5* 8.6*  --   8.5*  --  7.8*  --    < > = values in this interval not displayed.   Liver Function Tests: Recent Labs  Lab 02/21/20 0230 02/21/20 1547 02/22/20 0628 02/22/20 1540 02/23/20 0320 02/24/20 0402 02/24/20 1616 02/25/20 0423 02/25/20 1600  AST 68*  --  57*  --  67* 78*  --  50*  --   ALT 38  --  30  --  29 31  --  29  --   ALKPHOS 84  --  80  --  86 89  --  80  --   BILITOT 1.2  --  1.3*  --  1.1 1.0  --  0.8  --   PROT 7.5  --  6.5  --  6.8 6.8  --  6.4*  --   ALBUMIN 2.9*   < > 2.4*   < > 2.4* 2.3* 2.4* 2.0* 2.1*   < > = values in this interval not displayed.   No results for input(s): LIPASE, AMYLASE in the last 168 hours. No results for input(s): AMMONIA in the last 168 hours. CBC: Recent Labs  Lab 02/22/20 0628 02/23/20 0320 02/24/20 0402 02/25/20 0423 03/07/2020 0544  WBC 15.6* 19.2* 18.0* 12.3* 15.5*  NEUTROABS 14.0* 17.2* 16.1* 11.0* 14.2*  HGB 14.7 15.2 14.7 13.3 14.1  HCT 45.7 44.6 44.2  40.4 43.1  MCV 85.7 83.2 84.7 84.3 84.5  PLT 151 199 216 165 214   Cardiac Enzymes: No results for input(s): CKTOTAL, CKMB, CKMBINDEX, TROPONINI in the last 168 hours. Sepsis Labs: Recent Labs  Lab 02/23/20 0320 02/24/20 0402 02/25/20 0423 02-Mar-2020 0544  WBC 19.2* 18.0* 12.3* 15.5*      Shekinah Pitones 03-02-20, 12:51 PM

## 2020-03-16 NOTE — Progress Notes (Signed)
[place], [place] March 05, 2020  Subjective:   Hospital day # 8 Remains critically ill   1/11- IHD - for high K 1/12- IHD - for high K- unstable with drop in BP to 79/50. treatment stopped after 2 hours; vent 60% 1/13-  sedated with versed, fentanyl; pressors vasopressin. Vent fio2 100%. TF @40 /hr 01/12 0701 - 01/13 0700 In: 1176.3 [I.V.:376.3; NG/GT:800] Out: 15  Lab Results  Component Value Date   CREATININE 7.38 (H) 02/25/2020   CREATININE 8.23 (H) 02/25/2020   CREATININE 7.26 (H) 02/24/2020     Objective:  Vital signs in last 24 hours:  Temp:  [97.88 F (36.6 C)-99.86 F (37.7 C)] 99.86 F (37.7 C) (01/13 0900) Pulse Rate:  [27-149] 83 (01/13 0900) Resp:  [11-28] 21 (01/13 0900) BP: (79-146)/(49-86) 111/49 (01/13 0900) SpO2:  [88 %-100 %] 97 % (01/13 0900) FiO2 (%):  [40 %-100 %] 100 % (01/13 0743) Weight:  [105.7 kg] 105.7 kg (01/13 0500)  Weight change: -2.2 kg Filed Weights   02/23/20 0419 02/25/20 0500 03/05/2020 0500  Weight: 107.5 kg 107.9 kg 105.7 kg    Intake/Output:    Intake/Output Summary (Last 24 hours) at 05-Mar-2020 0928 Last data filed at 03/05/20 0800 Gross per 24 hour  Intake 1631.17 ml  Output 55 ml  Net 1576.17 ml    Physical Exam: General:  critically ill, laying in the bed  HEENT  anicteric, ETT, OGT  Pulm/lungs  Vent assisted, coarse  CVS/Heart  Regular rhythm,  Abdomen:   Soft, nondistended  Extremities:  + dependent and  peripheral edema, left BKA  Neurologic:  sedated  Skin:  No acute rashes  Right IJ temp cath Foley in place with dark yellow urine   Basic Metabolic Panel:  Recent Labs  Lab 02/21/20 0230 02/21/20 1547 02/22/20 0628 02/22/20 1540 02/23/20 0320 02/24/20 0402 02/24/20 1616 02/25/20 0423 02/25/20 1600 March 05, 2020 0544  NA 130*   < > 132* 135 136 134* 135 133* 136  --   K 5.6*   < > 6.2* 4.4 5.5* 6.8* 5.3* 6.0* 5.5*  --   CL 92*   < > 95* 95* 97* 95* 95* 95* 95*  --   CO2 22    < > 18* 26 22 20* 25 24 24   --   GLUCOSE 148*   < > 162* 133* 166* 282* 254* 337* 283*  --   BUN 140*   < > 126* 76* 100* 131* 101* 126* 113*  --   CREATININE 6.89*   < > 7.43* 5.19* 6.95* 9.18* 7.26* 8.23* 7.38*  --   CALCIUM 8.3*   < > 8.0* 7.8* 7.8* 7.3* 7.5* 7.1* 7.6*  --   MG 3.0*  --   --   --  3.0* 3.2*  --  3.2*  --  3.3*  PHOS 9.2*   < > 10.5* 6.5* 8.6*  --  8.5*  --  7.8*  --    < > = values in this interval not displayed.     CBC: Recent Labs  Lab 02/22/20 0628 02/23/20 0320 02/24/20 0402 02/25/20 0423 03-05-20 0544  WBC 15.6* 19.2* 18.0* 12.3* 15.5*  NEUTROABS 14.0* 17.2* 16.1* 11.0* 14.2*  HGB 14.7 15.2 14.7 13.3 14.1  HCT 45.7 44.6 44.2 40.4 43.1  MCV 85.7 83.2 84.7 84.3 84.5  PLT 151 199 216 165 214      Lab Results  Component Value Date   HEPBSAG NON REACTIVE 02/22/2020   HEPBSAB NON  REACTIVE 02/21/2020   HEPBIGM NON REACTIVE 02/21/2020      Microbiology:  Recent Results (from the past 240 hour(s))  Culture, blood (Routine X 2) w Reflex to ID Panel     Status: None   Collection Time: 03/01/2020  8:28 PM   Specimen: BLOOD  Result Value Ref Range Status   Specimen Description BLOOD RIGHT ANTECUBITAL  Final   Special Requests   Final    BOTTLES DRAWN AEROBIC AND ANAEROBIC Blood Culture results may not be optimal due to an excessive volume of blood received in culture bottles   Culture   Final    NO GROWTH 5 DAYS Performed at [facility], [address] [place], [place] [address]    Report Status 02/23/2020 FINAL  Final  Culture, blood (Routine X 2) w Reflex to ID Panel     Status: None   Collection Time: 02/21/2020  8:30 PM   Specimen: BLOOD  Result Value Ref Range Status   Specimen Description BLOOD BLOOD RIGHT HAND  Final   Special Requests   Final    BOTTLES DRAWN AEROBIC AND ANAEROBIC Blood Culture adequate volume   Culture   Final    NO GROWTH 5 DAYS Performed at [facility], [address] [place], [place]  [address]    Report Status 02/23/2020 FINAL  Final  MRSA PCR Screening     Status: None   Collection Time: 02/23/2020 11:42 PM   Specimen: Nasopharyngeal  Result Value Ref Range Status   MRSA by PCR NEGATIVE NEGATIVE Final    Comment:        The GeneXpert MRSA Assay (FDA approved for NASAL specimens only), is one component of a comprehensive MRSA colonization surveillance program. It is not intended to diagnose MRSA infection nor to guide or monitor treatment for MRSA infections. Performed at [facility], [address] [place], [place] [address]     Coagulation Studies: Recent Labs    02/24/20 0402 02/25/20 0423 March 17, 2020 0544  LABPROT 37.3* 39.3* 27.1*  INR 3.9* 4.2* 2.6*    Urinalysis: No results for input(s): COLORURINE, LABSPEC, PHURINE, GLUCOSEU, HGBUR, BILIRUBINUR, KETONESUR, PROTEINUR, UROBILINOGEN, NITRITE, LEUKOCYTESUR in the last 72 hours.  Invalid input(s): APPERANCEUR    Imaging: ECHOCARDIOGRAM LIMITED  Result Date: 02/25/2020    ECHOCARDIOGRAM LIMITED REPORT   Patient Name:   Bobby Lindsey Date of Exam: 02/25/2020 Medical Rec #:  [id number]       Height:       71.0 in Accession #:    [id number]      Weight:       237.9 lb Date of Birth:  1955-09-05      BSA:          2.270 m Patient Age:    65 years        BP:           116.62/62 mmHg Patient Gender: M               HR:           52 bpm. Exam Location:  [facility] Procedure: Limited Echo, Color Doppler and Cardiac Doppler Indications:     Acute myocardial infarction -unspecified I21.9  History:         Patient has prior history of Echocardiogram examinations, most                  recent 02/13/2020. Arrythmias:Atrial Fibrillation; Risk  Factors:Hypertension and Diabetes. Pulmonary embolism.  Sonographer:     Sherrie Sport RDCS (AE) Referring Phys:  [id number] CHRISTOPHER END Diagnosing Phys: Kate Sable MD  Sonographer Comments: Echo performed with patient supine and on artificial respirator, no  apical window and Technically challenging study due to limited acoustic windows. IMPRESSIONS  1. Left ventricular ejection fraction, by estimation, is 45 to 50%. The left ventricle has mildly decreased function.  2. There is no evidence of pericardial effusion. Conclusion(s)/Recommendation(s): No evidence for pericardial effusion. FINDINGS  Left Ventricle: Left ventricular ejection fraction, by estimation, is 45 to 50%. The left ventricle has mildly decreased function. Pericardium: There is no evidence of pericardial effusion. LEFT VENTRICLE PLAX 2D LVIDd:         4.55 cm LVIDs:         3.69 cm LV PW:         1.36 cm LV IVS:        1.54 cm LVOT diam:     2.10 cm LVOT Area:     3.46 cm  LEFT ATRIUM         Index LA diam:    4.80 cm 2.11 cm/m                        PULMONIC VALVE AORTA                 PV Vmax:        0.80 m/s Ao Root diam: 3.00 cm PV Peak grad:   2.6 mmHg                       RVOT Peak grad: 2 mmHg  TRICUSPID VALVE TR Peak grad:   31.8 mmHg TR Vmax:        282.00 cm/s  SHUNTS Systemic Diam: 2.10 cm Kate Sable MD Electronically signed by Kate Sable MD Signature Date/Time: 02/25/2020/3:22:56 PM    Final      Medications:   . sodium chloride Stopped (03/15/2020 2116)  . sodium chloride Stopped (02/20/20 1126)  . fentaNYL infusion INTRAVENOUS 200 mcg/hr (Mar 22, 2020 0823)  . midazolam 2 mg/hr (03/22/2020 0307)  . norepinephrine (LEVOPHED) Adult infusion Stopped (02/24/20 1808)  . phenylephrine (NEO-SYNEPHRINE) Adult infusion Stopped (02/24/20 1303)  . prismasol BGK 2/2.5 dialysis solution 1,500 mL/hr at 02/21/20 1919  . prismasol BGK 2/2.5 replacement solution 300 mL/hr at 02/21/20 1230  . prismasol BGK 2/2.5 replacement solution 300 mL/hr at 02/21/20 1230  . vasopressin 0.03 Units/min (2020/03/22 0000)   . vitamin C  500 mg Per Tube Daily  . aspirin  81 mg Per Tube Daily  . atorvastatin  80 mg Per Tube Daily  . chlorhexidine gluconate (MEDLINE KIT)  15 mL Mouth Rinse BID  .  Chlorhexidine Gluconate Cloth  6 each Topical Daily  . docusate  100 mg Per Tube BID  . feeding supplement (PROSource TF)  90 mL Per Tube QID  . feeding supplement (VITAL 1.5 CAL)  1,000 mL Per Tube Q24H  . fentaNYL (SUBLIMAZE) injection  25 mcg Intravenous Once  . folic acid  1 mg Per Tube Daily  . hydrocortisone sod succinate (SOLU-CORTEF) inj  50 mg Intravenous Q6H  . insulin aspart  0-20 Units Subcutaneous Q4H  . insulin aspart  5 Units Subcutaneous Q4H  . insulin glargine  25 Units Subcutaneous QHS  . mouth rinse  15 mL Mouth Rinse 10 times per day  . metoCLOPramide (REGLAN) injection  5  mg Intravenous Q8H  . multivitamin with minerals  1 tablet Per Tube Daily  . pantoprazole (PROTONIX) IV  40 mg Intravenous Q12H  . polyethylene glycol  17 g Per Tube Daily  . senna-docusate  1 tablet Per Tube BID  . thiamine  100 mg Per Tube Daily  . zinc sulfate  220 mg Per Tube Daily   acetaminophen, chlorpheniramine-HYDROcodone, fentaNYL, fluticasone, gabapentin, guaiFENesin-dextromethorphan, heparin, midazolam, midazolam, morphine injection, sodium chloride  Assessment/ Plan:  65 y.o. male with diabetes type 2, hypertension, hyperlipidemia, coronary artery disease, obstructive sleep apnea, atrial fibrillation, history of right leg DVT 2015 January,   admitted on 02/17/2020 for COVID-19 [U07.1] STEMI (ST elevation myocardial infarction) ([facility]) [I21.3]  #. ARF with hyperkalemia and acidosis Urinalysis from February 12, 2020 small hemoglobin, negative protein, 0-5 RBCs, 0-5 WBCs No renal imaging available at present CRRT started 02/21/2020-d/c 1/9 due to high dialyzer pressure and poor BFR  IHD on 1/11 for hyperkalemia and acidosis  1/12- repeat urgent  IHD for severe hyperkalemia 1/13- no labs. Did not tolerate IHD on 1/12  # severe hyperkalemia Lab Results  Component Value Date   K 5.5 (H) 02/25/2020     #. Acute hypoxic resp failure -intubated on 1/7 -Requiring ventilator support   -  requiring pressors  #. Covid -19 pneumonia IV remdesivir start 03/10/2020 Solu-Medrol started 02/15/2020 Completed       LOS: [place] 28-Jan-20229:28 AM  [facility] [place], [place]  Note: This note was prepared with Dragon dictation. Any transcription errors are unintentional

## 2020-03-16 NOTE — Progress Notes (Signed)
Inpatient Diabetes Program Recommendations  AACE/ADA: New Consensus Statement on Inpatient Glycemic Control   Target Ranges:  Prepandial:   less than 140 mg/dL      Peak postprandial:   less than 180 mg/dL (1-2 hours)      Critically ill patients:  140 - 180 mg/dL   Results for Bobby Lindsey, Bobby Lindsey (MRN [id number]) as of 03-08-2020 11:23  Ref. Range 02/25/2020 07:58 02/25/2020 11:04 02/25/2020 16:50 02/25/2020 19:38 08-Mar-2020 00:01 2020-03-08 03:09 03-08-20 07:33  Glucose-Capillary Latest Ref Range: 70 - 99 mg/dL 307 (H) 269 (H) 203 (H) 220 (H) 235 (H) 273 (H) 199 (H)   Review of Glycemic Control  Current orders for Inpatient glycemic control:Lantus 25 units QHS, Novolog 0-20 units Q4H, Novolog 5 units Q4H for tube feeding coverage; Solucortef 50 mg Q6H, Vital @ 40 ml/hr  Inpatient Diabetes Program Recommendations:    Insulin: Noted poor prognosis.  If appropriate given patient condition and steroids are continued as ordered, please consider increasing Lantus to 30 units QHS and tube feeding coverage to Novolog 8 units Q4H.  Thanks, Barnie Alderman, RN, MSN, CDE Diabetes Coordinator Inpatient Diabetes Program [phone] (Team Pager from 8am to 5pm)

## 2020-03-16 NOTE — Progress Notes (Addendum)
CRITICAL CARE NOTE 65 yo male recently diagnosed with COVID-19 pneumonia on 12/30 which he required hospitalization at [facility] 12/30 to 01/3 now admitted with STEMI and acute hypoxic respiratory failure secondary to COVID-19 pneumonia requiring Bipap s/p cardiac catheterization with unsuccessful PCI and stent placement to SVG per cardiology recommending uninterrupted dual antiplatelet therapy  Cardiac cath revealed multiple occlusions, however PCI and stent placement to the SVG unsuccessful per cardiology recommending uninterrupted dual antiplatelet therapy for a minimum of 6 months.  Pt admitted to ICU post procedure due to Bipap requirement secondary to acute hypoxic respiratory failure in the setting of COVID-19 pneumonia.    Significant Hospital Events:  01/5: Pt admitted to ICU post cardiac cath 1/6 TRANSFERRED TO [facility]  1/7 Progressive resp failure and renal failure, Family made aware and reversed CODE status 1/7 patient was emergently intubated and placed on MV suport 1/7 vasc cath placed in anticipation for CRRT 1/8 severe resp failure, on CRRT, on pressors 1/10 remains on pressors, almost dies last night 03-06-2022 active dying process 1/12 remains on pressors, on vent 1/13 severe ARDS, multiorgan failure       CC  follow up respiratory failure  SUBJECTIVE Patient remains critically ill Prognosis is guarded   BP 132/67   Pulse 76   Temp 99.14 F (37.3 C)   Resp 20   Ht 5' 10.98" (1.803 m)   Wt 105.7 kg   SpO2 98%   BMI 32.52 kg/m    I/O last 3 completed shifts: In: 1828.4 [I.V.:628.4; NG/GT:1200] Out: 90 [Urine:75; Other:15] No intake/output data recorded.  SpO2: 98 % O2 Flow Rate (L/min): 15 L/min FiO2 (%): (S) 100 %  Estimated body mass index is 32.52 kg/m as calculated from the following:   Height as of this encounter: 5' 10.98" (1.803 m).   Weight as of this encounter: 105.7 kg.  SIGNIFICANT EVENTS   REVIEW OF SYSTEMS  PATIENT IS  UNABLE TO PROVIDE COMPLETE REVIEW OF SYSTEMS DUE TO SEVERE CRITICAL ILLNESS      PHYSICAL EXAMINATION:  PHYSICAL EXAMINATION: GENERAL:critically ill appearing, +resp distress NECK: Supple.  PULMONARY: +rhonchi, +wheezing CARDIOVASCULAR: S1 and S2.  GASTROINTESTINAL: Soft, nontender, +Positive bowel sounds.  MUSCULOSKELETAL: No swelling, clubbing, or edema.  NEUROLOGIC: obtunded, GCS<8 SKIN:intact,warm,dry   Patient assessed or the symptoms described in the history of present illness.  In the context of the Global COVID-19 pandemic, which necessitated consideration that the patient might be at risk for infection with the SARS-CoV-2 virus that causes COVID-19, Institutional protocols and algorithms that pertain to the evaluation of patients at risk for COVID-19 are in a state of rapid change based on information released by regulatory bodies including the CDC and federal and state organizations. These policies and algorithms were followed during the patient's care while in hospital.    MEDICATIONS: I have reviewed all medications and confirmed regimen as documented   CULTURE RESULTS   Recent Results (from the past 240 hour(s))  Culture, blood (Routine X 2) w Reflex to ID Panel     Status: None   Collection Time: 02/17/2020  8:28 PM   Specimen: BLOOD  Result Value Ref Range Status   Specimen Description BLOOD RIGHT ANTECUBITAL  Final   Special Requests   Final    BOTTLES DRAWN AEROBIC AND ANAEROBIC Blood Culture results may not be optimal due to an excessive volume of blood received in culture bottles   Culture   Final    NO GROWTH 5 DAYS Performed at [facility], [address] [place], [place] [address]    Report Status 02/23/2020 FINAL  Final  Culture, blood (Routine X 2) w Reflex to ID Panel     Status: None   Collection Time: 02/14/2020  8:30 PM   Specimen: BLOOD  Result Value Ref Range Status   Specimen Description BLOOD BLOOD RIGHT HAND  Final   Special  Requests   Final    BOTTLES DRAWN AEROBIC AND ANAEROBIC Blood Culture adequate volume   Culture   Final    NO GROWTH 5 DAYS Performed at [facility], [address] [place], [place] [address]    Report Status 02/23/2020 FINAL  Final  MRSA PCR Screening     Status: None   Collection Time: 03/13/2020 11:42 PM   Specimen: Nasopharyngeal  Result Value Ref Range Status   MRSA by PCR NEGATIVE NEGATIVE Final    Comment:        The GeneXpert MRSA Assay (FDA approved for NASAL specimens only), is one component of a comprehensive MRSA colonization surveillance program. It is not intended to diagnose MRSA infection nor to guide or monitor treatment for MRSA infections. Performed at [facility], [address] [place], [place] [address]           IMAGING    ECHOCARDIOGRAM LIMITED  Result Date: 02/25/2020    ECHOCARDIOGRAM LIMITED REPORT   Patient Name:   Bobby Lindsey Date of Exam: 02/25/2020 Medical Rec #:  [id number]       Height:       71.0 in Accession #:    [id number]      Weight:       237.9 lb Date of Birth:  09-27-1955      BSA:          2.270 m Patient Age:    23 years        BP:           116.62/62 mmHg Patient Gender: M               HR:           52 bpm. Exam Location:  [facility] Procedure: Limited Echo, Color Doppler and Cardiac Doppler Indications:     Acute myocardial infarction -unspecified I21.9  History:         Patient has prior history of Echocardiogram examinations, most                  recent 02/13/2020. Arrythmias:Atrial Fibrillation; Risk                  Factors:Hypertension and Diabetes. Pulmonary embolism.  Sonographer:     Sherrie Sport RDCS (AE) Referring Phys:  [id number] CHRISTOPHER END Diagnosing Phys: Kate Sable MD  Sonographer Comments: Echo performed with patient supine and on artificial respirator, no apical window and Technically challenging study due to limited acoustic windows. IMPRESSIONS  1. Left ventricular ejection fraction, by  estimation, is 45 to 50%. The left ventricle has mildly decreased function.  2. There is no evidence of pericardial effusion. Conclusion(s)/Recommendation(s): No evidence for pericardial effusion. FINDINGS  Left Ventricle: Left ventricular ejection fraction, by estimation, is 45 to 50%. The left ventricle has mildly decreased function. Pericardium: There is no evidence of pericardial effusion. LEFT VENTRICLE PLAX 2D LVIDd:         4.55 cm LVIDs:         3.69 cm LV PW:         1.36  cm LV IVS:        1.54 cm LVOT diam:     2.10 cm LVOT Area:     3.46 cm  LEFT ATRIUM         Index LA diam:    4.80 cm 2.11 cm/m                        PULMONIC VALVE AORTA                 PV Vmax:        0.80 m/s Ao Root diam: 3.00 cm PV Peak grad:   2.6 mmHg                       RVOT Peak grad: 2 mmHg  TRICUSPID VALVE TR Peak grad:   31.8 mmHg TR Vmax:        282.00 cm/s  SHUNTS Systemic Diam: 2.10 cm Kate Sable MD Electronically signed by Kate Sable MD Signature Date/Time: 02/25/2020/3:22:56 PM    Final      Nutrition Status: Nutrition Problem: Inadequate oral intake Etiology: inability to eat Signs/Symptoms: NPO status Interventions: Tube feeding,Prostat,MVI     Indwelling Urinary Catheter continued, requirement due to   Reason to continue Indwelling Urinary Catheter strict Intake/Output monitoring for hemodynamic instability   Central Line/ continued, requirement due to  Reason to continue [place] of central venous pressure or other hemodynamic parameters and poor IV access   Ventilator continued, requirement due to severe respiratory failure   Ventilator Sedation RASS 0 to -2      ASSESSMENT AND PLAN SYNOPSIS  65 yo withAcute hypoxic respiratory failure secondary to COVID-19 pneumoniaWith Multiorgan failure, very poor prognosis,Severe end stage ischemic cardiomyopathy and sCHF complicated by acute rena failure Multiorgan failure, very poor prognosis  Severe ACUTE  Hypoxic and Hypercapnic Respiratory Failure -continue Full MV support -continue Bronchodilator Therapy -Wean Fio2 and PEEP as tolerated -VAP/VENT bundle implementation  ACUTE SYSTOLIC CARDIAC FAILURE- EF -oxygen as needed -Lasix as tolerated -follow up cardiology recs   Morbid obesity, possible OSA.   Will certainly impact respiratory mechanics, ventilator weaning Suspect will need to consider additional PEEP   ACUTE KIDNEY INJURY/Renal Failure -continue Foley Catheter-assess need -Avoid nephrotoxic agents -Follow urine output, BMP -Ensure adequate renal perfusion, optimize oxygenation -Renal dose medications HD as needed     NEUROLOGY Acute toxic metabolic encephalopathy, need for sedation Goal RASS -2 to -3  SHOCK-CARDIOGENIC -use vasopressors to keep MAP>65  CARDIAC ICU monitoring  ID -continue IV abx as prescibed -follow up cultures  GI GI PROPHYLAXIS as indicated   DIET-->TF's as tolerated Constipation protocol as indicated  ENDO - will use ICU hypoglycemic\Hyperglycemia protocol if indicated     ELECTROLYTES -follow labs as needed -replace as needed -pharmacy consultation and following   DVT/GI PRX ordered and assessed TRANSFUSIONS AS NEEDED MONITOR FSBS I Assessed the need for Labs I Assessed the need for Foley I Assessed the need for Central Venous Line Family Discussion when available I Assessed the need for Mobilization I made an Assessment of medications to be adjusted accordingly Safety Risk assessment completed   CASE DISCUSSED IN MULTIDISCIPLINARY ROUNDS WITH ICU TEAM  Critical Care Time devoted to patient care services described in this note is 46  minutes.   Overall, patient is critically ill, prognosis is guarded.  Patient with Multiorgan failure and at high risk for cardiac arrest and death.   Will need  to discuss with family regarding end of life.  Corrin Parker, M.D.  [facility]   Medical Director [place] Director [facility] Cardio-Pulmonary Department

## 2020-03-16 NOTE — Progress Notes (Signed)
Patient dsynchronous on vent despite increase in versed gtt, fentanyl gtt and fentanyl bolus. Discussed with Drue Stager, NP, received orders for 1 time dose 4mg  versed IV push.

## 2020-03-16 DEATH — deceased

## 2020-04-22 ENCOUNTER — Ambulatory Visit: Payer: Medicare HMO | Admitting: Cardiology

## 2021-07-30 IMAGING — DX DG ABD PORTABLE 1V
2 series · 2 of 2 positions shown · non-contrast
Comparison: February 21, 2020

CLINICAL DATA: Constipation

EXAM:
PORTABLE ABDOMEN - 1 VIEW

[abdomen supine (1 of 2)]
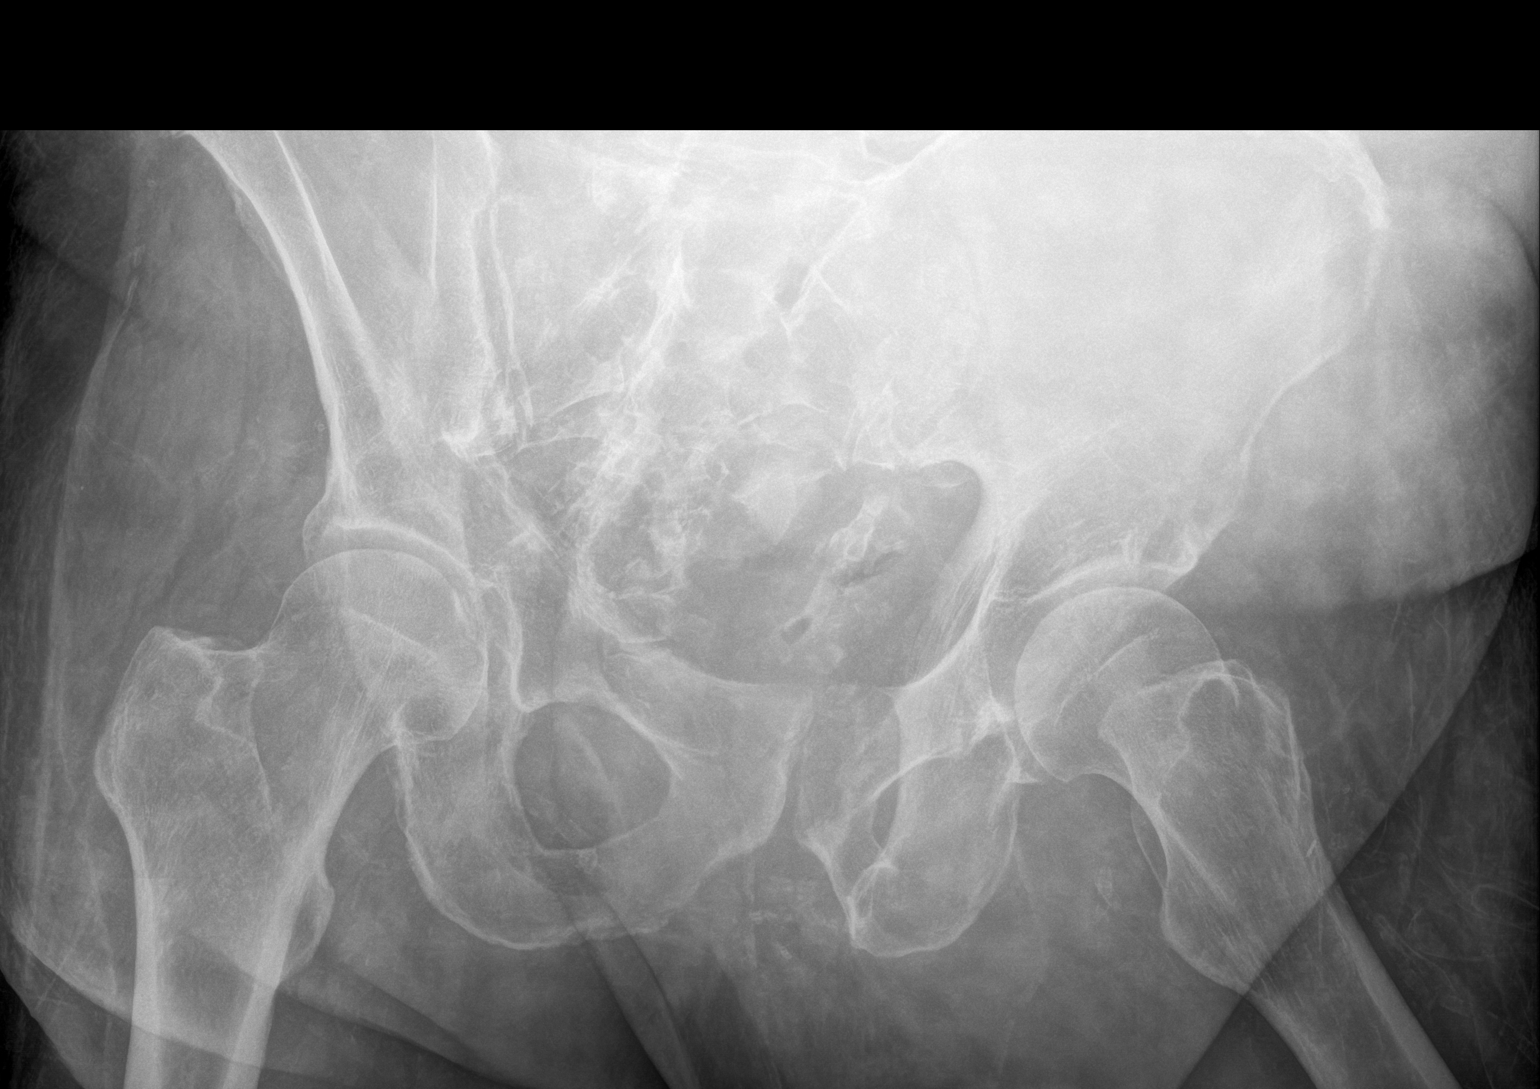

[abdomen supine (2 of 2)]
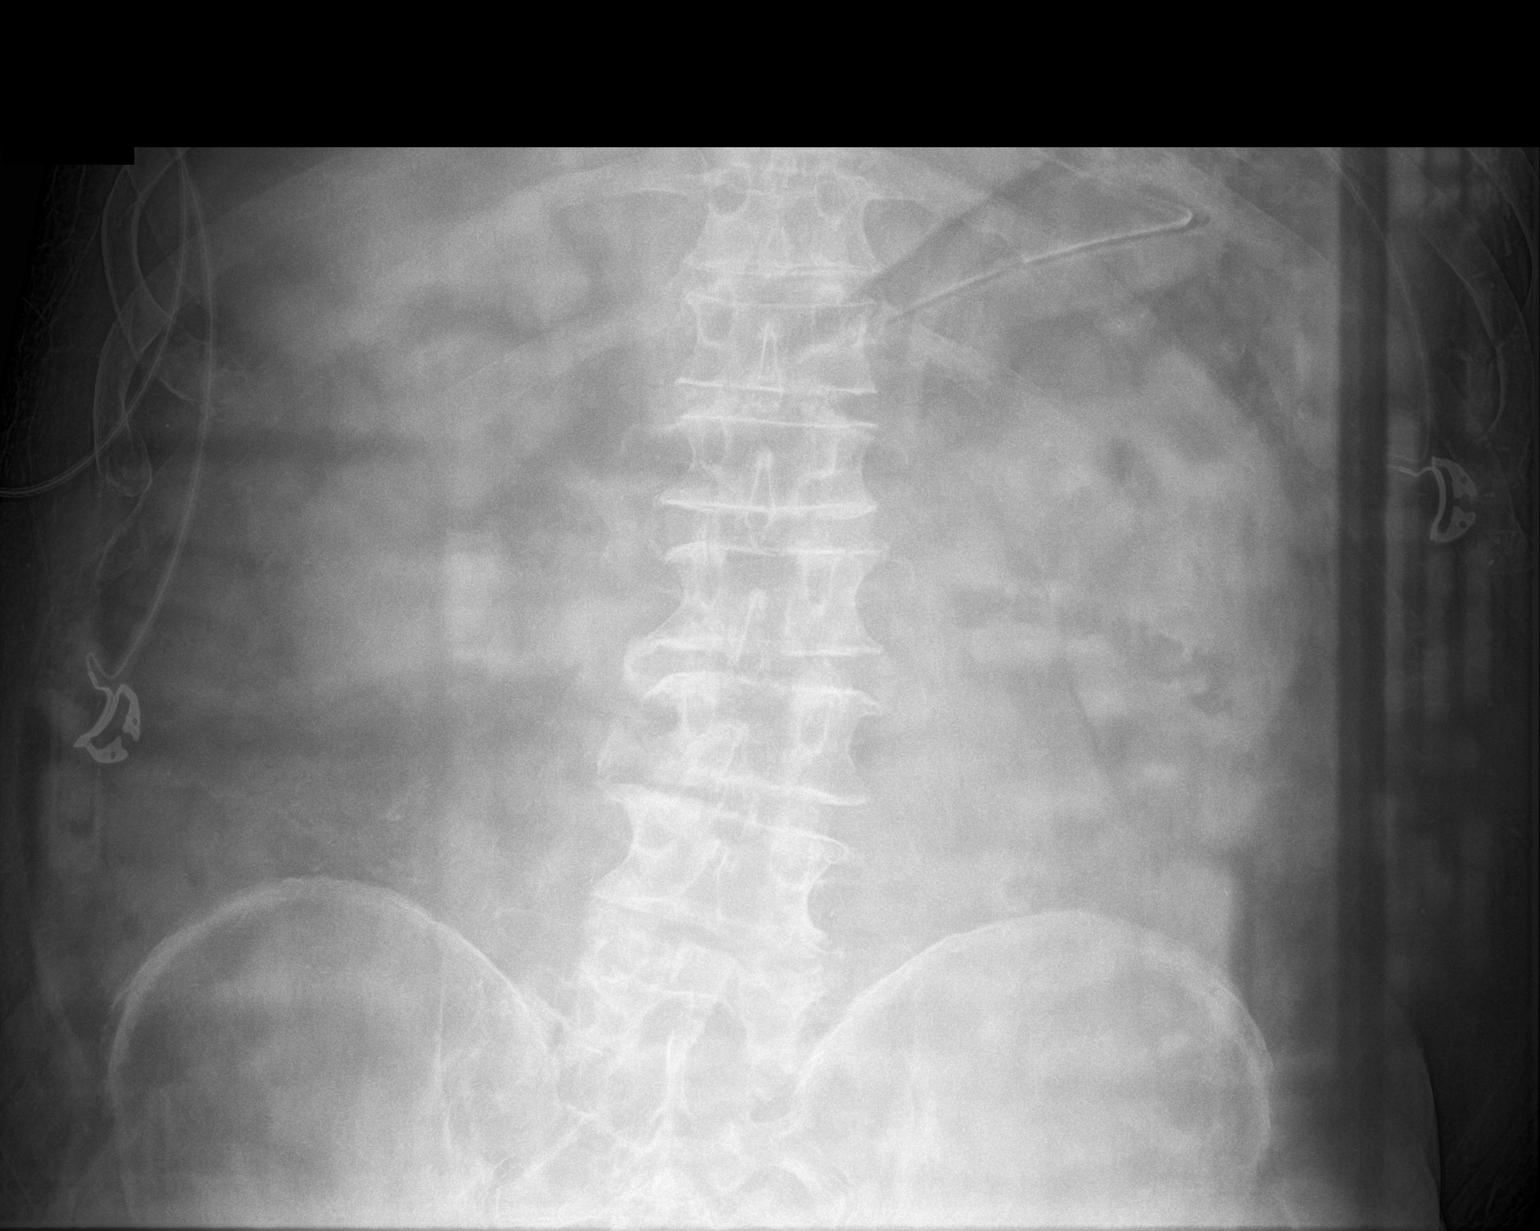

[2 of 2 positions shown; findings below may reference images not displayed]

FINDINGS: Nasogastric tube tip and side port in stomach. There is moderate
stool in the colon. There is no bowel dilatation or air-fluid level
to suggest bowel obstruction. No free air. Foci of vascular
calcification noted in the pelvis.
IMPRESSION: Moderate stool in colon. No bowel obstruction or free air.
Nasogastric tube tip and side port in stomach.
